# Patient Record
Sex: Male | Born: 1935 | Race: Black or African American | Hispanic: No | Smoking: Never smoker
Health system: Southern US, Community
[De-identification: ages and names within clinical notes are randomized; demographics above are authoritative.]

## PROBLEM LIST (undated history)

## (undated) DIAGNOSIS — E785 Hyperlipidemia, unspecified: Secondary | ICD-10-CM

## (undated) DIAGNOSIS — K219 Gastro-esophageal reflux disease without esophagitis: Secondary | ICD-10-CM

## (undated) DIAGNOSIS — J189 Pneumonia, unspecified organism: Secondary | ICD-10-CM

## (undated) DIAGNOSIS — R972 Elevated prostate specific antigen [PSA]: Secondary | ICD-10-CM

## (undated) DIAGNOSIS — E538 Deficiency of other specified B group vitamins: Secondary | ICD-10-CM

## (undated) DIAGNOSIS — D649 Anemia, unspecified: Secondary | ICD-10-CM

## (undated) DIAGNOSIS — N189 Chronic kidney disease, unspecified: Secondary | ICD-10-CM

## (undated) DIAGNOSIS — M545 Low back pain, unspecified: Secondary | ICD-10-CM

## (undated) DIAGNOSIS — R600 Localized edema: Secondary | ICD-10-CM

## (undated) DIAGNOSIS — M1990 Unspecified osteoarthritis, unspecified site: Secondary | ICD-10-CM

## (undated) DIAGNOSIS — E1039 Type 1 diabetes mellitus with other diabetic ophthalmic complication: Secondary | ICD-10-CM

## (undated) DIAGNOSIS — N400 Enlarged prostate without lower urinary tract symptoms: Secondary | ICD-10-CM

## (undated) DIAGNOSIS — N390 Urinary tract infection, site not specified: Secondary | ICD-10-CM

## (undated) DIAGNOSIS — K769 Liver disease, unspecified: Secondary | ICD-10-CM

## (undated) DIAGNOSIS — K3184 Gastroparesis: Secondary | ICD-10-CM

## (undated) DIAGNOSIS — I10 Essential (primary) hypertension: Secondary | ICD-10-CM

## (undated) DIAGNOSIS — I639 Cerebral infarction, unspecified: Secondary | ICD-10-CM

## (undated) DIAGNOSIS — G9341 Metabolic encephalopathy: Secondary | ICD-10-CM

## (undated) DIAGNOSIS — R627 Adult failure to thrive: Secondary | ICD-10-CM

## (undated) DIAGNOSIS — F329 Major depressive disorder, single episode, unspecified: Secondary | ICD-10-CM

## (undated) DIAGNOSIS — F419 Anxiety disorder, unspecified: Secondary | ICD-10-CM

## (undated) DIAGNOSIS — R4182 Altered mental status, unspecified: Secondary | ICD-10-CM

## (undated) DIAGNOSIS — E871 Hypo-osmolality and hyponatremia: Secondary | ICD-10-CM

## (undated) DIAGNOSIS — I2510 Atherosclerotic heart disease of native coronary artery without angina pectoris: Secondary | ICD-10-CM

## (undated) HISTORY — DX: Gastro-esophageal reflux disease without esophagitis: K21.9

## (undated) HISTORY — DX: Low back pain, unspecified: M54.50

## (undated) HISTORY — DX: Anemia, unspecified: D64.9

## (undated) HISTORY — DX: Elevated prostate specific antigen (PSA): R97.20

## (undated) HISTORY — DX: Gastroparesis: K31.84

## (undated) HISTORY — DX: Low back pain: M54.5

## (undated) HISTORY — DX: Deficiency of other specified B group vitamins: E53.8

## (undated) HISTORY — DX: Essential (primary) hypertension: I10

## (undated) HISTORY — DX: Unspecified osteoarthritis, unspecified site: M19.90

## (undated) HISTORY — DX: Type 1 diabetes mellitus with other diabetic ophthalmic complication: E10.39

## (undated) HISTORY — DX: Liver disease, unspecified: K76.9

## (undated) HISTORY — DX: Benign prostatic hyperplasia without lower urinary tract symptoms: N40.0

## (undated) HISTORY — DX: Major depressive disorder, single episode, unspecified: F32.9

## (undated) HISTORY — DX: Cerebral infarction, unspecified: I63.9

## (undated) HISTORY — PX: TRANSURETHRAL RESECTION OF PROSTATE: SHX73

---

## 1999-05-18 ENCOUNTER — Encounter: Payer: Self-pay | Admitting: Urology

## 1999-05-20 ENCOUNTER — Observation Stay (HOSPITAL_COMMUNITY): Admission: RE | Admit: 1999-05-20 | Discharge: 1999-05-21 | Payer: Self-pay | Admitting: Urology

## 1999-05-20 ENCOUNTER — Encounter (INDEPENDENT_AMBULATORY_CARE_PROVIDER_SITE_OTHER): Payer: Self-pay | Admitting: Specialist

## 1999-05-29 ENCOUNTER — Emergency Department (HOSPITAL_COMMUNITY): Admission: EM | Admit: 1999-05-29 | Discharge: 1999-05-29 | Payer: Self-pay | Admitting: Emergency Medicine

## 2000-10-26 ENCOUNTER — Encounter: Admission: RE | Admit: 2000-10-26 | Discharge: 2001-01-24 | Payer: Self-pay | Admitting: Endocrinology

## 2001-04-20 ENCOUNTER — Encounter: Payer: Self-pay | Admitting: Endocrinology

## 2001-04-20 ENCOUNTER — Ambulatory Visit (HOSPITAL_COMMUNITY): Admission: RE | Admit: 2001-04-20 | Discharge: 2001-04-20 | Payer: Self-pay | Admitting: Endocrinology

## 2002-11-21 ENCOUNTER — Ambulatory Visit (HOSPITAL_COMMUNITY): Admission: RE | Admit: 2002-11-21 | Discharge: 2002-11-21 | Payer: Self-pay | Admitting: Endocrinology

## 2002-11-21 ENCOUNTER — Encounter: Payer: Self-pay | Admitting: Endocrinology

## 2004-04-03 ENCOUNTER — Ambulatory Visit: Payer: Self-pay | Admitting: Internal Medicine

## 2004-08-07 ENCOUNTER — Ambulatory Visit: Payer: Self-pay | Admitting: Endocrinology

## 2004-08-14 ENCOUNTER — Ambulatory Visit: Payer: Self-pay | Admitting: Endocrinology

## 2004-09-21 ENCOUNTER — Ambulatory Visit: Payer: Self-pay | Admitting: Endocrinology

## 2004-10-14 ENCOUNTER — Ambulatory Visit: Payer: Self-pay | Admitting: Endocrinology

## 2004-10-23 ENCOUNTER — Ambulatory Visit: Payer: Self-pay | Admitting: Endocrinology

## 2004-12-22 ENCOUNTER — Ambulatory Visit: Payer: Self-pay | Admitting: Endocrinology

## 2005-03-20 ENCOUNTER — Ambulatory Visit: Payer: Self-pay | Admitting: Internal Medicine

## 2005-06-10 ENCOUNTER — Ambulatory Visit: Payer: Self-pay | Admitting: Internal Medicine

## 2005-07-02 ENCOUNTER — Encounter: Payer: Self-pay | Admitting: Internal Medicine

## 2005-07-02 ENCOUNTER — Ambulatory Visit: Payer: Self-pay

## 2005-10-08 ENCOUNTER — Ambulatory Visit: Payer: Self-pay | Admitting: Endocrinology

## 2005-10-13 ENCOUNTER — Ambulatory Visit: Payer: Self-pay | Admitting: Endocrinology

## 2005-11-22 ENCOUNTER — Ambulatory Visit: Payer: Self-pay | Admitting: Internal Medicine

## 2005-12-23 ENCOUNTER — Ambulatory Visit: Payer: Self-pay | Admitting: Internal Medicine

## 2006-01-07 ENCOUNTER — Ambulatory Visit: Payer: Self-pay | Admitting: Endocrinology

## 2006-01-12 ENCOUNTER — Ambulatory Visit: Payer: Self-pay | Admitting: Endocrinology

## 2006-02-09 ENCOUNTER — Ambulatory Visit: Payer: Self-pay | Admitting: Endocrinology

## 2006-06-13 ENCOUNTER — Ambulatory Visit: Payer: Self-pay | Admitting: Endocrinology

## 2006-06-13 LAB — CONVERTED CEMR LAB
Hgb A1c MFr Bld: 7.4 % — ABNORMAL HIGH (ref 4.6–6.0)
Uric Acid, Serum: 5.8 mg/dL (ref 2.4–7.0)

## 2006-10-04 ENCOUNTER — Ambulatory Visit: Payer: Self-pay | Admitting: Endocrinology

## 2006-10-28 ENCOUNTER — Ambulatory Visit: Payer: Self-pay | Admitting: Endocrinology

## 2006-10-28 LAB — CONVERTED CEMR LAB
ALT: 37 units/L (ref 0–53)
Bilirubin Urine: NEGATIVE
Bilirubin, Direct: 0.2 mg/dL (ref 0.0–0.3)
Calcium: 9.1 mg/dL (ref 8.4–10.5)
Creatinine,U: 124.4 mg/dL
Eosinophils Absolute: 0.1 10*3/uL (ref 0.0–0.6)
Eosinophils Relative: 2 % (ref 0.0–5.0)
GFR calc Af Amer: 58 mL/min
GFR calc non Af Amer: 48 mL/min
Glucose, Bld: 174 mg/dL — ABNORMAL HIGH (ref 70–99)
HDL: 31.9 mg/dL — ABNORMAL LOW (ref >39.0)
Hemoglobin: 12.5 g/dL — ABNORMAL LOW (ref 13.0–17.0)
Hgb A1c MFr Bld: 9.5 % — ABNORMAL HIGH (ref 4.6–6.0)
Lymphocytes Relative: 40.7 % (ref 12.0–46.0)
MCV: 94.9 fL (ref 78.0–100.0)
Microalb Creat Ratio: 4.8 mg/g (ref 0.0–30.0)
Microalb, Ur: 0.6 mg/dL (ref 0.0–1.9)
Monocytes Absolute: 0.1 10*3/uL — ABNORMAL LOW (ref 0.2–0.7)
Neutro Abs: 2.3 10*3/uL (ref 1.4–7.7)
Platelets: 169 10*3/uL (ref 150–400)
Potassium: 3.9 meq/L (ref 3.5–5.1)
Sodium: 138 meq/L (ref 135–145)
Total Protein: 7 g/dL (ref 6.0–8.3)
Triglycerides: 386 mg/dL (ref 0–149)
Urine Glucose: NEGATIVE mg/dL
WBC: 4.3 10*3/uL — ABNORMAL LOW (ref 4.5–10.5)

## 2006-11-03 ENCOUNTER — Ambulatory Visit: Payer: Self-pay | Admitting: Endocrinology

## 2006-11-03 LAB — CONVERTED CEMR LAB
Eosinophils Absolute: 0.1 10*3/uL (ref 0.0–0.6)
Hemoglobin: 10.8 g/dL — ABNORMAL LOW (ref 13.0–17.0)
Iron: 62 ug/dL (ref 42–165)
Lymphocytes Relative: 42.3 % (ref 12.0–46.0)
MCHC: 35 g/dL (ref 30.0–36.0)
MCV: 95.7 fL (ref 78.0–100.0)
Monocytes Absolute: 0.1 10*3/uL — ABNORMAL LOW (ref 0.2–0.7)
Monocytes Relative: 2.6 % — ABNORMAL LOW (ref 3.0–11.0)
Neutro Abs: 2.1 10*3/uL (ref 1.4–7.7)
Platelets: 143 10*3/uL — ABNORMAL LOW (ref 150–400)
Transferrin: 168.6 mg/dL — ABNORMAL LOW (ref >=212.0)

## 2006-12-12 ENCOUNTER — Ambulatory Visit: Payer: Self-pay | Admitting: Endocrinology

## 2007-01-31 ENCOUNTER — Ambulatory Visit: Payer: Self-pay | Admitting: Endocrinology

## 2007-02-17 ENCOUNTER — Encounter: Payer: Self-pay | Admitting: Endocrinology

## 2007-02-17 DIAGNOSIS — D649 Anemia, unspecified: Secondary | ICD-10-CM

## 2007-02-17 DIAGNOSIS — J309 Allergic rhinitis, unspecified: Secondary | ICD-10-CM | POA: Insufficient documentation

## 2007-02-17 DIAGNOSIS — K219 Gastro-esophageal reflux disease without esophagitis: Secondary | ICD-10-CM

## 2007-02-17 DIAGNOSIS — K3184 Gastroparesis: Secondary | ICD-10-CM | POA: Insufficient documentation

## 2007-02-17 DIAGNOSIS — I10 Essential (primary) hypertension: Secondary | ICD-10-CM

## 2007-02-17 DIAGNOSIS — R7989 Other specified abnormal findings of blood chemistry: Secondary | ICD-10-CM | POA: Insufficient documentation

## 2007-02-17 DIAGNOSIS — N400 Enlarged prostate without lower urinary tract symptoms: Secondary | ICD-10-CM | POA: Insufficient documentation

## 2007-02-17 DIAGNOSIS — E785 Hyperlipidemia, unspecified: Secondary | ICD-10-CM | POA: Insufficient documentation

## 2007-02-17 HISTORY — DX: Essential (primary) hypertension: I10

## 2007-02-17 HISTORY — DX: Anemia, unspecified: D64.9

## 2007-02-17 HISTORY — DX: Gastro-esophageal reflux disease without esophagitis: K21.9

## 2007-03-20 ENCOUNTER — Ambulatory Visit: Payer: Self-pay | Admitting: Endocrinology

## 2007-04-18 ENCOUNTER — Ambulatory Visit: Payer: Self-pay | Admitting: Endocrinology

## 2007-04-18 DIAGNOSIS — R05 Cough: Secondary | ICD-10-CM

## 2007-04-27 ENCOUNTER — Ambulatory Visit: Payer: Self-pay | Admitting: Endocrinology

## 2007-06-28 ENCOUNTER — Ambulatory Visit: Payer: Self-pay | Admitting: Endocrinology

## 2007-07-31 ENCOUNTER — Emergency Department (HOSPITAL_COMMUNITY): Admission: EM | Admit: 2007-07-31 | Discharge: 2007-07-31 | Payer: Self-pay | Admitting: Emergency Medicine

## 2007-08-11 ENCOUNTER — Ambulatory Visit: Payer: Self-pay | Admitting: Endocrinology

## 2007-08-18 ENCOUNTER — Ambulatory Visit: Payer: Self-pay | Admitting: Endocrinology

## 2007-08-18 DIAGNOSIS — M25569 Pain in unspecified knee: Secondary | ICD-10-CM

## 2007-09-26 ENCOUNTER — Ambulatory Visit: Payer: Self-pay | Admitting: Endocrinology

## 2007-10-26 ENCOUNTER — Ambulatory Visit: Payer: Self-pay | Admitting: Endocrinology

## 2007-11-22 ENCOUNTER — Encounter: Payer: Self-pay | Admitting: Endocrinology

## 2007-12-26 ENCOUNTER — Ambulatory Visit: Payer: Self-pay | Admitting: Endocrinology

## 2007-12-26 DIAGNOSIS — M79609 Pain in unspecified limb: Secondary | ICD-10-CM | POA: Insufficient documentation

## 2007-12-28 ENCOUNTER — Encounter (INDEPENDENT_AMBULATORY_CARE_PROVIDER_SITE_OTHER): Payer: Self-pay | Admitting: *Deleted

## 2007-12-28 ENCOUNTER — Telehealth: Payer: Self-pay | Admitting: Endocrinology

## 2008-01-03 ENCOUNTER — Encounter: Payer: Self-pay | Admitting: Endocrinology

## 2008-01-03 ENCOUNTER — Ambulatory Visit: Payer: Self-pay

## 2008-02-26 ENCOUNTER — Ambulatory Visit: Payer: Self-pay | Admitting: Endocrinology

## 2008-04-04 ENCOUNTER — Ambulatory Visit: Payer: Self-pay | Admitting: Endocrinology

## 2008-04-29 ENCOUNTER — Telehealth (INDEPENDENT_AMBULATORY_CARE_PROVIDER_SITE_OTHER): Payer: Self-pay | Admitting: *Deleted

## 2008-06-06 ENCOUNTER — Telehealth (INDEPENDENT_AMBULATORY_CARE_PROVIDER_SITE_OTHER): Payer: Self-pay | Admitting: *Deleted

## 2008-06-26 ENCOUNTER — Encounter: Payer: Self-pay | Admitting: Endocrinology

## 2008-07-08 ENCOUNTER — Ambulatory Visit: Payer: Self-pay | Admitting: Endocrinology

## 2008-08-01 ENCOUNTER — Encounter: Payer: Self-pay | Admitting: Endocrinology

## 2008-08-02 ENCOUNTER — Telehealth: Payer: Self-pay | Admitting: Endocrinology

## 2008-08-07 ENCOUNTER — Ambulatory Visit: Payer: Self-pay | Admitting: Endocrinology

## 2008-09-02 ENCOUNTER — Encounter: Payer: Self-pay | Admitting: Endocrinology

## 2008-10-03 ENCOUNTER — Ambulatory Visit: Payer: Self-pay | Admitting: Internal Medicine

## 2008-10-03 DIAGNOSIS — R300 Dysuria: Secondary | ICD-10-CM

## 2008-10-03 DIAGNOSIS — B3749 Other urogenital candidiasis: Secondary | ICD-10-CM

## 2008-10-03 LAB — CONVERTED CEMR LAB
Albumin: 3.6 g/dL (ref 3.5–5.2)
BUN: 14 mg/dL (ref 6–23)
Basophils Absolute: 0 10*3/uL (ref 0.0–0.1)
CO2: 25 meq/L (ref 19–32)
Calcium: 9 mg/dL (ref 8.4–10.5)
Eosinophils Absolute: 0.1 10*3/uL (ref 0.0–0.7)
Glucose, Bld: 179 mg/dL — ABNORMAL HIGH (ref 70–99)
Glucose, Urine, Semiquant: >=1000
HCT: 37.5 % — ABNORMAL LOW (ref 39.0–52.0)
Hemoglobin: 13 g/dL (ref 13.0–17.0)
Hgb A1c MFr Bld: 10.1 % — ABNORMAL HIGH (ref 4.6–6.5)
Iron: 65 ug/dL (ref 42–165)
Lymphs Abs: 1.5 10*3/uL (ref 0.7–4.0)
MCHC: 34.6 g/dL (ref 30.0–36.0)
Neutro Abs: 2.5 10*3/uL (ref 1.4–7.7)
Nitrite: NEGATIVE
PSA: 4.01 ng/mL — ABNORMAL HIGH (ref 0.10–4.00)
Potassium: 3.7 meq/L (ref 3.5–5.1)
RDW: 12.8 % (ref 11.5–14.6)
Sodium: 138 meq/L (ref 135–145)
TSH: 2.13 microintl units/mL (ref 0.35–5.50)
Urine Glucose: >=1000 mg/dL
Urobilinogen, UA: 0.2
Urobilinogen, UA: 0.2 (ref 0.0–1.0)
WBC Urine, dipstick: NEGATIVE

## 2008-10-04 ENCOUNTER — Encounter: Payer: Self-pay | Admitting: Internal Medicine

## 2008-10-08 ENCOUNTER — Ambulatory Visit: Payer: Self-pay | Admitting: Endocrinology

## 2008-10-08 DIAGNOSIS — D61818 Other pancytopenia: Secondary | ICD-10-CM | POA: Insufficient documentation

## 2008-10-08 DIAGNOSIS — E1065 Type 1 diabetes mellitus with hyperglycemia: Secondary | ICD-10-CM

## 2008-10-08 DIAGNOSIS — R972 Elevated prostate specific antigen [PSA]: Secondary | ICD-10-CM | POA: Insufficient documentation

## 2008-10-08 DIAGNOSIS — E1039 Type 1 diabetes mellitus with other diabetic ophthalmic complication: Secondary | ICD-10-CM

## 2008-10-18 ENCOUNTER — Telehealth: Payer: Self-pay | Admitting: Endocrinology

## 2008-10-24 ENCOUNTER — Ambulatory Visit: Payer: Self-pay | Admitting: Endocrinology

## 2008-10-24 DIAGNOSIS — E538 Deficiency of other specified B group vitamins: Secondary | ICD-10-CM | POA: Insufficient documentation

## 2008-11-01 ENCOUNTER — Encounter: Payer: Self-pay | Admitting: Endocrinology

## 2008-11-11 ENCOUNTER — Encounter: Payer: Self-pay | Admitting: Endocrinology

## 2008-11-17 ENCOUNTER — Encounter: Payer: Self-pay | Admitting: Endocrinology

## 2008-11-20 ENCOUNTER — Encounter: Payer: Self-pay | Admitting: Endocrinology

## 2008-11-21 ENCOUNTER — Encounter: Payer: Self-pay | Admitting: Endocrinology

## 2008-12-24 ENCOUNTER — Emergency Department (HOSPITAL_COMMUNITY): Admission: EM | Admit: 2008-12-24 | Discharge: 2008-12-24 | Payer: Self-pay | Admitting: Emergency Medicine

## 2008-12-25 ENCOUNTER — Ambulatory Visit: Payer: Self-pay | Admitting: Internal Medicine

## 2008-12-25 DIAGNOSIS — F3289 Other specified depressive episodes: Secondary | ICD-10-CM

## 2008-12-25 DIAGNOSIS — F329 Major depressive disorder, single episode, unspecified: Secondary | ICD-10-CM | POA: Insufficient documentation

## 2008-12-25 DIAGNOSIS — R040 Epistaxis: Secondary | ICD-10-CM | POA: Insufficient documentation

## 2008-12-25 DIAGNOSIS — R634 Abnormal weight loss: Secondary | ICD-10-CM | POA: Insufficient documentation

## 2008-12-25 HISTORY — DX: Other specified depressive episodes: F32.89

## 2008-12-25 HISTORY — DX: Major depressive disorder, single episode, unspecified: F32.9

## 2008-12-26 LAB — CONVERTED CEMR LAB
ALT: 27 units/L (ref 0–53)
AST: 32 units/L (ref 0–37)
Albumin: 3.6 g/dL (ref 3.5–5.2)
Alkaline Phosphatase: 85 units/L (ref 39–117)
Basophils Relative: 0.8 % (ref 0.0–3.0)
CO2: 27 meq/L (ref 19–32)
Calcium: 9 mg/dL (ref 8.4–10.5)
Eosinophils Absolute: 0 10*3/uL (ref 0.0–0.7)
Eosinophils Relative: 1.2 % (ref 0.0–5.0)
Hemoglobin: 12.4 g/dL — ABNORMAL LOW (ref 13.0–17.0)
Ketones, ur: NEGATIVE mg/dL
Lymphocytes Relative: 41.3 % (ref 12.0–46.0)
MCHC: 33.9 g/dL (ref 30.0–36.0)
Monocytes Relative: 2.1 % — ABNORMAL LOW (ref 3.0–12.0)
Neutro Abs: 2.1 10*3/uL (ref 1.4–7.7)
RBC: 3.72 M/uL — ABNORMAL LOW (ref 4.22–5.81)
Sodium: 135 meq/L (ref 135–145)
Specific Gravity, Urine: 1.015 (ref 1.000–1.030)
Total Protein, Urine: NEGATIVE mg/dL
Total Protein: 7.3 g/dL (ref 6.0–8.3)
Urine Glucose: >=1000 mg/dL
pH: 5.5 (ref 5.0–8.0)

## 2008-12-30 ENCOUNTER — Ambulatory Visit: Payer: Self-pay | Admitting: Endocrinology

## 2008-12-30 DIAGNOSIS — R9431 Abnormal electrocardiogram [ECG] [EKG]: Secondary | ICD-10-CM

## 2008-12-30 DIAGNOSIS — M25529 Pain in unspecified elbow: Secondary | ICD-10-CM

## 2009-01-08 ENCOUNTER — Encounter: Payer: Self-pay | Admitting: Endocrinology

## 2009-01-08 ENCOUNTER — Ambulatory Visit: Payer: Self-pay | Admitting: Endocrinology

## 2009-01-08 ENCOUNTER — Ambulatory Visit: Payer: Self-pay

## 2009-01-08 DIAGNOSIS — R55 Syncope and collapse: Secondary | ICD-10-CM | POA: Insufficient documentation

## 2009-01-13 ENCOUNTER — Emergency Department (HOSPITAL_COMMUNITY): Admission: EM | Admit: 2009-01-13 | Discharge: 2009-01-14 | Payer: Self-pay | Admitting: Emergency Medicine

## 2009-01-31 ENCOUNTER — Ambulatory Visit: Payer: Self-pay | Admitting: Endocrinology

## 2009-01-31 DIAGNOSIS — R109 Unspecified abdominal pain: Secondary | ICD-10-CM | POA: Insufficient documentation

## 2009-01-31 LAB — CONVERTED CEMR LAB: Rapid Strep: NEGATIVE

## 2009-02-03 LAB — CONVERTED CEMR LAB
Bilirubin Urine: NEGATIVE
Eosinophils Relative: 1.7 % (ref 0.0–5.0)
HCT: 40.6 % (ref 39.0–52.0)
Hemoglobin: 13.8 g/dL (ref 13.0–17.0)
Leukocytes, UA: NEGATIVE
Lymphocytes Relative: 38 % (ref 12.0–46.0)
Lymphs Abs: 1.5 10*3/uL (ref 0.7–4.0)
Monocytes Relative: 1.9 % — ABNORMAL LOW (ref 3.0–12.0)
Nitrite: NEGATIVE
Platelets: 196 10*3/uL (ref 150.0–400.0)
Specific Gravity, Urine: 1.015 (ref 1.000–1.030)
Total Protein, Urine: NEGATIVE mg/dL
WBC: 4 10*3/uL — ABNORMAL LOW (ref 4.5–10.5)
pH: 5.5 (ref 5.0–8.0)

## 2009-02-08 ENCOUNTER — Ambulatory Visit: Payer: Self-pay | Admitting: Internal Medicine

## 2009-02-08 DIAGNOSIS — J029 Acute pharyngitis, unspecified: Secondary | ICD-10-CM

## 2009-02-09 ENCOUNTER — Emergency Department (HOSPITAL_COMMUNITY): Admission: EM | Admit: 2009-02-09 | Discharge: 2009-02-09 | Payer: Self-pay | Admitting: Emergency Medicine

## 2009-02-28 ENCOUNTER — Ambulatory Visit: Payer: Self-pay | Admitting: Endocrinology

## 2009-03-17 ENCOUNTER — Ambulatory Visit: Payer: Self-pay | Admitting: Endocrinology

## 2009-03-17 LAB — CONVERTED CEMR LAB: Hgb A1c MFr Bld: 9.6 % — ABNORMAL HIGH (ref 4.6–6.5)

## 2009-04-04 ENCOUNTER — Telehealth: Payer: Self-pay | Admitting: Internal Medicine

## 2009-04-11 ENCOUNTER — Telehealth: Payer: Self-pay | Admitting: Family Medicine

## 2009-04-14 ENCOUNTER — Telehealth: Payer: Self-pay | Admitting: Endocrinology

## 2009-05-27 ENCOUNTER — Telehealth (INDEPENDENT_AMBULATORY_CARE_PROVIDER_SITE_OTHER): Payer: Self-pay | Admitting: *Deleted

## 2009-05-28 ENCOUNTER — Encounter: Payer: Self-pay | Admitting: Endocrinology

## 2009-06-16 ENCOUNTER — Ambulatory Visit: Payer: Self-pay | Admitting: Endocrinology

## 2009-06-25 ENCOUNTER — Telehealth: Payer: Self-pay | Admitting: Endocrinology

## 2009-09-09 ENCOUNTER — Ambulatory Visit: Payer: Self-pay | Admitting: Endocrinology

## 2009-09-09 DIAGNOSIS — M545 Low back pain: Secondary | ICD-10-CM

## 2009-09-09 LAB — CONVERTED CEMR LAB: Hgb A1c MFr Bld: 11.6 % — ABNORMAL HIGH

## 2009-11-20 ENCOUNTER — Encounter: Payer: Self-pay | Admitting: Endocrinology

## 2009-11-20 LAB — HM DIABETES EYE EXAM

## 2009-12-09 ENCOUNTER — Ambulatory Visit: Payer: Self-pay | Admitting: Endocrinology

## 2009-12-09 DIAGNOSIS — K769 Liver disease, unspecified: Secondary | ICD-10-CM | POA: Insufficient documentation

## 2009-12-09 LAB — CONVERTED CEMR LAB
ALT: 61 units/L — ABNORMAL HIGH (ref 0–53)
AST: 90 units/L — ABNORMAL HIGH (ref 0–37)
Basophils Relative: 0.5 % (ref 0.0–3.0)
Chloride: 104 meq/L (ref 96–112)
Cholesterol: 101 mg/dL (ref 0–200)
Eosinophils Relative: 2.1 % (ref 0.0–5.0)
GFR calc non Af Amer: 85.62 mL/min (ref >60)
HCT: 41.7 % (ref 39.0–52.0)
Hemoglobin: 14.4 g/dL (ref 13.0–17.0)
Hgb A1c MFr Bld: 11.2 % — ABNORMAL HIGH (ref 4.6–6.5)
LDL Cholesterol: 57 mg/dL (ref 0–99)
Leukocytes, UA: NEGATIVE
Lymphs Abs: 1.6 10*3/uL (ref 0.7–4.0)
MCV: 97.1 fL (ref 78.0–100.0)
Microalb Creat Ratio: 12 mg/g (ref 0.0–30.0)
Monocytes Absolute: 0.1 10*3/uL (ref 0.1–1.0)
Monocytes Relative: 2.1 % — ABNORMAL LOW (ref 3.0–12.0)
PSA: 2.94 ng/mL (ref 0.10–4.00)
Platelets: 153 10*3/uL (ref 150.0–400.0)
Potassium: 4 meq/L (ref 3.5–5.1)
RBC: 4.29 M/uL (ref 4.22–5.81)
Sodium: 138 meq/L (ref 135–145)
Specific Gravity, Urine: 1.02 (ref 1.000–1.030)
TSH: 1.8 microintl units/mL (ref 0.35–5.50)
Total Bilirubin: 0.9 mg/dL (ref 0.3–1.2)
Total CHOL/HDL Ratio: 3
Total Protein: 6.9 g/dL (ref 6.0–8.3)
Uric Acid, Serum: 4.6 mg/dL (ref 4.0–7.8)
Urobilinogen, UA: 0.2 (ref 0.0–1.0)
VLDL: 8 mg/dL (ref 0.0–40.0)
WBC: 3.9 10*3/uL — ABNORMAL LOW (ref 4.5–10.5)
pH: 5.5 (ref 5.0–8.0)

## 2010-01-02 ENCOUNTER — Ambulatory Visit: Payer: Self-pay | Admitting: Endocrinology

## 2010-01-08 ENCOUNTER — Encounter: Payer: Self-pay | Admitting: Endocrinology

## 2010-01-08 ENCOUNTER — Ambulatory Visit: Payer: Self-pay | Admitting: Endocrinology

## 2010-01-08 DIAGNOSIS — R319 Hematuria, unspecified: Secondary | ICD-10-CM

## 2010-01-08 LAB — CONVERTED CEMR LAB: HCV Ab: NEGATIVE

## 2010-01-18 ENCOUNTER — Emergency Department (HOSPITAL_COMMUNITY): Admission: EM | Admit: 2010-01-18 | Discharge: 2010-01-18 | Payer: Self-pay | Admitting: Emergency Medicine

## 2010-02-05 ENCOUNTER — Encounter: Payer: Self-pay | Admitting: Endocrinology

## 2010-02-16 ENCOUNTER — Ambulatory Visit: Payer: Self-pay | Admitting: Endocrinology

## 2010-03-04 ENCOUNTER — Ambulatory Visit: Payer: Self-pay | Admitting: Endocrinology

## 2010-03-04 DIAGNOSIS — M1990 Unspecified osteoarthritis, unspecified site: Secondary | ICD-10-CM | POA: Insufficient documentation

## 2010-03-04 HISTORY — DX: Unspecified osteoarthritis, unspecified site: M19.90

## 2010-03-04 LAB — CONVERTED CEMR LAB: Hgb A1c MFr Bld: 10.5 % — ABNORMAL HIGH (ref 4.6–6.5)

## 2010-03-09 ENCOUNTER — Encounter: Payer: Self-pay | Admitting: Endocrinology

## 2010-03-26 ENCOUNTER — Encounter: Payer: Self-pay | Admitting: Endocrinology

## 2010-03-30 ENCOUNTER — Telehealth: Payer: Self-pay | Admitting: Endocrinology

## 2010-04-22 ENCOUNTER — Ambulatory Visit
Admission: RE | Admit: 2010-04-22 | Discharge: 2010-04-22 | Payer: Self-pay | Source: Home / Self Care | Attending: Endocrinology | Admitting: Endocrinology

## 2010-04-30 ENCOUNTER — Encounter: Payer: Self-pay | Admitting: Endocrinology

## 2010-05-01 ENCOUNTER — Ambulatory Visit: Admission: RE | Admit: 2010-05-01 | Discharge: 2010-05-01 | Payer: Self-pay | Source: Home / Self Care

## 2010-05-01 ENCOUNTER — Encounter: Payer: Self-pay | Admitting: Endocrinology

## 2010-05-07 ENCOUNTER — Encounter: Payer: Self-pay | Admitting: Endocrinology

## 2010-05-08 ENCOUNTER — Ambulatory Visit: Admit: 2010-05-08 | Payer: Self-pay | Admitting: Endocrinology

## 2010-05-15 ENCOUNTER — Other Ambulatory Visit: Payer: Self-pay | Admitting: Endocrinology

## 2010-05-15 ENCOUNTER — Ambulatory Visit
Admission: RE | Admit: 2010-05-15 | Discharge: 2010-05-15 | Payer: Self-pay | Source: Home / Self Care | Attending: Endocrinology | Admitting: Endocrinology

## 2010-05-15 LAB — HEMOGLOBIN A1C: Hgb A1c MFr Bld: 11 % — ABNORMAL HIGH (ref 4.6–6.5)

## 2010-05-17 LAB — CONVERTED CEMR LAB
ALT: 23 units/L (ref 0–53)
ALT: 24 units/L (ref 0–53)
AST: 22 units/L (ref 0–37)
Albumin: 3.5 g/dL (ref 3.5–5.2)
Alkaline Phosphatase: 89 units/L (ref 39–117)
Basophils Absolute: 0 10*3/uL (ref 0.0–0.1)
Bilirubin Urine: NEGATIVE
Bilirubin Urine: NEGATIVE
Bilirubin, Direct: 0.2 mg/dL (ref 0.0–0.3)
CO2: 27 meq/L (ref 19–32)
Calcium: 8.9 mg/dL (ref 8.4–10.5)
Cholesterol: 163 mg/dL (ref 0–200)
Creatinine, Ser: 1.7 mg/dL — ABNORMAL HIGH (ref 0.4–1.5)
Creatinine,U: 118.1 mg/dL
Crystals: NEGATIVE
GFR calc Af Amer: 50 mL/min
GFR calc non Af Amer: 41 mL/min
HCT: 36.9 % — ABNORMAL LOW (ref 39.0–52.0)
HDL: 33.9 mg/dL — ABNORMAL LOW (ref >39.00)
HDL: 43.2 mg/dL (ref >39.0)
Hemoglobin: 12.6 g/dL — ABNORMAL LOW (ref 13.0–17.0)
Ketones, ur: NEGATIVE mg/dL
LDL Cholesterol: 75 mg/dL (ref 0–99)
LDL Cholesterol: 99 mg/dL (ref 0–99)
Leukocytes, UA: NEGATIVE
Leukocytes, UA: NEGATIVE
Lymphocytes Relative: 48.5 % — ABNORMAL HIGH (ref 12.0–46.0)
MCHC: 34.2 g/dL (ref 30.0–36.0)
Microalb Creat Ratio: 21.6 mg/g (ref 0.0–30.0)
Microalb Creat Ratio: 5.1 mg/g (ref 0.0–30.0)
Microalb, Ur: 0.6 mg/dL (ref 0.0–1.9)
Monocytes Absolute: 0.1 10*3/uL (ref 0.1–1.0)
Monocytes Relative: 2.6 % — ABNORMAL LOW (ref 3.0–12.0)
Neutro Abs: 1.9 10*3/uL (ref 1.4–7.7)
Nitrite: NEGATIVE
Platelets: 172 10*3/uL (ref 150–400)
Prolactin: 9.6 ng/mL
RDW: 12.8 % (ref 11.5–14.6)
Sodium: 142 meq/L (ref 135–145)
Total Bilirubin: 1.4 mg/dL — ABNORMAL HIGH (ref 0.3–1.2)
Total CHOL/HDL Ratio: 3.8
Total CHOL/HDL Ratio: 4
Total Protein, Urine: NEGATIVE mg/dL
Total Protein: 7 g/dL (ref 6.0–8.3)
Triglycerides: 106 mg/dL (ref 0–149)
Triglycerides: 73 mg/dL (ref 0.0–149.0)
Uric Acid, Serum: 6.5 mg/dL (ref 4.0–7.8)
Urobilinogen, UA: 0.2 (ref 0.0–1.0)
Urobilinogen, UA: 0.2 (ref 0.0–1.0)
WBC, UA: NONE SEEN cells/hpf

## 2010-05-19 NOTE — Medication Information (Signed)
Summary: Diabetic supplies/[facility]  Diabetic supplies/[facility]   Imported By: Bubba Hales 05/29/2009 07:40:29  _____________________________________________________________________  External Attachment:    Type:   Image     Comment:   External Document

## 2010-05-19 NOTE — Letter (Signed)
Summary: [facility]   Imported By: Phillis Knack 02/12/2010 08:11:13  _____________________________________________________________________  External Attachment:    Type:   Image     Comment:   External Document

## 2010-05-19 NOTE — Assessment & Plan Note (Signed)
Summary: 3 MONTH FU-LB   Vital Signs:  Patient profile:   77 year old male Height:      71 inches (180.34 cm) Weight:      162.25 pounds (73.75 kg) O2 Sat:      98 % on Room air Temp:     96.4 degrees F (35.78 degrees C) oral Pulse rate:   59 / minute BP sitting:   138 / 88  (left arm) Cuff size:   regular  Vitals Entered By: Gardenia Phlegm RMA (June 16, 2009 8:35 AM)  O2 Flow:  Room air CC: 3 month follow up/ CF Is Patient Diabetic? Yes   Primary Provider:  Donavan Foil MD  CC:  3 month follow up/ CF.  History of Present Illness: pt states 1-2 weeks of rash on the foreskin of the penis.  no associated itching. no cbg record, but states cbg's are well-controlled.  he says his cbg is highest at hs and am.  Current Medications (verified): 1)  Aciphex 20 Mg Tbec (Rabeprazole Sodium) .... Take 1 Tablet By Mouth Once A Day 2)  Furosemide 80 Mg Tabs (Furosemide) .... Take 1 By Mouth Qd 3)  Lactulose 10 Gm/38ml Soln (Lactulose) .... Take 15 Ml By Mouth Twice A Day Prn 4)  Lisinopril-Hydrochlorothiazide 20-12.5 Mg Tabs (Lisinopril-Hydrochlorothiazide) .... Take 2 By Mouth Qd 5)  Norvasc 10 Mg  Tabs (Amlodipine Besylate) .... Take 1 By Mouth Qd 6)  Bd Insulin Syringe Ultrafine 31g X 5/16" 0.3 Ml  Misc (Insulin Syringe-Needle U-100) .... Use As Directed 7)  Aspirin Adult Low Strength 81 Mg Tbec (Aspirin) .... Take 1 By Mouth Qd 8)  Cozaar 100 Mg Tabs (Losartan Potassium) .... Qd 9)  Simvastatin 80 Mg Tabs (Simvastatin) .... Qhs 10)  Novolin 70/30 70-30 % Susp (Insulin Isophane & Regular) .... 22 Units Qam and 4 Units Qpm 11)  Lotrimin Af 1 % Crea (Clotrimazole) .... Clean Under Foreskin and Apply To Eip of Penis Two Times A Day For 10 Days 12)  Carvedilol 3.125 Mg Tabs (Carvedilol) .Marland Kitchen.. 1 Bid 13)  Wellbutrin Xl 150 Mg Xr24h-Tab (Bupropion Hcl) .Marland Kitchen.. 1 By Mouth Once Daily (Generic) 14)  Tramadol Hcl 50 Mg Tabs (Tramadol Hcl) .Marland Kitchen.. 1 Q4h As Needed Pain 15)  Bayer Contour Test   Strp (Glucose Blood) .Marland Kitchen.. 1 Two Times A Day, and Lancets 250.01  Allergies (verified): No Known Drug Allergies  Past History:  Past Medical History: Last updated: 02/17/2007 Allergic rhinitis Anemia-NOS Diabetes mellitus, type II GERD Hyperlipidemia Hypertension Benign prostatic hypertrophy  Review of Systems  The patient denies hypoglycemia and fever.    Physical Exam  General:  normal appearance.   Genitalia:  uncircumcised.  there is an eczematous rash at the foreskin Additional Exam:   Hemoglobin A1C       [H]  10.9 %    Impression & Recommendations:  Problem # 1:  DIABETES MELLITUS, TYPE I, CONTROLLED, WITH RETINOPATHY (ICD-250.51) needs increased rx  Medications Added to Medication List This Visit: 1)  Novolin 70/30 70-30 % Susp (Insulin isophane & regular) .... 22 units qam and 8 units qpm 2)  Fluconazole 100 Mg Tabs (Fluconazole) .Marland Kitchen.. 1 once daily, x 2 days  Other Orders: TLB-A1C / Hgb A1C (Glycohemoglobin) (83036-A1C) Est. Patient Level III [id number])  Patient Instructions: 1)  fluconazole 100 mg once daily, x 2 days. 2)  tests are being ordered for you today.  a few days after the test(s), please call [phone] to hear your test  results. 3)  pending the test results, please continue the same medications (except the fluconazole), for now. 4)  while on the fluconazole, do not take the simvastatin, due to a drug interaction. 5)  Please schedule a follow-up appointment in 3 months. 6)  (update: i left message on phone-tree:  increase insulin to 22 units am and 8 units pm.  return 2 weeks with cbg record) Prescriptions: LACTULOSE 10 GM/15ML SOLN (LACTULOSE) Take 15 ml by mouth twice a day prn  #900 Millilit x 11   Entered and Authorized by:   Donavan Foil MD   Signed by:   Donavan Foil MD on 06/16/2009   Method used:   Electronically to        [facility] [phone]* (retail)       [place], [place]   [id number]       Ph: [phone] or [phone]       Fax: [phone]   RxID:   [id number] FLUCONAZOLE 100 MG TABS (FLUCONAZOLE) 1 once daily, x 2 days  #2 x 4   Entered and Authorized by:   Donavan Foil MD   Signed by:   Donavan Foil MD on 06/16/2009   Method used:   Electronically to        [facility] [phone]* (retail)       [place], [place]  [id number]       Ph: [phone] or [phone]       Fax: [phone]   RxID:   [id number]   Appended Document: Orders Update     Clinical Lists Changes  Orders: Added new Service order of Est. Patient Level IV [id number]) - Signed

## 2010-05-19 NOTE — Assessment & Plan Note (Signed)
Summary: GOUT? COMING 9-22 FOR YEARLY FU/NWS   Vital Signs:  Patient profile:   77 year old male Height:      71 inches (180.34 cm) Weight:      157 pounds (71.36 kg) BMI:     21.98 O2 Sat:      99 % on Room air Temp:     97.4 degrees F (36.33 degrees C) oral Pulse rate:   53 / minute BP sitting:   116 / 72  (left arm) Cuff size:   regular  Vitals Entered By: Rebeca Alert MA (January 02, 2010 10:47 AM)  O2 Flow:  Room air CC: gout sx/pain in toes, swollen feet/aj Is Patient Diabetic? Yes   Primary Provider:  Donavan Foil MD  CC:  gout sx/pain in toes and swollen feet/aj.  History of Present Illness: pt states few days of mod pain at both feet, and assoc swelling.  it is worst at both great toe mtp areas.    Current Medications (verified): 1)  Aciphex 20 Mg Tbec (Rabeprazole Sodium) .... Take 1 Tablet By Mouth Once A Day 2)  Furosemide 80 Mg Tabs (Furosemide) .... Take 1 By Mouth Qd 3)  Lactulose 10 Gm/92ml Soln (Lactulose) .... Take 15 Ml By Mouth Twice A Day Prn 4)  Lisinopril-Hydrochlorothiazide 20-12.5 Mg Tabs (Lisinopril-Hydrochlorothiazide) .... Take 2 By Mouth Qd 5)  Norvasc 10 Mg  Tabs (Amlodipine Besylate) .... Take 1 By Mouth Qd 6)  Bd Insulin Syringe Ultrafine 31g X 5/16" 0.3 Ml  Misc (Insulin Syringe-Needle U-100) .... Use As Directed 7)  Aspirin Adult Low Strength 81 Mg Tbec (Aspirin) .... Take 1 By Mouth Qd 8)  Cozaar 100 Mg Tabs (Losartan Potassium) .... Qd 9)  Novolin 70/30 70-30 % Susp (Insulin Isophane & Regular) .... 25 Units Qam and 10 Units Qpm 10)  Lotrimin Af 1 % Crea (Clotrimazole) .... Clean Under Foreskin and Apply To Eip of Penis Two Times A Day For 10 Days 11)  Carvedilol 3.125 Mg Tabs (Carvedilol) .Marland Kitchen.. 1 Bid 12)  Bayer Contour Test  Strp (Glucose Blood) .Marland Kitchen.. 1 Two Times A Day, and Lancets 250.01  Allergies (verified): No Known Drug Allergies  Past History:  Past Medical History: Last updated: 02/17/2007 Allergic  rhinitis Anemia-NOS Diabetes mellitus, type II GERD Hyperlipidemia Hypertension Benign prostatic hypertrophy  Review of Systems  The patient denies fever.         denies rash  Physical Exam  General:  normal appearance.   Pulses:  dorsalis pedis intact bilat.   Extremities:  no deformity.  no ulcer on the feet.  feet are of normal color and temp.  trace bilat pedal edema, but no erythema/warmth/tenderness.  no calf swelling or tenderness.  mycotic toenails.   Neurologic:  sensation is intact to touch on the feet    Impression & Recommendations:  Problem # 1:  foot pain uncertain etiology  Problem # 2:  HYPERURICEMIA (ICD-790.6) ? related to #1  Problem # 3:  HYPERTENSION (ICD-401.9) well-controlled it appears he can handle a med reduction.  Medications Added to Medication List This Visit: 1)  Lisinopril-hydrochlorothiazide 20-12.5 Mg Tabs (Lisinopril-hydrochlorothiazide) .... Take 1 by mouth once daily 2)  Tramadol Hcl 50 Mg Tabs (Tramadol hcl) .Marland Kitchen.. 1 tab every 4 hrs as needed for pain  Other Orders: Est. Patient Level IV [id number])  Patient Instructions: 1)  reduce lisinopril/hctz to 1 tab once daily. 2)  try tramadol 1 every 4 hrs as needed for pain. 3)  come  back next week for your physical, as scheduled.   Prescriptions: TRAMADOL HCL 50 MG TABS (TRAMADOL HCL) 1 tab every 4 hrs as needed for pain  #50 x 0   Entered and Authorized by:   Donavan Foil MD   Signed by:   Donavan Foil MD on 01/02/2010   Method used:   Electronically to        [facility] [phone]* (retail)       [place], [place]  [id number]       Ph: [phone] or [phone]       Fax: [phone]   RxID:   [id number]

## 2010-05-19 NOTE — Progress Notes (Signed)
Summary: [facility]  Phone Note Outgoing Call   Summary of Call: Received paperwork from [facility] for diabetic testing supplies. Paperwork put on MD's desk. Initial call taken by: Gardenia Phlegm CMA,  May 27, 2009 2:18 PM     Appended Document: [facility] completed paperwork and sent a copy to be scanned.

## 2010-05-19 NOTE — Letter (Signed)
Summary: [facility]   Imported By: Rise Patience 03/13/2010 10:51:19  _____________________________________________________________________  External Attachment:    Type:   Image     Comment:   External Document

## 2010-05-19 NOTE — Assessment & Plan Note (Signed)
Summary: 1 MTH YEARLY---STC   Vital Signs:  Patient profile:   77 year old male Height:      71 inches (180.34 cm) Weight:      155.50 pounds (70.68 kg) BMI:     21.77 O2 Sat:      98 % on Room air Temp:     98.4 degrees F (36.89 degrees C) oral Pulse rate:   67 / minute BP sitting:   122 / 80  (left arm) Cuff size:   regular  Vitals Entered By: Rebeca Alert MA (January 08, 2010 8:37 AM)  O2 Flow:  Room air CC: Yearly Medicare wellness exam/aj Is Patient Diabetic? Yes   Primary Provider:  Donavan Foil MD  CC:  Yearly Medicare wellness exam/aj.  History of Present Illness: here for regular wellness examination.  He's feeling pretty well in general, and does not drink or smoke.   Current Medications (verified): 1)  Aciphex 20 Mg Tbec (Rabeprazole Sodium) .... Take 1 Tablet By Mouth Once A Day 2)  Furosemide 80 Mg Tabs (Furosemide) .... Take 1 By Mouth Qd 3)  Lactulose 10 Gm/56ml Soln (Lactulose) .... Take 15 Ml By Mouth Twice A Day Prn 4)  Lisinopril-Hydrochlorothiazide 20-12.5 Mg Tabs (Lisinopril-Hydrochlorothiazide) .... Take 1 By Mouth Once Daily 5)  Norvasc 10 Mg  Tabs (Amlodipine Besylate) .... Take 1 By Mouth Qd 6)  Bd Insulin Syringe Ultrafine 31g X 5/16" 0.3 Ml  Misc (Insulin Syringe-Needle U-100) .... Use As Directed 7)  Aspirin Adult Low Strength 81 Mg Tbec (Aspirin) .... Take 1 By Mouth Qd 8)  Cozaar 100 Mg Tabs (Losartan Potassium) .... Qd 9)  Novolin 70/30 70-30 % Susp (Insulin Isophane & Regular) .... 25 Units Qam and 10 Units Qpm 10)  Lotrimin Af 1 % Crea (Clotrimazole) .... Clean Under Foreskin and Apply To Eip of Penis Two Times A Day For 10 Days 11)  Carvedilol 3.125 Mg Tabs (Carvedilol) .Marland Kitchen.. 1 Bid 12)  Bayer Contour Test  Strp (Glucose Blood) .Marland Kitchen.. 1 Two Times A Day, and Lancets 250.01 13)  Tramadol Hcl 50 Mg Tabs (Tramadol Hcl) .Marland Kitchen.. 1 Tab Every 4 Hrs As Needed For Pain  Allergies (verified): No Known Drug Allergies  Family History: Reviewed  history from 12/26/2007 and no changes required. father had uncertain type of cancer 2 brothers had prostate cancer  Social History: Reviewed history from 03/17/2009 and no changes required. works Education administrator business widowed 2010  Never Smoked Alcohol use-no Drug use-no Regular exercise-no  Review of Systems  The patient denies fever, weight gain, vision loss, decreased hearing, chest pain, dyspnea on exertion, prolonged cough, headaches, abdominal pain, melena, hematochezia, severe indigestion/heartburn, hematuria, and suspicious skin lesions.    Physical Exam  General:  normal appearance.   Head:  head: no deformity eyes: no periorbital swelling, no proptosis external nose and ears are normal mouth: no lesion seen Neck:  Supple without thyroid enlargement or tenderness.  Lungs:  Clear to auscultation bilaterally. Normal respiratory effort.  Heart:  Regular rate and rhythm without murmurs or gallops noted. Normal S1,S2.   Rectal:  normal external and internal exam.  heme neg  Prostate:  Normal size prostate without masses or tenderness.  Msk:  muscle bulk and strength are grossly normal.  no obvious joint swelling.  gait is normal and steady  Extremities:  no deformity.  no ulcer on the feet.  feet are of normal color and temp, but legs have multiple healed ulcers (hyper- and hypopigmented areas).  trace bilat pedal edema, but no erythema/warmth/tenderness.  no calf swelling or tenderness.  mycotic toenails.   Neurologic:  cn 2-12 grossly intact.   readily moves all 4's.   sensation is intact to touch on the feet  Skin:  normal texture and temp.  no rash.  not diaphoretic  Cervical Nodes:  No significant adenopathy.  Additional Exam:  SEPARATE EVALUATION FOLLOWS--EACH PROBLEM HERE IS NEW, NOT RESPONDING TO TREATMENT, OR POSES SIGNIFICANT RISK TO THE PATIENT'S HEALTH: HISTORY OF THE PRESENT ILLNESS: no cbg record, but states meals and cbg's are extremely irregular pt states  slight depression x 1 year, in the context of being widowed.  he has assoc insomnia PAST MEDICAL HISTORY reviewed and up to date today REVIEW OF SYSTEMS: he has lost 6 lbs since last year.  no syncope PHYSICAL EXAMINATION: abdomen is soft, nontender.  no hepatosplenomegaly.   not distended.  no hernia psych:  does not appear anxious nor depressed dorsalis pedis intact bilat.  no carotid bruit LAB/XRAY RESULTS: transaminases are elevated a1c is elevated IMPRESSION: dm.  he needs a simpler schedule depression, situational. elev hepatic transaminases PLAN: see instruction sheet   Impression & Recommendations:  Problem # 1:  ROUTINE GENERAL MEDICAL EXAM@HEALTH  CARE FACL (ICD-V70.0)  Medications Added to Medication List This Visit: 1)  Humulin N 100 Unit/ml Susp (Insulin isophane human) .... 25 units am and 10 units pm 2)  Citalopram Hydrobromide 20 Mg Tabs (Citalopram hydrobromide) .Marland Kitchen.. 1 tab once daily  Other Orders: T-Hepatitis B Surface Antigen [id number]) T-Hepatitis C Antibody [id number]) EKG w/ Interpretation (93000) Flu Vaccine 71yrs + MEDICARE PATIENTS [id number]) Administration Flu vaccine - MCR (G0008) TLB-Hepatic/Liver Function Pnl (80076-HEPATIC) TLB-Udip w/ Micro (81001-URINE) Est. Patient Level IV [id number]) Est. Patient 65& > [id number])  Patient Instructions: 1)  blood tests are being ordered for you today.  please call [phone] to hear your test results. 2)  change current insulin to nph, 25 units am and 10 units in the evening. 3)  check your blood sugar 2 times a day.  vary the time of day when you check, between before the 3 meals, and at bedtime.  also check if you have symptoms of your blood sugar being too high or too low.  please keep a record of the readings and bring it to your next appointment here.  please call us sooner if you are having low blood sugar episodes. 4)  Please schedule a follow-up appointment in 1 month. 5)  add citalopram 20 mg once  daily. 6)  please consider these measures for your health:  minimize alcohol.  do not use tobacco products.  have a colonoscopy at least every 10 years from age 50.  keep firearms safely stored.  always use seat belts.  have working smoke alarms in your home.  see an eye doctor and dentist regularly.  never drive under the influence of alcohol or drugs (including prescription drugs).   7)  please let me know what your wishes would be, if artificial life support measures should become necessary.  it is critically important to prevent falling down (keep floor areas well-lit, dry, and free of loose objects) 8)  (update:  we discussed code status.  pt requests full code, but would not want to be started or maintained on artificial life-support measures if there was not a reasonable chance of recovery) Prescriptions: CITALOPRAM HYDROBROMIDE 20 MG TABS (CITALOPRAM HYDROBROMIDE) 1 tab once daily  #30 x 11   Entered and Authorized by:  Donavan Foil MD   Signed by:   Donavan Foil MD on 01/08/2010   Method used:   Electronically to        [facility] [phone]* (retail)       [place], [place]  [id number]       Ph: [phone] or [phone]       Fax: [phone]   RxID:   [id number] CITALOPRAM HYDROBROMIDE 20 MG TABS (CITALOPRAM HYDROBROMIDE) 1 tab once daily  #30 x 11   Entered and Authorized by:   Donavan Foil MD   Signed by:   Donavan Foil MD on 01/08/2010   Method used:   Print then Give to Patient   RxID:   [id number] HUMULIN N 100 UNIT/ML SUSP (INSULIN ISOPHANE HUMAN) 25 units am and 10 units pm  #2 vials x 11   Entered and Authorized by:   Donavan Foil MD   Signed by:   Donavan Foil MD on 01/08/2010   Method used:   Print then Give to Patient   RxID:   [id number] HUMULIN N 100 UNIT/ML SUSP (INSULIN ISOPHANE HUMAN) 25 units am and 10 units pm  #2 vials x 11   Entered and Authorized by:   Donavan Foil MD    Signed by:   Donavan Foil MD on 01/08/2010   Method used:   Electronically to        [facility] [phone]* (retail)       [place], [place]  [id number]       Ph: [phone] or [phone]       Fax: [phone]   RxID:   [id number]        Flu Vaccine Consent Questions     Do you have a history of severe allergic reactions to this vaccine? no    Any prior history of allergic reactions to egg and/or gelatin? no    Do you have a sensitivity to the preservative Thimersol? no    Do you have a past history of Guillan-Barre Syndrome? no    Do you currently have an acute febrile illness? no    Have you ever had a severe reaction to latex? no    Vaccine information given and explained to patient? yes    Are you currently pregnant? no    Lot Number:[id number]   Exp Date:10/17/2010   Site Given Right Deltoid IMlu
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
## 2010-05-19 NOTE — Progress Notes (Signed)
Summary: Call Report  Phone Note Other Incoming   Caller: [facility] Call Report Summary of Call: [facility] Triage Call Report Triage Record Num: [id number] Operator: Finis Bud Patient Name: Eric [name] Call Date & Time: 06/24/2009 8:45:23PM Patient Phone: [phone] PCP: Renato Shin Patient Gender: Male PCP Fax : [phone] Patient DOB: Sep 20, 1923 [facility] Name: [facility] Reason for Call: Pt called to report that he is constipated. Last BM 03/06. Home care advice given for constipation. No emergent s/s. Protocol(s) Used: Constipation Recommended Outcome per Protocol: Provide Home/Self Care Reason for Outcome: Increased hardness of stool or difficulty in moving bowels Care Advice:  ~ Call provider if symptoms do not improve or symptoms worsen after trying home care measures. Constipation Care Measures: - Drink 8 to 10 glasses of liquid per day, more if breastfeeding. - Drink warm water or coffee early in the morning. - Gradually increase dietary fiber (fresh fruits/vegetables, whole grain bread and cereals). - As tolerated, walk 30 minutes at a steady pace daily. - Consider OTC stool softeners (Colace) per label, pharmacist or provider recommendations. Stool softners are not habit forming as some stimulant laxatives may become. - Consider OTC bulk forming laxatives (such as Metamucil, FiberCon, Citrucel, etc.); follow package directions. - Avoid routine use of strong laxatives/enemas/suppositories unless ordered by provider. - Do not delay having bowel movement when having urge. - Keep a routine; attempt bowel movement within half hour after a meal or after some exercise.  ~  ~ Decrease intake of binding foods such as cheese, white rice, meats, and bananas. Natural laxatives include figs, prunes, dates, raisins and black licorice. A large serving of these fruits or a glass of prune juice with breakfast each morning for several days may help.  ~ Speak with  provider during regular office hours to review medication(s). Many medications (iron supplements, antidepressants, diuretics, antacids containing calcium) can contribu Initial call taken by: Crissie Sickles, [place],  June 25, 2009 8:51 AM

## 2010-05-19 NOTE — Letter (Signed)
Summary: [facility]   Imported By: Rise Patience 11/28/2009 14:11:06  _____________________________________________________________________  External Attachment:    Type:   Image     Comment:   External Document

## 2010-05-19 NOTE — Letter (Signed)
Summary: [facility]  [facility]   Imported By: Phillis Knack 12/02/2009 10:21:32  _____________________________________________________________________  External Attachment:    Type:   Image     Comment:   External Document

## 2010-05-19 NOTE — Assessment & Plan Note (Signed)
Summary: 3-4 MTH FU  STC   Vital Signs:  Patient profile:   77 year old male Height:      71 inches (180.34 cm) Weight:      154.13 pounds (70.06 kg) BMI:     21.57 O2 Sat:      97 % on Room air Temp:     96.8 degrees F (36 degrees C) oral Pulse rate:   56 / minute BP sitting:   138 / 80  (left arm) Cuff size:   regular  Vitals Entered By: Rebeca Alert MA (December 09, 2009 8:18 AM)  O2 Flow:  Room air CC: 3-4 month F/U/aj Is Patient Diabetic? Yes   Primary Provider:  Donavan Foil MD  CC:  3-4 month F/U/aj.  History of Present Illness: he brings a record of his cbg's which i have reviewed today.  it varies from 93-300, with no trend throughout the day.   pt states few years of moderate pain at the lower back, and assoc pain at the hips. pt says bp is 123XX123 systolic at home.  Current Medications (verified): 1)  Aciphex 20 Mg Tbec (Rabeprazole Sodium) .... Take 1 Tablet By Mouth Once A Day 2)  Furosemide 80 Mg Tabs (Furosemide) .... Take 1 By Mouth Qd 3)  Lactulose 10 Gm/38ml Soln (Lactulose) .... Take 15 Ml By Mouth Twice A Day Prn 4)  Lisinopril-Hydrochlorothiazide 20-12.5 Mg Tabs (Lisinopril-Hydrochlorothiazide) .... Take 2 By Mouth Qd 5)  Norvasc 10 Mg  Tabs (Amlodipine Besylate) .... Take 1 By Mouth Qd 6)  Bd Insulin Syringe Ultrafine 31g X 5/16" 0.3 Ml  Misc (Insulin Syringe-Needle U-100) .... Use As Directed 7)  Aspirin Adult Low Strength 81 Mg Tbec (Aspirin) .... Take 1 By Mouth Qd 8)  Cozaar 100 Mg Tabs (Losartan Potassium) .... Qd 9)  Simvastatin 80 Mg Tabs (Simvastatin) .... Qhs 10)  Novolin 70/30 70-30 % Susp (Insulin Isophane & Regular) .... 22 Units Qam and 8 Units Qpm 11)  Lotrimin Af 1 % Crea (Clotrimazole) .... Clean Under Foreskin and Apply To Eip of Penis Two Times A Day For 10 Days 12)  Carvedilol 3.125 Mg Tabs (Carvedilol) .Marland Kitchen.. 1 Bid 13)  Bayer Contour Test  Strp (Glucose Blood) .Marland Kitchen.. 1 Two Times A Day, and Lancets 250.01  Allergies (verified): No  Known Drug Allergies  Past History:  Past Medical History: Last updated: 02/17/2007 Allergic rhinitis Anemia-NOS Diabetes mellitus, type II GERD Hyperlipidemia Hypertension Benign prostatic hypertrophy  Review of Systems  The patient denies hypoglycemia.         denies numbness  Physical Exam  General:  normal appearance.   Msk:  spine is nontender Additional Exam:  AST                  [H]  90 U/L                      0-37 ALT                  [H]  61 U/L                      0-53 Hemoglobin A1C       [H]  11.2 %    Impression & Recommendations:  Problem # 1:  DIABETES MELLITUS, TYPE I, CONTROLLED, WITH RETINOPATHY (ICD-250.51) needs increased rx  Problem # 2:  HYPERTENSION (ICD-401.9) with situational component  Problem # 3:  BACK PAIN,  LUMBAR (Q000111Q) uncertain etiology  Problem # 4:  LIVER DISORDER (ICD-573.9) prob nash  Medications Added to Medication List This Visit: 1)  Novolin 70/30 70-30 % Susp (Insulin isophane & regular) .... 25 units qam and 10 units qpm  Other Orders: Admin of Therapeutic Inj  intramuscular or subcutaneous [id number]) Vit B12 1000 mcg [id number]) Orthopedic Surgeon Referral (Ortho Surgeon) TLB-Lipid Panel (80061-LIPID) TLB-BMP (Basic Metabolic Panel-BMET) (99991111) TLB-CBC Platelet - w/Differential (85025-CBCD) TLB-Hepatic/Liver Function Pnl (80076-HEPATIC) TLB-TSH (Thyroid Stimulating Hormone) (84443-TSH) TLB-A1C / Hgb A1C (Glycohemoglobin) (83036-A1C) TLB-Microalbumin/Creat Ratio, Urine (82043-MALB) TLB-PSA (Prostate Specific Antigen) (84153-PSA) TLB-Udip w/ Micro (81001-URINE) TLB-Uric Acid, Blood (84550-URIC) TLB-IBC Pnl (Iron/FE;Transferrin) (83550-IBC) Est. Patient Level IV [id number])  Patient Instructions: 1)  blood tests and x rays are being ordered for you today.  a few days after the test(s), please call [phone] to hear your test results. 2)  Please schedule a physical appointment in 1 month. 3)  pending the test  results, please continue the same medications for now 4)  check your blood sugar 2 times a day.  vary the time of day when you check, between before the 3 meals, and at bedtime.  also check if you have symptoms of your blood sugar being too high or too low.  please keep a record of the readings and bring it to your next appointment here.  please call us sooner if you are having low blood sugar episodes. 5)  refer orthopedics.  you will be called with a day and time for an appointment 6)  (update: i left message on phone-tree:  although it is very unlikely that the zocor is causing your elev lft, you should stop it for now.  increase insulin to 25 units am and 10 units pm).   Medication Administration  Injection # 1:    Medication: Vit B12 1000 mcg    Diagnosis: VITAMIN B12 DEFICIENCY (ICD-266.2)    Route: IM    Site: L deltoid    Exp Date: 07/2011    Lot #: [id number]    Mfr: [place]    Patient tolerated injection without complications    Given by: Rebeca Alert MA (December 09, 2009 8:47 AM)  Orders Added: 1)  Admin of Therapeutic Inj  intramuscular or subcutaneous [96372] 2)  Vit B12 1000 mcg [J3420] 3)  Orthopedic Surgeon Referral [Ortho Surgeon] 4)  TLB-Lipid Panel [80061-LIPID] 5)  TLB-BMP (Basic Metabolic Panel-BMET) 123456 6)  TLB-CBC Platelet - w/Differential [85025-CBCD] 7)  TLB-Hepatic/Liver Function Pnl [80076-HEPATIC] 8)  TLB-TSH (Thyroid Stimulating Hormone) [84443-TSH] 9)  TLB-A1C / Hgb A1C (Glycohemoglobin) [83036-A1C] 10)  TLB-Microalbumin/Creat Ratio, Urine [82043-MALB] 11)  TLB-PSA (Prostate Specific Antigen) [84153-PSA] 12)  TLB-Udip w/ Micro [81001-URINE] 13)  TLB-Uric Acid, Blood [84550-URIC] 14)  TLB-IBC Pnl (Iron/FE;Transferrin) [83550-IBC] 15)  Est. Patient Level IV [id number]

## 2010-05-19 NOTE — Assessment & Plan Note (Signed)
Summary: sore throat/aches   Vital Signs:  Patient profile:   77 year old male Height:      71 inches (180.34 cm) Weight:      154.38 pounds (70.17 kg) BMI:     21.61 O2 Sat:      99 % on Room air Temp:     98.1 degrees F (36.72 degrees C) oral Pulse rate:   74 / minute Pulse rhythm:   regular BP sitting:   132 / 72  (left arm) Cuff size:   regular  Vitals Entered By: Rebeca Alert CMA Deborra Medina) (March 04, 2010 8:02 AM)  O2 Flow:  Room air CC: Sore throat/body aches/aj Is Patient Diabetic? Yes   Primary Provider:  Donavan Foil MD  CC:  Sore throat/body aches/aj.  History of Present Illness: pt states few days of slight congestion in the sinuses, and assoc sore throat.   he has few weeks of generalized arthralgias.  no help with tramadol.   no cbg record, but states cbg's are improved.  it has gone low, occasionally, in the afternoon.  it is highest at other times of day.    Current Medications (verified): 1)  Aciphex 20 Mg Tbec (Rabeprazole Sodium) .... Take 1 Tablet By Mouth Once A Day 2)  Furosemide 80 Mg Tabs (Furosemide) .... Take 1 By Mouth Once Daily 3)  Lactulose 10 Gm/51ml Soln (Lactulose) .... Take 15 Ml By Mouth Twice A Day Prn 4)  Lisinopril-Hydrochlorothiazide 20-12.5 Mg Tabs (Lisinopril-Hydrochlorothiazide) .... Take 1 By Mouth Once Daily 5)  Norvasc 10 Mg  Tabs (Amlodipine Besylate) .... Take 1 By Mouth Once Daily 6)  Bd Insulin Syringe Ultrafine 31g X 5/16" 0.3 Ml  Misc (Insulin Syringe-Needle U-100) .... Use As Directed 7)  Aspirin Adult Low Strength 81 Mg Tbec (Aspirin) .... Take 1 By Mouth Once Daily 8)  Cozaar 100 Mg Tabs (Losartan Potassium) .... Once Daily 9)  Lotrimin Af 1 % Crea (Clotrimazole) .... Clean Under Foreskin and Apply To Eip of Penis Two Times A Day For 10 Days 10)  Carvedilol 3.125 Mg Tabs (Carvedilol) .Marland Kitchen.. 1 Two Times A Day 11)  Bayer Contour Test  Strp (Glucose Blood) .Marland Kitchen.. 1 Two Times A Day, and Lancets 250.01 12)  Tramadol Hcl 50 Mg  Tabs (Tramadol Hcl) .Marland Kitchen.. 1 Tab Every 4 Hrs As Needed For Pain 13)  Humulin N 100 Unit/ml Susp (Insulin Isophane Human) .... 20 Units Am and 15 Units Pm 14)  Citalopram Hydrobromide 20 Mg Tabs (Citalopram Hydrobromide) .Marland Kitchen.. 1 Tab Once Daily 15)  Azithromycin 500 Mg Tabs (Azithromycin) .Marland Kitchen.. 1 Tab Once Daily  Allergies (verified): No Known Drug Allergies  Past History:  Past Medical History: Last updated: 02/17/2007 Allergic rhinitis Anemia-NOS Diabetes mellitus, type II GERD Hyperlipidemia Hypertension Benign prostatic hypertrophy  Review of Systems  The patient denies fever.         he has a slight bilat earache.  Physical Exam  General:  normal appearance.   Head:  head: no deformity eyes: no periorbital swelling, no proptosis external nose and ears are normal mouth: no lesion seen Lungs:  Clear to auscultation bilaterally. Normal respiratory effort.  Msk:  shoulders:  able to abduct bilaterally, but painful.  no joint tenderness or swelling Additional Exam:  Hemoglobin A1C       [H]  10.5 %      Impression & Recommendations:  Problem # 1:  DIABETES MELLITUS, TYPE I, CONTROLLED, WITH RETINOPATHY (ICD-250.51) he needs some adjustment in his  therapy  Problem # 2:  uri new  Problem # 3:  OSTEOARTHRITIS (ICD-715.90) pain needs increased rx  Medications Added to Medication List This Visit: 1)  Humulin N 100 Unit/ml Susp (Insulin isophane human) .Marland Kitchen.. 18 units two times a day 2)  Doxycycline Hyclate 100 Mg Caps (Doxycycline hyclate) .Marland Kitchen.. 1 tab two times a day 3)  Hydrocodone-acetaminophen 10-325 Mg Tabs (Hydrocodone-acetaminophen) .Marland Kitchen.. 1 every 4 hrs as needed for pain  Other Orders: TLB-A1C / Hgb A1C (Glycohemoglobin) (83036-A1C) Est. Patient Level IV [id number])  Patient Instructions: 1)  change tramadol to vicodin 1 every 4 hrs as needed for pain. 2)  doxycycline 100 mg two times a day 3)  blood tests are being ordered for you today.  please call [phone] to hear  your test results. 4)  Please schedule a follow-up appointment in 3 months. 5)  (update: i left message on phone-tree:  change insulin to 18 units two times a day) Prescriptions: HYDROCODONE-ACETAMINOPHEN 10-325 MG TABS (HYDROCODONE-ACETAMINOPHEN) 1 every 4 hrs as needed for pain  #50 x 1   Entered and Authorized by:   Donavan Foil MD   Signed by:   Donavan Foil MD on 03/04/2010   Method used:   Print then Give to Patient   [id number] DOXYCYCLINE HYCLATE 100 MG CAPS (DOXYCYCLINE HYCLATE) 1 tab two times a day  #14 x 0   Entered and Authorized by:   Donavan Foil MD   Signed by:   Donavan Foil MD on 03/04/2010   Method used:   Electronically to        [facility] [phone]* (retail)       [place], [place]  [id number]       Ph: [phone] or [phone]       Fax: [phone]   RxID:   [id number]    Orders Added: 1)  TLB-A1C / Hgb A1C (Glycohemoglobin) [83036-A1C] 2)  Est. Patient Level IV [id number]

## 2010-05-19 NOTE — Assessment & Plan Note (Signed)
Summary: sore throat,legs, and shoulder-lb   Vital Signs:  Patient profile:   77 year old male Height:      71 inches (180.34 cm) Weight:      159 pounds (72.27 kg) BMI:     22.26 O2 Sat:      98 % on Room air Temp:     98.0 degrees F (36.67 degrees C) oral Pulse rate:   71 / minute Pulse rhythm:   132regular BP sitting:   132 / 78  (left arm) Cuff size:   regular  Vitals Entered By: Rebeca Alert CMA Deborra Medina) (February 16, 2010 8:29 AM)  O2 Flow:  Room air CC: Sore Throat x 2 weeks/aj Is Patient Diabetic? Yes   Primary Provider:  Donavan Foil MD  CC:  Sore Throat x 2 weeks/aj.  History of Present Illness: 4 days of moderate pain at the throat, and associated myalgias.   no cbg record, but states cbg's are "better."  he has had several episodes of mild hypoglycemia, when he misses a meal.   he also has itching of the right eac.  Current Medications (verified): 1)  Aciphex 20 Mg Tbec (Rabeprazole Sodium) .... Take 1 Tablet By Mouth Once A Day 2)  Furosemide 80 Mg Tabs (Furosemide) .... Take 1 By Mouth Qd 3)  Lactulose 10 Gm/72ml Soln (Lactulose) .... Take 15 Ml By Mouth Twice A Day Prn 4)  Lisinopril-Hydrochlorothiazide 20-12.5 Mg Tabs (Lisinopril-Hydrochlorothiazide) .... Take 1 By Mouth Once Daily 5)  Norvasc 10 Mg  Tabs (Amlodipine Besylate) .... Take 1 By Mouth Qd 6)  Bd Insulin Syringe Ultrafine 31g X 5/16" 0.3 Ml  Misc (Insulin Syringe-Needle U-100) .... Use As Directed 7)  Aspirin Adult Low Strength 81 Mg Tbec (Aspirin) .... Take 1 By Mouth Qd 8)  Cozaar 100 Mg Tabs (Losartan Potassium) .... Qd 9)  Lotrimin Af 1 % Crea (Clotrimazole) .... Clean Under Foreskin and Apply To Eip of Penis Two Times A Day For 10 Days 10)  Carvedilol 3.125 Mg Tabs (Carvedilol) .Marland Kitchen.. 1 Bid 11)  Bayer Contour Test  Strp (Glucose Blood) .Marland Kitchen.. 1 Two Times A Day, and Lancets 250.01 12)  Tramadol Hcl 50 Mg Tabs (Tramadol Hcl) .Marland Kitchen.. 1 Tab Every 4 Hrs As Needed For Pain 13)  Humulin N 100 Unit/ml  Susp (Insulin Isophane Human) .... 25 Units Am and 10 Units Pm 14)  Citalopram Hydrobromide 20 Mg Tabs (Citalopram Hydrobromide) .Marland Kitchen.. 1 Tab Once Daily  Allergies (verified): No Known Drug Allergies  Past History:  Past Medical History: Last updated: 02/17/2007 Allergic rhinitis Anemia-NOS Diabetes mellitus, type II GERD Hyperlipidemia Hypertension Benign prostatic hypertrophy  Review of Systems  The patient denies fever and syncope.    Physical Exam  General:  normal appearance.   Head:  head: no deformity eyes: no periorbital swelling, no proptosis external nose and ears are normal mouth: no lesion seen Ears:  TM's intact and clear with normal canals with grossly normal hearing.   Neck:  Supple without thyroid enlargement or tenderness.  Lungs:  Clear to auscultation bilaterally. Normal respiratory effort.    Impression & Recommendations:  Problem # 1:  DIABETES MELLITUS, TYPE I, CONTROLLED, WITH RETINOPATHY (ICD-250.51)  she needs some adjustment in her therapy  Orders: New Patient Level IV [id number])  Problem # 2:  uri  Problem # 3:  pruritis of eac  Medications Added to Medication List This Visit: 1)  Furosemide 80 Mg Tabs (Furosemide) .... Take 1 by mouth once daily 2)  Norvasc 10 Mg Tabs (Amlodipine besylate) .... Take 1 by mouth once daily 3)  Aspirin Adult Low Strength 81 Mg Tbec (Aspirin) .... Take 1 by mouth once daily 4)  Cozaar 100 Mg Tabs (Losartan potassium) .... Once daily 5)  Carvedilol 3.125 Mg Tabs (Carvedilol) .Marland Kitchen.. 1 two times a day 6)  Humulin N 100 Unit/ml Susp (Insulin isophane human) .... 20 units am and 15 units pm 7)  Azithromycin 500 Mg Tabs (Azithromycin) .Marland Kitchen.. 1 tab once daily  Patient Instructions: 1)  change nph insulin to 20 units am and 15 units in the evening. 2)  check your blood sugar 2 times a day.  vary the time of day when you check, between before the 3 meals, and at bedtime.  also check if you have symptoms of your blood  sugar being too high or too low.  please keep a record of the readings and bring it to your next appointment here.  please call us sooner if you are having low blood sugar episodes. 3)  Please schedule a follow-up appointment in 1 month. 4)  for itching of the ears, apply non-prescription hydrocortisone cream into the ear three times a day as needed. 5)  azithromycin 500 mg once daily. Prescriptions: AZITHROMYCIN 500 MG TABS (AZITHROMYCIN) 1 tab once daily  #6 x 0   Entered and Authorized by:   Donavan Foil MD   Signed by:   Donavan Foil MD on 02/16/2010   Method used:   Electronically to        [facility] [phone]* (retail)       [place], [place]  [id number]       Ph: [phone] or [phone]       Fax: [phone]   RxID:   [id number]    Orders Added: 1)  New Patient Level IV [id number]

## 2010-05-21 NOTE — Progress Notes (Signed)
Summary: Call Report  Phone Note Other Incoming   Caller: [facility] Summary of Call: [facility] Triage Call Report Triage Record Num: [id number] Operator: Benita Stabile Patient Name: Eric [name] Call Date & Time: 03/29/2010 4:34:54PM Patient Phone: [phone] PCP: Renato Shin Patient Gender: Male PCP Fax : [phone] Patient DOB: 11-29-1923 [facility] Name[facility] Reason for Call: [name] calling about blood sugar. Recent shoulder injury cortisone injection 03/26/10. Blood sugar got to 421 at 1530. 365 at 1630. Pt feeling well 03/29/10 no symptoms. Humalin N 18units taken at 1230 with lunch. Humalog 75/25,Novalin 70/30 available at home no sliding scale for pt. Per Dr Deborra Medina if asymptomatic watch,Check sugars> 400 and symptomatic ED, call MD in am. Mr Delon aware and will follow plan. Care advice given. Protocol(s) Used: Diabetes: Control Problems Recommended Outcome per Protocol: Call Lanah Steines Immediately Reason for Outcome: Blood sugar 250 mg/dl or higher even after taking extra insulin Care Advice:  ~ IMMEDIATE ACTION Check for Ketones Now: - If blood sugar more than 250 mg/dl check for ketones before calling Vincenza Dail. - Tell Rykin Route results or take log of recent results to Glorie Dowlen visit.  ~  ~ Call EMS 911 if any loss of consciousness, difficult to awaken, slow to respond, or new onset of confused thinking. Hyperglycemia: - Drink extra water to help prevent hyperglycemia and dehydration. - Do not exercise when blood sugar is high or urine ketones are present.  ~ 12/ Initial call taken by: Crissie Sickles, [place],  March 30, 2010 8:47 AM  Follow-up for Phone Call        please call patient: increase nph insulin to 24 units two times a day, until cbg returns to 100's.  then return to previous dosage.  call 2 days if cbg stays over 200. Follow-up by: Donavan Foil MD,  March 30, 2010 10:18 AM  Additional Follow-up for Phone Call Additional follow up  Details #1::        Per pt CBGs this morning was 144, he wants to know is he should continue with normal dosage and increase if CBG become elevated again? Please advise? Additional Follow-up by: Crissie Sickles, CMA,  March 30, 2010 10:39 AM    Additional Follow-up for Phone Call Additional follow up Details #2::    yes, please return to the dosage as of last ov Follow-up by: Donavan Foil MD,  March 30, 2010 12:37 PM  Additional Follow-up for Phone Call Additional follow up Details #3:: Details for Additional Follow-up Action Taken: Pt advised Additional Follow-up by: Crissie Sickles, CMA,  March 30, 2010 3:00 PM

## 2010-05-21 NOTE — Medication Information (Signed)
Summary: [facility] Interaction[facility]  [facility]   Imported By: Phillis Knack 05/14/2010 09:40:21  _____________________________________________________________________  External Attachment:    Type:   Image     Comment:   External Document

## 2010-05-21 NOTE — Assessment & Plan Note (Signed)
Summary: LEG PAIN/ BS IS ELEV /NWS   Vital Signs:  Patient profile:   77 year old male Height:      71 inches (180.34 cm) Weight:      154 pounds (70 kg) BMI:     21.56 O2 Sat:      97 % on Room air Temp:     99.3 degrees F (37.39 degrees C) oral Pulse rate:   91 / minute Pulse rhythm:   regular BP sitting:   158 / 96  (left arm) Cuff size:   regular  Vitals Entered By: Rebeca Alert CMA Deborra Medina) (April 22, 2010 1:47 PM)  O2 Flow:  Room air CC: Elevated BP, bilateral swelling and pain in both legs, shoulder pain/aj Is Patient Diabetic? Yes   Primary Provider:  Donavan Foil MD  CC:  Elevated BP, bilateral swelling and pain in both legs, and shoulder pain/aj.  History of Present Illness: pt states few mos of moderate pain at both legs, and assoc swelling.  it is sometimes related to exertion.   bilat shoulder pain is only slightly improved since steroid injections. no cbg record, but states cbg's are sometimes in the 70's in the afternoon.    Current Medications (verified): 1)  Aciphex 20 Mg Tbec (Rabeprazole Sodium) .... Take 1 Tablet By Mouth Once A Day 2)  Furosemide 80 Mg Tabs (Furosemide) .... Take 1 By Mouth Once Daily 3)  Lactulose 10 Gm/51ml Soln (Lactulose) .... Take 15 Ml By Mouth Twice A Day Prn 4)  Lisinopril-Hydrochlorothiazide 20-12.5 Mg Tabs (Lisinopril-Hydrochlorothiazide) .... Take 1 By Mouth Once Daily 5)  Norvasc 10 Mg  Tabs (Amlodipine Besylate) .... Take 1 By Mouth Once Daily 6)  Bd Insulin Syringe Ultrafine 31g X 5/16" 0.3 Ml  Misc (Insulin Syringe-Needle U-100) .... Use As Directed 7)  Aspirin Adult Low Strength 81 Mg Tbec (Aspirin) .... Take 1 By Mouth Once Daily 8)  Cozaar 100 Mg Tabs (Losartan Potassium) .... Once Daily 9)  Lotrimin Af 1 % Crea (Clotrimazole) .... Clean Under Foreskin and Apply To Eip of Penis Two Times A Day For 10 Days 10)  Carvedilol 3.125 Mg Tabs (Carvedilol) .Marland Kitchen.. 1 Two Times A Day 11)  Bayer Contour Test  Strp (Glucose Blood)  .Marland Kitchen.. 1 Two Times A Day, and Lancets 250.01 12)  Humulin N 100 Unit/ml Susp (Insulin Isophane Human) .Marland Kitchen.. 18 Units Two Times A Day 13)  Citalopram Hydrobromide 20 Mg Tabs (Citalopram Hydrobromide) .Marland Kitchen.. 1 Tab Once Daily 14)  Doxycycline Hyclate 100 Mg Caps (Doxycycline Hyclate) .Marland Kitchen.. 1 Tab Two Times A Day 15)  Hydrocodone-Acetaminophen 10-325 Mg Tabs (Hydrocodone-Acetaminophen) .Marland Kitchen.. 1 Every 4 Hrs As Needed For Pain  Allergies (verified): No Known Drug Allergies  Past History:  Past Medical History: Last updated: 02/17/2007 Allergic rhinitis Anemia-NOS Diabetes mellitus, type II GERD Hyperlipidemia Hypertension Benign prostatic hypertrophy  Review of Systems       he has little if any numbness of the legs.    Physical Exam  Pulses:  dorsalis pedis intact bilat.   Extremities:  no deformity.  no ulcer on the feet.  feet are of normal color, but legs have multiple healed ulcers (hyper- and hypopigmented areas).  the feet are slightly cool.   there is 1+ bilat ankle edema, but no erythema/warmth/tenderness.  no calf swelling or tenderness.  mycotic toenails.   Neurologic:  sensation is intact to touch on the feet.    Impression & Recommendations:  Problem # 1:  LEG PAIN,  BILATERAL (ICD-729.5) Assessment Unchanged recurrent.  uncertain etiology  Problem # 2:  DIABETES MELLITUS, TYPE I, CONTROLLED, WITH RETINOPATHY (ICD-250.51) apparently overcontrolled.  Problem # 3:  shoulder pain slightly improved  Medications Added to Medication List This Visit: 1)  Humulin N 100 Unit/ml Susp (Insulin isophane human) .Marland Kitchen.. 16 units two times a day  Other Orders: [facility] Clinic (Vascular) Est. Patient Level IV [id number]  Patient Instructions: 1)  Please schedule a follow-up appointment in 2 months. 2)  reduce insulin to 16 units two times a day. 3)  check circulation test of the legs.  you will be called with a day and time for an appointment.   Orders Added: 1)  Vascular Clinic  [Vascular] 2)  Est. Patient Level IV [id number]

## 2010-05-21 NOTE — Miscellaneous (Signed)
Summary: Orders Update  Clinical Lists Changes  Orders: Added new Test order of Arterial Duplex Lower Extremity (Arterial Duplex Low) - Signed 

## 2010-05-21 NOTE — Letter (Signed)
Summary: [place]   Imported By: Phillis Knack 04/01/2010 10:47:36  _____________________________________________________________________  External Attachment:    Type:   Image     Comment:   External Document

## 2010-05-22 ENCOUNTER — Encounter: Payer: Self-pay | Admitting: Endocrinology

## 2010-05-27 NOTE — Assessment & Plan Note (Signed)
Summary: sweeling in legs-lb   Vital Signs:  Patient profile:   77 year old male Height:      71 inches (180.34 cm) Weight:      153 pounds (69.55 kg) BMI:     21.42 O2 Sat:      98 % on Room air Temp:     98.3 degrees F (36.83 degrees C) oral Pulse rate:   73 / minute Pulse rhythm:   regular BP sitting:   122 / 86  (left arm) Cuff size:   regular  Vitals Entered By: Rebeca Alert CMA Deborra Medina) (May 15, 2010 8:15 AM)  O2 Flow:  Room air CC: Bilateral swelling in both legs/aj Is Patient Diabetic? Yes   Primary Provider:  Donavan Foil MD  CC:  Bilateral swelling in both legs/aj.  History of Present Illness: the status of at least 3 ongoing medical problems is addressed today: edema: pt says it persists. htn: he takes bp meds as rx'ed.  no sob dm: no cbg record, but states cbg's are well-controlled.  he has mild hypoglycemic sxs in the afternoon.  Current Medications (verified): 1)  Aciphex 20 Mg Tbec (Rabeprazole Sodium) .... Take 1 Tablet By Mouth Once A Day 2)  Furosemide 80 Mg Tabs (Furosemide) .... Take 1 By Mouth Once Daily 3)  Lactulose 10 Gm/9ml Soln (Lactulose) .... Take 15 Ml By Mouth Twice A Day Prn 4)  Lisinopril-Hydrochlorothiazide 20-12.5 Mg Tabs (Lisinopril-Hydrochlorothiazide) .... Take 1 By Mouth Once Daily 5)  Norvasc 10 Mg  Tabs (Amlodipine Besylate) .... Take 1 By Mouth Once Daily 6)  Bd Insulin Syringe Ultrafine 31g X 5/16" 0.3 Ml  Misc (Insulin Syringe-Needle U-100) .... Use As Directed 7)  Aspirin Adult Low Strength 81 Mg Tbec (Aspirin) .... Take 1 By Mouth Once Daily 8)  Cozaar 100 Mg Tabs (Losartan Potassium) .... Once Daily 9)  Lotrimin Af 1 % Crea (Clotrimazole) .... Clean Under Foreskin and Apply To Eip of Penis Two Times A Day For 10 Days 10)  Carvedilol 3.125 Mg Tabs (Carvedilol) .Marland Kitchen.. 1 Two Times A Day 11)  Bayer Contour Test  Strp (Glucose Blood) .Marland Kitchen.. 1 Two Times A Day, and Lancets 250.01 12)  Humulin N 100 Unit/ml Susp (Insulin Isophane  Human) .Marland Kitchen.. 16 Units Two Times A Day 13)  Citalopram Hydrobromide 20 Mg Tabs (Citalopram Hydrobromide) .Marland Kitchen.. 1 Tab Once Daily 14)  Doxycycline Hyclate 100 Mg Caps (Doxycycline Hyclate) .Marland Kitchen.. 1 Tab Two Times A Day 15)  Hydrocodone-Acetaminophen 10-325 Mg Tabs (Hydrocodone-Acetaminophen) .Marland Kitchen.. 1 Every 4 Hrs As Needed For Pain  Allergies (verified): No Known Drug Allergies  Past History:  Past Medical History: Last updated: 02/17/2007 Allergic rhinitis Anemia-NOS Diabetes mellitus, type II GERD Hyperlipidemia Hypertension Benign prostatic hypertrophy  Review of Systems  The patient denies weight loss and weight gain.    Physical Exam  General:  normal appearance.   Extremities:  2+ right pedal edema and trace left pedal edema.     Impression & Recommendations:  Problem # 1:  DIABETES MELLITUS, TYPE I, CONTROLLED, WITH RETINOPATHY (ICD-250.51) poor control  Problem # 2:  edema prob due to norvasc  Problem # 3:  HYPERTENSION (ICD-401.9) well-controlled he can tolerate a decrease of his norvasc  Medications Added to Medication List This Visit: 1)  Amlodipine Besylate 5 Mg Tabs (Amlodipine besylate) .Marland Kitchen.. 1 tab once daily  Other Orders: TLB-A1C / Hgb A1C (Glycohemoglobin) (83036-A1C) Est. Patient Level IV [id number])  Patient Instructions: 1)  we will need to take  this complex situation in stages 2)  blood tests are being ordered for you today.  please call [phone] to hear your test results. 3)  pending the test results, please reduce amlodipine to 5 mg once daily. 4)  Please schedule a follow-up appointment in 1 month. 5)  (update: i left message on phone-tree: i need more cbg info in order to safely adjust insulin) Prescriptions: AMLODIPINE BESYLATE 5 MG TABS (AMLODIPINE BESYLATE) 1 tab once daily  #30 x 11   Entered and Authorized by:   Donavan Foil MD   Signed by:   Donavan Foil MD on 05/15/2010   Method used:   Electronically to        [facility]  [phone]* (retail)       [place], [place]  [id number]       Ph: [phone] or [phone]       Fax: [phone]   RxID:   [id number]    Orders Added: 1)  TLB-A1C / Hgb A1C (Glycohemoglobin) [83036-A1C] 2)  Est. Patient Level IV [id number]

## 2010-06-10 NOTE — Letter (Signed)
Summary: [facility]  [facility]   Imported By: Bubba Hales 06/01/2010 10:16:50  _____________________________________________________________________  External Attachment:    Type:   Image     Comment:   External Document

## 2010-06-16 ENCOUNTER — Ambulatory Visit (INDEPENDENT_AMBULATORY_CARE_PROVIDER_SITE_OTHER): Payer: Medicare Other | Admitting: Endocrinology

## 2010-06-16 ENCOUNTER — Encounter: Payer: Self-pay | Admitting: Endocrinology

## 2010-06-16 DIAGNOSIS — E1039 Type 1 diabetes mellitus with other diabetic ophthalmic complication: Secondary | ICD-10-CM

## 2010-06-25 ENCOUNTER — Ambulatory Visit: Payer: Self-pay | Admitting: Endocrinology

## 2010-06-25 NOTE — Assessment & Plan Note (Signed)
Summary: 1 MTH FU  STC   Vital Signs:  Patient profile:   77 year old male Height:      70 inches Weight:      155 pounds BMI:     22.32 O2 Sat:      99 % on Room air Temp:     98.3 degrees F oral Pulse rate:   64 / minute BP sitting:   142 / 84  (left arm) Cuff size:   regular  Vitals Entered By: Crissie Sickles, CMA (June 16, 2010 8:00 AM)  O2 Flow:  Room air CC: 1 mth DM follow up/DBD   Primary Provider:  Donavan Foil MD  CC:  1 mth DM follow up/DBD.  History of Present Illness: the status of at least 3 ongoing medical problems is addressed today: edema:  pt says this is unchanged with the reduction of norvasc. htn:  he takes meds as rx'ed.  no sob dm:  no cbg record, but states cbg's vary from 80 (afternoon, after he missed lunch), to 500 (not sure when).  no hypoglycemic sxs.    Allergies: No Known Drug Allergies  Past History:  Past Medical History: Last updated: 02/17/2007 Allergic rhinitis Anemia-NOS Diabetes mellitus, type II GERD Hyperlipidemia Hypertension Benign prostatic hypertrophy  Review of Systems  The patient denies syncope.         pt says he has lost a few lbs.    Physical Exam  General:  normal appearance.   Extremities:  2+ right pedal edema and 1+ left pedal edema.     Impression & Recommendations:  Problem # 1:  edema unchanged  Problem # 2:  DIABETES MELLITUS, TYPE I, CONTROLLED, WITH RETINOPATHY (ICD-250.51) therapy limited by pt's request for least expensive meds, and by noncompliance with cbg's.    Problem # 3:  HYPERTENSION (ICD-401.9) needs increased rx  Medications Added to Medication List This Visit: 1)  Lisinopril-hydrochlorothiazide 20-12.5 Mg Tabs (Lisinopril-hydrochlorothiazide) .... Take 2 tabs by mouth once daily  Other Orders: Est. Patient Level IV [id number])  Patient Instructions: 1)  Please schedule a follow-up appointment in 2 weeks.   2)  stop amlodipine. 3)  increase lisinopril/hctz to  2 tabs once daily. 4)  on this type of insulin schedule, it is unsafe to miss or delay meals. 5)  check your blood sugar 2 times a day.  vary the time of day when you check, between before the 3 meals, and at bedtime.  also check if you have symptoms of your blood sugar being too high or too low.  please keep a record of the readings and bring it to your next appointment here.  please call us sooner if you are having low blood sugar episodes. Prescriptions: LISINOPRIL-HYDROCHLOROTHIAZIDE 20-12.5 MG TABS (LISINOPRIL-HYDROCHLOROTHIAZIDE) take 2 tabs by mouth once daily  #60 x 11   Entered and Authorized by:   Donavan Foil MD   Signed by:   Donavan Foil MD on 06/16/2010   Method used:   Electronically to        [facility] [phone]* (retail)       [place], [place]  [id number]       Ph: [phone] or [phone]       Fax: [phone]   RxID:   [id number]    Orders Added: 1)  Est. Patient Level IV [id number]

## 2010-06-30 ENCOUNTER — Encounter: Payer: Self-pay | Admitting: Endocrinology

## 2010-06-30 ENCOUNTER — Ambulatory Visit (INDEPENDENT_AMBULATORY_CARE_PROVIDER_SITE_OTHER): Payer: Medicare Other | Admitting: Endocrinology

## 2010-06-30 DIAGNOSIS — E1039 Type 1 diabetes mellitus with other diabetic ophthalmic complication: Secondary | ICD-10-CM

## 2010-07-07 NOTE — Assessment & Plan Note (Signed)
Summary: 2 WK FU  STC   Vital Signs:  Patient profile:   77 year old male Height:      70 inches (177.80 cm) Weight:      150.25 pounds (68.30 kg) BMI:     21.64 O2 Sat:      99 % on Room air Temp:     98.0 degrees F (36.67 degrees C) oral Pulse rate:   66 / minute Pulse rhythm:   regular BP sitting:   132 / 80  (left arm) Cuff size:   regular  Vitals Entered By: Rebeca Alert CMA Deborra Medina) (June 30, 2010 8:32 AM)  O2 Flow:  Room air CC: 2 week F/U/aj Is Patient Diabetic? Yes   Primary Provider:  Donavan Foil MD  CC:  2 week F/U/aj.  History of Present Illness: the status of at least 3 ongoing medical problems is addressed today: edema:  is much better htn:   he takes zestoretic as rx'ed, and is off norvasc.  no sob. dm:  he brings a record of his cbg's which i have reviewed today.  it varies from 150-300, with no trend throughout the day.  no hypoglycemia sxs.  Current Medications (verified): 1)  Aciphex 20 Mg Tbec (Rabeprazole Sodium) .... Take 1 Tablet By Mouth Once A Day 2)  Furosemide 80 Mg Tabs (Furosemide) .... Take 1 By Mouth Once Daily 3)  Lactulose 10 Gm/69ml Soln (Lactulose) .... Take 15 Ml By Mouth Twice A Day Prn 4)  Lisinopril-Hydrochlorothiazide 20-12.5 Mg Tabs (Lisinopril-Hydrochlorothiazide) .... Take 2 Tabs By Mouth Once Daily 5)  Bd Insulin Syringe Ultrafine 31g X 5/16" 0.3 Ml  Misc (Insulin Syringe-Needle U-100) .... Use As Directed 6)  Aspirin Adult Low Strength 81 Mg Tbec (Aspirin) .... Take 1 By Mouth Once Daily 7)  Cozaar 100 Mg Tabs (Losartan Potassium) .... Once Daily 8)  Lotrimin Af 1 % Crea (Clotrimazole) .... Clean Under Foreskin and Apply To Eip of Penis Two Times A Day For 10 Days 9)  Carvedilol 3.125 Mg Tabs (Carvedilol) .Marland Kitchen.. 1 Two Times A Day 10)  Bayer Contour Test  Strp (Glucose Blood) .Marland Kitchen.. 1 Two Times A Day, and Lancets 250.01 11)  Humulin N 100 Unit/ml Susp (Insulin Isophane Human) .Marland Kitchen.. 16 Units Two Times A Day 12)  Citalopram  Hydrobromide 20 Mg Tabs (Citalopram Hydrobromide) .Marland Kitchen.. 1 Tab Once Daily 13)  Doxycycline Hyclate 100 Mg Caps (Doxycycline Hyclate) .Marland Kitchen.. 1 Tab Two Times A Day 14)  Hydrocodone-Acetaminophen 10-325 Mg Tabs (Hydrocodone-Acetaminophen) .Marland Kitchen.. 1 Every 4 Hrs As Needed For Pain  Allergies (verified): No Known Drug Allergies  Past History:  Past Medical History: Last updated: 02/17/2007 Allergic rhinitis Anemia-NOS Diabetes mellitus, type II GERD Hyperlipidemia Hypertension Benign prostatic hypertrophy  Review of Systems  The patient denies weight loss and weight gain.    Physical Exam  General:  normal appearance.   Extremities:  trace right pedal edema and trace left pedal edema.     Impression & Recommendations:  Problem # 1:  edema improved  Problem # 2:  HYPERTENSION (ICD-401.9) well-controlled  Problem # 3:  DIABETES MELLITUS, TYPE I, CONTROLLED, WITH RETINOPATHY (ICD-250.51) needs increased rx  Medications Added to Medication List This Visit: 1)  Humulin N 100 Unit/ml Susp (Insulin isophane human) .Marland KitchenMarland KitchenMarland Kitchen 17 units two times a day  Other Orders: Est. Patient Level IV [id number])  Patient Instructions: 1)  Please schedule a follow-up appointment in 3 weeks.   2)  on this type of insulin schedule, it  is unsafe to miss or delay meals. 3)  check your blood sugar 2 times a day.  vary the time of day when you check, between before the 3 meals, and at bedtime.  also check if you have symptoms of your blood sugar being too high or too low.  please keep a record of the readings and bring it to your next appointment here.  please call us sooner if you are having low blood sugar episodes.   4)  increase insulin to 17 units two times a day.   Orders Added: 1)  Est. Patient Level IV [id number]

## 2010-07-21 ENCOUNTER — Ambulatory Visit (INDEPENDENT_AMBULATORY_CARE_PROVIDER_SITE_OTHER): Payer: Medicare Other | Admitting: Endocrinology

## 2010-07-21 ENCOUNTER — Encounter: Payer: Self-pay | Admitting: Endocrinology

## 2010-07-21 DIAGNOSIS — E1039 Type 1 diabetes mellitus with other diabetic ophthalmic complication: Secondary | ICD-10-CM

## 2010-07-21 DIAGNOSIS — L918 Other hypertrophic disorders of the skin: Secondary | ICD-10-CM

## 2010-07-21 DIAGNOSIS — L909 Atrophic disorder of skin, unspecified: Secondary | ICD-10-CM

## 2010-07-21 NOTE — Progress Notes (Signed)
  Subjective:    Patient ID: Eric [name], male    DOB: 1924/02/26, 77 y.o.   MRN: [id number]  HPI Since last ov, he has had only 1 episode of hypoglycemia, and this was mild.  It was before lunch.  no cbg record, but states cbg's are still often over 200. Pt states few weeks of slight pain at the left foot, but no assoc numbness.   Review of Systems denies loc.  Edema is improved.    Objective:   Physical Exam GENERAL: no distress. Pulses: dorsalis pedis intact bilat.   Feet: no deformity.  no ulcer on the feet.  feet are of normal color and temp.  There is 1+ bilat leg edema, and bilat onychomycosis. Neuro: sensation is intact to touch on the feet.   Procedure: 1 skin tag is removed from the right upper back.  Local anesthesia is 1% xylocaine, with epinephrine.  The tag is removed with a cauterizer.  No bleeding or other complications.         Assessment & Plan:  Dm, needs increased rx Foot pain, uncertain etiology.  New problem.

## 2010-07-21 NOTE — Patient Instructions (Addendum)
Increase insulin to 18 units 2x a day. Please make a follow-up appointment in 3 months. Please continue follow-up with your podiatrist.  Wide shoes may help your symptoms.

## 2010-07-23 LAB — BASIC METABOLIC PANEL
BUN: 31 mg/dL — ABNORMAL HIGH (ref 6–23)
CO2: 23 mEq/L (ref 19–32)
Chloride: 98 mEq/L (ref 96–112)
Creatinine, Ser: 1.3 mg/dL (ref 0.4–1.5)
GFR calc Af Amer: >60 mL/min (ref >60)
Glucose, Bld: 349 mg/dL — ABNORMAL HIGH (ref 70–99)

## 2010-07-24 LAB — GLUCOSE, CAPILLARY: Glucose-Capillary: 224 mg/dL — ABNORMAL HIGH (ref 70–99)

## 2010-09-04 NOTE — Assessment & Plan Note (Signed)
Eric [name]                           GASTROENTEROLOGY OFFICE NOTE   Eric [name], Eric [name] Eric [name]                       MRN:          [id number]  DATE:11/22/2005                            DOB:          July 09, 1923    GI CONSULTATION:  Eric [name] is a very nice 77 year old gentleman whom we  saw in the past for dyspepsia.  He had an upper endoscopy in March of 2004  which showed a hiatal hernia with some retained food.  He is here today  because of colorectal screening.  I have done a flexible sigmoidoscopy on  him in 1987.  It was normal  He has a positive family history of cancer.  Initially, he thought his father had a colon cancer, but he found out it was  a prostate cancer.  He denies rectal bleeding.  His bowel habits are  somewhat constipating, having bowel movements every 3-4 days without any  bright red blood or abdominal pain.  His weight has been stable.   MEDICATIONS:  1.  Aspirin 81 mg daily.  2.  AcipHex 20 mg p.o. daily.  3.  Toprol-XL 25 mg daily.  4.  Lasix 80 mg p.o. daily.  5.  Procardia XL 30 mg daily.  6.  Insulin.  7.  Lisinopril.  8.  Benicar.  9.  Vytorin.   PHYSICAL EXAMINATION:  VITAL SIGNS:  Blood pressure 112/70, pulse 68.  Weight 168 pounds.  GENERAL:  He was alert and oriented, in no distress.  _________.  NECK:  Supple.  No adenopathy.  LUNGS:  Clear to auscultation.  HEART:  Normal S1 and S2.  Admits to frequent irregular beats.  ABDOMEN:  Soft.  There are normoactive bowel sounds.  Increased tympany in  the upper abdomen.  No mass, no tenderness.  RECTAL EXAM:  Some irregular.  Firm prostate.  Stool was heme negative.  EXTREMITIES:  Stasis dermatitis.  No edema.   IMPRESSION:  12.  A 77 year old African-American male due for colonoscopy.  He has mild      constipation, but Hemoccult negative stool.  He had a previous normal      flexible sigmoidoscopy 20 years ago.  Labs revealed hemoglobin of 14.3,      hematocrit  44.6.  2.  Diabetes mellitus, insulin dependent.  3.  Hypertension.  4.  Dyspepsia, currently controlled on PPIs.   PLAN:  Colonoscopy is being scheduled.  I have discussed with the patient  the prep, conscious sedation, as well as the procedure itself.  He has gone  ahead and scheduled it.                                   Lowella Bandy. Olevia Perches, MD   DMB/MedQ  DD:  11/22/2005  DT:  11/22/2005  Job #:  [id number]   cc:   Hilliard Clark A. [name], MD

## 2010-09-11 ENCOUNTER — Ambulatory Visit: Payer: Medicare Other | Admitting: Physical Therapy

## 2010-09-24 ENCOUNTER — Ambulatory Visit: Payer: Medicare Other | Attending: Neurology | Admitting: Physical Therapy

## 2010-09-24 DIAGNOSIS — IMO0001 Reserved for inherently not codable concepts without codable children: Secondary | ICD-10-CM | POA: Insufficient documentation

## 2010-09-24 DIAGNOSIS — M6281 Muscle weakness (generalized): Secondary | ICD-10-CM | POA: Insufficient documentation

## 2010-09-24 DIAGNOSIS — R293 Abnormal posture: Secondary | ICD-10-CM | POA: Insufficient documentation

## 2010-09-24 DIAGNOSIS — M2550 Pain in unspecified joint: Secondary | ICD-10-CM | POA: Insufficient documentation

## 2010-09-24 DIAGNOSIS — M2560 Stiffness of unspecified joint, not elsewhere classified: Secondary | ICD-10-CM | POA: Insufficient documentation

## 2010-09-28 ENCOUNTER — Encounter: Payer: Medicare Other | Admitting: Physical Therapy

## 2010-10-05 ENCOUNTER — Encounter: Payer: Medicare Other | Admitting: Physical Therapy

## 2010-10-06 ENCOUNTER — Ambulatory Visit: Payer: Medicare Other | Admitting: Physical Therapy

## 2010-10-08 ENCOUNTER — Ambulatory Visit: Payer: Medicare Other | Admitting: Physical Therapy

## 2010-10-19 ENCOUNTER — Ambulatory Visit (INDEPENDENT_AMBULATORY_CARE_PROVIDER_SITE_OTHER): Payer: Medicare Other | Admitting: Endocrinology

## 2010-10-19 ENCOUNTER — Encounter: Payer: Self-pay | Admitting: Endocrinology

## 2010-10-19 ENCOUNTER — Other Ambulatory Visit (INDEPENDENT_AMBULATORY_CARE_PROVIDER_SITE_OTHER): Payer: Medicare Other

## 2010-10-19 VITALS — BP 110/82 | HR 63 | Temp 98.2°F | Ht 70.0 in | Wt 153.4 lb

## 2010-10-19 DIAGNOSIS — E1039 Type 1 diabetes mellitus with other diabetic ophthalmic complication: Secondary | ICD-10-CM

## 2010-10-19 NOTE — Progress Notes (Signed)
Subjective:    Patient ID: Eric [name], male    DOB: 04-18-1924, 77 y.o.   MRN: [id number]  HPI Pt states 1 month of intermittent moderate pain at both hands, and assoc numbness. no cbg record, but states cbg's are "220's."  On further questioning, he says "it was 95 yesterday." Past Medical History  Diagnosis Date  . DIABETES MELLITUS, TYPE I, CONTROLLED, WITH RETINOPATHY 10/08/2008  . VITAMIN B12 DEFICIENCY 10/24/2008  . ANEMIA-NOS 02/17/2007  . DEPRESSION 12/25/2008  . HYPERTENSION 02/17/2007  . GERD 02/17/2007  . LIVER DISORDER 12/09/2009  . Gastroparesis 02/17/2007  . OSTEOARTHRITIS 03/04/2010  . BACK PAIN, LUMBAR 09/09/2009  . PSA, INCREASED 10/08/2008  . BENIGN PROSTATIC HYPERTROPHY 02/17/2007    Past Surgical History  Procedure Date  . Transurethral resection of prostate     History   Social History  . Marital Status: Widowed    Spouse Name: N/A    Number of Children: N/A  . Years of Education: N/A   Occupational History  . Not on file.   Social History Main Topics  . Smoking status: Never Smoker   . Smokeless tobacco: Not on file  . Alcohol Use: No  . Drug Use: No  . Sexually Active:    Other Topics Concern  . 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 aspirin 81 MG tablet Take 81 mg by mouth daily.        . carvedilol (COREG) 3.125 MG tablet Take 3.125 mg by mouth 2 (two) times daily with a meal.        . citalopram (CELEXA) 20 MG tablet Take 20 mg by mouth daily.        . clotrimazole (LOTRIMIN) 1 % cream Apply topically 2 (two) times daily.        . furosemide (LASIX) 80 MG tablet Take 80 mg by mouth daily.        Marland Kitchen glucose blood (BAYER CONTOUR TEST) test strip Two times a day dx 250.01       . HYDROcodone-acetaminophen (NORCO) 10-325 MG per tablet Take 1 tablet by mouth every 4 (four) hours as needed.  For pain       . insulin NPH (HUMULIN N) 100 UNIT/ML injection Inject 18 Units into the skin 2 (two) times daily before a meal.       . Insulin Syringe-Needle U-100 31G X 5/16" 0.3 ML MISC Use as directed       . lactulose (CHRONULAC) 10 GM/15ML solution Take 15 ml by mouth twice a day as needed       . lisinopril-hydrochlorothiazide (PRINZIDE,ZESTORETIC) 20-12.5 MG per tablet Take 2 tablets by mouth daily.        Marland Kitchen losartan (COZAAR) 100 MG tablet Take 100 mg by mouth daily.        . RABEprazole (ACIPHEX) 20 MG tablet Take 20 mg by mouth daily.          No Known Allergies  Family History  Problem Relation Age of Onset  . Cancer Father     had uncertain type of cancer  . Cancer Brother     Prostate Cancer  . Cancer Brother     Prostate Cancer    BP 110/82  Pulse 63  Temp(Src) 98.2 F (36.8 C) (Oral)  Ht 5\' 10"  (1.778 m)  Wt 153 lb 6.4  oz (69.582 kg)  BMI 22.01 kg/m2  SpO2 97%  Review of Systems No change in chronic pain and numbness of the feet.  Denies loc    Objective:   Physical Exam Neuro: sensation is intact to touch on the hands, but decreased from normal. Strength is slightly decreased on the hands (limited by pain).  Hands are otherwise normal to my exam.    Assessment & Plan:  Dm, therapy limited by noncompliance with cbg's.  i'll do the best i can. Hand sxs, prob due to cts or dm neuropathy.

## 2010-10-19 NOTE — Patient Instructions (Addendum)
Call if you want to do a test of the muscles and nerve endings of the hands and arms. blood tests are being ordered for you today.  please call [phone] to hear your test results.  You will be prompted to enter the 9-digit "MRN" number that appears at the top left of this page, followed by #.  Then you will hear the message. pending the test results, please continue insulin at 18 units 2x a day. Please make a regular physical appointment in 3 months.

## 2010-10-26 ENCOUNTER — Ambulatory Visit: Payer: Medicare Other | Attending: Neurology | Admitting: Physical Therapy

## 2010-10-26 DIAGNOSIS — IMO0001 Reserved for inherently not codable concepts without codable children: Secondary | ICD-10-CM | POA: Insufficient documentation

## 2010-10-26 DIAGNOSIS — M2550 Pain in unspecified joint: Secondary | ICD-10-CM | POA: Insufficient documentation

## 2010-10-26 DIAGNOSIS — R293 Abnormal posture: Secondary | ICD-10-CM | POA: Insufficient documentation

## 2010-10-26 DIAGNOSIS — M6281 Muscle weakness (generalized): Secondary | ICD-10-CM | POA: Insufficient documentation

## 2010-10-26 DIAGNOSIS — M2560 Stiffness of unspecified joint, not elsewhere classified: Secondary | ICD-10-CM | POA: Insufficient documentation

## 2010-10-28 ENCOUNTER — Ambulatory Visit: Payer: Medicare Other | Admitting: Rehabilitative and Restorative Service Providers"

## 2010-11-02 ENCOUNTER — Ambulatory Visit: Payer: Medicare Other | Admitting: Physical Therapy

## 2010-11-04 ENCOUNTER — Ambulatory Visit: Payer: Medicare Other | Admitting: Physical Therapy

## 2010-11-09 ENCOUNTER — Ambulatory Visit: Payer: Medicare Other | Admitting: Physical Therapy

## 2010-11-11 ENCOUNTER — Ambulatory Visit: Payer: Medicare Other | Admitting: Physical Therapy

## 2010-11-16 ENCOUNTER — Ambulatory Visit: Payer: Medicare Other | Admitting: Physical Therapy

## 2010-11-18 ENCOUNTER — Ambulatory Visit: Payer: Medicare Other | Attending: Neurology | Admitting: Physical Therapy

## 2010-11-18 DIAGNOSIS — IMO0001 Reserved for inherently not codable concepts without codable children: Secondary | ICD-10-CM | POA: Insufficient documentation

## 2010-11-18 DIAGNOSIS — M6281 Muscle weakness (generalized): Secondary | ICD-10-CM | POA: Insufficient documentation

## 2010-11-18 DIAGNOSIS — M2560 Stiffness of unspecified joint, not elsewhere classified: Secondary | ICD-10-CM | POA: Insufficient documentation

## 2010-11-18 DIAGNOSIS — M2550 Pain in unspecified joint: Secondary | ICD-10-CM | POA: Insufficient documentation

## 2010-11-18 DIAGNOSIS — R293 Abnormal posture: Secondary | ICD-10-CM | POA: Insufficient documentation

## 2010-11-21 ENCOUNTER — Ambulatory Visit (INDEPENDENT_AMBULATORY_CARE_PROVIDER_SITE_OTHER): Payer: Medicare Other | Admitting: Family Medicine

## 2010-11-21 ENCOUNTER — Encounter: Payer: Self-pay | Admitting: Family Medicine

## 2010-11-21 DIAGNOSIS — J029 Acute pharyngitis, unspecified: Secondary | ICD-10-CM

## 2010-11-21 MED ORDER — NYSTATIN 100000 UNIT/ML MT SUSP: 500000.0000 [IU] | Freq: Four times a day (QID) | OROMUCOSAL | Status: DC

## 2010-11-21 MED ORDER — RABEPRAZOLE SODIUM 20 MG PO TBEC: 20.0000 mg | DELAYED_RELEASE_TABLET | Freq: Every day | ORAL | Status: DC

## 2010-11-21 NOTE — Patient Instructions (Signed)
Refilled aciphex.  Could be reflux causing hoarseness and sore throat. Also trial of nystatin swish and swallow. If not better with these measures, please follow up with Dr. [name]. For hands, continue therapy and follow up with Dr. [name] and Ronny Flurry.

## 2010-11-21 NOTE — Progress Notes (Signed)
  Subjective:    Patient ID: Eric [name], male    DOB: 12-09-1923, 77 y.o.   MRN: [id number]  HPI CC: ST  77yo T1DM presents with several month history of ST.  States going on for months now.  Has told PCP about this.  States making voice husky.  Not too sore this morning.  No fevers/chills, cough, sneezing.  + RN, nasal congestion as well.  No sick contacts at home.  No smokers at home.  No h/o COPD.  No h/o smoking.  Hand pain - hands feel numb on ends, longstanding.  Getting PT/OT at [address] for this and shoulder.  Has seen Dr. Ronny Flurry neurologist.  Lab Results  Component Value Date   HGBA1C 10.6* 10/19/2010   out of aciphex, requests refill.  Takes for GERD, ran out 1-2 mo ago.  To see PCP in October.  Review of Systems Per HPI    Objective:   Physical Exam  Constitutional: He appears well-developed and well-nourished. No distress.  HENT:  Head: Normocephalic and atraumatic.  Right Ear: Hearing, tympanic membrane, external ear and ear canal normal.  Left Ear: Hearing, tympanic membrane, external ear and ear canal normal.  Nose: Nose normal. No mucosal edema or rhinorrhea. Right sinus exhibits no maxillary sinus tenderness and no frontal sinus tenderness. Left sinus exhibits no maxillary sinus tenderness and no frontal sinus tenderness.  Mouth/Throat: Uvula is midline and oropharynx is clear and moist. No oropharyngeal exudate, posterior oropharyngeal edema, posterior oropharyngeal erythema or tonsillar abscesses.       Slight white film on tongue R side, none in pharynx  Eyes: Conjunctivae and EOM are normal. Pupils are equal, round, and reactive to light. No scleral icterus.  Neck: Normal range of motion. Neck supple. No thyromegaly present.  Cardiovascular: Normal rate, regular rhythm, normal heart sounds and intact distal pulses.   No murmur heard. Pulmonary/Chest: Effort normal and breath sounds normal. No respiratory distress. He has no wheezes. He has no rales.    Lymphadenopathy:    He has no cervical adenopathy.  Skin: Skin is warm and dry. No rash noted.          Assessment & Plan:

## 2010-11-21 NOTE — Assessment & Plan Note (Signed)
Longstanding. Minimal white film on tongue, treat with nystatin swish and swallow.  Last A1c out of control, increased risk of fungal infx. Could be GERD as out of PPI for 1-2 months, correlates with when ST started now.  Refilled, advised to start again. If not better, advised f/u with PCP on this.  No smoking hx.  For hands - advised f/u with neuro and PCP.

## 2010-11-23 ENCOUNTER — Encounter: Payer: Medicare Other | Admitting: Physical Therapy

## 2010-11-25 ENCOUNTER — Ambulatory Visit: Payer: Medicare Other | Admitting: Physical Therapy

## 2010-11-27 ENCOUNTER — Other Ambulatory Visit: Payer: Self-pay | Admitting: *Deleted

## 2010-11-27 MED ORDER — "INSULIN SYRINGE-NEEDLE U-100 31G X 5/16"" 0.3 ML MISC": Status: DC

## 2010-11-27 MED ORDER — HYDROCODONE-ACETAMINOPHEN 10-325 MG PO TABS: 1.0000 | ORAL_TABLET | ORAL | Status: DC | PRN

## 2010-11-27 NOTE — Telephone Encounter (Signed)
Rx faxed to [facility].  

## 2010-11-27 NOTE — Telephone Encounter (Signed)
Rc'd fax from [facility] for refill of Hydrocodone-APAP-last written 03/04/2010#50 with 1 refill-please advise  Last OV-10/19/2010

## 2010-12-02 ENCOUNTER — Encounter: Payer: Self-pay | Admitting: Internal Medicine

## 2010-12-02 ENCOUNTER — Ambulatory Visit (INDEPENDENT_AMBULATORY_CARE_PROVIDER_SITE_OTHER): Payer: Medicare Other | Admitting: Internal Medicine

## 2010-12-02 VITALS — BP 166/78 | HR 54 | Temp 97.5°F | Ht 70.0 in | Wt 147.8 lb

## 2010-12-02 DIAGNOSIS — E1039 Type 1 diabetes mellitus with other diabetic ophthalmic complication: Secondary | ICD-10-CM

## 2010-12-02 DIAGNOSIS — I10 Essential (primary) hypertension: Secondary | ICD-10-CM

## 2010-12-02 DIAGNOSIS — R197 Diarrhea, unspecified: Secondary | ICD-10-CM

## 2010-12-02 DIAGNOSIS — R110 Nausea: Secondary | ICD-10-CM

## 2010-12-02 MED ORDER — METRONIDAZOLE 500 MG PO TABS: 500.0000 mg | ORAL_TABLET | Freq: Three times a day (TID) | ORAL | Status: AC

## 2010-12-02 MED ORDER — LOPERAMIDE HCL 2 MG PO TABS: 2.0000 mg | ORAL_TABLET | Freq: Four times a day (QID) | ORAL | Status: AC | PRN

## 2010-12-02 MED ORDER — PROMETHAZINE HCL 12.5 MG PO TABS: 12.5000 mg | ORAL_TABLET | Freq: Four times a day (QID) | ORAL | Status: AC | PRN

## 2010-12-02 NOTE — Assessment & Plan Note (Signed)
BP Readings from Last 3 Encounters:  12/02/10 166/78  11/21/10 124/78  10/19/10 110/82   Increased today but will not change meds in setting of acute GI illnes

## 2010-12-02 NOTE — Assessment & Plan Note (Signed)
Reports no change in home cbgs with acute GI illness Lab Results  Component Value Date   HGBA1C 10.6* 10/19/2010   The current medical regimen is effective;  continue present plan and medications.

## 2010-12-02 NOTE — Progress Notes (Signed)
  Subjective:    Patient ID: Eric [name], male    DOB: 1923-12-26, 77 y.o.   MRN: [id number]  HPI  complains of diarrhea Onset 48h ago Yesterday 7-8 liquid BM, no melena or BRBPR No abd cramping or fever but chilled and sore throat; +sick contacts over weekend + nausea but no vomiting and good appetite ?improved this AM but not improved with OTC meds  Past Medical History  Diagnosis Date  . ANEMIA-NOS 02/17/2007  . DEPRESSION 12/25/2008  . HYPERTENSION 02/17/2007  . GERD 02/17/2007  . OSTEOARTHRITIS 03/04/2010  . DIABETES MELLITUS, TYPE I, CONTROLLED, WITH RETINOPATHY   . Gastroparesis   . BENIGN PROSTATIC HYPERTROPHY   . VITAMIN B12 DEFICIENCY   . BACK PAIN, LUMBAR   . LIVER DISORDER   . PSA, INCREASED      Review of Systems  Respiratory: Negative for cough and shortness of breath.   Cardiovascular: Negative for chest pain.  Genitourinary: Negative for hematuria.       Objective:   Physical Exam  BP 166/78  Pulse 54  Temp(Src) 97.5 F (36.4 C) (Oral)  Ht 5\' 10"  (1.778 m)  Wt 147 lb 12.8 oz (67.042 kg)  BMI 21.21 kg/m2  SpO2 95%  Constitutional:  oriented to person, place, and time. appears well-developed and well-nourished. No distress.  Neck: Normal range of motion. Neck supple. No JVD present. No thyromegaly present.  Cardiovascular: Normal rate, regular rhythm and normal heart sounds.  No murmur heard. Pulmonary/Chest: Effort normal and breath sounds normal. No respiratory distress. no wheezes.  Abdominal: Soft. Bowel sounds are normal. Patient exhibits no distension. There is no tenderness. No mass Skin: Skin is warm and dry.  No erythema or ulceration.  Psychiatric: he has a normal mood and affect. behavior is normal. Judgment and thought content normal.   Lab Results  Component Value Date   WBC 3.9* 12/09/2009   HGB 14.4 12/09/2009   HCT 41.7 12/09/2009   PLT 153.0 12/09/2009   CHOL 101 12/09/2009   TRIG 40.0 12/09/2009   HDL 36.00* 12/09/2009   LDLDIRECT 40.5 10/28/2006   ALT 23 01/08/2010   AST 22 01/08/2010   NA 138 12/09/2009   K 4.0 12/09/2009   CL 104 12/09/2009   CREATININE 1.1 12/09/2009   BUN 15 12/09/2009   CO2 27 12/09/2009   TSH 1.80 12/09/2009   PSA 2.94 12/09/2009   HGBA1C 10.6* 10/19/2010   MICROALBUR 6.1* 12/09/2009      Assessment & Plan:  Diarrhea x 48h, nausea without vomiting - no red flags in hx or exam - no recent antibiotics - tx with 7d empiric flagyl and rec OTC immodium; prn promethazine, BRAT diet - call if symptoms unimproved in next 1-2 weeks, sooner if worse - pt understands and agrees

## 2010-12-02 NOTE — Patient Instructions (Addendum)
It was good to see you today. Use Flagyl antibiotics 3x/day x 7 days, promethazine as needed for nausea and imodium pills as needed for loose stool - Your prescription(s) have been submitted to your pharmacy. Please take as directed and contact our office if you believe you are having problem(s) with the medication(s). If unimproved symptoms in next 1-2 weeks, call for reevaluation and labwork, call sooner if worse

## 2010-12-03 ENCOUNTER — Ambulatory Visit: Payer: Medicare Other | Admitting: Physical Therapy

## 2010-12-21 ENCOUNTER — Other Ambulatory Visit: Payer: Self-pay | Admitting: Endocrinology

## 2010-12-30 ENCOUNTER — Encounter: Payer: Self-pay | Admitting: Endocrinology

## 2010-12-30 ENCOUNTER — Ambulatory Visit (INDEPENDENT_AMBULATORY_CARE_PROVIDER_SITE_OTHER): Payer: Medicare Other | Admitting: Endocrinology

## 2010-12-30 ENCOUNTER — Telehealth: Payer: Self-pay | Admitting: Endocrinology

## 2010-12-30 ENCOUNTER — Other Ambulatory Visit (INDEPENDENT_AMBULATORY_CARE_PROVIDER_SITE_OTHER): Payer: Medicare Other

## 2010-12-30 VITALS — BP 146/72 | HR 59 | Temp 97.3°F | Ht 69.0 in | Wt 149.6 lb

## 2010-12-30 DIAGNOSIS — E1039 Type 1 diabetes mellitus with other diabetic ophthalmic complication: Secondary | ICD-10-CM

## 2010-12-30 LAB — HEMOGLOBIN A1C: Hgb A1c MFr Bld: 9.7 % — ABNORMAL HIGH (ref 4.6–6.5)

## 2010-12-30 MED ORDER — GLUCOSE BLOOD VI STRP: 1.0000 | ORAL_STRIP | Freq: Two times a day (BID) | Status: DC

## 2010-12-30 NOTE — Progress Notes (Signed)
Subjective:    Patient ID: Eric [name], male    DOB: June 12, 1923, 77 y.o.   MRN: [id number]  HPI Pt states 1 year of moderate pain at the hands, and assoc numbness.   He has seen drs yan (who ref to pt) and norris (who injected both shoulders).   no cbg record, but states cbg's are sometimes low in am. Past Medical History  Diagnosis Date  . ANEMIA-NOS 02/17/2007  . DEPRESSION 12/25/2008  . HYPERTENSION 02/17/2007  . GERD 02/17/2007  . OSTEOARTHRITIS 03/04/2010  . DIABETES MELLITUS, TYPE I, CONTROLLED, WITH RETINOPATHY   . Gastroparesis   . BENIGN PROSTATIC HYPERTROPHY   . VITAMIN B12 DEFICIENCY   . BACK PAIN, LUMBAR   . LIVER DISORDER   . PSA, INCREASED     Past Surgical History  Procedure Date  . Transurethral resection of prostate     History   Social History  . Marital Status: Widowed    Spouse Name: N/A    Number of Children: N/A  . Years of Education: N/A   Occupational History  . Not on file.   Social History Main Topics  . Smoking status: Never Smoker   . Smokeless tobacco: Not on file  . Alcohol Use: No  . Drug Use: No  . Sexually Active:    Other Topics Concern  . 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 aspirin 81 MG tablet Take 81 mg by mouth daily.        . carvedilol (COREG) 3.125 MG tablet Take 3.125 mg by mouth 2 (two) times daily with a meal.        . citalopram (CELEXA) 20 MG tablet Take 20 mg by mouth daily.        . clotrimazole (LOTRIMIN) 1 % cream Apply topically 2 (two) times daily.        . furosemide (LASIX) 80 MG tablet Take 80 mg by mouth daily.        . insulin NPH (HUMULIN N) 100 UNIT/ML injection Inject 18 Units into the skin 2 (two) times daily before a meal.       . Insulin Syringe-Needle U-100 31G X 5/16" 0.3 ML MISC Use as directed twice a day  60 each  5  . lactulose  (CHRONULAC) 10 GM/15ML solution Take 15 ml by mouth twice a day as needed       . lisinopril-hydrochlorothiazide (PRINZIDE,ZESTORETIC) 20-12.5 MG per tablet Take 2 tablets by mouth daily.        Marland Kitchen losartan (COZAAR) 100 MG tablet Take 100 mg by mouth daily.        . RABEprazole (ACIPHEX) 20 MG tablet Take 1 tablet (20 mg total) by mouth daily.  30 tablet  0    No Known Allergies  Family History  Problem Relation Age of Onset  . Cancer Father     had uncertain type of cancer  . Cancer Brother     Prostate Cancer  . Cancer Brother     Prostate Cancer   BP 146/72  Pulse 59  Temp(Src) 97.3 F (36.3 C) (Oral)  Ht 5\' 9"  (1.753 m)  Wt 149 lb 9.6 oz (67.858 kg)  BMI 22.09 kg/m2  SpO2 97%  Review of Systems He also has pain at the entire left lat thigh and left leg, and numbness there.  Objective:   Physical Exam VITAL SIGNS:  See vs page GENERAL: no distress Shoulders: full rom, but rom is painful. Hands: normal Pulses: radials are intact bilat.   Neuro: sensation is intact to touch on the hands and feet Gait: normal  Lab Results  Component Value Date   HGBA1C 9.7* 12/30/2010      Assessment & Plan:  Arthralgias, due to oa. Poss radicular sxs at the left lower extremity. prob bilat cts. Htn, prob exac by pain Dm.  Last a1c could have been affected by steroid injection.

## 2010-12-30 NOTE — Patient Instructions (Addendum)
blood tests are being ordered for you today.  please call [phone] to hear your test results.  You will be prompted to enter the 9-digit "MRN" number that appears at the top left of this page, followed by #.  Then you will hear the message. pending the test results, please change insulin to 18 units 2x a day (with first and ;last meals of the day). Please make a regular physical appointment in 3 months. Here is a new meter.  i have sent a prescription to your pharmacy, for strips. Take the pain medication as needed. (update: i left message on phone-tree:  rx as we discussed)

## 2010-12-30 NOTE — Telephone Encounter (Signed)
please call patient: Sugar is high.  Please increase insulin to 22 units with breakfast, and 16 with evening meal

## 2010-12-31 NOTE — Telephone Encounter (Signed)
Pt informed of MD's advisement. 

## 2010-12-31 NOTE — Telephone Encounter (Signed)
Left message for pt to callback office.  

## 2011-01-01 ENCOUNTER — Other Ambulatory Visit: Payer: Self-pay | Admitting: *Deleted

## 2011-01-01 MED ORDER — OXYCODONE-ACETAMINOPHEN 10-325 MG PO TABS: 1.0000 | ORAL_TABLET | ORAL | Status: AC | PRN

## 2011-01-01 NOTE — Telephone Encounter (Signed)
Pt states he was in office this week previously for hand pain and that his hand is now swollen. Pt is requesting a medication for pain/swelling because current medication is not helping-please advise

## 2011-01-01 NOTE — Telephone Encounter (Signed)
i printed 

## 2011-01-01 NOTE — Telephone Encounter (Signed)
Pt informed of new rx for pain, rx placed upfront in cabinet ready for pickup.

## 2011-01-03 ENCOUNTER — Inpatient Hospital Stay (INDEPENDENT_AMBULATORY_CARE_PROVIDER_SITE_OTHER)
Admission: RE | Admit: 2011-01-03 | Discharge: 2011-01-03 | Disposition: A | Discharge status: Home / Self Care | Payer: Medicare Other | Source: Ambulatory Visit | Attending: Family Medicine | Admitting: Family Medicine

## 2011-01-03 DIAGNOSIS — R609 Edema, unspecified: Secondary | ICD-10-CM

## 2011-01-03 DIAGNOSIS — I10 Essential (primary) hypertension: Secondary | ICD-10-CM

## 2011-01-10 ENCOUNTER — Emergency Department (HOSPITAL_COMMUNITY)
Admission: EM | Admit: 2011-01-10 | Discharge: 2011-01-11 | Disposition: A | Discharge status: Home / Self Care | Payer: Medicare Other | Attending: Emergency Medicine | Admitting: Emergency Medicine

## 2011-01-10 DIAGNOSIS — E119 Type 2 diabetes mellitus without complications: Secondary | ICD-10-CM | POA: Insufficient documentation

## 2011-01-10 DIAGNOSIS — R51 Headache: Secondary | ICD-10-CM | POA: Insufficient documentation

## 2011-01-10 DIAGNOSIS — I2510 Atherosclerotic heart disease of native coronary artery without angina pectoris: Secondary | ICD-10-CM | POA: Insufficient documentation

## 2011-01-10 DIAGNOSIS — K219 Gastro-esophageal reflux disease without esophagitis: Secondary | ICD-10-CM | POA: Insufficient documentation

## 2011-01-10 DIAGNOSIS — I10 Essential (primary) hypertension: Secondary | ICD-10-CM | POA: Insufficient documentation

## 2011-01-10 DIAGNOSIS — Z79899 Other long term (current) drug therapy: Secondary | ICD-10-CM | POA: Insufficient documentation

## 2011-01-10 LAB — CBC
HCT: 34.8 % — ABNORMAL LOW (ref 39.0–52.0)
Hemoglobin: 12.1 g/dL — ABNORMAL LOW (ref 13.0–17.0)
MCV: 92.1 fL (ref 78.0–100.0)
RDW: 12.5 % (ref 11.5–15.5)
WBC: 5.4 10*3/uL (ref 4.0–10.5)

## 2011-01-10 LAB — DIFFERENTIAL
Basophils Absolute: 0 10*3/uL (ref 0.0–0.1)
Eosinophils Relative: 1 % (ref 0–5)
Lymphocytes Relative: 34 % (ref 12–46)
Lymphs Abs: 1.8 10*3/uL (ref 0.7–4.0)
Neutro Abs: 3.4 10*3/uL (ref 1.7–7.7)

## 2011-01-11 LAB — BASIC METABOLIC PANEL
BUN: 16 mg/dL (ref 6–23)
CO2: 27 mEq/L (ref 19–32)
Chloride: 100 mEq/L (ref 96–112)
Creatinine, Ser: 1.18 mg/dL (ref 0.50–1.35)
Glucose, Bld: 213 mg/dL — ABNORMAL HIGH (ref 70–99)
Potassium: 3.8 mEq/L (ref 3.5–5.1)

## 2011-01-18 ENCOUNTER — Encounter: Payer: Medicare Other | Admitting: Endocrinology

## 2011-02-07 ENCOUNTER — Other Ambulatory Visit: Payer: Self-pay | Admitting: Endocrinology

## 2011-03-04 ENCOUNTER — Telehealth: Payer: Self-pay

## 2011-03-04 NOTE — Telephone Encounter (Signed)
[facility] Triage Call Report Triage Record Num: [id number] Operator: Vaughan Sine Patient Name: Archie Balboa Call Date & Time: 03/03/2011 9:35:07PM Patient Phone: [phone] PCP: Renato Shin Patient Gender: Male PCP Fax : [phone] Patient DOB: 08-Jun-1923 Practice Name: [facility] Reason for Call: Addendum to previous call. FSBS = 314 at 2136. Consulted w/ Dr Larose Kells, d/t BS over 300 after scheduled Humulin 16 units, advised Pt take additional Humulin N 5 units and f/u w/ Office on 03-04-11. Pt verbalized understanding. Protocol(s) Used: Diabetes: Control Problems Recommended Outcome per Protocol: See Provider within 4 hours Reason for Outcome: New or increasing symptoms or glucose out of control as defined by provider or action plan AND taking medications/following therapy as prescribed Care Advice: ~ 03/03/2011 9:43:26PM Page 1 of 1 [email or web address]

## 2011-03-04 NOTE — Telephone Encounter (Signed)
[facility] Triage Call Report Triage Record Num: [id number] Operator: Vaughan Sine Patient Name: Eric [name] Call Date & Time: 03/03/2011 8:24:26PM Patient Phone: [phone] PCP: Renato Shin Patient Gender: Male PCP Fax : [phone] Patient DOB: [birth year] Practice Name[facility] Reason for Call: Caller: Eric [name]/Patient; PCP: Renato Shin; CB#: [phone]; Call Reason: Did Not Take Insulin Today; Now BG Is 456; Sx Onset: 03/03/2011; Afebrile; Sx Notes: Pt calling about having FSBS = 456 at 1800 before dinner. Pt has not eaten. Advised Pt to reck FSBS = 417 at 2027. Pt has not taken nightly Insulin. Afebrile. Advised Pt to take scheduled nightly Humulin N 16 units. F/U call scheduled for 1 hr to reck FSBS. Pt alert and oriented. Protocol(s) Used: Diabetes: Control Problems Recommended Outcome per Protocol: See Provider within 4 hours Reason for Outcome: New or increasing symptoms OR glucose not within provider defined guidelines AND not taking medications/following treatment plan Care Advice: ~ 03/03/2011 8:34:34PM Page 1 of 1 [email or web address]

## 2011-03-23 ENCOUNTER — Ambulatory Visit: Payer: Medicare Other | Admitting: Endocrinology

## 2011-03-30 ENCOUNTER — Encounter: Payer: Medicare Other | Admitting: Endocrinology

## 2011-03-30 ENCOUNTER — Other Ambulatory Visit (INDEPENDENT_AMBULATORY_CARE_PROVIDER_SITE_OTHER): Payer: Medicare Other

## 2011-03-30 ENCOUNTER — Encounter: Payer: Self-pay | Admitting: Endocrinology

## 2011-03-30 ENCOUNTER — Ambulatory Visit (INDEPENDENT_AMBULATORY_CARE_PROVIDER_SITE_OTHER): Payer: Medicare Other | Admitting: Endocrinology

## 2011-03-30 DIAGNOSIS — K769 Liver disease, unspecified: Secondary | ICD-10-CM

## 2011-03-30 DIAGNOSIS — R7989 Other specified abnormal findings of blood chemistry: Secondary | ICD-10-CM

## 2011-03-30 DIAGNOSIS — I10 Essential (primary) hypertension: Secondary | ICD-10-CM

## 2011-03-30 DIAGNOSIS — N400 Enlarged prostate without lower urinary tract symptoms: Secondary | ICD-10-CM

## 2011-03-30 DIAGNOSIS — E538 Deficiency of other specified B group vitamins: Secondary | ICD-10-CM

## 2011-03-30 DIAGNOSIS — E1039 Type 1 diabetes mellitus with other diabetic ophthalmic complication: Secondary | ICD-10-CM

## 2011-03-30 DIAGNOSIS — R972 Elevated prostate specific antigen [PSA]: Secondary | ICD-10-CM

## 2011-03-30 DIAGNOSIS — D649 Anemia, unspecified: Secondary | ICD-10-CM

## 2011-03-30 DIAGNOSIS — R609 Edema, unspecified: Secondary | ICD-10-CM

## 2011-03-30 DIAGNOSIS — E785 Hyperlipidemia, unspecified: Secondary | ICD-10-CM

## 2011-03-30 DIAGNOSIS — Z79899 Other long term (current) drug therapy: Secondary | ICD-10-CM

## 2011-03-30 LAB — LIPID PANEL
Cholesterol: 173 mg/dL (ref 0–200)
LDL Cholesterol: 122 mg/dL — ABNORMAL HIGH (ref 0–99)
Triglycerides: 43 mg/dL (ref 0.0–149.0)

## 2011-03-30 LAB — CBC WITH DIFFERENTIAL/PLATELET
Basophils Relative: 0.3 % (ref 0.0–3.0)
Eosinophils Absolute: 0.1 10*3/uL (ref 0.0–0.7)
Hemoglobin: 13 g/dL (ref 13.0–17.0)
MCHC: 34.2 g/dL (ref 30.0–36.0)
MCV: 97.8 fl (ref 78.0–100.0)
Monocytes Absolute: 0.1 10*3/uL (ref 0.1–1.0)
Neutro Abs: 2.5 10*3/uL (ref 1.4–7.7)
RBC: 3.89 Mil/uL — ABNORMAL LOW (ref 4.22–5.81)

## 2011-03-30 LAB — MICROALBUMIN / CREATININE URINE RATIO
Creatinine,U: 72.7 mg/dL
Microalb Creat Ratio: 23.1 mg/g (ref 0.0–30.0)
Microalb, Ur: 16.8 mg/dL — ABNORMAL HIGH (ref 0.0–1.9)

## 2011-03-30 LAB — BASIC METABOLIC PANEL
BUN: 20 mg/dL (ref 6–23)
CO2: 26 mEq/L (ref 19–32)
Calcium: 9.1 mg/dL (ref 8.4–10.5)
Creatinine, Ser: 1.4 mg/dL (ref 0.4–1.5)
GFR: 62.43 mL/min (ref >60.00)
Glucose, Bld: 226 mg/dL — ABNORMAL HIGH (ref 70–99)

## 2011-03-30 LAB — URINALYSIS, ROUTINE W REFLEX MICROSCOPIC
Ketones, ur: NEGATIVE
Specific Gravity, Urine: 1.02 (ref 1.000–1.030)
Urine Glucose: 500
Urobilinogen, UA: 0.2 (ref 0.0–1.0)

## 2011-03-30 LAB — HEPATIC FUNCTION PANEL
ALT: 27 U/L (ref 0–53)
AST: 29 U/L (ref 0–37)
Albumin: 3.3 g/dL — ABNORMAL LOW (ref 3.5–5.2)
Alkaline Phosphatase: 85 U/L (ref 39–117)
Total Protein: 6.5 g/dL (ref 6.0–8.3)

## 2011-03-30 LAB — VITAMIN B12: Vitamin B-12: 235 pg/mL (ref 211–911)

## 2011-03-30 MED ORDER — ATORVASTATIN CALCIUM 20 MG PO TABS: 20.0000 mg | ORAL_TABLET | Freq: Every day | ORAL | Status: DC

## 2011-03-30 NOTE — Progress Notes (Signed)
Subjective:    Patient ID: Eric [name], male    DOB: 05-Jan-1924, 77 y.o.   MRN: [id number]  HPI Pt states few mos of slight swelling of the feet, and assoc pain. Pt returns for insulin-requiring DM (1997).  no cbg record, but states cbg's are "up and down."  He says he had 1 episode of hypoglycemia (56).  He says this was most likely before the evening meal, but he is not sure.  He says the cost of a medication is the primary consideration.   Past Medical History  Diagnosis Date  . ANEMIA-NOS 02/17/2007  . DEPRESSION 12/25/2008  . HYPERTENSION 02/17/2007  . GERD 02/17/2007  . OSTEOARTHRITIS 03/04/2010  . DIABETES MELLITUS, TYPE I, CONTROLLED, WITH RETINOPATHY   . Gastroparesis   . BENIGN PROSTATIC HYPERTROPHY   . VITAMIN B12 DEFICIENCY   . BACK PAIN, LUMBAR   . LIVER DISORDER   . PSA, INCREASED     Past Surgical History  Procedure Date  . Transurethral resection of prostate     History   Social History  . Marital Status: Widowed    Spouse Name: N/A    Number of Children: N/A  . Years of Education: N/A   Occupational History  . Not on file.   Social History Main Topics  . Smoking status: Never Smoker   . Smokeless tobacco: Not on file  . Alcohol Use: No  . Drug Use: No  . Sexually Active:    Other Topics Concern  . 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 aspirin 81 MG tablet Take 81 mg by mouth daily.        . carvedilol (COREG) 3.125 MG tablet Take 3.125 mg by mouth 2 (two) times daily with a meal.        . citalopram (CELEXA) 20 MG tablet Take 20 mg by mouth daily.        . clotrimazole (LOTRIMIN) 1 % cream Apply topically 2 (two) times daily.        . furosemide (LASIX) 80 MG tablet Take 80 mg by mouth daily.        . insulin NPH (HUMULIN N) 100 UNIT/ML injection Inject 18 Units into the skin 2 (two)  times daily before a meal.       . Insulin Syringe-Needle U-100 31G X 5/16" 0.3 ML MISC Use as directed twice a day  60 each  5  . lactulose (CHRONULAC) 10 GM/15ML solution Take 15 ml by mouth twice a day as needed       . lisinopril-hydrochlorothiazide (PRINZIDE,ZESTORETIC) 20-12.5 MG per tablet Take 2 tablets by mouth daily.        Marland Kitchen losartan (COZAAR) 100 MG tablet Take 100 mg by mouth daily.        . RABEprazole (ACIPHEX) 20 MG tablet Take 1 tablet (20 mg total) by mouth daily.  30 tablet  0    No Known Allergies  Family History  Problem Relation Age of Onset  . Cancer Father     had uncertain type of cancer  . Cancer Brother     Prostate Cancer  . Cancer Brother     Prostate Cancer    BP 126/88  Pulse 60  Temp(Src) 97.7 F (36.5 C) (Oral)  Ht 5\' 11"  (1.803 m)  Wt 154 lb 6.4 oz (70.035 kg)  BMI 21.53 kg/m2  SpO2 99%  Review of Systems Denies sob and loc.    Objective:   Physical Exam VITAL SIGNS:  See vs page. GENERAL: no distress. Pulses: dorsalis pedis intact bilat.   Feet: no deformity.  no ulcer on the feet.  feet are of normal color and temp.  1+ bilat leg edema.  There is bilateral onychomycosis. Neuro: sensation is intact to touch on the feet.    Lab Results  Component Value Date   WBC 4.2* 03/30/2011   HGB 13.0 03/30/2011   HCT 38.0* 03/30/2011   PLT 160.0 03/30/2011   GLUCOSE 226* 03/30/2011   CHOL 173 03/30/2011   TRIG 43.0 03/30/2011   HDL 42.70 03/30/2011   LDLDIRECT 40.5 10/28/2006   LDLCALC 122* 03/30/2011   ALT 27 03/30/2011   AST 29 03/30/2011   NA 134* 03/30/2011   K 3.7 03/30/2011   CL 101 03/30/2011   CREATININE 1.4 03/30/2011   BUN 20 03/30/2011   CO2 26 03/30/2011   TSH 2.39 03/30/2011   PSA 3.32 03/30/2011   HGBA1C 10.1* 03/30/2011   MICROALBUR 16.8* 03/30/2011  BNP=normal    Assessment & Plan:  DM.  therapy limited by noncompliance with cbg recording, and by pt's request for least expensive meds Edema,  localized Dyslipidemia, needs increased rx.

## 2011-03-30 NOTE — Patient Instructions (Addendum)
blood tests are being ordered for you today.  please call [phone] to hear your test results.  You will be prompted to enter the 9-digit "MRN" number that appears at the top left of this page, followed by #.  Then you will hear the message. pending the test results, please change insulin to 18 units 2x a day (with first and last meals of the day). Please make a regular physical appointment in 1 month.  check your blood sugar 2 times a day.  vary the time of day when you check, between before the 3 meals, and at bedtime.  also check if you have symptoms of your blood sugar being too high or too low.  please keep a record of the readings and bring it to your next appointment here.  please call us sooner if your blood sugar goes below 70, or if it stays over 200.  It is very important to your health that you bring the record of your blood sugar back to your next appointment. (update: i left message on phone-tree:  You should change to a better insulin, even if it is more expensive.  Add lipitor 20 mg daily).

## 2011-03-31 ENCOUNTER — Other Ambulatory Visit: Payer: Self-pay | Admitting: Endocrinology

## 2011-04-07 ENCOUNTER — Other Ambulatory Visit: Payer: Self-pay | Admitting: Endocrinology

## 2011-04-16 ENCOUNTER — Encounter: Payer: Self-pay | Admitting: Internal Medicine

## 2011-04-16 ENCOUNTER — Ambulatory Visit (INDEPENDENT_AMBULATORY_CARE_PROVIDER_SITE_OTHER): Payer: Medicare Other | Admitting: Internal Medicine

## 2011-04-16 VITALS — BP 170/80 | HR 76 | Temp 99.0°F | Resp 16 | Wt 155.0 lb

## 2011-04-16 DIAGNOSIS — J209 Acute bronchitis, unspecified: Secondary | ICD-10-CM

## 2011-04-16 MED ORDER — PROMETHAZINE-CODEINE 6.25-10 MG/5ML PO SYRP: 5.0000 mL | ORAL_SOLUTION | ORAL | Status: DC | PRN

## 2011-04-16 MED ORDER — AZITHROMYCIN 250 MG PO TABS: ORAL_TABLET | ORAL | Status: DC

## 2011-04-16 NOTE — Assessment & Plan Note (Signed)
12/12 Zpac Prom-cod syr

## 2011-04-16 NOTE — Progress Notes (Signed)
  Subjective:    Patient ID: Eric [name], male    DOB: Oct 10, 1923, 77 y.o.   MRN: [id number]  HPI  HPI  C/o URI sx's x   4 days. C/o ST, cough, weakness. Not better with OTC medicines. Actually, the patient is getting worse. The patient did not sleep last night due to cough. - yellow d/c  Review of Systems  Constitutional: Positive for fever 100, chills and fatigue.  HENT: Positive for congestion, rhinorrhea, sneezing and postnasal drip.   Eyes: Positive for photophobia and pain. Negative for discharge and visual disturbance.  Respiratory: Positive for cough   Gastrointestinal: Negative for vomiting, abdominal pain, diarrhea and abdominal distention.  Genitourinary: Negative for dysuria and difficulty urinating.  Skin: Negative for rash.  Neurological: Positive for  weakness - mild      Review of Systems     Objective:   Physical Exam  Constitutional: He is oriented to person, place, and time. He appears well-developed.  HENT:  Mouth/Throat: Oropharynx is clear and moist.  Eyes: Conjunctivae are normal. Pupils are equal, round, and reactive to light.  Neck: Normal range of motion. No JVD present. No thyromegaly present.  Cardiovascular: Normal rate, regular rhythm, normal heart sounds and intact distal pulses.  Exam reveals no gallop and no friction rub.   No murmur heard. Pulmonary/Chest: Effort normal and breath sounds normal. No respiratory distress. He has no wheezes. He has no rales. He exhibits no tenderness.  Abdominal: Soft. Bowel sounds are normal. He exhibits no distension and no mass. There is no tenderness. There is no rebound and no guarding.  Musculoskeletal: Normal range of motion. He exhibits no edema and no tenderness.  Lymphadenopathy:    He has no cervical adenopathy.  Neurological: He is alert and oriented to person, place, and time. He has normal reflexes. No cranial nerve deficit. He exhibits normal muscle tone. Coordination normal.  Skin: Skin is warm  and dry. No rash noted.  Psychiatric: He has a normal mood and affect. His behavior is normal. Judgment and thought content normal.          Assessment & Plan:

## 2011-04-16 NOTE — Patient Instructions (Signed)
Use over-the-counter  "cold" medicines  such as"Afrin" nasal spray for nasal congestion as directed instead. Use" Delsym" or" Robitussin" cough syrup varietis for cough.  You can use plain "Tylenol" or "Advil' for fever, chills and achyness.   

## 2011-04-19 ENCOUNTER — Emergency Department (HOSPITAL_COMMUNITY): Payer: Medicare Other

## 2011-04-19 ENCOUNTER — Encounter (HOSPITAL_COMMUNITY): Payer: Self-pay

## 2011-04-19 ENCOUNTER — Other Ambulatory Visit: Payer: Self-pay

## 2011-04-19 ENCOUNTER — Inpatient Hospital Stay (HOSPITAL_COMMUNITY)
Admission: EM | Admit: 2011-04-19 | Discharge: 2011-04-24 | DRG: 280 | Disposition: A | Discharge status: Home / Self Care | Payer: Medicare Other | Attending: Internal Medicine | Admitting: Internal Medicine

## 2011-04-19 DIAGNOSIS — J189 Pneumonia, unspecified organism: Secondary | ICD-10-CM | POA: Diagnosis present

## 2011-04-19 DIAGNOSIS — IMO0002 Reserved for concepts with insufficient information to code with codable children: Secondary | ICD-10-CM | POA: Diagnosis present

## 2011-04-19 DIAGNOSIS — Z794 Long term (current) use of insulin: Secondary | ICD-10-CM

## 2011-04-19 DIAGNOSIS — F329 Major depressive disorder, single episode, unspecified: Secondary | ICD-10-CM | POA: Diagnosis present

## 2011-04-19 DIAGNOSIS — I129 Hypertensive chronic kidney disease with stage 1 through stage 4 chronic kidney disease, or unspecified chronic kidney disease: Secondary | ICD-10-CM | POA: Diagnosis present

## 2011-04-19 DIAGNOSIS — E785 Hyperlipidemia, unspecified: Secondary | ICD-10-CM | POA: Diagnosis present

## 2011-04-19 DIAGNOSIS — F3289 Other specified depressive episodes: Secondary | ICD-10-CM | POA: Diagnosis present

## 2011-04-19 DIAGNOSIS — D649 Anemia, unspecified: Secondary | ICD-10-CM | POA: Diagnosis not present

## 2011-04-19 DIAGNOSIS — K219 Gastro-esophageal reflux disease without esophagitis: Secondary | ICD-10-CM | POA: Diagnosis present

## 2011-04-19 DIAGNOSIS — E871 Hypo-osmolality and hyponatremia: Secondary | ICD-10-CM

## 2011-04-19 DIAGNOSIS — E876 Hypokalemia: Secondary | ICD-10-CM | POA: Diagnosis not present

## 2011-04-19 DIAGNOSIS — N179 Acute kidney failure, unspecified: Secondary | ICD-10-CM | POA: Diagnosis present

## 2011-04-19 DIAGNOSIS — I2510 Atherosclerotic heart disease of native coronary artery without angina pectoris: Secondary | ICD-10-CM | POA: Diagnosis present

## 2011-04-19 DIAGNOSIS — R5381 Other malaise: Secondary | ICD-10-CM | POA: Diagnosis present

## 2011-04-19 DIAGNOSIS — E11319 Type 2 diabetes mellitus with unspecified diabetic retinopathy without macular edema: Secondary | ICD-10-CM | POA: Diagnosis present

## 2011-04-19 DIAGNOSIS — H539 Unspecified visual disturbance: Secondary | ICD-10-CM | POA: Diagnosis present

## 2011-04-19 DIAGNOSIS — I214 Non-ST elevation (NSTEMI) myocardial infarction: Principal | ICD-10-CM | POA: Diagnosis present

## 2011-04-19 DIAGNOSIS — E1039 Type 1 diabetes mellitus with other diabetic ophthalmic complication: Secondary | ICD-10-CM

## 2011-04-19 DIAGNOSIS — N400 Enlarged prostate without lower urinary tract symptoms: Secondary | ICD-10-CM | POA: Diagnosis present

## 2011-04-19 DIAGNOSIS — M1990 Unspecified osteoarthritis, unspecified site: Secondary | ICD-10-CM | POA: Diagnosis present

## 2011-04-19 DIAGNOSIS — Z7982 Long term (current) use of aspirin: Secondary | ICD-10-CM

## 2011-04-19 DIAGNOSIS — R531 Weakness: Secondary | ICD-10-CM

## 2011-04-19 DIAGNOSIS — R739 Hyperglycemia, unspecified: Secondary | ICD-10-CM

## 2011-04-19 DIAGNOSIS — N183 Chronic kidney disease, stage 3 unspecified: Secondary | ICD-10-CM | POA: Diagnosis present

## 2011-04-19 DIAGNOSIS — R799 Abnormal finding of blood chemistry, unspecified: Secondary | ICD-10-CM | POA: Diagnosis present

## 2011-04-19 DIAGNOSIS — I10 Essential (primary) hypertension: Secondary | ICD-10-CM | POA: Diagnosis present

## 2011-04-19 LAB — CARDIAC PANEL(CRET KIN+CKTOT+MB+TROPI)
Relative Index: 2.1 (ref 0.0–2.5)
Total CK: 649 U/L — ABNORMAL HIGH (ref 7–232)
Troponin I: 2.91 ng/mL (ref <0.30)
Troponin I: 3.27 ng/mL (ref <0.30)

## 2011-04-19 LAB — DIFFERENTIAL
Basophils Absolute: 0 10*3/uL (ref 0.0–0.1)
Basophils Relative: 0 % (ref 0–1)
Eosinophils Absolute: 0 10*3/uL (ref 0.0–0.7)
Monocytes Relative: 3 % (ref 3–12)
Neutrophils Relative %: 86 % — ABNORMAL HIGH (ref 43–77)

## 2011-04-19 LAB — BASIC METABOLIC PANEL
BUN: 21 mg/dL (ref 6–23)
Creatinine, Ser: 1.48 mg/dL — ABNORMAL HIGH (ref 0.50–1.35)
GFR calc Af Amer: 48 mL/min — ABNORMAL LOW (ref >90)
GFR calc non Af Amer: 41 mL/min — ABNORMAL LOW (ref >90)
Potassium: 3.7 mEq/L (ref 3.5–5.1)

## 2011-04-19 LAB — CBC
Hemoglobin: 12.3 g/dL — ABNORMAL LOW (ref 13.0–17.0)
MCH: 31.9 pg (ref 26.0–34.0)
MCHC: 35.4 g/dL (ref 30.0–36.0)
Platelets: 141 10*3/uL — ABNORMAL LOW (ref 150–400)
RBC: 3.41 MIL/uL — ABNORMAL LOW (ref 4.22–5.81)
RDW: 12.3 % (ref 11.5–15.5)
RDW: 12.3 % (ref 11.5–15.5)
WBC: 3.6 10*3/uL — ABNORMAL LOW (ref 4.0–10.5)

## 2011-04-19 LAB — URINALYSIS, ROUTINE W REFLEX MICROSCOPIC
Bilirubin Urine: NEGATIVE
Nitrite: NEGATIVE
Specific Gravity, Urine: 1.022 (ref 1.005–1.030)
Urobilinogen, UA: 0.2 mg/dL (ref 0.0–1.0)

## 2011-04-19 LAB — GLUCOSE, CAPILLARY
Glucose-Capillary: 343 mg/dL — ABNORMAL HIGH (ref 70–99)
Glucose-Capillary: 272 mg/dL — ABNORMAL HIGH (ref 70–99)
Glucose-Capillary: 177 mg/dL — ABNORMAL HIGH (ref 70–99)

## 2011-04-19 LAB — CREATININE, SERUM
GFR calc Af Amer: 55 mL/min — ABNORMAL LOW (ref >90)
GFR calc non Af Amer: 47 mL/min — ABNORMAL LOW (ref >90)

## 2011-04-19 LAB — TROPONIN I: Troponin I: 0.75 ng/mL (ref <0.30)

## 2011-04-19 LAB — PROTIME-INR: Prothrombin Time: 13.6 seconds (ref 11.6–15.2)

## 2011-04-19 LAB — URINE MICROSCOPIC-ADD ON

## 2011-04-19 LAB — TSH: TSH: 1.514 u[IU]/mL (ref 0.350–4.500)

## 2011-04-19 MED ORDER — ASPIRIN 81 MG PO CHEW: 81.0000 mg | CHEWABLE_TABLET | Freq: Every day | ORAL | Status: DC

## 2011-04-19 MED ORDER — CITALOPRAM HYDROBROMIDE 20 MG PO TABS
20.0000 mg | ORAL_TABLET | Freq: Every day | ORAL | Status: DC
  Administered 2011-04-19 – 2011-04-24 (×5): 20 mg via ORAL
  Filled 2011-04-19 (×7): qty 1

## 2011-04-19 MED ORDER — SODIUM CHLORIDE 0.9 % IJ SOLN
3.0000 mL | INTRAMUSCULAR | Status: DC | PRN
  Administered 2011-04-23: 10 mL via INTRAVENOUS

## 2011-04-19 MED ORDER — NITROGLYCERIN 0.4 MG SL SUBL: 0.4000 mg | SUBLINGUAL_TABLET | SUBLINGUAL | Status: DC | PRN

## 2011-04-19 MED ORDER — SODIUM CHLORIDE 0.9 % IV SOLN: 250.0000 mL | INTRAVENOUS | Status: DC | PRN

## 2011-04-19 MED ORDER — IPRATROPIUM BROMIDE 0.02 % IN SOLN: 0.5000 mg | Freq: Four times a day (QID) | RESPIRATORY_TRACT | Status: DC | PRN

## 2011-04-19 MED ORDER — PANTOPRAZOLE SODIUM 40 MG PO TBEC
40.0000 mg | DELAYED_RELEASE_TABLET | Freq: Every day | ORAL | Status: DC
  Administered 2011-04-19 – 2011-04-24 (×5): 40 mg via ORAL
  Filled 2011-04-19 (×7): qty 1

## 2011-04-19 MED ORDER — MOXIFLOXACIN HCL IN NACL 400 MG/250ML IV SOLN
400.0000 mg | Freq: Once | INTRAVENOUS | Status: AC
  Administered 2011-04-19: 400 mg via INTRAVENOUS
  Filled 2011-04-19: qty 250

## 2011-04-19 MED ORDER — INSULIN NPH (HUMAN) (ISOPHANE) 100 UNIT/ML ~~LOC~~ SUSP
18.0000 [IU] | Freq: Two times a day (BID) | SUBCUTANEOUS | Status: DC
  Administered 2011-04-20 (×2): 18 [IU] via SUBCUTANEOUS
  Filled 2011-04-19: qty 10

## 2011-04-19 MED ORDER — ACETAMINOPHEN 325 MG PO TABS: 650.0000 mg | ORAL_TABLET | ORAL | Status: DC | PRN

## 2011-04-19 MED ORDER — SODIUM CHLORIDE 0.9 % IV SOLN
INTRAVENOUS | Status: DC
  Administered 2011-04-19 (×4): via INTRAVENOUS

## 2011-04-19 MED ORDER — LACTULOSE 10 GM/15ML PO SOLN
10.0000 g | Freq: Two times a day (BID) | ORAL | Status: DC | PRN
  Administered 2011-04-21: 10 g via ORAL
  Filled 2011-04-19 (×2): qty 15

## 2011-04-19 MED ORDER — ASPIRIN 81 MG PO CHEW
324.0000 mg | CHEWABLE_TABLET | Freq: Once | ORAL | Status: AC
  Administered 2011-04-19: 324 mg via ORAL
  Filled 2011-04-19: qty 4

## 2011-04-19 MED ORDER — FUROSEMIDE 80 MG PO TABS
80.0000 mg | ORAL_TABLET | Freq: Every day | ORAL | Status: DC
  Filled 2011-04-19 (×2): qty 1

## 2011-04-19 MED ORDER — LEVOFLOXACIN IN D5W 750 MG/150ML IV SOLN: 750.0000 mg | INTRAVENOUS | Status: DC

## 2011-04-19 MED ORDER — CLOTRIMAZOLE 1 % EX CREA
1.0000 "application " | TOPICAL_CREAM | Freq: Two times a day (BID) | CUTANEOUS | Status: DC
  Administered 2011-04-19 – 2011-04-24 (×9): 1 via TOPICAL
  Filled 2011-04-19 (×3): qty 15

## 2011-04-19 MED ORDER — ONDANSETRON HCL 4 MG/2ML IJ SOLN: 4.0000 mg | Freq: Four times a day (QID) | INTRAMUSCULAR | Status: DC | PRN

## 2011-04-19 MED ORDER — INFLUENZA VIRUS VACC SPLIT PF IM SUSP
0.5000 mL | INTRAMUSCULAR | Status: AC
  Administered 2011-04-20: 0.5 mL via INTRAMUSCULAR
  Filled 2011-04-19: qty 0.5

## 2011-04-19 MED ORDER — PNEUMOCOCCAL VAC POLYVALENT 25 MCG/0.5ML IJ INJ
0.5000 mL | INJECTION | INTRAMUSCULAR | Status: AC
  Administered 2011-04-20: 0.5 mL via INTRAMUSCULAR
  Filled 2011-04-19: qty 0.5

## 2011-04-19 MED ORDER — ALBUTEROL SULFATE (5 MG/ML) 0.5% IN NEBU: 2.5000 mg | INHALATION_SOLUTION | Freq: Four times a day (QID) | RESPIRATORY_TRACT | Status: DC | PRN

## 2011-04-19 MED ORDER — LEVOFLOXACIN IN D5W 750 MG/150ML IV SOLN
750.0000 mg | INTRAVENOUS | Status: DC
  Administered 2011-04-21 – 2011-04-23 (×2): 750 mg via INTRAVENOUS
  Filled 2011-04-19 (×2): qty 150

## 2011-04-19 MED ORDER — ASPIRIN EC 81 MG PO TBEC
81.0000 mg | DELAYED_RELEASE_TABLET | Freq: Every day | ORAL | Status: DC
  Administered 2011-04-19 – 2011-04-24 (×5): 81 mg via ORAL
  Filled 2011-04-19 (×7): qty 1

## 2011-04-19 MED ORDER — LOSARTAN POTASSIUM 50 MG PO TABS
100.0000 mg | ORAL_TABLET | Freq: Every day | ORAL | Status: DC
  Administered 2011-04-19 – 2011-04-20 (×2): 100 mg via ORAL
  Filled 2011-04-19 (×3): qty 2

## 2011-04-19 MED ORDER — ENOXAPARIN SODIUM 40 MG/0.4ML ~~LOC~~ SOLN: 40.0000 mg | SUBCUTANEOUS | Status: DC

## 2011-04-19 MED ORDER — CARVEDILOL 3.125 MG PO TABS
3.1250 mg | ORAL_TABLET | Freq: Two times a day (BID) | ORAL | Status: DC
  Administered 2011-04-19 – 2011-04-20 (×2): 3.125 mg via ORAL
  Filled 2011-04-19 (×3): qty 1

## 2011-04-19 MED ORDER — SODIUM CHLORIDE 0.9 % IJ SOLN
3.0000 mL | Freq: Two times a day (BID) | INTRAMUSCULAR | Status: DC
  Administered 2011-04-19 – 2011-04-24 (×2): 3 mL via INTRAVENOUS

## 2011-04-19 MED ORDER — SODIUM CHLORIDE 0.9 % IV BOLUS (SEPSIS)
500.0000 mL | Freq: Once | INTRAVENOUS | Status: AC
  Administered 2011-04-19: 500 mL via INTRAVENOUS

## 2011-04-19 MED ORDER — INSULIN ASPART 100 UNIT/ML ~~LOC~~ SOLN
0.0000 [IU] | Freq: Every day | SUBCUTANEOUS | Status: DC
  Administered 2011-04-19: 3 [IU] via SUBCUTANEOUS
  Administered 2011-04-23: 2 [IU] via SUBCUTANEOUS
  Filled 2011-04-19: qty 3

## 2011-04-19 MED ORDER — METOPROLOL TARTRATE 25 MG PO TABS
25.0000 mg | ORAL_TABLET | Freq: Two times a day (BID) | ORAL | Status: DC
  Filled 2011-04-19: qty 1

## 2011-04-19 MED ORDER — SODIUM CHLORIDE 0.9 % IV SOLN
INTRAVENOUS | Status: DC
  Administered 2011-04-19: 13:00:00 via INTRAVENOUS

## 2011-04-19 MED ORDER — ROSUVASTATIN CALCIUM 20 MG PO TABS
20.0000 mg | ORAL_TABLET | Freq: Every day | ORAL | Status: DC
  Administered 2011-04-19 – 2011-04-22 (×4): 20 mg via ORAL
  Filled 2011-04-19 (×7): qty 1

## 2011-04-19 MED ORDER — INSULIN ASPART 100 UNIT/ML ~~LOC~~ SOLN
0.0000 [IU] | Freq: Three times a day (TID) | SUBCUTANEOUS | Status: DC
  Administered 2011-04-19: 5 [IU] via SUBCUTANEOUS
  Administered 2011-04-20: 2 [IU] via SUBCUTANEOUS
  Administered 2011-04-20: 3 [IU] via SUBCUTANEOUS
  Administered 2011-04-20: 2 [IU] via SUBCUTANEOUS
  Administered 2011-04-21: 1 [IU] via SUBCUTANEOUS
  Administered 2011-04-22 – 2011-04-24 (×3): 2 [IU] via SUBCUTANEOUS
  Administered 2011-04-24: 3 [IU] via SUBCUTANEOUS
  Filled 2011-04-19: qty 1

## 2011-04-19 MED ORDER — HEPARIN BOLUS VIA INFUSION
4000.0000 [IU] | Freq: Once | INTRAVENOUS | Status: AC
  Administered 2011-04-19: 4000 [IU] via INTRAVENOUS
  Filled 2011-04-19: qty 4000

## 2011-04-19 MED ORDER — SIMVASTATIN 20 MG PO TABS: 20.0000 mg | ORAL_TABLET | Freq: Every day | ORAL | Status: DC

## 2011-04-19 MED ORDER — HEPARIN SOD (PORCINE) IN D5W 100 UNIT/ML IV SOLN
12.0000 [IU]/kg/h | INTRAVENOUS | Status: DC
  Administered 2011-04-19: 12 [IU]/kg/h via INTRAVENOUS
  Filled 2011-04-19: qty 250

## 2011-04-19 MED ORDER — LEVOFLOXACIN IN D5W 500 MG/100ML IV SOLN: 500.0000 mg | INTRAVENOUS | Status: DC

## 2011-04-19 NOTE — Progress Notes (Signed)
Antibiotic Renal Adjustment:  Levoquin adjusted for renal function.  Changed to 750 mg IV Q48h for CrCl between 20-49  Kindle Strohmeier, Gaye Alken PharmD 12:21 PM 04/19/2011

## 2011-04-19 NOTE — ED Notes (Signed)
CBG 343 

## 2011-04-19 NOTE — Consult Note (Signed)
Cardiology Consult Note   Patient ID: Eric [name] MRN: [id number], DOB/AGE: [birth year]   Admit date: 04/19/2011 Date of Consult: 04/19/2011  Primary Physician: Renato Shin, MD, MD Primary Cardiologist: New  Pt. Profile: Eric [name] is a 77 yo male without a previous comprehensive cardiac work-up (normal ECHO 09/10 revealed EF 55-60%, no WMA, no LVH) and PMHx significant for type 2 DM (uncontrolled due to noncompliance), HTN and HL who was transported to [facility] ED today with weakness, malaise and chills.   Reason for consult: NSTEMI  Problem List: Past Medical History  Diagnosis Date  . ANEMIA-NOS 02/17/2007  . DEPRESSION 12/25/2008  . HYPERTENSION 02/17/2007  . GERD 02/17/2007  . OSTEOARTHRITIS 03/04/2010  . DIABETES MELLITUS, TYPE I, CONTROLLED, WITH RETINOPATHY   . Gastroparesis   . BENIGN PROSTATIC HYPERTROPHY   . VITAMIN B12 DEFICIENCY   . BACK PAIN, LUMBAR   . LIVER DISORDER   . PSA, INCREASED     Past Surgical History  Procedure Date  . Transurethral resection of prostate      Allergies: No Known Allergies  HPI:   Pt reports a 6 day history of increased weakness, malaise, nasal congestion and productive cough with yellow sputum.  He was recently seen by PCP on 12/28 c/o worsening cough, malaise, weakness, fatigue.  Diagnosed with acute bronchitis and started on Zithromax. This morning he awoke, and while driving began experiencing chills, lightheadedness and vision changes. He pulled over and proceeded to call EMS. He was subsequently transported to [facility] ED. He endorses associated palpitations. He reports chronic dependent edema. He denies chest pain, SOB, DOE, orthopnea, PND and wheezing. He states an episode similar to this has occurred in the past when his BGs were elevated.   Upon arrival in the ED, he was found to be mildly hyperglycemia in the 300s with elevated Cr. POC TnI elevated at 0.75, CE pos x 1 with increased TnI to 2.91, CK-MB 14.4. EKG with possible ST  depressions, other NSR with nonspecific changes. CXR revealed patchy airspace opacities in the RLL consistent with pneumonia. He was started on  Levaquin, nebs, insulin, full dose ASA, home BB. He currently endorses malaise and productive cough, but is otherwise asymptomatic.    Inpatient Medications:     . sodium chloride   Intravenous STAT  . aspirin  324 mg Oral Once  . insulin aspart  0-5 Units Subcutaneous QHS  . insulin aspart  0-9 Units Subcutaneous TID WC  . insulin NPH  18 Units Subcutaneous BID AC  . levofloxacin (LEVAQUIN) IV  750 mg Intravenous Q48H  . moxifloxacin  400 mg Intravenous Once  . sodium chloride  500 mL Intravenous Once  . DISCONTD: levofloxacin (LEVAQUIN) IV  500 mg Intravenous Q24H  . DISCONTD: levofloxacin (LEVAQUIN) IV  750 mg Intravenous Q48H    (Not in a hospital admission)  Family History  Problem Relation Age of Onset  . Cancer Father     had uncertain type of cancer  . Cancer Brother     Prostate Cancer  . Cancer Brother     Prostate Cancer     History   Social History  . Marital Status: Widowed    Spouse Name: N/A    Number of Children: N/A  . Years of Education: N/A   Occupational History  . Not on file.   Social History Main Topics  . Smoking status: Never Smoker   . Smokeless tobacco: Not on file  . Alcohol Use: No  .  Drug Use: No  . Sexually Active:    Other Topics Concern  . Not on file   Social History Narrative   Works Armed forces operational officer.Widowed 2010.Regular exercise-no     Review of Systems: General: positive for chills, negative fever, night sweats or weight changes.  Cardiovascular: positive for palpitations, lightheadedness, negative for chest pain, dyspnea on exertion, edema, orthopnea, paroxysmal nocturnal dyspnea or shortness of breath Dermatological: negative for rash Respiratory: positive for productive cough, negative for wheezing Urologic: negative for hematuria Abdominal: positive for nausea, diarrhea x  1 day, negative for vomiting, bright red blood per rectum, melena, or hematemesis Neurologic: negative for visual changes, syncope, or dizziness All other systems reviewed and are otherwise negative except as noted above.  Physical Exam: Blood pressure 161/80, pulse 89, temperature 99.8 F (37.7 C), temperature source Oral, resp. rate 15, height 5\' 11"  (1.803 m), weight 70.308 kg (155 lb), SpO2 94.00%.  General: Elderly, NAD Head: Normocephalic, atraumatic, sclera non-icteric, no xanthomas Neck: Negative for carotid bruits. JVD not elevated. Supple.  Lungs: Right basilar rales, otherwise no wheezing or rhonchi. Breathing is unlabored. Heart: RRR with S1 S2. No murmurs, rubs, or gallops appreciated. Abdomen: Soft, non-tender, non-distended with normoactive bowel sounds. No hepatomegaly. No rebound/guarding. No obvious abdominal masses. Msk:  Strength and tone appears normal for age. Extremities: 1+ pitting edema to above ankle; no clubbing or cyanosis.  Distal pedal pulses are 2+ and equal bilaterally. Neuro: Alert and oriented X 3. Moves all extremities spontaneously. Psych:  Responds to questions appropriately with a normal affect.  Labs: Recent Labs  Basename 04/19/11 1215 04/19/11 0829   WBC 3.6* 4.9   HGB 10.8* 12.3*   HCT 30.5* 34.7*   MCV 89.4 89.9   PLT 141* 166   No results found for this basename: VITAMINB12,FOLATE,FERRITIN,TIBC,IRON,RETICCTPCT in the last 72 hours No results found for this basename: DDIMER:2 in the last 72 hours  Lab 04/19/11 1215 04/19/11 0829  NA -- 128*  K -- 3.7  CL -- 94*  CO2 -- 23  BUN -- 21  CREATININE 1.32 1.48*  CALCIUM -- 8.6  PROT -- --  BILITOT -- --  ALKPHOS -- --  ALT -- --  AST -- --  AMYLASE -- --  LIPASE -- --  GLUCOSE -- 311*   No results found for this basename: HGBA1C in the last 72 hours Recent Labs  Basename 04/19/11 1215 04/19/11 0829   CKTOTAL 649* --   CKMB 14.4* --   CKMBINDEX -- --   TROPONINI 2.91* 0.75*    No components found with this basename: POCBNP No results found for this basename: CHOL,HDL,LDLCALC,TRIG,CHOLHDL,LDLDIRECT in the last 72 hours No results found for this basename: TSH,T4TOTAL,FREET3,T3FREE,THYROIDAB in the last 72 hours  Radiology/Studies: Dg Chest 2 View  04/19/2011  *RADIOLOGY REPORT*  Clinical Data: Generalized weakness.  Cough.  Shortness of breath. Chest congestion.  CHEST - 2 VIEW 04/19/2011:  Comparison: Two-view chest x-ray 12/25/2008, 06/28/2007, 04/18/2007, 10/04/2006 [place].  Findings: Patchy airspace opacities in the right lower lobe, new since the prior examinations.  Lungs remain clear otherwise. Cardiac silhouette upper normal in size for the AP technique, unchanged.  Thoracic aorta atherosclerotic, unchanged.  Hilar and mediastinal contours otherwise.  No pleural effusions. Degenerative changes involving the thoracic  IMPRESSION: Right lower lobe pneumonia.  Original Report Authenticated By: Deniece Portela, M.D.   Ct Head Wo Contrast  04/19/2011  *RADIOLOGY REPORT*  Clinical Data: Weakness.  Upper respiratory tract infection.  CT HEAD  WITHOUT CONTRAST  Technique:  Contiguous axial images were obtained from the base of the skull through the vertex without contrast.  Comparison: None.  Findings: The brain shows generalized atrophy.  There is mild chronic small vessel disease affecting the hemispheric deep white matter.  No sign of acute infarction, mass lesion, hemorrhage, hydrocephalus or extra-axial collection.  No skull fracture.  There is minimal mucosal inflammation of the paranasal sinuses.  IMPRESSION: No acute or reversible findings.  Age related atrophy and chronic small vessel disease.  Original Report Authenticated By: Jules Schick, M.D.    EKG: NSR, 194 bpm, nonspecific changes, ? ST depression V5, V6  ASSESSMENT AND PLAN:   1. NSTEMI- pt's symptom's are atypical. Palpitations, lightheadedness and weakness may be pt's anginal  equivalent. He is also a long-standing diabetic and could very well present atypically. In addition to significant cardiac risk factors, he has had minimal cardiac work-up in the past. POC TnI and CE elevated x 1 today. EKG unremarkable.   - Will admit to telemetry  - Will cycle CEs, serial EKGs  - Hep gtt  - Continue daily ASA, BB, statin  - Nitro PRN  - Will order echocardiogram  - Will schedule pt for cath tomorrow  2. RLL Pneumonia- pt with productive cough, chills; rales on exam, evidenced by CXR in the ED  - Will continue Levaquin  - Nebs PRN  - Daily CBCs  3. Chronic kidney disease, stage III- seemingly at baseline from prior labs, likely sequela of longstanding type 2 DM  - Will hydrate and monitor   4. Type 2 DM- poorly controlled, pt admittedly has not been taking prescribed amount of insulin at home, found to be elevated in 300s in ED today   - SSI per protocol  5. HTN- borderline high in the ED  - Continue BB  Signed, R. Valeria Batman, PA-C 04/19/2011, 2:01 PM  As above; agents seen and examined. 77 year old male with no prior cardiac history for evaluation of non-ST elevation myocardial infarction. Patient has had a recent URI. He complains of congestion and productive cough. He has been treated with antibiotics. While driving today he developed sudden onset of generalized weakness and mild dizziness. He denies dyspnea or chest pain. No syncope. His cardiac markers have been found to be elevated and cardiology is asked to evaluate. Physical exam significant for chest with mild diffuse rhonchi, cardiovascular exam with regular rate and rhythm and S4 and extremities with no edema. Electrocardiogram shows sinus rhythm at a rate of 94 and nonspecific ST changes. Troponin I is 2.91. CK total 649 with MB of 14.4. BUN and creatinine 21 and 1.48 respectively. Chest x-ray shows right lower lobe pneumonia. Would advise continue therapy for pneumonia. Check echocardiogram for LV  function. Treat with aspirin, heparin, beta blocker and statin. Patient is is very functional. Will proceed with cardiac catheterization later this week for pneumonia improves. The risks and benefits were discussed and the patient agrees to proceed. Follow renal function prior to and following procedure closely. Eric [name]

## 2011-04-19 NOTE — ED Notes (Signed)
Notified dr.wentz of troponin level 0.75

## 2011-04-19 NOTE — ED Notes (Signed)
CBG - 141 ° °

## 2011-04-19 NOTE — ED Notes (Signed)
Pt brought by ems with c/o weakness and shaky. Pt was leaving work and driving when he suddenly started feeling weak and shaky.

## 2011-04-19 NOTE — ED Notes (Signed)
[id number] Expected date:04/19/11<BR> Expected time: 6:47 AM<BR> Means of arrival:Ambulance<BR> Comments:<BR>

## 2011-04-19 NOTE — Progress Notes (Signed)
ANTICOAGULATION CONSULT NOTE - Initial Consult  Pharmacy Consult for Heparin Indication: NSTEMI  No Known Allergies  Patient Measurements: Height: 5\' 11"  (180.3 cm) Weight: 155 lb (70.308 kg) IBW/kg (Calculated) : 75.3   Vital Signs: Temp: 99.8 F (37.7 C) (12/31 1131) Temp src: Oral (12/31 1131) BP: 147/79 mmHg (12/31 1446) Pulse Rate: 73  (12/31 1447)  Labs:  [facility] 04/19/11 1215 04/19/11 0829  HGB 10.8* 12.3*  HCT 30.5* 34.7*  PLT 141* 166  APTT -- --  LABPROT -- 13.6  INR -- 1.02  HEPARINUNFRC -- --  CREATININE 1.32 1.48*  CKTOTAL 649* --  CKMB 14.4* --  TROPONINI 2.91* 0.75*   Estimated Creatinine Clearance: 40.7 ml/min (by C-G formula based on Cr of 1.32).  Medical History: Past Medical History  Diagnosis Date  . ANEMIA-NOS 02/17/2007  . DEPRESSION 12/25/2008  . HYPERTENSION 02/17/2007  . GERD 02/17/2007  . OSTEOARTHRITIS 03/04/2010  . DIABETES MELLITUS, TYPE I, CONTROLLED, WITH RETINOPATHY   . Gastroparesis   . BENIGN PROSTATIC HYPERTROPHY   . VITAMIN B12 DEFICIENCY   . BACK PAIN, LUMBAR   . LIVER DISORDER   . PSA, INCREASED     Medications:  Scheduled:    . sodium chloride   Intravenous STAT  . aspirin  324 mg Oral Once  . insulin aspart  0-5 Units Subcutaneous QHS  . insulin aspart  0-9 Units Subcutaneous TID WC  . insulin NPH  18 Units Subcutaneous BID AC  . levofloxacin (LEVAQUIN) IV  750 mg Intravenous Q48H  . metoprolol tartrate  25 mg Oral BID  . moxifloxacin  400 mg Intravenous Once  . sodium chloride  500 mL Intravenous Once  . DISCONTD: levofloxacin (LEVAQUIN) IV  500 mg Intravenous Q24H  . DISCONTD: levofloxacin (LEVAQUIN) IV  750 mg Intravenous Q48H   Infusions:    . sodium chloride 125 mL/hr at 04/19/11 1303    Assessment: 77 yo M with NSTEMI Baseline INR normal  Goal of Therapy:  Heparin level 0.3-0.7 units/ml   Plan:  Baseline APTT Heparin 4000 unit bolus and 850 units/hr infusion Heparin level 8 hours after  start Follow daily heparin level and CBC  Gretta Arab PharmD  Pager [phone] 04/19/2011 3:18 PM

## 2011-04-19 NOTE — H&P (Addendum)
History and Physical  Eric [name] [id number] DOB: Jan 21, 1924 DOA: 04/19/2011  Referring physician: PCP: Eric Shin, MD, MD   Chief Complaint: Generalized weakness  HPI:  77 year old man brought to the emergency department by EMS with complaint of weakness and shakiness. Emergency department documentation reviewed but is incomplete currently. Patient had complained of dizziness, weakness and blurred vision. This occurred while he was driving from work. Per emergency room documentation: Examination was unremarkable. Impression was pneumonia, hyperglycemia, elevated creatinine and low sodium. He was referred for admission.  Troponin was noted to be elevated at 0.75. There is no CK-MB for comparison purposes. CT of the head was negative for acute process. Chest x-ray showed right lower lobe pneumonia.  Seen by Dr. Alain Marion December 28 for upper respiratory tract symptoms. At that time complained of sore throat, cough and generalized weakness. He was diagnosed with acute bronchitis and placed on Zithromax.  Review of Systems:  Positive for subjective fever, chills, sore throat, blurred vision (resolved), myalgias, mild shortness of breath  Negative for chest pain, rash, chest pain, bleeding, dysuria, abdominal pain, nausea, vomiting. Denies swallowing problems.  Past Medical History  Diagnosis Date  . ANEMIA-NOS 02/17/2007  . DEPRESSION 12/25/2008  . HYPERTENSION 02/17/2007  . GERD 02/17/2007  . OSTEOARTHRITIS 03/04/2010  . DIABETES MELLITUS, TYPE I, CONTROLLED, WITH RETINOPATHY   . Gastroparesis   . BENIGN PROSTATIC HYPERTROPHY   . VITAMIN B12 DEFICIENCY   . BACK PAIN, LUMBAR   . LIVER DISORDER   . PSA, INCREASED    Past Surgical History  Procedure Date  . Transurethral resection of prostate    Social History:  reports that he has never smoked. He does not have any smokeless tobacco history on file. He reports that he does not drink alcohol or use illicit drugs.  No Known  Allergies  Family History  Problem Relation Age of Onset  . Cancer Father     had uncertain type of cancer  . Cancer Brother     Prostate Cancer  . Cancer Brother     Prostate Cancer    Prior to Admission medications   Medication Sig Start Date End Date Taking? Authorizing Provider  acetaminophen (TYLENOL) 500 MG tablet Take 1,000 mg by mouth every 6 (six) hours as needed. For pain.    Yes Historical Provider, MD  aspirin EC 81 MG tablet Take 81 mg by mouth daily.     Yes Historical Provider, MD  atorvastatin (LIPITOR) 20 MG tablet Take 20 mg by mouth daily.     Yes Eric Shin, MD  azithromycin (ZITHROMAX) 250 MG tablet Take 250 mg by mouth daily. He is taking for 5 days. He started on 04/17/11 and has not completed.    Yes Eric Kehr, MD  carvedilol (COREG) 3.125 MG tablet Take 3.125 mg by mouth 2 (two) times daily with a meal.     Yes Historical Provider, MD  citalopram (CELEXA) 20 MG tablet Take 20 mg by mouth daily.     Yes Historical Provider, MD  clotrimazole (LOTRIMIN) 1 % cream Apply 1 application topically 2 (two) times daily. For irritation.   Yes Historical Provider, MD  furosemide (LASIX) 80 MG tablet Take 80 mg by mouth daily.     Yes Historical Provider, MD  insulin NPH (HUMULIN N) 100 UNIT/ML injection Inject 18 Units into the skin 2 (two) times daily before a meal. 04/07/11  Yes Eric Shin, MD  lactulose (CHRONULAC) 10 GM/15ML solution Take 15 ml by mouth twice  a day as needed    Yes Historical Provider, MD  lisinopril-hydrochlorothiazide (PRINZIDE,ZESTORETIC) 20-12.5 MG per tablet Take 2 tablets by mouth daily.    Yes Historical Provider, MD  losartan (COZAAR) 100 MG tablet Take 100 mg by mouth daily.     Yes Historical Provider, MD  promethazine-codeine (PHENERGAN WITH CODEINE) 6.25-10 MG/5ML syrup Take 5 mLs by mouth every 4 (four) hours as needed. For cough.    Yes Eric Kehr, MD  RABEprazole (ACIPHEX) 20 MG tablet Take 20 mg by mouth daily.     Yes [name], MD  aspirin 81 MG tablet Take 81 mg by mouth daily.      Historical Provider, MD  BAYER CONTOUR TEST test strip USE TWICE DAILY 03/31/11   Eric Shin, MD  Insulin Syringe-Needle U-100 31G X 5/16" 0.3 ML MISC Use as directed twice a day 11/27/10   Eric Shin, MD   Physical Exam:  Filed Vitals:   04/19/11 0717 04/19/11 0814 04/19/11 0815 04/19/11 0818  BP: 146/86 158/76 153/78 134/68  Pulse: 94 92  96  Temp: 99.3 F (37.4 C)     TempSrc: Oral     Resp: 14 13 12 22   Height: 5\' 11"  (1.803 m)     Weight: 70.308 kg (155 lb)     SpO2: 92% 94% 94% 95%     General:  Appears calm and comfortable appearing lying in a stretcher in the emergency department. Appears younger than stated age.  Eyes: Pupils equal, round and reactive to light. Normal lids, irises.  ENT: Grossly normal hearing. Lips and tongue appear unremarkable. Dentition in fair repair.  Neck: No lymphadenopathy or masses. No thyromegaly.  Cardiovascular: Regular rate and rhythm. No murmur, rub or gallop. No lower extremity edema.  Respiratory: Clear to auscultation bilaterally with the exception of the posterior right base which has rhonchi. No frank wheezes or rales. Normal respiratory effort.  Abdomen: Soft, nontender, nondistended.  Skin: Grossly unremarkable.  Musculoskeletal: Grossly normal tone upper and lower extremities.  Psychiatric: Grossly normal mood and affect.  Neurologic: Grossly normal cranial nerves.  Labs on Admission:  Basic Metabolic Panel:  Lab [id number]  NA 128*  K 3.7  CL 94*  CO2 23  GLUCOSE 311*  BUN 21  CREATININE 1.48*  CALCIUM 8.6  MG --  PHOS --   CBC:  Lab 04/19/11 0829  WBC 4.9  NEUTROABS 4.2  HGB 12.3*  HCT 34.7*  MCV 89.9  PLT 166   Cardiac Enzymes:  Lab 04/19/11 0829  CKTOTAL --  CKMB --  CKMBINDEX --  TROPONINI 0.75*   CBG:  Lab 04/19/11 0737  GLUCAP 343*     Radiological Exams on Admission: Dg Chest 2 View  04/19/2011   *RADIOLOGY REPORT*  Clinical Data: Generalized weakness.  Cough.  Shortness of breath. Chest congestion.  CHEST - 2 VIEW 04/19/2011:  Comparison: Two-view chest x-ray 12/25/2008, 06/28/2007, 04/18/2007, 10/04/2006 [place].  Findings: Patchy airspace opacities in the right lower lobe, new since the prior examinations.  Lungs remain clear otherwise. Cardiac silhouette upper normal in size for the AP technique, unchanged.  Thoracic aorta atherosclerotic, unchanged.  Hilar and mediastinal contours otherwise.  No pleural effusions. Degenerative changes involving the thoracic  IMPRESSION: Right lower lobe pneumonia.  Original Report Authenticated By: Deniece Portela, M.D.   Ct Head Wo Contrast  04/19/2011  *RADIOLOGY REPORT*  Clinical Data: Weakness.  Upper respiratory tract infection.  CT HEAD WITHOUT CONTRAST  Technique:  Contiguous  axial images were obtained from the base of the skull through the vertex without contrast.  Comparison: None.  Findings: The brain shows generalized atrophy.  There is mild chronic small vessel disease affecting the hemispheric deep white matter.  No sign of acute infarction, mass lesion, hemorrhage, hydrocephalus or extra-axial collection.  No skull fracture.  There is minimal mucosal inflammation of the paranasal sinuses.  IMPRESSION: No acute or reversible findings.  Age related atrophy and chronic small vessel disease.  Original Report Authenticated By: Jules Schick, M.D.    EKG: Independently reviewed. Normal sinus rhythm. No acute changes.  Assessment/Plan 1. Right lower lobe pneumonia: Continue empiric antibiotic therapy. Supplemental oxygen. Nebulizer treatments as needed. 2. Positive troponin: Significance unclear. EKG is reassuring. Patient has no symptoms to suggest acute coronary syndrome. Clinical suspicion is low. Continue serial cardiac enzymes. 3. Hyponatremia: Corrected sodium is 131. Possibly secondary to hydrochlorothiazide. Recheck in the  morning. Hold hydrochlorothiazide. 4. Visual disturbance: Symptomatically resolved. May be secondary to hyperglycemia. Followup eye exam as an outpatient. 5. Diabetes mellitus type 2: Uncontrolled by hemoglobin A1c 10.1 03/30/2011. Compliance has been an issue as an outpatient. Continue NPH. 6. Chronic kidney disease stage III: Appears to be stable.  Code Status: Full code.  Disposition Plan: Home when improved.  Addendum: Patient did rule in for a non-ST elevation MI. Cardiology is consulted. Patient was started on heparin infusion as well as beta blocker therapy. He is currently received aspirin.  Murray Hodgkins, MD  [facility] Pager [phone] 04/19/2011, 10:26 AM

## 2011-04-19 NOTE — ED Notes (Signed)
Paged md in ref order clarification for insulin, awaiting return call.

## 2011-04-19 NOTE — ED Provider Notes (Signed)
History     CSN: [id number]  Arrival date & time 04/19/11  0714   First MD Initiated Contact with Patient 04/19/11 0740      Chief Complaint  Patient presents with  . Weakness    (Consider location/radiation/quality/duration/timing/severity/associated sxs/prior treatment) Patient is a 77 y.o. male presenting with weakness. The history is provided by the patient.  Weakness The primary symptoms include dizziness. Primary symptoms do not include loss of consciousness. Primary symptoms comment: Blurred vision The symptoms began 1 to 2 hours ago. The symptoms are improving. The neurological symptoms are diffuse. Context: No known provocation.  Dizziness also occurs with weakness.  Additional symptoms include weakness.   The ptatient went to a job this morning, ate there, finished the job then drove to another job, but began to feel weak. He felt like the vision was blurred to the point he could not see the lines on the road. He is able to pull his vehicle off the side of the road to summon help. He presents per EMS.  Past Medical History  Diagnosis Date  . ANEMIA-NOS 02/17/2007  . DEPRESSION 12/25/2008  . HYPERTENSION 02/17/2007  . GERD 02/17/2007  . OSTEOARTHRITIS 03/04/2010  . DIABETES MELLITUS, TYPE I, CONTROLLED, WITH RETINOPATHY   . Gastroparesis   . BENIGN PROSTATIC HYPERTROPHY   . VITAMIN B12 DEFICIENCY   . BACK PAIN, LUMBAR   . LIVER DISORDER   . PSA, INCREASED     Past Surgical History  Procedure Date  . Transurethral resection of prostate     Family History  Problem Relation Age of Onset  . Cancer Father     had uncertain type of cancer  . Cancer Brother     Prostate Cancer  . Cancer Brother     Prostate Cancer    History  Substance Use Topics  . Smoking status: Never Smoker   . Smokeless tobacco: Not on file  . Alcohol Use: No      Review of Systems  HENT:       HEENT transient nosebleed 3 days ago. No persistent, sinus symptoms.   Cardiovascular:  Negative for chest pain, palpitations and leg swelling.  Neurological: Positive for dizziness and weakness. Negative for loss of consciousness.  All other systems reviewed and are negative.    Allergies  Review of patient's allergies indicates no known allergies.  Home Medications   No current outpatient prescriptions on file.  BP 113/59  Pulse 70  Temp(Src) 98.1 F (36.7 C) (Oral)  Resp 18  Ht 5\' 10"  (1.778 m)  Wt 155 lb 10.3 oz (70.6 kg)  BMI 22.33 kg/m2  SpO2 98%  Physical Exam  Nursing note and vitals reviewed. Constitutional: He is oriented to person, place, and time. He appears well-developed and well-nourished.  HENT:  Head: Normocephalic and atraumatic.  Right Ear: External ear normal.  Left Ear: External ear normal.       Right upper lip with small hemorrhagic blister, no associated swelling.  Eyes: Conjunctivae and EOM are normal. Pupils are equal, round, and reactive to light.  Neck: Normal range of motion and phonation normal. Neck supple.  Cardiovascular: Normal rate, regular rhythm, normal heart sounds and intact distal pulses.   Pulmonary/Chest: Effort normal and breath sounds normal. He exhibits no bony tenderness.  Abdominal: Soft. Normal appearance. There is no tenderness.  Musculoskeletal: Normal range of motion.  Neurological: He is alert and oriented to person, place, and time. He has normal strength. No cranial nerve deficit or  sensory deficit. He exhibits normal muscle tone. Coordination normal.  Skin: Skin is warm, dry and intact.  Psychiatric: He has a normal mood and affect. His behavior is normal. Judgment and thought content normal.    ED Course  Procedures (including critical care time)  Date: 04/19/2011  Rate: 94  Rhythm: normal sinus rhythm  QRS Axis: normal  Intervals: normal  ST/T Wave abnormalities: normal  Conduction Disutrbances:none  Narrative Interpretation: New right atrial enlargement noted  Old EKG Reviewed: changes  noted  Labs Reviewed  GLUCOSE, CAPILLARY - Abnormal; Notable for the following:    Glucose-Capillary 343 (*)    All other components within normal limits  CBC - Abnormal; Notable for the following:    RBC 3.86 (*)    Hemoglobin 12.3 (*)    HCT 34.7 (*)    All other components within normal limits  DIFFERENTIAL - Abnormal; Notable for the following:    Neutrophils Relative 86 (*)    Lymphocytes Relative 11 (*)    Lymphs Abs 0.6 (*)    All other components within normal limits  BASIC METABOLIC PANEL - Abnormal; Notable for the following:    Sodium 128 (*)    Chloride 94 (*)    Glucose, Bld 311 (*)    Creatinine, Ser 1.48 (*)    GFR calc non Af Amer 41 (*)    GFR calc Af Amer 48 (*)    All other components within normal limits  URINALYSIS, ROUTINE W REFLEX MICROSCOPIC - Abnormal; Notable for the following:    Glucose, UA >1000 (*)    Hgb urine dipstick MODERATE (*)    Ketones, ur TRACE (*)    Protein, ur 100 (*)    All other components within normal limits  TROPONIN I - Abnormal; Notable for the following:    Troponin I 0.75 (*)    All other components within normal limits  URINE MICROSCOPIC-ADD ON - Abnormal; Notable for the following:    Casts HYALINE CASTS (*) GRANULAR CAST   All other components within normal limits  CARDIAC PANEL(CRET KIN+CKTOT+MB+TROPI) - Abnormal; Notable for the following:    Total CK 649 (*)    CK, MB 14.4 (*)    Troponin I 2.91 (*)    All other components within normal limits  CARDIAC PANEL(CRET KIN+CKTOT+MB+TROPI) - Abnormal; Notable for the following:    Total CK 773 (*)    CK, MB 16.3 (*) CRITICAL VALUE NOTED.  VALUE IS CONSISTENT WITH PREVIOUSLY REPORTED AND CALLED VALUE.   Troponin I 3.27 (*)    All other components within normal limits  CARDIAC PANEL(CRET KIN+CKTOT+MB+TROPI) - Abnormal; Notable for the following:    Total CK 622 (*)    CK, MB 9.3 (*) CRITICAL VALUE NOTED.  VALUE IS CONSISTENT WITH PREVIOUSLY REPORTED AND CALLED VALUE.    Troponin I 1.87 (*)    All other components within normal limits  CBC - Abnormal; Notable for the following:    WBC 3.6 (*)    RBC 3.41 (*)    Hemoglobin 10.8 (*)    HCT 30.5 (*)    Platelets 141 (*)    All other components within normal limits  CREATININE, SERUM - Abnormal; Notable for the following:    GFR calc non Af Amer 47 (*)    GFR calc Af Amer 55 (*)    All other components within normal limits  GLUCOSE, CAPILLARY - Abnormal; Notable for the following:    Glucose-Capillary 272 (*)  All other components within normal limits  HEPARIN LEVEL (UNFRACTIONATED) - Abnormal; Notable for the following:    Heparin Unfractionated 0.25 (*)    All other components within normal limits  CBC - Abnormal; Notable for the following:    RBC 3.53 (*)    Hemoglobin 11.6 (*)    HCT 31.8 (*)    MCHC 36.5 (*)    All other components within normal limits  GLUCOSE, CAPILLARY - Abnormal; Notable for the following:    Glucose-Capillary 141 (*)    All other components within normal limits  BASIC METABOLIC PANEL - Abnormal; Notable for the following:    Sodium 130 (*)    Potassium 3.1 (*)    Glucose, Bld 234 (*)    Creatinine, Ser 1.36 (*)    Calcium 8.2 (*)    GFR calc non Af Amer 46 (*)    GFR calc Af Amer 53 (*)    All other components within normal limits  DIFFERENTIAL - Abnormal; Notable for the following:    Monocytes Relative 1 (*)    All other components within normal limits  GLUCOSE, CAPILLARY - Abnormal; Notable for the following:    Glucose-Capillary 177 (*)    All other components within normal limits  LIPID PANEL - Abnormal; Notable for the following:    HDL 38 (*)    All other components within normal limits  HEPARIN LEVEL (UNFRACTIONATED) - Abnormal; Notable for the following:    Heparin Unfractionated 0.24 (*)    All other components within normal limits  GLUCOSE, CAPILLARY - Abnormal; Notable for the following:    Glucose-Capillary 256 (*)    All other components within  normal limits  GLUCOSE, CAPILLARY - Abnormal; Notable for the following:    Glucose-Capillary 195 (*)    All other components within normal limits  GLUCOSE, CAPILLARY - Abnormal; Notable for the following:    Glucose-Capillary 215 (*)    All other components within normal limits  GLUCOSE, CAPILLARY - Abnormal; Notable for the following:    Glucose-Capillary 151 (*)    All other components within normal limits  URINE CULTURE  PROTIME-INR  TSH  APTT  POCT CBG MONITORING  CULTURE, SPUTUM-ASSESSMENT  GRAM STAIN  HEPARIN LEVEL (UNFRACTIONATED)  CBC  HEPARIN LEVEL (UNFRACTIONATED)  BASIC METABOLIC PANEL  MAGNESIUM   Dg Chest 2 View  04/19/2011  *RADIOLOGY REPORT*  Clinical Data: Generalized weakness.  Cough.  Shortness of breath. Chest congestion.  CHEST - 2 VIEW 04/19/2011:  Comparison: Two-view chest x-ray 12/25/2008, 06/28/2007, 04/18/2007, 10/04/2006 [place].  Findings: Patchy airspace opacities in the right lower lobe, new since the prior examinations.  Lungs remain clear otherwise. Cardiac silhouette upper normal in size for the AP technique, unchanged.  Thoracic aorta atherosclerotic, unchanged.  Hilar and mediastinal contours otherwise.  No pleural effusions. Degenerative changes involving the thoracic  IMPRESSION: Right lower lobe pneumonia.  Original Report Authenticated By: Deniece Portela, M.D.   Ct Head Wo Contrast  04/19/2011  *RADIOLOGY REPORT*  Clinical Data: Weakness.  Upper respiratory tract infection.  CT HEAD WITHOUT CONTRAST  Technique:  Contiguous axial images were obtained from the base of the skull through the vertex without contrast.  Comparison: None.  Findings: The brain shows generalized atrophy.  There is mild chronic small vessel disease affecting the hemispheric deep white matter.  No sign of acute infarction, mass lesion, hemorrhage, hydrocephalus or extra-axial collection.  No skull fracture.  There is minimal mucosal inflammation of the  paranasal  sinuses.  IMPRESSION: No acute or reversible findings.  Age related atrophy and chronic small vessel disease.  Original Report Authenticated By: Jules Schick, M.D.   Emergency  department treatment: IV fluids, bolus and drip. Avelox, IV for community-acquired pneumonia. On going vital signs, stable in emergency department.  10:26-troponin returned slightly elevated, a nonspecific finding in the absence of chest pain. EKG did not look acute. Patient was given a dose of aspirin.  Patient is alert, comfortable, repeat vital signs are stable.   1. Community acquired pneumonia   2. Hyperglycemia   3. Hyponatremia   4. Weakness   5. NSTEMI (non-ST elevated myocardial infarction)       MDM  Elderly patient with pneumonia and hyperglycemia. He is metabolically unstable with low sodium and elevated creatinine. He'll need to be admitted for stabilization. I doubt ACS.         Richarda Blade, MD 04/20/11 2000

## 2011-04-19 NOTE — ED Notes (Signed)
CBG 272. 

## 2011-04-20 ENCOUNTER — Other Ambulatory Visit: Payer: Self-pay

## 2011-04-20 LAB — CBC
HCT: 31.8 % — ABNORMAL LOW (ref 39.0–52.0)
MCH: 32.9 pg (ref 26.0–34.0)
MCHC: 36.5 g/dL — ABNORMAL HIGH (ref 30.0–36.0)
MCV: 90.1 fL (ref 78.0–100.0)
Platelets: 156 10*3/uL (ref 150–400)
RDW: 12.4 % (ref 11.5–15.5)

## 2011-04-20 LAB — CARDIAC PANEL(CRET KIN+CKTOT+MB+TROPI)
CK, MB: 9.3 ng/mL (ref 0.3–4.0)
Relative Index: 1.5 (ref 0.0–2.5)
Troponin I: 1.87 ng/mL (ref <0.30)

## 2011-04-20 LAB — DIFFERENTIAL
Basophils Absolute: 0 10*3/uL (ref 0.0–0.1)
Basophils Relative: 0 % (ref 0–1)
Lymphocytes Relative: 23 % (ref 12–46)
Monocytes Absolute: 0.1 10*3/uL (ref 0.1–1.0)
Neutro Abs: 4.1 10*3/uL (ref 1.7–7.7)
Neutrophils Relative %: 76 % (ref 43–77)

## 2011-04-20 LAB — GLUCOSE, CAPILLARY
Glucose-Capillary: 195 mg/dL — ABNORMAL HIGH (ref 70–99)
Glucose-Capillary: 215 mg/dL — ABNORMAL HIGH (ref 70–99)
Glucose-Capillary: 151 mg/dL — ABNORMAL HIGH (ref 70–99)

## 2011-04-20 LAB — URINE CULTURE

## 2011-04-20 LAB — HEPARIN LEVEL (UNFRACTIONATED): Heparin Unfractionated: 0.41 IU/mL (ref 0.30–0.70)

## 2011-04-20 LAB — LIPID PANEL
Cholesterol: 108 mg/dL (ref 0–200)
HDL: 38 mg/dL — ABNORMAL LOW (ref >39)
LDL Cholesterol: 61 mg/dL (ref 0–99)
Triglycerides: 43 mg/dL (ref <150)

## 2011-04-20 LAB — BASIC METABOLIC PANEL
Calcium: 8.2 mg/dL — ABNORMAL LOW (ref 8.4–10.5)
GFR calc Af Amer: 53 mL/min — ABNORMAL LOW (ref >90)
GFR calc non Af Amer: 46 mL/min — ABNORMAL LOW (ref >90)
Glucose, Bld: 234 mg/dL — ABNORMAL HIGH (ref 70–99)
Potassium: 3.1 mEq/L — ABNORMAL LOW (ref 3.5–5.1)
Sodium: 130 mEq/L — ABNORMAL LOW (ref 135–145)

## 2011-04-20 MED ORDER — HEPARIN SOD (PORCINE) IN D5W 100 UNIT/ML IV SOLN
1150.0000 [IU]/h | INTRAVENOUS | Status: DC
  Administered 2011-04-20: 1150 [IU]/h via INTRAVENOUS
  Filled 2011-04-20 (×3): qty 250

## 2011-04-20 MED ORDER — INSULIN NPH (HUMAN) (ISOPHANE) 100 UNIT/ML ~~LOC~~ SUSP
25.0000 [IU] | Freq: Two times a day (BID) | SUBCUTANEOUS | Status: DC
  Administered 2011-04-21 – 2011-04-24 (×5): 25 [IU] via SUBCUTANEOUS

## 2011-04-20 MED ORDER — CARVEDILOL 12.5 MG PO TABS
12.5000 mg | ORAL_TABLET | Freq: Two times a day (BID) | ORAL | Status: DC
  Administered 2011-04-20 – 2011-04-24 (×8): 12.5 mg via ORAL
  Filled 2011-04-20 (×12): qty 1

## 2011-04-20 MED ORDER — POTASSIUM CHLORIDE CRYS ER 20 MEQ PO TBCR
40.0000 meq | EXTENDED_RELEASE_TABLET | Freq: Three times a day (TID) | ORAL | Status: AC
  Administered 2011-04-20 – 2011-04-21 (×3): 40 meq via ORAL
  Filled 2011-04-20 (×4): qty 2

## 2011-04-20 MED ORDER — AMLODIPINE BESYLATE 5 MG PO TABS
5.0000 mg | ORAL_TABLET | Freq: Every day | ORAL | Status: DC
  Administered 2011-04-20 – 2011-04-23 (×4): 5 mg via ORAL
  Filled 2011-04-20 (×5): qty 1

## 2011-04-20 NOTE — Progress Notes (Signed)
TELEMETRY: Reviewed telemetry pt in NSR with PACs: Filed Vitals:   04/19/11 1629 04/19/11 1700 04/19/11 2143 04/20/11 0528  BP: 163/76 181/84 166/78 162/79  Pulse: 77 88 80 71  Temp:  100.5 F (38.1 C) 100 F (37.8 C) 98.4 F (36.9 C)  TempSrc:  Oral Oral Oral  Resp:  20 18 20   Height:  5\' 10"  (1.778 m)    Weight:  70.6 kg (155 lb 10.3 oz)    SpO2: 90% 94% 96% 94%    Intake/Output Summary (Last 24 hours) at 04/20/11 0936 Last data filed at 04/20/11 0500  Gross per 24 hour  Intake    720 ml  Output   1150 ml  Net   -430 ml    SUBJECTIVE Patient denies any chest pain or SOB. Positive cough. Still weak.  LABS: Basic Metabolic Panel:  Basename 04/20/11 0440 04/19/11 1215 04/19/11 0829  NA 130* -- 128*  K 3.1* -- 3.7  CL 99 -- 94*  CO2 22 -- 23  GLUCOSE 234* -- 311*  BUN 22 -- 21  CREATININE 1.36* 1.32 --  CALCIUM 8.2* -- 8.6  MG -- -- --  PHOS -- -- --   Liver Function Tests: No results found for this basename: AST:2,ALT:2,ALKPHOS:2,BILITOT:2,PROT:2,ALBUMIN:2 in the last 72 hours No results found for this basename: LIPASE:2,AMYLASE:2 in the last 72 hours CBC:  Basename 04/20/11 0440 04/19/11 1215 04/19/11 0829  WBC 5.4 3.6* --  NEUTROABS 4.1 -- 4.2  HGB 11.6* 10.8* --  HCT 31.8* 30.5* --  MCV 90.1 89.4 --  PLT 156 141* --   Cardiac Enzymes:  Basename 04/20/11 0025 04/19/11 1530 04/19/11 1215  CKTOTAL 622* 773* 649*  CKMB 9.3* 16.3* 14.4*  CKMBINDEX -- -- --  TROPONINI 1.87* 3.27* 2.91*   BNP: No components found with this basename: POCBNP:3 D-Dimer: No results found for this basename: DDIMER:2 in the last 72 hours Hemoglobin A1C: No results found for this basename: HGBA1C in the last 72 hours Fasting Lipid Panel:  Basename 04/20/11 0440  CHOL 108  HDL 38*  LDLCALC 61  TRIG 43  CHOLHDL 2.8  LDLDIRECT --   Thyroid Function Tests:  Basename 04/19/11 1215  TSH 1.514  T4TOTAL --  T3FREE --  THYROIDAB --   Anemia Panel: No results  found for this basename: VITAMINB12,FOLATE,FERRITIN,TIBC,IRON,RETICCTPCT in the last 72 hours  Radiology/Studies:  Dg Chest 2 View  04/19/2011  *RADIOLOGY REPORT*  Clinical Data: Generalized weakness.  Cough.  Shortness of breath. Chest congestion.  CHEST - 2 VIEW 04/19/2011:  Comparison: Two-view chest x-ray 12/25/2008, 06/28/2007, 04/18/2007, 10/04/2006 [place] [facility].  Findings: Patchy airspace opacities in the right lower lobe, new since the prior examinations.  Lungs remain clear otherwise. Cardiac silhouette upper normal in size for the AP technique, unchanged.  Thoracic aorta atherosclerotic, unchanged.  Hilar and mediastinal contours otherwise.  No pleural effusions. Degenerative changes involving the thoracic  IMPRESSION: Right lower lobe pneumonia.  Original Report Authenticated By: Deniece Portela, M.D.   Ct Head Wo Contrast  04/19/2011  *RADIOLOGY REPORT*  Clinical Data: Weakness.  Upper respiratory tract infection.  CT HEAD WITHOUT CONTRAST  Technique:  Contiguous axial images were obtained from the base of the skull through the vertex without contrast.  Comparison: None.  Findings: The brain shows generalized atrophy.  There is mild chronic small vessel disease affecting the hemispheric deep white matter.  No sign of acute infarction, mass lesion, hemorrhage, hydrocephalus or extra-axial collection.  No skull fracture.  There is minimal mucosal inflammation of the paranasal sinuses.  IMPRESSION: No acute or reversible findings.  Age related atrophy and chronic small vessel disease.  Original Report Authenticated By: Jules Schick, M.D.   ECG shows NSR without acute ST/T changes.  PHYSICAL EXAM General: Well developed, well nourished, in no acute distress. Head: Normocephalic, atraumatic, sclera non-icteric, no xanthomas, nares are without discharge. Neck: Negative for carotid bruits. JVD not elevated. Lungs: Bilateral coarse rhonchi R>L with right basilar rales. Breathing is  unlabored. Heart: RRR S1 S2 without murmurs, rubs, or gallops.  Abdomen: Soft, non-tender, non-distended with normoactive bowel sounds. No hepatomegaly. No rebound/guarding. No obvious abdominal masses. Msk:  Strength and tone appears normal for age. Extremities: No clubbing, cyanosis or edema.  Distal pedal pulses are 2+ and equal bilaterally. Neuro: Alert and oriented X 3. Moves all extremities spontaneously. Psych:  Responds to questions appropriately with a normal affect.  ASSESSMENT AND PLAN:  1. NSTEMI in setting of acute CAP. Ecg without acute change. Cardiac enzymes have peaked. Multiple risk factors including DM, HTN. Lipid status unknown. Will continue IV heparin. Increase beta blocker given HTN. ASA and statin Rx. Will check Echo. Anticipate Cardiac cath once PNA improved. Tomorrow probably too early. Will aim for Thursday.  2. CAP persistent low grade temp.  3. DM type 2  4. HTN poorly controlled. Not a good candidate for ARB/ ACEi acutely due to CKD. If remains elevated on Beta blocker would add amlodipine.  Principal Problem:  *NSTEMI (non-ST elevated myocardial infarction) Active Problems:  DIABETES MELLITUS, TYPE I, CONTROLLED, WITH RETINOPATHY  HYPERLIPIDEMIA  HYPERTENSION  GERD  CKD (chronic kidney disease) stage 3, GFR 30-59 ml/min    Signed, Marli Diego Martinique MD, [facility] 04/20/11 @ 9:45 am

## 2011-04-20 NOTE — Progress Notes (Signed)
PROGRESS NOTE  Eric [name] [id number] DOB: 1923-05-09 DOA: 04/19/2011 PCP: Renato Shin, MD, MD  Brief narrative: 78 year old man presented with generalized weakness. Was found to have pneumonia. Would then for non-ST elevation MI.  Past medical history: Diabetes  Consultants:  [place] [facility]ology  Procedures:  Left heart catheterization planned  Antibiotics:  December 31: Levaquin  Interim History: Interval documentation reviewed. Left heart catheterization was planned. Subjective: Feels fine, no chest pain.  Objective: Filed Vitals:   04/19/11 1700 04/19/11 2143 04/20/11 0528 04/20/11 1300  BP: 181/84 166/78 162/79 113/59  Pulse: 88 80 71 70  Temp: 100.5 F (38.1 C) 100 F (37.8 C) 98.4 F (36.9 C) 98.1 F (36.7 C)  TempSrc: Oral Oral Oral Oral  Resp: 20 18 20 18   Height: 5\' 10"  (1.778 m)     Weight: 70.6 kg (155 lb 10.3 oz)     SpO2: 94% 96% 94% 98%    Intake/Output Summary (Last 24 hours) at 04/20/11 1658 Last data filed at 04/20/11 1100  Gross per 24 hour  Intake   1200 ml  Output   1425 ml  Net   -225 ml    Exam:  General: Appears calm and comfortable. Cardiovascular: Regular rate and rhythm. No murmur, rub or gallop. No lower extremity edema. Respiratory: Clear to auscultation bilaterally. No wheezes, rales or rhonchi. Normal respiratory effort.  Data Reviewed: Basic Metabolic Panel:  Lab [id number] 0440 04/19/11 1215 04/19/11 0829  NA 130* -- 128*  K 3.1* -- 3.7  CL 99 -- 94*  CO2 22 -- 23  GLUCOSE 234* -- 311*  BUN 22 -- 21  CREATININE 1.36* 1.32 1.48*  CALCIUM 8.2* -- 8.6  MG -- -- --  PHOS -- -- --   CBC:  Lab 04/20/11 0440 04/19/11 1215 04/19/11 0829  WBC 5.4 3.6* 4.9  NEUTROABS 4.1 -- 4.2  HGB 11.6* 10.8* 12.3*  HCT 31.8* 30.5* 34.7*  MCV 90.1 89.4 89.9  PLT 156 141* 166   Cardiac Enzymes:  Lab 04/20/11 0025 04/19/11 1530 04/19/11 1215 04/19/11 0829  CKTOTAL 622* 773* 649* --  CKMB 9.3* 16.3* 14.4* --  CKMBINDEX  -- -- -- --  TROPONINI 1.87* 3.27* 2.91* 0.75*   CBG:  Lab 04/20/11 0750 04/19/11 2220 04/19/11 1825 04/19/11 1725 04/19/11 1216  GLUCAP 195* 256* 177* 141* 272*    Recent Results (from the past 240 hour(s))  URINE CULTURE     Status: Normal   Collection Time   04/19/11  8:42 AM      Component Value Range Status Comment   Specimen Description URINE, RANDOM   Final    Special Requests NONE   Final    Setup Time [id number]   Final    Colony Count NO GROWTH   Final    Culture NO GROWTH   Final    Report Status 04/20/2011 FINAL   Final      Studies: Dg Chest 2 View  04/19/2011  *RADIOLOGY REPORT*  Clinical Data: Generalized weakness.  Cough.  Shortness of breath. Chest congestion.  CHEST - 2 VIEW 04/19/2011:  Comparison: Two-view chest x-ray 12/25/2008, 06/28/2007, 04/18/2007, 10/04/2006 [place].  Findings: Patchy airspace opacities in the right lower lobe, new since the prior examinations.  Lungs remain clear otherwise. Cardiac silhouette upper normal in size for the AP technique, unchanged.  Thoracic aorta atherosclerotic, unchanged.  Hilar and mediastinal contours otherwise.  No pleural effusions. Degenerative changes involving the thoracic  IMPRESSION: Right lower lobe pneumonia.  Original Report Authenticated By: Deniece Portela, M.D.   Ct Head Wo Contrast  04/19/2011  *RADIOLOGY REPORT*  Clinical Data: Weakness.  Upper respiratory tract infection.  CT HEAD WITHOUT CONTRAST  Technique:  Contiguous axial images were obtained from the base of the skull through the vertex without contrast.  Comparison: None.  Findings: The brain shows generalized atrophy.  There is mild chronic small vessel disease affecting the hemispheric deep white matter.  No sign of acute infarction, mass lesion, hemorrhage, hydrocephalus or extra-axial collection.  No skull fracture.  There is minimal mucosal inflammation of the paranasal sinuses.  IMPRESSION: No acute or reversible findings.  Age  related atrophy and chronic small vessel disease.  Original Report Authenticated By: Jules Schick, M.D.    Scheduled Meds:   . amLODipine  5 mg Oral Daily  . aspirin EC  81 mg Oral Daily  . carvedilol  12.5 mg Oral BID WC  . citalopram  20 mg Oral Daily  . clotrimazole  1 application Topical BID  . influenza  inactive virus vaccine  0.5 mL Intramuscular Tomorrow-1000  . insulin aspart  0-5 Units Subcutaneous QHS  . insulin aspart  0-9 Units Subcutaneous TID WC  . insulin NPH  18 Units Subcutaneous BID AC  . levofloxacin (LEVAQUIN) IV  750 mg Intravenous Q48H  . losartan  100 mg Oral Daily  . pantoprazole  40 mg Oral Q1200  . pneumococcal 23 valent vaccine  0.5 mL Intramuscular Tomorrow-1000  . potassium chloride  40 mEq Oral TID  . rosuvastatin  20 mg Oral q1800  . sodium chloride  3 mL Intravenous Q12H  . DISCONTD: sodium chloride   Intravenous STAT  . DISCONTD: aspirin  81 mg Oral Daily  . DISCONTD: carvedilol  3.125 mg Oral BID WC  . DISCONTD: enoxaparin  40 mg Subcutaneous Q24H  . DISCONTD: furosemide  80 mg Oral Daily  . DISCONTD: metoprolol tartrate  25 mg Oral BID  . DISCONTD: simvastatin  20 mg Oral Daily   Continuous Infusions:   . heparin 1,150 Units/hr (04/20/11 1439)  . DISCONTD: sodium chloride 125 mL/hr at 04/19/11 1303  . DISCONTD: heparin 12 Units/kg/hr (04/19/11 1628)     Assessment/Plan: 1. Right lower lobe pneumonia: Community-acquired. Continue Levaquin. 2. Non-ST elevation MI: Continue beta blocker, statin, aspirin, aspirin. Further evaluation per cardiology. 3. Hyponatremia: Probably secondary to hydrochlorothiazide which has been discontinued. 4. Diabetes mellitus type 2: Uncontrolled by hemoglobin A1c. Increase long-acting insulin. 5. Chronic kidney disease stage III: Appears stable.  Code Status: Full code  Disposition Plan: Home when improved.   Murray Hodgkins, MD  [facility] Pager [phone] 04/20/2011, 4:58 PM    LOS:  1 day

## 2011-04-20 NOTE — Progress Notes (Signed)
ANTICOAGULATION CONSULT NOTE - Follow Up Consult  Pharmacy Consult for Heparin Indication: NSTEMI  No Known Allergies  Patient Measurements: Height: 5\' 10"  (177.8 cm) Weight: 155 lb 10.3 oz (70.6 kg) IBW/kg (Calculated) : 73    Vital Signs: Temp: 98.4 F (36.9 C) (01/01 0528) Temp src: Oral (01/01 0528) BP: 162/79 mmHg (01/01 0528) Pulse Rate: 71  (01/01 0528)  Labs:  Basename 04/20/11 1020 04/20/11 0440 04/20/11 0025 04/19/11 1600 04/19/11 1530 04/19/11 1215 04/19/11 0829  HGB -- 11.6* -- -- -- 10.8* --  HCT -- 31.8* -- -- -- 30.5* 34.7*  PLT -- 156 -- -- -- 141* 166  APTT -- -- -- 29 -- -- --  LABPROT -- -- -- -- -- -- 13.6  INR -- -- -- -- -- -- 1.02  HEPARINUNFRC 0.24* -- 0.25* -- -- -- --  CREATININE -- 1.36* -- -- -- 1.32 1.48*  CKTOTAL -- -- 622* -- 773* 649* --  CKMB -- -- 9.3* -- 16.3* 14.4* --  TROPONINI -- -- 1.87* -- 3.27* 2.91* --   Estimated Creatinine Clearance: 39.7 ml/min (by C-G formula based on Cr of 1.36).   Medications:  Scheduled:    . amLODipine  5 mg Oral Daily  . aspirin EC  81 mg Oral Daily  . carvedilol  12.5 mg Oral BID WC  . citalopram  20 mg Oral Daily  . clotrimazole  1 application Topical BID  . heparin  4,000 Units Intravenous Once  . influenza  inactive virus vaccine  0.5 mL Intramuscular Tomorrow-1000  . insulin aspart  0-5 Units Subcutaneous QHS  . insulin aspart  0-9 Units Subcutaneous TID WC  . insulin NPH  18 Units Subcutaneous BID AC  . levofloxacin (LEVAQUIN) IV  750 mg Intravenous Q48H  . losartan  100 mg Oral Daily  . pantoprazole  40 mg Oral Q1200  . pneumococcal 23 valent vaccine  0.5 mL Intramuscular Tomorrow-1000  . potassium chloride  40 mEq Oral TID  . rosuvastatin  20 mg Oral q1800  . sodium chloride  3 mL Intravenous Q12H  . DISCONTD: sodium chloride   Intravenous STAT  . DISCONTD: aspirin  81 mg Oral Daily  . DISCONTD: carvedilol  3.125 mg Oral BID WC  . DISCONTD: enoxaparin  40 mg Subcutaneous Q24H  .  DISCONTD: furosemide  80 mg Oral Daily  . DISCONTD: levofloxacin (LEVAQUIN) IV  750 mg Intravenous Q48H  . DISCONTD: metoprolol tartrate  25 mg Oral BID  . DISCONTD: simvastatin  20 mg Oral Daily   Infusions:    . heparin    . DISCONTD: sodium chloride 125 mL/hr at 04/19/11 1303  . DISCONTD: heparin 12 Units/kg/hr (04/19/11 1628)    Assessment: 78 yo male on IV heparin for NSTEMI.  Heparin level subtherapeutic (0.24) on 950 units/hr  Goal of Therapy:  Heparin level 0.3-0.7 units/ml   Plan:   Increase heparin 1150 units/hr  Check heparin level in 8hrs  Emiliano Dyer 04/20/2011,12:14 PM Pager: [phone]

## 2011-04-21 LAB — MAGNESIUM: Magnesium: 1.8 mg/dL (ref 1.5–2.5)

## 2011-04-21 LAB — GLUCOSE, CAPILLARY
Glucose-Capillary: 109 mg/dL — ABNORMAL HIGH (ref 70–99)
Glucose-Capillary: 132 mg/dL — ABNORMAL HIGH (ref 70–99)

## 2011-04-21 LAB — BASIC METABOLIC PANEL
CO2: 22 mEq/L (ref 19–32)
Calcium: 7.9 mg/dL — ABNORMAL LOW (ref 8.4–10.5)
Chloride: 102 mEq/L (ref 96–112)
Creatinine, Ser: 1.45 mg/dL — ABNORMAL HIGH (ref 0.50–1.35)
GFR calc Af Amer: 49 mL/min — ABNORMAL LOW (ref >90)
Sodium: 130 mEq/L — ABNORMAL LOW (ref 135–145)

## 2011-04-21 LAB — CBC
Hemoglobin: 10.3 g/dL — ABNORMAL LOW (ref 13.0–17.0)
MCH: 32 pg (ref 26.0–34.0)
Platelets: 148 10*3/uL — ABNORMAL LOW (ref 150–400)
RBC: 3.22 MIL/uL — ABNORMAL LOW (ref 4.22–5.81)

## 2011-04-21 LAB — IRON AND TIBC
Iron: 35 ug/dL — ABNORMAL LOW (ref 42–135)
Saturation Ratios: 25 % (ref 20–55)
TIBC: 139 ug/dL — ABNORMAL LOW (ref 215–435)
UIBC: 104 ug/dL — ABNORMAL LOW (ref 125–400)

## 2011-04-21 LAB — HEPARIN LEVEL (UNFRACTIONATED)
Heparin Unfractionated: 1.14 IU/mL — ABNORMAL HIGH (ref 0.30–0.70)
Heparin Unfractionated: 0.8 IU/mL — ABNORMAL HIGH (ref 0.30–0.70)

## 2011-04-21 LAB — FERRITIN: Ferritin: 622 ng/mL — ABNORMAL HIGH (ref 22–322)

## 2011-04-21 MED ORDER — HEPARIN SOD (PORCINE) IN D5W 100 UNIT/ML IV SOLN
700.0000 [IU]/h | INTRAVENOUS | Status: DC
  Administered 2011-04-21: 700 [IU]/h via INTRAVENOUS
  Filled 2011-04-21 (×2): qty 250

## 2011-04-21 NOTE — Progress Notes (Signed)
TELEMETRY: Reviewed telemetry pt in sinus brady rate 56: Filed Vitals:   04/20/11 0528 04/20/11 1300 04/20/11 2108 04/21/11 0525  BP: 162/79 113/59 105/63 109/62  Pulse: 71 70 67 57  Temp: 98.4 F (36.9 C) 98.1 F (36.7 C) 98.4 F (36.9 C) 98.5 F (36.9 C)  TempSrc: Oral Oral Oral Oral  Resp: 20 18 18 18   Height:      Weight:      SpO2: 94% 98% 98% 99%    Intake/Output Summary (Last 24 hours) at 04/21/11 0840 Last data filed at 04/21/11 0319  Gross per 24 hour  Intake   1200 ml  Output    800 ml  Net    400 ml    SUBJECTIVE Patient denies any chest pain or SOB. Positive cough. Still weak. No BM since admit.  LABS: Basic Metabolic Panel:  Basename 04/21/11 0500 04/20/11 0440  NA 130* 130*  K 3.9 3.1*  CL 102 99  CO2 22 22  GLUCOSE 85 234*  BUN 28* 22  CREATININE 1.45* 1.36*  CALCIUM 7.9* 8.2*  MG 1.8 --  PHOS -- --   Liver Function Tests: No results found for this basename: AST:2,ALT:2,ALKPHOS:2,BILITOT:2,PROT:2,ALBUMIN:2 in the last 72 hours No results found for this basename: LIPASE:2,AMYLASE:2 in the last 72 hours CBC:  Basename 04/21/11 0500 04/20/11 0440 04/19/11 0829  WBC 5.1 5.4 --  NEUTROABS -- 4.1 4.2  HGB 10.3* 11.6* --  HCT 29.0* 31.8* --  MCV 90.1 90.1 --  PLT 148* 156 --   Cardiac Enzymes:  Basename 04/20/11 0025 04/19/11 1530 04/19/11 1215  CKTOTAL 622* 773* 649*  CKMB 9.3* 16.3* 14.4*  CKMBINDEX -- -- --  TROPONINI 1.87* 3.27* 2.91*   BNP: No components found with this basename: POCBNP:3 D-Dimer: No results found for this basename: DDIMER:2 in the last 72 hours Hemoglobin A1C: No results found for this basename: HGBA1C in the last 72 hours Fasting Lipid Panel:  Basename 04/20/11 0440  CHOL 108  HDL 38*  LDLCALC 61  TRIG 43  CHOLHDL 2.8  LDLDIRECT --   Thyroid Function Tests:  Basename 04/19/11 1215  TSH 1.514  T4TOTAL --  T3FREE --  THYROIDAB --   Anemia Panel: No results found for this basename:  VITAMINB12,FOLATE,FERRITIN,TIBC,IRON,RETICCTPCT in the last 72 hours  Radiology/Studies:  Dg Chest 2 View  04/19/2011  *RADIOLOGY REPORT*  Clinical Data: Generalized weakness.  Cough.  Shortness of breath. Chest congestion.  CHEST - 2 VIEW 04/19/2011:  Comparison: Two-view chest x-ray 12/25/2008, 06/28/2007, 04/18/2007, 10/04/2006 [place].  Findings: Patchy airspace opacities in the right lower lobe, new since the prior examinations.  Lungs remain clear otherwise. Cardiac silhouette upper normal in size for the AP technique, unchanged.  Thoracic aorta atherosclerotic, unchanged.  Hilar and mediastinal contours otherwise.  No pleural effusions. Degenerative changes involving the thoracic  IMPRESSION: Right lower lobe pneumonia.  Original Report Authenticated By: Deniece Portela, M.D.   Ct Head Wo Contrast  04/19/2011  *RADIOLOGY REPORT*  Clinical Data: Weakness.  Upper respiratory tract infection.  CT HEAD WITHOUT CONTRAST  Technique:  Contiguous axial images were obtained from the base of the skull through the vertex without contrast.  Comparison: None.  Findings: The brain shows generalized atrophy.  There is mild chronic small vessel disease affecting the hemispheric deep white matter.  No sign of acute infarction, mass lesion, hemorrhage, hydrocephalus or extra-axial collection.  No skull fracture.  There is minimal mucosal inflammation of the paranasal sinuses.  IMPRESSION:  No acute or reversible findings.  Age related atrophy and chronic small vessel disease.  Original Report Authenticated By: Jules Schick, M.D.   ECG shows NSR without acute ST/T changes.  PHYSICAL EXAM General: Well developed, well nourished, in no acute distress. Head: Normocephalic, atraumatic, sclera non-icteric, no xanthomas, nares are without discharge. Neck: Negative for carotid bruits. JVD not elevated. Lungs: Bilateral  rhonchi R>L with right basilar rales. Breathing is unlabored. Heart: RRR S1 S2  without murmurs, rubs, or gallops.  Abdomen: Soft, non-tender, non-distended with normoactive bowel sounds. No hepatomegaly. No rebound/guarding. No obvious abdominal masses. Msk:  Strength and tone appears normal for age. Extremities: No clubbing, cyanosis or edema.  Distal pedal pulses are 2+ and equal bilaterally. Neuro: Alert and oriented X 3. Moves all extremities spontaneously. Psych:  Responds to questions appropriately with a normal affect.  ASSESSMENT AND PLAN:  1. NSTEMI in setting of acute CAP. Ecg without acute change. Cardiac enzymes have peaked. Multiple risk factors including DM, HTN. Will continue IV heparin. On BB, ASA and statin Rx. Will check Echo. Anticipate Cardiac cath once PNA improved. Tomorrow probably too early. Will aim for Friday.  2. CAP now afebrile  3. DM type 2  4. HTN now well controlled.  5. Acute on chronic renal insufficiency. Creatnine up. Lasix stopped. Will stop losartan as well. Recheck in am.  6. Anemia. Hbg has dropped 2 grams since admit. Will heme check stools. Check anemia panel.  Principal Problem:  *NSTEMI (non-ST elevated myocardial infarction) Active Problems:  DIABETES MELLITUS, TYPE I, CONTROLLED, WITH RETINOPATHY  HYPERLIPIDEMIA  HYPERTENSION  GERD  CKD (chronic kidney disease) stage 3, GFR 30-59 ml/min    Signed, Michaelann Gunnoe Martinique MD, [facility] 04/21/11 @ 8:44 am

## 2011-04-21 NOTE — Progress Notes (Signed)
UR complete 

## 2011-04-21 NOTE — Progress Notes (Signed)
Echocardiogram 2D Echocardiogram has been performed.  Eric [name] 04/21/2011, 3:47 PM

## 2011-04-21 NOTE — Progress Notes (Signed)
Eric [name] is a pleasant  78 y.o. male patient admitted with sob, found to have CAP, ruled in for NSTEMI. Cardiology input appreciated.  SUBJECTIVE Feels great, no complaints.   1. Community acquired pneumonia   2. Hyperglycemia   3. Hyponatremia   4. Weakness   5. NSTEMI (non-ST elevated myocardial infarction)     Past Medical History  Diagnosis Date  . ANEMIA-NOS 02/17/2007  . DEPRESSION 12/25/2008  . HYPERTENSION 02/17/2007  . GERD 02/17/2007  . OSTEOARTHRITIS 03/04/2010  . DIABETES MELLITUS, TYPE I, CONTROLLED, WITH RETINOPATHY   . Gastroparesis   . BENIGN PROSTATIC HYPERTROPHY   . VITAMIN B12 DEFICIENCY   . BACK PAIN, LUMBAR   . LIVER DISORDER   . PSA, INCREASED    Current Facility-Administered Medications  Medication Dose Route Frequency Provider Last Rate Last Dose  . 0.9 %  sodium chloride infusion  250 mL Intravenous PRN Eric [name]      . ipratropium (ATROVENT) nebulizer solution 0.5 mg  0.5 mg Nebulization Q6H PRN Eric [name]       And  . albuterol (PROVENTIL) (5 MG/ML) 0.5% nebulizer solution 2.5 mg  2.5 mg Nebulization Q6H PRN Eric [name]      . amLODipine (NORVASC) tablet 5 mg  5 mg Oral Daily Eric Martinique, MD   5 mg at 04/21/11 1005  . aspirin EC tablet 81 mg  81 mg Oral Daily Eric [name]   81 mg at 04/21/11 1005  . carvedilol (COREG) tablet 12.5 mg  12.5 mg Oral BID WC Eric Martinique, MD   12.5 mg at 04/21/11 1725  . citalopram (CELEXA) tablet 20 mg  20 mg Oral Daily Eric [name]   20 mg at 04/21/11 1005  . clotrimazole (LOTRIMIN) 1 % cream 1 application  1 application Topical BID Eric [name]   1 application at [id number] 1009  . heparin ADULT infusion 100 units/ml (25000 units/250 ml)  700 Units/hr Intravenous Continuous Eric [name] 8.5 mL/hr at 04/21/11 1006 850 Units/hr at 04/21/11 1006  . insulin aspart (novoLOG) injection 0-5 Units  0-5 Units Subcutaneous QHS Eric [name]   3 Units at 04/19/11  2301  . insulin aspart (novoLOG) injection 0-9 Units  0-9 Units Subcutaneous TID WC Eric [name]   1 Units at 04/21/11 1724  . insulin NPH (HUMULIN N,NOVOLIN N) injection 25 Units  25 Units Subcutaneous BID AC Eric [name]   25 Units at 04/21/11 1725  . lactulose (CHRONULAC) 10 GM/15ML solution 10 g  10 g Oral BID PRN Eric [name]      . Levofloxacin (LEVAQUIN) IVPB 750 mg  750 mg Intravenous Q48H Eric [name], PHARMD   750 mg at 04/21/11 1225  . nitroGLYCERIN (NITROSTAT) SL tablet 0.4 mg  0.4 mg Sublingual Q5 min PRN Eric [name]      . pantoprazole (PROTONIX) EC tablet 40 mg  40 mg Oral Q1200 Eric [name]   40 mg at 04/21/11 1225  . potassium chloride SA (K-DUR,KLOR-CON) CR tablet 40 mEq  40 mEq Oral TID Eric [name]   40 mEq at 04/21/11 0026  . rosuvastatin (CRESTOR) tablet 20 mg  20 mg Oral q1800 Eric [name]   20 mg at 04/21/11 1725  . sodium chloride 0.9 % injection 3 mL  3 mL Intravenous Q12H Eric [name]   3 mL at 04/19/11 2141  . sodium chloride 0.9 % injection 3 mL  3 mL Intravenous PRN Eric Quince  Marline [name]      . DISCONTD: heparin ADULT infusion 100 units/ml (25000 units/250 ml)  1,150 Units/hr Intravenous Continuous Eric [name] 11.5 mL/hr at 04/20/11 1439 1,150 Units/hr at 04/20/11 1439  . DISCONTD: losartan (COZAAR) tablet 100 mg  100 mg Oral Daily Eric [name]   100 mg at 04/20/11 1050   No Known Allergies Principal Problem:  *NSTEMI (non-ST elevated myocardial infarction) Active Problems:  DIABETES MELLITUS, TYPE I, CONTROLLED, WITH RETINOPATHY  HYPERLIPIDEMIA  HYPERTENSION  GERD  CKD (chronic kidney disease) stage 3, GFR 30-59 ml/min   Vital signs in last 24 hours: Temp:  [97.4 F (36.3 C)-98.5 F (36.9 C)] 97.4 F (36.3 C) (01/02 1300) Pulse Rate:  [57-67] 63  (01/02 1300) Resp:  [18] 18  (01/02 1300) BP: (105-126)/(62-73) 126/73 mmHg (01/02 1300) SpO2:  [97 %-99 %] 97 % (01/02  1300) Weight change:  Last BM Date: 04/18/11  Intake/Output from previous day: 01/01 0701 - 01/02 0700 In: 1200 [P.O.:1200] Out: 800 [Urine:800] Intake/Output this shift: Total I/O In: 728 [P.O.:540; I.V.:188] Out: 1175 [Urine:1175]  Lab Results:  Basename 04/21/11 0500 04/20/11 0440  WBC 5.1 5.4  HGB 10.3* 11.6*  HCT 29.0* 31.8*  PLT 148* 156   BMET  Basename 04/21/11 0500 04/20/11 0440  NA 130* 130*  K 3.9 3.1*  CL 102 99  CO2 22 22  GLUCOSE 85 234*  BUN 28* 22  CREATININE 1.45* 1.36*  CALCIUM 7.9* 8.2*    Studies/Results: No results found.  Medications: I have reviewed the patient's current medications.   Physical exam GENERAL- alert HEAD- normal atraumatic, no neck masses, normal thyroid, no jvd RESPIRATORY- appears well, vitals normal, no respiratory distress, acyanotic, normal RR, ear and throat exam is normal, neck free of mass or lymphadenopathy, chest clear, no wheezing, crepitations, rhonchi, normal symmetric air entry CVS- regular rate and rhythm, S1, S2 normal, no murmur, click, rub or gallop ABDOMEN- abdomen is soft without significant tenderness, masses, organomegaly or guarding NEURO- Grossly normal EXTREMITIES- extremities normal, atraumatic, no cyanosis or edema  Plan   *NSTEMI (non-ST elevated myocardial infarction)- stable. Defer management to cardiology. Eventually cardiac cath.  *CAP- improving, afebrile. Continue abx to complete at least 7 days.  *chronic medical conditions as below stable.  DIABETES MELLITUS, TYPE I, CONTROLLED, WITH RETINOPATHY  HYPERLIPIDEMIA  HYPERTENSION  GERD  CKD (chronic kidney disease) stage 3, GFR 30-59 ml/min    Eric [name] 04/21/2011 6:02 PM Pager: [phone].

## 2011-04-21 NOTE — Progress Notes (Signed)
ANTICOAGULATION CONSULT NOTE - Follow Up Consult  Pharmacy Consult for Heparin Indication: chest pain/ACS  No Known Allergies  Patient Measurements: Height: 5\' 10"  (177.8 cm) Weight: 155 lb 10.3 oz (70.6 kg) IBW/kg (Calculated) : 73   Vital Signs: Temp: 97.4 F (36.3 C) (01/02 1300) Temp src: Oral (01/02 1300) BP: 126/73 mmHg (01/02 1300) Pulse Rate: 63  (01/02 1300)  Labs:  Basename 04/21/11 1420 04/21/11 0500 04/20/11 2214 04/20/11 0440 04/20/11 0025 04/19/11 1600 04/19/11 1530 04/19/11 1215 04/19/11 0829  HGB -- 10.3* -- 11.6* -- -- -- -- --  HCT -- 29.0* -- 31.8* -- -- -- 30.5* --  PLT -- 148* -- 156 -- -- -- 141* --  APTT -- -- -- -- -- 29 -- -- --  LABPROT -- -- -- -- -- -- -- -- 13.6  INR -- -- -- -- -- -- -- -- 1.02  HEPARINUNFRC 0.80* 1.14* 0.41 -- -- -- -- -- --  CREATININE -- 1.45* -- 1.36* -- -- -- 1.32 --  CKTOTAL -- -- -- -- 622* -- 773* 649* --  CKMB -- -- -- -- 9.3* -- 16.3* 14.4* --  TROPONINI -- -- -- -- 1.87* -- 3.27* 2.91* --   Estimated Creatinine Clearance: 37.2 ml/min (by C-G formula based on Cr of 1.45).   Medications:  Scheduled:     . amLODipine  5 mg Oral Daily  . aspirin EC  81 mg Oral Daily  . carvedilol  12.5 mg Oral BID WC  . citalopram  20 mg Oral Daily  . clotrimazole  1 application Topical BID  . insulin aspart  0-5 Units Subcutaneous QHS  . insulin aspart  0-9 Units Subcutaneous TID WC  . insulin NPH  25 Units Subcutaneous BID AC  . levofloxacin (LEVAQUIN) IV  750 mg Intravenous Q48H  . pantoprazole  40 mg Oral Q1200  . potassium chloride  40 mEq Oral TID  . rosuvastatin  20 mg Oral q1800  . sodium chloride  3 mL Intravenous Q12H  . DISCONTD: insulin NPH  18 Units Subcutaneous BID AC  . DISCONTD: losartan  100 mg Oral Daily   Infusions:  Heparin 850 units/hr  Assessment: Heparin level remains slightly supratherapeutic 0.8 but decreasing after holding for 1 hour and decreasing rate to 850 units/hr.   Goal of Therapy:    Heparin level 0.3-0.7 units/ml   Plan:  1) Decrease rate further to 700 units/hr 2) Recheck level in 6hrs   Peggyann Juba R 04/21/2011,3:29 PM Pager: [phone]

## 2011-04-21 NOTE — Progress Notes (Signed)
ANTICOAGULATION CONSULT NOTE - Follow Up Consult  Pharmacy Consult for Heparin Indication: chest pain/ACS  No Known Allergies  Patient Measurements: Height: 5\' 10"  (177.8 cm) Weight: 155 lb 10.3 oz (70.6 kg) IBW/kg (Calculated) : 73   Vital Signs: Temp: 98.5 F (36.9 C) (01/02 0525) Temp src: Oral (01/02 0525) BP: 109/62 mmHg (01/02 0525) Pulse Rate: 57  (01/02 0525)  Labs:  Basename 04/21/11 0500 04/20/11 2214 04/20/11 1020 04/20/11 0440 04/20/11 0025 04/19/11 1600 04/19/11 1530 04/19/11 1215 04/19/11 0829  HGB 10.3* -- -- 11.6* -- -- -- -- --  HCT 29.0* -- -- 31.8* -- -- -- 30.5* --  PLT 148* -- -- 156 -- -- -- 141* --  APTT -- -- -- -- -- 29 -- -- --  LABPROT -- -- -- -- -- -- -- -- 13.6  INR -- -- -- -- -- -- -- -- 1.02  HEPARINUNFRC 1.14* 0.41 0.24* -- -- -- -- -- --  CREATININE 1.45* -- -- 1.36* -- -- -- 1.32 --  CKTOTAL -- -- -- -- 622* -- 773* 649* --  CKMB -- -- -- -- 9.3* -- 16.3* 14.4* --  TROPONINI -- -- -- -- 1.87* -- 3.27* 2.91* --   Estimated Creatinine Clearance: 37.2 ml/min (by C-G formula based on Cr of 1.45).   Medications:  Scheduled:    . amLODipine  5 mg Oral Daily  . aspirin EC  81 mg Oral Daily  . carvedilol  12.5 mg Oral BID WC  . citalopram  20 mg Oral Daily  . clotrimazole  1 application Topical BID  . influenza  inactive virus vaccine  0.5 mL Intramuscular Tomorrow-1000  . insulin aspart  0-5 Units Subcutaneous QHS  . insulin aspart  0-9 Units Subcutaneous TID WC  . insulin NPH  25 Units Subcutaneous BID AC  . levofloxacin (LEVAQUIN) IV  750 mg Intravenous Q48H  . losartan  100 mg Oral Daily  . pantoprazole  40 mg Oral Q1200  . pneumococcal 23 valent vaccine  0.5 mL Intramuscular Tomorrow-1000  . potassium chloride  40 mEq Oral TID  . rosuvastatin  20 mg Oral q1800  . sodium chloride  3 mL Intravenous Q12H  . DISCONTD: carvedilol  3.125 mg Oral BID WC  . DISCONTD: furosemide  80 mg Oral Daily  . DISCONTD: insulin NPH  18 Units  Subcutaneous BID AC   Infusions:    . DISCONTD: heparin 1,150 Units/hr (04/20/11 1439)   PRN: sodium chloride, albuterol, ipratropium, lactulose, nitroGLYCERIN, sodium chloride  Assessment: Heparin level supratherapeutic at 1.14 this am. Spoke with incoming RN who reported no problems and confirmed that heparin continues to run at 1150 units/hr (11.5 ml/hr). Will hold heparin drip for 1 hour, then resume at lower rate.  Goal of Therapy:  Heparin level 0.3-0.7 units/ml   Plan:  1) Hold heparin starting now x1 hour. 2) At 8:30am, restart heparin at 850 units/hr (8.59ml/hr) 3) Heparin level in 6 hours (14:30)  Samul Dada 04/21/2011,7:19 AM

## 2011-04-21 NOTE — Progress Notes (Signed)
ANTICOAGULATION CONSULT NOTE - Follow Up Consult  Pharmacy Consult for Heparin Indication: chest pain/ACS  No Known Allergies  Patient Measurements: Height: 5\' 10"  (177.8 cm) Weight: 155 lb 10.3 oz (70.6 kg) IBW/kg (Calculated) : 73  Adjusted Body Weight:   Vital Signs: Temp: 98.4 F (36.9 C) (01/01 2108) Temp src: Oral (01/01 2108) BP: 105/63 mmHg (01/01 2108) Pulse Rate: 67  (01/01 2108)  Labs:  Basename 04/20/11 2214 04/20/11 1020 04/20/11 0440 04/20/11 0025 04/19/11 1600 04/19/11 1530 04/19/11 1215 04/19/11 0829  HGB -- -- 11.6* -- -- -- 10.8* --  HCT -- -- 31.8* -- -- -- 30.5* 34.7*  PLT -- -- 156 -- -- -- 141* 166  APTT -- -- -- -- 29 -- -- --  LABPROT -- -- -- -- -- -- -- 13.6  INR -- -- -- -- -- -- -- 1.02  HEPARINUNFRC 0.41 0.24* -- 0.25* -- -- -- --  CREATININE -- -- 1.36* -- -- -- 1.32 1.48*  CKTOTAL -- -- -- 622* -- 773* 649* --  CKMB -- -- -- 9.3* -- 16.3* 14.4* --  TROPONINI -- -- -- 1.87* -- 3.27* 2.91* --   Estimated Creatinine Clearance: 39.7 ml/min (by C-G formula based on Cr of 1.36).   Medications:  Infusions:    . heparin 1,150 Units/hr (04/20/11 1439)    Assessment: Patient with heparin level at goal and no issues/bleeding noted by RN  Goal of Therapy:  Heparin level 0.3-0.7 units/ml   Plan:  Continue with current rate, f/u with am labs  Tyler Deis, Shea Stakes Crowford 04/21/2011,3:56 AM

## 2011-04-22 DIAGNOSIS — I214 Non-ST elevation (NSTEMI) myocardial infarction: Secondary | ICD-10-CM

## 2011-04-22 LAB — HEPARIN LEVEL (UNFRACTIONATED): Heparin Unfractionated: 0.42 IU/mL (ref 0.30–0.70)

## 2011-04-22 LAB — CBC
HCT: 31.4 % — ABNORMAL LOW (ref 39.0–52.0)
Hemoglobin: 11 g/dL — ABNORMAL LOW (ref 13.0–17.0)
MCH: 31.8 pg (ref 26.0–34.0)
RBC: 3.46 MIL/uL — ABNORMAL LOW (ref 4.22–5.81)

## 2011-04-22 LAB — COMPREHENSIVE METABOLIC PANEL
ALT: 29 U/L (ref 0–53)
AST: 40 U/L — ABNORMAL HIGH (ref 0–37)
CO2: 19 mEq/L (ref 19–32)
Chloride: 103 mEq/L (ref 96–112)
Creatinine, Ser: 1.31 mg/dL (ref 0.50–1.35)
GFR calc non Af Amer: 48 mL/min — ABNORMAL LOW (ref >90)
Total Bilirubin: 0.3 mg/dL (ref 0.3–1.2)

## 2011-04-22 LAB — GLUCOSE, CAPILLARY
Glucose-Capillary: 170 mg/dL — ABNORMAL HIGH (ref 70–99)
Glucose-Capillary: 174 mg/dL — ABNORMAL HIGH (ref 70–99)
Glucose-Capillary: 160 mg/dL — ABNORMAL HIGH (ref 70–99)

## 2011-04-22 MED ORDER — SODIUM CHLORIDE 0.9 % IV SOLN: 250.0000 mL | INTRAVENOUS | Status: DC | PRN

## 2011-04-22 MED ORDER — GLUCOSE 40 % PO GEL
ORAL | Status: AC
  Administered 2011-04-22: 37.5 g via ORAL
  Filled 2011-04-22: qty 1

## 2011-04-22 MED ORDER — GLUCOSE 40 % PO GEL
1.0000 | ORAL | Status: DC | PRN
  Administered 2011-04-22: 37.5 g via ORAL

## 2011-04-22 MED ORDER — ASPIRIN 81 MG PO CHEW
324.0000 mg | CHEWABLE_TABLET | ORAL | Status: AC
  Administered 2011-04-23: 324 mg via ORAL
  Filled 2011-04-22: qty 4

## 2011-04-22 MED ORDER — DIAZEPAM 5 MG PO TABS
5.0000 mg | ORAL_TABLET | ORAL | Status: AC
  Administered 2011-04-23: 5 mg via ORAL
  Filled 2011-04-22: qty 1

## 2011-04-22 MED ORDER — SODIUM CHLORIDE 0.9 % IV SOLN
1.0000 mL/kg/h | INTRAVENOUS | Status: DC
  Administered 2011-04-22 – 2011-04-23 (×2): 1 mL/kg/h via INTRAVENOUS

## 2011-04-22 MED ORDER — HEPARIN SOD (PORCINE) IN D5W 100 UNIT/ML IV SOLN
800.0000 [IU]/h | INTRAVENOUS | Status: DC
  Administered 2011-04-23: 800 [IU]/h via INTRAVENOUS
  Filled 2011-04-22 (×5): qty 250

## 2011-04-22 MED ORDER — SODIUM CHLORIDE 0.9 % IJ SOLN: 3.0000 mL | INTRAMUSCULAR | Status: DC | PRN

## 2011-04-22 MED ORDER — SODIUM CHLORIDE 0.9 % IJ SOLN
3.0000 mL | Freq: Two times a day (BID) | INTRAMUSCULAR | Status: DC
  Administered 2011-04-22: 3 mL via INTRAVENOUS

## 2011-04-22 NOTE — Progress Notes (Signed)
Cardiology appreciated. Patient rather subdued today. Feels ok, denies chest pain.  O/E BP 126/73 HR 56-73. R-18 O2sat 97%RA.  HEENT-no jvd RS-lungs clear CVS-S1S2.RRR. No murmurs. Abdomen- benign CNS- grossly intact Extremities - no pedal edema.  Labs Bun/cr-26/1.31 wbc 4.7 hb 11. 2decho results noted.  Plan  *NSTEMI (non-ST elevated myocardial infarction)- stable. For cardiac cath in am.  *CAP- improving, afebrile. Continue abx to complete at least 7 days.  *chronic medical conditions as below stable.  DIABETES MELLITUS, TYPE I, CONTROLLED, WITH RETINOPATHY  HYPERLIPIDEMIA  HYPERTENSION  GERD  CKD (chronic kidney disease) stage 3, GFR 30-59 ml/min- agree with rehydration.

## 2011-04-22 NOTE — Progress Notes (Signed)
CBG: 62 at 0200  Treatment: 1 tube instant glucose and 15 GM carbohydrate snack  Symptoms: Shaky  Follow-up CBG: Time:0230 CBG Result:104  Possible Reasons for Event: Unknown  Comments/MD notified: Pt's bedtime CBG was 81. No hs insulin given and patient at 100% of his bedtime snack (peanut butter crackers, fruit cup, diet ginger ale). Pt called out around 0200 asking for his blood sugar to be taken.    Pheobe Sandiford, Julieta Gutting RN

## 2011-04-22 NOTE — Progress Notes (Signed)
ANTICOAGULATION CONSULT NOTE - Follow Up Consult  Pharmacy Consult for Heparin Indication: chest pain/ACS  No Known Allergies  Patient Measurements: Height: 5\' 10"  (177.8 cm) Weight: 155 lb 10.3 oz (70.6 kg) IBW/kg (Calculated) : 73    Vital Signs: Temp: 97.5 F (36.4 C) (01/02 2113) Temp src: Oral (01/02 2113) BP: 138/71 mmHg (01/02 2113) Pulse Rate: 56  (01/02 2113)  Labs:  Basename 04/21/11 2330 04/21/11 1420 04/21/11 0500 04/20/11 0440 04/20/11 0025 04/19/11 1600 04/19/11 1530 04/19/11 1215 04/19/11 0829  HGB -- -- 10.3* 11.6* -- -- -- -- --  HCT -- -- 29.0* 31.8* -- -- -- 30.5* --  PLT -- -- 148* 156 -- -- -- 141* --  APTT -- -- -- -- -- 29 -- -- --  LABPROT -- -- -- -- -- -- -- -- 13.6  INR -- -- -- -- -- -- -- -- 1.02  HEPARINUNFRC 0.28* 0.80* 1.14* -- -- -- -- -- --  CREATININE -- -- 1.45* 1.36* -- -- -- 1.32 --  CKTOTAL -- -- -- -- 622* -- 773* 649* --  CKMB -- -- -- -- 9.3* -- 16.3* 14.4* --  TROPONINI -- -- -- -- 1.87* -- 3.27* 2.91* --   Estimated Creatinine Clearance: 37.2 ml/min (by C-G formula based on Cr of 1.45).   Medications:  Scheduled:    . amLODipine  5 mg Oral Daily  . aspirin EC  81 mg Oral Daily  . carvedilol  12.5 mg Oral BID WC  . citalopram  20 mg Oral Daily  . clotrimazole  1 application Topical BID  . insulin aspart  0-5 Units Subcutaneous QHS  . insulin aspart  0-9 Units Subcutaneous TID WC  . insulin NPH  25 Units Subcutaneous BID AC  . levofloxacin (LEVAQUIN) IV  750 mg Intravenous Q48H  . pantoprazole  40 mg Oral Q1200  . rosuvastatin  20 mg Oral q1800  . sodium chloride  3 mL Intravenous Q12H  . DISCONTD: losartan  100 mg Oral Daily   Infusions:    . heparin 700 Units/hr (04/21/11 2046)  . DISCONTD: heparin 1,150 Units/hr (04/20/11 1439)    Assessment: HL low after decreasing rate to 700 units/hr. No bleeding/IV interuptions per RN. Goal of Therapy:  Heparin level 0.3-0.7 units/ml   Plan:  Increase Heparin to 800  units/hr. Recheck level @ 0900 today.  Lawana Pai R 04/22/2011,12:50 AM

## 2011-04-22 NOTE — Progress Notes (Signed)
TELEMETRY: Reviewed telemetry pt in sinus brady rate 56: Filed Vitals:   04/20/11 2108 04/21/11 0525 04/21/11 1300 04/21/11 2113  BP: 105/63 109/62 126/73 138/71  Pulse: 67 57 63 56  Temp: 98.4 F (36.9 C) 98.5 F (36.9 C) 97.4 F (36.3 C) 97.5 F (36.4 C)  TempSrc: Oral Oral Oral Oral  Resp: 18 18 18 18   Height:      Weight:      SpO2: 98% 99% 97% 97%    Intake/Output Summary (Last 24 hours) at 04/22/11 0812 Last data filed at 04/22/11 0200  Gross per 24 hour  Intake 1321.15 ml  Output   1625 ml  Net -303.85 ml    SUBJECTIVE Patient denies any chest pain or SOB. Complains of a sore throat.  LABS: Basic Metabolic Panel:  Basename 04/22/11 0417 04/21/11 0500  NA 133* 130*  K 3.9 3.9  CL 103 102  CO2 19 22  GLUCOSE 99 85  BUN 26* 28*  CREATININE 1.31 1.45*  CALCIUM 8.2* 7.9*  MG -- 1.8  PHOS -- --   Liver Function Tests:  [facility] 04/22/11 0417  AST 40*  ALT 29  ALKPHOS 80  BILITOT 0.3  PROT 6.1  ALBUMIN 2.2*   No results found for this basename: LIPASE:2,AMYLASE:2 in the last 72 hours CBC:  Basename 04/22/11 0417 04/21/11 0500 04/20/11 0440 04/19/11 0829  WBC 4.7 5.1 -- --  NEUTROABS -- -- 4.1 4.2  HGB 11.0* 10.3* -- --  HCT 31.4* 29.0* -- --  MCV 90.8 90.1 -- --  PLT 179 148* -- --   Cardiac Enzymes:  Basename 04/20/11 0025 04/19/11 1530 04/19/11 1215  CKTOTAL 622* 773* 649*  CKMB 9.3* 16.3* 14.4*  CKMBINDEX -- -- --  TROPONINI 1.87* 3.27* 2.91*   BNP: No components found with this basename: POCBNP:3 D-Dimer: No results found for this basename: DDIMER:2 in the last 72 hours Hemoglobin A1C: No results found for this basename: HGBA1C in the last 72 hours Fasting Lipid Panel:  Basename 04/20/11 0440  CHOL 108  HDL 38*  LDLCALC 61  TRIG 43  CHOLHDL 2.8  LDLDIRECT --   Thyroid Function Tests:  Basename 04/19/11 1215  TSH 1.514  T4TOTAL --  T3FREE --  THYROIDAB --   Anemia Panel:  Basename 04/21/11 0500  VITAMINB12 709    FOLATE >20.0  FERRITIN 622*  TIBC 139*  IRON 35*  RETICCTPCT 0.5    Radiology/Studies:  Dg Chest 2 View  04/19/2011  *RADIOLOGY REPORT*  Clinical Data: Generalized weakness.  Cough.  Shortness of breath. Chest congestion.  CHEST - 2 VIEW 04/19/2011:  Comparison: Two-view chest x-ray 12/25/2008, 06/28/2007, 04/18/2007, 10/04/2006 [place].  Findings: Patchy airspace opacities in the right lower lobe, new since the prior examinations.  Lungs remain clear otherwise. Cardiac silhouette upper normal in size for the AP technique, unchanged.  Thoracic aorta atherosclerotic, unchanged.  Hilar and mediastinal contours otherwise.  No pleural effusions. Degenerative changes involving the thoracic  IMPRESSION: Right lower lobe pneumonia.  Original Report Authenticated By: Deniece Portela, M.D.   Ct Head Wo Contrast  04/19/2011  *RADIOLOGY REPORT*  Clinical Data: Weakness.  Upper respiratory tract infection.  CT HEAD WITHOUT CONTRAST  Technique:  Contiguous axial images were obtained from the base of the skull through the vertex without contrast.  Comparison: None.  Findings: The brain shows generalized atrophy.  There is mild chronic small vessel disease affecting the hemispheric deep white matter.  No sign of acute  infarction, mass lesion, hemorrhage, hydrocephalus or extra-axial collection.  No skull fracture.  There is minimal mucosal inflammation of the paranasal sinuses.  IMPRESSION: No acute or reversible findings.  Age related atrophy and chronic small vessel disease.  Original Report Authenticated By: Jules Schick, M.D.   ECG shows NSR without acute ST/T changes.  PHYSICAL EXAM General: Well developed, well nourished, in no acute distress. Head: Normocephalic, atraumatic, sclera non-icteric, no xanthomas, nares are without discharge. Neck: Negative for carotid bruits. JVD not elevated. Lungs: Bilateral  rhonchi R>L. Breathing is unlabored. Heart: RRR S1 S2 with grade 2/6 apical  murmur Abdomen: Soft, non-tender, non-distended with normoactive bowel sounds. No hepatomegaly. No rebound/guarding. No obvious abdominal masses. Msk:  Strength and tone appears normal for age. Extremities: No clubbing, cyanosis or edema.  Distal pedal pulses are 2+ and equal bilaterally. Neuro: Alert and oriented X 3. Moves all extremities spontaneously. Psych:  Responds to questions appropriately with a normal affect.  ASSESSMENT AND PLAN:  1. NSTEMI in setting of acute CAP. Ecg without acute change. Cardiac enzymes have peaked. Multiple risk factors including DM, HTN. Will continue IV heparin. On BB, ASA and statin Rx. Echo shows normal systolic function. Anticipate Cardiac cath planned for tomorrow. Will hydrate tonight.  2. CAP now afebrile, improving  3. DM type 2  4. HTN now well controlled.  5. Acute on chronic renal insufficiency. Creatnine improved off lasix and ARB.  6. Anemia. Hbg up to 11 today. Iron and Tibc low. Folate and B12 normal.  Principal Problem:  *NSTEMI (non-ST elevated myocardial infarction) Active Problems:  DIABETES MELLITUS, TYPE I, CONTROLLED, WITH RETINOPATHY  HYPERLIPIDEMIA  HYPERTENSION  GERD  CKD (chronic kidney disease) stage 3, GFR 30-59 ml/min    Signed, Peter Martinique MD, [facility] 04/22/11 @ 8:15 am

## 2011-04-22 NOTE — Progress Notes (Signed)
ANTICOAGULATION CONSULT NOTE - Follow Up Consult  Pharmacy Consult for IV Heparin Indication: NSTEMI  No Known Allergies  Patient Measurements: Height: 5\' 10"  (177.8 cm) Weight: 155 lb 10.3 oz (70.6 kg) IBW/kg (Calculated) : 73   Vital Signs: Temp: 98.2 F (36.8 C) (01/03 1437) Temp src: Oral (01/03 1437) BP: 118/59 mmHg (01/03 1437) Pulse Rate: 79  (01/03 1437)  Labs:  Basename 04/22/11 1830 04/22/11 0939 04/22/11 0417 04/21/11 2330 04/21/11 0500 04/20/11 0440 04/20/11 0025  HGB -- -- 11.0* -- 10.3* -- --  HCT -- -- 31.4* -- 29.0* 31.8* --  PLT -- -- 179 -- 148* 156 --  APTT -- -- -- -- -- -- --  LABPROT -- -- -- -- -- -- --  INR -- -- -- -- -- -- --  HEPARINUNFRC 0.42 0.68 -- 0.28* -- -- --  CREATININE -- -- 1.31 -- 1.45* 1.36* --  CKTOTAL -- -- -- -- -- -- 622*  CKMB -- -- -- -- -- -- 9.3*  TROPONINI -- -- -- -- -- -- 1.87*   Estimated Creatinine Clearance: 41.2 ml/min (by C-G formula based on Cr of 1.31).   Medications:  Scheduled:     . amLODipine  5 mg Oral Daily  . aspirin  324 mg Oral Pre-Cath  . aspirin EC  81 mg Oral Daily  . carvedilol  12.5 mg Oral BID WC  . citalopram  20 mg Oral Daily  . clotrimazole  1 application Topical BID  . diazepam  5 mg Oral On Call  . insulin aspart  0-5 Units Subcutaneous QHS  . insulin aspart  0-9 Units Subcutaneous TID WC  . insulin NPH  25 Units Subcutaneous BID AC  . levofloxacin (LEVAQUIN) IV  750 mg Intravenous Q48H  . pantoprazole  40 mg Oral Q1200  . rosuvastatin  20 mg Oral q1800  . sodium chloride  3 mL Intravenous Q12H  . sodium chloride  3 mL Intravenous Q12H     Assessment:  [name] on IV heparin for NTEMI   Plan for cath tomorrow   Heparin level now therapeutic x 2  No bleeding events reported in chart notes  Goal of Therapy:  Heparin level 0.3-0.7 units/ml   Plan:  1) Continue heparin at 800 units/hr (8 ml/hr) 2) Recheck heparin level in am 3) F/U anticoagulation plans after cath  tomorrow  Emiliano Dyer 04/22/2011,8:26 PM Pager: [phone]

## 2011-04-22 NOTE — Progress Notes (Signed)
ANTICOAGULATION CONSULT NOTE - Follow Up Consult  Pharmacy Consult for IV Heparin Indication: NSTEMI  No Known Allergies  Patient Measurements: Height: 5\' 10"  (177.8 cm) Weight: 155 lb 10.3 oz (70.6 kg) IBW/kg (Calculated) : 73   Vital Signs:    Labs:  Basename 04/22/11 0939 04/22/11 0417 04/21/11 2330 04/21/11 1420 04/21/11 0500 04/20/11 0440 04/20/11 0025 04/19/11 1600 04/19/11 1530 04/19/11 1215  HGB -- 11.0* -- -- 10.3* -- -- -- -- --  HCT -- 31.4* -- -- 29.0* 31.8* -- -- -- --  PLT -- 179 -- -- 148* 156 -- -- -- --  APTT -- -- -- -- -- -- -- 29 -- --  LABPROT -- -- -- -- -- -- -- -- -- --  INR -- -- -- -- -- -- -- -- -- --  HEPARINUNFRC 0.68 -- 0.28* 0.80* -- -- -- -- -- --  CREATININE -- 1.31 -- -- 1.45* 1.36* -- -- -- --  CKTOTAL -- -- -- -- -- -- 622* -- 773* 649*  CKMB -- -- -- -- -- -- 9.3* -- 16.3* 14.4*  TROPONINI -- -- -- -- -- -- 1.87* -- 3.27* 2.91*   Estimated Creatinine Clearance: 41.2 ml/min (by C-G formula based on Cr of 1.31).   Medications:  Scheduled:    . amLODipine  5 mg Oral Daily  . aspirin EC  81 mg Oral Daily  . carvedilol  12.5 mg Oral BID WC  . citalopram  20 mg Oral Daily  . clotrimazole  1 application Topical BID  . insulin aspart  0-5 Units Subcutaneous QHS  . insulin aspart  0-9 Units Subcutaneous TID WC  . insulin NPH  25 Units Subcutaneous BID AC  . levofloxacin (LEVAQUIN) IV  750 mg Intravenous Q48H  . pantoprazole  40 mg Oral Q1200  . rosuvastatin  20 mg Oral q1800  . sodium chloride  3 mL Intravenous Q12H   Infusions:    . heparin 800 Units/hr (04/22/11 0100)  . DISCONTD: heparin 700 Units/hr (04/21/11 2046)   PRN: sodium chloride, albuterol, dextrose, ipratropium, lactulose, nitroGLYCERIN, sodium chloride  Assessment: [name] on IV heparin for NTEMI. Plan for cath tomorrow noted in cardiologist's note noted. Most recent heparin level therapeutic. Will repeat heparin level in 8 hours to confirm therapeutic dose. No  bleeding events reported in chart notes.  Goal of Therapy:  Heparin level 0.3-0.7 units/ml   Plan:  1) Continue heparin at 800 units/hr (8 ml/hr) 2) Recheck heparin level at 6pm tonight.  Samul Dada 04/22/2011,10:56 AM

## 2011-04-23 ENCOUNTER — Encounter (HOSPITAL_COMMUNITY)
Admission: EM | Disposition: A | Discharge status: Home / Self Care | Payer: Self-pay | Source: Home / Self Care | Attending: Internal Medicine

## 2011-04-23 DIAGNOSIS — I2510 Atherosclerotic heart disease of native coronary artery without angina pectoris: Secondary | ICD-10-CM

## 2011-04-23 HISTORY — PX: LEFT HEART CATHETERIZATION WITH CORONARY ANGIOGRAM: SHX5451

## 2011-04-23 LAB — BASIC METABOLIC PANEL
BUN: 20 mg/dL (ref 6–23)
Chloride: 104 mEq/L (ref 96–112)
Creatinine, Ser: 1.36 mg/dL — ABNORMAL HIGH (ref 0.50–1.35)
GFR calc Af Amer: 53 mL/min — ABNORMAL LOW (ref >90)
GFR calc non Af Amer: 46 mL/min — ABNORMAL LOW (ref >90)
Potassium: 3.3 mEq/L — ABNORMAL LOW (ref 3.5–5.1)

## 2011-04-23 LAB — GLUCOSE, CAPILLARY
Glucose-Capillary: 94 mg/dL (ref 70–99)
Glucose-Capillary: 62 mg/dL — ABNORMAL LOW (ref 70–99)
Glucose-Capillary: 73 mg/dL (ref 70–99)
Glucose-Capillary: 75 mg/dL (ref 70–99)
Glucose-Capillary: 244 mg/dL — ABNORMAL HIGH (ref 70–99)

## 2011-04-23 LAB — CBC
Platelets: 208 10*3/uL (ref 150–400)
RBC: 3.28 MIL/uL — ABNORMAL LOW (ref 4.22–5.81)
RDW: 12.6 % (ref 11.5–15.5)
WBC: 5 10*3/uL (ref 4.0–10.5)

## 2011-04-23 LAB — HEPARIN LEVEL (UNFRACTIONATED): Heparin Unfractionated: 0.33 IU/mL (ref 0.30–0.70)

## 2011-04-23 SURGERY — LEFT HEART CATHETERIZATION WITH CORONARY ANGIOGRAM
Anesthesia: LOCAL

## 2011-04-23 MED ORDER — DEXTROSE 50 % IV SOLN: 25.0000 mL | Freq: Once | INTRAVENOUS | Status: DC

## 2011-04-23 MED ORDER — LIDOCAINE HCL (PF) 1 % IJ SOLN
INTRAMUSCULAR | Status: AC
  Filled 2011-04-23: qty 30

## 2011-04-23 MED ORDER — SODIUM CHLORIDE 0.9 % IV SOLN: 1.0000 mL/kg/h | INTRAVENOUS | Status: AC

## 2011-04-23 MED ORDER — DEXTROSE 50 % IV SOLN
INTRAVENOUS | Status: AC
  Administered 2011-04-23: 25 mL
  Filled 2011-04-23: qty 50

## 2011-04-23 MED ORDER — VERAPAMIL HCL 2.5 MG/ML IV SOLN
INTRAVENOUS | Status: AC
  Filled 2011-04-23: qty 2

## 2011-04-23 MED ORDER — MIDAZOLAM HCL 2 MG/2ML IJ SOLN
INTRAMUSCULAR | Status: AC
  Filled 2011-04-23: qty 2

## 2011-04-23 MED ORDER — HEPARIN (PORCINE) IN NACL 2-0.9 UNIT/ML-% IJ SOLN
INTRAMUSCULAR | Status: AC
  Filled 2011-04-23: qty 2000

## 2011-04-23 MED ORDER — POTASSIUM CHLORIDE CRYS ER 20 MEQ PO TBCR
20.0000 meq | EXTENDED_RELEASE_TABLET | Freq: Once | ORAL | Status: AC
  Administered 2011-04-23: 20 meq via ORAL
  Filled 2011-04-23: qty 1

## 2011-04-23 MED ORDER — HEPARIN SODIUM (PORCINE) 1000 UNIT/ML IJ SOLN
INTRAMUSCULAR | Status: AC
  Filled 2011-04-23: qty 1

## 2011-04-23 MED ORDER — FENTANYL CITRATE 0.05 MG/ML IJ SOLN
INTRAMUSCULAR | Status: AC
  Filled 2011-04-23: qty 2

## 2011-04-23 NOTE — H&P (View-Only) (Signed)
TELEMETRY: Reviewed telemetry pt in sinus brady rate 56: Filed Vitals:   04/21/11 2113 04/22/11 1437 04/22/11 2200 04/23/11 0500  BP: 138/71 118/59 132/78 192/84  Pulse: 56 79 62 71  Temp: 97.5 F (36.4 C) 98.2 F (36.8 C) 97.8 F (36.6 C) 97.3 F (36.3 C)  TempSrc: Oral Oral    Resp: 18 18 14 16   Height:      Weight:    70.943 kg (156 lb 6.4 oz)  SpO2: 97% 98% 96% 96%    Intake/Output Summary (Last 24 hours) at 04/23/11 0721 Last data filed at 04/23/11 0600  Gross per 24 hour  Intake 1926.71 ml  Output    585 ml  Net 1341.71 ml    SUBJECTIVE Patient denies any chest pain or SOB. Woke up in a sweat today.  LABS: Basic Metabolic Panel:  Basename 04/23/11 0440 04/22/11 0417 04/21/11 0500  NA 132* 133* --  K 3.3* 3.9 --  CL 104 103 --  CO2 21 19 --  GLUCOSE 52* 99 --  BUN 20 26* --  CREATININE 1.36* 1.31 --  CALCIUM 8.2* 8.2* --  MG -- -- 1.8  PHOS -- -- --   Liver Function Tests:  Basename 04/22/11 0417  AST 40*  ALT 29  ALKPHOS 80  BILITOT 0.3  PROT 6.1  ALBUMIN 2.2*   No results found for this basename: LIPASE:2,AMYLASE:2 in the last 72 hours CBC:  Basename 04/23/11 0440 04/22/11 0417  WBC 5.0 4.7  NEUTROABS -- --  HGB 10.5* 11.0*  HCT 29.5* 31.4*  MCV 89.9 90.8  PLT 208 179   Cardiac Enzymes: No results found for this basename: CKTOTAL:3,CKMB:3,CKMBINDEX:3,TROPONINI:3 in the last 72 hours BNP: No components found with this basename: POCBNP:3 D-Dimer: No results found for this basename: DDIMER:2 in the last 72 hours Hemoglobin A1C: No results found for this basename: HGBA1C in the last 72 hours Fasting Lipid Panel: No results found for this basename: CHOL,HDL,LDLCALC,TRIG,CHOLHDL,LDLDIRECT in the last 72 hours Thyroid Function Tests: No results found for this basename: TSH,T4TOTAL,FREET3,T3FREE,THYROIDAB in the last 72 hours Anemia Panel:  Basename 04/21/11 0500  VITAMINB12 709  FOLATE >20.0  FERRITIN 622*  TIBC 139*  IRON 35*    RETICCTPCT 0.5    Radiology/Studies:  Dg Chest 2 View  04/19/2011  *RADIOLOGY REPORT*  Clinical Data: Generalized weakness.  Cough.  Shortness of breath. Chest congestion.  CHEST - 2 VIEW 04/19/2011:  Comparison: Two-view chest x-ray 12/25/2008, 06/28/2007, 04/18/2007, 10/04/2006 [place].  Findings: Patchy airspace opacities in the right lower lobe, new since the prior examinations.  Lungs remain clear otherwise. Cardiac silhouette upper normal in size for the AP technique, unchanged.  Thoracic aorta atherosclerotic, unchanged.  Hilar and mediastinal contours otherwise.  No pleural effusions. Degenerative changes involving the thoracic  IMPRESSION: Right lower lobe pneumonia.  Original Report Authenticated By: Deniece Portela, M.D.   Ct Head Wo Contrast  04/19/2011  *RADIOLOGY REPORT*  Clinical Data: Weakness.  Upper respiratory tract infection.  CT HEAD WITHOUT CONTRAST  Technique:  Contiguous axial images were obtained from the base of the skull through the vertex without contrast.  Comparison: None.  Findings: The brain shows generalized atrophy.  There is mild chronic small vessel disease affecting the hemispheric deep white matter.  No sign of acute infarction, mass lesion, hemorrhage, hydrocephalus or extra-axial collection.  No skull fracture.  There is minimal mucosal inflammation of the paranasal sinuses.  IMPRESSION: No acute or reversible findings.  Age related atrophy  and chronic small vessel disease.  Original Report Authenticated By: Jules Schick, M.D.   ECG shows NSR without acute ST/T changes.  PHYSICAL EXAM General: Well developed, well nourished, in no acute distress. Head: Normocephalic, atraumatic, sclera non-icteric, no xanthomas, nares are without discharge. Neck: Negative for carotid bruits. JVD not elevated. Lungs: Bilateral  rhonchi R>L. Breathing is unlabored. Heart: RRR S1 S2 with grade 2/6 apical murmur Abdomen: Soft, non-tender, non-distended with  normoactive bowel sounds. No hepatomegaly. No rebound/guarding. No obvious abdominal masses. Msk:  Strength and tone appears normal for age. Extremities: No clubbing, cyanosis or edema.  Distal pedal pulses are 2+ and equal bilaterally. Neuro: Alert and oriented X 3. Moves all extremities spontaneously. Psych:  Responds to questions appropriately with a normal affect.  ASSESSMENT AND PLAN:  1. NSTEMI in setting of acute CAP. Ecg without acute change. Cardiac enzymes have peaked. Multiple risk factors including DM, HTN. Will continue IV heparin. On BB, ASA and statin Rx. Echo shows normal systolic function. Plan cardiac cath today.   2. CAP now afebrile, improving  3. DM type 2  4. HTN now well controlled.  5. Acute on chronic renal insufficiency. Creatnine stable. Patient hydrated overnight.  6. Anemia.   7. Hypokalemia will replete.  Principal Problem:  *NSTEMI (non-ST elevated myocardial infarction) Active Problems:  DIABETES MELLITUS, TYPE I, CONTROLLED, WITH RETINOPATHY  HYPERLIPIDEMIA  HYPERTENSION  GERD  CKD (chronic kidney disease) stage 3, GFR 30-59 ml/min    Signed, Shanicka Oldenkamp Martinique MD, [facility] 04/411 @ 7:23 am

## 2011-04-23 NOTE — Progress Notes (Signed)
Saw Mr Sturch on his way to [facility] for cath. He denies any complaints.    1. Community acquired pneumonia   2. Hyperglycemia   3. Hyponatremia   4. Weakness   5. NSTEMI (non-ST elevated myocardial infarction)     Past Medical History  Diagnosis Date  . ANEMIA-NOS 02/17/2007  . DEPRESSION 12/25/2008  . HYPERTENSION 02/17/2007  . GERD 02/17/2007  . OSTEOARTHRITIS 03/04/2010  . DIABETES MELLITUS, TYPE I, CONTROLLED, WITH RETINOPATHY   . Gastroparesis   . BENIGN PROSTATIC HYPERTROPHY   . VITAMIN B12 DEFICIENCY   . BACK PAIN, LUMBAR   . LIVER DISORDER   . PSA, INCREASED    Current Facility-Administered Medications  Medication Dose Route Frequency Provider Last Rate Last Dose  . 0.9 %  sodium chloride infusion  250 mL Intravenous PRN Mayer Masker      . 0.9 %  sodium chloride infusion  250 mL Intravenous PRN Peter Martinique, MD      . 0.9 %  sodium chloride infusion  1 mL/kg/hr Intravenous Continuous Peter Martinique, MD 70.6 mL/hr at 04/23/11 1438 1 mL/kg/hr at 04/23/11 1438  . 0.9 %  sodium chloride infusion  1 mL/kg/hr Intravenous Continuous Peter Martinique, MD 70.9 mL/hr at 04/23/11 1800 1 mL/kg/hr at 04/23/11 1800  . ipratropium (ATROVENT) nebulizer solution 0.5 mg  0.5 mg Nebulization Q6H PRN Mayer Masker       And  . albuterol (PROVENTIL) (5 MG/ML) 0.5% nebulizer solution 2.5 mg  2.5 mg Nebulization Q6H PRN Mayer Masker      . amLODipine (NORVASC) tablet 5 mg  5 mg Oral Daily Peter Martinique, MD   5 mg at 04/23/11 1016  . aspirin chewable tablet 324 mg  324 mg Oral Pre-Cath Peter Martinique, MD   324 mg at 04/23/11 0646  . aspirin EC tablet 81 mg  81 mg Oral Daily Mayer Masker   81 mg at 04/22/11 1055  . carvedilol (COREG) tablet 12.5 mg  12.5 mg Oral BID WC Peter Martinique, MD   12.5 mg at 04/23/11 0744  . citalopram (CELEXA) tablet 20 mg  20 mg Oral Daily Mayer Masker   20 mg at 04/22/11 1055  . clotrimazole (LOTRIMIN) 1 % cream 1 application  1  application Topical BID Mayer Masker   1 application at [id number]  . dextrose (GLUTOSE) 40 % oral gel 37.5 g  1 Tube Oral PRN Connelly [place]   37.5 g at 04/22/11 0204  . dextrose 50 % solution 25 mL  25 mL Intravenous Once Banessa Mao      . dextrose 50 % solution 25 mL  25 mL Intravenous Once Earon Rivest      . dextrose 50 % solution        25 mL at 04/23/11 0740  . dextrose 50 % solution        25 mL at 04/23/11 1235  . diazepam (VALIUM) tablet 5 mg  5 mg Oral On Call Peter Martinique, MD   5 mg at 04/23/11 1625  . fentaNYL (SUBLIMAZE) 0.05 MG/ML injection           . heparin 1000 UNIT/ML injection           . heparin 2-0.9 UNIT/ML-% infusion           . heparin ADULT infusion 100 units/ml (25000 units/250 ml)  800 Units/hr Intravenous Continuous Dorrene German, PHARMD   8 mL/hr at 04/23/11 1438  . insulin  aspart (novoLOG) injection 0-5 Units  0-5 Units Subcutaneous QHS Mayer Masker   3 Units at 04/19/11 2301  . insulin aspart (novoLOG) injection 0-9 Units  0-9 Units Subcutaneous TID WC Mayer Masker   2 Units at 04/22/11 1844  . insulin NPH (HUMULIN N,NOVOLIN N) injection 25 Units  25 Units Subcutaneous BID AC Mayer Masker   25 Units at 04/22/11 1845  . lactulose (CHRONULAC) 10 GM/15ML solution 10 g  10 g Oral BID PRN Mayer Masker   10 g at 04/21/11 2259  . Levofloxacin (LEVAQUIN) IVPB 750 mg  750 mg Intravenous Q48H Gaye Alken [place], PHARMD   750 mg at 04/23/11 1220  . lidocaine (XYLOCAINE) 1 % injection           . midazolam (VERSED) 2 MG/2ML injection           . nitroGLYCERIN (NITROSTAT) SL tablet 0.4 mg  0.4 mg Sublingual Q5 min PRN Danella Sensing      . pantoprazole (PROTONIX) EC tablet 40 mg  40 mg Oral Q1200 Mayer Masker   40 mg at 04/22/11 1245  . potassium chloride SA (K-DUR,KLOR-CON) CR tablet 20 mEq  20 mEq Oral Once Peter Martinique, MD   20 mEq at 04/23/11 1012  . rosuvastatin (CRESTOR) tablet 20 mg  20 mg Oral q1800 Roger  Arguello   20 mg at 04/22/11 1847  . sodium chloride 0.9 % injection 3 mL  3 mL Intravenous Q12H Mayer Masker   3 mL at 04/19/11 2141  . sodium chloride 0.9 % injection 3 mL  3 mL Intravenous PRN Payton Spark Goodrich   10 mL at 04/23/11 1020  . sodium chloride 0.9 % injection 3 mL  3 mL Intravenous Q12H Peter Martinique, MD   3 mL at 04/22/11 2350  . sodium chloride 0.9 % injection 3 mL  3 mL Intravenous PRN Peter Martinique, MD      . verapamil (ISOPTIN) 2.5 MG/ML injection            No Known Allergies Principal Problem:  *NSTEMI (non-ST elevated myocardial infarction) Active Problems:  DIABETES MELLITUS, TYPE I, CONTROLLED, WITH RETINOPATHY  HYPERLIPIDEMIA  HYPERTENSION  GERD  CKD (chronic kidney disease) stage 3, GFR 30-59 ml/min   Vital signs in last 24 hours: Temp:  [97.3 F (36.3 C)-97.8 F (36.6 C)] 97.3 F (36.3 C) (01/04 1437) Pulse Rate:  [61-71] 65  (01/04 1701) Resp:  [14-16] 16  (01/04 1437) BP: (132-192)/(57-84) 135/57 mmHg (01/04 1437) SpO2:  [96 %-97 %] 97 % (01/04 1437) Weight:  [70.943 kg (156 lb 6.4 oz)] 156 lb 6.4 oz (70.943 kg) (01/04 0500) Weight change:  Last BM Date: 04/22/11  Intake/Output from previous day: 01/03 0701 - 01/04 0700 In: 1926.7 [P.O.:840; I.V.:1086.7] Out: 585 [Urine:585] Intake/Output this shift:    Lab Results:  Basename 04/23/11 0440 04/22/11 0417  WBC 5.0 4.7  HGB 10.5* 11.0*  HCT 29.5* 31.4*  PLT 208 179   BMET  Basename 04/23/11 0440 04/22/11 0417  NA 132* 133*  K 3.3* 3.9  CL 104 103  CO2 21 19  GLUCOSE 52* 99  BUN 20 26*  CREATININE 1.36* 1.31  CALCIUM 8.2* 8.2*    Studies/Results: No results found.  Medications: I have reviewed the patient's current medications.   Physical exam GENERAL- alert HEAD- normal atraumatic, no neck masses, normal thyroid, no jvd RESPIRATORY- appears well, vitals normal, no respiratory distress, acyanotic, normal RR, ear and throat exam  is normal, neck free of mass or  lymphadenopathy, chest clear, no wheezing, crepitations, rhonchi, normal symmetric air entry CVS- regular rate and rhythm, S1, S2 normal, no murmur, click, rub or gallop ABDOMEN- abdomen is soft without significant tenderness, masses, organomegaly or guarding NEURO- Grossly normal EXTREMITIES- extremities normal, atraumatic, no cyanosis or edema  Plan  *NSTEMI (non-ST elevated myocardial infarction)- stable. cardiac cath shows nonobstructive coronary disease. Cardiology appreciated. Risk factor modification. *CAP- improving, afebrile. Continue abx to complete at least 7 days.  *chronic medical conditions as below stable.  DIABETES MELLITUS, TYPE I, CONTROLLED, WITH RETINOPATHY  HYPERLIPIDEMIA  HYPERTENSION  GERD  CKD (chronic kidney disease) stage 3, GFR 30-59 ml/min- agree with rehydration.          Saphyre Cillo 04/23/2011 7:26 PM Pager: [phone].

## 2011-04-23 NOTE — Progress Notes (Signed)
CBG: 62  Treatment: D50 IV 25 mL  Symptoms: None  Follow-up CBG: Time : 1305 CBG Result: 116  Possible Reasons for Event: Other: NPO for procedure  Comments/MD notified:    [name]

## 2011-04-23 NOTE — Interval H&P Note (Signed)
History and Physical Interval Note:  04/23/2011 5:06 PM  Eric [name]  has presented today for surgery, with the diagnosis of chest pain  The various methods of treatment have been discussed with the patient and family. After consideration of risks, benefits and other options for treatment, the patient has consented to  Procedure(s): [place] as a surgical intervention .  The patients' history has been reviewed, patient examined, no change in status, stable for surgery.  I have reviewed the patients' chart and labs.  Questions were answered to the patient's satisfaction.     Collier Salina [facility]

## 2011-04-23 NOTE — Progress Notes (Signed)
Inpatient Diabetes Program Recommendations  AACE/ADA: New Consensus Statement on Inpatient Glycemic Control (2009)  Target Ranges:  Prepandial:   less than 140 mg/dL      Peak postprandial:   less than 180 mg/dL (1-2 hours)      Critically ill patients:  140 - 180 mg/dL   Reason for Visit: Hypoglycemia  Inpatient Diabetes Program Recommendations Insulin - Basal: Decrease NPH to 18 units bid

## 2011-04-23 NOTE — Progress Notes (Signed)
TELEMETRY: Reviewed telemetry pt in sinus brady rate 56: Filed Vitals:   04/21/11 2113 04/22/11 1437 04/22/11 2200 04/23/11 0500  BP: 138/71 118/59 132/78 192/84  Pulse: 56 79 62 71  Temp: 97.5 F (36.4 C) 98.2 F (36.8 C) 97.8 F (36.6 C) 97.3 F (36.3 C)  TempSrc: Oral Oral    Resp: 18 18 14 16   Height:      Weight:    70.943 kg (156 lb 6.4 oz)  SpO2: 97% 98% 96% 96%    Intake/Output Summary (Last 24 hours) at 04/23/11 0721 Last data filed at 04/23/11 0600  Gross per 24 hour  Intake 1926.71 ml  Output    585 ml  Net 1341.71 ml    SUBJECTIVE Patient denies any chest pain or SOB. Woke up in a sweat today.  LABS: Basic Metabolic Panel:  Basename 04/23/11 0440 04/22/11 0417 04/21/11 0500  NA 132* 133* --  K 3.3* 3.9 --  CL 104 103 --  CO2 21 19 --  GLUCOSE 52* 99 --  BUN 20 26* --  CREATININE 1.36* 1.31 --  CALCIUM 8.2* 8.2* --  MG -- -- 1.8  PHOS -- -- --   Liver Function Tests:  Basename 04/22/11 0417  AST 40*  ALT 29  ALKPHOS 80  BILITOT 0.3  PROT 6.1  ALBUMIN 2.2*   No results found for this basename: LIPASE:2,AMYLASE:2 in the last 72 hours CBC:  Basename 04/23/11 0440 04/22/11 0417  WBC 5.0 4.7  NEUTROABS -- --  HGB 10.5* 11.0*  HCT 29.5* 31.4*  MCV 89.9 90.8  PLT 208 179   Cardiac Enzymes: No results found for this basename: CKTOTAL:3,CKMB:3,CKMBINDEX:3,TROPONINI:3 in the last 72 hours BNP: No components found with this basename: POCBNP:3 D-Dimer: No results found for this basename: DDIMER:2 in the last 72 hours Hemoglobin A1C: No results found for this basename: HGBA1C in the last 72 hours Fasting Lipid Panel: No results found for this basename: CHOL,HDL,LDLCALC,TRIG,CHOLHDL,LDLDIRECT in the last 72 hours Thyroid Function Tests: No results found for this basename: TSH,T4TOTAL,FREET3,T3FREE,THYROIDAB in the last 72 hours Anemia Panel:  Basename 04/21/11 0500  VITAMINB12 709  FOLATE >20.0  FERRITIN 622*  TIBC 139*  IRON 35*    RETICCTPCT 0.5    Radiology/Studies:  Dg Chest 2 View  04/19/2011  *RADIOLOGY REPORT*  Clinical Data: Generalized weakness.  Cough.  Shortness of breath. Chest congestion.  CHEST - 2 VIEW 04/19/2011:  Comparison: Two-view chest x-ray 12/25/2008, 06/28/2007, 04/18/2007, 10/04/2006 [place].  Findings: Patchy airspace opacities in the right lower lobe, new since the prior examinations.  Lungs remain clear otherwise. Cardiac silhouette upper normal in size for the AP technique, unchanged.  Thoracic aorta atherosclerotic, unchanged.  Hilar and mediastinal contours otherwise.  No pleural effusions. Degenerative changes involving the thoracic  IMPRESSION: Right lower lobe pneumonia.  Original Report Authenticated By: Deniece Portela, M.D.   Ct Head Wo Contrast  04/19/2011  *RADIOLOGY REPORT*  Clinical Data: Weakness.  Upper respiratory tract infection.  CT HEAD WITHOUT CONTRAST  Technique:  Contiguous axial images were obtained from the base of the skull through the vertex without contrast.  Comparison: None.  Findings: The brain shows generalized atrophy.  There is mild chronic small vessel disease affecting the hemispheric deep white matter.  No sign of acute infarction, mass lesion, hemorrhage, hydrocephalus or extra-axial collection.  No skull fracture.  There is minimal mucosal inflammation of the paranasal sinuses.  IMPRESSION: No acute or reversible findings.  Age related atrophy  and chronic small vessel disease.  Original Report Authenticated By: Jules Schick, M.D.   ECG shows NSR without acute ST/T changes.  PHYSICAL EXAM General: Well developed, well nourished, in no acute distress. Head: Normocephalic, atraumatic, sclera non-icteric, no xanthomas, nares are without discharge. Neck: Negative for carotid bruits. JVD not elevated. Lungs: Bilateral  rhonchi R>L. Breathing is unlabored. Heart: RRR S1 S2 with grade 2/6 apical murmur Abdomen: Soft, non-tender, non-distended with  normoactive bowel sounds. No hepatomegaly. No rebound/guarding. No obvious abdominal masses. Msk:  Strength and tone appears normal for age. Extremities: No clubbing, cyanosis or edema.  Distal pedal pulses are 2+ and equal bilaterally. Neuro: Alert and oriented X 3. Moves all extremities spontaneously. Psych:  Responds to questions appropriately with a normal affect.  ASSESSMENT AND PLAN:  1. NSTEMI in setting of acute CAP. Ecg without acute change. Cardiac enzymes have peaked. Multiple risk factors including DM, HTN. Will continue IV heparin. On BB, ASA and statin Rx. Echo shows normal systolic function. Plan cardiac cath today.   2. CAP now afebrile, improving  3. DM type 2  4. HTN now well controlled.  5. Acute on chronic renal insufficiency. Creatnine stable. Patient hydrated overnight.  6. Anemia.   7. Hypokalemia will replete.  Principal Problem:  *NSTEMI (non-ST elevated myocardial infarction) Active Problems:  DIABETES MELLITUS, TYPE I, CONTROLLED, WITH RETINOPATHY  HYPERLIPIDEMIA  HYPERTENSION  GERD  CKD (chronic kidney disease) stage 3, GFR 30-59 ml/min    Signed, Nikeya Maxim Martinique MD, [facility] 04/411 @ 7:23 am

## 2011-04-23 NOTE — Progress Notes (Signed)
Recheck CBG value: 94. [name]

## 2011-04-23 NOTE — Progress Notes (Signed)
CRITICAL VALUE ALERT  Critical value received: CBG  Date of notification:  t  Time of notification:  0734  Critical value read back:no  Nurse who received alert:  D.Kathyann Spaugh,RN  MD notified (1st page):    Time of first page:    D notified (2nd page):  Time of second page:  Responding MD:    Time MD responded:    Given 70ml of D50 IV.  Will recheck CBG.  [name]

## 2011-04-23 NOTE — Op Note (Signed)
Cardiac Catheterization Procedure Note  Name: Eric [name] MRN: [id number] DOB: 13-Mar-1924  Procedure: Left Heart Cath, Selective Coronary Angiography, LV angiography  Indication: 78 year old African American male admitted with an acute community-acquired pneumonia. Cardiac enzymes were consistent with a non-ST elevation myocardial infarction.   Procedural Details: The right wrist was prepped, draped, and anesthetized with 1% lidocaine. Using the modified Seldinger technique, a 5 French sheath was introduced into the right radial artery. 3 mg of verapamil was administered through the sheath, weight-based unfractionated heparin was administered intravenously. Standard Judkins catheters were used for selective coronary angiography and left ventriculography. Catheter exchanges were performed over an exchange length guidewire. There were no immediate procedural complications. A TR band was used for radial hemostasis at the completion of the procedure.  The patient was transferred to the post catheterization recovery area for further monitoring.  Procedural Findings: Hemodynamics: AO 152/65 with a mean of 100 mm mercury LV 152/22 mmHg  Coronary angiography: Coronary dominance: right  Left mainstem: Short and normal.  Left anterior descending (LAD): 30-40% disease in the proximal vessel. No other significant disease noted.  Left circumflex (LCx): Less than 10% irregularities.  Right coronary artery (RCA): 10-20% plaque noted in the proximal and mid vessel.  Left ventriculography: Left ventricular systolic function is normal, LVEF is estimated at 55-65%, there is no significant mitral regurgitation   Final Conclusions:   1. Nonobstructive coronary disease. 2. Normal left ventricular function.  Recommendations: Based on these findings it appears that his cardiac enzyme elevation was related to the stress of his acute pneumonia. Would continue risk factor modification.  Collier Salina  [facility] 04/23/2011, 5:42 PM

## 2011-04-23 NOTE — Progress Notes (Signed)
ANTICOAGULATION CONSULT NOTE - Follow Up Consult  Pharmacy Consult for IV Heparin Indication: NSTEMI  No Known Allergies  Patient Measurements: Height: 5\' 10"  (177.8 cm) Weight: 156 lb 6.4 oz (70.943 kg) IBW/kg (Calculated) : 73   Vital Signs: Temp: 97.3 F (36.3 C) (01/04 0500) BP: 192/84 mmHg (01/04 0500) Pulse Rate: 71  (01/04 0500)  Labs:  Basename 04/23/11 0440 04/22/11 1830 04/22/11 0939 04/22/11 0417 04/21/11 0500  HGB 10.5* -- -- 11.0* --  HCT 29.5* -- -- 31.4* 29.0*  PLT 208 -- -- 179 148*  APTT -- -- -- -- --  LABPROT -- -- -- -- --  INR -- -- -- -- --  HEPARINUNFRC 0.33 0.42 0.68 -- --  CREATININE 1.36* -- -- 1.31 1.45*  CKTOTAL -- -- -- -- --  CKMB -- -- -- -- --  TROPONINI -- -- -- -- --   Estimated Creatinine Clearance: 39.8 ml/min (by C-G formula based on Cr of 1.36).   Medications:  Scheduled:     . amLODipine  5 mg Oral Daily  . aspirin  324 mg Oral Pre-Cath  . aspirin EC  81 mg Oral Daily  . carvedilol  12.5 mg Oral BID WC  . citalopram  20 mg Oral Daily  . clotrimazole  1 application Topical BID  . diazepam  5 mg Oral On Call  . insulin aspart  0-5 Units Subcutaneous QHS  . insulin aspart  0-9 Units Subcutaneous TID WC  . insulin NPH  25 Units Subcutaneous BID AC  . levofloxacin (LEVAQUIN) IV  750 mg Intravenous Q48H  . pantoprazole  40 mg Oral Q1200  . rosuvastatin  20 mg Oral q1800  . sodium chloride  3 mL Intravenous Q12H  . sodium chloride  3 mL Intravenous Q12H   Infusions:     . sodium chloride 1 mL/kg/hr (04/22/11 1956)  . heparin 800 Units/hr (04/22/11 0100)   PRN: sodium chloride, sodium chloride, albuterol, dextrose, ipratropium, lactulose, nitroGLYCERIN, sodium chloride, sodium chloride  Assessment: [name] on IV heparin for NTEMI. Plan for cath this morning. Most recent heparin level therapeutic. No bleeding events reported in chart notes.  Goal of Therapy:  Heparin level 0.3-0.7 units/ml   Plan:  1) Continue  heparin at 800 units/hr (8 ml/hr) until d/c'd by MD for cath. 2) Will await for "resume heparin per pharmacy" orders post-cath (if clinically indicated)  Theda Sers, Lorie Phenix 04/23/2011,7:06 AM

## 2011-04-24 DIAGNOSIS — I214 Non-ST elevation (NSTEMI) myocardial infarction: Secondary | ICD-10-CM

## 2011-04-24 DIAGNOSIS — J189 Pneumonia, unspecified organism: Secondary | ICD-10-CM | POA: Diagnosis present

## 2011-04-24 LAB — GLUCOSE, CAPILLARY
Glucose-Capillary: 234 mg/dL — ABNORMAL HIGH (ref 70–99)
Glucose-Capillary: 57 mg/dL — ABNORMAL LOW (ref 70–99)
Glucose-Capillary: 111 mg/dL — ABNORMAL HIGH (ref 70–99)

## 2011-04-24 MED ORDER — NITROGLYCERIN 0.4 MG SL SUBL: 0.4000 mg | SUBLINGUAL_TABLET | SUBLINGUAL | Status: DC | PRN

## 2011-04-24 MED ORDER — FUROSEMIDE 80 MG PO TABS: 40.0000 mg | ORAL_TABLET | Freq: Every day | ORAL | Status: DC

## 2011-04-24 MED ORDER — AMLODIPINE BESYLATE 10 MG PO TABS
10.0000 mg | ORAL_TABLET | Freq: Every day | ORAL | Status: DC
  Administered 2011-04-24: 10 mg via ORAL
  Filled 2011-04-24 (×2): qty 1

## 2011-04-24 MED ORDER — AMLODIPINE BESYLATE 10 MG PO TABS: 10.0000 mg | ORAL_TABLET | Freq: Every day | ORAL | Status: DC

## 2011-04-24 MED ORDER — MOXIFLOXACIN HCL 400 MG PO TABS: 400.0000 mg | ORAL_TABLET | Freq: Every day | ORAL | Status: DC

## 2011-04-24 MED ORDER — CARVEDILOL 12.5 MG PO TABS: 12.5000 mg | ORAL_TABLET | Freq: Two times a day (BID) | ORAL | Status: DC

## 2011-04-24 NOTE — Progress Notes (Signed)
Salli Real, MD, [facility] ABIM Board Certified in Adult Cardiovascular Medicine,Internal Medicine and [place]  Patient did well post catheterization. He reports no recurrent chest pain. The patient a cardiac catheterization of nonobstructive coronary disease. His primary diagnosis community-acquired pneumonia. He's feeling better. He has no cough orthopnea or PND.  Principal Problem:  *NSTEMI (non-ST elevated myocardial infarction) Active Problems:  DIABETES MELLITUS, TYPE I, CONTROLLED, WITH RETINOPATHY  HYPERLIPIDEMIA  HYPERTENSION  GERD  CKD (chronic kidney disease) stage 3, GFR 30-59 ml/min   Past Medical History  Diagnosis Date  . ANEMIA-NOS 02/17/2007  . DEPRESSION 12/25/2008  . HYPERTENSION 02/17/2007  . GERD 02/17/2007  . OSTEOARTHRITIS 03/04/2010  . DIABETES MELLITUS, TYPE I, CONTROLLED, WITH RETINOPATHY   . Gastroparesis   . BENIGN PROSTATIC HYPERTROPHY   . VITAMIN B12 DEFICIENCY   . BACK PAIN, LUMBAR   . LIVER DISORDER   . PSA, INCREASED     Current Facility-Administered Medications  Medication Dose Route Frequency Provider Last Rate Last Dose  . 0.9 %  sodium chloride infusion  1 mL/kg/hr Intravenous Continuous Peter Martinique, MD 70.9 mL/hr at 04/23/11 1800 1 mL/kg/hr at 04/23/11 1800  . ipratropium (ATROVENT) nebulizer solution 0.5 mg  0.5 mg Nebulization Q6H PRN Mayer Masker       And  . albuterol (PROVENTIL) (5 MG/ML) 0.5% nebulizer solution 2.5 mg  2.5 mg Nebulization Q6H PRN Mayer Masker      . amLODipine (NORVASC) tablet 5 mg  5 mg Oral Daily Peter Martinique, MD   5 mg at 04/23/11 1016  . aspirin EC tablet 81 mg  81 mg Oral Daily Mayer Masker   81 mg at 04/22/11 1055  . carvedilol (COREG) tablet 12.5 mg  12.5 mg Oral BID WC Peter Martinique, MD   12.5 mg at 04/24/11 0815  . citalopram (CELEXA) tablet 20 mg  20 mg Oral Daily Mayer Masker   20 mg at 04/22/11 1055  . clotrimazole (LOTRIMIN) 1 % cream 1  application  1 application Topical BID Mayer Masker   1 application at [id number]  . dextrose (GLUTOSE) 40 % oral gel 37.5 g  1 Tube Oral PRN Simbiso Ranga   37.5 g at 04/22/11 0204  . dextrose 50 % solution        25 mL at 04/23/11 1235  . diazepam (VALIUM) tablet 5 mg  5 mg Oral On Call Peter Martinique, MD   5 mg at 04/23/11 1625  . fentaNYL (SUBLIMAZE) 0.05 MG/ML injection           . heparin 1000 UNIT/ML injection           . heparin 2-0.9 UNIT/ML-% infusion           . insulin aspart (novoLOG) injection 0-5 Units  0-5 Units Subcutaneous QHS Mayer Masker   2 Units at 04/23/11 2120  . insulin aspart (novoLOG) injection 0-9 Units  0-9 Units Subcutaneous TID WC Mayer Masker   2 Units at 04/24/11 0815  . insulin NPH (HUMULIN N,NOVOLIN N) injection 25 Units  25 Units Subcutaneous BID AC Mayer Masker   25 Units at 04/24/11 [id number]  . lactulose (CHRONULAC) 10 GM/15ML solution 10 g  10 g Oral BID PRN Mayer Masker   10 g at 04/21/11 2259  . Levofloxacin (LEVAQUIN) IVPB 750 mg  750 mg Intravenous Q48H Gaye Alken [place], PHARMD   750 mg at 04/23/11 1220  .  lidocaine (XYLOCAINE) 1 % injection           . midazolam (VERSED) 2 MG/2ML injection           . nitroGLYCERIN (NITROSTAT) SL tablet 0.4 mg  0.4 mg Sublingual Q5 min PRN Danella Sensing      . pantoprazole (PROTONIX) EC tablet 40 mg  40 mg Oral Q1200 Mayer Masker   40 mg at 04/22/11 1245  . potassium chloride SA (K-DUR,KLOR-CON) CR tablet 20 mEq  20 mEq Oral Once Peter Martinique, MD   20 mEq at 04/23/11 1012  . rosuvastatin (CRESTOR) tablet 20 mg  20 mg Oral q1800 Roger Arguello   20 mg at 04/22/11 1847  . sodium chloride 0.9 % injection 3 mL  3 mL Intravenous Q12H Mayer Masker   3 mL at 04/19/11 2141  . sodium chloride 0.9 % injection 3 mL  3 mL Intravenous PRN Payton Spark Goodrich   10 mL at 04/23/11 1020  . verapamil (ISOPTIN) 2.5 MG/ML injection           . DISCONTD: 0.9 %  sodium chloride  infusion  250 mL Intravenous PRN Mayer Masker      . DISCONTD: 0.9 %  sodium chloride infusion  250 mL Intravenous PRN Peter Martinique, MD      . DISCONTD: 0.9 %  sodium chloride infusion  1 mL/kg/hr Intravenous Continuous Peter Martinique, MD 70.6 mL/hr at 04/23/11 1438 1 mL/kg/hr at 04/23/11 1438  . DISCONTD: dextrose 50 % solution 25 mL  25 mL Intravenous Once Simbiso Ranga      . DISCONTD: dextrose 50 % solution 25 mL  25 mL Intravenous Once Simbiso Ranga      . DISCONTD: heparin ADULT infusion 100 units/ml (25000 units/250 ml)  800 Units/hr Intravenous Continuous Dorrene German, PHARMD   8 mL/hr at 04/23/11 1438  . DISCONTD: sodium chloride 0.9 % injection 3 mL  3 mL Intravenous Q12H Peter Martinique, MD   3 mL at 04/22/11 2350  . DISCONTD: sodium chloride 0.9 % injection 3 mL  3 mL Intravenous PRN Peter Martinique, MD        LABS: Basic Metabolic Panel:  Basename 04/23/11 0440 04/22/11 0417  NA 132* 133*  K 3.3* 3.9  CL 104 103  CO2 21 19  GLUCOSE 52* 99  BUN 20 26*  CREATININE 1.36* 1.31  CALCIUM 8.2* 8.2*  MG -- --  PHOS -- --   Liver Function Tests:  [facility] 04/22/11 0417  AST 40*  ALT 29  ALKPHOS 80  BILITOT 0.3  PROT 6.1  ALBUMIN 2.2*   No results found for this basename: LIPASE:2,AMYLASE:2 in the last 72 hours CBC:  Basename 04/23/11 0440 04/22/11 0417  WBC 5.0 4.7  NEUTROABS -- --  HGB 10.5* 11.0*  HCT 29.5* 31.4*  MCV 89.9 90.8  PLT 208 179    RADIOLOGY: No results found.  PHYSICAL EXAM  Filed Vitals:   04/23/11 1701 04/23/11 2024 04/23/11 2244 04/24/11 0550  BP:  168/78 149/69 157/73  Pulse: 65 60 64 63  Temp:  97.5 F (36.4 C) 97.2 F (36.2 C) 98.8 F (37.1 C)  TempSrc:  Oral Oral   Resp:  16 18 19   Height:      Weight:    155 lb 5.6 oz (70.465 kg)  SpO2:  99% 96% 96%   BP 157/73  Pulse 63  Temp(Src) 98.8 F (37.1 C) (Oral)  Resp 19  Ht 5\' 10"  (1.778  m)  Wt 155 lb 5.6 oz (70.465 kg)  BMI 22.29 kg/m2  SpO2 96%  Intake/Output  Summary (Last 24 hours) at 04/24/11 0841 Last data filed at 04/24/11 0800  Gross per 24 hour  Intake 965.51 ml  Output   1575 ml  Net -609.49 ml   I/O last 3 completed shifts: In: 2292.2 [P.O.:240; I.V.:1752.2; IV Piggyback:300] Out: [id number] [Urine:1485] General: well- nourished. No distress HEENT: normal carotid upstroke. Normal JVP. No thyromegaly Cardiac: RRR, NL S1/S2. No pathologic murmurs Lungs: clear BS bilaterally, no wheezing. Abdomen, soft, non- tender Extremities. No edema. Normal distal pulses. No complications at right radial site from cardiac catheterization. Normal pulses. Skin: warm and dry Psychologic: normal affect.   TELEMETRY: Reviewed telemetry pt in normal sinus rhythm by handheld single-lead telemetry  ASSESSMENT AND PLAN:  1. Community acquired pneumonia   2. Hyperglycemia   3. Hyponatremia   4. Weakness   5. NSTEMI (non-ST elevated myocardial infarction)     Patient Active Problem List  Diagnoses   Community-acquired pneumonia    NSTEMI (non-ST elevated myocardial infarction) status post catheterization of nonobstructive coronary artery disease with normal LV function    CKD (chronic kidney disease) stage 3, GFR 30-59 ml/min  Status post community-acquired pneumonia   Hypertension      PLAN Hypokalemia: I do not see a BMET today. We will order this. Patient may need potassium replacement. Continue risk factor modification Patient could benefit from a statin given the fact that is a diabetic and has nonobstructive coronary artery disease. Would recommend improved control of blood pressure. Increase amlodipine to 10 mg by mouth daily. At this point we have no further recommendations. We will sign off please call back if any further questions  Salli Real 04/24/2011, 8:41 AM

## 2011-04-24 NOTE — Plan of Care (Signed)
Problem: Phase I Progression Outcomes Goal: Vascular site scale level 0 - I Vascular Site Scale Level 0: No bruising/bleeding/hematoma Level I (Mild): Bruising/Ecchymosis, minimal bleeding/ooozing, palpable hematoma < 3 cm Level II (Moderate): Bleeding not affecting hemodynamic parameters, pseudoaneurysm, palpable hematoma > 3 cm  Outcome: Completed/Met Date Met:  04/24/11 Level-o: No bruising/bleeding/hematoma

## 2011-04-24 NOTE — Discharge Summary (Signed)
DISCHARGE SUMMARY  Eric [name]  MR#: [id number]  DOB:01/07/1924  Date of Admission: 04/19/2011 Date of Discharge: 04/24/2011  Attending Physician:Danni Leabo  Patient's [id number], Eric Clark, MD, MD  Consults:Treatment Team:  Rounding [facility]  Discharge Diagnoses: Present on Admission:  .DIABETES MELLITUS, TYPE I, CONTROLLED, WITH RETINOPATHY .HYPERLIPIDEMIA .HYPERTENSION .GERD .NSTEMI (non-ST elevated myocardial infarction) .CKD (chronic kidney disease) stage 3, GFR 30-59 ml/min .Pneumonia Nonobstructive coronary artery disease.    Current Discharge Medication List    START taking these medications   Details  amLODipine (NORVASC) 10 MG tablet Take 1 tablet (10 mg total) by mouth daily. Qty: 30 tablet, Refills: 0    moxifloxacin (AVELOX) 400 MG tablet Take 1 tablet (400 mg total) by mouth daily. Qty: 5 tablet, Refills: 0    nitroGLYCERIN (NITROSTAT) 0.4 MG SL tablet Place 1 tablet (0.4 mg total) under the tongue every 5 (five) minutes as needed for chest pain. Qty: 10 tablet, Refills: 0      CONTINUE these medications which have CHANGED   Details  carvedilol (COREG) 12.5 MG tablet Take 1 tablet (12.5 mg total) by mouth 2 (two) times daily with a meal. Qty: 60 tablet, Refills: 0    furosemide (LASIX) 80 MG tablet Take 0.5 tablets (40 mg total) by mouth daily. Qty: 30 tablet, Refills: 0      CONTINUE these medications which have NOT CHANGED   Details  acetaminophen (TYLENOL) 500 MG tablet Take 1,000 mg by mouth every 6 (six) hours as needed. For pain.     atorvastatin (LIPITOR) 20 MG tablet Take 20 mg by mouth daily.      citalopram (CELEXA) 20 MG tablet Take 20 mg by mouth daily.      clotrimazole (LOTRIMIN) 1 % cream Apply 1 application topically 2 (two) times daily. For irritation.    insulin NPH (HUMULIN N) 100 UNIT/ML injection Inject 18 Units into the skin 2 (two) times daily before a meal. Qty: 20 mL, Refills: 11    lactulose (CHRONULAC) 10  GM/15ML solution Take 15 ml by mouth twice a day as needed     losartan (COZAAR) 100 MG tablet Take 100 mg by mouth daily.      promethazine-codeine (PHENERGAN WITH CODEINE) 6.25-10 MG/5ML syrup Take 5 mLs by mouth every 4 (four) hours as needed. For cough.     RABEprazole (ACIPHEX) 20 MG tablet Take 20 mg by mouth daily.      aspirin 81 MG tablet Take 81 mg by mouth daily.      BAYER CONTOUR TEST test strip USE TWICE DAILY Qty: 100 strip, Refills: 4    Insulin Syringe-Needle U-100 31G X 5/16" 0.3 ML MISC Use as directed twice a day Qty: 60 each, Refills: 5      STOP taking these medications     aspirin EC 81 MG tablet      azithromycin (ZITHROMAX) 250 MG tablet      lisinopril-hydrochlorothiazide (PRINZIDE,ZESTORETIC) 20-12.5 MG per tablet           Hospital Course: Mr Eric [name] is a pleasant gentleman admitted on 04/19/11 with generalised weakness, found to have CAP/acute on chronic ckd/nstemi with cath showing nonobstructive cad(LAD 30-40%, RAC 10-20%,Left circumflex 10%), EF 55%. Cardiology recommended risk factor modification. Patient needs a few more days of antibiotic(levaquin inhouse, discharged on avelox-concerned about renal function).  His coreg was increased and he was taken off lisinopril/hctz ijn view of renal function. He will need fine tuning of his BP meds mindful of renal function. Will defer  this to Dr Eric [name].   Day of Discharge BP 132/68  Pulse 59  Temp(Src) 97.7 F (36.5 C) (Oral)  Resp 19  Ht 5\' 10"  (1.778 m)  Wt 70.465 kg (155 lb 5.6 oz)  BMI 22.29 kg/m2  SpO2 98%  Physical Exam: At baseline.  Results for orders placed during the hospital encounter of 04/19/11 (from the past 24 hour(s))  GLUCOSE, CAPILLARY     Status: Normal   Collection Time   04/23/11  4:32 PM      Component Value Range   Glucose-Capillary 73  70 - 99 (mg/dL)  GLUCOSE, CAPILLARY     Status: Normal   Collection Time   04/23/11  6:03 PM      Component Value Range    Glucose-Capillary 75  70 - 99 (mg/dL)  GLUCOSE, CAPILLARY     Status: Abnormal   Collection Time   04/23/11  8:38 PM      Component Value Range   Glucose-Capillary 244 (*) 70 - 99 (mg/dL)   Comment 1 Documented in Chart     Comment 2 Notify RN    GLUCOSE, CAPILLARY     Status: Abnormal   Collection Time   04/24/11  7:56 AM      Component Value Range   Glucose-Capillary 152 (*) 70 - 99 (mg/dL)  GLUCOSE, CAPILLARY     Status: Abnormal   Collection Time   04/24/11 11:45 AM      Component Value Range   Glucose-Capillary 234 (*) 70 - 99 (mg/dL)    Disposition: home today.   Follow-up Appts: Discharge Orders    Future Appointments: Provider: Department: Dept Phone: Center:   04/29/2011 9:00 AM Renato Shin, MD [facility] [phone] [facility]     Future Orders Please Complete By Expires   Diet - low sodium heart healthy      Diet Carb Modified      Increase activity slowly         Follow-up with Dr. Loanne [name] as scheduled.   Tests Needing Follow-up: Bmp.  Time spent in discharge (includes decision making & examination of pt): 35 minutes.  Signed: Tauriel [name] 04/24/2011, 3:36 PM

## 2011-04-29 ENCOUNTER — Other Ambulatory Visit: Payer: Medicare Other

## 2011-04-29 ENCOUNTER — Ambulatory Visit (INDEPENDENT_AMBULATORY_CARE_PROVIDER_SITE_OTHER): Payer: Medicare Other | Admitting: Endocrinology

## 2011-04-29 ENCOUNTER — Encounter: Payer: Self-pay | Admitting: Endocrinology

## 2011-04-29 ENCOUNTER — Ambulatory Visit (INDEPENDENT_AMBULATORY_CARE_PROVIDER_SITE_OTHER)
Admission: RE | Admit: 2011-04-29 | Discharge: 2011-04-29 | Disposition: A | Discharge status: Home / Self Care | Payer: Medicare Other | Source: Ambulatory Visit | Attending: Endocrinology | Admitting: Endocrinology

## 2011-04-29 VITALS — BP 132/82 | HR 62 | Temp 96.4°F | Ht 70.0 in | Wt 153.8 lb

## 2011-04-29 DIAGNOSIS — J189 Pneumonia, unspecified organism: Secondary | ICD-10-CM

## 2011-04-29 DIAGNOSIS — D61818 Other pancytopenia: Secondary | ICD-10-CM

## 2011-04-29 MED ORDER — LOSARTAN POTASSIUM 100 MG PO TABS: 100.0000 mg | ORAL_TABLET | Freq: Every day | ORAL | Status: DC

## 2011-04-29 MED ORDER — OMEPRAZOLE 40 MG PO CPDR: 40.0000 mg | DELAYED_RELEASE_CAPSULE | Freq: Every day | ORAL | Status: DC

## 2011-04-29 NOTE — Progress Notes (Signed)
Subjective:    Patient ID: Eric [name], male    DOB: October 24, 1923, 78 y.o.   MRN: [id number]  HPI here for regular wellness examination.  He's feeling pretty well in general, and says chronic med probs are stable, except as noted below. Past Medical History  Diagnosis Date  . ANEMIA-NOS 02/17/2007  . DEPRESSION 12/25/2008  . HYPERTENSION 02/17/2007  . GERD 02/17/2007  . OSTEOARTHRITIS 03/04/2010  . DIABETES MELLITUS, TYPE I, CONTROLLED, WITH RETINOPATHY   . Gastroparesis   . BENIGN PROSTATIC HYPERTROPHY   . VITAMIN B12 DEFICIENCY   . BACK PAIN, LUMBAR   . LIVER DISORDER   . PSA, INCREASED     Past Surgical History  Procedure Date  . Transurethral resection of prostate     History   Social History  . Marital Status: Widowed    Spouse Name: N/A    Number of Children: N/A  . Years of Education: N/A   Occupational History  . Not on file.   Social History Main Topics  . Smoking status: Never Smoker   . Smokeless tobacco: Not on file  . Alcohol Use: No  . Drug Use: No  . Sexually Active: No   Other Topics Concern  . 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 acetaminophen (TYLENOL) 500 MG tablet Take 1,000 mg by mouth every 6 (six) hours as needed. For pain.       Marland Kitchen amLODipine (NORVASC) 10 MG tablet Take 1 tablet (10 mg total) by mouth daily.  30 tablet  0  . aspirin 81 MG tablet Take 81 mg by mouth daily.        Marland Kitchen atorvastatin (LIPITOR) 20 MG tablet Take 20 mg by mouth daily.        Marland Kitchen BAYER CONTOUR TEST test strip USE TWICE DAILY  100 strip  4  . carvedilol (COREG) 12.5 MG tablet Take 1 tablet (12.5 mg total) by mouth 2 (two) times daily with a meal.  60 tablet  0  . citalopram (CELEXA) 20 MG tablet Take 20 mg by mouth daily.        . clotrimazole (LOTRIMIN) 1 % cream Apply 1 application topically 2 (two) times  daily. For irritation.      . furosemide (LASIX) 80 MG tablet Take 0.5 tablets (40 mg total) by mouth daily.  30 tablet  0  . insulin NPH (HUMULIN N) 100 UNIT/ML injection Inject 18 Units into the skin 2 (two) times daily before a meal.  20 mL  11  . Insulin Syringe-Needle U-100 31G X 5/16" 0.3 ML MISC Use as directed twice a day  60 each  5  . lactulose (CHRONULAC) 10 GM/15ML solution Take 15 ml by mouth twice a day as needed       . losartan (COZAAR) 100 MG tablet Take 100 mg by mouth daily.        . nitroGLYCERIN (NITROSTAT) 0.4 MG SL tablet Place 1 tablet (0.4 mg total) under the tongue every 5 (five) minutes as needed for chest pain.  10 tablet  0  . RABEprazole (ACIPHEX) 20 MG tablet Take 20 mg by mouth daily.        Marland Kitchen moxifloxacin (AVELOX) 400 MG tablet Take 1 tablet (400 mg total) by mouth daily.  5 tablet  0  . promethazine-codeine (PHENERGAN WITH CODEINE) 6.25-10 MG/5ML  syrup Take 5 mLs by mouth every 4 (four) hours as needed. For cough.         No Known Allergies  Family History  Problem Relation Age of Onset  . Cancer Father     had uncertain type of cancer  . Cancer Brother     Prostate Cancer  . Cancer Brother     Prostate Cancer    BP 132/82  Pulse 62  Temp(Src) 96.4 F (35.8 C) (Oral)  Ht 5\' 10"  (1.778 m)  Wt 153 lb 12.8 oz (69.763 kg)  BMI 22.07 kg/m2  SpO2 96%     Review of Systems  Constitutional: Negative for unexpected weight change.  HENT: Negative for ear discharge.   Eyes: Negative for visual disturbance.  Respiratory: Negative for shortness of breath.   Cardiovascular: Negative for chest pain.  Gastrointestinal: Negative for anal bleeding.  Genitourinary: Negative for hematuria.  Musculoskeletal: Negative for gait problem.  Skin: Negative for rash.  Neurological: Negative for syncope.  Hematological: Bruises/bleeds easily.  Psychiatric/Behavioral: Negative for sleep disturbance.       Objective:   Physical Exam VS: see vs page GEN: no  distress HEAD: head: no deformity eyes: no periorbital swelling, no proptosis external nose and ears are normal mouth: no lesion seen NECK: supple, thyroid is not enlarged CHEST WALL: no deformity BREASTS:  No gynecomastia CV: reg rate and rhythm, no murmur ABD: abdomen is soft, nontender.  no hepatosplenomegaly.  not distended.  no hernia GENITALIA/RECTAL/PROSTATE:  sees urology MUSCULOSKELETAL: muscle bulk and strength are grossly normal.  no obvious joint swelling.  gait is normal and steady EXTEMITIES: no deformity.  no ulcer on the feet.  feet are of normal color and temp.  1+ bilat leg edema.  There is bilateral onychomycosis. PULSES: dorsalis pedis intact bilat.  no carotid bruit NEURO:  cn 2-12 grossly intact.   readily moves all 4's.  sensation is intact to touch on the feet SKIN:  Normal texture and temperature.  No rash or suspicious lesion is visible.   NODES:  None palpable at the neck PSYCH: alert, oriented x3.  Does not appear anxious nor depressed.       Assessment & Plan:  Wellness visit today, with problems stable, except as noted.    SEPARATE EVALUATION FOLLOWS--EACH PROBLEM HERE IS NEW, NOT RESPONDING TO TREATMENT, OR POSES SIGNIFICANT RISK TO THE PATIENT'S HEALTH: HISTORY OF THE PRESENT ILLNESS: Pt was recently rx'ed with avelox.  She says cough is improved but not resolved. He has few years of slight leg cramps, but no assoc numbness Pt says aciphex controls gerd well, but it is expensive PAST MEDICAL HISTORY reviewed and up to date today REVIEW OF SYSTEMS: Denies fever and denies hypoglycemia (other than in the hospital).   PHYSICAL EXAMINATION: VITAL SIGNS:  See vs page GENERAL: no distress LUNGS:  Clear to auscultation LABS/CXR Cxr: improved Lab Results  Component Value Date   WBC 5.0 04/23/2011   HGB 10.5* 04/23/2011   HCT 29.5* 04/23/2011   PLT 208 04/23/2011   GLUCOSE 52* 04/23/2011   CHOL 108 04/20/2011   TRIG 43 04/20/2011   HDL 38* 04/20/2011    LDLDIRECT 40.5 10/28/2006   LDLCALC 61 04/20/2011   ALT 29 04/22/2011   AST 40* 04/22/2011   NA 132* 04/23/2011   K 3.3* 04/23/2011   CL 104 04/23/2011   CREATININE 1.36* 04/23/2011   BUN 20 04/23/2011   CO2 21 04/23/2011   TSH 1.514 04/19/2011   PSA  3.32 03/30/2011   INR 1.02 04/19/2011   HGBA1C 10.1* 03/30/2011   MICROALBUR 16.8* 03/30/2011   IMPRESSION: Pneumonia, improved.  However, avelox interacts with celexa Jerrye Bushy, well-controlled, but aciphex is expensive Cramps, uncertain etiology PLAN: See instruction page

## 2011-04-29 NOTE — Patient Instructions (Addendum)
blood tests and a chest-x-ray are being requested for you today.  please call [phone] to hear your test results.  You will be prompted to enter the 9-digit "MRN" number that appears at the top left of this page, followed by #.  Then you will hear the message. You should start getting vitamin b-12 injections, once a month. please consider these measures for your health:  minimize alcohol.  do not use tobacco products.  have a colonoscopy at least every 10 years from age 27.  keep firearms safely stored.  always use seat belts.  have working smoke alarms in your home.  see an eye doctor and dentist regularly.  never drive under the influence of alcohol or drugs (including prescription drugs). please let me know what your wishes would be, if artificial life support measures should become necessary.  it is critically important to prevent falling down (keep floor areas well-lit, dry, and free of loose objects.  If you have a cane, walker, or wheelchair, you should use it, even for short trips around the house.  Also, try not to rush). Please come back for a follow-up appointment in 3 months.  i have sent a prescription to your pharmacy, to change the aciphex to omeprazole (cheaper).   Because the avelox interacts with citalopram, please hold-off for now.   (update: i left message on phone-tree:  rx as we discussed).

## 2011-04-30 LAB — PTH, INTACT AND CALCIUM: Calcium, Total (PTH): 8.8 mg/dL (ref 8.4–10.5)

## 2011-05-24 ENCOUNTER — Other Ambulatory Visit (HOSPITAL_COMMUNITY): Payer: Self-pay | Admitting: Internal Medicine

## 2011-05-30 ENCOUNTER — Emergency Department (INDEPENDENT_AMBULATORY_CARE_PROVIDER_SITE_OTHER)
Admission: EM | Admit: 2011-05-30 | Discharge: 2011-05-30 | Disposition: A | Discharge status: Home / Self Care | Payer: Medicare Other | Source: Home / Self Care | Attending: Family Medicine | Admitting: Family Medicine

## 2011-05-30 ENCOUNTER — Encounter (HOSPITAL_COMMUNITY): Payer: Self-pay

## 2011-05-30 DIAGNOSIS — I10 Essential (primary) hypertension: Secondary | ICD-10-CM

## 2011-05-30 DIAGNOSIS — E119 Type 2 diabetes mellitus without complications: Secondary | ICD-10-CM

## 2011-05-30 LAB — POCT I-STAT, CHEM 8
BUN: 22 mg/dL (ref 6–23)
Calcium, Ion: 1.2 mmol/L (ref 1.12–1.32)
Creatinine, Ser: 1.2 mg/dL (ref 0.50–1.35)
Hemoglobin: 12.9 g/dL — ABNORMAL LOW (ref 13.0–17.0)
Sodium: 134 mEq/L — ABNORMAL LOW (ref 135–145)
TCO2: 26 mmol/L (ref 0–100)

## 2011-05-30 NOTE — ED Provider Notes (Signed)
History     CSN: [id number]  Arrival date & time 05/30/11  1655   First MD Initiated Contact with Patient 05/30/11 1708      Chief Complaint  Patient presents with  . Hyperglycemia    high blood sugar and high blood pressure    (Consider location/radiation/quality/duration/timing/severity/associated sxs/prior treatment) Patient is a 78 y.o. male presenting with hypertension. The history is provided by the patient.  Hypertension This is a chronic problem. The current episode started 6 to 12 hours ago (found bs and bp to be elevated today.). The problem has not changed since onset.Pertinent negatives include no chest pain, no abdominal pain, no headaches and no shortness of breath.    Past Medical History  Diagnosis Date  . ANEMIA-NOS 02/17/2007  . DEPRESSION 12/25/2008  . HYPERTENSION 02/17/2007  . GERD 02/17/2007  . OSTEOARTHRITIS 03/04/2010  . DIABETES MELLITUS, TYPE I, CONTROLLED, WITH RETINOPATHY   . Gastroparesis   . BENIGN PROSTATIC HYPERTROPHY   . VITAMIN B12 DEFICIENCY   . BACK PAIN, LUMBAR   . LIVER DISORDER   . PSA, INCREASED     Past Surgical History  Procedure Date  . Transurethral resection of prostate     Family History  Problem Relation Age of Onset  . Cancer Father     had uncertain type of cancer  . Cancer Brother     Prostate Cancer  . Cancer Brother     Prostate Cancer    History  Substance Use Topics  . Smoking status: Never Smoker   . Smokeless tobacco: Not on file  . Alcohol Use: No      Review of Systems  Constitutional: Negative.   HENT: Negative.   Respiratory: Negative for cough and shortness of breath.   Cardiovascular: Positive for leg swelling. Negative for chest pain.  Gastrointestinal: Negative.  Negative for abdominal pain.  Neurological: Negative for dizziness and headaches.    Allergies  Review of patient's allergies indicates no known allergies.  Home Medications   Current Outpatient Rx  Name Route Sig  Dispense Refill  . ACETAMINOPHEN 500 MG PO TABS Oral Take 1,000 mg by mouth every 6 (six) hours as needed. For pain.     Marland Kitchen AMLODIPINE BESYLATE 10 MG PO TABS Oral Take 1 tablet (10 mg total) by mouth daily. 30 tablet 0  . ASPIRIN 81 MG PO TABS Oral Take 81 mg by mouth daily.      . ATORVASTATIN CALCIUM 20 MG PO TABS Oral Take 20 mg by mouth daily.      Marland Kitchen BAYER CONTOUR TEST VI STRP  USE TWICE DAILY 100 strip 4  . CARVEDILOL 12.5 MG PO TABS Oral Take 1 tablet (12.5 mg total) by mouth 2 (two) times daily with a meal. 60 tablet 0  . CITALOPRAM HYDROBROMIDE 20 MG PO TABS Oral Take 20 mg by mouth daily.      Marland Kitchen CLOTRIMAZOLE 1 % EX CREA Topical Apply 1 application topically 2 (two) times daily. For irritation.    . CYANOCOBALAMIN 1000 MCG/ML IJ SOLN Intramuscular Inject 1,000 mcg into the muscle every 30 (thirty) days.    . FUROSEMIDE 80 MG PO TABS Oral Take 0.5 tablets (40 mg total) by mouth daily. 30 tablet 0  . INSULIN ISOPHANE HUMAN 100 UNIT/ML [place] SUSP Subcutaneous Inject 18 Units into the skin 2 (two) times daily before a meal. 20 mL 11  . INSULIN SYRINGE-NEEDLE U-100 31G X 5/16" 0.3 ML MISC  Use as directed twice a day  60 each 5  . LACTULOSE 10 GM/15ML PO SOLN  Take 15 ml by mouth twice a day as needed     . LOSARTAN POTASSIUM 100 MG PO TABS Oral Take 1 tablet (100 mg total) by mouth daily. 30 tablet 11  . NITROGLYCERIN 0.4 MG SL SUBL Sublingual Place 1 tablet (0.4 mg total) under the tongue every 5 (five) minutes as needed for chest pain. 10 tablet 0  . OMEPRAZOLE 40 MG PO CPDR Oral Take 1 capsule (40 mg total) by mouth daily. 30 capsule 11  . PROMETHAZINE-CODEINE 6.25-10 MG/5ML PO SYRP Oral Take 5 mLs by mouth every 4 (four) hours as needed. For cough.       BP 199/96  Pulse 81  Temp(Src) 97.9 F (36.6 C) (Oral)  Resp 17  SpO2 96%  Physical Exam  Nursing note and vitals reviewed. Constitutional: He is oriented to person, place, and time. He appears well-developed and well-nourished.    HENT:  Head: Normocephalic.  Cardiovascular: Normal rate and regular rhythm.   Pulmonary/Chest: Effort normal and breath sounds normal.  Neurological: He is alert and oriented to person, place, and time.  Skin: Skin is warm and dry.    ED Course  Procedures (including critical care time)  Labs Reviewed  POCT I-STAT, CHEM 8 - Abnormal; Notable for the following:    Sodium 134 (*)    Glucose, Bld 408 (*)    Hemoglobin 12.9 (*)    HCT 38.0 (*)    All other components within normal limits  LAB REPORT - SCANNED   No results found.   1. Hypertension   2. Diabetes mellitus       MDM  i-stat reviewed        Pauline Good, MD 05/31/11 [phone]

## 2011-05-30 NOTE — ED Notes (Signed)
Pt states he checked his sugar at home today and his sugar was 495 and his blood pressure was 200/100. States he took his insulin as ordered and bp meds.

## 2011-06-01 ENCOUNTER — Ambulatory Visit (INDEPENDENT_AMBULATORY_CARE_PROVIDER_SITE_OTHER): Payer: Medicare Other | Admitting: Endocrinology

## 2011-06-01 ENCOUNTER — Encounter: Payer: Self-pay | Admitting: Endocrinology

## 2011-06-01 ENCOUNTER — Ambulatory Visit (INDEPENDENT_AMBULATORY_CARE_PROVIDER_SITE_OTHER)
Admission: RE | Admit: 2011-06-01 | Discharge: 2011-06-01 | Disposition: A | Discharge status: Home / Self Care | Payer: Medicare Other | Source: Ambulatory Visit | Attending: Endocrinology | Admitting: Endocrinology

## 2011-06-01 VITALS — BP 128/82 | HR 60 | Temp 96.9°F | Ht 70.0 in | Wt 155.0 lb

## 2011-06-01 DIAGNOSIS — J189 Pneumonia, unspecified organism: Secondary | ICD-10-CM

## 2011-06-01 MED ORDER — ISOSORBIDE MONONITRATE 10 MG PO TABS: 10.0000 mg | ORAL_TABLET | Freq: Two times a day (BID) | ORAL | Status: DC

## 2011-06-01 NOTE — Patient Instructions (Addendum)
please increase insulin to 22 units 2x a day (with first and last meals of the day). Please make a regular physical appointment in 1 month.  check your blood sugar 2 times a day.  vary the time of day when you check, between before the 3 meals, and at bedtime.  also check if you have symptoms of your blood sugar being too high or too low.  please keep a record of the readings and bring it to your next appointment here.  please call us sooner if your blood sugar goes below 70, or if it stays over 200.  It is very important to your health that you bring the record of your blood sugar back to your next appointment. A chest-x-ray is requested for you today.  please call [phone] to hear your test results.  You will be prompted to enter the 9-digit "MRN" number that appears at the top left of this page, followed by #.  Then you will hear the message. We should reduce the amlodipine to help the swelling of the legs.  However, before that, we need to improve your blood pressure.  i have sent a prescription to your pharmacy, for an additional blood pressure medication.   (update: i left message on phone-tree:  rx as we discussed)

## 2011-06-01 NOTE — Progress Notes (Signed)
Subjective:    Patient ID: Eric [name], male    DOB: 07-26-1923, 78 y.o.   MRN: [id number]  HPI Pt was seen in urgent care a few days ago for exac of htn.  He was also noted to have severe hyperglycemia.  He thinks it was very high due to overeating at a church event the day before.  no cbg record, but states cbg's are "high in general." Pt states 1 year of moderate swelling of the legs, but no assoc sob. Past Medical History  Diagnosis Date  . ANEMIA-NOS 02/17/2007  . DEPRESSION 12/25/2008  . HYPERTENSION 02/17/2007  . GERD 02/17/2007  . OSTEOARTHRITIS 03/04/2010  . DIABETES MELLITUS, TYPE I, CONTROLLED, WITH RETINOPATHY   . Gastroparesis   . BENIGN PROSTATIC HYPERTROPHY   . VITAMIN B12 DEFICIENCY   . BACK PAIN, LUMBAR   . LIVER DISORDER   . PSA, INCREASED     Past Surgical History  Procedure Date  . Transurethral resection of prostate     History   Social History  . Marital Status: Widowed    Spouse Name: N/A    Number of Children: N/A  . Years of Education: N/A   Occupational History  . Not on file.   Social History Main Topics  . Smoking status: Never Smoker   . Smokeless tobacco: Not on file  . Alcohol Use: No  . Drug Use: No  . Sexually Active: No   Other Topics Concern  . 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 acetaminophen (TYLENOL) 500 MG tablet Take 1,000 mg by mouth every 6 (six) hours as needed. For pain.       Marland Kitchen amLODipine (NORVASC) 10 MG tablet Take 1 tablet (10 mg total) by mouth daily.  30 tablet  0  . aspirin 81 MG tablet Take 81 mg by mouth daily.        Marland Kitchen atorvastatin (LIPITOR) 20 MG tablet Take 20 mg by mouth daily.        Marland Kitchen BAYER CONTOUR TEST test strip USE TWICE DAILY  100 strip  4  . carvedilol (COREG) 12.5 MG tablet Take 1 tablet (12.5 mg total) by mouth 2 (two) times daily with a  meal.  60 tablet  0  . citalopram (CELEXA) 20 MG tablet Take 20 mg by mouth daily.        . clotrimazole (LOTRIMIN) 1 % cream Apply 1 application topically 2 (two) times daily. For irritation.      . furosemide (LASIX) 80 MG tablet Take 0.5 tablets (40 mg total) by mouth daily.  30 tablet  0  . insulin NPH (HUMULIN N,NOVOLIN N) 100 UNIT/ML injection Inject 22 Units into the skin 2 (two) times daily before a meal.      . Insulin Syringe-Needle U-100 31G X 5/16" 0.3 ML MISC Use as directed twice a day  60 each  5  . lactulose (CHRONULAC) 10 GM/15ML solution Take 15 ml by mouth twice a day as needed       . losartan (COZAAR) 100 MG tablet Take 1 tablet (100 mg total) by mouth daily.  30 tablet  11  . nitroGLYCERIN (NITROSTAT) 0.4 MG SL tablet Place 1 tablet (0.4 mg total) under the tongue every 5 (five) minutes as needed for chest pain.  10 tablet  0  . omeprazole (PRILOSEC)  40 MG capsule Take 1 capsule (40 mg total) by mouth daily.  30 capsule  11  . cyanocobalamin (,VITAMIN B-12,) 1000 MCG/ML injection Inject 1,000 mcg into the muscle every 30 (thirty) days.        No Known Allergies  Family History  Problem Relation Age of Onset  . Cancer Father     had uncertain type of cancer  . Cancer Brother     Prostate Cancer  . Cancer Brother     Prostate Cancer    BP 128/82  Pulse 60  Temp(Src) 96.9 F (36.1 C) (Oral)  Ht 5\' 10"  (1.778 m)  Wt 155 lb (70.308 kg)  BMI 22.24 kg/m2  SpO2 98%    Review of Systems denies hypoglycemia and chest pain    Objective:   Physical Exam VITAL SIGNS:  See vs page GENERAL: no distress LUNGS:  Clear to auscultation Ext: 1+ bilat leg edema   Lab Results  Component Value Date   WBC 5.0 04/23/2011   HGB 12.9* 05/30/2011   HCT 38.0* 05/30/2011   PLT 208 04/23/2011   GLUCOSE 408* 05/30/2011   CHOL 108 04/20/2011   TRIG 43 04/20/2011   HDL 38* 04/20/2011   LDLDIRECT 40.5 10/28/2006   LDLCALC 61 04/20/2011   ALT 29 04/22/2011   AST 40* 04/22/2011   NA 134*  05/30/2011   K 3.7 05/30/2011   CL 98 05/30/2011   CREATININE 1.20 05/30/2011   BUN 22 05/30/2011   CO2 21 04/23/2011   TSH 1.514 04/19/2011   PSA 3.32 03/30/2011   INR 1.02 04/19/2011   HGBA1C 10.1* 03/30/2011   MICROALBUR 16.8* 03/30/2011      Assessment & Plan:  TN, needs increased rx Edema, due to norvasc.  We need to improve bp prior to reducing this DM, needs increased rx

## 2011-06-07 ENCOUNTER — Telehealth: Payer: Self-pay

## 2011-06-07 NOTE — Telephone Encounter (Signed)
Ok to cont isosorbide and amlodipne for now, f/u feb 18 as planned

## 2011-06-07 NOTE — Telephone Encounter (Signed)
[facility] Triage Call Report Triage Record Num: [id number] Operator: Benita Stabile Patient Name: Eric [name] Call Date & Time: 06/05/2011 4:39:43PM Patient Phone: [phone] PCP: Renato Shin Patient Gender: Male PCP Fax : [phone] Patient DOB: 01/15/1922 Practice Name[facility] Reason for Call: Caller: Anna/Sibling; PCP: Renato Shin; CB#: [phone] Vicente Males sibling calling about OV 06/01/11 given Isosorbide script, not sure if needs to continue Amlodipine. Pt doing ok 06/05/11. No complaints or problems. Per Epic record, adding Isosorbide to med list. Cont Amlodipine. Pt low on Amlodipine family to f/u 06/07/11 for refills for proper dose. Protocol(s) Used: Office Note Recommended Outcome per Protocol: Information Noted and Sent to Office Reason for Outcome: Caller information to office Care Advice: ~ 06/05/2011 4:52:01PM Page 1 of 1 [email or web address]

## 2011-06-08 ENCOUNTER — Other Ambulatory Visit: Payer: Self-pay

## 2011-06-08 MED ORDER — AMLODIPINE BESYLATE 10 MG PO TABS: 10.0000 mg | ORAL_TABLET | Freq: Every day | ORAL | Status: DC

## 2011-06-11 ENCOUNTER — Ambulatory Visit (INDEPENDENT_AMBULATORY_CARE_PROVIDER_SITE_OTHER): Payer: Medicare Other | Admitting: Endocrinology

## 2011-06-11 ENCOUNTER — Encounter: Payer: Self-pay | Admitting: Endocrinology

## 2011-06-11 VITALS — BP 142/72 | HR 70 | Temp 97.9°F | Ht 70.0 in | Wt 157.2 lb

## 2011-06-11 DIAGNOSIS — E538 Deficiency of other specified B group vitamins: Secondary | ICD-10-CM

## 2011-06-11 MED ORDER — AMLODIPINE BESYLATE 5 MG PO TABS: 5.0000 mg | ORAL_TABLET | Freq: Every day | ORAL | Status: DC

## 2011-06-11 MED ORDER — CYANOCOBALAMIN 1000 MCG/ML IJ SOLN
1000.0000 ug | Freq: Once | INTRAMUSCULAR | Status: AC
  Administered 2011-06-11: 1000 ug via INTRAMUSCULAR

## 2011-06-11 MED ORDER — INSULIN NPH ISOPHANE & REGULAR (70-30) 100 UNIT/ML ~~LOC~~ SUSP: SUBCUTANEOUS | Status: DC

## 2011-06-11 MED ORDER — HYDRALAZINE HCL 10 MG PO TABS: 10.0000 mg | ORAL_TABLET | Freq: Three times a day (TID) | ORAL | Status: DC

## 2011-06-11 NOTE — Progress Notes (Signed)
Subjective:    Patient ID: Eric [name], male    DOB: 1923/08/14, 78 y.o.   MRN: [id number]  HPI Pt returns for f/u of insulin-requiring DM (1995).  he brings a record of his cbg's which i have reviewed today.  It varies from 100-400.  It is highest at hs, and at lunch.   Pt stares few mos of slight swelling of the legs, but no assoc sob. Past Medical History  Diagnosis Date  . ANEMIA-NOS 02/17/2007  . DEPRESSION 12/25/2008  . HYPERTENSION 02/17/2007  . GERD 02/17/2007  . OSTEOARTHRITIS 03/04/2010  . DIABETES MELLITUS, TYPE I, CONTROLLED, WITH RETINOPATHY   . Gastroparesis   . BENIGN PROSTATIC HYPERTROPHY   . VITAMIN B12 DEFICIENCY   . BACK PAIN, LUMBAR   . LIVER DISORDER   . PSA, INCREASED     Past Surgical History  Procedure Date  . Transurethral resection of prostate     History   Social History  . Marital Status: Widowed    Spouse Name: N/A    Number of Children: N/A  . Years of Education: N/A   Occupational History  . Not on file.   Social History Main Topics  . Smoking status: Never Smoker   . Smokeless tobacco: Not on file  . Alcohol Use: No  . Drug Use: No  . Sexually Active: No   Other Topics Concern  . 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 acetaminophen (TYLENOL) 500 MG tablet Take 1,000 mg by mouth every 6 (six) hours as needed. For pain.       Marland Kitchen aspirin 81 MG tablet Take 81 mg by mouth daily.        Marland Kitchen atorvastatin (LIPITOR) 20 MG tablet Take 20 mg by mouth daily.        Marland Kitchen BAYER CONTOUR TEST test strip USE TWICE DAILY  100 strip  4  . carvedilol (COREG) 12.5 MG tablet Take 1 tablet (12.5 mg total) by mouth 2 (two) times daily with a meal.  60 tablet  0  . citalopram (CELEXA) 20 MG tablet Take 20 mg by mouth daily.        . clotrimazole (LOTRIMIN) 1 % cream Apply 1 application topically 2 (two)  times daily. For irritation.      . furosemide (LASIX) 80 MG tablet Take 0.5 tablets (40 mg total) by mouth daily.  30 tablet  0  . Insulin Syringe-Needle U-100 31G X 5/16" 0.3 ML MISC Use as directed twice a day  60 each  5  . lactulose (CHRONULAC) 10 GM/15ML solution Take 15 ml by mouth twice a day as needed       . losartan (COZAAR) 100 MG tablet Take 1 tablet (100 mg total) by mouth daily.  30 tablet  11  . nitroGLYCERIN (NITROSTAT) 0.4 MG SL tablet Place 1 tablet (0.4 mg total) under the tongue every 5 (five) minutes as needed for chest pain.  10 tablet  0  . omeprazole (PRILOSEC) 40 MG capsule Take 1 capsule (40 mg total) by mouth daily.  30 capsule  11  . isosorbide mononitrate (ISMO,MONOKET) 10 MG tablet Take 1 tablet (10 mg total) by mouth 2 (two) times daily.  60 tablet  11    No Known Allergies  Family History  Problem Relation Age of Onset  . Cancer Father  had uncertain type of cancer  . Cancer Brother     Prostate Cancer  . Cancer Brother     Prostate Cancer    BP 142/72  Pulse 70  Temp(Src) 97.9 F (36.6 C) (Oral)  Ht 5\' 10"  (1.778 m)  Wt 157 lb 3.2 oz (71.305 kg)  BMI 22.56 kg/m2  SpO2 98%   Review of Systems Denies cough and sob    Objective:   Physical Exam VITAL SIGNS:  See vs page GENERAL: no distress LUNGS:  Clear to auscultation Ext: 1+ bilat leg edema      Assessment & Plan:  Edema, prob due to norvasc HTN, needs increased rx DM.  The pattern of his cbg's indicates he needs some adjustment in his therapy.  therapy limited by pt's need for a simple, inexpensive regimen

## 2011-06-11 NOTE — Patient Instructions (Addendum)
Add hydralazine 10 mg 3x a day. Reduce amlodipine to 5 mg daily. his surgical risk is low and outweighed by the potential benefit of the surgery.  he is therefore medically cleared. Please come back for a follow-up appointment for 1 month.   Change nph insulin to 70/30 insulin, 22 units 2x a day (with first and last meals of the day).  The night before surgery, take only 15 units.  After your surgery, the day of surgery, take only 15 units with your first and last meals of the day.

## 2011-06-25 ENCOUNTER — Other Ambulatory Visit (HOSPITAL_COMMUNITY): Payer: Self-pay | Admitting: Internal Medicine

## 2011-06-25 ENCOUNTER — Other Ambulatory Visit: Payer: Self-pay | Admitting: Endocrinology

## 2011-07-05 ENCOUNTER — Other Ambulatory Visit: Payer: Self-pay | Admitting: Endocrinology

## 2011-07-29 ENCOUNTER — Encounter: Payer: Self-pay | Admitting: Endocrinology

## 2011-07-29 ENCOUNTER — Ambulatory Visit (INDEPENDENT_AMBULATORY_CARE_PROVIDER_SITE_OTHER): Payer: Medicare Other | Admitting: Endocrinology

## 2011-07-29 VITALS — BP 140/84 | HR 66 | Temp 97.8°F | Ht 70.0 in | Wt 158.1 lb

## 2011-07-29 DIAGNOSIS — E538 Deficiency of other specified B group vitamins: Secondary | ICD-10-CM

## 2011-07-29 DIAGNOSIS — E1039 Type 1 diabetes mellitus with other diabetic ophthalmic complication: Secondary | ICD-10-CM

## 2011-07-29 DIAGNOSIS — H579 Unspecified disorder of eye and adnexa: Secondary | ICD-10-CM

## 2011-07-29 MED ORDER — HYDRALAZINE HCL 25 MG PO TABS: 25.0000 mg | ORAL_TABLET | Freq: Three times a day (TID) | ORAL | Status: DC

## 2011-07-29 MED ORDER — CYANOCOBALAMIN 1000 MCG/ML IJ SOLN
1000.0000 ug | Freq: Once | INTRAMUSCULAR | Status: AC
  Administered 2011-07-29: 1000 ug via INTRAMUSCULAR

## 2011-07-29 NOTE — Patient Instructions (Addendum)
increase hydralazine 25 mg 3x a day. Stop amlodipine. Please come back for a follow-up appointment in 1 month.   Take 70/30 insulin, 2x a day (18 units with breakfast, and 26 units with the evening meal).    Here are some samples of "nasonex."  Take 1 spray each side, daily.  This may take 5-7 days to work.Call if this helps, so i can prescribe you a similar generic. Loratadine (non-prescription) will help your runny nose.

## 2011-07-29 NOTE — Progress Notes (Signed)
Subjective:    Patient ID: Eric [name], male    DOB: 1924-01-29, 78 y.o.   MRN: [id number]  HPI Pt states few mos of moderate swelling at the legs, and assoc numbness of the hands.   Pt returns for f/u of insulin-requiring DM (1995).  he brings a record of his cbg's which i have reviewed today.  It varies from 70-400.  It is highest at in am.  Past Medical History  Diagnosis Date  . ANEMIA-NOS 02/17/2007  . DEPRESSION 12/25/2008  . HYPERTENSION 02/17/2007  . GERD 02/17/2007  . OSTEOARTHRITIS 03/04/2010  . DIABETES MELLITUS, TYPE I, CONTROLLED, WITH RETINOPATHY   . Gastroparesis   . BENIGN PROSTATIC HYPERTROPHY   . VITAMIN B12 DEFICIENCY   . BACK PAIN, LUMBAR   . LIVER DISORDER   . PSA, INCREASED     Past Surgical History  Procedure Date  . Transurethral resection of prostate     History   Social History  . Marital Status: Widowed    Spouse Name: N/A    Number of Children: N/A  . Years of Education: N/A   Occupational History  . Not on file.   Social History Main Topics  . Smoking status: Never Smoker   . Smokeless tobacco: Not on file  . Alcohol Use: No  . Drug Use: No  . Sexually Active: No   Other Topics Concern  . 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 acetaminophen (TYLENOL) 500 MG tablet Take 1,000 mg by mouth every 6 (six) hours as needed. For pain.       Marland Kitchen aspirin 81 MG tablet Take 81 mg by mouth daily.        Marland Kitchen atorvastatin (LIPITOR) 20 MG tablet Take 20 mg by mouth daily.        Marland Kitchen BAYER CONTOUR TEST test strip USE TWICE DAILY  100 strip  4  . BAYER CONTOUR TEST test strip USE TO TEST TWICE DAILY  100 strip  2  . carvedilol (COREG) 12.5 MG tablet Take 1 tablet (12.5 mg total) by mouth 2 (two) times daily with a meal.  60 tablet  0  . citalopram (CELEXA) 20 MG tablet Take 20 mg by mouth daily.         . clotrimazole (LOTRIMIN) 1 % cream Apply 1 application topically 2 (two) times daily. For irritation.      . cyanocobalamin (,VITAMIN B-12,) 1000 MCG/ML injection Inject 1,000 mcg into the muscle every 30 (thirty) days.      . furosemide (LASIX) 80 MG tablet Take 0.5 tablets (40 mg total) by mouth daily.  30 tablet  0  . insulin NPH-insulin regular (NOVOLIN 70/30) (70-30) 100 UNIT/ML injection 18 units with breakfast, and 26 units with the evening meal      . Insulin Syringe-Needle U-100 31G X 5/16" 0.3 ML MISC Use as directed twice a day  60 each  5  . isosorbide mononitrate (ISMO,MONOKET) 10 MG tablet Take 1 tablet (10 mg total) by mouth 2 (two) times daily.  60 tablet  11  . lactulose (CHRONULAC) 10 GM/15ML solution Take 15 ml by mouth twice a day as needed       . losartan (COZAAR) 100 MG tablet Take 1 tablet (100 mg total) by mouth daily.  30 tablet  11  . nitroGLYCERIN (NITROSTAT) 0.4 MG  SL tablet Place 1 tablet (0.4 mg total) under the tongue every 5 (five) minutes as needed for chest pain.  10 tablet  0  . omeprazole (PRILOSEC) 40 MG capsule Take 1 capsule (40 mg total) by mouth daily.  30 capsule  11    No Known Allergies  Family History  Problem Relation Age of Onset  . Cancer Father     had uncertain type of cancer  . Cancer Brother     Prostate Cancer  . Cancer Brother     Prostate Cancer    BP 140/84  Pulse 66  Temp(Src) 97.8 F (36.6 C) (Oral)  Ht 5\' 10"  (1.778 m)  Wt 158 lb 1.9 oz (71.723 kg)  BMI 22.69 kg/m2  SpO2 97%   Review of Systems Denies sob, but he has rhinorrhea    Objective:   Physical Exam VITAL SIGNS:  See vs page GENERAL: no distress Ext: 2+ edema of the legs.  Hands are normal Neuro: sensation is intact to touch on the hands.     Assessment & Plan:  Dm, The pattern of his cbg's indicates he needs some adjustment in his therapy Edema, persistent HTN, needs increased rx

## 2011-08-02 ENCOUNTER — Other Ambulatory Visit: Payer: Self-pay | Admitting: Endocrinology

## 2011-08-09 ENCOUNTER — Telehealth: Payer: Self-pay | Admitting: *Deleted

## 2011-08-09 NOTE — Telephone Encounter (Signed)
Caller requesting call back from nurse; did not give reason concerning patient/SLS

## 2011-08-10 NOTE — Telephone Encounter (Signed)
Left message for pt to callback office.  

## 2011-08-11 NOTE — Telephone Encounter (Signed)
Pt called about medications that he needs refills on. Pt will callback with names of medications he needs refilled.

## 2011-08-12 NOTE — Telephone Encounter (Signed)
Left message for pt to callback with names of medications he needs refilled or to contact pharmacy for refills.

## 2011-09-02 ENCOUNTER — Ambulatory Visit (INDEPENDENT_AMBULATORY_CARE_PROVIDER_SITE_OTHER): Payer: Medicare Other | Admitting: Endocrinology

## 2011-09-02 DIAGNOSIS — E1065 Type 1 diabetes mellitus with hyperglycemia: Secondary | ICD-10-CM

## 2011-09-02 DIAGNOSIS — H579 Unspecified disorder of eye and adnexa: Secondary | ICD-10-CM

## 2011-09-02 DIAGNOSIS — Z0289 Encounter for other administrative examinations: Secondary | ICD-10-CM

## 2011-09-03 ENCOUNTER — Other Ambulatory Visit: Payer: Self-pay | Admitting: Endocrinology

## 2011-09-03 MED ORDER — CLOTRIMAZOLE 1 % EX CREA: 1.0000 "application " | TOPICAL_CREAM | Freq: Two times a day (BID) | CUTANEOUS | Status: DC

## 2011-09-03 NOTE — Telephone Encounter (Signed)
Pt requesting creme med--pt don't know med name/or what it is use for

## 2011-09-03 NOTE — Telephone Encounter (Signed)
Rx sent to pharmacy, called to inform pt, no answer/unable to leave message.

## 2011-09-03 NOTE — Telephone Encounter (Signed)
Please refill clotrimazole prn

## 2011-09-05 NOTE — Progress Notes (Signed)
  Subjective:    Patient ID: Eric [name], male    DOB: [birth year], 78 y.o.   MRN: [id number]  HPI    Review of Systems     Objective:   Physical Exam        Assessment & Plan:

## 2011-09-06 NOTE — Telephone Encounter (Signed)
Left message informing pt rx was sent to pharmacy and to callback office with any questions/concerns.

## 2011-09-28 ENCOUNTER — Other Ambulatory Visit: Payer: Self-pay | Admitting: *Deleted

## 2011-09-28 MED ORDER — LOSARTAN POTASSIUM 100 MG PO TABS: 100.0000 mg | ORAL_TABLET | Freq: Every day | ORAL | Status: DC

## 2011-09-28 MED ORDER — ATORVASTATIN CALCIUM 20 MG PO TABS: 20.0000 mg | ORAL_TABLET | Freq: Every day | ORAL | Status: DC

## 2011-09-28 MED ORDER — ISOSORBIDE MONONITRATE 10 MG PO TABS: 10.0000 mg | ORAL_TABLET | Freq: Two times a day (BID) | ORAL | Status: DC

## 2011-09-28 MED ORDER — OMEPRAZOLE 40 MG PO CPDR: 40.0000 mg | DELAYED_RELEASE_CAPSULE | Freq: Every day | ORAL | Status: DC

## 2011-09-28 MED ORDER — CITALOPRAM HYDROBROMIDE 20 MG PO TABS: ORAL_TABLET | ORAL | Status: DC

## 2011-09-28 NOTE — Telephone Encounter (Signed)
R'cd fax from [place] for refill of Lipitor, Omeprazole, and Losartan for 90 day supply refills.

## 2011-09-28 NOTE — Telephone Encounter (Signed)
R'cd fax from [place] for refills of Citalopram and Imdur for 90 day supply.

## 2011-09-30 ENCOUNTER — Telehealth: Payer: Self-pay | Admitting: Endocrinology

## 2011-09-30 NOTE — Telephone Encounter (Signed)
Pt informed of MD's advisement-transferred to scheduler to set up appointment for tomorrow morning.

## 2011-09-30 NOTE — Telephone Encounter (Signed)
[facility] Ov with me tomorrow am (not pm please) er

## 2011-09-30 NOTE — Telephone Encounter (Signed)
Caller: [facility]/Patient; PCP: Renato Shin; CB#: [phone];  Call regarding Patients Feet Are So Swollen He Cant Walk and Needs To Be Seen; Reports edema bilateral feet to knees. Unable to put in shoes.   Onset 09/28/11. Fell twice on 09/28/11 while working d/t knee gave out. Unable to wear pressure stockings d/t too tight and too hard to remove.  Advised to see MD now for new onset or worsening shortness of breath or difficulty breathing per Atraumatic Edema Guideline.  No appts remain for 09/30/11; message sent to [facility] for call back to pt.

## 2011-10-01 ENCOUNTER — Encounter: Payer: Self-pay | Admitting: Endocrinology

## 2011-10-01 ENCOUNTER — Ambulatory Visit (INDEPENDENT_AMBULATORY_CARE_PROVIDER_SITE_OTHER): Payer: Medicare Other | Admitting: Endocrinology

## 2011-10-01 ENCOUNTER — Other Ambulatory Visit (INDEPENDENT_AMBULATORY_CARE_PROVIDER_SITE_OTHER): Payer: Medicare Other

## 2011-10-01 VITALS — BP 140/82 | HR 90 | Temp 98.6°F | Ht 70.0 in | Wt 158.0 lb

## 2011-10-01 DIAGNOSIS — R609 Edema, unspecified: Secondary | ICD-10-CM

## 2011-10-01 DIAGNOSIS — E1039 Type 1 diabetes mellitus with other diabetic ophthalmic complication: Secondary | ICD-10-CM

## 2011-10-01 DIAGNOSIS — Z794 Long term (current) use of insulin: Secondary | ICD-10-CM

## 2011-10-01 DIAGNOSIS — H579 Unspecified disorder of eye and adnexa: Secondary | ICD-10-CM

## 2011-10-01 LAB — BRAIN NATRIURETIC PEPTIDE: Pro B Natriuretic peptide (BNP): 1962 pg/mL — ABNORMAL HIGH (ref 0.0–100.0)

## 2011-10-01 LAB — HEMOGLOBIN A1C: Hgb A1c MFr Bld: 12 % — ABNORMAL HIGH (ref 4.6–6.5)

## 2011-10-01 LAB — BASIC METABOLIC PANEL
BUN: 24 mg/dL — ABNORMAL HIGH (ref 6–23)
Calcium: 7.9 mg/dL — ABNORMAL LOW (ref 8.4–10.5)
Creatinine, Ser: 1.6 mg/dL — ABNORMAL HIGH (ref 0.4–1.5)
GFR: 54.99 mL/min — ABNORMAL LOW (ref >60.00)

## 2011-10-01 NOTE — Progress Notes (Signed)
Subjective:    Patient ID: Eric [name], male    DOB: 02/27/1924, 78 y.o.   MRN: [id number]  HPI Pt states approx 1 year of moderate swelling at the ankles, and slight assoc pain. Past Medical History  Diagnosis Date  . ANEMIA-NOS 02/17/2007  . DEPRESSION 12/25/2008  . HYPERTENSION 02/17/2007  . GERD 02/17/2007  . OSTEOARTHRITIS 03/04/2010  . DIABETES MELLITUS, TYPE I, CONTROLLED, WITH RETINOPATHY   . Gastroparesis   . BENIGN PROSTATIC HYPERTROPHY   . VITAMIN B12 DEFICIENCY   . BACK PAIN, LUMBAR   . LIVER DISORDER   . PSA, INCREASED   . Hyperlipidemia   . Diabetes mellitus   . Pneumonia   . Anxiety     Past Surgical History  Procedure Date  . Transurethral resection of prostate     History   Social History  . Marital Status: Widowed    Spouse Name: N/A    Number of Children: N/A  . Years of Education: N/A   Occupational History  . Not on file.   Social History Main Topics  . Smoking status: Never Smoker   . Smokeless tobacco: Never Used  . Alcohol Use: No  . Drug Use: No  . Sexually Active: No   Other Topics Concern  . 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   Current Outpatient Prescriptions on File Prior to Visit  Medication Sig Dispense Refill  . acetaminophen (TYLENOL) 500 MG tablet Take 1,000 mg by mouth every 6 (six) hours as needed. For pain.       Marland Kitchen aspirin 81 MG tablet Take 81 mg by mouth daily.        Marland Kitchen atorvastatin (LIPITOR) 20 MG tablet Take 1 tablet (20 mg total) by mouth daily.  90 tablet  1  . BAYER CONTOUR TEST test strip USE TWICE DAILY  100 strip  4  . BAYER CONTOUR TEST test strip USE TO TEST TWICE DAILY  100 strip  2  . clotrimazole (LOTRIMIN) 1 % cream Apply 1 application topically 2 (two) times daily. For irritation.  30 g  3  . cyanocobalamin (,VITAMIN B-12,) 1000 MCG/ML injection Inject 1,000 mcg  into the muscle every 30 (thirty) days.      . furosemide (LASIX) 80 MG tablet Take 80 mg by mouth daily.      . hydrALAZINE (APRESOLINE) 25 MG tablet Take 1 tablet (25 mg total) by mouth 3 (three) times daily.  90 tablet  3  . insulin NPH-insulin regular (NOVOLIN 70/30) (70-30) 100 UNIT/ML injection Inject 18-25 Units into the skin 2 (two) times daily with a meal. 18 units with breakfast, and 25 units with the evening meal      . Insulin Syringe-Needle U-100 31G X 5/16" 0.3 ML MISC Use as directed twice a day  60 each  5  . isosorbide mononitrate (ISMO,MONOKET) 10 MG tablet Take 1 tablet (10 mg total) by mouth 2 (two) times daily.  180 tablet  1  . lactulose (CHRONULAC) 10 GM/15ML solution Take 15 ml by mouth twice a day as needed       . losartan (COZAAR) 100 MG tablet Take 1 tablet (100 mg total) by mouth daily.  90 tablet  1  . nitroGLYCERIN (NITROSTAT) 0.4 MG SL tablet Place 1 tablet (0.4 mg total) under the tongue every 5 (five) minutes as needed for chest pain.  10 tablet  0  . omeprazole (PRILOSEC) 40 MG capsule Take 1 capsule (40 mg total) by mouth daily.  90 capsule  1    No Known Allergies  Family History  Problem Relation Age of Onset  . Cancer Father     had uncertain type of cancer  . Cancer Brother     Prostate Cancer  . Cancer Brother     Prostate Cancer    BP 140/82  Pulse 90  Temp 98.6 F (37 C) (Oral)  Ht 5\' 10"  (1.778 m)  Wt 158 lb (71.668 kg)  BMI 22.67 kg/m2  SpO2 95%    Review of Systems Denies sob.  He seldom has hypoglycemia.      Objective:   Physical Exam VITAL SIGNS:  See vs page GENERAL: no distress Pulses: dorsalis pedis intact bilat, but decreased (perhaps due to edema) Feet: no deformity.  no ulcer on the feet.  feet are of normal color and temp.  2+ edema of the legs and ankles.  There is spotty hyperpigmentation of the legs. Neuro: sensation is intact to touch on the feet, but decreased from normal    Lab Results  Component Value Date    WBC 9.5 10/03/2011   HGB 12.0* 10/03/2011   HCT 33.1* 10/03/2011   PLT 239 10/03/2011   GLUCOSE 231* 10/03/2011   CHOL 108 04/20/2011   TRIG 43 04/20/2011   HDL 38* 04/20/2011   LDLDIRECT 40.5 10/28/2006   LDLCALC 61 04/20/2011   ALT 30 10/02/2011   AST 34 10/02/2011   NA 120* 10/03/2011   K 3.3* 10/03/2011   CL 87* 10/03/2011   CREATININE 1.26 10/03/2011   BUN 28* 10/03/2011   CO2 20 10/03/2011   TSH 0.892 10/03/2011   PSA 3.32 03/30/2011   INR 1.02 04/19/2011   HGBA1C 11.9* 10/02/2011   MICROALBUR 16.8* 03/30/2011  BNP is elevated    Assessment & Plan:  DM.  Very poor control, which is usually due to noncompliance Edema, ? Etiology Hypocalcemia, worse.  ? etiol

## 2011-10-01 NOTE — Patient Instructions (Addendum)
blood tests are being requested for you today.  You will receive a letter with results. Also, let's check a 24-hr urine test.  Please come back for a follow-up appointment in 3 months. It is good to wear your compression stockings.

## 2011-10-02 ENCOUNTER — Encounter (HOSPITAL_COMMUNITY): Payer: Self-pay | Admitting: *Deleted

## 2011-10-02 ENCOUNTER — Encounter (HOSPITAL_COMMUNITY): Payer: Self-pay | Admitting: Emergency Medicine

## 2011-10-02 ENCOUNTER — Inpatient Hospital Stay (HOSPITAL_COMMUNITY)
Admission: EM | Admit: 2011-10-02 | Discharge: 2011-10-28 | DRG: 291 | Disposition: A | Discharge status: Skilled Nursing Facility | Payer: Medicare Other | Attending: Internal Medicine | Admitting: Internal Medicine

## 2011-10-02 ENCOUNTER — Emergency Department (HOSPITAL_COMMUNITY)
Admission: EM | Admit: 2011-10-02 | Discharge: 2011-10-02 | Disposition: A | Discharge status: Nursing Home (Includes ICF) | Payer: Medicare Other | Source: Home / Self Care | Attending: Emergency Medicine | Admitting: Emergency Medicine

## 2011-10-02 DIAGNOSIS — R609 Edema, unspecified: Secondary | ICD-10-CM

## 2011-10-02 DIAGNOSIS — R300 Dysuria: Secondary | ICD-10-CM

## 2011-10-02 DIAGNOSIS — K3184 Gastroparesis: Secondary | ICD-10-CM

## 2011-10-02 DIAGNOSIS — R600 Localized edema: Secondary | ICD-10-CM

## 2011-10-02 DIAGNOSIS — N183 Chronic kidney disease, stage 3 unspecified: Secondary | ICD-10-CM

## 2011-10-02 DIAGNOSIS — D472 Monoclonal gammopathy: Secondary | ICD-10-CM | POA: Diagnosis not present

## 2011-10-02 DIAGNOSIS — E1165 Type 2 diabetes mellitus with hyperglycemia: Secondary | ICD-10-CM | POA: Diagnosis present

## 2011-10-02 DIAGNOSIS — E872 Acidosis: Secondary | ICD-10-CM

## 2011-10-02 DIAGNOSIS — N400 Enlarged prostate without lower urinary tract symptoms: Secondary | ICD-10-CM

## 2011-10-02 DIAGNOSIS — E1039 Type 1 diabetes mellitus with other diabetic ophthalmic complication: Secondary | ICD-10-CM

## 2011-10-02 DIAGNOSIS — M545 Low back pain, unspecified: Secondary | ICD-10-CM

## 2011-10-02 DIAGNOSIS — E860 Dehydration: Secondary | ICD-10-CM

## 2011-10-02 DIAGNOSIS — I5033 Acute on chronic diastolic (congestive) heart failure: Principal | ICD-10-CM | POA: Diagnosis present

## 2011-10-02 DIAGNOSIS — D539 Nutritional anemia, unspecified: Secondary | ICD-10-CM | POA: Diagnosis present

## 2011-10-02 DIAGNOSIS — E871 Hypo-osmolality and hyponatremia: Secondary | ICD-10-CM

## 2011-10-02 DIAGNOSIS — R109 Unspecified abdominal pain: Secondary | ICD-10-CM

## 2011-10-02 DIAGNOSIS — E538 Deficiency of other specified B group vitamins: Secondary | ICD-10-CM

## 2011-10-02 DIAGNOSIS — R05 Cough: Secondary | ICD-10-CM

## 2011-10-02 DIAGNOSIS — R972 Elevated prostate specific antigen [PSA]: Secondary | ICD-10-CM | POA: Diagnosis present

## 2011-10-02 DIAGNOSIS — I872 Venous insufficiency (chronic) (peripheral): Secondary | ICD-10-CM

## 2011-10-02 DIAGNOSIS — W19XXXA Unspecified fall, initial encounter: Secondary | ICD-10-CM

## 2011-10-02 DIAGNOSIS — F3289 Other specified depressive episodes: Secondary | ICD-10-CM

## 2011-10-02 DIAGNOSIS — I10 Essential (primary) hypertension: Secondary | ICD-10-CM

## 2011-10-02 DIAGNOSIS — G9340 Encephalopathy, unspecified: Secondary | ICD-10-CM

## 2011-10-02 DIAGNOSIS — E11319 Type 2 diabetes mellitus with unspecified diabetic retinopathy without macular edema: Secondary | ICD-10-CM | POA: Diagnosis present

## 2011-10-02 DIAGNOSIS — G9341 Metabolic encephalopathy: Secondary | ICD-10-CM

## 2011-10-02 DIAGNOSIS — R040 Epistaxis: Secondary | ICD-10-CM

## 2011-10-02 DIAGNOSIS — Z7982 Long term (current) use of aspirin: Secondary | ICD-10-CM

## 2011-10-02 DIAGNOSIS — Z66 Do not resuscitate: Secondary | ICD-10-CM | POA: Diagnosis present

## 2011-10-02 DIAGNOSIS — R1310 Dysphagia, unspecified: Secondary | ICD-10-CM

## 2011-10-02 DIAGNOSIS — D649 Anemia, unspecified: Secondary | ICD-10-CM

## 2011-10-02 DIAGNOSIS — E11649 Type 2 diabetes mellitus with hypoglycemia without coma: Secondary | ICD-10-CM

## 2011-10-02 DIAGNOSIS — J189 Pneumonia, unspecified organism: Secondary | ICD-10-CM

## 2011-10-02 DIAGNOSIS — M25529 Pain in unspecified elbow: Secondary | ICD-10-CM

## 2011-10-02 DIAGNOSIS — N179 Acute kidney failure, unspecified: Secondary | ICD-10-CM | POA: Diagnosis present

## 2011-10-02 DIAGNOSIS — R55 Syncope and collapse: Secondary | ICD-10-CM

## 2011-10-02 DIAGNOSIS — IMO0002 Reserved for concepts with insufficient information to code with codable children: Secondary | ICD-10-CM

## 2011-10-02 DIAGNOSIS — M25569 Pain in unspecified knee: Secondary | ICD-10-CM

## 2011-10-02 DIAGNOSIS — M79609 Pain in unspecified limb: Secondary | ICD-10-CM

## 2011-10-02 DIAGNOSIS — R634 Abnormal weight loss: Secondary | ICD-10-CM

## 2011-10-02 DIAGNOSIS — Z794 Long term (current) use of insulin: Secondary | ICD-10-CM

## 2011-10-02 DIAGNOSIS — R531 Weakness: Secondary | ICD-10-CM

## 2011-10-02 DIAGNOSIS — E870 Hyperosmolality and hypernatremia: Secondary | ICD-10-CM

## 2011-10-02 DIAGNOSIS — E869 Volume depletion, unspecified: Secondary | ICD-10-CM

## 2011-10-02 DIAGNOSIS — R4182 Altered mental status, unspecified: Secondary | ICD-10-CM

## 2011-10-02 DIAGNOSIS — K769 Liver disease, unspecified: Secondary | ICD-10-CM

## 2011-10-02 DIAGNOSIS — R739 Hyperglycemia, unspecified: Secondary | ICD-10-CM

## 2011-10-02 DIAGNOSIS — E8729 Other acidosis: Secondary | ICD-10-CM

## 2011-10-02 DIAGNOSIS — R9431 Abnormal electrocardiogram [ECG] [EKG]: Secondary | ICD-10-CM

## 2011-10-02 DIAGNOSIS — R627 Adult failure to thrive: Secondary | ICD-10-CM | POA: Diagnosis present

## 2011-10-02 DIAGNOSIS — M1990 Unspecified osteoarthritis, unspecified site: Secondary | ICD-10-CM

## 2011-10-02 DIAGNOSIS — I878 Other specified disorders of veins: Secondary | ICD-10-CM

## 2011-10-02 DIAGNOSIS — R7989 Other specified abnormal findings of blood chemistry: Secondary | ICD-10-CM

## 2011-10-02 DIAGNOSIS — F329 Major depressive disorder, single episode, unspecified: Secondary | ICD-10-CM

## 2011-10-02 DIAGNOSIS — J209 Acute bronchitis, unspecified: Secondary | ICD-10-CM

## 2011-10-02 DIAGNOSIS — K219 Gastro-esophageal reflux disease without esophagitis: Secondary | ICD-10-CM

## 2011-10-02 DIAGNOSIS — I509 Heart failure, unspecified: Secondary | ICD-10-CM | POA: Diagnosis present

## 2011-10-02 DIAGNOSIS — R059 Cough, unspecified: Secondary | ICD-10-CM

## 2011-10-02 DIAGNOSIS — R630 Anorexia: Secondary | ICD-10-CM

## 2011-10-02 DIAGNOSIS — J309 Allergic rhinitis, unspecified: Secondary | ICD-10-CM

## 2011-10-02 DIAGNOSIS — E1139 Type 2 diabetes mellitus with other diabetic ophthalmic complication: Secondary | ICD-10-CM | POA: Diagnosis present

## 2011-10-02 DIAGNOSIS — Z7401 Bed confinement status: Secondary | ICD-10-CM

## 2011-10-02 DIAGNOSIS — E785 Hyperlipidemia, unspecified: Secondary | ICD-10-CM

## 2011-10-02 DIAGNOSIS — I129 Hypertensive chronic kidney disease with stage 1 through stage 4 chronic kidney disease, or unspecified chronic kidney disease: Secondary | ICD-10-CM | POA: Diagnosis present

## 2011-10-02 DIAGNOSIS — G40909 Epilepsy, unspecified, not intractable, without status epilepticus: Secondary | ICD-10-CM | POA: Diagnosis present

## 2011-10-02 HISTORY — DX: Pneumonia, unspecified organism: J18.9

## 2011-10-02 HISTORY — DX: Anxiety disorder, unspecified: F41.9

## 2011-10-02 HISTORY — DX: Metabolic encephalopathy: G93.41

## 2011-10-02 HISTORY — DX: Hyperlipidemia, unspecified: E78.5

## 2011-10-02 LAB — URINALYSIS, ROUTINE W REFLEX MICROSCOPIC
Glucose, UA: >1000 mg/dL — AB
Specific Gravity, Urine: 1.022 (ref 1.005–1.030)
Urobilinogen, UA: 1 mg/dL (ref 0.0–1.0)

## 2011-10-02 LAB — POCT I-STAT TROPONIN I

## 2011-10-02 LAB — URINE MICROSCOPIC-ADD ON

## 2011-10-02 LAB — PRO B NATRIURETIC PEPTIDE: Pro B Natriuretic peptide (BNP): 396.1 pg/mL (ref 0–450)

## 2011-10-02 LAB — COMPREHENSIVE METABOLIC PANEL
ALT: 30 U/L (ref 0–53)
AST: 34 U/L (ref 0–37)
Alkaline Phosphatase: 110 U/L (ref 39–117)
CO2: 19 mEq/L (ref 19–32)
Chloride: 85 mEq/L — ABNORMAL LOW (ref 96–112)
Creatinine, Ser: 1.35 mg/dL (ref 0.50–1.35)
GFR calc non Af Amer: 46 mL/min — ABNORMAL LOW (ref >90)
Potassium: 3.7 mEq/L (ref 3.5–5.1)
Total Bilirubin: 1.2 mg/dL (ref 0.3–1.2)

## 2011-10-02 LAB — CBC
HCT: 31.6 % — ABNORMAL LOW (ref 39.0–52.0)
MCV: 89.8 fL (ref 78.0–100.0)
Platelets: 206 10*3/uL (ref 150–400)
RBC: 3.52 MIL/uL — ABNORMAL LOW (ref 4.22–5.81)
WBC: 9.2 10*3/uL (ref 4.0–10.5)

## 2011-10-02 LAB — DIFFERENTIAL
Eosinophils Relative: 0 % (ref 0–5)
Lymphocytes Relative: 5 % — ABNORMAL LOW (ref 12–46)
Lymphs Abs: 0.5 10*3/uL — ABNORMAL LOW (ref 0.7–4.0)
Neutro Abs: 8.7 10*3/uL — ABNORMAL HIGH (ref 1.7–7.7)

## 2011-10-02 LAB — GLUCOSE, CAPILLARY: Glucose-Capillary: 325 mg/dL — ABNORMAL HIGH (ref 70–99)

## 2011-10-02 LAB — LACTIC ACID, PLASMA: Lactic Acid, Venous: 2.7 mmol/L — ABNORMAL HIGH (ref 0.5–2.2)

## 2011-10-02 MED ORDER — INSULIN ASPART 100 UNIT/ML ~~LOC~~ SOLN
0.0000 [IU] | Freq: Three times a day (TID) | SUBCUTANEOUS | Status: DC
  Administered 2011-10-03: 3 [IU] via SUBCUTANEOUS
  Administered 2011-10-03: 7 [IU] via SUBCUTANEOUS
  Administered 2011-10-03: 3 [IU] via SUBCUTANEOUS
  Administered 2011-10-04 – 2011-10-05 (×3): 2 [IU] via SUBCUTANEOUS
  Administered 2011-10-05 – 2011-10-06 (×3): 1 [IU] via SUBCUTANEOUS
  Administered 2011-10-07: 5 [IU] via SUBCUTANEOUS
  Administered 2011-10-07 – 2011-10-08 (×3): 2 [IU] via SUBCUTANEOUS
  Administered 2011-10-08: 3 [IU] via SUBCUTANEOUS
  Administered 2011-10-08: 1 [IU] via SUBCUTANEOUS
  Administered 2011-10-09 – 2011-10-11 (×5): 2 [IU] via SUBCUTANEOUS
  Administered 2011-10-11 – 2011-10-12 (×2): 1 [IU] via SUBCUTANEOUS
  Administered 2011-10-12 – 2011-10-13 (×3): 2 [IU] via SUBCUTANEOUS
  Administered 2011-10-14: 3 [IU] via SUBCUTANEOUS
  Administered 2011-10-14: 1 [IU] via SUBCUTANEOUS
  Administered 2011-10-14: 7 [IU] via SUBCUTANEOUS
  Administered 2011-10-15: 1 [IU] via SUBCUTANEOUS
  Administered 2011-10-15: 5 [IU] via SUBCUTANEOUS
  Administered 2011-10-15: 2 [IU] via SUBCUTANEOUS
  Administered 2011-10-16: 3 [IU] via SUBCUTANEOUS
  Administered 2011-10-16: 2 [IU] via SUBCUTANEOUS
  Administered 2011-10-17 (×3): 3 [IU] via SUBCUTANEOUS
  Administered 2011-10-18: 5 [IU] via SUBCUTANEOUS
  Administered 2011-10-18 (×2): 3 [IU] via SUBCUTANEOUS
  Administered 2011-10-19: 5 [IU] via SUBCUTANEOUS
  Administered 2011-10-19 – 2011-10-20 (×3): 3 [IU] via SUBCUTANEOUS

## 2011-10-02 MED ORDER — CALCITRIOL 0.25 MCG PO CAPS: 0.2500 ug | ORAL_CAPSULE | Freq: Every day | ORAL | Status: DC

## 2011-10-02 NOTE — ED Notes (Addendum)
Pt with c/o swelling bilateral legs seen by dr [name] yesterday ordered 24 urine test to start tomorrow morning - per niece pt fell or had syncopal episode on Tuesday - labs completed by dr [name] - increased swelling of ankles since appt yesterday and pt not taking his medications - denies sob - lungs diminished - c/o dizziness uses cane at home - pt in wheelchair - per niece pt not eating or drinking -

## 2011-10-02 NOTE — Discharge Instructions (Signed)
We are having you go to the ER. Further testing and treatment will be dependent entirely on the ER physician who evaluates you.

## 2011-10-02 NOTE — ED Provider Notes (Signed)
History     CSN: [id number]  Arrival date & time 10/02/11  1734   First MD Initiated Contact with Patient 10/02/11 1848      Chief Complaint  Patient presents with  . Leg Swelling  . Dizziness  . Fall  . Urinary Incontinence   Mr Eric [name] has been brought in by his daughter. He was evaluated at Dr Cordelia Pen office yesterday and had blood work done. They are not aware of the results. Daughters main concerns are - edema of the feet which are now painful - refusal to elevate his feet despite multiple reminders from family - severe weakness and recent falls- - inability to take medications appropriately - loss of appetite recently.  - living alone and continuing to work He was ordered TED hose but struggled to take them off the other day for about 2 hrs and fell. No one else was present- pt cannot recall if he was unconscious  HPI  Past Medical History  Diagnosis Date  . ANEMIA-NOS 02/17/2007  . DEPRESSION 12/25/2008  . HYPERTENSION 02/17/2007  . GERD 02/17/2007  . OSTEOARTHRITIS 03/04/2010  . DIABETES MELLITUS, TYPE I, CONTROLLED, WITH RETINOPATHY   . Gastroparesis   . BENIGN PROSTATIC HYPERTROPHY   . VITAMIN B12 DEFICIENCY   . BACK PAIN, LUMBAR   . LIVER DISORDER   . PSA, INCREASED     Past Surgical History  Procedure Date  . Transurethral resection of prostate     Family History  Problem Relation Age of Onset  . Cancer Father     had uncertain type of cancer  . Cancer Brother     Prostate Cancer  . Cancer Brother     Prostate Cancer    History  Substance Use Topics  . Smoking status: Never Smoker   . Smokeless tobacco: Not on file  . Alcohol Use: No      Review of Systems  Constitutional: Positive for activity change, appetite change and fatigue. Negative for chills and diaphoresis.  Respiratory: Positive for cough. Negative for chest tightness, shortness of breath and wheezing.   Cardiovascular: Positive for leg swelling. Negative for chest pain and  palpitations.  Gastrointestinal: Negative.   Genitourinary: Positive for frequency.  Neurological: Positive for dizziness, weakness, light-headedness and numbness. Negative for seizures and speech difficulty.  Psychiatric/Behavioral: Positive for confusion and decreased concentration.    Allergies  Review of patient's allergies indicates no known allergies.  Home Medications   Current Outpatient Rx  Name Route Sig Dispense Refill  . ATORVASTATIN CALCIUM 20 MG PO TABS Oral Take 1 tablet (20 mg total) by mouth daily. 90 tablet 1  . CYANOCOBALAMIN 1000 MCG/ML IJ SOLN Intramuscular Inject 1,000 mcg into the muscle every 30 (thirty) days.    . FUROSEMIDE 80 MG PO TABS Oral Take 80 mg by mouth daily.    Marland Kitchen HYDRALAZINE HCL 25 MG PO TABS Oral Take 1 tablet (25 mg total) by mouth 3 (three) times daily. 90 tablet 3  . INSULIN ISOPHANE & REGULAR (70-30) 100 UNIT/ML [place] SUSP  26 units with breakfast, and 26 units with the evening meal    . ISOSORBIDE MONONITRATE 10 MG PO TABS Oral Take 1 tablet (10 mg total) by mouth 2 (two) times daily. 180 tablet 1  . OMEPRAZOLE 40 MG PO CPDR Oral Take 1 capsule (40 mg total) by mouth daily. 90 capsule 1  . ACETAMINOPHEN 500 MG PO TABS Oral Take 1,000 mg by mouth every 6 (six) hours as needed. For  pain.     Marland Kitchen ASPIRIN 81 MG PO TABS Oral Take 81 mg by mouth daily.      Marland Kitchen BAYER CONTOUR TEST VI STRP  USE TWICE DAILY 100 strip 4  . BAYER CONTOUR TEST VI STRP  USE TO TEST TWICE DAILY 100 strip 2  . CALCITRIOL 0.25 MCG PO CAPS Oral Take 1 capsule (0.25 mcg total) by mouth daily. 30 capsule 11  . CARVEDILOL 12.5 MG PO TABS Oral Take 1 tablet (12.5 mg total) by mouth 2 (two) times daily with a meal. 60 tablet 0  . CITALOPRAM HYDROBROMIDE 20 MG PO TABS  TAKE 1 TABLET EVERY DAY 90 tablet 1  . CLOTRIMAZOLE 1 % EX CREA Topical Apply 1 application topically 2 (two) times daily. For irritation. 30 g 3  . INSULIN SYRINGE-NEEDLE U-100 31G X 5/16" 0.3 ML MISC  Use as directed twice a  day 60 each 5  . LACTULOSE 10 GM/15ML PO SOLN  Take 15 ml by mouth twice a day as needed     . LOSARTAN POTASSIUM 100 MG PO TABS Oral Take 1 tablet (100 mg total) by mouth daily. 90 tablet 1  . NITROGLYCERIN 0.4 MG SL SUBL Sublingual Place 1 tablet (0.4 mg total) under the tongue every 5 (five) minutes as needed for chest pain. 10 tablet 0    BP 155/64  Pulse 83  Temp 98.9 F (37.2 C) (Oral)  Resp 19  SpO2 97%  Physical Exam  Constitutional: He is oriented to person, place, and time. He appears well-developed and well-nourished. No distress.  HENT:  Head: Normocephalic.  Mouth/Throat: Oropharynx is clear and moist.  Eyes: EOM are normal. Pupils are equal, round, and reactive to light. Right eye exhibits discharge. Left eye exhibits discharge. No scleral icterus.       Mild yellow discharge from both eyes  Neck: Normal range of motion. No JVD present. No tracheal deviation present.  Cardiovascular: Normal rate, regular rhythm and normal heart sounds.   No murmur heard.      Severe pitting edema up to knees  Pulmonary/Chest: Effort normal and breath sounds normal. No stridor. No respiratory distress. He has no wheezes. He has no rales.  Abdominal: Soft. Bowel sounds are normal. He exhibits no distension. There is no tenderness.  Neurological: He is alert and oriented to person, place, and time.  Skin: Skin is warm and dry. He is not diaphoretic.    ED Course  Procedures (including critical care time)  Labs Reviewed - No data to display Results for orders placed in visit on [id number]  BASIC METABOLIC PANEL      Component Value Range   Sodium 126 (*) 135 - 145 mEq/L   Potassium 4.1  3.5 - 5.1 mEq/L   Chloride 95 (*) 96 - 112 mEq/L   CO2 18 (*) 19 - 32 mEq/L   Glucose, Bld 313 (*) 70 - 99 mg/dL   BUN 24 (*) 6 - 23 mg/dL   Creatinine, Ser 1.6 (*) 0.4 - 1.5 mg/dL   Calcium 7.9 (*) 8.4 - 10.5 mg/dL   GFR 54.99 (*) >60.00 mL/min  BRAIN NATRIURETIC PEPTIDE      Component Value  Range   Pro B Natriuretic peptide (BNP) 1962.0 (*) 0.0 - 100.0 pg/mL  HEMOGLOBIN A1C      Component Value Range   Hemoglobin A1C 12.0 (*) 4.6 - 6.5 %         MDM   1. ARF  2. Hyponatremia  3. Venous stasis of lower extremity   4. Mild acidosis  5. Anorexia   6. Uncontrolled DM   I feel he is unsafe at home. He either needs someone with him 24 hrs or needs to be in a facility.  Will be sending him to ER for further evaluation of above mentioned medical issues.         Debbe Odea, MD 10/02/11 [phone]

## 2011-10-02 NOTE — ED Notes (Signed)
To ed via shuttle in wheelchair with niece

## 2011-10-02 NOTE — ED Notes (Addendum)
Patient states that he started to have bilateral feet swelling on Tuesday after wearing compression stockings.  Patient complaining of pain from knees down to toes.  Denies chest pain.

## 2011-10-02 NOTE — H&P (Signed)
PCP:   Renato Shin, MD   Chief Complaint: Bilateral pedal edema   HPI: Eric [name] is an 78 y.o. male with history of hypertension, diabetes, chronic renal insufficiency, hypertension, elevated PSA, family history of prostate cancers in both brothers, lives alone and is still actively working, presents to the urgent care clinic complaining of bilateral pedal edema along with been feeling weak. It was felt that he was unable to take care of himself adequately. He does admit to having been noncompliant with leg elevation. Evaluation showed hyponatremia with serum sodium of 122, creatinine of 1.5, bicarbonate of 19 and chloride of 89. His UA showed granular cast but no white cells, his hemoglobin is 11.4. Hospitalist was asked to admit the patient for further evaluation of his bilateral pedal edema, and for the fact that he is currently not safe to be discharged home.  Rewiew of Systems:  The patient denies, vision loss, decreased hearing, hoarseness, chest pain, syncope, dyspnea on exertion, balance deficits, hemoptysis, abdominal pain, melena, hematochezia, severe indigestion/heartburn, hematuria, incontinence, genital sores, muscle weakness, suspicious skin lesions, transient blindness, difficulty walking, depression, unusual weight change, abnormal bleeding, enlarged lymph nodes, angioedema, and breast masses.    Past Medical History  Diagnosis Date  . ANEMIA-NOS 02/17/2007  . DEPRESSION 12/25/2008  . HYPERTENSION 02/17/2007  . GERD 02/17/2007  . OSTEOARTHRITIS 03/04/2010  . DIABETES MELLITUS, TYPE I, CONTROLLED, WITH RETINOPATHY   . Gastroparesis   . BENIGN PROSTATIC HYPERTROPHY   . VITAMIN B12 DEFICIENCY   . BACK PAIN, LUMBAR   . LIVER DISORDER   . PSA, INCREASED     Past Surgical History  Procedure Date  . Transurethral resection of prostate     Medications:  HOME MEDS: Prior to Admission medications   Medication Sig Start Date End Date Taking? Authorizing Provider    acetaminophen (TYLENOL) 500 MG tablet Take 1,000 mg by mouth every 6 (six) hours as needed. For pain.    Yes Historical Provider, MD  aspirin 81 MG tablet Take 81 mg by mouth daily.     Yes Historical Provider, MD  atorvastatin (LIPITOR) 20 MG tablet Take 1 tablet (20 mg total) by mouth daily. 09/28/11  Yes Renato Shin, MD  BAYER CONTOUR TEST test strip USE TWICE DAILY 03/31/11  Yes Renato Shin, MD  BAYER CONTOUR TEST test strip USE TO TEST TWICE DAILY 06/25/11  Yes Renato Shin, MD  calcitRIOL (ROCALTROL) 0.25 MCG capsule Take 1 capsule (0.25 mcg total) by mouth daily. 10/02/11 10/01/12 Yes Renato Shin, MD  clotrimazole (LOTRIMIN) 1 % cream Apply 1 application topically 2 (two) times daily. For irritation. 09/03/11  Yes Renato Shin, MD  cyanocobalamin (,VITAMIN B-12,) 1000 MCG/ML injection Inject 1,000 mcg into the muscle every 30 (thirty) days.   Yes Historical Provider, MD  furosemide (LASIX) 80 MG tablet Take 80 mg by mouth daily. 04/24/11  Yes Simbiso Ranga, MD  hydrALAZINE (APRESOLINE) 25 MG tablet Take 1 tablet (25 mg total) by mouth 3 (three) times daily. 07/29/11 07/28/12 Yes Renato Shin, MD  insulin NPH-insulin regular (NOVOLIN 70/30) (70-30) 100 UNIT/ML injection Inject 18-25 Units into the skin 2 (two) times daily with a meal. 18 units with breakfast, and 25 units with the evening meal 06/11/11  Yes Renato Shin, MD  Insulin Syringe-Needle U-100 31G X 5/16" 0.3 ML MISC Use as directed twice a day 11/27/10  Yes Renato Shin, MD  isosorbide mononitrate (ISMO,MONOKET) 10 MG tablet Take 1 tablet (10 mg total) by mouth 2 (two) times  daily. 09/28/11 09/27/12 Yes Renato Shin, MD  lactulose (CHRONULAC) 10 GM/15ML solution Take 15 ml by mouth twice a day as needed    Yes Historical Provider, MD  losartan (COZAAR) 100 MG tablet Take 1 tablet (100 mg total) by mouth daily. 09/28/11  Yes Renato Shin, MD  omeprazole (PRILOSEC) 40 MG capsule Take 1 capsule (40 mg total) by mouth daily. 09/28/11 09/27/12 Yes  Renato Shin, MD  prednisoLONE acetate (PRED FORTE) 1 % ophthalmic suspension Place 1 drop into both eyes daily. 08/09/11  Yes Historical Provider, MD  nitroGLYCERIN (NITROSTAT) 0.4 MG SL tablet Place 1 tablet (0.4 mg total) under the tongue every 5 (five) minutes as needed for chest pain. 04/24/11 04/23/12  Simbiso Ranga, MD  ofloxacin (OCUFLOX) 0.3 % ophthalmic solution Take 1 tablet by mouth daily. 08/09/11   Historical Provider, MD     Allergies:  No Known Allergies  Social History:   reports that he has never smoked. He does not have any smokeless tobacco history on file. He reports that he does not drink alcohol or use illicit drugs.  Family History: Family History  Problem Relation Age of Onset  . Cancer Father     had uncertain type of cancer  . Cancer Brother     Prostate Cancer  . Cancer Brother     Prostate Cancer     Physical Exam: Filed Vitals:   10/02/11 1923  BP: 173/77  Pulse: 89  Temp: 97.8 F (36.6 C)  TempSrc: Oral  Resp: 18  SpO2: 95%   Blood pressure 173/77, pulse 89, temperature 97.8 F (36.6 C), temperature source Oral, resp. rate 18, SpO2 95.00%.  GEN:  Pleasant  person lying in the stretcher in no acute distress; cooperative with exam PSYCH:  alert and oriented x4; does not appear anxious or depressed; affect is appropriate. HEENT: Mucous membranes pink and anicteric; PERRLA; EOM intact; no cervical lymphadenopathy nor thyromegaly or carotid bruit; no JVD; Breasts:: Not examined CHEST WALL: No tenderness CHEST: Normal respiration, clear to auscultation bilaterally HEART: Regular rate and rhythm; though he has no murmur, he has fixed split S2 BACK: No kyphosis or scoliosis; no CVA tenderness ABDOMEN: Obese, soft non-tender; no masses, no organomegaly, normal abdominal bowel sounds; no pannus; no intertriginous candida. Rectal Exam: Not done EXTREMITIES: No bone or joint deformity; age-appropriate arthropathy of the hands and knees; 2+ pitting edema  bilaterally  Genitalia: not examined PULSES: 2+ and symmetric SKIN: Normal hydration no rash or ulceration CNS: Cranial nerves 2-12 grossly intact no focal lateralizing neurologic deficit   Labs & Imaging Results for orders placed during the hospital encounter of 10/02/11 (from the past 48 hour(s))  CBC     Status: Abnormal   Collection Time   10/02/11  7:38 PM      Component Value Range Comment   WBC 9.2  4.0 - 10.5 K/uL    RBC 3.52 (*) 4.22 - 5.81 MIL/uL    Hemoglobin 11.4 (*) 13.0 - 17.0 g/dL    HCT 31.6 (*) 39.0 - 52.0 %    MCV 89.8  78.0 - 100.0 fL    MCH 32.4  26.0 - 34.0 pg    MCHC 36.1 (*) 30.0 - 36.0 g/dL    RDW 12.9  11.5 - 15.5 %    Platelets 206  150 - 400 K/uL   DIFFERENTIAL     Status: Abnormal   Collection Time   10/02/11  7:38 PM      Component Value Range  Comment   Neutrophils Relative 94 (*) 43 - 77 %    Neutro Abs 8.7 (*) 1.7 - 7.7 K/uL    Lymphocytes Relative 5 (*) 12 - 46 %    Lymphs Abs 0.5 (*) 0.7 - 4.0 K/uL    Monocytes Relative 0 (*) 3 - 12 %    Monocytes Absolute 0.0 (*) 0.1 - 1.0 K/uL    Eosinophils Relative 0  0 - 5 %    Eosinophils Absolute 0.0  0.0 - 0.7 K/uL    Basophils Relative 0  0 - 1 %    Basophils Absolute 0.0  0.0 - 0.1 K/uL   PRO B NATRIURETIC PEPTIDE     Status: Normal   Collection Time   10/02/11  7:38 PM      Component Value Range Comment   Pro B Natriuretic peptide (BNP) 396.1  0 - 450 pg/mL   COMPREHENSIVE METABOLIC PANEL     Status: Abnormal   Collection Time   10/02/11  7:38 PM      Component Value Range Comment   Sodium 122 (*) 135 - 145 mEq/L    Potassium 3.7  3.5 - 5.1 mEq/L    Chloride 85 (*) 96 - 112 mEq/L    CO2 19  19 - 32 mEq/L    Glucose, Bld 327 (*) 70 - 99 mg/dL    BUN 30 (*) 6 - 23 mg/dL    Creatinine, Ser 1.35  0.50 - 1.35 mg/dL    Calcium 8.9  8.4 - 10.5 mg/dL    Total Protein 7.7  6.0 - 8.3 g/dL    Albumin 2.3 (*) 3.5 - 5.2 g/dL    AST 34  0 - 37 U/L    ALT 30  0 - 53 U/L    Alkaline Phosphatase 110  39 -  117 U/L    Total Bilirubin 1.2  0.3 - 1.2 mg/dL    GFR calc non Af Amer 46 (*) >90 mL/min    GFR calc Af Amer 54 (*) >90 mL/min   POCT I-STAT TROPONIN I     Status: Normal   Collection Time   10/02/11  8:02 PM      Component Value Range Comment   Troponin i, poc 0.00  0.00 - 0.08 ng/mL    Comment 3            URINALYSIS, ROUTINE W REFLEX MICROSCOPIC     Status: Abnormal   Collection Time   10/02/11  8:10 PM      Component Value Range Comment   Color, Urine YELLOW  YELLOW    APPearance CLOUDY (*) CLEAR    Specific Gravity, Urine 1.022  1.005 - 1.030    pH 5.5  5.0 - 8.0    Glucose, UA >1000 (*) NEGATIVE mg/dL    Hgb urine dipstick MODERATE (*) NEGATIVE    Bilirubin Urine NEGATIVE  NEGATIVE    Ketones, ur 15 (*) NEGATIVE mg/dL    Protein, ur 100 (*) NEGATIVE mg/dL    Urobilinogen, UA 1.0  0.0 - 1.0 mg/dL    Nitrite NEGATIVE  NEGATIVE    Leukocytes, UA NEGATIVE  NEGATIVE   URINE MICROSCOPIC-ADD ON     Status: Abnormal   Collection Time   10/02/11  8:10 PM      Component Value Range Comment   Squamous Epithelial / LPF FEW (*) RARE    WBC, UA 0-2  <3 WBC/hpf    RBC / HPF 3-6  <  3 RBC/hpf    Bacteria, UA FEW (*) RARE    Casts GRANULAR CAST (*) NEGATIVE HYALINE CASTS  LACTIC ACID, PLASMA     Status: Abnormal   Collection Time   10/02/11  9:24 PM      Component Value Range Comment   Lactic Acid, Venous 2.7 (*) 0.5 - 2.2 mmol/L   GLUCOSE, CAPILLARY     Status: Abnormal   Collection Time   10/02/11  9:57 PM      Component Value Range Comment   Glucose-Capillary 325 (*) 70 - 99 mg/dL    No results found.    Assessment Present on Admission:  .Dependent edema .CKD (chronic kidney disease) stage 3, GFR 30-59 ml/min .Hyponatremia .ANEMIA-NOS .PSA, INCREASED .HYPERTENSION .DIABETES MELLITUS, TYPE I, CONTROLLED, WITH RETINOPATHY   PLAN: Bilateral pedal edema in a patient with chronic kidney disease, family history of prostate cancer, and elevated PSA, along with electrolyte  abnormalities including hyponatremia. Although he is edematous, he is actually intra-volume depleted. Will give gentle normal saline, fluid restrict to 1200 cc daily free water, and use IV Lasix 40 mg IV Q. 12 to get red of free water as well. The workup will include bilateral lower extremity Dopplers, echo for heart, abdominal pelvic CT without contrast (I like to exclude prostate cancer with metastases, though he had history of TURP) for his diabetes will continue his medications, use insulin sliding scale. I approach him about social service consult for short-term rehabilitation, but he refused this of her offer. He is stable, full code, and will be admitted to [facility] hospitalist service. Let's get a chest x-ray just to be complete.   Other plans as per orders.    Vora Clover 10/02/2011, 11:04 PM

## 2011-10-02 NOTE — ED Provider Notes (Signed)
History     CSN: [id number]  Arrival date & time 10/02/11  1918   First MD Initiated Contact with Patient 10/02/11 2055      Chief Complaint  Patient presents with  . Foot Swelling     HPI Eric [name] has been brought in by his daughter. He was evaluated at Dr Cordelia Pen office yesterday and had blood work done. They are not aware of the results. Daughters main concerns are - edema of the feet which are now painful - refusal to elevate his feet despite multiple reminders from family - severe weakness and recent falls- - inability to take medications appropriately - loss of appetite recently.  - living alone and continuing to work He was ordered TED hose but struggled to take them off the other day for about 2 hrs and fell. No one else was present- pt cannot recall if he was unconscious Past Medical History  Diagnosis Date  . ANEMIA-NOS 02/17/2007  . DEPRESSION 12/25/2008  . HYPERTENSION 02/17/2007  . GERD 02/17/2007  . OSTEOARTHRITIS 03/04/2010  . DIABETES MELLITUS, TYPE I, CONTROLLED, WITH RETINOPATHY   . Gastroparesis   . BENIGN PROSTATIC HYPERTROPHY   . VITAMIN B12 DEFICIENCY   . BACK PAIN, LUMBAR   . LIVER DISORDER   . PSA, INCREASED     Past Surgical History  Procedure Date  . Transurethral resection of prostate     Family History  Problem Relation Age of Onset  . Cancer Father     had uncertain type of cancer  . Cancer Brother     Prostate Cancer  . Cancer Brother     Prostate Cancer    History  Substance Use Topics  . Smoking status: Never Smoker   . Smokeless tobacco: Not on file  . Alcohol Use: No      Review of Systems  All other systems reviewed and are negative.    Allergies  Review of patient's allergies indicates no known allergies.  Home Medications   Current Outpatient Rx  Name Route Sig Dispense Refill  . ACETAMINOPHEN 500 MG PO TABS Oral Take 1,000 mg by mouth every 6 (six) hours as needed. For pain.     . ASPIRIN 81 MG PO TABS  Oral Take 81 mg by mouth daily.      . ATORVASTATIN CALCIUM 20 MG PO TABS Oral Take 1 tablet (20 mg total) by mouth daily. 90 tablet 1  . BAYER CONTOUR TEST VI STRP  USE TWICE DAILY 100 strip 4  . BAYER CONTOUR TEST VI STRP  USE TO TEST TWICE DAILY 100 strip 2  . CALCITRIOL 0.25 MCG PO CAPS Oral Take 1 capsule (0.25 mcg total) by mouth daily. 30 capsule 11  . CLOTRIMAZOLE 1 % EX CREA Topical Apply 1 application topically 2 (two) times daily. For irritation. 30 g 3  . CYANOCOBALAMIN 1000 MCG/ML IJ SOLN Intramuscular Inject 1,000 mcg into the muscle every 30 (thirty) days.    . FUROSEMIDE 80 MG PO TABS Oral Take 80 mg by mouth daily.    Marland Kitchen HYDRALAZINE HCL 25 MG PO TABS Oral Take 1 tablet (25 mg total) by mouth 3 (three) times daily. 90 tablet 3  . INSULIN ISOPHANE & REGULAR (70-30) 100 UNIT/ML [place] SUSP Subcutaneous Inject 18-25 Units into the skin 2 (two) times daily with a meal. 18 units with breakfast, and 25 units with the evening meal    . INSULIN SYRINGE-NEEDLE U-100 31G X 5/16" 0.3 ML MISC  Use as  directed twice a day 60 each 5  . ISOSORBIDE MONONITRATE 10 MG PO TABS Oral Take 1 tablet (10 mg total) by mouth 2 (two) times daily. 180 tablet 1  . LACTULOSE 10 GM/15ML PO SOLN  Take 15 ml by mouth twice a day as needed     . LOSARTAN POTASSIUM 100 MG PO TABS Oral Take 1 tablet (100 mg total) by mouth daily. 90 tablet 1  . OMEPRAZOLE 40 MG PO CPDR Oral Take 1 capsule (40 mg total) by mouth daily. 90 capsule 1  . PREDNISOLONE ACETATE 1 % OP SUSP Both Eyes Place 1 drop into both eyes daily.    Marland Kitchen NITROGLYCERIN 0.4 MG SL SUBL Sublingual Place 1 tablet (0.4 mg total) under the tongue every 5 (five) minutes as needed for chest pain. 10 tablet 0  . OFLOXACIN 0.3 % OP SOLN Oral Take 1 tablet by mouth daily.      BP 176/75  Pulse 88  Temp 98.6 F (37 C) (Oral)  Resp 16  SpO2 94%  Physical Exam Constitutional: He is oriented to person, place, and time. He appears well-developed and well-nourished. No  distress.  HENT:  Head: Normocephalic.  Mouth/Throat: Oropharynx is clear and moist.  Eyes: EOM are normal. Pupils are equal, round, and reactive to light. Right eye exhibits discharge. Left eye exhibits discharge. No scleral icterus.       Mild yellow discharge from both eyes  Neck: Normal range of motion. No JVD present. No tracheal deviation present.  Cardiovascular: Normal rate, regular rhythm and normal heart sounds.   No murmur heard.      Severe pitting edema up to knees  Pulmonary/Chest: Effort normal and breath sounds normal. No stridor. No respiratory distress. He has no wheezes. He has no rales.  Abdominal: Soft. Bowel sounds are normal. He exhibits no distension. There is no tenderness.  Neurological: He is alert and oriented to person, place, and time.  Skin: Skin is warm and dry. He is not diaphoretic.  ED Course  Procedures (including critical care time) Anion gap = 18 this is an increase from 13 yesterday.  Plan: Will consult internal medicine for possible admission   Labs Reviewed  URINALYSIS, ROUTINE W REFLEX MICROSCOPIC - Abnormal; Notable for the following:    APPearance CLOUDY (*)     Glucose, UA >1000 (*)     Hgb urine dipstick MODERATE (*)     Ketones, ur 15 (*)     Protein, ur 100 (*)     All other components within normal limits  CBC - Abnormal; Notable for the following:    RBC 3.52 (*)     Hemoglobin 11.4 (*)     HCT 31.6 (*)     MCHC 36.1 (*)     All other components within normal limits  DIFFERENTIAL - Abnormal; Notable for the following:    Neutrophils Relative 94 (*)     Neutro Abs 8.7 (*)     Lymphocytes Relative 5 (*)     Lymphs Abs 0.5 (*)     Monocytes Relative 0 (*)     Monocytes Absolute 0.0 (*)     All other components within normal limits  COMPREHENSIVE METABOLIC PANEL - Abnormal; Notable for the following:    Sodium 122 (*)     Chloride 85 (*)     Glucose, Bld 327 (*)     BUN 30 (*)     Albumin 2.3 (*)     GFR calc non  Af Amer  46 (*)     GFR calc Af Amer 54 (*)     All other components within normal limits  URINE MICROSCOPIC-ADD ON - Abnormal; Notable for the following:    Squamous Epithelial / LPF FEW (*)     Bacteria, UA FEW (*)     Casts GRANULAR CAST (*)  HYALINE CASTS   All other components within normal limits  LACTIC ACID, PLASMA - Abnormal; Notable for the following:    Lactic Acid, Venous 2.7 (*)     All other components within normal limits  GLUCOSE, CAPILLARY - Abnormal; Notable for the following:    Glucose-Capillary 325 (*)     All other components within normal limits  PRO B NATRIURETIC PEPTIDE  POCT I-STAT TROPONIN I  HEMOGLOBIN A1C   No results found.   1. High anion gap metabolic acidosis   2. Hyperglycemia   3. Pedal edema       MDM          Dot Lanes, MD 10/02/11 [phone]

## 2011-10-03 ENCOUNTER — Inpatient Hospital Stay (HOSPITAL_COMMUNITY): Payer: Medicare Other

## 2011-10-03 ENCOUNTER — Encounter (HOSPITAL_COMMUNITY): Payer: Self-pay | Admitting: Nurse Practitioner

## 2011-10-03 DIAGNOSIS — E871 Hypo-osmolality and hyponatremia: Secondary | ICD-10-CM

## 2011-10-03 DIAGNOSIS — M7989 Other specified soft tissue disorders: Secondary | ICD-10-CM

## 2011-10-03 DIAGNOSIS — R609 Edema, unspecified: Secondary | ICD-10-CM

## 2011-10-03 DIAGNOSIS — E1065 Type 1 diabetes mellitus with hyperglycemia: Secondary | ICD-10-CM

## 2011-10-03 DIAGNOSIS — N183 Chronic kidney disease, stage 3 (moderate): Secondary | ICD-10-CM

## 2011-10-03 LAB — BASIC METABOLIC PANEL
BUN: 28 mg/dL — ABNORMAL HIGH (ref 6–23)
CO2: 18 mEq/L — ABNORMAL LOW (ref 19–32)
Calcium: 8.2 mg/dL — ABNORMAL LOW (ref 8.4–10.5)
Chloride: 89 mEq/L — ABNORMAL LOW (ref 96–112)
Chloride: 87 mEq/L — ABNORMAL LOW (ref 96–112)
Creatinine, Ser: 1.23 mg/dL (ref 0.50–1.35)
Creatinine, Ser: 1.26 mg/dL (ref 0.50–1.35)
GFR calc Af Amer: 58 mL/min — ABNORMAL LOW (ref >90)
GFR calc non Af Amer: 50 mL/min — ABNORMAL LOW (ref >90)
Potassium: 3.6 mEq/L (ref 3.5–5.1)
Sodium: 124 mEq/L — ABNORMAL LOW (ref 135–145)

## 2011-10-03 LAB — CBC
HCT: 33.1 % — ABNORMAL LOW (ref 39.0–52.0)
Hemoglobin: 12 g/dL — ABNORMAL LOW (ref 13.0–17.0)
MCV: 88 fL (ref 78.0–100.0)
RBC: 3.76 MIL/uL — ABNORMAL LOW (ref 4.22–5.81)
WBC: 9.5 10*3/uL (ref 4.0–10.5)

## 2011-10-03 LAB — OSMOLALITY: Osmolality: 271 mOsm/kg — ABNORMAL LOW (ref 275–300)

## 2011-10-03 LAB — GLUCOSE, CAPILLARY
Glucose-Capillary: 335 mg/dL — ABNORMAL HIGH (ref 70–99)
Glucose-Capillary: 206 mg/dL — ABNORMAL HIGH (ref 70–99)
Glucose-Capillary: 221 mg/dL — ABNORMAL HIGH (ref 70–99)

## 2011-10-03 LAB — HEMOGLOBIN A1C: Mean Plasma Glucose: 295 mg/dL — ABNORMAL HIGH (ref <117)

## 2011-10-03 LAB — CREATININE, URINE, RANDOM: Creatinine, Urine: 62.45 mg/dL

## 2011-10-03 MED ORDER — IOHEXOL 300 MG/ML  SOLN
20.0000 mL | INTRAMUSCULAR | Status: AC
  Administered 2011-10-03 (×2): 20 mL via ORAL

## 2011-10-03 MED ORDER — ISOSORBIDE MONONITRATE 10 MG PO TABS
10.0000 mg | ORAL_TABLET | Freq: Two times a day (BID) | ORAL | Status: DC
  Administered 2011-10-03 – 2011-10-07 (×9): 10 mg via ORAL
  Administered 2011-10-07: 18:00:00 via ORAL
  Administered 2011-10-08 – 2011-10-09 (×3): 10 mg via ORAL
  Administered 2011-10-09: 15:00:00 via ORAL
  Administered 2011-10-10 – 2011-10-16 (×13): 10 mg via ORAL
  Administered 2011-10-16: 18:00:00 via ORAL
  Administered 2011-10-17: 10 mg via ORAL
  Filled 2011-10-03 (×32): qty 1

## 2011-10-03 MED ORDER — INSULIN ASPART PROT & ASPART (70-30 MIX) 100 UNIT/ML ~~LOC~~ SUSP
5.0000 [IU] | Freq: Once | SUBCUTANEOUS | Status: AC
  Administered 2011-10-03: 5 [IU] via SUBCUTANEOUS

## 2011-10-03 MED ORDER — INSULIN ASPART PROT & ASPART (70-30 MIX) 100 UNIT/ML ~~LOC~~ SUSP
25.0000 [IU] | Freq: Two times a day (BID) | SUBCUTANEOUS | Status: DC
  Administered 2011-10-03 – 2011-10-10 (×14): 25 [IU] via SUBCUTANEOUS
  Filled 2011-10-03: qty 3

## 2011-10-03 MED ORDER — INSULIN ASPART PROT & ASPART (70-30 MIX) 100 UNIT/ML ~~LOC~~ SUSP
20.0000 [IU] | Freq: Two times a day (BID) | SUBCUTANEOUS | Status: DC
  Administered 2011-10-03: 20 [IU] via SUBCUTANEOUS
  Filled 2011-10-03: qty 3

## 2011-10-03 MED ORDER — PANTOPRAZOLE SODIUM 40 MG PO TBEC
40.0000 mg | DELAYED_RELEASE_TABLET | Freq: Every day | ORAL | Status: DC
  Administered 2011-10-03 – 2011-10-17 (×15): 40 mg via ORAL
  Filled 2011-10-03 (×11): qty 1

## 2011-10-03 MED ORDER — OFLOXACIN 0.3 % OP SOLN
1.0000 [drp] | OPHTHALMIC | Status: AC
  Administered 2011-10-03 – 2011-10-05 (×14): 1 [drp] via OPHTHALMIC
  Filled 2011-10-03: qty 5

## 2011-10-03 MED ORDER — INSULIN ASPART PROT & ASPART (70-30 MIX) 100 UNIT/ML ~~LOC~~ SUSP: 8.0000 [IU] | Freq: Once | SUBCUTANEOUS | Status: DC

## 2011-10-03 MED ORDER — ASPIRIN EC 81 MG PO TBEC
81.0000 mg | DELAYED_RELEASE_TABLET | Freq: Every day | ORAL | Status: DC
  Administered 2011-10-03 – 2011-10-17 (×15): 81 mg via ORAL
  Filled 2011-10-03 (×15): qty 1

## 2011-10-03 MED ORDER — ATORVASTATIN CALCIUM 20 MG PO TABS
20.0000 mg | ORAL_TABLET | Freq: Every day | ORAL | Status: DC
  Administered 2011-10-03 – 2011-10-17 (×15): 20 mg via ORAL
  Filled 2011-10-03 (×15): qty 1

## 2011-10-03 MED ORDER — PREDNISOLONE ACETATE 1 % OP SUSP
1.0000 [drp] | Freq: Every day | OPHTHALMIC | Status: DC
  Administered 2011-10-03 – 2011-10-28 (×26): 1 [drp] via OPHTHALMIC
  Filled 2011-10-03 (×2): qty 1

## 2011-10-03 MED ORDER — FUROSEMIDE 10 MG/ML IJ SOLN
40.0000 mg | Freq: Two times a day (BID) | INTRAMUSCULAR | Status: DC
  Administered 2011-10-03 – 2011-10-05 (×5): 40 mg via INTRAVENOUS
  Filled 2011-10-03 (×9): qty 4

## 2011-10-03 MED ORDER — CALCITRIOL 0.25 MCG PO CAPS
0.2500 ug | ORAL_CAPSULE | Freq: Every day | ORAL | Status: DC
  Administered 2011-10-03 – 2011-10-17 (×15): 0.25 ug via ORAL
  Filled 2011-10-03 (×15): qty 1

## 2011-10-03 MED ORDER — POTASSIUM CHLORIDE CRYS ER 20 MEQ PO TBCR
40.0000 meq | EXTENDED_RELEASE_TABLET | Freq: Once | ORAL | Status: AC
  Administered 2011-10-03: 21:00:00 via ORAL
  Filled 2011-10-03: qty 2

## 2011-10-03 MED ORDER — TRAMADOL HCL 50 MG PO TABS
50.0000 mg | ORAL_TABLET | Freq: Four times a day (QID) | ORAL | Status: DC | PRN
  Administered 2011-10-03 – 2011-10-14 (×4): 50 mg via ORAL
  Filled 2011-10-03 (×4): qty 1

## 2011-10-03 MED ORDER — INSULIN ASPART PROT & ASPART (70-30 MIX) 100 UNIT/ML ~~LOC~~ SUSP: 28.0000 [IU] | Freq: Two times a day (BID) | SUBCUTANEOUS | Status: DC

## 2011-10-03 MED ORDER — LACTULOSE 10 GM/15ML PO SOLN
10.0000 g | Freq: Every day | ORAL | Status: DC | PRN
  Administered 2011-10-14: 10 g via ORAL
  Filled 2011-10-03 (×7): qty 15

## 2011-10-03 MED ORDER — HEPARIN SODIUM (PORCINE) 5000 UNIT/ML IJ SOLN
5000.0000 [IU] | Freq: Three times a day (TID) | INTRAMUSCULAR | Status: DC
  Administered 2011-10-03 – 2011-10-28 (×76): 5000 [IU] via SUBCUTANEOUS
  Filled 2011-10-03 (×79): qty 1

## 2011-10-03 MED ORDER — SODIUM CHLORIDE 0.9 % IV SOLN: INTRAVENOUS | Status: DC

## 2011-10-03 MED ORDER — HYDRALAZINE HCL 25 MG PO TABS
25.0000 mg | ORAL_TABLET | Freq: Three times a day (TID) | ORAL | Status: DC
  Administered 2011-10-03 – 2011-10-08 (×16): 25 mg via ORAL
  Filled 2011-10-03 (×19): qty 1

## 2011-10-03 MED ORDER — CYANOCOBALAMIN 1000 MCG/ML IJ SOLN: 1000.0000 ug | INTRAMUSCULAR | Status: DC

## 2011-10-03 MED ORDER — SODIUM CHLORIDE 0.9 % IJ SOLN
3.0000 mL | Freq: Two times a day (BID) | INTRAMUSCULAR | Status: DC
  Administered 2011-10-03 – 2011-10-28 (×48): 3 mL via INTRAVENOUS

## 2011-10-03 MED ORDER — KCL IN DEXTROSE-NACL 20-5-0.9 MEQ/L-%-% IV SOLN
INTRAVENOUS | Status: DC
  Administered 2011-10-03: 02:00:00 via INTRAVENOUS
  Filled 2011-10-03 (×2): qty 1000

## 2011-10-03 NOTE — Progress Notes (Signed)
Subjective: Reports feeling weak this morning.  Still has swelling in his lower extremities.  No other specific complaints.  Objective: Vital signs in last 24 hours: Filed Vitals:   10/02/11 2318 10/02/11 2330 10/03/11 0116 10/03/11 0516  BP: 176/75 174/72 155/75 177/62  Pulse: 88 85 99 91  Temp: 98.6 F (37 C)  98.2 F (36.8 C) 98.8 F (37.1 C)  TempSrc: Oral  Oral Oral  Resp: 16 17 18 18   Height:   5\' 10"  (1.778 m)   Weight:   71.2 kg (156 lb 15.5 oz) 71.2 kg (156 lb 15.5 oz)  SpO2: 94% 98% 94% 95%   Weight change:   Intake/Output Summary (Last 24 hours) at 10/03/11 0839 Last data filed at 10/03/11 0653  Gross per 24 hour  Intake 383.83 ml  Output    450 ml  Net -66.17 ml    Physical Exam: General: Awake, Oriented, No acute distress. HEENT: EOMI. Neck: Supple CV: S1 and S2 Lungs: Clear to ascultation bilaterally Abdomen: Soft, Nontender, Nondistended, +bowel sounds. Ext: Good pulses. 2-3+ LE edema.  Lab Results: Basic Metabolic Panel:  Lab [id number] 10/02/11 1938 10/01/11 1036  NA 124* 122* 126*  K 3.6 3.7 4.1  CL 89* 85* 95*  CO2 18* 19 18*  GLUCOSE 328* 327* 313*  BUN 29* 30* 24*  CREATININE 1.23 1.35 1.6*  CALCIUM 8.5 8.9 7.9*  MG -- -- --  PHOS -- -- --   Liver Function Tests:  Lab 10/02/11 1938  AST 34  ALT 30  ALKPHOS 110  BILITOT 1.2  PROT 7.7  ALBUMIN 2.3*   No results found for this basename: LIPASE:5,AMYLASE:5 in the last 168 hours No results found for this basename: AMMONIA:5 in the last 168 hours CBC:  Lab 10/03/11 0710 10/02/11 1938  WBC 9.5 9.2  NEUTROABS -- 8.7*  HGB 12.0* 11.4*  HCT 33.1* 31.6*  MCV 88.0 89.8  PLT 239 206   Cardiac Enzymes: No results found for this basename: CKTOTAL:5,CKMB:5,CKMBINDEX:5,TROPONINI:5 in the last 168 hours BNP (last 3 results)  Basename 10/02/11 1938 10/01/11 1036 03/30/11 1055  PROBNP 396.1 1962.0* 27.0   CBG:  Lab 10/03/11 0630 10/03/11 0127 10/02/11 2157  GLUCAP 335* 307* 325*     Basename 10/01/11 1036  HGBA1C 12.0*   Other Labs: No components found with this basename: POCBNP:3 No results found for this basename: DDIMER:2 in the last 168 hours No results found for this basename: CHOL:2,HDL:2,LDLCALC:2,TRIG:2,CHOLHDL:2,LDLDIRECT:2 in the last 168 hours No results found for this basename: TSH,T4TOTAL,FREET3,T3FREE,FREET4,THYROIDAB in the last 168 hours No results found for this basename: VITAMINB12:2,FOLATE:2,FERRITIN:2,TIBC:2,IRON:2,RETICCTPCT:2 in the last 168 hours  Micro Results: No results found for this or any previous visit (from the past 240 hour(s)).  Studies/Results: Dg Chest 2 View  10/03/2011  *RADIOLOGY REPORT*  Clinical Data: Bilateral leg swelling and pain for one week.  CHEST - 2 VIEW  Comparison: Chest radiograph performed 06/01/2011  Findings: The lungs are well-aerated.  Left lower lobe airspace opacification raises concern for pneumonia.  However, this does not appear to match the patient's clinical history; suggest clinical correlation and further evaluation as deemed appropriate.  There is no evidence of pleural effusion or pneumothorax.  The heart is normal in size; the mediastinal contour is within normal limits.  No acute osseous abnormalities are seen.  IMPRESSION: Left lower lobe airspace opacity raises concern for pneumonia. However, this does not appear to match the patient's clinical history; suggest clinical correlation and further evaluation as deemed appropriate.  Original Report Authenticated By: Santa Lighter, M.D.    Medications: I have reviewed the patient's current medications. Scheduled Meds:   . aspirin EC  81 mg Oral Daily  . atorvastatin  20 mg Oral Daily  . calcitRIOL  0.25 mcg Oral Daily  . cyanocobalamin  1,000 mcg Intramuscular Q30 days  . furosemide  40 mg Intravenous BID  . heparin  5,000 Units Subcutaneous Q8H  . hydrALAZINE  25 mg Oral Q8H  . insulin aspart  0-9 Units Subcutaneous TID WC  . insulin aspart  protamine-insulin aspart  25 Units Subcutaneous BID WC  . insulin aspart protamine-insulin aspart  5 Units Subcutaneous Once  . iohexol  20 mL Oral Q1 Hr x 2  . isosorbide mononitrate  10 mg Oral BID WC  . ofloxacin  1 drop Both Eyes Q4H while awake  . pantoprazole  40 mg Oral Q1200  . prednisoLONE acetate  1 drop Both Eyes Daily  . sodium chloride  3 mL Intravenous Q12H  . DISCONTD: insulin aspart protamine-insulin aspart  20 Units Subcutaneous BID WC  . DISCONTD: insulin aspart protamine-insulin aspart  28 Units Subcutaneous BID WC  . DISCONTD: insulin aspart protamine-insulin aspart  8 Units Subcutaneous Once   Continuous Infusions:   . DISCONTD: sodium chloride    . DISCONTD: dextrose 5 % and 0.9 % NaCl with KCl 20 mEq/L 50 mL/hr at 10/03/11 0140   PRN Meds:.lactulose  Assessment/Plan: Bilateral lower extremity edema likely due to decompensated acute on chronic diastolic heart failure Patient had 2-D echocardiogram on 04/21/2011 which showed systolic function was normal, ejection fraction 55-60%, wall motion was normal, there were normal no wall motion abnormalities. Given recent ECHO do not see the need for another ECHO at this time. Continue diuresis with lasix.  DM (diabetes mellitus), type 1, uncontrolled w/ophthalmic complication Increase NovoLog 70/30.  Continue SSI.  Generalized weakness Will request PT/OT.  Hyponatremia Likely due to acute on chronic diastolic heart failure. Order urine sodium, creatinine, osmolarity. Send serum osmolarity and BMET.  Anemia Hemoglobin stable.  Maybe due to B12 deficiency continue B12 injections.  Chronic kidney disease stage III Creatinine at baseline.  Hyperlipidemia Continue statin.  Prophylaxis Subcutaneous heparin.  Code status Full code  Disposition Pending improvement in edema, sodium, and PT evaluation.   LOS: 1 day  Bronsyn Shappell A, MD 10/03/2011, 8:39 AM

## 2011-10-03 NOTE — Progress Notes (Addendum)
*  PRELIMINARY RESULTS* Vascular Ultrasound Lower extremity venous duplex has been completed.  Preliminary findings: Bilaterally no obvious evidence of DVT. Right peroneal veins are non-compressible, therefore DVT cannot be ruled out in this area.  Landry Mellow, RDMS 10/03/2011, 10:32 AM

## 2011-10-04 DIAGNOSIS — R609 Edema, unspecified: Secondary | ICD-10-CM

## 2011-10-04 DIAGNOSIS — N183 Chronic kidney disease, stage 3 unspecified: Secondary | ICD-10-CM

## 2011-10-04 DIAGNOSIS — E871 Hypo-osmolality and hyponatremia: Secondary | ICD-10-CM

## 2011-10-04 DIAGNOSIS — E1065 Type 1 diabetes mellitus with hyperglycemia: Secondary | ICD-10-CM

## 2011-10-04 DIAGNOSIS — IMO0002 Reserved for concepts with insufficient information to code with codable children: Secondary | ICD-10-CM

## 2011-10-04 LAB — GLUCOSE, CAPILLARY
Glucose-Capillary: 151 mg/dL — ABNORMAL HIGH (ref 70–99)
Glucose-Capillary: 163 mg/dL — ABNORMAL HIGH (ref 70–99)
Glucose-Capillary: 198 mg/dL — ABNORMAL HIGH (ref 70–99)

## 2011-10-04 LAB — CBC
HCT: 30.1 % — ABNORMAL LOW (ref 39.0–52.0)
Hemoglobin: 10.8 g/dL — ABNORMAL LOW (ref 13.0–17.0)
MCH: 31.3 pg (ref 26.0–34.0)
MCV: 87.2 fL (ref 78.0–100.0)
Platelets: 242 10*3/uL (ref 150–400)
RBC: 3.45 MIL/uL — ABNORMAL LOW (ref 4.22–5.81)
WBC: 8 10*3/uL (ref 4.0–10.5)

## 2011-10-04 LAB — BASIC METABOLIC PANEL
CO2: 22 mEq/L (ref 19–32)
Calcium: 8.5 mg/dL (ref 8.4–10.5)
Chloride: 89 mEq/L — ABNORMAL LOW (ref 96–112)
Creatinine, Ser: 1.36 mg/dL — ABNORMAL HIGH (ref 0.50–1.35)
Glucose, Bld: 147 mg/dL — ABNORMAL HIGH (ref 70–99)

## 2011-10-04 LAB — MAGNESIUM: Magnesium: 1.9 mg/dL (ref 1.5–2.5)

## 2011-10-04 LAB — OSMOLALITY, URINE: Osmolality, Ur: 335 mOsm/kg — ABNORMAL LOW (ref 390–1090)

## 2011-10-04 MED ORDER — SENNOSIDES-DOCUSATE SODIUM 8.6-50 MG PO TABS
1.0000 | ORAL_TABLET | Freq: Two times a day (BID) | ORAL | Status: DC
  Administered 2011-10-04 – 2011-10-18 (×26): 1 via ORAL
  Filled 2011-10-04 (×29): qty 1

## 2011-10-04 MED ORDER — POLYETHYLENE GLYCOL 3350 17 G PO PACK
17.0000 g | PACK | Freq: Every day | ORAL | Status: DC | PRN
  Administered 2011-10-04 – 2011-10-13 (×3): 17 g via ORAL
  Filled 2011-10-04 (×3): qty 1

## 2011-10-04 NOTE — Evaluation (Signed)
Occupational Therapy Evaluation Patient Details Name: Eric [name] MRN: [id number] DOB: 06/02/1923 Today's Date: 10/04/2011 Time: [id number] OT Time Calculation (min): 23 min  OT Assessment / Plan / Recommendation Clinical Impression  Pt. presents with LE edema and complication from hyponatremia. Pt. with below problem list and will benefit from skilled OT to increase functional independence with ADLs to supervision level at D/C to next venue of care.    OT Assessment  Patient needs continued OT Services    Follow Up Recommendations  Skilled nursing facility    Barriers to Discharge Decreased caregiver support    Equipment Recommendations  Defer to next venue       Frequency  Min 2X/week    Precautions / Restrictions Precautions Precautions: Fall Restrictions Weight Bearing Restrictions: No   Pertinent Vitals/Pain Noted pt. At 88% on RA; RN notified and 2LO2 [place] applied to increase SpO2.    ADL  Eating/Feeding: Simulated;Set up Where Assessed - Eating/Feeding: Chair Grooming: Performed;Wash/dry face;Wash/dry hands;Teeth care;Set up;Min guard Where Assessed - Grooming: Unsupported standing Upper Body Bathing: Simulated;Set up Where Assessed - Upper Body Bathing: Unsupported sitting Lower Body Bathing: Simulated;Moderate assistance Where Assessed - Lower Body Bathing: Unsupported sit to stand Upper Body Dressing: Performed;Minimal assistance (don gown) Where Assessed - Upper Body Dressing: Unsupported sitting Lower Body Dressing: Simulated;Moderate assistance Where Assessed - Lower Body Dressing: Unsupported sit to stand Toilet Transfer: Simulated;Minimal assistance Toilet Transfer Method: Other (comment);Sit to stand Building services engineer) Science writer: Other (comment) Building services engineer) Toileting - Clothing Manipulation and Hygiene: Simulated;Minimal assistance Where Assessed - Best boy and Hygiene: Sit to stand from 3-in-1 or toilet Tub/Shower  Transfer Method: Not assessed Equipment Used: Rolling walker Transfers/Ambulation Related to ADLs: Min assist and min verbal cues for safety with RW in room around bed ADL Comments: Pt. with decreased activity tolerance with ADL tasks and provided with seated rest break during functional mobility in room for ~9mins.    OT Diagnosis: Generalized weakness  OT Problem List: Decreased strength;Decreased activity tolerance;Impaired balance (sitting and/or standing);Decreased safety awareness;Decreased knowledge of use of DME or AE;Cardiopulmonary status limiting activity OT Treatment Interventions: Self-care/ADL training;DME and/or AE instruction;Therapeutic activities;Patient/family education;Balance training   OT Goals Acute Rehab OT Goals OT Goal Formulation: With patient Time For Goal Achievement: 10/11/11 Potential to Achieve Goals: Good ADL Goals Pt Will Perform Grooming: with set-up;with supervision;Standing at sink ADL Goal: Grooming - Progress: Goal set today Pt Will Perform Upper Body Dressing: with set-up;with supervision;Standing ADL Goal: Upper Body Dressing - Progress: Goal set today Pt Will Perform Lower Body Dressing: with set-up;with supervision;Sit to stand from chair;with adaptive equipment ADL Goal: Lower Body Dressing - Progress: Goal set today Pt Will Transfer to Toilet: with supervision;with DME;3-in-1;Ambulation ADL Goal: Toilet Transfer - Progress: Goal set today Additional ADL Goal #1: Pt. will recall 3 energy conservation techniques with ADLs ADL Goal: Additional Goal #1 - Progress: Goal set today  Visit Information  Last OT Received On: 10/04/11 Assistance Needed: +1    Subjective Data  Subjective: "I had fallen recently" Patient Stated Goal: "Go home"   Prior Functioning  Home Living Lives With: Alone Available Help at Discharge: Personal care attendant Type of Home: House Home Access: Stairs to enter [employer] of Steps: 8 Entrance  Stairs-Rails: Right;Left;Can reach both Home Layout: One level Bathroom Shower/Tub: Product/process development scientist: Standard Bathroom Accessibility: Yes How Accessible: Accessible via walker Home Adaptive Equipment: Walker - rolling;Straight cane Prior Function Level of Independence: Independent with assistive device(s) Able to Take  Stairs?: Yes Driving: Yes Vocation: Retired Corporate investment banker: No difficulties Dominant Hand: Right    Cognition  Overall Cognitive Status: Appears within functional limits for tasks assessed/performed Arousal/Alertness: Lethargic Orientation Level: Oriented X4 / Intact Behavior During Session: Flat affect    Extremity/Trunk Assessment Right Upper Extremity Assessment RUE ROM/Strength/Tone: WFL for tasks assessed RUE Sensation: WFL - Light Touch RUE Coordination: WFL - gross/fine motor Left Upper Extremity Assessment LUE ROM/Strength/Tone: WFL for tasks assessed LUE Sensation: WFL - Light Touch LUE Coordination: WFL - gross/fine motor Right Lower Extremity Assessment RLE ROM/Strength/Tone: Deficits RLE ROM/Strength/Tone Deficits: Strength approximately 3/5 grossly.   Left Lower Extremity Assessment LLE ROM/Strength/Tone: Deficits LLE ROM/Strength/Tone Deficits: Strength approximately 3/5 grossly.  Trunk Assessment Trunk Assessment: Kyphotic   Mobility Bed Mobility Bed Mobility: Not assessed (Pt. in chair) Rolling Left: 5: Supervision Left Sidelying to Sit: 4: Min assist Sitting - Scoot to Edge of Bed: 4: Min assist Details for Bed Mobility Assistance: Assist to transition trunk from side to sitting with pt using bedrail secondary to weakness.  Transfers Transfers: Sit to Stand;Stand to Sit Sit to Stand: 4: Min assist;With upper extremity assist;From chair/3-in-1 Stand to Sit: 4: Min assist;To chair/3-in-1;With armrests;With upper extremity assist Details for Transfer Assistance: Mod verbal cues for hand placement and  technique to increase safety         End of Session OT - End of Session Equipment Utilized During Treatment: Gait belt Activity Tolerance: Patient tolerated treatment well Patient left: in chair;with call bell/phone within reach;with nursing in room Nurse Communication: Mobility status   Lavona Mound, OTR/L Pager [phone] 10/04/2011, 12:59 PM

## 2011-10-04 NOTE — Progress Notes (Signed)
Pt's niece in given the lists of nursing facility  To choose from

## 2011-10-04 NOTE — Progress Notes (Signed)
UR Completed. Llana Aliment, RN, Nurse Case Manager [phone]

## 2011-10-04 NOTE — Progress Notes (Addendum)
Clinical Social Work Department BRIEF PSYCHOSOCIAL ASSESSMENT 10/05/2011  Patient:  DAYLIN, VOGLER     Account Number:  [id number]     Admit date:  10/02/2011  Clinical Social Worker:  Lillette Boxer  Date/Time:  10/04/2011 02:00 PM  Referred by:  Physician  Date Referred:  10/04/2011 Referred for  SNF Placement   Other Referral:   Interview type:  Patient Other interview type:   CSW spoke with neice Abby over the phone.    PSYCHOSOCIAL DATA Living Status:  WIFE Admitted from facility:   Level of care:   Primary support name:  [name] Primary support relationship to patient:  FAMILY Degree of support available:   Good    CURRENT CONCERNS Current Concerns  Post-Acute Placement   Other Concerns:    SOCIAL WORK ASSESSMENT / PLAN CSW met with patient at bedside. CSW received a referral that patient would likely need SNF placement. CSW introduced self,  explained role, and discussed SNF placement. Patient was agreeable to placement but requested that the CSW speak with his Niece. CSW contacted patient's niece and she was agreeable to the disposition. Patient's niece also reported that patient's son and wife were out of town but would be up to see him soon. CSW will speak wiith them when they are available. CSW will start SNF placement search for the patient.   Assessment/plan status:  Psychosocial Support/Ongoing Assessment of Needs Other assessment/ plan:   Information/referral to community resources:   CSW left a facilities choice list with the paitent.    PATIENT'S/FAMILY'S RESPONSE TO PLAN OF CARE: Patient and patient's niece were very receptive to information and support provided by CSW. CSW will initiate SNF placement and continue to follow    Rhea Pink, MSW, [place]

## 2011-10-04 NOTE — Progress Notes (Signed)
Subjective: Still feeling weak. Reports swelling in lower extremities better. No other specific complaints.  Objective: Vital signs in last 24 hours: Filed Vitals:   10/03/11 1400 10/03/11 2100 10/04/11 0129 10/04/11 0515  BP: 124/67 168/80 157/63 150/68  Pulse: 100 106 95 91  Temp: 97.8 F (36.6 C) 98 F (36.7 C) 99.2 F (37.3 C) 98.7 F (37.1 C)  TempSrc: Oral Oral Oral Oral  Resp: 20 16 18 18   Height:      Weight:    158 lb 15.2 oz (72.1 kg)  SpO2: 92% 91% 96% 96%   Weight change: 1 lb 15.8 oz (0.9 kg)  Intake/Output Summary (Last 24 hours) at 10/04/11 0849 Last data filed at 10/04/11 0134  Gross per 24 hour  Intake    938 ml  Output   1725 ml  Net   -787 ml    Physical Exam: General: Awake, Oriented, No acute distress. HEENT: EOMI. Neck: Supple CV: S1 and S2 Lungs: Clear to ascultation bilaterally Abdomen: Soft, Nontender, Nondistended, +bowel sounds. Ext: Good pulses. 2+ LE edema at ankles, trace edema about mid shin.  Lab Results: Basic Metabolic Panel:  Lab [id number] 10/03/11 1420 10/03/11 0710 10/02/11 1938 10/01/11 1036  NA 124* 120* 124* 122* 126*  K 3.6 3.3* 3.6 3.7 4.1  CL 89* 87* 89* 85* 95*  CO2 22 20 18* 19 18*  GLUCOSE 147* 231* 328* 327* 313*  BUN 27* 28* 29* 30* 24*  CREATININE 1.36* 1.26 1.23 1.35 1.6*  CALCIUM 8.5 8.2* 8.5 8.9 7.9*  MG 1.9 -- -- -- --  PHOS -- -- -- -- --   Liver Function Tests:  Lab 10/02/11 1938  AST 34  ALT 30  ALKPHOS 110  BILITOT 1.2  PROT 7.7  ALBUMIN 2.3*   No results found for this basename: LIPASE:5,AMYLASE:5 in the last 168 hours No results found for this basename: AMMONIA:5 in the last 168 hours CBC:  Lab 10/04/11 0635 10/03/11 0710 10/02/11 1938  WBC 8.0 9.5 9.2  NEUTROABS -- -- 8.7*  HGB 10.8* 12.0* 11.4*  HCT 30.1* 33.1* 31.6*  MCV 87.2 88.0 89.8  PLT 242 239 206   Cardiac Enzymes: No results found for this basename: CKTOTAL:5,CKMB:5,CKMBINDEX:5,TROPONINI:5 in the last 168 hours BNP  (last 3 results)  Basename 10/02/11 1938 10/01/11 1036 03/30/11 1055  PROBNP 396.1 1962.0* 27.0   CBG:  Lab 10/03/11 2112 10/03/11 1609 10/03/11 1132 10/03/11 0630 10/03/11 0127  GLUCAP 172* 221* 206* 335* 307*    Basename 10/02/11 2309 10/01/11 1036  HGBA1C 11.9* 12.0*   Other Labs: No components found with this basename: POCBNP:3 No results found for this basename: DDIMER:2 in the last 168 hours No results found for this basename: CHOL:2,HDL:2,LDLCALC:2,TRIG:2,CHOLHDL:2,LDLDIRECT:2 in the last 168 hours  Lab 10/03/11 0710  TSH 0.892  T4TOTAL --  T3FREE --  FREET4 --  THYROIDAB --   No results found for this basename: VITAMINB12:2,FOLATE:2,FERRITIN:2,TIBC:2,IRON:2,RETICCTPCT:2 in the last 168 hours  Micro Results: No results found for this or any previous visit (from the past 240 hour(s)).  Studies/Results: Ct Abdomen Pelvis Wo Contrast  10/03/2011  *RADIOLOGY REPORT*  Clinical Data: Bilateral pedal edema.  History of chronic renal insufficiency, hypertension, diabetes and elevated PSA level.  CT ABDOMEN AND PELVIS WITHOUT CONTRAST  Technique:  Multidetector CT imaging of the abdomen and pelvis was performed following the standard protocol without intravenous contrast.  Comparison: No priors.  Findings:  Lung Bases: Extensive air space consolidation throughout the left lower lobe with  air bronchograms, concerning for pneumonia. Subsegmental atelectasis in the right lower lobe. There is a small hiatal hernia. Heart size is borderline enlarged.  Abdomen/Pelvis:  There are no abnormal calcifications within the collecting system of either kidney, along the course of either ureter, or within the lumen of the urinary bladder.  No hydroureteronephrosis to suggest urinary tract obstruction at this time.  The unenhanced appearance of the liver, pancreas, spleen and bilateral adrenal glands is unremarkable.  There is high attenuation material layering dependently within the lumen of the  gallbladder, likely representing biliary sludge.  However, the gallbladder does not appear distended, nor is there pericholecystic fluid or stranding at this time to suggest an acute cholecystitis.  There is a small amount of mesenteric edema and a small volume of ascites.  Atherosclerotic calcifications are noted throughout the abdominal and pelvic vasculature, without definite aneurysm. Postoperative changes of TURP are noted in the prostate gland. Urinary bladder is unremarkable in appearance.  No pneumoperitoneum.  No pathologic distension of bowel.  Normal appendix.  No definite pathologic lymphadenopathy within the abdomen or pelvis. Just beneath the right inguinal canal there is a 2.9 x 2.4 cm soft tissue attenuation lesion that is suspicious for an incompletely descended testicle.  Musculoskeletal: There are no aggressive appearing lytic or blastic lesions noted in the visualized portions of the skeleton.  IMPRESSION: 1.  Small amount of mesenteric edema and small volume of ascites. 2.  Biliary sludge layering dependently within the lumen of the gallbladder.  No signs of acute cholecystitis at this time. 3.  Normal appendix. 4.  Extensive left lower lobe airspace consolidation most compatible with pneumonia. 5.  Atherosclerosis.  6.  Small hiatal hernia. 7.  Soft tissue lesion just beneath the right inguinal canal suspicious for an incompletely distended testicle, as above.  Original Report Authenticated By: Etheleen Mayhew, M.D.   Dg Chest 2 View  10/03/2011  *RADIOLOGY REPORT*  Clinical Data: Bilateral leg swelling and pain for one week.  CHEST - 2 VIEW  Comparison: Chest radiograph performed 06/01/2011  Findings: The lungs are well-aerated.  Left lower lobe airspace opacification raises concern for pneumonia.  However, this does not appear to match the patient's clinical history; suggest clinical correlation and further evaluation as deemed appropriate.  There is no evidence of pleural effusion or  pneumothorax.  The heart is normal in size; the mediastinal contour is within normal limits.  No acute osseous abnormalities are seen.  IMPRESSION: Left lower lobe airspace opacity raises concern for pneumonia. However, this does not appear to match the patient's clinical history; suggest clinical correlation and further evaluation as deemed appropriate.  Original Report Authenticated By: Santa Lighter, M.D.    Medications: I have reviewed the patient's current medications. Scheduled Meds:    . aspirin EC  81 mg Oral Daily  . atorvastatin  20 mg Oral Daily  . calcitRIOL  0.25 mcg Oral Daily  . cyanocobalamin  1,000 mcg Intramuscular Q30 days  . furosemide  40 mg Intravenous BID  . heparin  5,000 Units Subcutaneous Q8H  . hydrALAZINE  25 mg Oral Q8H  . insulin aspart  0-9 Units Subcutaneous TID WC  . insulin aspart protamine-insulin aspart  25 Units Subcutaneous BID WC  . iohexol  20 mL Oral Q1 Hr x 2  . isosorbide mononitrate  10 mg Oral BID WC  . ofloxacin  1 drop Both Eyes Q4H while awake  . pantoprazole  40 mg Oral Q1200  . potassium chloride  40 mEq Oral Once  . prednisoLONE acetate  1 drop Both Eyes Daily  . sodium chloride  3 mL Intravenous Q12H   Continuous Infusions:  PRN Meds:.lactulose, traMADol  Assessment/Plan: Bilateral lower extremity edema likely due to decompensated acute on chronic diastolic heart failure 2-D echocardiogram on 04/21/2011 which showed systolic function was normal, ejection fraction 55-60%, wall motion was normal, there were normal no wall motion abnormalities. Continue diuresis with lasix, negative 850 cc so far.  DM (diabetes mellitus), type 1, uncontrolled w/ophthalmic complication NovoLog 0000000 25 units subcutaneous twice daily.  Continue SSI.  Generalized weakness Continue PT/OT.  Hyponatremia Likely due to acute on chronic diastolic heart failure.  Urine and plasma osmolality low.  Anemia Hemoglobin stable.  Maybe due to B12 deficiency  continue B12 injections.  Chronic kidney disease stage III Creatinine at baseline.  Hyperlipidemia Continue statin.  Prophylaxis Subcutaneous heparin.  Code status Full code  Disposition Pending improvement in edema, sodium, and PT evaluation.   LOS: 2 days  Dailin Sosnowski A, MD 10/04/2011, 8:49 AM

## 2011-10-04 NOTE — Evaluation (Signed)
Physical Therapy Evaluation Patient Details Name: Eric [name] MRN: [id number] DOB: 1923/05/10 Today's Date: 10/04/2011 Time: [id number] PT Time Calculation (min): 30 min  PT Assessment / Plan / Recommendation Clinical Impression  Pt is an 78 y/o male who lives alone admitted with LE edema and complication from hyponatremia.  Pt demonstrates need for assistance with mobility and presents as a significant falls risk. Acute PT to follow pt.  PT agrees that pt unable to safely return to living alone at this time and would recommend short term rehab prior to return to home.  SNF most appropriate setting for rehab.      PT Assessment  Patient needs continued PT services    Follow Up Recommendations  Skilled nursing facility;Supervision/Assistance - 24 hour    Barriers to Discharge Decreased caregiver support Pt lives alone with intemittent assist from aid (per RN)    lEquipment Recommendations  Defer to next venue    Recommendations for Other Services     Frequency Min 3X/week    Precautions / Restrictions Precautions Precautions: Fall Restrictions Weight Bearing Restrictions: No   Pertinent Vitals/Pain Pt c/o pain in bilateral feet (burning and aching)  9/10.  RN notified.  Pt reports history of intermittent pain in feet.      Mobility  Bed Mobility Bed Mobility: Rolling Left;Left Sidelying to Sit;Sitting - Scoot to Edge of Bed Rolling Left: 5: Supervision Left Sidelying to Sit: 4: Min assist Sitting - Scoot to Edge of Bed: 4: Min assist Details for Bed Mobility Assistance: Assist to transition trunk from side to sitting with pt using bedrail secondary to weakness.  Transfers Transfers: Sit to Stand;Stand to Sit Sit to Stand: 4: Min assist;From bed;With upper extremity assist Stand to Sit: 4: Min assist;To chair/3-in-1;With armrests;With upper extremity assist Details for Transfer Assistance: Assist to steady pt to stand secondary to LE weakness, Cues for hand placement and  safe technique for both standing and sitting.   Ambulation/Gait Ambulation/Gait Assistance: 4: Min guard Ambulation Distance (Feet): 10 Feet Assistive device: Rolling walker Ambulation/Gait Assistance Details: Pt's gait is very unsteady.  Pt taking small steps and unable to achieve full knee extension bilateraly throughout gait cycle.  Gait Pattern: Step-to pattern;Decreased stride length;Decreased step length - left;Decreased stance time - right;Right flexed knee in stance;Left flexed knee in stance;Shuffle Gait velocity: Appears Slow but did not measure.  Stairs: No    Exercises Total Joint Exercises Ankle Circles/Pumps: 10 reps;Both;Seated Quad Sets: Both;5 reps;Seated   PT Diagnosis: Difficulty walking;Abnormality of gait;Generalized weakness  PT Problem List: Decreased strength;Decreased activity tolerance;Decreased balance;Decreased mobility;Decreased knowledge of use of DME;Decreased safety awareness;Cardiopulmonary status limiting activity;Pain PT Treatment Interventions: DME instruction;Gait training;Functional mobility training;Stair training;Therapeutic activities;Therapeutic exercise;Neuromuscular re-education;Patient/family education   PT Goals Acute Rehab PT Goals PT Goal Formulation: With patient Time For Goal Achievement: 10/17/11 Potential to Achieve Goals: Good Pt will go Supine/Side to Sit: with modified independence PT Goal: Supine/Side to Sit - Progress: Goal set today Pt will Sit at Edge of Bed: Independently;6-10 min;with no upper extremity support PT Goal: Sit at Edge Of Bed - Progress: Goal set today Pt will go Sit to Supine/Side: Independently;with HOB 0 degrees PT Goal: Sit to Supine/Side - Progress: Goal set today Pt will go Sit to Stand: Independently;with upper extremity assist PT Goal: Sit to Stand - Progress: Goal set today Pt will go Stand to Sit: Independently PT Goal: Stand to Sit - Progress: Goal set today Pt will Transfer Bed to Chair/Chair to  Bed: Independently PT  Transfer Goal: Bed to Chair/Chair to Bed - Progress: Goal set today Pt will Ambulate: >150 feet;with modified independence;with cane PT Goal: Ambulate - Progress: Goal set today Pt will Go Up / Down Stairs: 6-9 stairs;with modified independence;with rail(s);with cane PT Goal: Up/Down Stairs - Progress: Goal set today  Visit Information  Last PT Received On: 10/04/11 Assistance Needed: +1    Subjective Data  Subjective: agree to PT eval.    Prior Functioning  Home Living Lives With: Alone Available Help at Discharge: Personal care attendant Type of Home: House Home Access: Stairs to enter Technical brewer of Steps: 8 Entrance Stairs-Rails: Right;Left;Can reach both Home Layout: One level Bathroom Shower/Tub: Product/process development scientist: Standard Bathroom Accessibility: Yes How Accessible: Accessible via walker Home Adaptive Equipment: Walker - rolling;Straight cane Prior Function Level of Independence: Independent with assistive device(s) Able to Take Stairs?: Yes Driving: Yes Vocation: Retired Corporate investment banker: No difficulties Dominant Hand: Right    Cognition  Overall Cognitive Status: Appears within functional limits for tasks assessed/performed Arousal/Alertness: Lethargic Orientation Level: Oriented X4 / Intact Behavior During Session: [facility] for tasks performed    Extremity/Trunk Assessment Right Upper Extremity Assessment RUE ROM/Strength/Tone: [facility] for tasks assessed Left Upper Extremity Assessment LUE ROM/Strength/Tone: WFL for tasks assessed Right Lower Extremity Assessment RLE ROM/Strength/Tone: Deficits RLE ROM/Strength/Tone Deficits: Strength approximately 3/5 grossly.   Left Lower Extremity Assessment LLE ROM/Strength/Tone: Deficits LLE ROM/Strength/Tone Deficits: Strength approximately 3/5 grossly.  Trunk Assessment Trunk Assessment: Kyphotic   Balance Balance Balance Assessed: Yes Static Sitting  Balance Static Sitting - Balance Support: Bilateral upper extremity supported Static Sitting - Level of Assistance: 4: Min assist Static Sitting - Comment/# of Minutes: Pt sat on EOB for several minutes with min assist to recover balance secondary to pt leaning to his left.  2 LOB episodes requiring assistance.   End of Session PT - End of Session Equipment Utilized During Treatment: Gait belt Activity Tolerance: Patient limited by fatigue Patient left: in chair;with call bell/phone within reach Nurse Communication: Mobility status   Eric [name] 10/04/2011, 11:50 AM Eric Salina L. Eric [name] DPT [phone]

## 2011-10-05 DIAGNOSIS — E1065 Type 1 diabetes mellitus with hyperglycemia: Secondary | ICD-10-CM

## 2011-10-05 DIAGNOSIS — N183 Chronic kidney disease, stage 3 (moderate): Secondary | ICD-10-CM

## 2011-10-05 DIAGNOSIS — E871 Hypo-osmolality and hyponatremia: Secondary | ICD-10-CM

## 2011-10-05 DIAGNOSIS — R609 Edema, unspecified: Secondary | ICD-10-CM

## 2011-10-05 LAB — BASIC METABOLIC PANEL
BUN: 26 mg/dL — ABNORMAL HIGH (ref 6–23)
Chloride: 90 mEq/L — ABNORMAL LOW (ref 96–112)
GFR calc Af Amer: 53 mL/min — ABNORMAL LOW (ref >90)
GFR calc non Af Amer: 45 mL/min — ABNORMAL LOW (ref >90)
Potassium: 3 mEq/L — ABNORMAL LOW (ref 3.5–5.1)
Sodium: 126 mEq/L — ABNORMAL LOW (ref 135–145)

## 2011-10-05 LAB — CBC
HCT: 28.6 % — ABNORMAL LOW (ref 39.0–52.0)
MCHC: 36.4 g/dL — ABNORMAL HIGH (ref 30.0–36.0)
RDW: 12.8 % (ref 11.5–15.5)
WBC: 7.7 10*3/uL (ref 4.0–10.5)

## 2011-10-05 LAB — GLUCOSE, CAPILLARY: Glucose-Capillary: 160 mg/dL — ABNORMAL HIGH (ref 70–99)

## 2011-10-05 MED ORDER — FUROSEMIDE 10 MG/ML IJ SOLN
80.0000 mg | Freq: Two times a day (BID) | INTRAMUSCULAR | Status: DC
  Administered 2011-10-05 – 2011-10-06 (×2): 80 mg via INTRAVENOUS
  Filled 2011-10-05 (×4): qty 8

## 2011-10-05 MED ORDER — POTASSIUM CHLORIDE CRYS ER 20 MEQ PO TBCR
40.0000 meq | EXTENDED_RELEASE_TABLET | Freq: Every day | ORAL | Status: DC
  Administered 2011-10-06 – 2011-10-07 (×2): 40 meq via ORAL
  Filled 2011-10-05: qty 2
  Filled 2011-10-05: qty 1
  Filled 2011-10-05 (×2): qty 2

## 2011-10-05 MED ORDER — POTASSIUM CHLORIDE CRYS ER 20 MEQ PO TBCR
60.0000 meq | EXTENDED_RELEASE_TABLET | ORAL | Status: AC
  Administered 2011-10-05: 60 meq via ORAL
  Filled 2011-10-05: qty 3

## 2011-10-05 NOTE — Plan of Care (Signed)
Problem: Phase I Progression Outcomes Goal: EF % per last Echo/documented,Core Reminder form on chart Outcome: Completed/Met Date Met:  10/05/11 EF 55-60% per ECHO performed on 04/21/11.

## 2011-10-05 NOTE — Progress Notes (Signed)
Page Md  On K level 3.0. MD ordered PO K

## 2011-10-05 NOTE — Progress Notes (Signed)
Subjective: Feeling better today. No specific concerns.  Objective: Vital signs in last 24 hours: Filed Vitals:   10/04/11 1421 10/04/11 2110 10/05/11 0622 10/05/11 0635  BP: 143/78 175/81 158/75   Pulse: 104 102 104   Temp: 99.3 F (37.4 C) 98.6 F (37 C) 99.8 F (37.7 C)   TempSrc: Oral Oral Oral   Resp: 16 16 18    Height:      Weight:   70.625 kg (155 lb 11.2 oz)   SpO2: 96% 95% 89% 90%   Weight change: -1.475 kg (-3 lb 4 oz)  Intake/Output Summary (Last 24 hours) at 10/05/11 0906 Last data filed at 10/05/11 0629  Gross per 24 hour  Intake    807 ml  Output   1400 ml  Net   -593 ml    Physical Exam: General: Awake, Oriented, No acute distress. HEENT: EOMI. Neck: Supple CV: S1 and S2 Lungs: Clear to ascultation bilaterally Abdomen: Soft, Nontender, Nondistended, +bowel sounds. Ext: Good pulses. 2+ LE edema at ankles, trace edema at mid shin.  Lab Results: Basic Metabolic Panel:  Lab [id number] 10/04/11 0635 10/03/11 1420 10/03/11 0710 10/02/11 1938  NA 126* 124* 120* 124* 122*  K 3.0* 3.6 3.3* 3.6 3.7  CL 90* 89* 87* 89* 85*  CO2 23 22 20  18* 19  GLUCOSE 186* 147* 231* 328* 327*  BUN 26* 27* 28* 29* 30*  CREATININE 1.37* 1.36* 1.26 1.23 1.35  CALCIUM 8.3* 8.5 8.2* 8.5 8.9  MG -- 1.9 -- -- --  PHOS -- -- -- -- --   Liver Function Tests:  Lab 10/02/11 1938  AST 34  ALT 30  ALKPHOS 110  BILITOT 1.2  PROT 7.7  ALBUMIN 2.3*   No results found for this basename: LIPASE:5,AMYLASE:5 in the last 168 hours No results found for this basename: AMMONIA:5 in the last 168 hours CBC:  Lab 10/05/11 0638 10/04/11 0635 10/03/11 0710 10/02/11 1938  WBC 7.7 8.0 9.5 9.2  NEUTROABS -- -- -- 8.7*  HGB 10.4* 10.8* 12.0* 11.4*  HCT 28.6* 30.1* 33.1* 31.6*  MCV 86.7 87.2 88.0 89.8  PLT 264 242 239 206   Cardiac Enzymes: No results found for this basename: CKTOTAL:5,CKMB:5,CKMBINDEX:5,TROPONINI:5 in the last 168 hours BNP (last 3 results)  Basename 10/02/11  1938 10/01/11 1036 03/30/11 1055  PROBNP 396.1 1962.0* 27.0   CBG:  Lab 10/05/11 0617 10/04/11 2103 10/04/11 1608 10/04/11 1146 10/04/11 0608  GLUCAP 160* 198* 163* 139* 151*    Basename 10/02/11 2309  HGBA1C 11.9*   Other Labs: No components found with this basename: POCBNP:3 No results found for this basename: DDIMER:2 in the last 168 hours No results found for this basename: CHOL:2,HDL:2,LDLCALC:2,TRIG:2,CHOLHDL:2,LDLDIRECT:2 in the last 168 hours  Lab 10/03/11 0710  TSH 0.892  T4TOTAL --  T3FREE --  FREET4 --  THYROIDAB --   No results found for this basename: VITAMINB12:2,FOLATE:2,FERRITIN:2,TIBC:2,IRON:2,RETICCTPCT:2 in the last 168 hours  Micro Results: No results found for this or any previous visit (from the past 240 hour(s)).  Studies/Results: Ct Abdomen Pelvis Wo Contrast  10/03/2011  *RADIOLOGY REPORT*  Clinical Data: Bilateral pedal edema.  History of chronic renal insufficiency, hypertension, diabetes and elevated PSA level.  CT ABDOMEN AND PELVIS WITHOUT CONTRAST  Technique:  Multidetector CT imaging of the abdomen and pelvis was performed following the standard protocol without intravenous contrast.  Comparison: No priors.  Findings:  Lung Bases: Extensive air space consolidation throughout the left lower lobe with air bronchograms, concerning for pneumonia.  Subsegmental atelectasis in the right lower lobe. There is a small hiatal hernia. Heart size is borderline enlarged.  Abdomen/Pelvis:  There are no abnormal calcifications within the collecting system of either kidney, along the course of either ureter, or within the lumen of the urinary bladder.  No hydroureteronephrosis to suggest urinary tract obstruction at this time.  The unenhanced appearance of the liver, pancreas, spleen and bilateral adrenal glands is unremarkable.  There is high attenuation material layering dependently within the lumen of the gallbladder, likely representing biliary sludge.  However, the  gallbladder does not appear distended, nor is there pericholecystic fluid or stranding at this time to suggest an acute cholecystitis.  There is a small amount of mesenteric edema and a small volume of ascites.  Atherosclerotic calcifications are noted throughout the abdominal and pelvic vasculature, without definite aneurysm. Postoperative changes of TURP are noted in the prostate gland. Urinary bladder is unremarkable in appearance.  No pneumoperitoneum.  No pathologic distension of bowel.  Normal appendix.  No definite pathologic lymphadenopathy within the abdomen or pelvis. Just beneath the right inguinal canal there is a 2.9 x 2.4 cm soft tissue attenuation lesion that is suspicious for an incompletely descended testicle.  Musculoskeletal: There are no aggressive appearing lytic or blastic lesions noted in the visualized portions of the skeleton.  IMPRESSION: 1.  Small amount of mesenteric edema and small volume of ascites. 2.  Biliary sludge layering dependently within the lumen of the gallbladder.  No signs of acute cholecystitis at this time. 3.  Normal appendix. 4.  Extensive left lower lobe airspace consolidation most compatible with pneumonia. 5.  Atherosclerosis.  6.  Small hiatal hernia. 7.  Soft tissue lesion just beneath the right inguinal canal suspicious for an incompletely distended testicle, as above.  Original Report Authenticated By: Etheleen Mayhew, M.D.    Medications: I have reviewed the patient's current medications. Scheduled Meds:    . aspirin EC  81 mg Oral Daily  . atorvastatin  20 mg Oral Daily  . calcitRIOL  0.25 mcg Oral Daily  . cyanocobalamin  1,000 mcg Intramuscular Q30 days  . furosemide  40 mg Intravenous BID  . heparin  5,000 Units Subcutaneous Q8H  . hydrALAZINE  25 mg Oral Q8H  . insulin aspart  0-9 Units Subcutaneous TID WC  . insulin aspart protamine-insulin aspart  25 Units Subcutaneous BID WC  . isosorbide mononitrate  10 mg Oral BID WC  . ofloxacin  1  drop Both Eyes Q4H while awake  . pantoprazole  40 mg Oral Q1200  . potassium chloride  60 mEq Oral Q4H  . prednisoLONE acetate  1 drop Both Eyes Daily  . senna-docusate  1 tablet Oral BID  . sodium chloride  3 mL Intravenous Q12H   Continuous Infusions:  PRN Meds:.lactulose, polyethylene glycol, traMADol  Assessment/Plan: Bilateral lower extremity edema likely due to decompensated acute on chronic diastolic heart failure 2-D echocardiogram on 04/21/2011 which showed systolic function was normal, ejection fraction 55-60%, wall motion was normal, there were normal no wall motion abnormalities. Continue diuresis with lasix, -1.5 L so far.  DM (diabetes mellitus), type 1, uncontrolled w/ophthalmic complication NovoLog 0000000 25 units subcutaneous twice daily.  Continue SSI. Stable.  Generalized weakness Continue PT/OT recommending SNF.  Hyponatremia Slightly improved, likely due to acute on chronic diastolic heart failure.  Urine and plasma osmolality low. Continue diuresis with lasix. Renal consultation appreciated.   Anemia Hemoglobin stable.  Maybe due to B12 deficiency continue B12 injections.  Chronic kidney disease stage III Creatinine at baseline.  Hyperlipidemia Continue statin.  Prophylaxis Subcutaneous heparin.  Code status Full code  Disposition Pending SNF placement, and improvement in edema and sodium.   LOS: 3 days  Chantrice Hagg A, MD 10/05/2011, 9:06 AM

## 2011-10-05 NOTE — Progress Notes (Signed)
Physical Therapy Treatment Patient Details Name: Eric [name] MRN: [id number] DOB: 11-Jan-1924 Today's Date: 10/05/2011 Time: [id number] PT Time Calculation (min): 28 min  PT Assessment / Plan / Recommendation Comments on Treatment Session  Pt being assessed by 2 RNs on arrival, stating pt was more lethargic and they had called MD to report. MD felt this was due to hyponatremia. Despite low K+ (pt receiving Lasix), decided to attempt PT to arouse pt. Noted prior to initiating activity, pt SaO2 89-90% on 2L O2. RN made aware and reported this has been pt's baseline today. Performed seated exercises and noted SaO2 decr to 87% even with attempts at pursed lip breathing (pt with difficulty with technique).  Incr O2 to 3L with SaO2 93-94% at rest and again decr to 86% with activity (standing with RW). Further activity deferred, RN made aware and discussed that I noted pt had not used O2 the previous 2 days with SaO2 95%.    Follow Up Recommendations  Skilled nursing facility;Supervision/Assistance - 24 hour    Barriers to Discharge        Equipment Recommendations  Defer to next venue    Recommendations for Other Services    Frequency Min 3X/week   Plan Discharge plan remains appropriate;Frequency remains appropriate    Precautions / Restrictions Precautions Precautions: Fall   Pertinent Vitals/Pain SaO2 89-90% at rest on 2L; with activity, further decr to 87% on 2L and required 45 seconds to return to 90%; attempted incr O2 to 3L with SaO2 at rest 93-94%, however decr to 86% with standing. Discussed with RN and returned O2 to 2L at rest with SaO2 93%.    Mobility  Bed Mobility Bed Mobility: Not assessed Transfers Transfers: Sit to Stand;Stand to Sit Sit to Stand: 4: Min assist;With upper extremity assist;With armrests;From chair/3-in-1 Stand to Sit: 4: Min assist;With upper extremity assist;With armrests;To chair/3-in-1 Details for Transfer Assistance: Cues for sequencing (scoot  forward, feet underneath) and physical assist due to LE weakness Ambulation/Gait Ambulation/Gait Assistance: Not tested (comment) (O2 sats decr to 86% with standing even with O2 incr to 3L)    Exercises General Exercises - Lower Extremity Long Arc Quad: AROM;Both;10 reps;Seated Hip Flexion/Marching: AROM;Both;10 reps;Seated Toe Raises: AROM;Both;10 reps;Seated Heel Raises: AROM;Both;10 reps;Seated   PT Diagnosis:    PT Problem List:   PT Treatment Interventions:     PT Goals Acute Rehab PT Goals PT Goal: Sit to Stand - Progress: Progressing toward goal PT Goal: Stand to Sit - Progress: Progressing toward goal PT Goal: Ambulate - Progress: Not progressing  Visit Information  Last PT Received On: 10/05/11 Assistance Needed: +1    Subjective Data  Subjective: Denies use of home O2 Patient Stated Goal: Agrees to incr his strength   Cognition  Overall Cognitive Status: Impaired Area of Impairment: Attention;Memory Arousal/Alertness: Lethargic Orientation Level: Disoriented to;Place;Time;Situation Behavior During Session: Lethargic Current Attention Level: Sustained Attention - Other Comments: up to 20 seconds and then begins to fall asleep (even when on phone with his niece)    Programme researcher, broadcasting/film/video Balance Assessed: Yes Static Standing Balance Static Standing - Balance Support: Bilateral upper extremity supported Static Standing - Level of Assistance: 4: Min assist  End of Session PT - End of Session Equipment Utilized During Treatment: Gait belt;Oxygen Activity Tolerance: Treatment limited secondary to medical complications (Comment) (decr SaO2 despite pursed lip breathing and increasing O2 ) Patient left: in chair;with call bell/phone within reach Nurse Communication: Mobility status;Other (comment) (SaO2 levels in comparison to previous dates)  Stevee Valenta 10/05/2011, 2:40 PM Pager [phone]

## 2011-10-05 NOTE — Progress Notes (Signed)
Pt has a temp of 99.5, BP-135/60, HR-109. Pt is confused and very sleepy today. Dr Reece Levy notified, no new orders given. Will continue to monitor.

## 2011-10-05 NOTE — Progress Notes (Addendum)
Clinical Social Work Department CLINICAL SOCIAL WORK PLACEMENT NOTE 10/05/2011  Patient:  NORMAL, GRESS  Account Number:  [id number] Admit date:  10/02/2011  Clinical Social Worker:  Sahara Fujimoto Katherine Roan  Date/time:  10/04/2011 02:00 PM  Clinical Social Work is seeking post-discharge placement for this patient at the following level of care:   [place]   (*CSW will update this form in Epic as items are completed)   10/04/2011  Patient/family provided with [facility] Department of Clinical Social Work's list of facilities offering this level of care within the geographic area requested by the patient (or if unable, by the patient's family).  10/04/2011  Patient/family informed of their freedom to choose among providers that offer the needed level of care, that participate in Medicare, Medicaid or managed care program needed by the patient, have an available bed and are willing to accept the patient.  10/04/2011  Patient/family informed of [facility]' ownership interest in [facility], as well as of the fact that they are under no obligation to receive care at this facility.  PASARR submitted to EDS on 10/05/2011 PASARR number received from EDS on 10/05/2011  FL2 transmitted to all facilities in geographic area requested by pt/family on  10/04/2011 FL2 transmitted to all facilities within larger geographic area on   Patient informed that his/her managed care company has contracts with or will negotiate with  certain facilities, including the following:     Patient/family informed of bed offers received:  10/06/11 Patient chooses bed at [facility] Physician recommends and patient chooses bed at  [facility]  Patient to be transferred to  on  10/28/11 Patient to be transferred to facility by [facility]  The following physician request were entered in Epic:   Additional Comments:  Rhea Pink, MSW, [place]

## 2011-10-05 NOTE — Consult Note (Signed)
Reason for Consult: Hyponatremia Referring Physician: Dr. Oretha [name] Meyn is an 78 y.o. male with past medical history significant for hypertension, diabetes mellitus, elevated PSA ,  diastolic dysfunction as well as chronic kidney disease stage III. He was in his usual state of health until he developed lower extremity edema and feeling weak, he says for the last several months. He presented to the urgent care clinic with these complaints. Evaluation found him to have [name] of 122 and that was the reason for admission. It seems that patient has hyperkalemia hyponatremia. His urine osmolality is slightly inappropriately elevated in the 300s. So far today he's been treated with progress in my and sodium has improved from 120-126.   It also seems in the last 6 months that he's had chronic hyponatremia with sodiums at best in the low 130s. It is unclear but he may have been on Celexa which is now been discontinued.  TSH is within normal limits and serum cortisol is pending. Urine sodium was 20. History taking is slightly difficult as patient seems [name] little confused.   Trend in Creatinine: Creatinine, Ser  Date/Time Value Range Status  10/05/2011  6:38 AM 1.37* 0.50 - 1.35 mg/dL Final  10/04/2011  6:35 AM 1.36* 0.50 - 1.35 mg/dL Final  10/03/2011  2:20 PM 1.26  0.50 - 1.35 mg/dL Final  10/03/2011  7:10 AM 1.23  0.50 - 1.35 mg/dL Final  10/02/2011  7:38 PM 1.35  0.50 - 1.35 mg/dL Final  10/01/2011 10:36 AM 1.6* 0.4 - 1.5 mg/dL Final  05/30/2011  5:32 PM 1.20  0.50 - 1.35 mg/dL Final  04/23/2011  4:40 AM 1.36* 0.50 - 1.35 mg/dL Final  04/22/2011  4:17 AM 1.31  0.50 - 1.35 mg/dL Final  04/21/2011  5:00 AM 1.45* 0.50 - 1.35 mg/dL Final  04/20/2011  4:40 AM 1.36* 0.50 - 1.35 mg/dL Final  04/19/2011 12:15 PM 1.32  0.50 - 1.35 mg/dL Final  04/19/2011  8:29 AM 1.48* 0.50 - 1.35 mg/dL Final  03/30/2011 10:55 AM 1.4  0.4 - 1.5 mg/dL Final  01/10/2011 11:42 PM 1.18  0.50 - 1.35 mg/dL Final  12/09/2009  8:35 AM 1.1   0.4-1.5 mg/dL Final  02/09/2009  1:46 AM 1.30  0.4 - 1.5 mg/dL Final  12/25/2008  4:17 PM 1.4  0.4-1.5 mg/dL Final  10/03/2008  2:08 PM 1.3  0.4-1.5 mg/dL Final  12/26/2007 12:00 AM 1.7* 0.4-1.5 mg/dL Final  10/28/2006 10:12 AM 1.5  0.4-1.5 mg/dL Final   Sodium  Date/Time Value Range Status  10/05/2011  6:38 AM 126* 135 - 145 mEq/L Final  10/04/2011  6:35 AM 124* 135 - 145 mEq/L Final  10/03/2011  2:20 PM 120* 135 - 145 mEq/L Final  10/03/2011  7:10 AM 124* 135 - 145 mEq/L Final  10/02/2011  7:38 PM 122* 135 - 145 mEq/L Final  10/01/2011 10:36 AM 126* 135 - 145 mEq/L Final  05/30/2011  5:32 PM 134* 135 - 145 mEq/L Final  04/23/2011  4:40 AM 132* 135 - 145 mEq/L Final  04/22/2011  4:17 AM 133* 135 - 145 mEq/L Final  04/21/2011  5:00 AM 130* 135 - 145 mEq/L Final  04/20/2011  4:40 AM 130* 135 - 145 mEq/L Final  04/19/2011  8:29 AM 128* 135 - 145 mEq/L Final  03/30/2011 10:55 AM 134* 135 - 145 mEq/L Final  01/10/2011 11:42 PM 136  135 - 145 mEq/L Final  12/09/2009  8:35 AM 138  135-145 meq/L Final  02/09/2009  1:46 AM 130* 135 - 145 mEq/L Final  12/25/2008  4:17 PM 135  135-145 meq/L Final  10/03/2008  2:08 PM 138  135-145 meq/L Final  12/26/2007 12:00 AM 142  135-145 meq/L Final  10/28/2006 10:12 AM 138  135-145 meq/L Final   PMH:   Past Medical History  Diagnosis Date  . ANEMIA-NOS 02/17/2007  . DEPRESSION 12/25/2008  . HYPERTENSION 02/17/2007  . GERD 02/17/2007  . OSTEOARTHRITIS 03/04/2010  . DIABETES MELLITUS, TYPE I, CONTROLLED, WITH RETINOPATHY   . Gastroparesis   . BENIGN PROSTATIC HYPERTROPHY   . VITAMIN B12 DEFICIENCY   . BACK PAIN, LUMBAR   . LIVER DISORDER   . PSA, INCREASED   . Hyperlipidemia   . Diabetes mellitus   . Pneumonia   . Anxiety     PSH:   Past Surgical History  Procedure Date  . Transurethral resection of prostate     Allergies: No Known Allergies  Medications:   Prior to Admission medications   Medication Sig Start Date End Date Taking? Authorizing Provider    acetaminophen (TYLENOL) 500 MG tablet Take 1,000 mg by mouth every 6 (six) hours as needed. For pain.    Yes Historical Provider, MD  aspirin 81 MG tablet Take 81 mg by mouth daily.     Yes Historical Provider, MD  atorvastatin (LIPITOR) 20 MG tablet Take 1 tablet (20 mg total) by mouth daily. 09/28/11  Yes Renato Shin, MD  BAYER CONTOUR TEST test strip USE TWICE DAILY 03/31/11  Yes Renato Shin, MD  BAYER CONTOUR TEST test strip USE TO TEST TWICE DAILY 06/25/11  Yes Renato Shin, MD  calcitRIOL (ROCALTROL) 0.25 MCG capsule Take 1 capsule (0.25 mcg total) by mouth daily. 10/02/11 10/01/12 Yes Renato Shin, MD  clotrimazole (LOTRIMIN) 1 % cream Apply 1 application topically 2 (two) times daily. For irritation. 09/03/11  Yes Renato Shin, MD  cyanocobalamin (,VITAMIN B-12,) 1000 MCG/ML injection Inject 1,000 mcg into the muscle every 30 (thirty) days.   Yes Historical Provider, MD  furosemide (LASIX) 80 MG tablet Take 80 mg by mouth daily. 04/24/11  Yes Simbiso Ranga, MD  hydrALAZINE (APRESOLINE) 25 MG tablet Take 1 tablet (25 mg total) by mouth 3 (three) times daily. 07/29/11 07/28/12 Yes Renato Shin, MD  insulin NPH-insulin regular (NOVOLIN 70/30) (70-30) 100 UNIT/ML injection Inject 18-25 Units into the skin 2 (two) times daily with [name] meal. 18 units with breakfast, and 25 units with the evening meal 06/11/11  Yes Renato Shin, MD  Insulin Syringe-Needle U-100 31G X 5/16" 0.3 ML MISC Use as directed twice [name] day 11/27/10  Yes Renato Shin, MD  isosorbide mononitrate (ISMO,MONOKET) 10 MG tablet Take 1 tablet (10 mg total) by mouth 2 (two) times daily. 09/28/11 09/27/12 Yes Renato Shin, MD  lactulose (CHRONULAC) 10 GM/15ML solution Take 15 ml by mouth twice [name] day as needed    Yes Historical Provider, MD  losartan (COZAAR) 100 MG tablet Take 1 tablet (100 mg total) by mouth daily. 09/28/11  Yes Renato Shin, MD  omeprazole (PRILOSEC) 40 MG capsule Take 1 capsule (40 mg total) by mouth daily. 09/28/11 09/27/12 Yes  Renato Shin, MD  prednisoLONE acetate (PRED FORTE) 1 % ophthalmic suspension Place 1 drop into both eyes daily. 08/09/11  Yes Historical Provider, MD  nitroGLYCERIN (NITROSTAT) 0.4 MG SL tablet Place 1 tablet (0.4 mg total) under the tongue every 5 (five) minutes as needed for chest pain. 04/24/11 04/23/12  Simbiso Ranga, MD  ofloxacin (OCUFLOX) 0.3 %  ophthalmic solution Take 1 tablet by mouth daily. 08/09/11   Historical Provider, MD    Discontinued Meds:   Medications Discontinued During This Encounter  Medication Reason  . carvedilol (COREG) 12.5 MG tablet Discontinued by provider  . citalopram (CELEXA) 20 MG tablet Discontinued by provider  . dextrose 5 % and 0.9 % NaCl with KCl 20 mEq/L infusion   . insulin aspart protamine-insulin aspart (NOVOLOG 70/30) injection 20 Units   . 0.9 %  sodium chloride infusion   . insulin aspart protamine-insulin aspart (NOVOLOG 70/30) injection 8 Units   . insulin aspart protamine-insulin aspart (NOVOLOG 70/30) injection 28 Units     Social History:  reports that he has never smoked. He has never used smokeless tobacco. He reports that he does not drink alcohol or use illicit drugs.  Family History:   Family History  Problem Relation Age of Onset  . Cancer Father     had uncertain type of cancer  . Cancer Brother     Prostate Cancer  . Cancer Brother     Prostate Cancer    Review of systems: Patient had lower extremity edema for the last 2-3 months. He has had in the past as well but does have periods of time when he does not have it. He denies any orthopnea, PND, or dyspnea on exertion. He denies chest discomfort. He denies fevers, chills, night sweats. He denies cough or hemoptysis. He states that his appetite has not been great but he denies any nausea, vomiting. He denies any hematuria, dysuria or excessive foaminess to the urine. The remainder of her review systems is negative.   Blood pressure 159/67, pulse 90, temperature 99.8 F (37.7 C),  temperature source Oral, resp. rate 18, height 5\' 10"  (1.778 m), weight 70.625 kg (155 lb 11.2 oz), SpO2 91.00%. In general: [name] thin black male who is alert and eating lunch at this time. He is somewhat slow to respond but was able to answer my orientation questions correctly. HEENT: Pupils are equal round reactive to light symmetrical motions are intact, mucous members are moist. Neck: There is no jugular venous distention, carotid bruits or lymphadenopathy. Lungs: Decreased breath sounds at bilateral bases. Abdomen: Soft, nontender, nondistended. There is no pedal splenomegaly and no abnormal bruits are appreciated. Extremities: There is 1-2+ pitting edema just from the mid shin down. Neuro: Patient has some psychomotor retardation but is able to answer my orientation questions. He has some difficulty initiating his pills and his drink. In  Labs: Basic Metabolic Panel:  Lab [id number] 10/04/11 0635 10/03/11 1420 10/03/11 0710 10/02/11 1938 10/01/11 1036  NA 126* 124* 120* 124* 122* 126*  K 3.0* 3.6 3.3* 3.6 3.7 4.1  CL 90* 89* 87* 89* 85* 95*  CO2 23 22 20  18* 19 18*  GLUCOSE 186* 147* 231* 328* 327* 313*  BUN 26* 27* 28* 29* 30* 24*  CREATININE 1.37* 1.36* 1.26 1.23 1.35 1.6*  ALBUMIN -- -- -- -- 2.3* --  CALCIUM 8.3* 8.5 8.2* 8.5 8.9 7.9*  PHOS -- -- -- -- -- --   Liver Function Tests:  Lab 10/02/11 1938  AST 34  ALT 30  ALKPHOS 110  BILITOT 1.2  PROT 7.7  ALBUMIN 2.3*   No results found for this basename: LIPASE:3,AMYLASE:3 in the last 168 hours No results found for this basename: AMMONIA:3 in the last 168 hours CBC:  Lab 10/05/11 0638 10/04/11 0635 10/03/11 0710 10/02/11 1938  WBC 7.7 8.0 9.5 9.2  NEUTROABS -- -- --  8.7*  HGB 10.4* 10.8* 12.0* 11.4*  HCT 28.6* 30.1* 33.1* 31.6*  MCV 86.7 87.2 88.0 89.8  PLT 264 242 239 206      Iron Studies: No results found for this basename: IRON:30,TIBC:30,TRANSFERRIN:30,FERRITIN:30 in the last 168 hours  TSH is within normal  limits, urine sodium was 20, urine osmolality is 335 which is [name] little bit inappropriately increased  Xrays/Other Studies: No results found.   Assessment/Plan: 78 year old black male with [name] subacute history of hyponatremia. Right at this time appears to have hypervolemic hyponatremia but also may have some features of SIADH.    #1 Hyponatremia: As above, probably has an element of hypervolemic hyponatremia which would be appropriately treated with Lasix. Given his elevated urinary osmolality he also probably has features of SIADH. Celexa can cause SIADH so I agree with the discontinuation of this medication. Serum cortisol is pending. I also agree with 1500 cc fluid restriction.  My only change  today is to increase his Lasix slightly. Other than that not sure anything needs to be done. His baseline sodium lately at best is been in the low 130s so I would not expect for his sodium to completely normalize. If we fail to see any improvement in the next couple of days we could add demeclocycline.   #2 Hypokalemia: I agree with repletion and he will likely need more as I'm increasing his Lasix as well. #3 Chronic kidney disease: Could given [name] propensity towards volume overload. He will likely need chronic Lasix therapy. #4 Anemia: Likely secondary to hospitalization/frequent blood draws/chronic kidney disease. No intervention is needed at this time.  Thank for this consultation we will continue to follow with you  Eric [name] 10/05/2011, 12:09 PM

## 2011-10-06 ENCOUNTER — Inpatient Hospital Stay (HOSPITAL_COMMUNITY): Payer: Medicare Other

## 2011-10-06 DIAGNOSIS — D696 Thrombocytopenia, unspecified: Secondary | ICD-10-CM

## 2011-10-06 DIAGNOSIS — E1065 Type 1 diabetes mellitus with hyperglycemia: Secondary | ICD-10-CM

## 2011-10-06 DIAGNOSIS — R609 Edema, unspecified: Secondary | ICD-10-CM

## 2011-10-06 DIAGNOSIS — E871 Hypo-osmolality and hyponatremia: Secondary | ICD-10-CM

## 2011-10-06 LAB — BLOOD GAS, ARTERIAL
Acid-Base Excess: 1.8 mmol/L (ref 0.0–2.0)
Drawn by: 32470
O2 Content: 2 L/min
pCO2 arterial: 29.1 mmHg — ABNORMAL LOW (ref 35.0–45.0)

## 2011-10-06 LAB — BASIC METABOLIC PANEL
BUN: 27 mg/dL — ABNORMAL HIGH (ref 6–23)
CO2: 23 mEq/L (ref 19–32)
Calcium: 8.2 mg/dL — ABNORMAL LOW (ref 8.4–10.5)
Chloride: 88 mEq/L — ABNORMAL LOW (ref 96–112)
Creatinine, Ser: 1.47 mg/dL — ABNORMAL HIGH (ref 0.50–1.35)
Glucose, Bld: 142 mg/dL — ABNORMAL HIGH (ref 70–99)

## 2011-10-06 LAB — CORTISOL-AM, BLOOD: Cortisol - AM: 41.6 ug/dL — ABNORMAL HIGH (ref 4.3–22.4)

## 2011-10-06 LAB — GLUCOSE, CAPILLARY
Glucose-Capillary: 94 mg/dL (ref 70–99)
Glucose-Capillary: 139 mg/dL — ABNORMAL HIGH (ref 70–99)
Glucose-Capillary: 162 mg/dL — ABNORMAL HIGH (ref 70–99)

## 2011-10-06 LAB — CBC
HCT: 25.7 % — ABNORMAL LOW (ref 39.0–52.0)
MCH: 31.6 pg (ref 26.0–34.0)
MCHC: 36.2 g/dL — ABNORMAL HIGH (ref 30.0–36.0)
MCV: 87.4 fL (ref 78.0–100.0)
Platelets: 280 10*3/uL (ref 150–400)
RDW: 13.2 % (ref 11.5–15.5)
WBC: 6.3 10*3/uL (ref 4.0–10.5)

## 2011-10-06 LAB — AMMONIA: Ammonia: 22 umol/L (ref 11–60)

## 2011-10-06 MED ORDER — LEVOFLOXACIN IN D5W 500 MG/100ML IV SOLN: 500.0000 mg | INTRAVENOUS | Status: DC

## 2011-10-06 MED ORDER — ACETAMINOPHEN 325 MG PO TABS
650.0000 mg | ORAL_TABLET | ORAL | Status: DC | PRN
  Administered 2011-10-06 – 2011-10-13 (×3): 650 mg via ORAL
  Filled 2011-10-06 (×3): qty 2

## 2011-10-06 MED ORDER — FUROSEMIDE 10 MG/ML IJ SOLN
80.0000 mg | Freq: Four times a day (QID) | INTRAMUSCULAR | Status: DC
  Administered 2011-10-06 – 2011-10-08 (×8): 80 mg via INTRAVENOUS
  Filled 2011-10-06 (×11): qty 8

## 2011-10-06 MED ORDER — LEVOFLOXACIN IN D5W 250 MG/50ML IV SOLN
250.0000 mg | INTRAVENOUS | Status: DC
  Administered 2011-10-06 – 2011-10-07 (×2): 250 mg via INTRAVENOUS
  Filled 2011-10-06 (×3): qty 50

## 2011-10-06 MED ORDER — POTASSIUM CHLORIDE CRYS ER 20 MEQ PO TBCR
40.0000 meq | EXTENDED_RELEASE_TABLET | Freq: Once | ORAL | Status: AC
  Administered 2011-10-06: 40 meq via ORAL

## 2011-10-06 NOTE — Progress Notes (Addendum)
87Page MD on sustained HR 120-130s  VS at 1435 bp 158/72 O2 87%. Move to 4L still at  87% RR 26. MD requested EKG. Page MD result.  Pt states they feel fine.

## 2011-10-06 NOTE — Progress Notes (Signed)
Subjective:  Still somewhat mentally slow.  No complaints Objective Vital signs in last 24 hours: Filed Vitals:   10/05/11 2107 10/06/11 0426 10/06/11 0655 10/06/11 0946  BP: 180/73 171/77 104/64 128/66  Pulse: 119 117 106 94  Temp: 99.1 F (37.3 C) 100.3 F (37.9 C)    TempSrc: Oral Oral    Resp: 18 16    Height:      Weight:  69.8 kg (153 lb 14.1 oz)    SpO2: 92% 91%     Weight change: -0.825 kg (-1 lb 13.1 oz)  Intake/Output Summary (Last 24 hours) at 10/06/11 1034 Last data filed at 10/06/11 0900  Gross per 24 hour  Intake    723 ml  Output   1050 ml  Net   -327 ml   Labs: Basic Metabolic Panel:  Lab 0000000 0620 10/05/11 0638 10/04/11 0635  NA 125* 126* 124*  K 3.2* 3.0* 3.6  CL 88* 90* 89*  CO2 23 23 22   GLUCOSE 142* 186* 147*  BUN 27* 26* 27*  CREATININE 1.47* 1.37* 1.36*  CALCIUM 8.2* 8.3* 8.5  ALB -- -- --  PHOS -- -- --   Liver Function Tests:  Lab 10/02/11 1938  AST 34  ALT 30  ALKPHOS 110  BILITOT 1.2  PROT 7.7  ALBUMIN 2.3*   No results found for this basename: LIPASE:3,AMYLASE:3 in the last 168 hours  Lab 10/06/11 0927  AMMONIA 22   CBC:  Lab 10/06/11 0620 10/05/11 0638 10/04/11 0635 10/03/11 0710 10/02/11 1938  WBC 6.3 7.7 8.0 -- --  NEUTROABS -- -- -- -- 8.7*  HGB 9.3* 10.4* 10.8* -- --  HCT 25.7* 28.6* 30.1* -- --  MCV 87.4 86.7 87.2 88.0 89.8  PLT 280 264 242 -- --   Cardiac Enzymes: No results found for this basename: CKTOTAL:5,CKMB:5,CKMBINDEX:5,TROPONINI:5 in the last 168 hours CBG:  Lab 10/06/11 0610 10/05/11 2111 10/05/11 1557 10/05/11 1129 10/05/11 0617  GLUCAP 147* 143* 143* 82 160*    Iron Studies: No results found for this basename: IRON,TIBC,TRANSFERRIN,FERRITIN in the last 72 hours Studies/Results: No results found. Medications: Infusions:    Scheduled Medications:    . aspirin EC  81 mg Oral Daily  . atorvastatin  20 mg Oral Daily  . calcitRIOL  0.25 mcg Oral Daily  . cyanocobalamin  1,000 mcg  Intramuscular Q30 days  . furosemide  80 mg Intravenous BID  . heparin  5,000 Units Subcutaneous Q8H  . hydrALAZINE  25 mg Oral Q8H  . insulin aspart  0-9 Units Subcutaneous TID WC  . insulin aspart protamine-insulin aspart  25 Units Subcutaneous BID WC  . isosorbide mononitrate  10 mg Oral BID WC  . levofloxacin (LEVAQUIN) IV  250 mg Intravenous Q24H  . ofloxacin  1 drop Both Eyes Q4H while awake  . pantoprazole  40 mg Oral Q1200  . potassium chloride  40 mEq Oral Daily  . prednisoLONE acetate  1 drop Both Eyes Daily  . senna-docusate  1 tablet Oral BID  . sodium chloride  3 mL Intravenous Q12H  . DISCONTD: furosemide  40 mg Intravenous BID  . DISCONTD: levofloxacin (LEVAQUIN) IV  500 mg Intravenous Q24H    have reviewed scheduled and prn medications.  Physical Exam: General: alert, but slow to answer questions Heart: RRR Lungs: mostly clear Abdomen: soft,, non tender Extremities: still with pitting edema   I Assessment/ Plan: Pt is a 78 y.o. yo male who was admitted on 10/02/2011 with edema and  hyponatremia  Assessment/Plan: #1 Hyponatremia:  Stable. has an element of hypervolemic hyponatremia which is being appropriately treated with Lasix. Given his elevated urinary osmolality he also has features of SIADH. Celexa can cause SIADH so I agree with the discontinuation of this medication. Serum cortisol is WNL. I also agree with 1500 cc fluid restriction. My only change today is to increase his Lasix again slightly given continued edema. Other than that not sure anything needs to be done. His baseline sodium lately at best is been in the low 130s so I would not expect for his sodium to completely normalize. If we fail to see any improvement in the next couple of days we could add demeclocycline.  #2 Hypokalemia: I agree with repletion and he will likely need more as I'm increasing his Lasix as well.  #3 Chronic kidney disease: Is at baseline. Could give a propensity towards volume  overload. He will likely need chronic Lasix therapy.  #4 Anemia: Likely secondary to hospitalization/frequent blood draws/chronic kidney disease. No intervention is needed at this time.  #5- decreased MS- not sure if only due to hyponatremia or other etiology.  Family member at bedside today and says baseline MS is better.  I agree with pursuit of other etiologies per primary team.     Jadian Karman A   10/06/2011,10:34 AM  LOS: 4 days

## 2011-10-06 NOTE — Progress Notes (Signed)
PT Cancellation Note  Treatment cancelled today due to medical issues with patient which prohibited therapy. low K+, tachycardia, abnormal EKG, O2=mid 80s on 4L.   Eric [name] 10/06/2011, 4:03 PM Mali Eppard L. Daxten Kovalenko DPT [phone]

## 2011-10-06 NOTE — Progress Notes (Signed)
OT Cancellation Note  Treatment cancelled today due to patient receiving procedure or test.  Khalid Lacko A OTR/L [id number] 10/06/2011, 3:41 PM

## 2011-10-06 NOTE — Progress Notes (Signed)
CSW provide patient's son with choice list of facilities. He is going to discuss the patient's options with the family. CSW also informed patient and patient's son of the $50.00 daily co-pay. CSW will continue to follow and asssist with all d.c needs.  Rhea Pink, MSW, [place]

## 2011-10-06 NOTE — Progress Notes (Addendum)
Subjective: Feels sleepy/tired  Objective: Vital signs in last 24 hours: Filed Vitals:   10/05/11 1423 10/05/11 2107 10/06/11 0426 10/06/11 0655  BP: 137/57 180/73 171/77 104/64  Pulse: 106 119 117 106  Temp: 99.5 F (37.5 C) 99.1 F (37.3 C) 100.3 F (37.9 C)   TempSrc: Oral Oral Oral   Resp: 18 18 16    Height:      Weight:   69.8 kg (153 lb 14.1 oz)   SpO2: 94% 92% 91%    Weight change: -0.825 kg (-1 lb 13.1 oz)  Intake/Output Summary (Last 24 hours) at 10/06/11 0832 Last data filed at 10/06/11 0230  Gross per 24 hour  Intake    843 ml  Output   1050 ml  Net   -207 ml    Physical Exam: General:drowsy,  arousible, Oriented, No acute distress. HEENT: EOMI. Neck: Supple CV: S1 and S2 Lungs: decreased breath sounds at bases Abdomen: Soft, Nontender, Nondistended, +bowel sounds. Ext: Good pulses. 2+ LE edema at ankles, trace edema at mid shin.  Lab Results: Basic Metabolic Panel:  Lab 0000000 0620 10/05/11 0638 10/04/11 0635 10/03/11 1420 10/03/11 0710  NA 125* 126* 124* 120* 124*  K 3.2* 3.0* 3.6 3.3* 3.6  CL 88* 90* 89* 87* 89*  CO2 23 23 22 20  18*  GLUCOSE 142* 186* 147* 231* 328*  BUN 27* 26* 27* 28* 29*  CREATININE 1.47* 1.37* 1.36* 1.26 1.23  CALCIUM 8.2* 8.3* 8.5 8.2* 8.5  MG 1.7 -- 1.9 -- --  PHOS -- -- -- -- --   Liver Function Tests:  Lab 10/02/11 1938  AST 34  ALT 30  ALKPHOS 110  BILITOT 1.2  PROT 7.7  ALBUMIN 2.3*   No results found for this basename: LIPASE:5,AMYLASE:5 in the last 168 hours No results found for this basename: AMMONIA:5 in the last 168 hours CBC:  Lab 10/06/11 0620 10/05/11 0638 10/04/11 0635 10/03/11 0710 10/02/11 1938  WBC 6.3 7.7 8.0 9.5 9.2  NEUTROABS -- -- -- -- 8.7*  HGB 9.3* 10.4* 10.8* 12.0* 11.4*  HCT 25.7* 28.6* 30.1* 33.1* 31.6*  MCV 87.4 86.7 87.2 88.0 89.8  PLT 280 264 242 239 206   Cardiac Enzymes: No results found for this basename: CKTOTAL:5,CKMB:5,CKMBINDEX:5,TROPONINI:5 in the last 168 hours BNP  (last 3 results)  Basename 10/02/11 1938 10/01/11 1036 03/30/11 1055  PROBNP 396.1 1962.0* 27.0   CBG:  Lab 10/06/11 0610 10/05/11 2111 10/05/11 1557 10/05/11 1129 10/05/11 0617  GLUCAP 147* 143* 143* 82 160*   No results found for this basename: HGBA1C:5 in the last 72 hours Other Labs: No components found with this basename: POCBNP:3 No results found for this basename: DDIMER:2 in the last 168 hours No results found for this basename: CHOL:2,HDL:2,LDLCALC:2,TRIG:2,CHOLHDL:2,LDLDIRECT:2 in the last 168 hours  Lab 10/03/11 0710  TSH 0.892  T4TOTAL --  T3FREE --  FREET4 --  THYROIDAB --   No results found for this basename: VITAMINB12:2,FOLATE:2,FERRITIN:2,TIBC:2,IRON:2,RETICCTPCT:2 in the last 168 hours  Micro Results: No results found for this or any previous visit (from the past 240 hour(s)).  Studies/Results: No results found.  Medications: I have reviewed the patient's current medications. Scheduled Meds:    . aspirin EC  81 mg Oral Daily  . atorvastatin  20 mg Oral Daily  . calcitRIOL  0.25 mcg Oral Daily  . cyanocobalamin  1,000 mcg Intramuscular Q30 days  . furosemide  80 mg Intravenous BID  . heparin  5,000 Units Subcutaneous Q8H  . hydrALAZINE  25 mg Oral Q8H  . insulin aspart  0-9 Units Subcutaneous TID WC  . insulin aspart protamine-insulin aspart  25 Units Subcutaneous BID WC  . isosorbide mononitrate  10 mg Oral BID WC  . ofloxacin  1 drop Both Eyes Q4H while awake  . pantoprazole  40 mg Oral Q1200  . potassium chloride  40 mEq Oral Daily  . potassium chloride  60 mEq Oral Q4H  . prednisoLONE acetate  1 drop Both Eyes Daily  . senna-docusate  1 tablet Oral BID  . sodium chloride  3 mL Intravenous Q12H  . DISCONTD: furosemide  40 mg Intravenous BID   Continuous Infusions:  PRN Meds:.acetaminophen, lactulose, polyethylene glycol, traMADol  Assessment/Plan: Bilateral lower extremity edema likely due to decompensated acute on chronic diastolic heart  failure and CKD 2-D echocardiogram on 04/21/2011 which showed systolic function was normal, ejection fraction 55-60%, wall motion was normal, there were normal no wall motion abnormalities. Continue diuresis with lasix, -1.7 L so far.  DM (diabetes mellitus), type 1, uncontrolled w/ophthalmic complication NovoLog 0000000 25 units subcutaneous twice daily.  Continue SSI. Stable.  Metabolic encephalopathy: could be secondary to Hyponatremia, also check ammonia level, ABG to r/o Co2 narcosis  Pneumonia/ Left lower lobe consolidation on CXR and CT abd pelvis: poor historian, will start IV levaquin today  Hyponatremia Multifactorial, hypervolemia, SIADH, meds, appreciate renal input, continue diuretics, TSH ok, random cortisol ok/high  Anemia Hemoglobin stable.  Maybe due to CKD, B12 deficiency continue B12 injections.  Chronic kidney disease stage III Creatinine at baseline.  Generalized weakness Continue PT/OT recommending SNF.  Hyperlipidemia Continue statin.  Prophylaxis Subcutaneous heparin.  Code status Full code  Disposition Pending SNF placement, and improvement in mentation/sodium.  Called and discussed with Niece: [name]    LOS: 4 days  [name], MD 10/06/2011, 8:32 AM Pager: [phone]

## 2011-10-07 DIAGNOSIS — E871 Hypo-osmolality and hyponatremia: Secondary | ICD-10-CM

## 2011-10-07 DIAGNOSIS — E1065 Type 1 diabetes mellitus with hyperglycemia: Secondary | ICD-10-CM

## 2011-10-07 DIAGNOSIS — R609 Edema, unspecified: Secondary | ICD-10-CM

## 2011-10-07 DIAGNOSIS — D696 Thrombocytopenia, unspecified: Secondary | ICD-10-CM

## 2011-10-07 LAB — GLUCOSE, CAPILLARY: Glucose-Capillary: 179 mg/dL — ABNORMAL HIGH (ref 70–99)

## 2011-10-07 LAB — BASIC METABOLIC PANEL
BUN: 32 mg/dL — ABNORMAL HIGH (ref 6–23)
Chloride: 87 mEq/L — ABNORMAL LOW (ref 96–112)
GFR calc Af Amer: 42 mL/min — ABNORMAL LOW (ref >90)
GFR calc non Af Amer: 36 mL/min — ABNORMAL LOW (ref >90)
Potassium: 3 mEq/L — ABNORMAL LOW (ref 3.5–5.1)

## 2011-10-07 LAB — CBC
HCT: 26.1 % — ABNORMAL LOW (ref 39.0–52.0)
MCHC: 36.4 g/dL — ABNORMAL HIGH (ref 30.0–36.0)
Platelets: 269 10*3/uL (ref 150–400)
RDW: 13.1 % (ref 11.5–15.5)
WBC: 10.6 10*3/uL — ABNORMAL HIGH (ref 4.0–10.5)

## 2011-10-07 MED ORDER — DEMECLOCYCLINE HCL 150 MG PO TABS
150.0000 mg | ORAL_TABLET | Freq: Two times a day (BID) | ORAL | Status: DC
  Administered 2011-10-07 – 2011-10-18 (×22): 150 mg via ORAL
  Filled 2011-10-07 (×25): qty 1

## 2011-10-07 MED ORDER — METOLAZONE 2.5 MG PO TABS
2.5000 mg | ORAL_TABLET | Freq: Two times a day (BID) | ORAL | Status: DC
  Administered 2011-10-07 – 2011-10-08 (×3): 2.5 mg via ORAL
  Filled 2011-10-07 (×4): qty 1

## 2011-10-07 MED ORDER — LEVALBUTEROL HCL 0.63 MG/3ML IN NEBU
0.6300 mg | INHALATION_SOLUTION | RESPIRATORY_TRACT | Status: DC | PRN
  Filled 2011-10-07: qty 3

## 2011-10-07 MED ORDER — LEVALBUTEROL HCL 0.63 MG/3ML IN NEBU
0.6300 mg | INHALATION_SOLUTION | Freq: Four times a day (QID) | RESPIRATORY_TRACT | Status: DC
  Administered 2011-10-07 (×2): 0.63 mg via RESPIRATORY_TRACT
  Filled 2011-10-07 (×5): qty 3

## 2011-10-07 MED ORDER — POTASSIUM CHLORIDE CRYS ER 20 MEQ PO TBCR
40.0000 meq | EXTENDED_RELEASE_TABLET | Freq: Two times a day (BID) | ORAL | Status: DC
  Administered 2011-10-07 – 2011-10-09 (×5): 40 meq via ORAL
  Filled 2011-10-07 (×6): qty 2
  Filled 2011-10-07: qty 3
  Filled 2011-10-07: qty 2

## 2011-10-07 NOTE — Progress Notes (Signed)
Occupational Therapy Treatment Patient Details Name: Eric [name] MRN: [id number] DOB: 1924/01/03 Today's Date: 10/07/2011 Time: [id number] OT Time Calculation (min): 13 min  OT Assessment / Plan / Recommendation Comments on Treatment Session Pt. with decreased activity tolerance and SOB with ADL tasks EOB and with transfer to chair.     Follow Up Recommendations  Skilled nursing facility       Equipment Recommendations  Defer to next venue       Frequency Min 2X/week   Plan Discharge plan remains appropriate    Precautions / Restrictions Precautions Precautions: Fall Restrictions Weight Bearing Restrictions: No       ADL  Upper Body Dressing: Performed;Minimal assistance (don gown) Where Assessed - Upper Body Dressing: Unsupported sitting Toilet Transfer: Simulated;Minimal assistance Toilet Transfer Method: Stand pivot Toilet Transfer Equipment: Other (comment) (recliner) Transfers/Ambulation Related to ADLs: Min assist and min verbal cues for safety with RW in room around bed ADL Comments: Pt. with decreased activity tolerance with ADL tasks and provided with seated rest break during functional mobility in room for ~53mins.      OT Goals Acute Rehab OT Goals OT Goal Formulation: With patient ADL Goals Pt Will Perform Upper Body Dressing: with set-up;with supervision;Standing ADL Goal: Upper Body Dressing - Progress: Progressing toward goals Pt Will Transfer to Toilet: with supervision;with DME;3-in-1;Ambulation ADL Goal: Toilet Transfer - Progress: Progressing toward goals Additional ADL Goal #1: Pt. will recall 3 energy conservation techniques with ADLs ADL Goal: Additional Goal #1 - Progress: Progressing toward goals  Visit Information  Last OT Received On: 10/07/11 Assistance Needed: +1          Cognition  Overall Cognitive Status: Impaired Area of Impairment: Attention Arousal/Alertness: Lethargic Current Attention Level: Sustained Attention - Other  Comments: Needs occasional cues for redirection to task secondary to quickly falls asleep    Mobility Bed Mobility Bed Mobility: Supine to Sit Rolling Left: 5: Supervision Transfers Sit to Stand: 4: Min guard;With armrests;From chair/3-in-1 Stand to Sit: 4: Min guard;With upper extremity assist;To bed Details for Transfer Assistance: Min verbal cues for hand placement and technique         End of Session OT - End of Session Equipment Utilized During Treatment: Gait belt Activity Tolerance: Patient tolerated treatment well Patient left: in chair;with call bell/phone within reach;with nursing in room Nurse Communication: Mobility status   Geraldine Sandberg, OTR/L Pager [phone] 10/07/2011, 1:29 PM

## 2011-10-07 NOTE — Progress Notes (Signed)
Subjective:  Still somewhat mentally slow, just not perking up.  Sodium is worse, now diagnosed with PNA as well.  Kidney function is seemingly worsening as well.  Objective Vital signs in last 24 hours: Filed Vitals:   10/06/11 2138 10/07/11 0315 10/07/11 0518 10/07/11 0940  BP: 165/83 179/79 172/76   Pulse: 128 119 117   Temp: 100.9 F (38.3 C) 99.8 F (37.7 C)    TempSrc: Oral Oral    Resp: 22 24 18    Height:      Weight:   70.1 kg (154 lb 8.7 oz)   SpO2: 91% 94% 95% 92%   Weight change: 0.3 kg (10.6 oz)  Intake/Output Summary (Last 24 hours) at 10/07/11 1151 Last data filed at 10/07/11 0900  Gross per 24 hour  Intake    976 ml  Output   2250 ml  Net  -1274 ml   Labs: Basic Metabolic Panel:  Lab [id number] 0600 10/06/11 0620 10/05/11 0638  NA 124* 125* 126*  K 3.0* 3.2* 3.0*  CL 87* 88* 90*  CO2 24 23 23   GLUCOSE 165* 142* 186*  BUN 32* 27* 26*  CREATININE 1.66* 1.47* 1.37*  CALCIUM 8.3* 8.2* 8.3*  ALB -- -- --  PHOS -- -- --   Liver Function Tests:  Lab 10/02/11 1938  AST 34  ALT 30  ALKPHOS 110  BILITOT 1.2  PROT 7.7  ALBUMIN 2.3*   No results found for this basename: LIPASE:3,AMYLASE:3 in the last 168 hours  Lab 10/06/11 0927  AMMONIA 22   CBC:  Lab 10/07/11 0600 10/06/11 0620 10/05/11 0638 10/04/11 0635 10/03/11 0710 10/02/11 1938  WBC 10.6* 6.3 7.7 -- -- --  NEUTROABS -- -- -- -- -- 8.7*  HGB 9.5* 9.3* 10.4* -- -- --  HCT 26.1* 25.7* 28.6* -- -- --  MCV 85.6 87.4 86.7 87.2 88.0 --  PLT 269 280 264 -- -- --   Cardiac Enzymes: No results found for this basename: CKTOTAL:5,CKMB:5,CKMBINDEX:5,TROPONINI:5 in the last 168 hours CBG:  Lab 10/07/11 1119 10/07/11 0720 10/06/11 2135 10/06/11 1639 10/06/11 1126  GLUCAP 179* 164* 162* 139* 94    Iron Studies: No results found for this basename: IRON,TIBC,TRANSFERRIN,FERRITIN in the last 72 hours Studies/Results: Dg Chest Port 1 View  10/06/2011  *RADIOLOGY REPORT*  Clinical Data: Fever, shortness  of breath and cough.  PORTABLE CHEST - 1 VIEW  Comparison: Plain films of the chest 06/01/2011 and 10/03/2011.  Findings: Left effusion and airspace disease and markedly worsened. There is new mild appearing right basilar airspace disease.  Heart size is upper normal.  IMPRESSION: Worsened left effusion and airspace disease compatible with progressive pneumonia.  Mild right basilar opacity is also now identified.  Original Report Authenticated By: Arvid Right. Luther Parody, M.D.   Medications: Infusions:    Scheduled Medications:    . aspirin EC  81 mg Oral Daily  . atorvastatin  20 mg Oral Daily  . calcitRIOL  0.25 mcg Oral Daily  . cyanocobalamin  1,000 mcg Intramuscular Q30 days  . furosemide  80 mg Intravenous Q6H  . heparin  5,000 Units Subcutaneous Q8H  . hydrALAZINE  25 mg Oral Q8H  . insulin aspart  0-9 Units Subcutaneous TID WC  . insulin aspart protamine-insulin aspart  25 Units Subcutaneous BID WC  . isosorbide mononitrate  10 mg Oral BID WC  . levalbuterol  0.63 mg Nebulization Q6H  . levofloxacin (LEVAQUIN) IV  250 mg Intravenous Q24H  . pantoprazole  40 mg  Oral Q1200  . potassium chloride  40 mEq Oral Daily  . potassium chloride  40 mEq Oral Once  . prednisoLONE acetate  1 drop Both Eyes Daily  . senna-docusate  1 tablet Oral BID  . sodium chloride  3 mL Intravenous Q12H    have reviewed scheduled and prn medications.  Physical Exam: General: alert, but slow to answer questions Heart: RRR Lungs: poor effort, hypoxic Abdomen: soft,, non tender Extremities: still with pitting edema   I Assessment/ Plan: Pt is a 78 y.o. yo male who was admitted on 10/02/2011 with edema and hyponatremia  Assessment/Plan: #1 Hyponatremia:  Seemingly worsening.  has an element of hypervolemic hyponatremia which is being appropriately treated with Lasix, given edema and hypoxia will add zaroxolyn. Given his elevated urinary osmolality he also has features of SIADH. He is on 1500 cc fluid  restriction, will add demeclocycline as well for continued and worsening sodium.  His baseline sodium lately at best is been in the low 130s so I would not expect for his sodium to completely normalize.  #2 Hypokalemia: increase repletion to 80 q day #3 Chronic kidney disease: Also seemingly worsening vs just unmasking of CKD with diuresis  #4 Anemia: Likely secondary to hospitalization/frequent blood draws/chronic kidney disease. No intervention is needed at this time.  #5- decreased MS- not sure if only due to hyponatremia or other etiology/PNA. I am concerned about his failure to thrive with appropriate therapies.  Consider discussion of goals of care.   #6. Has bed at [facility].  Not stable for SNF     Lavoy Bernards A   10/07/2011,11:51 AM  LOS: 5 days

## 2011-10-07 NOTE — Progress Notes (Signed)
Physical Therapy Treatment Patient Details Name: Eric [name] MRN: [id number] DOB: [birth year] Today's Date: 10/07/2011 Time: [id number] PT Time Calculation (min): 24 min  PT Assessment / Plan / Recommendation Comments on Treatment Session  Patient with improved functional activity tolerance today, but remains limited by his respiratory status.    Follow Up Recommendations  Skilled nursing facility    Barriers to Discharge  None      Equipment Recommendations  Defer to next venue    Recommendations for Other Services  None  Frequency Min 3X/week   Plan Discharge plan remains appropriate;Frequency remains appropriate    Precautions / Restrictions Precautions Precautions: Fall   Pertinent Vitals/Pain See below. No pain    Mobility  Transfers Sit to Stand: 4: Min guard;With armrests;From chair/3-in-1 Stand to Sit: 4: Min guard;With upper extremity assist;To bed Details for Transfer Assistance: Verbal cues for correct hand placement to and from surface. Ambulation/Gait Ambulation/Gait Assistance: 4: Min guard Ambulation Distance (Feet): 5 Feet Assistive device: Rolling walker Ambulation/Gait Assistance Details: SpO2 with transfers decreased to 88 briefly and increase back to 92% with short seated rest break. Limited by drop in SpO2. Verbal cues for posture and increased stride length.  Gait Pattern: Step-to pattern;Shuffle    Exercises General Exercises - Lower Extremity Long Arc Quad: AROM;Both;10 reps Hip ABduction/ADduction: AROM;Both;10 reps (with hip adduction squeezes) Hip Flexion/Marching: AROM;Both;10 reps;Seated Toe Raises: AROM;Both;Seated;10 reps Heel Raises: AROM;Both;Seated;10 reps    PT Goals Acute Rehab PT Goals PT Goal: Sit to Stand - Progress: Progressing toward goal PT Goal: Stand to Sit - Progress: Progressing toward goal PT Transfer Goal: Bed to Chair/Chair to Bed - Progress: Progressing toward goal PT Goal: Ambulate - Progress: Progressing toward  goal  Visit Information  Last PT Received On: 10/07/11 Assistance Needed: +1    Subjective Data  Subjective: Patient reports remains tired. Patient Stated Goal: Increase his strength.    Cognition  Overall Cognitive Status: Impaired Area of Impairment: Attention Arousal/Alertness: Lethargic Current Attention Level: Sustained Attention - Other Comments: Needs occasional cues for redirection to task secondary to quickly falls asleep    Balance   No evidence of imbalance  End of Session PT - End of Session Equipment Utilized During Treatment: Gait belt;Oxygen Activity Tolerance: Treatment limited secondary to medical complications (Comment) (limited functional activity tolerance due to O2 requirements) Patient left: in bed;with call bell/phone within reach;with nursing in room (nurse tech present to bath patient.) Nurse Communication: Mobility status    Jake Bathe, PT  Pager [phone]  10/07/2011, 10:30 AM

## 2011-10-07 NOTE — Progress Notes (Signed)
Subjective: More alert today, was hypoxic last pm and febrile yesterday  Objective: Vital signs in last 24 hours: Filed Vitals:   10/06/11 1827 10/06/11 2138 10/07/11 0315 10/07/11 0518  BP: 160/73 165/83 179/79 172/76  Pulse: 131 128 119 117  Temp:  100.9 F (38.3 C) 99.8 F (37.7 C)   TempSrc:  Oral Oral   Resp: 24 22 24 18   Height:      Weight:    70.1 kg (154 lb 8.7 oz)  SpO2: 89% 91% 94% 95%   Weight change: 0.3 kg (10.6 oz)  Intake/Output Summary (Last 24 hours) at 10/07/11 0829 Last data filed at 10/07/11 0518  Gross per 24 hour  Intake    436 ml  Output   1900 ml  Net  -1464 ml    Physical Exam: General: alert, awake, oriented to place/person, No acute distress. HEENT: EOMI. Neck: Supple CV: S1 and S2 Lungs: decreased breath sounds at bases, poor air movement bilaterally Abdomen: Soft, Nontender, Nondistended, +bowel sounds. Ext: Good pulses. 2+ LE edema at ankles, trace edema at mid shin.  Lab Results: Basic Metabolic Panel:  Lab [id number] 0600 10/06/11 0620 10/05/11 0638 10/04/11 0635 10/03/11 1420  NA 124* 125* 126* 124* 120*  K 3.0* 3.2* 3.0* 3.6 3.3*  CL 87* 88* 90* 89* 87*  CO2 24 23 23 22 20   GLUCOSE 165* 142* 186* 147* 231*  BUN 32* 27* 26* 27* 28*  CREATININE 1.66* 1.47* 1.37* 1.36* 1.26  CALCIUM 8.3* 8.2* 8.3* 8.5 8.2*  MG -- 1.7 -- 1.9 --  PHOS -- -- -- -- --   Liver Function Tests:  Lab 10/02/11 1938  AST 34  ALT 30  ALKPHOS 110  BILITOT 1.2  PROT 7.7  ALBUMIN 2.3*   No results found for this basename: LIPASE:5,AMYLASE:5 in the last 168 hours  Lab 10/06/11 0927  AMMONIA 22   CBC:  Lab 10/07/11 0600 10/06/11 0620 10/05/11 0638 10/04/11 0635 10/03/11 0710 10/02/11 1938  WBC 10.6* 6.3 7.7 8.0 9.5 --  NEUTROABS -- -- -- -- -- 8.7*  HGB 9.5* 9.3* 10.4* 10.8* 12.0* --  HCT 26.1* 25.7* 28.6* 30.1* 33.1* --  MCV 85.6 87.4 86.7 87.2 88.0 --  PLT 269 280 264 242 239 --   Cardiac Enzymes: No results found for this basename:  CKTOTAL:5,CKMB:5,CKMBINDEX:5,TROPONINI:5 in the last 168 hours BNP (last 3 results)  Basename 10/02/11 1938 10/01/11 1036 03/30/11 1055  PROBNP 396.1 1962.0* 27.0   CBG:  Lab 10/06/11 2135 10/06/11 1639 10/06/11 1126 10/06/11 0610 10/05/11 2111  GLUCAP 162* 139* 94 147* 143*   No results found for this basename: HGBA1C:5 in the last 72 hours Other Labs: No components found with this basename: POCBNP:3 No results found for this basename: DDIMER:2 in the last 168 hours No results found for this basename: CHOL:2,HDL:2,LDLCALC:2,TRIG:2,CHOLHDL:2,LDLDIRECT:2 in the last 168 hours  Lab 10/03/11 0710  TSH 0.892  T4TOTAL --  T3FREE --  FREET4 --  THYROIDAB --   No results found for this basename: VITAMINB12:2,FOLATE:2,FERRITIN:2,TIBC:2,IRON:2,RETICCTPCT:2 in the last 168 hours  Micro Results: No results found for this or any previous visit (from the past 240 hour(s)).  Studies/Results: Dg Chest Port 1 View  10/06/2011  *RADIOLOGY REPORT*  Clinical Data: Fever, shortness of breath and cough.  PORTABLE CHEST - 1 VIEW  Comparison: Plain films of the chest 06/01/2011 and 10/03/2011.  Findings: Left effusion and airspace disease and markedly worsened. There is new mild appearing right basilar airspace disease.  Heart size  is upper normal.  IMPRESSION: Worsened left effusion and airspace disease compatible with progressive pneumonia.  Mild right basilar opacity is also now identified.  Original Report Authenticated By: Arvid Right. Luther Parody, M.D.    Medications: I have reviewed the patient's current medications. Scheduled Meds:    . aspirin EC  81 mg Oral Daily  . atorvastatin  20 mg Oral Daily  . calcitRIOL  0.25 mcg Oral Daily  . cyanocobalamin  1,000 mcg Intramuscular Q30 days  . furosemide  80 mg Intravenous Q6H  . heparin  5,000 Units Subcutaneous Q8H  . hydrALAZINE  25 mg Oral Q8H  . insulin aspart  0-9 Units Subcutaneous TID WC  . insulin aspart protamine-insulin aspart  25 Units  Subcutaneous BID WC  . isosorbide mononitrate  10 mg Oral BID WC  . levofloxacin (LEVAQUIN) IV  250 mg Intravenous Q24H  . pantoprazole  40 mg Oral Q1200  . potassium chloride  40 mEq Oral Daily  . potassium chloride  40 mEq Oral Once  . prednisoLONE acetate  1 drop Both Eyes Daily  . senna-docusate  1 tablet Oral BID  . sodium chloride  3 mL Intravenous Q12H  . DISCONTD: furosemide  80 mg Intravenous BID  . DISCONTD: levofloxacin (LEVAQUIN) IV  500 mg Intravenous Q24H   Continuous Infusions:  PRN Meds:.acetaminophen, lactulose, polyethylene glycol, traMADol  Assessment/Plan: Bilateral lower extremity edema likely due to decompensated acute on chronic diastolic heart failure and CKD 2-D echocardiogram on 04/21/2011 which showed systolic function was normal, ejection fraction 55-60%, wall motion was normal, there were normal no wall motion abnormalities.  Diuresis per Renal, ? Decrease dose.  Metabolic encephalopathy: could be secondary to Pneumonia, and hyponatremia  Pneumonia/ Left lower lobe consolidation with effusion on CXR and CT abd pelvis: poor historian, I started IV levaquin yesterday, add xopenex nebs  Hyponatremia Multifactorial, hypervolemia, SIADH-pneumonia, meds, appreciate renal input, TSH ok, random cortisol ok/high  DM (diabetes mellitus), type 1, uncontrolled w/ophthalmic complication NovoLog 0000000 25 units subcutaneous twice daily.  Continue SSI. Stable.  Anemia Hemoglobin stable.  Maybe due to CKD, B12 deficiency continue B12 injections.  Chronic kidney disease stage III stable  Generalized weakness Continue PT/OT recommending SNF.  Hyperlipidemia Continue statin.  Prophylaxis Subcutaneous heparin.  Code status Full code  Disposition Pending SNF placement, and improvement in mentation/sodium.  Called and discussed with Niece: [name] yesterday    LOS: 5 days  [name], MD 10/07/2011, 8:29 AM Pager: [phone]

## 2011-10-07 NOTE — Progress Notes (Signed)
Family would like to accept the bed offer at [facility]. CSW will notify facility and assist with d/c when patient is medically stable.  Rhea Pink, MSW, [place]

## 2011-10-08 DIAGNOSIS — R609 Edema, unspecified: Secondary | ICD-10-CM

## 2011-10-08 DIAGNOSIS — D696 Thrombocytopenia, unspecified: Secondary | ICD-10-CM

## 2011-10-08 DIAGNOSIS — E871 Hypo-osmolality and hyponatremia: Secondary | ICD-10-CM

## 2011-10-08 DIAGNOSIS — E1065 Type 1 diabetes mellitus with hyperglycemia: Secondary | ICD-10-CM

## 2011-10-08 LAB — CBC
HCT: 23.9 % — ABNORMAL LOW (ref 39.0–52.0)
MCH: 31.5 pg (ref 26.0–34.0)
MCV: 85.7 fL (ref 78.0–100.0)
RBC: 2.79 MIL/uL — ABNORMAL LOW (ref 4.22–5.81)
RDW: 13.1 % (ref 11.5–15.5)
WBC: 11.3 10*3/uL — ABNORMAL HIGH (ref 4.0–10.5)

## 2011-10-08 LAB — GLUCOSE, CAPILLARY
Glucose-Capillary: 186 mg/dL — ABNORMAL HIGH (ref 70–99)
Glucose-Capillary: 132 mg/dL — ABNORMAL HIGH (ref 70–99)
Glucose-Capillary: 206 mg/dL — ABNORMAL HIGH (ref 70–99)

## 2011-10-08 LAB — BASIC METABOLIC PANEL
BUN: 41 mg/dL — ABNORMAL HIGH (ref 6–23)
CO2: 25 mEq/L (ref 19–32)
Calcium: 8.2 mg/dL — ABNORMAL LOW (ref 8.4–10.5)
Chloride: 86 mEq/L — ABNORMAL LOW (ref 96–112)
Creatinine, Ser: 1.83 mg/dL — ABNORMAL HIGH (ref 0.50–1.35)

## 2011-10-08 MED ORDER — POTASSIUM CHLORIDE CRYS ER 20 MEQ PO TBCR
60.0000 meq | EXTENDED_RELEASE_TABLET | Freq: Once | ORAL | Status: AC
  Administered 2011-10-08: 60 meq via ORAL

## 2011-10-08 MED ORDER — FUROSEMIDE 10 MG/ML IJ SOLN
80.0000 mg | Freq: Two times a day (BID) | INTRAMUSCULAR | Status: DC
  Administered 2011-10-08 – 2011-10-09 (×2): 80 mg via INTRAVENOUS
  Filled 2011-10-08 (×4): qty 8

## 2011-10-08 MED ORDER — LEVOFLOXACIN IN D5W 750 MG/150ML IV SOLN
750.0000 mg | INTRAVENOUS | Status: DC
  Administered 2011-10-08 – 2011-10-14 (×4): 750 mg via INTRAVENOUS
  Filled 2011-10-08 (×4): qty 150

## 2011-10-08 MED ORDER — LEVALBUTEROL HCL 0.63 MG/3ML IN NEBU
0.6300 mg | INHALATION_SOLUTION | Freq: Three times a day (TID) | RESPIRATORY_TRACT | Status: DC
  Administered 2011-10-08 – 2011-10-12 (×10): 0.63 mg via RESPIRATORY_TRACT
  Filled 2011-10-08 (×16): qty 3

## 2011-10-08 MED ORDER — POTASSIUM CHLORIDE 10 MEQ/100ML IV SOLN
10.0000 meq | INTRAVENOUS | Status: AC
  Administered 2011-10-08 (×4): 10 meq via INTRAVENOUS
  Filled 2011-10-08 (×4): qty 100

## 2011-10-08 MED ORDER — HYDRALAZINE HCL 50 MG PO TABS
50.0000 mg | ORAL_TABLET | Freq: Three times a day (TID) | ORAL | Status: DC
  Administered 2011-10-08 – 2011-10-12 (×12): 50 mg via ORAL
  Filled 2011-10-08 (×15): qty 1

## 2011-10-08 NOTE — Progress Notes (Signed)
Physical Therapy Treatment Patient Details Name: Eric [name] MRN: [id number] DOB: March 30, 1924 Today's Date: 10/08/2011 Time: [id number] PT Time Calculation (min): 31 min  PT Assessment / Plan / Recommendation Comments on Treatment Session  Pt mobility continues to improve.  Generalized weakness continues to hinder mobility.      Follow Up Recommendations  Skilled nursing facility    Barriers to Discharge        Equipment Recommendations  Defer to next venue    Recommendations for Other Services    Frequency Min 3X/week   Plan Discharge plan remains appropriate;Frequency remains appropriate    Precautions / Restrictions Precautions Precautions: Fall Restrictions Weight Bearing Restrictions: No   Pertinent Vitals/Pain No c/o pain.     Mobility  Bed Mobility Sit to Supine: 5: Supervision Details for Bed Mobility Assistance: Cues for sequencing  Transfers Transfers: Sit to Stand;Stand to Sit Sit to Stand: 4: Min assist;From chair/3-in-1 (3 trials.  ) Stand to Sit: 4: Min guard;With upper extremity assist;To bed Details for Transfer Assistance: Assist to steady pt and assist in lifting body weight secondary to weakness Ambulation/Gait Ambulation/Gait Assistance: 4: Min guard Ambulation Distance (Feet): 40 Feet Assistive device: Rolling walker Ambulation/Gait Assistance Details: Cues for upright postures .   Gait Pattern: Step-to pattern;Shuffle Stairs: No Wheelchair Mobility Wheelchair Mobility: No    Exercises     PT Diagnosis:    PT Problem List:   PT Treatment Interventions:     PT Goals Acute Rehab PT Goals PT Goal Formulation: With patient Time For Goal Achievement: 10/17/11 Potential to Achieve Goals: Good Pt will go Supine/Side to Sit: with modified independence PT Goal: Supine/Side to Sit - Progress: Progressing toward goal Pt will Sit at Edge of Bed: Independently;6-10 min;with no upper extremity support PT Goal: Sit at Edge Of Bed - Progress:  Progressing toward goal Pt will go Sit to Supine/Side: Independently;with HOB 0 degrees PT Goal: Sit to Supine/Side - Progress: Progressing toward goal Pt will go Sit to Stand: Independently;with upper extremity assist PT Goal: Sit to Stand - Progress: Progressing toward goal Pt will go Stand to Sit: Independently PT Goal: Stand to Sit - Progress: Progressing toward goal Pt will Transfer Bed to Chair/Chair to Bed: Independently PT Transfer Goal: Bed to Chair/Chair to Bed - Progress: Progressing toward goal Pt will Ambulate: >150 feet;with modified independence;with cane PT Goal: Ambulate - Progress: Progressing toward goal Pt will Go Up / Down Stairs: 6-9 stairs;with modified independence;with rail(s);with cane PT Goal: Up/Down Stairs - Progress: Progressing toward goal  Visit Information  Last PT Received On: 10/08/11 Assistance Needed: +1    Subjective Data      Cognition  Overall Cognitive Status: Impaired Area of Impairment: Attention Arousal/Alertness: Lethargic Orientation Level: Disoriented to;Place;Time;Situation Current Attention Level: Sustained    Balance  Balance Balance Assessed: No  End of Session PT - End of Session Equipment Utilized During Treatment: Gait belt;Oxygen Activity Tolerance: Treatment limited secondary to medical complications (Comment) Patient left: in bed;with call bell/phone within reach;with nursing in room Nurse Communication: Mobility status    Velvie Thomaston 10/08/2011, 6:06 PM Carlen Fils L. [place] DPT [phone]

## 2011-10-08 NOTE — Progress Notes (Signed)
Subjective:  Does look brighter today.  Good UOP on meds, BUN and creatinine continue to worsen unfortunately.  Sodium staying low/stable.  Objective Vital signs in last 24 hours: Filed Vitals:   10/07/11 1317 10/07/11 2053 10/07/11 2148 10/08/11 0441  BP: 147/78  176/66 172/64  Pulse: 122  127 114  Temp: 98.8 F (37.1 C)  100.5 F (38.1 C) 100 F (37.8 C)  TempSrc: Oral  Oral Oral  Resp: 24  18 20   Height:      Weight:    69.673 kg (153 lb 9.6 oz)  SpO2: 92% 91% 100% 98%   Weight change: -0.428 kg (-15.1 oz)  Intake/Output Summary (Last 24 hours) at 10/08/11 1226 Last data filed at 10/08/11 0900  Gross per 24 hour  Intake   1380 ml  Output   1925 ml  Net   -545 ml   Labs: Basic Metabolic Panel:  Lab [id number] 0525 10/07/11 0600 10/06/11 0620  NA 125* 124* 125*  K 2.3* 3.0* 3.2*  CL 86* 87* 88*  CO2 25 24 23   GLUCOSE 175* 165* 142*  BUN 41* 32* 27*  CREATININE 1.83* 1.66* 1.47*  CALCIUM 8.2* 8.3* 8.2*  ALB -- -- --  PHOS -- -- --   Liver Function Tests:  Lab 10/02/11 1938  AST 34  ALT 30  ALKPHOS 110  BILITOT 1.2  PROT 7.7  ALBUMIN 2.3*   No results found for this basename: LIPASE:3,AMYLASE:3 in the last 168 hours  Lab 10/06/11 0927  AMMONIA 22   CBC:  Lab 10/08/11 0525 10/07/11 0600 10/06/11 0620 10/05/11 0638 10/04/11 0635 10/02/11 1938  WBC 11.3* 10.6* 6.3 -- -- --  NEUTROABS -- -- -- -- -- 8.7*  HGB 8.8* 9.5* 9.3* -- -- --  HCT 23.9* 26.1* 25.7* -- -- --  MCV 85.7 85.6 87.4 86.7 87.2 --  PLT 288 269 280 -- -- --   Cardiac Enzymes: No results found for this basename: CKTOTAL:5,CKMB:5,CKMBINDEX:5,TROPONINI:5 in the last 168 hours CBG:  Lab 10/08/11 0740 10/07/11 2144 10/07/11 1617 10/07/11 1119 10/07/11 0720  GLUCAP 186* 196* 259* 179* 164*    Iron Studies: No results found for this basename: IRON,TIBC,TRANSFERRIN,FERRITIN in the last 72 hours Studies/Results: Dg Chest Port 1 View  10/06/2011  *RADIOLOGY REPORT*  Clinical Data: Fever,  shortness of breath and cough.  PORTABLE CHEST - 1 VIEW  Comparison: Plain films of the chest 06/01/2011 and 10/03/2011.  Findings: Left effusion and airspace disease and markedly worsened. There is new mild appearing right basilar airspace disease.  Heart size is upper normal.  IMPRESSION: Worsened left effusion and airspace disease compatible with progressive pneumonia.  Mild right basilar opacity is also now identified.  Original Report Authenticated By: Arvid Right. Luther Parody, M.D.   Medications: Infusions:    Scheduled Medications:    . aspirin EC  81 mg Oral Daily  . atorvastatin  20 mg Oral Daily  . calcitRIOL  0.25 mcg Oral Daily  . cyanocobalamin  1,000 mcg Intramuscular Q30 days  . demeclocycline  150 mg Oral Q12H  . furosemide  80 mg Intravenous Q6H  . heparin  5,000 Units Subcutaneous Q8H  . hydrALAZINE  25 mg Oral Q8H  . insulin aspart  0-9 Units Subcutaneous TID WC  . insulin aspart protamine-insulin aspart  25 Units Subcutaneous BID WC  . isosorbide mononitrate  10 mg Oral BID WC  . levalbuterol  0.63 mg Nebulization TID  . levofloxacin (LEVAQUIN) IV  750 mg Intravenous Q48H  .  metolazone  2.5 mg Oral BID  . pantoprazole  40 mg Oral Q1200  . potassium chloride  10 mEq Intravenous Q1 Hr x 4  . potassium chloride  40 mEq Oral BID  . potassium chloride  60 mEq Oral Once  . prednisoLONE acetate  1 drop Both Eyes Daily  . senna-docusate  1 tablet Oral BID  . sodium chloride  3 mL Intravenous Q12H  . DISCONTD: levalbuterol  0.63 mg Nebulization Q6H  . DISCONTD: levofloxacin (LEVAQUIN) IV  250 mg Intravenous Q24H    have reviewed scheduled and prn medications.  Physical Exam: General: alert, but slow to answer questions Heart: RRR Lungs: poor effort, hypoxic, but O2 req is down.  Abdomen: soft,, non tender Extremities: still with edema but seems better   I Assessment/ Plan: Pt is a 78 y.o. yo male who was admitted on 10/02/2011 with edema and hyponatremia    Assessment/Plan: #1 Hyponatremia:  Stable.  has an element of hypervolemic hyponatremia which is being appropriately treated with diuresis. Given his elevated urinary osmolality he also has features of SIADH. He is on 1500 cc fluid restriction, have added demeclocycline as well for continued and worsening sodium.  His baseline sodium lately at best is been in the low 130s so I would not expect for his sodium to completely normalize.  #2 Hypokalemia: increase repletion to 80 q day.  Agree with IV repletion today as well.  #3 Chronic kidney disease: Also seemingly worsening vs just unmasking of CKD with diuresis.  Since edema is better will back off a little on meds (d/c zarox and decrease laix).  But dont think he is too dry #4 Anemia: Likely secondary to hospitalization/frequent blood draws/chronic kidney disease. No intervention is needed at this time.  #5- decreased MS- not sure if only due to hyponatremia or other etiology/PNA. I am concerned about his failure to thrive with appropriate therapies.  Consider discussion of goals of care.   #6. Has bed at [facility].  Not stable for SNF  #7. HTN- will increase hydralazine.      Eric [name] A   10/08/2011,12:26 PM  LOS: 6 days

## 2011-10-08 NOTE — Progress Notes (Signed)
Subjective: Still a little lethargic, oxygenation improved, still coughing, ambulated a little with OT yesterday  Objective: Vital signs in last 24 hours: Filed Vitals:   10/07/11 1317 10/07/11 2053 10/07/11 2148 10/08/11 0441  BP: 147/78  176/66 172/64  Pulse: 122  127 114  Temp: 98.8 F (37.1 C)  100.5 F (38.1 C) 100 F (37.8 C)  TempSrc: Oral  Oral Oral  Resp: 24  18 20   Height:      Weight:    69.673 kg (153 lb 9.6 oz)  SpO2: 92% 91% 100% 98%   Weight change: -0.428 kg (-15.1 oz)  Intake/Output Summary (Last 24 hours) at 10/08/11 0800 Last data filed at 10/08/11 0630  Gross per 24 hour  Intake   1500 ml  Output   1600 ml  Net   -100 ml    Physical Exam: General: drowsy, easily aroused,  oriented to place/person, No acute distress. HEENT: EOMI. Neck: Supple CV: S1 and S2 Lungs: decreased breath sounds at bases, crackles L>R, poor air movement bilaterally Abdomen: Soft, Nontender, Nondistended, +bowel sounds. Ext: Good pulses. 2+ LE edema at ankles, trace edema at mid shin.  Lab Results: Basic Metabolic Panel:  Lab [id number] 0525 10/07/11 0600 10/06/11 0620 10/05/11 0638 10/04/11 0635  NA 125* 124* 125* 126* 124*  K 2.3* 3.0* 3.2* 3.0* 3.6  CL 86* 87* 88* 90* 89*  CO2 25 24 23 23 22   GLUCOSE 175* 165* 142* 186* 147*  BUN 41* 32* 27* 26* 27*  CREATININE 1.83* 1.66* 1.47* 1.37* 1.36*  CALCIUM 8.2* 8.3* 8.2* 8.3* 8.5  MG -- -- 1.7 -- 1.9  PHOS -- -- -- -- --   Liver Function Tests:  Lab 10/02/11 1938  AST 34  ALT 30  ALKPHOS 110  BILITOT 1.2  PROT 7.7  ALBUMIN 2.3*   No results found for this basename: LIPASE:5,AMYLASE:5 in the last 168 hours  Lab 10/06/11 0927  AMMONIA 22   CBC:  Lab 10/08/11 0525 10/07/11 0600 10/06/11 0620 10/05/11 0638 10/04/11 0635 10/02/11 1938  WBC 11.3* 10.6* 6.3 7.7 8.0 --  NEUTROABS -- -- -- -- -- 8.7*  HGB 8.8* 9.5* 9.3* 10.4* 10.8* --  HCT 23.9* 26.1* 25.7* 28.6* 30.1* --  MCV 85.7 85.6 87.4 86.7 87.2 --  PLT 288  269 280 264 242 --   Cardiac Enzymes: No results found for this basename: CKTOTAL:5,CKMB:5,CKMBINDEX:5,TROPONINI:5 in the last 168 hours BNP (last 3 results)  Basename 10/02/11 1938 10/01/11 1036 03/30/11 1055  PROBNP 396.1 1962.0* 27.0   CBG:  Lab 10/07/11 2144 10/07/11 1617 10/07/11 1119 10/07/11 0720 10/06/11 2135  GLUCAP 196* 259* 179* 164* 162*   No results found for this basename: HGBA1C:5 in the last 72 hours Other Labs: No components found with this basename: POCBNP:3 No results found for this basename: DDIMER:2 in the last 168 hours No results found for this basename: CHOL:2,HDL:2,LDLCALC:2,TRIG:2,CHOLHDL:2,LDLDIRECT:2 in the last 168 hours  Lab 10/03/11 0710  TSH 0.892  T4TOTAL --  T3FREE --  FREET4 --  THYROIDAB --   No results found for this basename: VITAMINB12:2,FOLATE:2,FERRITIN:2,TIBC:2,IRON:2,RETICCTPCT:2 in the last 168 hours  Micro Results: No results found for this or any previous visit (from the past 240 hour(s)).  Studies/Results: Dg Chest Port 1 View  10/06/2011  *RADIOLOGY REPORT*  Clinical Data: Fever, shortness of breath and cough.  PORTABLE CHEST - 1 VIEW  Comparison: Plain films of the chest 06/01/2011 and 10/03/2011.  Findings: Left effusion and airspace disease and markedly worsened. There  is new mild appearing right basilar airspace disease.  Heart size is upper normal.  IMPRESSION: Worsened left effusion and airspace disease compatible with progressive pneumonia.  Mild right basilar opacity is also now identified.  Original Report Authenticated By: Arvid Right. Luther Parody, M.D.    Medications: I have reviewed the patient's current medications. Scheduled Meds:    . aspirin EC  81 mg Oral Daily  . atorvastatin  20 mg Oral Daily  . calcitRIOL  0.25 mcg Oral Daily  . cyanocobalamin  1,000 mcg Intramuscular Q30 days  . demeclocycline  150 mg Oral Q12H  . furosemide  80 mg Intravenous Q6H  . heparin  5,000 Units Subcutaneous Q8H  . hydrALAZINE  25  mg Oral Q8H  . insulin aspart  0-9 Units Subcutaneous TID WC  . insulin aspart protamine-insulin aspart  25 Units Subcutaneous BID WC  . isosorbide mononitrate  10 mg Oral BID WC  . levalbuterol  0.63 mg Nebulization TID  . levofloxacin (LEVAQUIN) IV  250 mg Intravenous Q24H  . metolazone  2.5 mg Oral BID  . pantoprazole  40 mg Oral Q1200  . potassium chloride  10 mEq Intravenous Q1 Hr x 4  . potassium chloride  40 mEq Oral BID  . potassium chloride  60 mEq Oral Once  . prednisoLONE acetate  1 drop Both Eyes Daily  . senna-docusate  1 tablet Oral BID  . sodium chloride  3 mL Intravenous Q12H  . DISCONTD: levalbuterol  0.63 mg Nebulization Q6H  . DISCONTD: potassium chloride  40 mEq Oral Daily   Continuous Infusions:  PRN Meds:.acetaminophen, lactulose, levalbuterol, polyethylene glycol, traMADol  Assessment/Plan: Bilateral lower extremity edema likely due to decompensated acute on chronic diastolic heart failure and CKD 2-D echocardiogram on 04/21/2011 which showed systolic function was normal, ejection fraction 55-60%, wall motion was normal, there were normal no wall motion abnormalities.  Diuresis per Renal, ? Decrease dose.  Metabolic encephalopathy: suspect from Pneumonia, and hyponatremia  Pneumonia/ Left lower lobe consolidation with effusion on CXR and CT abd pelvis: started IV levaquin 6/19, repeat CXR with increasing L lung consolidation, continue xopenex nebs If fevers persist will broaden coverage  Check blood cultures if temp >100.4  Hyponatremia Multifactorial, hypervolemia, SIADH-pneumonia, meds, appreciate renal input, TSH ok, random cortisol ok/high  DM (diabetes mellitus), type 1, uncontrolled w/ophthalmic complication NovoLog 0000000 25 units subcutaneous twice daily.  Continue SSI. Stable.  Anemia Hemoglobin stable.  Maybe due to CKD, B12 deficiency continue B12 injections.  Chronic kidney disease stage III stable  Generalized weakness Continue PT/OT  recommending SNF.  Hyperlipidemia Continue statin.  Prophylaxis Subcutaneous heparin.  Code status Full code  Disposition Pending SNF placement, and improvement in mentation/sodium.  Called and discussed with Niece: [name] 6/19, will call her today    LOS: 6 days  [name], MD 10/08/2011, 8:00 AM Pager: [phone]

## 2011-10-09 DIAGNOSIS — R609 Edema, unspecified: Secondary | ICD-10-CM

## 2011-10-09 DIAGNOSIS — E1065 Type 1 diabetes mellitus with hyperglycemia: Secondary | ICD-10-CM

## 2011-10-09 DIAGNOSIS — E871 Hypo-osmolality and hyponatremia: Secondary | ICD-10-CM

## 2011-10-09 DIAGNOSIS — D696 Thrombocytopenia, unspecified: Secondary | ICD-10-CM

## 2011-10-09 LAB — BASIC METABOLIC PANEL
Calcium: 8.2 mg/dL — ABNORMAL LOW (ref 8.4–10.5)
Creatinine, Ser: 1.99 mg/dL — ABNORMAL HIGH (ref 0.50–1.35)
GFR calc Af Amer: 34 mL/min — ABNORMAL LOW (ref >90)
GFR calc non Af Amer: 29 mL/min — ABNORMAL LOW (ref >90)

## 2011-10-09 LAB — CBC
MCH: 30.7 pg (ref 26.0–34.0)
MCHC: 36.5 g/dL — ABNORMAL HIGH (ref 30.0–36.0)
MCV: 84.1 fL (ref 78.0–100.0)
Platelets: 266 10*3/uL (ref 150–400)
RDW: 13.3 % (ref 11.5–15.5)

## 2011-10-09 LAB — GLUCOSE, CAPILLARY: Glucose-Capillary: 137 mg/dL — ABNORMAL HIGH (ref 70–99)

## 2011-10-09 MED ORDER — FUROSEMIDE 40 MG PO TABS
40.0000 mg | ORAL_TABLET | Freq: Every day | ORAL | Status: DC
  Filled 2011-10-09: qty 1

## 2011-10-09 NOTE — Progress Notes (Signed)
Subjective: More alert today, eating breakfast, oxygenation improved  Objective: Vital signs in last 24 hours: Filed Vitals:   10/08/11 1353 10/08/11 1955 10/08/11 2100 10/09/11 0451  BP: 157/64  162/76 135/70  Pulse: 61  112 91  Temp: 99.2 F (37.3 C)  98.4 F (36.9 C) 97.5 F (36.4 C)  TempSrc: Oral  Oral Oral  Resp: 18  18 20   Height:      Weight:    70.1 kg (154 lb 8.7 oz)  SpO2: 93% 92% 100% 95%   Weight change: 0.428 kg (15.1 oz)  Intake/Output Summary (Last 24 hours) at 10/09/11 1013 Last data filed at 10/09/11 0640  Gross per 24 hour  Intake   1550 ml  Output   1750 ml  Net   -200 ml    Physical Exam: General: alert, awake, oriented x2, no acute distress. HEENT: EOMI. Neck: Supple CV: S1 and S2 Lungs: decreased breath sounds at bases,  poor air movement bilaterally Abdomen: Soft, Nontender, Nondistended, +bowel sounds. Ext: Good pulses. 1+ LE edema at ankles, trace edema at mid shin.  Lab Results: Basic Metabolic Panel:  Lab [id number] 0730 10/08/11 0525 10/07/11 0600 10/06/11 0620 10/05/11 0638 10/04/11 0635  NA 125* 125* 124* 125* 126* --  K 2.9* 2.3* 3.0* 3.2* 3.0* --  CL 87* 86* 87* 88* 90* --  CO2 24 25 24 23 23  --  GLUCOSE 148* 175* 165* 142* 186* --  BUN 55* 41* 32* 27* 26* --  CREATININE 1.99* 1.83* 1.66* 1.47* 1.37* --  CALCIUM 8.2* 8.2* 8.3* 8.2* 8.3* --  MG -- -- -- 1.7 -- 1.9  PHOS -- -- -- -- -- --   Liver Function Tests:  Lab 10/02/11 1938  AST 34  ALT 30  ALKPHOS 110  BILITOT 1.2  PROT 7.7  ALBUMIN 2.3*   No results found for this basename: LIPASE:5,AMYLASE:5 in the last 168 hours  Lab 10/06/11 0927  AMMONIA 22   CBC:  Lab 10/09/11 0730 10/08/11 0525 10/07/11 0600 10/06/11 0620 10/05/11 0638 10/02/11 1938  WBC 8.5 11.3* 10.6* 6.3 7.7 --  NEUTROABS -- -- -- -- -- 8.7*  HGB 8.1* 8.8* 9.5* 9.3* 10.4* --  HCT 22.2* 23.9* 26.1* 25.7* 28.6* --  MCV 84.1 85.7 85.6 87.4 86.7 --  PLT 266 288 269 280 264 --   Cardiac  Enzymes: No results found for this basename: CKTOTAL:5,CKMB:5,CKMBINDEX:5,TROPONINI:5 in the last 168 hours BNP (last 3 results)  Basename 10/02/11 1938 10/01/11 1036 03/30/11 1055  PROBNP 396.1 1962.0* 27.0   CBG:  Lab 10/09/11 0710 10/08/11 2212 10/08/11 1612 10/08/11 1308 10/08/11 0740  GLUCAP 155* 173* 206* 132* 186*   No results found for this basename: HGBA1C:5 in the last 72 hours Other Labs: No components found with this basename: POCBNP:3 No results found for this basename: DDIMER:2 in the last 168 hours No results found for this basename: CHOL:2,HDL:2,LDLCALC:2,TRIG:2,CHOLHDL:2,LDLDIRECT:2 in the last 168 hours  Lab 10/03/11 0710  TSH 0.892  T4TOTAL --  T3FREE --  FREET4 --  THYROIDAB --   No results found for this basename: VITAMINB12:2,FOLATE:2,FERRITIN:2,TIBC:2,IRON:2,RETICCTPCT:2 in the last 168 hours  Micro Results: No results found for this or any previous visit (from the past 240 hour(s)).  Studies/Results: No results found.  Medications: I have reviewed the patient's current medications. Scheduled Meds:    . aspirin EC  81 mg Oral Daily  . atorvastatin  20 mg Oral Daily  . calcitRIOL  0.25 mcg Oral Daily  . cyanocobalamin  1,000 mcg Intramuscular Q30 days  . demeclocycline  150 mg Oral Q12H  . furosemide  80 mg Intravenous Q12H  . heparin  5,000 Units Subcutaneous Q8H  . hydrALAZINE  50 mg Oral Q8H  . insulin aspart  0-9 Units Subcutaneous TID WC  . insulin aspart protamine-insulin aspart  25 Units Subcutaneous BID WC  . isosorbide mononitrate  10 mg Oral BID WC  . levalbuterol  0.63 mg Nebulization TID  . levofloxacin (LEVAQUIN) IV  750 mg Intravenous Q48H  . pantoprazole  40 mg Oral Q1200  . potassium chloride  10 mEq Intravenous Q1 Hr x 4  . potassium chloride  40 mEq Oral BID  . prednisoLONE acetate  1 drop Both Eyes Daily  . senna-docusate  1 tablet Oral BID  . sodium chloride  3 mL Intravenous Q12H  . DISCONTD: furosemide  80 mg  Intravenous Q6H  . DISCONTD: hydrALAZINE  25 mg Oral Q8H  . DISCONTD: metolazone  2.5 mg Oral BID   Continuous Infusions:  PRN Meds:.acetaminophen, lactulose, levalbuterol, polyethylene glycol, traMADol  Assessment/Plan: Bilateral lower extremity edema likely due to decompensated acute on chronic diastolic heart failure and CKD 2-D echocardiogram on 04/21/2011 which showed systolic function was normal, ejection fraction 55-60%, wall motion was normal, there were normal no wall motion abnormalities.  Diuresis per Renal, ? Decrease dose.  Metabolic encephalopathy: suspect from Pneumonia, and hyponatremia, finally starting to improve  Pneumonia/ Left lower lobe consolidation with effusion on CXR and CT abd pelvis: started IV levaquin 6/19, repeat CXR with increasing L lung consolidation, continue xopenex nebs Clinically improving, afebrile now, WBC normalized, oxygen requirements improved as well  Hyponatremia Multifactorial, hypervolemia, SIADH-pneumonia, meds, appreciate renal input, TSH ok, random cortisol ok/high  DM (diabetes mellitus), type 1, uncontrolled w/ophthalmic complication NovoLog 0000000 25 units subcutaneous twice daily.  Continue SSI. Stable.  Anemia Hemoglobin stable.  Maybe due to CKD, acute illness, blood draws, B12 deficiency continue B12 injections.  Chronic kidney disease stage III stable  Generalized weakness Continue PT/OT recommending SNF.  Hyperlipidemia Continue statin.  Prophylaxis Subcutaneous heparin.  Code status Full code  Disposition Pending SNF placement,  Called and discussed with Niece: [name] 6/19, will call her today    LOS: 7 days  [name], MD 10/09/2011, 10:13 AM Pager: [phone]

## 2011-10-09 NOTE — Progress Notes (Signed)
Subjective:  Feeding himself breakfast, no complaints. I/O were 2130 in/2425 out yesterday. Objective Vital signs in last 24 hours: Filed Vitals:   10/08/11 1353 10/08/11 1955 10/08/11 2100 10/09/11 0451  BP: 157/64  162/76 135/70  Pulse: 61  112 91  Temp: 99.2 F (37.3 C)  98.4 F (36.9 C) 97.5 F (36.4 C)  TempSrc: Oral  Oral Oral  Resp: 18  18 20   Height:      Weight:    70.1 kg (154 lb 8.7 oz)  SpO2: 93% 92% 100% 95%   Weight change: 0.428 kg (15.1 oz)  Intake/Output Summary (Last 24 hours) at 10/09/11 0946 Last data filed at 10/09/11 0640  Gross per 24 hour  Intake   1550 ml  Output   1750 ml  Net   -200 ml   Labs: Basic Metabolic Panel:  Lab [id number] 0730 10/08/11 0525 10/07/11 0600  NA 125* 125* 124*  K 2.9* 2.3* 3.0*  CL 87* 86* 87*  CO2 24 25 24   GLUCOSE 148* 175* 165*  BUN 55* 41* 32*  CREATININE 1.99* 1.83* 1.66*  CALCIUM 8.2* 8.2* 8.3*  ALB -- -- --  PHOS -- -- --   Liver Function Tests:  Lab 10/02/11 1938  AST 34  ALT 30  ALKPHOS 110  BILITOT 1.2  PROT 7.7  ALBUMIN 2.3*   No results found for this basename: LIPASE:3,AMYLASE:3 in the last 168 hours  Lab 10/06/11 0927  AMMONIA 22   CBC:  Lab 10/09/11 0730 10/08/11 0525 10/07/11 0600 10/06/11 0620 10/05/11 0638 10/02/11 1938  WBC 8.5 11.3* 10.6* -- -- --  NEUTROABS -- -- -- -- -- 8.7*  HGB 8.1* 8.8* 9.5* -- -- --  HCT 22.2* 23.9* 26.1* -- -- --  MCV 84.1 85.7 85.6 87.4 86.7 --  PLT 266 288 269 -- -- --   Cardiac Enzymes: No results found for this basename: CKTOTAL:5,CKMB:5,CKMBINDEX:5,TROPONINI:5 in the last 168 hours CBG:  Lab 10/09/11 0710 10/08/11 2212 10/08/11 1612 10/08/11 1308 10/08/11 0740  GLUCAP 155* 173* 206* 132* 186*    Iron Studies: No results found for this basename: IRON,TIBC,TRANSFERRIN,FERRITIN in the last 72 hours Studies/Results: No results found. Medications: Infusions:    Scheduled Medications:    . aspirin EC  81 mg Oral Daily  . atorvastatin  20 mg  Oral Daily  . calcitRIOL  0.25 mcg Oral Daily  . cyanocobalamin  1,000 mcg Intramuscular Q30 days  . demeclocycline  150 mg Oral Q12H  . furosemide  80 mg Intravenous Q12H  . heparin  5,000 Units Subcutaneous Q8H  . hydrALAZINE  50 mg Oral Q8H  . insulin aspart  0-9 Units Subcutaneous TID WC  . insulin aspart protamine-insulin aspart  25 Units Subcutaneous BID WC  . isosorbide mononitrate  10 mg Oral BID WC  . levalbuterol  0.63 mg Nebulization TID  . levofloxacin (LEVAQUIN) IV  750 mg Intravenous Q48H  . pantoprazole  40 mg Oral Q1200  . potassium chloride  10 mEq Intravenous Q1 Hr x 4  . potassium chloride  40 mEq Oral BID  . prednisoLONE acetate  1 drop Both Eyes Daily  . senna-docusate  1 tablet Oral BID  . sodium chloride  3 mL Intravenous Q12H  . DISCONTD: furosemide  80 mg Intravenous Q6H  . DISCONTD: hydrALAZINE  25 mg Oral Q8H  . DISCONTD: metolazone  2.5 mg Oral BID    have reviewed scheduled and prn medications.  Physical Exam: General: alert, but slow to  answer questions, no JVD Heart: RRR Lungs: poor effort, hypoxic, but O2 req is down. Occ upper airway sounds, no rales.  Abdomen: soft,, non tender Extremities: still with edema but seems better   I Assessment/ Plan: Pt is a 78 y.o. yo male who was admitted on 10/02/2011 with edema and hyponatremia  Assessment/Plan: #1 Hyponatremia:  Stable.  has an element of hypervolemic hyponatremia which is being appropriately treated with diuresis. Given his elevated urinary osmolality he also has features of SIADH. He is on 1500 cc fluid restriction, have added demeclocycline as well for continued and worsening sodium.  His baseline sodium lately at best is been in the low 130s so I would not expect for his sodium to completely normalize.  #2 Hypokalemia: increase repletion to 80 q day.  Agree with IV repletion today as well.  #3 Chronic kidney disease: Also seemingly worsening vs just unmasking of CKD with diuresis.  Continue  diuretics, prob change to po soon.  #4 Anemia: Likely secondary to hospitalization/frequent blood draws/chronic kidney disease. No intervention is needed at this time.  #5- decreased MS- not sure if only due to hyponatremia or other etiology/PNA. I am concerned about his failure to thrive with appropriate therapies.  Consider discussion of goals of care.   #6. Has bed at [facility].  Not stable for SNF  #7. HTN- will increase hydralazine.      [place] D   10/09/2011,9:46 AM  LOS: 7 days

## 2011-10-10 ENCOUNTER — Inpatient Hospital Stay (HOSPITAL_COMMUNITY): Payer: Medicare Other

## 2011-10-10 DIAGNOSIS — R609 Edema, unspecified: Secondary | ICD-10-CM

## 2011-10-10 DIAGNOSIS — D696 Thrombocytopenia, unspecified: Secondary | ICD-10-CM

## 2011-10-10 DIAGNOSIS — E1065 Type 1 diabetes mellitus with hyperglycemia: Secondary | ICD-10-CM

## 2011-10-10 DIAGNOSIS — E871 Hypo-osmolality and hyponatremia: Secondary | ICD-10-CM

## 2011-10-10 LAB — CBC
HCT: 24.8 % — ABNORMAL LOW (ref 39.0–52.0)
Hemoglobin: 9.1 g/dL — ABNORMAL LOW (ref 13.0–17.0)
MCH: 31.4 pg (ref 26.0–34.0)
MCHC: 36.7 g/dL — ABNORMAL HIGH (ref 30.0–36.0)
RDW: 13.6 % (ref 11.5–15.5)

## 2011-10-10 LAB — BASIC METABOLIC PANEL
BUN: 70 mg/dL — ABNORMAL HIGH (ref 6–23)
Calcium: 8.6 mg/dL (ref 8.4–10.5)
GFR calc Af Amer: 28 mL/min — ABNORMAL LOW (ref >90)
GFR calc non Af Amer: 25 mL/min — ABNORMAL LOW (ref >90)
Glucose, Bld: 110 mg/dL — ABNORMAL HIGH (ref 70–99)

## 2011-10-10 LAB — GLUCOSE, CAPILLARY
Glucose-Capillary: 165 mg/dL — ABNORMAL HIGH (ref 70–99)
Glucose-Capillary: 67 mg/dL — ABNORMAL LOW (ref 70–99)
Glucose-Capillary: 75 mg/dL (ref 70–99)

## 2011-10-10 MED ORDER — POTASSIUM CHLORIDE CRYS ER 20 MEQ PO TBCR
40.0000 meq | EXTENDED_RELEASE_TABLET | Freq: Every day | ORAL | Status: DC
  Administered 2011-10-10 – 2011-10-18 (×9): 40 meq via ORAL
  Filled 2011-10-10 (×2): qty 2
  Filled 2011-10-10: qty 1
  Filled 2011-10-10 (×4): qty 2
  Filled 2011-10-10: qty 1
  Filled 2011-10-10 (×2): qty 2

## 2011-10-10 MED ORDER — FUROSEMIDE 20 MG PO TABS
20.0000 mg | ORAL_TABLET | Freq: Every day | ORAL | Status: DC
  Filled 2011-10-10: qty 1

## 2011-10-10 MED ORDER — FUROSEMIDE 40 MG PO TABS
40.0000 mg | ORAL_TABLET | Freq: Every day | ORAL | Status: DC
  Administered 2011-10-10 – 2011-10-11 (×2): 40 mg via ORAL
  Filled 2011-10-10 (×2): qty 1

## 2011-10-10 NOTE — Progress Notes (Signed)
Subjective: A little lethargic, breathing better  Objective: Vital signs in last 24 hours: Filed Vitals:   10/09/11 2042 10/09/11 2124 10/09/11 2204 10/10/11 0623  BP:  131/69 149/63 146/69  Pulse:  103 102 94  Temp:   98.2 F (36.8 C) 98.5 F (36.9 C)  TempSrc:   Oral Oral  Resp:  18 20 20   Height:      Weight:    70.4 kg (155 lb 3.3 oz)  SpO2: 95%  96% 98%   Weight change: 0.3 kg (10.6 oz)  Intake/Output Summary (Last 24 hours) at 10/10/11 0939 Last data filed at 10/10/11 [id number]  Gross per 24 hour  Intake    483 ml  Output    700 ml  Net   -217 ml    Physical Exam: General: lethargic, easily aroused, oriented x2, no acute distress. HEENT: EOMI. Neck: Supple CV: S1 and S2 Lungs: crackles in L LL, decreased breath sounds at bases,  Abdomen: Soft, Nontender, Nondistended, +bowel sounds. Ext: Good pulses. 1+ LE edema at ankles, trace edema at mid shin.  Lab Results: Basic Metabolic Panel:  Lab [id number] 0615 10/09/11 0730 10/08/11 0525 10/07/11 0600 10/06/11 0620 10/04/11 0635  NA 126* 125* 125* 124* 125* --  K 3.5 2.9* 2.3* 3.0* 3.2* --  CL 88* 87* 86* 87* 88* --  CO2 23 24 25 24 23  --  GLUCOSE 110* 148* 175* 165* 142* --  BUN 70* 55* 41* 32* 27* --  CREATININE 2.28* 1.99* 1.83* 1.66* 1.47* --  CALCIUM 8.6 8.2* 8.2* 8.3* 8.2* --  MG -- -- -- -- 1.7 1.9  PHOS -- -- -- -- -- --   Liver Function Tests: No results found for this basename: AST:5,ALT:5,ALKPHOS:5,BILITOT:5,PROT:5,ALBUMIN:5 in the last 168 hours No results found for this basename: LIPASE:5,AMYLASE:5 in the last 168 hours  Lab 10/06/11 0927  AMMONIA 22   CBC:  Lab 10/10/11 0615 10/09/11 0730 10/08/11 0525 10/07/11 0600 10/06/11 0620  WBC 9.2 8.5 11.3* 10.6* 6.3  NEUTROABS -- -- -- -- --  HGB 9.1* 8.1* 8.8* 9.5* 9.3*  HCT 24.8* 22.2* 23.9* 26.1* 25.7*  MCV 85.5 84.1 85.7 85.6 87.4  PLT 245 266 288 269 280   Cardiac Enzymes: No results found for this basename:  CKTOTAL:5,CKMB:5,CKMBINDEX:5,TROPONINI:5 in the last 168 hours BNP (last 3 results)  Basename 10/02/11 1938 10/01/11 1036 03/30/11 1055  PROBNP 396.1 1962.0* 27.0   CBG:  Lab 10/09/11 2208 10/09/11 1642 10/09/11 1119 10/09/11 0710 10/08/11 2212  GLUCAP 115* 88 137* 155* 173*   No results found for this basename: HGBA1C:5 in the last 72 hours Other Labs: No components found with this basename: POCBNP:3 No results found for this basename: DDIMER:2 in the last 168 hours No results found for this basename: CHOL:2,HDL:2,LDLCALC:2,TRIG:2,CHOLHDL:2,LDLDIRECT:2 in the last 168 hours No results found for this basename: TSH,T4TOTAL,FREET3,T3FREE,FREET4,THYROIDAB in the last 168 hours No results found for this basename: VITAMINB12:2,FOLATE:2,FERRITIN:2,TIBC:2,IRON:2,RETICCTPCT:2 in the last 168 hours  Micro Results: No results found for this or any previous visit (from the past 240 hour(s)).  Studies/Results: Dg Chest 2 View  10/10/2011  *RADIOLOGY REPORT*  Clinical Data: Follow-up pneumonia.  Cough and congestion.  CHEST - 2 VIEW  Comparison: 10/06/2011  Findings: The cardiomediastinal silhouette is stable with upper limits normal heart size. Airspace disease/consolidation within the left lower lobe is again noted with probable left pleural effusion. Right basilar airspace disease/atelectasis is identified. There is no evidence of pneumothorax. No other changes identified.  IMPRESSION: Relatively unchanged chest radiograph  with continued left lower lobe airspace disease/consolidation and probable effusion.  Stable right basilar atelectasis/airspace disease.  Original Report Authenticated By: Lura Em, M.D.    Medications: I have reviewed the patient's current medications. Scheduled Meds:    . aspirin EC  81 mg Oral Daily  . atorvastatin  20 mg Oral Daily  . calcitRIOL  0.25 mcg Oral Daily  . cyanocobalamin  1,000 mcg Intramuscular Q30 days  . demeclocycline  150 mg Oral Q12H  .  furosemide  40 mg Oral Daily  . heparin  5,000 Units Subcutaneous Q8H  . hydrALAZINE  50 mg Oral Q8H  . insulin aspart  0-9 Units Subcutaneous TID WC  . insulin aspart protamine-insulin aspart  25 Units Subcutaneous BID WC  . isosorbide mononitrate  10 mg Oral BID WC  . levalbuterol  0.63 mg Nebulization TID  . levofloxacin (LEVAQUIN) IV  750 mg Intravenous Q48H  . pantoprazole  40 mg Oral Q1200  . potassium chloride  40 mEq Oral BID  . prednisoLONE acetate  1 drop Both Eyes Daily  . senna-docusate  1 tablet Oral BID  . sodium chloride  3 mL Intravenous Q12H  . DISCONTD: furosemide  80 mg Intravenous Q12H   Continuous Infusions:  PRN Meds:.acetaminophen, lactulose, levalbuterol, polyethylene glycol, traMADol  Assessment/Plan: Bilateral lower extremity edema likely due to decompensated acute on chronic diastolic heart failure and CKD 2-D echocardiogram on 04/21/2011 which showed systolic function was normal, ejection fraction 55-60%, wall motion was normal, there were normal no wall motion abnormalities.  Now on lasix 40mg  po Qday  Metabolic encephalopathy: suspect from Pneumonia, and hyponatremia, finally starting to improve  Pneumonia/ Left lower lobe consolidation with effusion on CXR and CT abd pelvis: started IV levaquin 6/19, repeat CXR with increasing L lung consolidation, continue xopenex nebs Clinically improving, afebrile now, WBC normalized, oxygen requirements improved as well  Hyponatremia Multifactorial, hypervolemia, SIADH-pneumonia, meds, appreciate renal input, TSH ok, random cortisol ok/high  ARF on CKD2: Secondary to over diuresis /ARB, stop ARB, continue to monitor, diuretics per renal  DM (diabetes mellitus), type 1, uncontrolled w/ophthalmic complication NovoLog 0000000 25 units subcutaneous twice daily.  Continue SSI. Stable.  Anemia Hemoglobin stable.  Maybe due to CKD, acute illness, blood draws, B12 deficiency continue B12 injections.  Chronic kidney  disease stage III stable  Generalized weakness Continue PT/OT recommending SNF.  Hyperlipidemia Continue statin.  Prophylaxis Subcutaneous heparin.  Code status Full code  Disposition Pending SNF placement,  Called and discussed with Niece: [name] 6/19 ,6/22    LOS: 8 days  [name], MD 10/10/2011, 9:39 AM Pager: [phone]

## 2011-10-10 NOTE — Progress Notes (Signed)
Patient ID: JAMAIR NOFTZ, male   DOB: [birth year], 78 y.o.   MRN: [id number]   [facility] Progress Note    Subjective:   Reports to be feeling weak and denies SOB   Objective:   BP 146/69  Pulse 94  Temp 98.5 F (36.9 C) (Oral)  Resp 20  Ht 5\' 10"  (1.778 m)  Wt 70.4 kg (155 lb 3.3 oz)  BMI 22.27 kg/m2  SpO2 98%  Intake/Output Summary (Last 24 hours) at 10/10/11 0940 Last data filed at 10/10/11 [id number]  Gross per 24 hour  Intake    483 ml  Output    700 ml  Net   -217 ml   Weight change: 0.3 kg (10.6 oz)  Physical Exam: [id number] resting in bed- slow but responds appropriately to questions [id number] RRR, normal S1 and S2  Resp:Poor inspiratory effort- no obvious rales/rhonchi [id number], flat, NT, Bs normal [id number] LE edema  Imaging: Dg Chest 2 View  10/10/2011  *RADIOLOGY REPORT*  Clinical Data: Follow-up pneumonia.  Cough and congestion.  CHEST - 2 VIEW  Comparison: 10/06/2011  Findings: The cardiomediastinal silhouette is stable with upper limits normal heart size. Airspace disease/consolidation within the left lower lobe is again noted with probable left pleural effusion. Right basilar airspace disease/atelectasis is identified. There is no evidence of pneumothorax. No other changes identified.  IMPRESSION: Relatively unchanged chest radiograph with continued left lower lobe airspace disease/consolidation and probable effusion.  Stable right basilar atelectasis/airspace disease.  Original Report Authenticated By: Lura Em, M.D.    Labs: BMET  Lab 10/10/11 0615 10/09/11 0730 10/08/11 0525 10/07/11 0600 10/06/11 0620 10/05/11 0638 10/04/11 0635  NA 126* 125* 125* 124* 125* 126* 124*  K 3.5 2.9* 2.3* 3.0* 3.2* 3.0* 3.6  CL 88* 87* 86* 87* 88* 90* 89*  CO2 23 24 25 24 23 23 22   GLUCOSE 110* 148* 175* 165* 142* 186* 147*  BUN 70* 55* 41* 32* 27* 26* 27*  CREATININE 2.28* 1.99* 1.83* 1.66* 1.47* 1.37* 1.36*  ALB -- -- -- -- -- -- --    CALCIUM 8.6 8.2* 8.2* 8.3* 8.2* 8.3* 8.5  PHOS -- -- -- -- -- -- --   CBC  Lab 10/10/11 0615 10/09/11 0730 10/08/11 0525 10/07/11 0600  WBC 9.2 8.5 11.3* 10.6*  NEUTROABS -- -- -- --  HGB 9.1* 8.1* 8.8* 9.5*  HCT 24.8* 22.2* 23.9* 26.1*  MCV 85.5 84.1 85.7 85.6  PLT 245 266 288 269    Medications:      . aspirin EC  81 mg Oral Daily  . atorvastatin  20 mg Oral Daily  . calcitRIOL  0.25 mcg Oral Daily  . cyanocobalamin  1,000 mcg Intramuscular Q30 days  . demeclocycline  150 mg Oral Q12H  . furosemide  20 mg Oral Daily  . heparin  5,000 Units Subcutaneous Q8H  . hydrALAZINE  50 mg Oral Q8H  . insulin aspart  0-9 Units Subcutaneous TID WC  . insulin aspart protamine-insulin aspart  25 Units Subcutaneous BID WC  . isosorbide mononitrate  10 mg Oral BID WC  . levalbuterol  0.63 mg Nebulization TID  . levofloxacin (LEVAQUIN) IV  750 mg Intravenous Q48H  . pantoprazole  40 mg Oral Q1200  . potassium chloride  40 mEq Oral Daily  . prednisoLONE acetate  1 drop Both Eyes Daily  . senna-docusate  1 tablet Oral BID  . sodium chloride  3 mL Intravenous Q12H  . DISCONTD: furosemide  80 mg  Intravenous Q12H  . DISCONTD: furosemide  40 mg Oral Daily  . DISCONTD: potassium chloride  40 mEq Oral BID     Assessment/ Plan:   #1 Hyponatremia: With a elements of hypervolemic hyponatremia being treated with diuresis, inappropriately elevated ADH (High Uosm) and malnutrition with likely reset osmostat.  He is on 1500 cc fluid restriction and demeclocycline as well for continued sodium management. His baseline sodium lately at best has been in the low 130s so would expect for him to level out there.  #2 Hypokalemia: decrease repletion to 40 q day- .  #3 Chronic kidney disease: Also seemingly worsening vs just unmasking of CKD with diuresis. Continue current diuretics, off ARB.  #4 Anemia: Likely secondary to hospitalization/frequent blood draws/chronic kidney disease. No intervention is needed  at this time.  #5- Altered MS- not sure if only due to hyponatremia or acute changes due to PNA. Consider discussion of goals of care with what appears to be FTT.  #6. Disposition: continue to await medical stability prior to DC to SNF #7. HTN- monitor on hydralazine.    Elmarie Shiley, MD 10/10/2011, 9:40 AM

## 2011-10-10 NOTE — Progress Notes (Signed)
CBG: 67  Treatment: 15 GM carbohydrate snack  Symptoms: None  Follow-up CBG: Time:2311 CBG Result:75  Possible Reasons for Event: Inadequate meal intake  Comments/MD notified:    Britt Bolognese

## 2011-10-11 ENCOUNTER — Inpatient Hospital Stay (HOSPITAL_COMMUNITY): Payer: Medicare Other

## 2011-10-11 DIAGNOSIS — D696 Thrombocytopenia, unspecified: Secondary | ICD-10-CM

## 2011-10-11 DIAGNOSIS — E871 Hypo-osmolality and hyponatremia: Secondary | ICD-10-CM

## 2011-10-11 DIAGNOSIS — E1065 Type 1 diabetes mellitus with hyperglycemia: Secondary | ICD-10-CM

## 2011-10-11 DIAGNOSIS — R609 Edema, unspecified: Secondary | ICD-10-CM

## 2011-10-11 LAB — GLUCOSE, CAPILLARY
Glucose-Capillary: 138 mg/dL — ABNORMAL HIGH (ref 70–99)
Glucose-Capillary: 158 mg/dL — ABNORMAL HIGH (ref 70–99)

## 2011-10-11 LAB — CBC
HCT: 22.7 % — ABNORMAL LOW (ref 39.0–52.0)
MCH: 31.1 pg (ref 26.0–34.0)
MCHC: 37 g/dL — ABNORMAL HIGH (ref 30.0–36.0)
MCV: 84.1 fL (ref 78.0–100.0)
RDW: 13.3 % (ref 11.5–15.5)
WBC: 9 10*3/uL (ref 4.0–10.5)

## 2011-10-11 LAB — BASIC METABOLIC PANEL
CO2: 22 mEq/L (ref 19–32)
Chloride: 86 mEq/L — ABNORMAL LOW (ref 96–112)
Glucose, Bld: 116 mg/dL — ABNORMAL HIGH (ref 70–99)
Potassium: 4.2 mEq/L (ref 3.5–5.1)
Sodium: 122 mEq/L — ABNORMAL LOW (ref 135–145)

## 2011-10-11 MED ORDER — INSULIN ASPART PROT & ASPART (70-30 MIX) 100 UNIT/ML ~~LOC~~ SUSP
20.0000 [IU] | Freq: Two times a day (BID) | SUBCUTANEOUS | Status: DC
  Administered 2011-10-11 – 2011-10-17 (×11): 20 [IU] via SUBCUTANEOUS
  Filled 2011-10-11 (×2): qty 3

## 2011-10-11 MED ORDER — DARBEPOETIN ALFA-POLYSORBATE 100 MCG/0.5ML IJ SOLN
100.0000 ug | INTRAMUSCULAR | Status: DC
  Administered 2011-10-11 – 2011-10-25 (×3): 100 ug via SUBCUTANEOUS
  Filled 2011-10-11 (×3): qty 0.5

## 2011-10-11 MED ORDER — FUROSEMIDE 80 MG PO TABS
80.0000 mg | ORAL_TABLET | Freq: Two times a day (BID) | ORAL | Status: DC
  Administered 2011-10-11 – 2011-10-12 (×2): 80 mg via ORAL
  Filled 2011-10-11 (×4): qty 1

## 2011-10-11 MED ORDER — INSULIN ASPART 100 UNIT/ML ~~LOC~~ SOLN
5.0000 [IU] | Freq: Once | SUBCUTANEOUS | Status: AC
  Administered 2011-10-11: 5 [IU] via SUBCUTANEOUS

## 2011-10-11 NOTE — Progress Notes (Signed)
[name]  Subjective:  Sitting in chair with legs elevated   Objective: Vital signs in last 24 hours: Blood pressure 133/67, pulse 87, temperature 97.6 F (36.4 C), temperature source Oral, resp. rate 18, height 5\' 10"  (1.778 m), weight 71.2 kg (156 lb 15.5 oz), SpO2 96.00%.    PHYSICAL EXAM General--awake, knows Dr. Renato [name] is his primary doc Chest--rhonchi Heart--no rub Abd--nontender Extr--2-3+ pretibial edema bilat.  Lab Results:   Lab 10/11/11 0635 10/10/11 0615 10/09/11 0730  NA 122* 126* 125*  K 4.2 3.5 2.9*  CL 86* 88* 87*  CO2 22 23 24   BUN 81* 70* 55*  CREATININE 2.47* 2.28* 1.99*  ALB -- -- --  GLUCOSE 116* -- --  CALCIUM 8.5 8.6 8.2*  PHOS -- -- --     Basename 10/11/11 0833 10/10/11 0615  WBC 9.0 9.2  HGB 8.4* 9.1*  HCT 22.7* 24.8*  PLT 242 245   Dr [name] reports that weight was 70 kg in Jan 2013 (weight currently 71 kg and weight was 71 kg on 16 Jun).  Cr was 1.36 in Jan 2013 and Cr has been increasing since admission.    Assessment/Plan: 1.   Hyponatremia: With a elements of hypervolemic hyponatremia being treated with diuresis, inappropriately elevated ADH (High Uosm) and malnutrition with likely reset osmostat. He is on 1500 cc fluid restriction and demeclocycline as well for continued sodium management. His baseline sodium lately at best has been in the low 130s so would expect for him to level out there. I think he's still vol overloaded with edema and vascular congestion on CXR, Will increase dose of furosemide (although it may worsen renal function).   May need echo to asses EF.  Check uric acid. 2.   Hypokalemia: decreaseKCl to 40 q day- .  3.   Chronic kidney disease: Also seemingly worsening vs just unmasking of CKD with diuresis. Increase diuretics, off ARB. WEill check renal US as well as spep and upep 4.   Anemia: Likely secondary to hospitalization/frequent blood draws/chronic kidney disease. No intervention is  needed at this time.  FeTIBC 25% sat, ferritin 622.  Will begin aranesp 5.   Altered MS- not sure if only due to hyponatremia or acute changes due to PNA. Consider discussion of goals of care with what appears to be FTT.  6.  Disposition: continue to await medical stability prior to DC to SNF  7.  HTN- monitor on hydralazine.     LOS: 9 days   [name] F 10/11/2011,2:34 PM   .labalb

## 2011-10-11 NOTE — Progress Notes (Addendum)
Subjective: Still tired appearing, denies SoB, sat up in chair yesterday  Objective: Vital signs in last 24 hours: Filed Vitals:   10/10/11 2141 10/11/11 0541 10/11/11 0738 10/11/11 1028  BP: 150/60 143/73  147/69  Pulse: 100 90  92  Temp: 99.1 F (37.3 C) 98.6 F (37 C)  97.8 F (36.6 C)  TempSrc: Oral Oral  Oral  Resp: 20 20  20   Height:      Weight:  71.2 kg (156 lb 15.5 oz)    SpO2: 96% 96% 97% 95%   Weight change: 0.8 kg (1 lb 12.2 oz)  Intake/Output Summary (Last 24 hours) at 10/11/11 1122 Last data filed at 10/11/11 1035  Gross per 24 hour  Intake   1086 ml  Output    575 ml  Net    511 ml    Physical Exam: General:a little lethargic, answers questions appropriately aroused, oriented x2, no acute distress. HEENT: EOMI. Neck: Supple CV: S1 and S2 Lungs: crackles at L base  Abdomen: Soft, Nontender, Nondistended, +bowel sounds. Ext: Good pulses. 1+ LE edema at ankles, trace edema at mid shin.  Lab Results: Basic Metabolic Panel:  Lab [id number] 0635 10/10/11 0615 10/09/11 0730 10/08/11 0525 10/07/11 0600 10/06/11 0620  NA 122* 126* 125* 125* 124* --  K 4.2 3.5 2.9* 2.3* 3.0* --  CL 86* 88* 87* 86* 87* --  CO2 22 23 24 25 24  --  GLUCOSE 116* 110* 148* 175* 165* --  BUN 81* 70* 55* 41* 32* --  CREATININE 2.47* 2.28* 1.99* 1.83* 1.66* --  CALCIUM 8.5 8.6 8.2* 8.2* 8.3* --  MG -- -- -- -- -- 1.7  PHOS -- -- -- -- -- --   Liver Function Tests: No results found for this basename: AST:5,ALT:5,ALKPHOS:5,BILITOT:5,PROT:5,ALBUMIN:5 in the last 168 hours No results found for this basename: LIPASE:5,AMYLASE:5 in the last 168 hours  Lab 10/06/11 0927  AMMONIA 22   CBC:  Lab 10/11/11 0833 10/10/11 0615 10/09/11 0730 10/08/11 0525 10/07/11 0600  WBC 9.0 9.2 8.5 11.3* 10.6*  NEUTROABS -- -- -- -- --  HGB 8.4* 9.1* 8.1* 8.8* 9.5*  HCT 22.7* 24.8* 22.2* 23.9* 26.1*  MCV 84.1 85.5 84.1 85.7 85.6  PLT 242 245 266 288 269   Cardiac Enzymes: No results found for  this basename: CKTOTAL:5,CKMB:5,CKMBINDEX:5,TROPONINI:5 in the last 168 hours BNP (last 3 results)  Basename 10/02/11 1938 10/01/11 1036 03/30/11 1055  PROBNP 396.1 1962.0* 27.0   CBG:  Lab 10/11/11 0626 10/10/11 2311 10/10/11 2245 10/10/11 1606 10/10/11 1104  GLUCAP 118* 75 67* 165* 166*   No results found for this basename: HGBA1C:5 in the last 72 hours Other Labs: No components found with this basename: POCBNP:3 No results found for this basename: DDIMER:2 in the last 168 hours No results found for this basename: CHOL:2,HDL:2,LDLCALC:2,TRIG:2,CHOLHDL:2,LDLDIRECT:2 in the last 168 hours No results found for this basename: TSH,T4TOTAL,FREET3,T3FREE,FREET4,THYROIDAB in the last 168 hours No results found for this basename: VITAMINB12:2,FOLATE:2,FERRITIN:2,TIBC:2,IRON:2,RETICCTPCT:2 in the last 168 hours  Micro Results: No results found for this or any previous visit (from the past 240 hour(s)).  Studies/Results: Dg Chest 2 View  10/10/2011  *RADIOLOGY REPORT*  Clinical Data: Follow-up pneumonia.  Cough and congestion.  CHEST - 2 VIEW  Comparison: 10/06/2011  Findings: The cardiomediastinal silhouette is stable with upper limits normal heart size. Airspace disease/consolidation within the left lower lobe is again noted with probable left pleural effusion. Right basilar airspace disease/atelectasis is identified. There is no evidence of pneumothorax. No other  changes identified.  IMPRESSION: Relatively unchanged chest radiograph with continued left lower lobe airspace disease/consolidation and probable effusion.  Stable right basilar atelectasis/airspace disease.  Original Report Authenticated By: Lura Em, M.D.    Medications: I have reviewed the patient's current medications. Scheduled Meds:    . aspirin EC  81 mg Oral Daily  . atorvastatin  20 mg Oral Daily  . calcitRIOL  0.25 mcg Oral Daily  . cyanocobalamin  1,000 mcg Intramuscular Q30 days  . demeclocycline  150 mg Oral  Q12H  . furosemide  40 mg Oral Daily  . heparin  5,000 Units Subcutaneous Q8H  . hydrALAZINE  50 mg Oral Q8H  . insulin aspart  0-9 Units Subcutaneous TID WC  . insulin aspart  5 Units Subcutaneous Once  . insulin aspart protamine-insulin aspart  25 Units Subcutaneous BID WC  . isosorbide mononitrate  10 mg Oral BID WC  . levalbuterol  0.63 mg Nebulization TID  . levofloxacin (LEVAQUIN) IV  750 mg Intravenous Q48H  . pantoprazole  40 mg Oral Q1200  . potassium chloride  40 mEq Oral Daily  . prednisoLONE acetate  1 drop Both Eyes Daily  . senna-docusate  1 tablet Oral BID  . sodium chloride  3 mL Intravenous Q12H   Continuous Infusions:  PRN Meds:.acetaminophen, lactulose, levalbuterol, polyethylene glycol, traMADol  Assessment/Plan: Bilateral lower extremity edema likely due to decompensated acute on chronic diastolic heart failure and CKD 2-D echocardiogram on 04/21/2011 which showed systolic function was normal, ejection fraction 55-60%, wall motion was normal, there were normal no wall motion abnormalities.  Now on lasix 40mg  po Qday per renal, ? Stop diuretic, I wonder if he's a little too dry intravascularly now despite peripheral edema  Metabolic encephalopathy: suspect from Pneumonia, and hyponatremia, I wonder if Uremia is contributing now, BUN up from 24 on admission to 81 now  Pneumonia/ Left lower lobe consolidation with effusion on CXR and CT abd pelvis: started IV levaquin 6/19, repeat CXR with increasing L lung consolidation, continue xopenex nebs Clinically improving, afebrile now, WBC normalized, oxygen requirements improved as well  Hyponatremia Multifactorial,  No longer volume overloaded,  ? Component of Hypovolemia now, ? Third spacing, Albumin was 2.3 on admission Await renal input  ARF on CKD2: Secondary to over diuresis /ARB, ARB, continue to monitor, stop diuretics if ok with renal  Adult failure to thrive: from 1,2,3,4,5  DM (diabetes mellitus), type  1, uncontrolled w/ophthalmic complication NovoLog 0000000, cut down to 20 units subcutaneous twice daily.  Continue SSI.   Anemia  Maybe due to CKD, acute illness, blood draws, B12 deficiency continue B12 injections.  Chronic kidney disease stage III stable  Generalized weakness Continue PT/OT recommending SNF.  Hyperlipidemia Continue statin.  Prophylaxis Subcutaneous heparin.  Code status Full code  Disposition Pending SNF placement,  Called and discussed with Niece: [name] 6/19 ,6/22    LOS: 9 days  [name], MD 10/11/2011, 11:22 AM Pager: [phone]

## 2011-10-11 NOTE — Progress Notes (Signed)
Paged MD regarding pt's am dose of Novolog 70/30 20 units. Pt had hypoglycemic episode last night with blood sugar of 67. This morning blood sugar was 118 but pt not eating breakfast. MD ordered to hold Novolog 70/30 dose this morning and she will put in an order for 5 units of Novolog insulin to be given now. Will continue to monitor pt closely.

## 2011-10-12 DIAGNOSIS — E1065 Type 1 diabetes mellitus with hyperglycemia: Secondary | ICD-10-CM

## 2011-10-12 DIAGNOSIS — R609 Edema, unspecified: Secondary | ICD-10-CM

## 2011-10-12 DIAGNOSIS — IMO0002 Reserved for concepts with insufficient information to code with codable children: Secondary | ICD-10-CM

## 2011-10-12 DIAGNOSIS — D696 Thrombocytopenia, unspecified: Secondary | ICD-10-CM

## 2011-10-12 DIAGNOSIS — E871 Hypo-osmolality and hyponatremia: Secondary | ICD-10-CM

## 2011-10-12 LAB — BASIC METABOLIC PANEL
CO2: 21 mEq/L (ref 19–32)
Calcium: 8.3 mg/dL — ABNORMAL LOW (ref 8.4–10.5)
Creatinine, Ser: 2.42 mg/dL — ABNORMAL HIGH (ref 0.50–1.35)
Glucose, Bld: 157 mg/dL — ABNORMAL HIGH (ref 70–99)

## 2011-10-12 LAB — GLUCOSE, CAPILLARY
Glucose-Capillary: 78 mg/dL (ref 70–99)
Glucose-Capillary: 149 mg/dL — ABNORMAL HIGH (ref 70–99)

## 2011-10-12 LAB — CBC
Hemoglobin: 8.8 g/dL — ABNORMAL LOW (ref 13.0–17.0)
MCH: 31.7 pg (ref 26.0–34.0)
MCHC: 36.8 g/dL — ABNORMAL HIGH (ref 30.0–36.0)
MCV: 86 fL (ref 78.0–100.0)
RBC: 2.78 MIL/uL — ABNORMAL LOW (ref 4.22–5.81)

## 2011-10-12 MED ORDER — FUROSEMIDE 10 MG/ML IJ SOLN
100.0000 mg | Freq: Four times a day (QID) | INTRAVENOUS | Status: AC
  Administered 2011-10-12 – 2011-10-14 (×9): 100 mg via INTRAVENOUS
  Filled 2011-10-12 (×10): qty 10

## 2011-10-12 MED ORDER — HYDRALAZINE HCL 50 MG PO TABS
50.0000 mg | ORAL_TABLET | Freq: Two times a day (BID) | ORAL | Status: DC
  Administered 2011-10-12 – 2011-10-17 (×10): 50 mg via ORAL
  Filled 2011-10-12 (×12): qty 1

## 2011-10-12 MED ORDER — LEVALBUTEROL HCL 0.63 MG/3ML IN NEBU
0.6300 mg | INHALATION_SOLUTION | Freq: Two times a day (BID) | RESPIRATORY_TRACT | Status: DC
  Administered 2011-10-13 – 2011-10-22 (×17): 0.63 mg via RESPIRATORY_TRACT
  Filled 2011-10-12 (×23): qty 3

## 2011-10-12 NOTE — Progress Notes (Addendum)
Brief Summary:Eric [name] is an 78 y.o. male with history of hypertension, diabetes, chronic renal insufficiency, hypertension, elevated PSA, lived alone and was actively working, presented to the urgent care clinic complaining of bilateral pedal edema along with been feeling weak.  Evaluation showed hyponatremia with serum sodium of 122, creatinine of 1.5 He was felt to have hyponatremia induced by SIADH and hypervolemia, His volume status is being managed by Renal for last 8 days, he was aggressively diuresed with large doses IV lasix for several days, then demeclocycline was added to his regimen, He was also noted to have pneumonia on LLL consolidation on CXR and CT abd, hence started with Levaquin, has improved from this standpoint. At this point he has worsening ARF, and hyponatremia    Subjective: Fells more weak today, lethargic, denies SoB, sat up in chair yesterday  Objective: Vital signs in last 24 hours: Filed Vitals:   10/11/11 2100 10/11/11 2208 10/12/11 0619 10/12/11 0813  BP:  147/69 145/64   Pulse:  94 95   Temp:  97.6 F (36.4 C) 98.4 F (36.9 C)   TempSrc:  Oral Oral   Resp:  20 20   Height:      Weight:      SpO2: 92% 95% 95% 95%   Weight change:   Intake/Output Summary (Last 24 hours) at 10/12/11 0849 Last data filed at 10/12/11 [id number]  Gross per 24 hour  Intake    841 ml  Output    875 ml  Net    -34 ml    Physical Exam: General:a little lethargic, answers questions appropriately aroused, oriented x2, no acute distress. HEENT: EOMI. Neck: Supple CV: S1 and S2 Lungs: crackles at L base  Abdomen: Soft, Nontender, Nondistended, +bowel sounds. Ext: Good pulses. 1+ LE edema at ankles, trace edema at mid shin.  Lab Results: Basic Metabolic Panel:  Lab [id number] 0605 10/11/11 [id number] 10/10/11 0615 10/09/11 0730 10/08/11 0525 10/06/11 0620  NA 119* 122* 126* 125* 125* --  K 4.0 4.2 3.5 2.9* 2.3* --  CL 83* 86* 88* 87* 86* --  CO2 21 22 23 24 25  --    GLUCOSE 157* 116* 110* 148* 175* --  BUN 86* 81* 70* 55* 41* --  CREATININE 2.42* 2.47* 2.28* 1.99* 1.83* --  CALCIUM 8.3* 8.5 8.6 8.2* 8.2* --  MG -- -- -- -- -- 1.7  PHOS -- -- -- -- -- --   Liver Function Tests: No results found for this basename: AST:5,ALT:5,ALKPHOS:5,BILITOT:5,PROT:5,ALBUMIN:5 in the last 168 hours No results found for this basename: LIPASE:5,AMYLASE:5 in the last 168 hours  Lab 10/06/11 0927  AMMONIA 22   CBC:  Lab 10/12/11 0605 10/11/11 0833 10/10/11 0615 10/09/11 0730 10/08/11 0525  WBC 8.4 9.0 9.2 8.5 11.3*  NEUTROABS -- -- -- -- --  HGB 8.8* 8.4* 9.1* 8.1* 8.8*  HCT 23.9* 22.7* 24.8* 22.2* 23.9*  MCV 86.0 84.1 85.5 84.1 85.7  PLT 256 242 245 266 288   Cardiac Enzymes: No results found for this basename: CKTOTAL:5,CKMB:5,CKMBINDEX:5,TROPONINI:5 in the last 168 hours BNP (last 3 results)  Basename 10/02/11 1938 10/01/11 1036 03/30/11 1055  PROBNP 396.1 1962.0* 27.0   CBG:  Lab 10/12/11 0623 10/11/11 2207 10/11/11 1603 10/11/11 1101 10/11/11 0626  GLUCAP 165* 89 158* 138* 118*   No results found for this basename: HGBA1C:5 in the last 72 hours Other Labs: No components found with this basename: POCBNP:3 No results found for this basename: DDIMER:2 in the last 168 hours  No results found for this basename: CHOL:2,HDL:2,LDLCALC:2,TRIG:2,CHOLHDL:2,LDLDIRECT:2 in the last 168 hours No results found for this basename: TSH,T4TOTAL,FREET3,T3FREE,FREET4,THYROIDAB in the last 168 hours No results found for this basename: VITAMINB12:2,FOLATE:2,FERRITIN:2,TIBC:2,IRON:2,RETICCTPCT:2 in the last 168 hours  Micro Results: No results found for this or any previous visit (from the past 240 hour(s)).  Studies/Results: US Renal  10/11/2011  *RADIOLOGY REPORT*  Clinical Data: Evaluate for potential hydronephrosis.  RENAL/URINARY TRACT ULTRASOUND COMPLETE  Comparison:  CT of the abdomen and pelvis 10/03/2011.  Findings:  Right Kidney:  No hydronephrosis.   Well-preserved cortex.  Normal size and parenchymal echotexture without focal abnormalities.  11.9 cm in length  Left Kidney:  No hydronephrosis.  Well-preserved cortex.  Normal size and parenchymal echotexture without focal abnormalities.  12.0 cm in length  Bladder:  Moderately distended without focal wall abnormalities.  Incidental imaging through the prostate gland demonstrates prostatomegaly (gland measures 5.7 x 3.8 x 5.3 cm).  IMPRESSION:  1.  No focal renal abnormalities.  Specifically, no evidence of hydronephrosis. 2.  Prostatomegaly.  Original Report Authenticated By: Etheleen Mayhew, M.D.    Medications: I have reviewed the patient's current medications. Scheduled Meds:    . aspirin EC  81 mg Oral Daily  . atorvastatin  20 mg Oral Daily  . calcitRIOL  0.25 mcg Oral Daily  . cyanocobalamin  1,000 mcg Intramuscular Q30 days  . darbepoetin (ARANESP) injection - NON-DIALYSIS  100 mcg Subcutaneous Q Mon-1800  . demeclocycline  150 mg Oral Q12H  . furosemide  80 mg Oral BID  . heparin  5,000 Units Subcutaneous Q8H  . hydrALAZINE  50 mg Oral Q8H  . insulin aspart  0-9 Units Subcutaneous TID WC  . insulin aspart  5 Units Subcutaneous Once  . insulin aspart protamine-insulin aspart  20 Units Subcutaneous BID WC  . isosorbide mononitrate  10 mg Oral BID WC  . levalbuterol  0.63 mg Nebulization BID  . levofloxacin (LEVAQUIN) IV  750 mg Intravenous Q48H  . pantoprazole  40 mg Oral Q1200  . potassium chloride  40 mEq Oral Daily  . prednisoLONE acetate  1 drop Both Eyes Daily  . senna-docusate  1 tablet Oral BID  . sodium chloride  3 mL Intravenous Q12H  . DISCONTD: furosemide  40 mg Oral Daily  . DISCONTD: insulin aspart protamine-insulin aspart  25 Units Subcutaneous BID WC  . DISCONTD: levalbuterol  0.63 mg Nebulization TID   Continuous Infusions:  PRN Meds:.acetaminophen, lactulose, levalbuterol, polyethylene glycol, traMADol  Assessment/Plan: Bilateral lower extremity edema  likely due to decompensated acute on chronic diastolic heart failure and CKD 2-D echo 01/13 ejection fraction 0000000, Grade 1 diastolic dysfunction, no valvular abnormalities Renal managing volume status/diuretics  Metabolic encephalopathy: suspect from Pneumonia, and hyponatremia, I wonder if Uremia is contributing now, BUN up from 24 on admission to 86 now  Pneumonia/ Left lower lobe consolidation with effusion on CXR and CT abd pelvis: started IV levaquin 6/19, repeat CXR with increasing L lung consolidation, continue xopenex nebs Clinically improving, afebrile now, WBC normalized, oxygen requirements improved as well DC Levaquin on 6/28 to complete 10d course  Hyponatremia: worsening Multifactorial, Initially volume overload, SIADH Was aggressively diuresed from admission Despite peripheral edema, suspect component of Intravascular depletion now,  ?Third spacing, Albumin was 2.3 on admission On Diuretics and Demeclocycline per Renal  ARF on CKD2: Renal USG with normal parenchyma and no Hydronephrosis, creatinine was 1.2 on admission SPEP/UPEP pending, ECHO as noted above Suspect secondary to diuresis /ARB, ARB stopped 4  days ago, continue to monitor  Adult failure to thrive: from 1,2,3,4,5  DM (diabetes mellitus), type 1, uncontrolled w/ophthalmic complication NovoLog 0000000, cut down to 20 units subcutaneous twice daily.  Continue SSI.   Anemia  Maybe due to CKD, acute illness, blood draws, B12 deficiency   Generalized weakness Continue PT/OT recommending SNF.  Hyperlipidemia Continue statin.  HTN: stable, continue Imdur, Hydralazine  Prophylaxis Subcutaneous heparin.  Code status Full code  Disposition Medically not stable for DC  Called and discussed with Niece: [name] 6/19 ,6/22, 6/24, will call her again later today   LOS: 10 days  [name], MD 10/12/2011, 8:49 AM Pager: [phone]

## 2011-10-12 NOTE — Progress Notes (Signed)
[facility]  Subjective:Awake, lying in bed, talking to niece on phone   Objective: Vital signs in last 24 hours: Blood pressure 145/64, pulse 95, temperature 98.4 F (36.9 C), temperature source Oral, resp. rate 20, height 5\' 10"  (1.778 m), weight 71.2 kg (156 lb 15.5 oz), SpO2 95.00%.  Weight unchanged from admission--71.2 kg  on 16 Jun--71.2 kg today  PHYSICAL EXAM General--awake, weak Chest--crackles in bases Heart--no rub Abd--nontender Extr--edema persists  Lab Results:   Lab 10/12/11 0605 10/11/11 0635 10/10/11 0615  NA 119* 122* 126*  K 4.0 4.2 3.5  CL 83* 86* 88*  CO2 21 22 23   BUN 86* 81* 70*  CREATININE 2.42* 2.47* 2.28*  ALB -- -- --  GLUCOSE 157* -- --  CALCIUM 8.3* 8.5 8.6  PHOS -- -- --     Basename 10/12/11 0605 10/11/11 0833  WBC 8.4 9.0  HGB 8.8* 8.4*  HCT 23.9* 22.7*  PLT 256 242      Assessment/Plan: 1. Hyponatremia:  Does not appear dry to me.  Weight unchanged from 9 days ago.  I still would continue to give furosemide (will try IV today).  Cr a little lower today 2. Hypokalemia:KCl  40 q day-K 4 today .  3. Chronic kidney disease: Also seemingly worsening vs just unmasking of CKD with diuresis. Increase diuretics, off ARB. Renal : R 11.9, L 12 cm, no hydro.  SPEP and UPEP pending 4. Anemia: Likely secondary to hospitalization/frequent blood draws/chronic kidney disease. No intervention is needed at this time. FeTIBC 25% sat, ferritin 622. Will begin aranesp.  Hgb 8.8 today 5. Altered MS- not sure if only due to hyponatremia or acute changes due to PNA. Consider discussion of goals of care with what appears to be FTT.  6. Disposition: continue to await medical stability prior to DC to SNF  7. HTN- Change  Hydralazine to BID     LOS: 10 days   Shaneque Merkle F 10/12/2011,1:41 PM   .labalb

## 2011-10-12 NOTE — Progress Notes (Signed)
Physical Therapy Treatment Patient Details Name: Eric [name] MRN: [id number] DOB: June 08, 1923 Today's Date: 10/12/2011 Time: [id number] PT Time Calculation (min): 28 min  PT Assessment / Plan / Recommendation Comments on Treatment Session  Pt again more lethargic with decr SaO2 at rest and further decr with activity. RN and NT made aware. No further activity due to decr tolerance.    Follow Up Recommendations  Skilled nursing facility    Barriers to Discharge        Equipment Recommendations  Defer to next venue    Recommendations for Other Services    Frequency Min 3X/week   Plan Discharge plan remains appropriate;Frequency remains appropriate    Precautions / Restrictions Precautions Precautions: Fall   Pertinent Vitals/Pain Pt on 3L O2 on arrival, sleeping with SaO2 90%; upon sitting SaO2 decr to 85% on 3L with slow recovery to 90%; with transfer to chair on 3L decr to 82% requiring ~8 minutes to incr to 90%. +dyspnea, RN made aware and in to assess    Mobility  Bed Mobility Bed Mobility: Supine to Sit Rolling Left: 5: Supervision;With rail Left Sidelying to Sit: 5: Supervision;With rails;HOB elevated Sitting - Scoot to Edge of Bed: 5: Supervision Details for Bed Mobility Assistance: Cues to reach across towards Lt and then pt completed with no further cues.Uses momentum to swing legs over EOB and intiate coming to sitting Transfers Transfers: Sit to Stand;Stand to Sit Sit to Stand: 4: Min guard;With upper extremity assist;From bed Stand to Sit: 4: Min assist;With upper extremity assist;With armrests;To chair/3-in-1 Details for Transfer Assistance: Cues for safe use of RW; pt required incr time/effort, but able to lift himself to his feet with minguard assist; pt fatigued as turning to sit and did not fully turn, nearly sitting on armrest, requiring assist to redirect and control descent Ambulation/Gait Ambulation/Gait Assistance: Not tested (comment) (SaO2 down to 82%  with transfer requiring ~8 min to reach 90%)    Exercises     PT Diagnosis:    PT Problem List:   PT Treatment Interventions:     PT Goals Acute Rehab PT Goals PT Goal Formulation: With patient Time For Goal Achievement: 10/17/11 Potential to Achieve Goals: Fair Pt will go Supine/Side to Sit: with modified independence PT Goal: Supine/Side to Sit - Progress: Progressing toward goal Pt will Sit at Edge of Bed: Independently;6-10 min;with no upper extremity support PT Goal: Sit at Edge Of Bed - Progress: Progressing toward goal Pt will go Sit to Stand: Independently;with upper extremity assist PT Goal: Sit to Stand - Progress: Progressing toward goal Pt will go Stand to Sit: Independently PT Goal: Stand to Sit - Progress: Progressing toward goal Pt will Transfer Bed to Chair/Chair to Bed: Independently PT Transfer Goal: Bed to Chair/Chair to Bed - Progress: Progressing toward goal  Visit Information  Last PT Received On: 10/12/11 Assistance Needed: +1    Subjective Data  Subjective: Reports he doesn't feel well...feels weak Patient Stated Goal: Increase his strength.    Cognition  Overall Cognitive Status: Impaired Area of Impairment: Other (comment);Attention (orientation) Arousal/Alertness: Lethargic Orientation Level: Disoriented to;Place;Time Behavior During Session: Lethargic Current Attention Level: Sustained Attention - Other Comments: also limited by lethargy making it hard to stay on task    Balance  Static Sitting Balance Static Sitting - Balance Support: Bilateral upper extremity supported;Feet supported Static Sitting - Level of Assistance: 5: Stand by assistance (for safety) Static Sitting - Comment/# of Minutes: 5 minutes while monitoring O2, working on pursed  lip breathing  End of Session PT - End of Session Equipment Utilized During Treatment: Gait belt;Oxygen (3L O2) Activity Tolerance: Treatment limited secondary to medical complications  (Comment) Patient left: in chair;with call bell/phone within reach Nurse Communication: Mobility status;Other (comment) (decr SaO2 with activity with prolonged recovery to 90%)    Djon Tith 10/12/2011, 2:45 PM Pager [phone]

## 2011-10-13 DIAGNOSIS — E1065 Type 1 diabetes mellitus with hyperglycemia: Secondary | ICD-10-CM

## 2011-10-13 DIAGNOSIS — E871 Hypo-osmolality and hyponatremia: Secondary | ICD-10-CM

## 2011-10-13 DIAGNOSIS — D696 Thrombocytopenia, unspecified: Secondary | ICD-10-CM

## 2011-10-13 DIAGNOSIS — R609 Edema, unspecified: Secondary | ICD-10-CM

## 2011-10-13 LAB — GLUCOSE, CAPILLARY
Glucose-Capillary: 221 mg/dL — ABNORMAL HIGH (ref 70–99)
Glucose-Capillary: 93 mg/dL (ref 70–99)
Glucose-Capillary: 163 mg/dL — ABNORMAL HIGH (ref 70–99)

## 2011-10-13 LAB — RENAL FUNCTION PANEL
CO2: 21 mEq/L (ref 19–32)
Calcium: 8.8 mg/dL (ref 8.4–10.5)
Creatinine, Ser: 2.58 mg/dL — ABNORMAL HIGH (ref 0.50–1.35)
GFR calc non Af Amer: 21 mL/min — ABNORMAL LOW (ref >90)
Phosphorus: 5.7 mg/dL — ABNORMAL HIGH (ref 2.3–4.6)

## 2011-10-13 LAB — UIFE/LIGHT CHAINS/TP QN, 24-HR UR
Albumin, U: DETECTED
Free Kappa Lt Chains,Ur: 4.36 mg/dL — ABNORMAL HIGH (ref 0.14–2.42)
Free Kappa/Lambda Ratio: 0.09 ratio — ABNORMAL LOW (ref 2.04–10.37)
Free Lambda Lt Chains,Ur: 47.2 mg/dL — ABNORMAL HIGH (ref 0.02–0.67)
Gamma Globulin, Urine: DETECTED — AB
Total Protein, Urine: 58.8 mg/dL

## 2011-10-13 MED ORDER — CAPSAICIN 0.025 % EX CREA
TOPICAL_CREAM | Freq: Two times a day (BID) | CUTANEOUS | Status: DC
  Administered 2011-10-13 – 2011-10-28 (×30): via TOPICAL
  Filled 2011-10-13: qty 56.6

## 2011-10-13 NOTE — Progress Notes (Signed)
Brief Summary:Eric [name] is an 78 y.o. male with history of hypertension, diabetes, chronic renal insufficiency, hypertension, elevated PSA, lived alone and was actively working, presented to the urgent care clinic complaining of bilateral pedal edema along with been feeling weak.  Evaluation showed hyponatremia with serum sodium of 122, creatinine of 1.5 He was felt to have hyponatremia induced by SIADH and hypervolemia, His volume status is being managed by Renal for last 8 days, he was aggressively diuresed with large doses IV lasix for several days, then demeclocycline was added to his regimen, He was also noted to have pneumonia on LLL consolidation on CXR and CT abd, hence started with Levaquin, has improved from this standpoint.  Subjective: Complaints of bilateral knee pains and generalized weakness  Objective: Vital signs in last 24 hours: Filed Vitals:   10/13/11 0926 10/13/11 1100 10/13/11 1510 10/13/11 1701  BP:  154/64 169/69 151/61  Pulse:  100 105 99  Temp:  98.3 F (36.8 C) 98.6 F (37 C)   TempSrc:  Oral Oral   Resp:  18 18 18   Height:      Weight:      SpO2: 94% 96% 93% 94%   Patient Vitals for the past 24 hrs:  BP Temp Temp src Pulse Resp SpO2 Weight  10/13/11 1701 151/61 mmHg - - 99  18  94 % -  10/13/11 1510 169/69 mmHg 98.6 F (37 C) Oral 105  18  93 % -  10/13/11 1100 154/64 mmHg 98.3 F (36.8 C) Oral 100  18  96 % -  10/13/11 0926 - - - - - 94 % -  10/13/11 0835 168/62 mmHg - - - - - -  10/13/11 0613 153/65 mmHg 98.5 F (36.9 C) Oral 102  20  92 % 71.215 kg (157 lb)  10/12/11 2126 142/58 mmHg 98 F (36.7 C) Oral 98  20  95 % -    Weight change:   Intake/Output Summary (Last 24 hours) at 10/13/11 1932 Last data filed at 10/13/11 1840  Gross per 24 hour  Intake   1563 ml  Output    950 ml  Net    613 ml    Physical Exam: General:a little lethargic, answers questions appropriately aroused, oriented x2, no acute distress. HEENT: EOMI. Neck:  Supple Heart regular rate and rhythm Lungs: crackles at L base  Abdomen: Soft, Nontender, Nondistended, +bowel sounds.   Lab Results: Basic Metabolic Panel:  Lab [id number] 0615 10/12/11 [id number] 10/11/11 0635 10/10/11 0615 10/09/11 0730  NA 121* 119* 122* 126* 125*  K 4.0 4.0 4.2 3.5 2.9*  CL 83* 83* 86* 88* 87*  CO2 21 21 22 23 24   GLUCOSE 89 157* 116* 110* 148*  BUN 93* 86* 81* 70* 55*  CREATININE 2.58* 2.42* 2.47* 2.28* 1.99*  CALCIUM 8.8 8.3* 8.5 8.6 8.2*  MG -- -- -- -- --  PHOS 5.7* -- -- -- --   Liver Function Tests:  Lab 10/13/11 0615  AST --  ALT --  ALKPHOS --  BILITOT --  PROT --  ALBUMIN 1.4*   No results found for this basename: LIPASE:5,AMYLASE:5 in the last 168 hours No results found for this basename: AMMONIA:5 in the last 168 hours CBC:  Lab 10/12/11 0605 10/11/11 0833 10/10/11 0615 10/09/11 0730 10/08/11 0525  WBC 8.4 9.0 9.2 8.5 11.3*  NEUTROABS -- -- -- -- --  HGB 8.8* 8.4* 9.1* 8.1* 8.8*  HCT 23.9* 22.7* 24.8* 22.2* 23.9*  MCV 86.0  84.1 85.5 84.1 85.7  PLT 256 242 245 266 288   Cardiac Enzymes: No results found for this basename: CKTOTAL:5,CKMB:5,CKMBINDEX:5,TROPONINI:5 in the last 168 hours BNP (last 3 results)  Basename 10/02/11 1938 10/01/11 1036 03/30/11 1055  PROBNP 396.1 1962.0* 27.0   CBG:  Lab 10/13/11 1629 10/13/11 1100 10/13/11 0624 10/12/11 2121 10/12/11 1628  GLUCAP 174* 163* 93 221* 149*   No results found for this basename: HGBA1C:5 in the last 72 hours Other Labs: No components found with this basename: POCBNP:3 No results found for this basename: DDIMER:2 in the last 168 hours No results found for this basename: CHOL:2,HDL:2,LDLCALC:2,TRIG:2,CHOLHDL:2,LDLDIRECT:2 in the last 168 hours No results found for this basename: TSH,T4TOTAL,FREET3,T3FREE,FREET4,THYROIDAB in the last 168 hours No results found for this basename: VITAMINB12:2,FOLATE:2,FERRITIN:2,TIBC:2,IRON:2,RETICCTPCT:2 in the last 168 hours  Micro Results: No  results found for this or any previous visit (from the past 240 hour(s)).  Studies/Results: No results found. CBG (last 3)   Basename 10/13/11 1629 10/13/11 1100 10/13/11 0624  GLUCAP 174* 163* 93     Medications: I have reviewed the patient's current medications. Scheduled Meds:    . aspirin EC  81 mg Oral Daily  . atorvastatin  20 mg Oral Daily  . calcitRIOL  0.25 mcg Oral Daily  . capsaicin   Topical BID  . cyanocobalamin  1,000 mcg Intramuscular Q30 days  . darbepoetin (ARANESP) injection - NON-DIALYSIS  100 mcg Subcutaneous Q Mon-1800  . demeclocycline  150 mg Oral Q12H  . furosemide  100 mg Intravenous Q6H  . heparin  5,000 Units Subcutaneous Q8H  . hydrALAZINE  50 mg Oral BID  . insulin aspart  0-9 Units Subcutaneous TID WC  . insulin aspart protamine-insulin aspart  20 Units Subcutaneous BID WC  . isosorbide mononitrate  10 mg Oral BID WC  . levalbuterol  0.63 mg Nebulization BID  . levofloxacin (LEVAQUIN) IV  750 mg Intravenous Q48H  . pantoprazole  40 mg Oral Q1200  . potassium chloride  40 mEq Oral Daily  . prednisoLONE acetate  1 drop Both Eyes Daily  . senna-docusate  1 tablet Oral BID  . sodium chloride  3 mL Intravenous Q12H   Continuous Infusions:  PRN Meds:.acetaminophen, lactulose, levalbuterol, polyethylene glycol, traMADol  Assessment/Plan: Bilateral lower extremity edema likely due to decompensated acute on chronic diastolic heart failure and CKD 2-D echo 01/13 ejection fraction 0000000, Grade 1 diastolic dysfunction, no valvular abnormalities Renal managing volume status/diuretics  Metabolic encephalopathy: suspect from Pneumonia, and hyponatremia, I wonder if Uremia is contributing now, BUN up from 24 on admission to 93 now  Pneumonia/ Left lower lobe consolidation with effusion on CXR and CT abd pelvis: started IV levaquin 6/19, repeat CXR with increasing L lung consolidation, continue xopenex nebs Clinically improving, afebrile now, WBC  normalized, oxygen requirements improved as well DC Levaquin on 6/28 to complete 10d course  Hyponatremia: worsening Multifactorial, Initially volume overload, SIADH Was aggressively diuresed from admission On Diuretics and Demeclocycline per Renal  ARF on CKD2: Renal USG with normal parenchyma and no Hydronephrosis, creatinine was 1.2 on admission SPEP/UPEP pending, ECHO as noted above Suspect secondary to diuresis /ARB, ARB stopped , continue to monitor   DM (diabetes mellitus), type 1, uncontrolled w/ophthalmic complication NovoLog 0000000, cut down to 20 units subcutaneous twice daily.  Continue SSI.   Anemia  Maybe due to CKD, acute illness, blood draws, B12 deficiency   Generalized weakness Continue PT/OT recommending SNF.  Hyperlipidemia Continue statin.  HTN: stable, continue Imdur, Hydralazine  Prophylaxis Subcutaneous heparin.  Code status Full code  Disposition Medically not stable for DC   LOS: 11 days  Kharma Sampsel, MD 10/13/2011, 7:32 PM Pager: [phone]

## 2011-10-13 NOTE — Progress Notes (Signed)
CSW is continuing to follow and will assist with all d/c needs when patient is medically stable. CSW sent updated clinicals to [facility].   Rhea Pink, MSW, [place]

## 2011-10-13 NOTE — Progress Notes (Signed)
[facility]  Subjective:  Sleepy, opens eyes to questions   Objective: Vital signs in last 24 hours: Blood pressure 154/64, pulse 100, temperature 98.3 F (36.8 C), temperature source Oral, resp. rate 18, height 5\' 10"  (1.778 m), weight 71.215 kg (157 lb), SpO2 96.00%.    PHYSICAL EXAM General--lying in bed Chest--crackles in bases Heart--no rub Abd--nontender Extr--1+ edema  I/O yesterday 835/800 Weight 70.1 kg on 20 Jun--71.2 kg today  Lab Results:   Lab 10/13/11 0615 10/12/11 0605 10/11/11 0635  NA 121* 119* 122*  K 4.0 4.0 4.2  CL 83* 83* 86*  CO2 21 21 22   BUN 93* 86* 81*  CREATININE 2.58* 2.42* 2.47*  ALB -- -- --  GLUCOSE 89 -- --  CALCIUM 8.8 8.3* 8.5  PHOS 5.7* -- --     Basename 10/12/11 0605 10/11/11 0833  WBC 8.4 9.0  HGB 8.8* 8.4*  HCT 23.9* 22.7*  PLT 256 242   Assessment/Plan: 1. Hyponatremia: Does not appear dry to me. Weight unchanged from 9 days ago. I still would continue to give furosemide (will try IV again today). Cr a little higher today  2. Hypokalemia:KCl 40 q day-K 4 today . Also on demeclocycline 3. Chronic kidney disease: Also seemingly worsening vs just unmasking of CKD with diuresis. Increase diuretics, off ARB.  Ultrasound : R 11.9, L 12 cm, no hydro. SPEP and UPEP pending  4. Anemia: Likely secondary to hospitalization/frequent blood draws/chronic kidney disease. No intervention is needed at this time. FeTIBC 25% sat, ferritin 622. Will begin aranesp. Hgb 8.8 yesterday 5. Altered MS- not sure if only due to hyponatremia or acute changes due to PNA. Consider discussion of goals of care with what appears to be FTT.  6. Disposition: continue to await medical stability prior to DC to SNF  7. HTN- Change Hydralazine to BID.  BP 154/64   LOS: 11 days   Patsy Zaragoza F 10/13/2011,12:59 PM   .labalb

## 2011-10-14 DIAGNOSIS — D696 Thrombocytopenia, unspecified: Secondary | ICD-10-CM

## 2011-10-14 DIAGNOSIS — E871 Hypo-osmolality and hyponatremia: Secondary | ICD-10-CM

## 2011-10-14 DIAGNOSIS — E1065 Type 1 diabetes mellitus with hyperglycemia: Secondary | ICD-10-CM

## 2011-10-14 DIAGNOSIS — R609 Edema, unspecified: Secondary | ICD-10-CM

## 2011-10-14 DIAGNOSIS — IMO0002 Reserved for concepts with insufficient information to code with codable children: Secondary | ICD-10-CM

## 2011-10-14 LAB — CBC
MCH: 30.6 pg (ref 26.0–34.0)
MCHC: 36.2 g/dL — ABNORMAL HIGH (ref 30.0–36.0)
Platelets: 248 10*3/uL (ref 150–400)
RDW: 13.7 % (ref 11.5–15.5)

## 2011-10-14 LAB — PROTEIN ELECTROPHORESIS, SERUM
Beta 2: 34 % — ABNORMAL HIGH (ref 3.2–6.5)
Beta Globulin: 3.5 % — ABNORMAL LOW (ref 4.7–7.2)
Gamma Globulin: 5.3 % — ABNORMAL LOW (ref 11.1–18.8)
M-Spike, %: 1.47 g/dL

## 2011-10-14 LAB — RENAL FUNCTION PANEL
Albumin: 1.4 g/dL — ABNORMAL LOW (ref 3.5–5.2)
Calcium: 8.7 mg/dL (ref 8.4–10.5)
GFR calc Af Amer: 25 mL/min — ABNORMAL LOW (ref >90)
Phosphorus: 6.3 mg/dL — ABNORMAL HIGH (ref 2.3–4.6)
Potassium: 3.3 mEq/L — ABNORMAL LOW (ref 3.5–5.1)
Sodium: 122 mEq/L — ABNORMAL LOW (ref 135–145)

## 2011-10-14 LAB — GLUCOSE, CAPILLARY
Glucose-Capillary: 69 mg/dL — ABNORMAL LOW (ref 70–99)
Glucose-Capillary: 144 mg/dL — ABNORMAL HIGH (ref 70–99)
Glucose-Capillary: 345 mg/dL — ABNORMAL HIGH (ref 70–99)
Glucose-Capillary: 329 mg/dL — ABNORMAL HIGH (ref 70–99)

## 2011-10-14 NOTE — Progress Notes (Addendum)
Occupational Therapy Treatment Patient Details Name: SWARIT FRUEH MRN: [id number] DOB: [birth year] Today's Date: 10/14/2011 Time: [id number] OT Time Calculation (min): 26 min  OT Assessment / Plan / Recommendation Comments on Treatment Session Pt. with decreased activity tolerance and SOB with ADL tasks EOB and with transfer to chair.     Follow Up Recommendations  Skilled nursing facility    Barriers to Discharge       Equipment Recommendations  Defer to next venue    Recommendations for Other Services    Frequency Min 2X/week   Plan Discharge plan remains appropriate    Precautions / Restrictions Precautions Precautions: Fall Restrictions Weight Bearing Restrictions: No   Pertinent Vitals/Pain Pt with pain in L leg today.  02 sats during treatment dropped to 88% on 3L.    ADL  Eating/Feeding: Performed;Moderate assistance Where Assessed - Eating/Feeding: Chair Toilet Transfer: Moderate assistance;Performed Toilet Transfer Method: Stand pivot Toileting - Clothing Manipulation and Hygiene: Performed;+2 Total assistance Toileting - Clothing Manipulation and Hygiene: Patient Percentage: 0% Where Assessed - Toileting Clothing Manipulation and Hygiene: Sit to stand from 3-in-1 or toilet Transfers/Ambulation Related to ADLs: MOd assist from bed to chair. Pt very lethargic. ADL Comments: Pt with decreased participation today. Pt had difficulty keeping eyes open, had bowel movement mid transfer and was very fatigued.    OT Diagnosis:    OT Problem List:   OT Treatment Interventions:     OT Goals Acute Rehab OT Goals OT Goal Formulation: With patient Time For Goal Achievement: 10/28/11 Potential to Achieve Goals: Fair ADL Goals Pt Will Perform Grooming: with set-up;with supervision;Standing at sink ADL Goal: Grooming - Progress: Goal set today Pt Will Perform Upper Body Dressing: with set-up;with supervision;Standing ADL Goal: Upper Body Dressing - Progress: Goal set  today Pt Will Perform Lower Body Dressing: with set-up;with supervision;Sit to stand from chair;with adaptive equipment ADL Goal: Lower Body Dressing - Progress: Goal set today Pt Will Transfer to Toilet: with supervision;with DME;3-in-1;Ambulation ADL Goal: Toilet Transfer - Progress: Goal set today Additional ADL Goal #1: Pt. will recall 3 energy conservation techniques with ADLs ADL Goal: Additional Goal #1 - Progress: Goal set today  Visit Information  Last OT Received On: 10/14/11 Assistance Needed: +1    Subjective Data      Prior Functioning       Cognition  Overall Cognitive Status: Impaired Area of Impairment: Attention;Memory;Awareness of errors;Awareness of deficits;Problem solving;Executive functioning Arousal/Alertness: Lethargic Orientation Level: Disoriented to;Place;Time Behavior During Session: Lethargic Current Attention Level: Focused Attention - Other Comments: also limited by lethargy making it hard to stay on task Memory: Decreased recall of precautions Memory Deficits: Pt with poor recall of home set up. Awareness of Errors: Assistance required to identify errors made;Assistance required to correct errors made Awareness of Deficits: Pt so lethergic it was difficult to fully assess awareness. Problem Solving: Poor Executive Functioning: poor Cognition - Other Comments: Pt w obvious decline from evaluation.  Pt very lethargic and confused. Pt only following one step commands.    Mobility Bed Mobility Bed Mobility: Supine to Sit Supine to Sit: 3: Mod assist;HOB flat;With rails Sitting - Scoot to Edge of Bed: 3: Mod assist Details for Bed Mobility Assistance: Cues to reach across towards Lt and then pt completed with no further cues.Uses momentum to swing legs over EOB and intiate coming to sitting Transfers Transfers: Sit to Stand;Stand to Sit Sit to Stand: 3: Mod assist;From elevated surface;From bed;With armrests;With upper extremity assist Stand to  Sit: 3:  Mod assist;With armrests;With upper extremity assist;To chair/3-in-1 Details for Transfer Assistance: Pt became fatigued when standing and then had bowel movement that he was unaware of.   Exercises    Balance Static Sitting Balance Static Sitting - Balance Support: Left upper extremity supported;Right upper extremity supported;Feet supported;Bilateral upper extremity supported Static Sitting - Level of Assistance: 4: Min assist Static Sitting - Comment/# of Minutes: 2  End of Session OT - End of Session Activity Tolerance: Patient limited by fatigue Patient left: in chair;with call bell/phone within reach;with nursing in room Nurse Communication: Mobility status  GO     [name]  [id number]  10/14/2011, 1:18 PM

## 2011-10-14 NOTE — Progress Notes (Signed)
[facility]  Subjective:  Awake, son Doren Custard at bedside.  Mr. Gamarra tells me he has nephew on dialysis in [place].  No orthopnea, but still quit weak.  He lived by himself prior to admission   Objective: Vital signs in last 24 hours: Blood pressure 165/65, pulse 99, temperature 97.9 F (36.6 C), temperature source Oral, resp. rate 18, height 5\' 10"  (1.778 m), weight 70.126 kg (154 lb 9.6 oz), SpO2 93.00%.    PHYSICAL EXAM General--awake, courteous, weak Chest--crackles L base Heart--tachy, no rub Abd--nontender Extr--3+ pretib edema  Date  Weight 20 Jun   70.1 kg 26 Jun  71.2 kg 27 Jun  70.1 kg  Lab Results:   Lab 10/14/11 0500 10/13/11 0615 10/12/11 0605  NA 122* 121* 119*  K 3.3* 4.0 4.0  CL 84* 83* 83*  CO2 23 21 21   BUN 91* 93* 86*  CREATININE 2.50* 2.58* 2.42*  ALB -- -- --  GLUCOSE 119* -- --  CALCIUM 8.7 8.8 8.3*  PHOS 6.3* 5.7* --     Basename 10/14/11 0500 10/12/11 0605  WBC 10.0 8.4  HGB 9.3* 8.8*  HCT 25.7* 23.9*  PLT 248 256   Assessment/Plan 1. Hyponatremia: Does not appear dry to me. Weight down a little with IV furosemide. I still would continue to give furosemide (will try IV again today). Cr a little lower today  2. Hypokalemia:KCl 40 q day-K 3.3 today .Increase KCl to 40 BID.   Also on demeclocycline  3. Chronic kidney disease: Also seemingly worsening vs just unmasking of CKD with diuresis. Increase diuretics, off ARB. Ultrasound : R 11.9, L 12 cm, no hydro. SPEP and UPEP pending  4. Anemia: Likely secondary to hospitalization/frequent blood draws/chronic kidney disease. No intervention is needed at this time. FeTIBC 25% sat, ferritin 622. Will begin aranesp. Hgb 9.3 yesterday  5. Altered MS- not sure if only due to hyponatremia or acute changes due to PNA.  He said he would go on dialysis if necessary.  I discussed this with him and his on.  6. Disposition: continue to await medical stability prior to DC to SNF or transfer  to rehab  7. HTN-  Hydralazine BID. BP 165/65. I'd suggest clonidine or other agent which would not cause tachycardia (carvedilol) since his pulse is 95-100    LOS: 12 days   Waldon Sheerin F 10/14/2011,12:04 PM   .labalb

## 2011-10-14 NOTE — Progress Notes (Signed)
Brief Summary:Eric [name] is an 78 y.o. male with history of hypertension, diabetes, chronic renal insufficiency, hypertension, elevated PSA, lived alone and was actively working, presented to the urgent care clinic complaining of bilateral pedal edema along with been feeling weak.  Evaluation showed hyponatremia with serum sodium of 122, creatinine of 1.5 He was felt to have hyponatremia induced by SIADH and hypervolemia, His volume status is being managed by Renal for last 8 days, he was aggressively diuresed with large doses IV lasix for several days, then demeclocycline was added to his regimen, He was also noted to have pneumonia on LLL consolidation on CXR and CT abd, hence started with Levaquin, has improved from this standpoint.  Subjective: Falls asleep while eating.   Objective: Vital signs in last 24 hours: Filed Vitals:   10/13/11 2152 10/14/11 0441 10/14/11 0557 10/14/11 1004  BP: 151/51 165/65    Pulse: 104 99    Temp: 97.5 F (36.4 C) 97.9 F (36.6 C)    TempSrc: Oral Oral    Resp: 20 18    Height:      Weight:   70.126 kg (154 lb 9.6 oz)   SpO2: 97% 95%  93%   Patient Vitals for the past 24 hrs:  BP Temp Temp src Pulse Resp SpO2 Weight  10/14/11 1004 - - - - - 93 % -  10/14/11 0557 - - - - - - 70.126 kg (154 lb 9.6 oz)  10/14/11 0441 165/65 mmHg 97.9 F (36.6 C) Oral 99  18  95 % -  10/13/11 2152 151/51 mmHg 97.5 F (36.4 C) Oral 104  20  97 % -  10/13/11 2100 - - - - - 92 % -  10/13/11 1701 151/61 mmHg - - 99  18  94 % -  10/13/11 1510 169/69 mmHg 98.6 F (37 C) Oral 105  18  93 % -    Weight change: -1.089 kg (-2 lb 6.4 oz)  Intake/Output Summary (Last 24 hours) at 10/14/11 1224 Last data filed at 10/14/11 1018  Gross per 24 hour  Intake   1555 ml  Output   3700 ml  Net  -2145 ml    Physical Exam: General:answers questions appropriately once aroused, oriented x2, no acute distress. diminished muscle mass  Heart regular rate and rhythm Lungs:  crackles at L base  Abdomen: Soft, Nontender, Nondistended, +bowel sounds.   Lab Results: Basic Metabolic Panel:  Lab [id number] 0500 10/13/11 0615 10/12/11 0605 10/11/11 0635 10/10/11 0615  NA 122* 121* 119* 122* 126*  K 3.3* 4.0 4.0 4.2 3.5  CL 84* 83* 83* 86* 88*  CO2 23 21 21 22 23   GLUCOSE 119* 89 157* 116* 110*  BUN 91* 93* 86* 81* 70*  CREATININE 2.50* 2.58* 2.42* 2.47* 2.28*  CALCIUM 8.7 8.8 8.3* 8.5 8.6  MG -- -- -- -- --  PHOS 6.3* 5.7* -- -- --   Liver Function Tests:  Lab 10/14/11 0500 10/13/11 0615  AST -- --  ALT -- --  ALKPHOS -- --  BILITOT -- --  PROT -- --  ALBUMIN 1.4* 1.4*   No results found for this basename: LIPASE:5,AMYLASE:5 in the last 168 hours No results found for this basename: AMMONIA:5 in the last 168 hours CBC:  Lab 10/14/11 0500 10/12/11 0605 10/11/11 0833 10/10/11 0615 10/09/11 0730  WBC 10.0 8.4 9.0 9.2 8.5  NEUTROABS -- -- -- -- --  HGB 9.3* 8.8* 8.4* 9.1* 8.1*  HCT 25.7* 23.9* 22.7*  24.8* 22.2*  MCV 84.5 86.0 84.1 85.5 84.1  PLT 248 256 242 245 266   Cardiac Enzymes: No results found for this basename: CKTOTAL:5,CKMB:5,CKMBINDEX:5,TROPONINI:5 in the last 168 hours BNP (last 3 results)  Basename 10/02/11 1938 10/01/11 1036 03/30/11 1055  PROBNP 396.1 1962.0* 27.0   CBG:  Lab 10/14/11 1100 10/14/11 0554 10/13/11 2222 10/13/11 2142 10/13/11 1629  GLUCAP 204* 144* 104* 69* 174*   No results found for this basename: HGBA1C:5 in the last 72 hours Other Labs: No components found with this basename: POCBNP:3 No results found for this basename: DDIMER:2 in the last 168 hours No results found for this basename: CHOL:2,HDL:2,LDLCALC:2,TRIG:2,CHOLHDL:2,LDLDIRECT:2 in the last 168 hours No results found for this basename: TSH,T4TOTAL,FREET3,T3FREE,FREET4,THYROIDAB in the last 168 hours No results found for this basename: VITAMINB12:2,FOLATE:2,FERRITIN:2,TIBC:2,IRON:2,RETICCTPCT:2 in the last 168 hours  Micro Results: No results found  for this or any previous visit (from the past 240 hour(s)).  Studies/Results: No results found. CBG (last 3)   Basename 10/14/11 1100 10/14/11 0554 10/13/11 2222  GLUCAP 204* 144* 104*     Medications: I have reviewed the patient's current medications. Scheduled Meds:    . aspirin EC  81 mg Oral Daily  . atorvastatin  20 mg Oral Daily  . calcitRIOL  0.25 mcg Oral Daily  . capsaicin   Topical BID  . cyanocobalamin  1,000 mcg Intramuscular Q30 days  . darbepoetin (ARANESP) injection - NON-DIALYSIS  100 mcg Subcutaneous Q Mon-1800  . demeclocycline  150 mg Oral Q12H  . furosemide  100 mg Intravenous Q6H  . heparin  5,000 Units Subcutaneous Q8H  . hydrALAZINE  50 mg Oral BID  . insulin aspart  0-9 Units Subcutaneous TID WC  . insulin aspart protamine-insulin aspart  20 Units Subcutaneous BID WC  . isosorbide mononitrate  10 mg Oral BID WC  . levalbuterol  0.63 mg Nebulization BID  . levofloxacin (LEVAQUIN) IV  750 mg Intravenous Q48H  . pantoprazole  40 mg Oral Q1200  . potassium chloride  40 mEq Oral Daily  . prednisoLONE acetate  1 drop Both Eyes Daily  . senna-docusate  1 tablet Oral BID  . sodium chloride  3 mL Intravenous Q12H   Continuous Infusions:  PRN Meds:.acetaminophen, lactulose, levalbuterol, polyethylene glycol, traMADol  Assessment/Plan: Bilateral lower extremity edema likely due to decompensated acute on chronic diastolic heart failure and CKD 2-D echo 01/13 ejection fraction 0000000, Grade 1 diastolic dysfunction, no valvular abnormalities Renal managing volume status/diuretics  Metabolic encephalopathy: At the beginning the patient had confusion probably from Pneumonia, and hyponatremia. Now lethargy is probably due to  Uremia   Pneumonia/ Left lower lobe consolidation with effusion on CXR and CT abd pelvis: started IV levaquin 6/19, repeat CXR with increasing L lung consolidation, continue xopenex nebs Clinically improving, afebrile now, WBC normalized,  oxygen requirements improved as well DC Levaquin on 6/28 to complete 10d course  Hyponatremia: worsening Multifactorial, Initially volume overload, SIADH Was aggressively diuresed from admission On Diuretics and Demeclocycline per Renal  ARF on CKD2: Renal USG with normal parenchyma and no Hydronephrosis, creatinine was 1.2 on admission SPEP/UPEP pending, ECHO as noted above Suspect secondary to diuresis /ARB, ARB stopped , continue to monitor   DM (diabetes mellitus), type 1, uncontrolled w/ophthalmic complication NovoLog 0000000, cut down to 20 units subcutaneous twice daily.  Continue SSI.   Anemia  Maybe due to CKD, acute illness, blood draws, B12 deficiency   Generalized weakness Continue PT/OT recommending SNF.  Hyperlipidemia Continue statin.  HTN: stable, continue Imdur, Hydralazine  Prophylaxis Subcutaneous heparin.  Code status Full code  Disposition Medically not stable for DC   LOS: 12 days  Madylin Fairbank, MD 10/14/2011, 12:24 PM Pager: [phone]

## 2011-10-15 DIAGNOSIS — E1065 Type 1 diabetes mellitus with hyperglycemia: Secondary | ICD-10-CM

## 2011-10-15 DIAGNOSIS — E871 Hypo-osmolality and hyponatremia: Secondary | ICD-10-CM

## 2011-10-15 DIAGNOSIS — R609 Edema, unspecified: Secondary | ICD-10-CM

## 2011-10-15 DIAGNOSIS — D696 Thrombocytopenia, unspecified: Secondary | ICD-10-CM

## 2011-10-15 LAB — CBC
MCH: 31.5 pg (ref 26.0–34.0)
Platelets: 217 10*3/uL (ref 150–400)
RBC: 2.67 MIL/uL — ABNORMAL LOW (ref 4.22–5.81)
RDW: 14 % (ref 11.5–15.5)
WBC: 8.7 10*3/uL (ref 4.0–10.5)

## 2011-10-15 LAB — PROTEIN ELECTROPHORESIS, SERUM
Alpha-1-Globulin: 12.2 % — ABNORMAL HIGH (ref 2.9–4.9)
Beta 2: 35.3 % — ABNORMAL HIGH (ref 3.2–6.5)
Gamma Globulin: 6 % — ABNORMAL LOW (ref 11.1–18.8)

## 2011-10-15 LAB — COMPREHENSIVE METABOLIC PANEL
ALT: 40 U/L (ref 0–53)
AST: 90 U/L — ABNORMAL HIGH (ref 0–37)
Albumin: 1.2 g/dL — ABNORMAL LOW (ref 3.5–5.2)
CO2: 26 mEq/L (ref 19–32)
Calcium: 8.8 mg/dL (ref 8.4–10.5)
Chloride: 88 mEq/L — ABNORMAL LOW (ref 96–112)
Creatinine, Ser: 2.32 mg/dL — ABNORMAL HIGH (ref 0.50–1.35)
GFR calc non Af Amer: 24 mL/min — ABNORMAL LOW (ref >90)
Sodium: 127 mEq/L — ABNORMAL LOW (ref 135–145)

## 2011-10-15 LAB — GLUCOSE, CAPILLARY
Glucose-Capillary: 159 mg/dL — ABNORMAL HIGH (ref 70–99)
Glucose-Capillary: 135 mg/dL — ABNORMAL HIGH (ref 70–99)

## 2011-10-15 MED ORDER — INSULIN ASPART 100 UNIT/ML ~~LOC~~ SOLN
10.0000 [IU] | Freq: Once | SUBCUTANEOUS | Status: AC
  Administered 2011-10-16: 10 [IU] via SUBCUTANEOUS

## 2011-10-15 NOTE — Progress Notes (Signed)
Brief Summary:Eric [name] is an 78 y.o. male with history of hypertension, diabetes, chronic renal insufficiency, hypertension, elevated PSA, lived alone and was actively working, presented to the urgent care clinic complaining of bilateral pedal edema along with been feeling weak.  Evaluation showed hyponatremia with serum sodium of 122, creatinine of 1.5 He was felt to have hyponatremia induced by SIADH and hypervolemia, His volume status is being managed by Renal for last 8 days, he was aggressively diuresed with large doses IV lasix for several days, then demeclocycline was added to his regimen, He was also noted to have pneumonia on LLL consolidation on CXR and CT abd, hence started with Levaquin, has improved from this standpoint.  Subjective: Says the knee pain is not so bad anymore  Objective: Vital signs in last 24 hours: Filed Vitals:   10/14/11 2053 10/15/11 0508 10/15/11 0758 10/15/11 1451  BP: 154/59 157/66  170/66  Pulse: 111 92  104  Temp: 98.1 F (36.7 C) 97.7 F (36.5 C)  98.4 F (36.9 C)  TempSrc: Oral Oral  Oral  Resp: 16 18  18   Height:      Weight:  68.856 kg (151 lb 12.8 oz)    SpO2: 92% 95% 94% 96%   Patient Vitals for the past 24 hrs:  BP Temp Temp src Pulse Resp SpO2 Weight  10/15/11 1451 170/66 mmHg 98.4 F (36.9 C) Oral 104  18  96 % -  10/15/11 0758 - - - - - 94 % -  10/15/11 0508 157/66 mmHg 97.7 F (36.5 C) Oral 92  18  95 % 68.856 kg (151 lb 12.8 oz)  10/14/11 2053 154/59 mmHg 98.1 F (36.7 C) Oral 111  16  92 % -    Weight change: -1.27 kg (-2 lb 12.8 oz)  Intake/Output Summary (Last 24 hours) at 10/15/11 1735 Last data filed at 10/15/11 1616  Gross per 24 hour  Intake    840 ml  Output   1850 ml  Net  -1010 ml    Physical Exam: General:answers questions appropriately once aroused, oriented x2, no acute distress. diminished muscle mass  Heart regular rate and rhythm Lungs: crackles at L base  Abdomen: Soft, Nontender, Nondistended,  +bowel sounds.   Lab Results: Basic Metabolic Panel:  Lab [id number] 0644 10/14/11 0500 10/13/11 0615 10/12/11 0605 10/11/11 0635  NA 127* 122* 121* 119* 122*  K 3.6 3.3* 4.0 4.0 4.2  CL 88* 84* 83* 83* 86*  CO2 26 23 21 21 22   GLUCOSE 152* 119* 89 157* 116*  BUN 91* 91* 93* 86* 81*  CREATININE 2.32* 2.50* 2.58* 2.42* 2.47*  CALCIUM 8.8 8.7 8.8 8.3* 8.5  MG -- -- -- -- --  PHOS 5.3* 6.3* 5.7* -- --   Liver Function Tests:  Lab 10/15/11 0644 10/14/11 0500 10/13/11 0615  AST 90* -- --  ALT 40 -- --  ALKPHOS 170* -- --  BILITOT 0.6 -- --  PROT 5.9* -- --  ALBUMIN 1.2* 1.4* 1.4*   No results found for this basename: LIPASE:5,AMYLASE:5 in the last 168 hours No results found for this basename: AMMONIA:5 in the last 168 hours CBC:  Lab 10/15/11 0644 10/14/11 0500 10/12/11 0605 10/11/11 0833 10/10/11 0615  WBC 8.7 10.0 8.4 9.0 9.2  NEUTROABS -- -- -- -- --  HGB 8.4* 9.3* 8.8* 8.4* 9.1*  HCT 22.8* 25.7* 23.9* 22.7* 24.8*  MCV 85.4 84.5 86.0 84.1 85.5  PLT 217 248 256 242 245   Cardiac  Enzymes: No results found for this basename: CKTOTAL:5,CKMB:5,CKMBINDEX:5,TROPONINI:5 in the last 168 hours BNP (last 3 results)  Basename 10/02/11 1938 10/01/11 1036 03/30/11 1055  PROBNP 396.1 1962.0* 27.0   CBG:  Lab 10/15/11 1628 10/15/11 1109 10/15/11 0548 10/14/11 2100 10/14/11 1600  GLUCAP 256* 135* 159* 329* 345*   No results found for this basename: HGBA1C:5 in the last 72 hours Other Labs: No components found with this basename: POCBNP:3 No results found for this basename: DDIMER:2 in the last 168 hours No results found for this basename: CHOL:2,HDL:2,LDLCALC:2,TRIG:2,CHOLHDL:2,LDLDIRECT:2 in the last 168 hours No results found for this basename: TSH,T4TOTAL,FREET3,T3FREE,FREET4,THYROIDAB in the last 168 hours No results found for this basename: VITAMINB12:2,FOLATE:2,FERRITIN:2,TIBC:2,IRON:2,RETICCTPCT:2 in the last 168 hours  Micro Results: No results found for this or any  previous visit (from the past 240 hour(s)).  Studies/Results: No results found. CBG (last 3)   Basename 10/15/11 1628 10/15/11 1109 10/15/11 0548  GLUCAP 256* 135* 159*     Medications: I have reviewed the patient's current medications. Scheduled Meds:    . aspirin EC  81 mg Oral Daily  . atorvastatin  20 mg Oral Daily  . calcitRIOL  0.25 mcg Oral Daily  . capsaicin   Topical BID  . cyanocobalamin  1,000 mcg Intramuscular Q30 days  . darbepoetin (ARANESP) injection - NON-DIALYSIS  100 mcg Subcutaneous Q Mon-1800  . demeclocycline  150 mg Oral Q12H  . furosemide  100 mg Intravenous Q6H  . heparin  5,000 Units Subcutaneous Q8H  . hydrALAZINE  50 mg Oral BID  . insulin aspart  0-9 Units Subcutaneous TID WC  . insulin aspart protamine-insulin aspart  20 Units Subcutaneous BID WC  . isosorbide mononitrate  10 mg Oral BID WC  . levalbuterol  0.63 mg Nebulization BID  . levofloxacin (LEVAQUIN) IV  750 mg Intravenous Q48H  . pantoprazole  40 mg Oral Q1200  . potassium chloride  40 mEq Oral Daily  . prednisoLONE acetate  1 drop Both Eyes Daily  . senna-docusate  1 tablet Oral BID  . sodium chloride  3 mL Intravenous Q12H   Continuous Infusions:  PRN Meds:.acetaminophen, lactulose, levalbuterol, polyethylene glycol, traMADol  Assessment/Plan: Bilateral lower extremity edema likely due to decompensated acute on chronic diastolic heart failure and CKD 2-D echo 01/13 ejection fraction 0000000, Grade 1 diastolic dysfunction, no valvular abnormalities Renal managing volume status/diuretics  Metabolic encephalopathy: At the beginning the patient had confusion probably from Pneumonia, and hyponatremia. Now lethargy is probably due to  Uremia   Pneumonia/ Left lower lobe consolidation with effusion on CXR and CT abd pelvis: started IV levaquin 6/19, repeat CXR with increasing L lung consolidation, continue xopenex nebs Clinically improving, afebrile now, WBC normalized, oxygen  requirements improved as well Completed a course of 10 days of antibiotic with Levaquin on 6/28   Hyponatremia:  Multifactorial, Initially volume overload, SIADH Was aggressively diuresed from admission On Diuretics and Demeclocycline per Renal  ARF on CKD2: Renal USG with normal parenchyma and no Hydronephrosis, creatinine was 1.2 on admission SPEP/UPEP pending, ECHO as noted above Suspect secondary to diuresis /ARB, ARB stopped , continue to monitor Per nephrology   DM (diabetes mellitus), type 1, uncontrolled w/ophthalmic complication NovoLog 0000000, cut down to 20 units subcutaneous twice daily.  Continue SSI.   Anemia  Maybe due to CKD, acute illness, blood draws, B12 deficiency   Generalized weakness Continue PT/OT recommending SNF.  Hyperlipidemia Continue statin.  HTN: stable, continue Imdur, Hydralazine  Prophylaxis Subcutaneous heparin.  Code status  Full code  Disposition Medically not stable for DC   LOS: 13 days  Quanta Roher, MD 10/15/2011, 5:35 PM Pager: [phone]

## 2011-10-15 NOTE — Progress Notes (Signed)
Eric [name]  Subjective:  Awake, says he feels a "little better."  Objective: Vital signs in last 24 hours: Blood pressure 157/66, pulse 92, temperature 97.7 [name] (36.5 C), temperature source Oral, resp. rate 18, height 5\' 10"  (1.778 m), weight 68.856 kg (151 lb 12.8 oz), SpO2 94.00%.    PHYSICAL EXAM General--awake, sitting up in bed Chest--crackles L base Heart--no rub Abd--nontender Extr--still with marked edema  Date   Weight  20 Jun  70.1 kg  26 Jun   71.2 kg  27 Jun  70.1 kg 28 Jun  68.9 kg  Lab Results:   Lab 10/15/11 0644 10/14/11 0500 10/13/11 0615  NA 127* 122* 121*  K 3.6 3.3* 4.0  CL 88* 84* 83*  CO2 26 23 21   BUN 91* 91* 93*  CREATININE 2.32* 2.50* 2.58*  ALB -- -- --  GLUCOSE 152* -- --  CALCIUM 8.8 8.7 8.8  PHOS 5.3* 6.3* 5.7*     Basename 10/15/11 0644 10/14/11 0500  WBC 8.7 10.0  HGB 8.4* 9.3*  HCT 22.8* 25.7*  PLT 217 248    Assessment/Plan: 1. Hyponatremia: Does not appear dry to me. Weight down a little with IV furosemide. I still would continue to give furosemide. Cr a little lower today. He may be on wrong side of Starling curve and as edema and vol overload improve, then LV function may improve. I wonder if LV function is depressed--requiring this high dose of diuretics--Rec 2D echo to assess LV function  2. Hypokalemia:KCl 40 q day-K 3.6 today .Increase KCl to 40 BID. Also on demeclocycline  3. Chronic kidney disease: Also seemingly worsening vs just unmasking of CKD with diuresis. Increase diuretics, off ARB. Ultrasound : R 11.9, L 12 cm, no hydro. SPEP and UPEP still  pending  4. Anemia: Likely secondary to hospitalization/frequent blood draws/chronic kidney disease. No intervention is needed at this time. FeTIBC 25% sat, ferritin 622. On aranesp 100/wk. Hgb 8.4 today  5. Altered MS- not sure if only due to hyponatremia or acute changes due to PNA. He said he would go on dialysis if necessary. I discussed this with him and  his son.  6. Disposition: continue to await medical stability prior to DC to SNF or transfer to rehab  7. HTN- Hydralazine BID. BP 1155/76. I'd suggest clonidine or other agent which would not cause tachycardia (carvedilol) since his pulse is 95-100     LOS: 13 days   Eric [name] 10/15/2011,11:24 AM   .labalb

## 2011-10-15 NOTE — Progress Notes (Signed)
Physical Therapy Treatment Patient Details Name: Eric [name] MRN: [id number] DOB: 1923-10-15 Today's Date: 10/15/2011 Time: [id number] PT Time Calculation (min): 19 min  PT Assessment / Plan / Recommendation Comments on Treatment Session  Pt again more lethargic with decr SaO2 at rest and further decr with activity. RN and NT made aware. No further activity due to decr tolerance.    Follow Up Recommendations  Skilled nursing facility    Barriers to Discharge        Equipment Recommendations  Defer to next venue    Recommendations for Other Services    Frequency Min 3X/week   Plan Discharge plan remains appropriate;Frequency remains appropriate    Precautions / Restrictions Precautions Precautions: Fall Restrictions Weight Bearing Restrictions: No   Pertinent Vitals/Pain No c/o pain.      Mobility  Bed Mobility Bed Mobility: Supine to Sit Supine to Sit: 3: Mod assist;HOB flat;With rails Sitting - Scoot to Edge of Bed: 3: Mod assist Details for Bed Mobility Assistance: Assist to transition trunk from side to sitting with pt using bedrail secondary to weakness Transfers Transfers: Sit to Stand;Stand to Sit;Stand Pivot Transfers Sit to Stand: 3: Mod assist;With upper extremity assist;From bed Stand to Sit: 3: Mod assist;With upper extremity assist;To chair/3-in-1;With armrests Stand Pivot Transfers: 1: +1 Total assist Details for Transfer Assistance: Continuous step by step cueing.  Pt had difficulty standing with RW. Manual facilitation to initiate pivot. Pt presents with flexed trunk posture posture which increased as pt fatigued.  Max assist to control pt's descent to chair.  Ambulation/Gait Ambulation/Gait Assistance: 1: +1 Total assist Ambulation Distance (Feet): 2 Feet Assistive device: Rolling walker Ambulation/Gait Assistance Details: Pt only able to take 2-3 small steps forward. Pt require verbal and tactile cueing to initate step bilaterally. Gait Pattern:  Step-to pattern;Decreased hip/knee flexion - right;Decreased hip/knee flexion - left;Decreased step length - right;Decreased step length - left;Trunk flexed Wheelchair Mobility Wheelchair Mobility: No    Exercises     PT Diagnosis:    PT Problem List:   PT Treatment Interventions:     PT Goals Acute Rehab PT Goals PT Goal Formulation: With patient Time For Goal Achievement: 10/17/11 Potential to Achieve Goals: Fair Pt will go Supine/Side to Sit: with modified independence PT Goal: Supine/Side to Sit - Progress: Progressing toward goal Pt will Sit at Edge of Bed: Independently;6-10 min;with no upper extremity support PT Goal: Sit at Edge Of Bed - Progress: Progressing toward goal Pt will go Sit to Supine/Side: Independently;with HOB 0 degrees Pt will go Sit to Stand: Independently;with upper extremity assist PT Goal: Sit to Stand - Progress: Not progressing Pt will go Stand to Sit: Independently PT Goal: Stand to Sit - Progress: Not progressing Pt will Transfer Bed to Chair/Chair to Bed: Independently PT Transfer Goal: Bed to Chair/Chair to Bed - Progress: Not progressing Pt will Ambulate: >150 feet;with modified independence;with cane PT Goal: Ambulate - Progress: Not progressing Pt will Go Up / Down Stairs: 6-9 stairs;with modified independence;with rail(s);with cane PT Goal: Up/Down Stairs - Progress: Discontinued (comment)  Visit Information  Last PT Received On: 10/15/11    Subjective Data  Subjective: Pt reports that he feel tired from having many visitors today.   Patient Stated Goal: Increase his strength.    Cognition  Overall Cognitive Status: Impaired Area of Impairment: Attention;Memory;Awareness of errors;Awareness of deficits;Problem solving;Executive functioning Arousal/Alertness: Lethargic Orientation Level: Disoriented to;Place;Time Behavior During Session: Lethargic Current Attention Level: Focused Attention - Other Comments: also limited by lethargy  making it hard to stay on task Memory: Decreased recall of precautions Memory Deficits: Pt with poor recall of home set up. Awareness of Errors: Assistance required to identify errors made;Assistance required to correct errors made    Balance     End of Session PT - End of Session Equipment Utilized During Treatment: Gait belt;Oxygen Activity Tolerance: Treatment limited secondary to medical complications (Comment) Patient left: in chair;with call bell/phone within reach Nurse Communication: Mobility status;Other (comment)   GP     Steele Ledonne 10/15/2011, 4:01 PM Wave Calzada L. Carolena Fairbank DPT [phone]

## 2011-10-16 DIAGNOSIS — E1065 Type 1 diabetes mellitus with hyperglycemia: Secondary | ICD-10-CM

## 2011-10-16 DIAGNOSIS — E871 Hypo-osmolality and hyponatremia: Secondary | ICD-10-CM

## 2011-10-16 DIAGNOSIS — R609 Edema, unspecified: Secondary | ICD-10-CM

## 2011-10-16 DIAGNOSIS — D696 Thrombocytopenia, unspecified: Secondary | ICD-10-CM

## 2011-10-16 LAB — COMPREHENSIVE METABOLIC PANEL
Albumin: 1.2 g/dL — ABNORMAL LOW (ref 3.5–5.2)
Alkaline Phosphatase: 195 U/L — ABNORMAL HIGH (ref 39–117)
BUN: 97 mg/dL — ABNORMAL HIGH (ref 6–23)
CO2: 28 mEq/L (ref 19–32)
Chloride: 89 mEq/L — ABNORMAL LOW (ref 96–112)
Creatinine, Ser: 2.2 mg/dL — ABNORMAL HIGH (ref 0.50–1.35)
GFR calc non Af Amer: 26 mL/min — ABNORMAL LOW (ref >90)
Glucose, Bld: 227 mg/dL — ABNORMAL HIGH (ref 70–99)
Potassium: 4.1 mEq/L (ref 3.5–5.1)
Total Bilirubin: 0.4 mg/dL (ref 0.3–1.2)

## 2011-10-16 LAB — CBC
HCT: 23.4 % — ABNORMAL LOW (ref 39.0–52.0)
Hemoglobin: 8.3 g/dL — ABNORMAL LOW (ref 13.0–17.0)
MCV: 86 fL (ref 78.0–100.0)
RBC: 2.72 MIL/uL — ABNORMAL LOW (ref 4.22–5.81)
RDW: 14.1 % (ref 11.5–15.5)
WBC: 8.8 10*3/uL (ref 4.0–10.5)

## 2011-10-16 LAB — PHOSPHORUS: Phosphorus: 4.3 mg/dL (ref 2.3–4.6)

## 2011-10-16 LAB — GLUCOSE, CAPILLARY

## 2011-10-16 MED ORDER — DEXTROSE 5 % IV SOLN
100.0000 mg | Freq: Four times a day (QID) | INTRAVENOUS | Status: DC
  Administered 2011-10-16 – 2011-10-18 (×10): 100 mg via INTRAVENOUS
  Filled 2011-10-16 (×12): qty 10

## 2011-10-16 NOTE — Progress Notes (Addendum)
[facility]  Subjective:  Sleepy, responds to voice, but not much interaction.  Did not receive any furosemide last 2 days (?)   Objective: Vital signs in last 24 hours: Blood pressure 171/70, pulse 102, temperature 98.2 F (36.8 C), temperature source Oral, resp. rate 18, height 5\' 10"  (1.778 m), weight 69.037 kg (152 lb 3.2 oz), SpO2 100.00%. PHYSICAL EXAM General--as above  Chest--crackles L base Heart--no rub Abd--nontender Extr--still with marked pretib edema     Date   Weight  20 Jun  70.1 kg  26 Jun   71.2 kg  27 Jun  70.1 kg  28 Jun  68.9 kg 29 Jun  69 kg   Lab Results:   Lab 10/16/11 0615 10/15/11 0644 10/14/11 0500  NA 128* 127* 122*  K 4.1 3.6 3.3*  CL 89* 88* 84*  CO2 28 26 23   BUN 97* 91* 91*  CREATININE 2.20* 2.32* 2.50*  ALB -- -- --  GLUCOSE 227* -- --  CALCIUM 9.5 8.8 8.7  PHOS 4.3 5.3* 6.3*     Basename 10/16/11 0615 10/15/11 0644  WBC 8.8 8.7  HGB 8.3* 8.4*  HCT 23.4* 22.8*  PLT 199 217    Assessment/Plan: 1. Hyponatremia: Does not appear dry to me. Weight down a little with IV furosemide. I still would continue to give furosemide (hadn't gotten any last 2 days). Cr a little lower today. He may be on wrong side of Starling curve and as edema and vol overload improve, then LV function may improve. I wonder if LV function is depressed--requiring this high dose of diuretics--Recommend-- 2D echo to assess LV function  2. Hypokalemia:KCl 40 q day-K 4.1 today .Increased KCl to 40 BID yesterday. Also on demeclocycline  3. Chronic kidney disease: Also seemingly worsening vs just unmasking of CKD with diuresis. Increase diuretics, off ARB. Ultrasound : R 11.9, L 12 cm, no hydro. SPEP and UPEP still pending.  On calcitriol for ? Reason (PTH 49.3 in Jan 2013).  He was on this PTA --prescribed by his primary care doc (Dr. Renato Shin)  4. Anemia: Likely secondary to hospitalization/frequent blood draws/chronic kidney disease. FeTIBC 25%  sat, ferritin 622. On aranesp 100/wk. Hgb 8. Today.  Will also check hemocult  With high BUN 5. Altered MS- not sure if only due to hyponatremia or acute changes due to PNA. He said he would go on dialysis if necessary. I discussed this with him and his son.  6. Disposition: continue to await medical stability prior to DC to SNF or transfer to rehab  7. HTN- Hydralazine BID. BP 1155/76. I'd suggest clonidine or other agent which would not cause tachycardia (carvedilol) since his pulse is 95-100    LOS: 14 days   Thurman Sarver F 10/16/2011,11:23 AM   .labalb

## 2011-10-16 NOTE — Progress Notes (Signed)
Brief Summary:Eric [name] is an 78 y.o. male with history of hypertension, diabetes, chronic renal insufficiency, hypertension, elevated PSA, lived alone and was actively working, presented to the urgent care clinic complaining of bilateral pedal edema along with been feeling weak.  Evaluation showed hyponatremia with serum sodium of 122, creatinine of 1.5 He was felt to have hyponatremia induced by SIADH and hypervolemia, His volume status is being managed by Renal for last 8 days, he was aggressively diuresed with large doses IV lasix for several days, then demeclocycline was added to his regimen, He was also noted to have pneumonia on LLL consolidation on CXR and CT abd, hence started with Levaquin, has improved from this standpoint.  Subjective: Patient without any complaints today.  Objective: Vital signs in last 24 hours: Filed Vitals:   10/15/11 1451 10/15/11 2046 10/16/11 0526 10/16/11 1410  BP: 170/66 161/73 171/70 149/62  Pulse: 104 106 102 90  Temp: 98.4 F (36.9 C) 98.9 F (37.2 C) 98.2 F (36.8 C) 98 F (36.7 C)  TempSrc: Oral Oral Oral Oral  Resp: 18 16 18 19   Height:      Weight:   69.037 kg (152 lb 3.2 oz)   SpO2: 96% 95% 100% 98%   Patient Vitals for the past 24 hrs:  BP Temp Temp src Pulse Resp SpO2 Weight  10/16/11 1410 149/62 mmHg 98 F (36.7 C) Oral 90  19  98 % -  10/16/11 0526 171/70 mmHg 98.2 F (36.8 C) Oral 102  18  100 % 69.037 kg (152 lb 3.2 oz)  10/15/11 2046 161/73 mmHg 98.9 F (37.2 C) Oral 106  16  95 % -    Weight change: 0.181 kg (6.4 oz)  Intake/Output Summary (Last 24 hours) at 10/16/11 1850 Last data filed at 10/16/11 1713  Gross per 24 hour  Intake    241 ml  Output   2050 ml  Net  -1809 ml    Physical Exam: General:answers questions appropriately once aroused, oriented x2, no acute distress. diminished muscle mass  Heart regular rate and rhythm Lungs:clear Abdomen: Soft, Nontender, Nondistended, +bowel sounds.   Lab  Results: Basic Metabolic Panel:  Lab [id number] 0615 10/15/11 0644 10/14/11 0500 10/13/11 0615 10/12/11 0605  NA 128* 127* 122* 121* 119*  K 4.1 3.6 3.3* 4.0 4.0  CL 89* 88* 84* 83* 83*  CO2 28 26 23 21 21   GLUCOSE 227* 152* 119* 89 157*  BUN 97* 91* 91* 93* 86*  CREATININE 2.20* 2.32* 2.50* 2.58* 2.42*  CALCIUM 9.5 8.8 8.7 8.8 8.3*  MG -- -- -- -- --  PHOS 4.3 5.3* 6.3* 5.7* --   Liver Function Tests:  Lab 10/16/11 0615 10/15/11 0644 10/14/11 0500 10/13/11 0615  AST 76* 90* -- --  ALT 48 40 -- --  ALKPHOS 195* 170* -- --  BILITOT 0.4 0.6 -- --  PROT 6.2 5.9* -- --  ALBUMIN 1.2* 1.2* 1.4* 1.4*   CBC:  Lab 10/16/11 0615 10/15/11 0644 10/14/11 0500 10/12/11 0605 10/11/11 0833  WBC 8.8 8.7 10.0 8.4 9.0  NEUTROABS -- -- -- -- --  HGB 8.3* 8.4* 9.3* 8.8* 8.4*  HCT 23.4* 22.8* 25.7* 23.9* 22.7*  MCV 86.0 85.4 84.5 86.0 84.1  PLT 199 217 248 256 242   Cardiac Enzymes: No results found for this basename: CKTOTAL:5,CKMB:5,CKMBINDEX:5,TROPONINI:5 in the last 168 hours BNP (last 3 results)  Basename 10/02/11 1938 10/01/11 1036 03/30/11 1055  PROBNP 396.1 1962.0* 27.0   CBG:  Lab  10/16/11 1548 10/16/11 1102 10/16/11 0625 10/16/11 0258 10/15/11 2109  GLUCAP 135* 93 216* 298* 377*   CBG (last 3)   Basename 10/16/11 1548 10/16/11 1102 10/16/11 0625  GLUCAP 135* 93 216*     Medications: I have reviewed the patient's current medications. Scheduled Meds:    . aspirin EC  81 mg Oral Daily  . atorvastatin  20 mg Oral Daily  . calcitRIOL  0.25 mcg Oral Daily  . capsaicin   Topical BID  . cyanocobalamin  1,000 mcg Intramuscular Q30 days  . darbepoetin (ARANESP) injection - NON-DIALYSIS  100 mcg Subcutaneous Q Mon-1800  . demeclocycline  150 mg Oral Q12H  . furosemide  100 mg Intravenous Q6H  . heparin  5,000 Units Subcutaneous Q8H  . hydrALAZINE  50 mg Oral BID  . insulin aspart  0-9 Units Subcutaneous TID WC  . insulin aspart  10 Units Subcutaneous Once  . insulin  aspart protamine-insulin aspart  20 Units Subcutaneous BID WC  . isosorbide mononitrate  10 mg Oral BID WC  . levalbuterol  0.63 mg Nebulization BID  . pantoprazole  40 mg Oral Q1200  . potassium chloride  40 mEq Oral Daily  . prednisoLONE acetate  1 drop Both Eyes Daily  . senna-docusate  1 tablet Oral BID  . sodium chloride  3 mL Intravenous Q12H   Continuous Infusions:  PRN Meds:.acetaminophen, lactulose, levalbuterol, polyethylene glycol, traMADol  Assessment/Plan: Bilateral lower extremity edema likely due to decompensated acute on chronic diastolic heart failure and CKD 2-D echo 01/13 ejection fraction 0000000, Grade 1 diastolic dysfunction, no valvular abnormalities Renal continues to manage volume status.   Metabolic encephalopathy: At the beginning the patient had confusion probably from Pneumonia, and hyponatremia. Now lethargy is probably due to  Uremia . Patient continues to be a little lethargic but he does awake to voice.  Pneumonia/ Left lower lobe consolidation with effusion on CXR and CT abd pelvis: started IV levaquin 6/19, repeat CXR with increasing L lung consolidation, continue xopenex nebs Clinically improving, afebrile now, WBC normalized, oxygen requirements improved as well Completed a course of 10 days of antibiotic with Levaquin on 6/28   Hyponatremia:  Multifactorial, Initially volume overload, SIADH Was aggressively diuresed from admission On Diuretics and Demeclocycline per Renal  question of depressed LV function per nephrology. 2-D echo ordered.  ARF on CKD2: Renal USG with normal parenchyma and no Hydronephrosis, creatinine was 1.2 on admission SPEP/UPEP pending, ECHO as noted above Suspect secondary to diuresis /ARB, ARB stopped , continue to monitor Per nephrology   DM (diabetes mellitus), type 1, uncontrolled w/ophthalmic complication NovoLog 0000000, cut down to 20 units subcutaneous twice daily.  Continue SSI.   Anemia  Maybe due to CKD, acute  illness, blood draws   Generalized weakness Continue PT/OT recommending SNF.  Hyperlipidemia Continue statin.  HTN: stable, continue Imdur, Hydralazine  Prophylaxis Subcutaneous heparin.  Tachycardia Seem to be improving. Will add beta blocker if heart rate remains above 100.   will defer to nephrology if  needed to be added before then  Code status Full code  Disposition Medically not stable for DC   LOS: 14 days  Willistine Ferrall, Sharlet Salina, MD 10/16/2011, 6:50 PM Pager: [phone]

## 2011-10-16 NOTE — Progress Notes (Signed)
Notified M. Lynch , NP of pt cbg [phone] without a night time coverage, new order received for 10units of novolog insulin. Pt without any s/s of acute distress.

## 2011-10-17 ENCOUNTER — Inpatient Hospital Stay (HOSPITAL_COMMUNITY): Payer: Medicare Other

## 2011-10-17 DIAGNOSIS — E871 Hypo-osmolality and hyponatremia: Secondary | ICD-10-CM

## 2011-10-17 DIAGNOSIS — IMO0002 Reserved for concepts with insufficient information to code with codable children: Secondary | ICD-10-CM

## 2011-10-17 DIAGNOSIS — D696 Thrombocytopenia, unspecified: Secondary | ICD-10-CM

## 2011-10-17 DIAGNOSIS — E1065 Type 1 diabetes mellitus with hyperglycemia: Secondary | ICD-10-CM

## 2011-10-17 DIAGNOSIS — R609 Edema, unspecified: Secondary | ICD-10-CM

## 2011-10-17 LAB — COMPREHENSIVE METABOLIC PANEL
Alkaline Phosphatase: 172 U/L — ABNORMAL HIGH (ref 39–117)
BUN: 91 mg/dL — ABNORMAL HIGH (ref 6–23)
CO2: 30 mEq/L (ref 19–32)
Calcium: 10.3 mg/dL (ref 8.4–10.5)
GFR calc Af Amer: 36 mL/min — ABNORMAL LOW (ref >90)
GFR calc non Af Amer: 31 mL/min — ABNORMAL LOW (ref >90)
Glucose, Bld: 343 mg/dL — ABNORMAL HIGH (ref 70–99)
Total Protein: 6.7 g/dL (ref 6.0–8.3)

## 2011-10-17 LAB — CBC
HCT: 27.5 % — ABNORMAL LOW (ref 39.0–52.0)
Hemoglobin: 9.5 g/dL — ABNORMAL LOW (ref 13.0–17.0)
MCH: 30.4 pg (ref 26.0–34.0)
MCHC: 34.5 g/dL (ref 30.0–36.0)
RBC: 3.13 MIL/uL — ABNORMAL LOW (ref 4.22–5.81)

## 2011-10-17 LAB — GLUCOSE, CAPILLARY
Glucose-Capillary: 345 mg/dL — ABNORMAL HIGH (ref 70–99)
Glucose-Capillary: 226 mg/dL — ABNORMAL HIGH (ref 70–99)

## 2011-10-17 LAB — URINALYSIS, ROUTINE W REFLEX MICROSCOPIC
Bilirubin Urine: NEGATIVE
Glucose, UA: NEGATIVE mg/dL
Specific Gravity, Urine: 1.01 (ref 1.005–1.030)
Urobilinogen, UA: 0.2 mg/dL (ref 0.0–1.0)

## 2011-10-17 LAB — PHOSPHORUS: Phosphorus: 5.6 mg/dL — ABNORMAL HIGH (ref 2.3–4.6)

## 2011-10-17 LAB — URINE MICROSCOPIC-ADD ON

## 2011-10-17 MED ORDER — ASPIRIN 300 MG RE SUPP
150.0000 mg | Freq: Every day | RECTAL | Status: DC
  Administered 2011-10-18: 150 mg via RECTAL
  Filled 2011-10-17 (×2): qty 1

## 2011-10-17 MED ORDER — DEXTROSE 5 % IV SOLN
1.0000 g | INTRAVENOUS | Status: DC
  Administered 2011-10-17 – 2011-10-18 (×2): 1 g via INTRAVENOUS
  Filled 2011-10-17 (×3): qty 10

## 2011-10-17 MED ORDER — PANTOPRAZOLE SODIUM 40 MG IV SOLR
40.0000 mg | Freq: Every day | INTRAVENOUS | Status: DC
  Administered 2011-10-17 – 2011-10-27 (×11): 40 mg via INTRAVENOUS
  Filled 2011-10-17 (×13): qty 40

## 2011-10-17 MED ORDER — HYDRALAZINE HCL 20 MG/ML IJ SOLN
5.0000 mg | Freq: Three times a day (TID) | INTRAMUSCULAR | Status: DC
  Administered 2011-10-17 – 2011-10-28 (×30): 5 mg via INTRAVENOUS
  Filled 2011-10-17 (×36): qty 0.25

## 2011-10-17 NOTE — Progress Notes (Signed)
[facility]  Subjective:  Awake, though weak.  Knows daughter-in-law (Dottie--wife of son Doren Custard) is in room   Objective: Vital signs in last 24 hours: Blood pressure 150/73, pulse 95, temperature 98.1 F (36.7 C), temperature source Oral, resp. rate 20, height 5\' 10"  (1.778 m), weight 66.679 kg (147 lb), SpO2 97.00%.    PHYSICAL EXAM General--as above Chest--crackles L base Heart--no rub Abd--nontender Extr--still with pitting pretibial edema  Date   Weight  20 Jun  70.1 kg  26 Jun  71.2 kg  27 Jun  70.1 kg  28 Jun  68.9 kg  29 Jun  69 kg 30 Jun  66.7 kg   Lab Results:   Lab 10/17/11 0610 10/16/11 0615 10/15/11 0644  NA 127* 128* 127*  K 4.3 4.1 3.6  CL 87* 89* 88*  CO2 30 28 26   BUN 91* 97* 91*  CREATININE 1.89* 2.20* 2.32*  ALB -- -- --  GLUCOSE 343* -- --  CALCIUM 10.3 9.5 8.8  PHOS 5.6* 4.3 5.3*     Basename 10/17/11 0610 10/16/11 0615  WBC 8.2 8.8  HGB 9.5* 8.3*  HCT 27.5* 23.4*  PLT 197 199    Assessment/Plan: 1. Hyponatremia: Does not appear dry to me. Weight down a little morewith IV furosemide. I still would continue to give furosemide. Cr a little lower today. He may be on wrong side of Starling curve and as edema and vol overload improve, then LV function may improve. I wonder if LV function is depressed--requiring this high dose of diuretics--Recommend-- 2D echo to assess LV function  2. Hypokalemia:KCl 40 q day-K 4.3 today.Increased KCl to 40 BID yesterday. Also on demeclocycline  3. Chronic kidney disease: Cr starting to decrease! Ultrasound : R 11.9, L 12 cm, no hydro. SPEP and UPEP still pending. On calcitriol for ? (PTH 49.3 in Jan 2013). He was on this PTA --prescribed by his primary care doc (Dr. Renato Shin)  4. Anemia: Likely secondary to hospitalization/frequent blood draws/chronic kidney disease. FeTIBC 25% sat, ferritin 622. On aranesp 100/wk. Hgb 8.2 today. Will also check hemocult with high BUN  5. Altered MS- not  sure if only due to hyponatremia or acute changes due to PNA. He said he would go on dialysis if necessary. I discussed this with him and his son. I hope this won't be necessary 6. Disposition: continue to await medical stability prior to DC to SNF or transfer to rehab  7. HTN- Hydralazine BID. BP 150/73 today. May get lower as diuresis continues.  On ARB PTA     LOS: 15 days   Alecia Doi F 10/17/2011,12:53 PM   .labalb

## 2011-10-17 NOTE — Progress Notes (Signed)
Brief Summary:Eric [name] is an 78 y.o. male with history of hypertension, diabetes, chronic renal insufficiency, hypertension, elevated PSA, lived alone and was actively working, presented to the urgent care clinic complaining of bilateral pedal edema along with been feeling weak.  Evaluation showed hyponatremia with serum sodium of 122, creatinine of 1.5 He was felt to have hyponatremia induced by SIADH and hypervolemia, His volume status is being managed by Renal for last 8 days, he was aggressively diuresed with large doses IV lasix for several days, then demeclocycline was added to his regimen, He was also noted to have pneumonia on LLL consolidation on CXR and CT abd, hence started with Levaquin, has improved from this standpoint.  Subjective: Patient is very sleepy today.  Family is at the bedside.  Objective: Vital signs in last 24 hours: Filed Vitals:   10/15/11 1451 10/15/11 2046 10/16/11 0526 10/16/11 1410  BP: 170/66 161/73 171/70 149/62  Pulse: 104 106 102 90  Temp: 98.4 F (36.9 C) 98.9 F (37.2 C) 98.2 F (36.8 C) 98 F (36.7 C)  TempSrc: Oral Oral Oral Oral  Resp: 18 16 18 19   Height:      Weight:   69.037 kg (152 lb 3.2 oz)   SpO2: 96% 95% 100% 98%   Patient Vitals for the past 24 hrs:  BP Temp Temp src Pulse Resp SpO2 Weight  10/16/11 1410 149/62 mmHg 98 F (36.7 C) Oral 90  19  98 % -  10/16/11 0526 171/70 mmHg 98.2 F (36.8 C) Oral 102  18  100 % 69.037 kg (152 lb 3.2 oz)  10/15/11 2046 161/73 mmHg 98.9 F (37.2 C) Oral 106  16  95 % -    Weight change: 0.181 kg (6.4 oz)  Intake/Output Summary (Last 24 hours) at 10/16/11 1850 Last data filed at 10/16/11 1713  Gross per 24 hour  Intake    241 ml  Output   2050 ml  Net  -1809 ml    Physical Exam: General:answers questions appropriately once aroused, oriented x2, no acute distress. diminished muscle mass  Heart regular rate and rhythm Lungs:clear Abdomen: Soft, Nontender, Nondistended, +bowel  sounds. Extremities: Trace edema   Lab Results: Basic Metabolic Panel:  Lab [id number] 0615 10/15/11 0644 10/14/11 0500 10/13/11 0615 10/12/11 0605  NA 128* 127* 122* 121* 119*  K 4.1 3.6 3.3* 4.0 4.0  CL 89* 88* 84* 83* 83*  CO2 28 26 23 21 21   GLUCOSE 227* 152* 119* 89 157*  BUN 97* 91* 91* 93* 86*  CREATININE 2.20* 2.32* 2.50* 2.58* 2.42*  CALCIUM 9.5 8.8 8.7 8.8 8.3*  MG -- -- -- -- --  PHOS 4.3 5.3* 6.3* 5.7* --   Liver Function Tests:  Lab 10/16/11 0615 10/15/11 0644 10/14/11 0500 10/13/11 0615  AST 76* 90* -- --  ALT 48 40 -- --  ALKPHOS 195* 170* -- --  BILITOT 0.4 0.6 -- --  PROT 6.2 5.9* -- --  ALBUMIN 1.2* 1.2* 1.4* 1.4*   CBC:  Lab 10/16/11 0615 10/15/11 0644 10/14/11 0500 10/12/11 0605 10/11/11 0833  WBC 8.8 8.7 10.0 8.4 9.0  NEUTROABS -- -- -- -- --  HGB 8.3* 8.4* 9.3* 8.8* 8.4*  HCT 23.4* 22.8* 25.7* 23.9* 22.7*  MCV 86.0 85.4 84.5 86.0 84.1  PLT 199 217 248 256 242   Cardiac Enzymes: No results found for this basename: CKTOTAL:5,CKMB:5,CKMBINDEX:5,TROPONINI:5 in the last 168 hours BNP (last 3 results)  Basename 10/02/11 1938 10/01/11 1036 03/30/11 1055  PROBNP 396.1 1962.0* 27.0   CBG:  Lab 10/16/11 1548 10/16/11 1102 10/16/11 0625 10/16/11 0258 10/15/11 2109  GLUCAP 135* 93 216* 298* 377*   CBG (last 3)   Basename 10/16/11 1548 10/16/11 1102 10/16/11 0625  GLUCAP 135* 93 216*     Medications: I have reviewed the patient's current medications. Scheduled Meds:    . aspirin EC  81 mg Oral Daily  . atorvastatin  20 mg Oral Daily  . calcitRIOL  0.25 mcg Oral Daily  . capsaicin   Topical BID  . cyanocobalamin  1,000 mcg Intramuscular Q30 days  . darbepoetin (ARANESP) injection - NON-DIALYSIS  100 mcg Subcutaneous Q Mon-1800  . demeclocycline  150 mg Oral Q12H  . furosemide  100 mg Intravenous Q6H  . heparin  5,000 Units Subcutaneous Q8H  . hydrALAZINE  50 mg Oral BID  . insulin aspart  0-9 Units Subcutaneous TID WC  . insulin aspart   10 Units Subcutaneous Once  . insulin aspart protamine-insulin aspart  20 Units Subcutaneous BID WC  . isosorbide mononitrate  10 mg Oral BID WC  . levalbuterol  0.63 mg Nebulization BID  . pantoprazole  40 mg Oral Q1200  . potassium chloride  40 mEq Oral Daily  . prednisoLONE acetate  1 drop Both Eyes Daily  . senna-docusate  1 tablet Oral BID  . sodium chloride  3 mL Intravenous Q12H   Continuous Infusions:  PRN Meds:.acetaminophen, lactulose, levalbuterol, polyethylene glycol, traMADol  Assessment/Plan: Bilateral lower extremity edema likely due to decompensated acute on chronic diastolic heart failure and CKD 2-D echo 01/13 ejection fraction 0000000, Grade 1 diastolic dysfunction, no valvular abnormalities Renal continues to manage volume status.   Metabolic encephalopathy: At the beginning the patient had confusion probably from Pneumonia, and hyponatremia. Ordered MRI. Will consult neurology in the morning.  Pneumonia/ Left lower lobe consolidation with effusion on CXR and CT abd pelvis: started IV levaquin 6/19, repeat CXR with increasing L lung consolidation, continue xopenex nebs Clinically improving, afebrile now, WBC normalized, oxygen requirements improved as well Completed a course of 10 days of antibiotic with Levaquin on 6/28   Hyponatremia:  Multifactorial, Initially volume overload, SIADH Was aggressively diuresed from admission On Diuretics and Demeclocycline per Renal  question of depressed LV function per nephrology. 2-D echo ordered.  ARF on CKD2: Renal USG with normal parenchyma and no Hydronephrosis, creatinine was 1.2 on admission SPEP/UPEP pending, ECHO as noted above Suspect secondary to diuresis /ARB, ARB stopped , continue to monitor Per nephrology   DM (diabetes mellitus), type 1, uncontrolled w/ophthalmic complication Continue SSI.   Anemia  Maybe due to CKD, acute illness, blood draws   Generalized weakness Continue PT/OT recommending  SNF.  Hyperlipidemia Continue statin.  HTN: Stable, continue Imdur, Hydralazine  Dysphagia Patient was noted to be pocketing food. Speech was consulted. Recommendation is n.p.o. Will continue n.p.o. and monitor mental status.  Prophylaxis Subcutaneous heparin.  Tachycardia Seem to be improving. Will add beta blocker if heart rate remains above 100.   will defer to nephrology if  needed to be added before then  Code status Full code  Disposition Medically not stable for DC   LOS: 14 days  Daelon Dunivan, Sharlet Salina, MD 10/16/2011, 6:50 PM Pager: [phone]

## 2011-10-17 NOTE — Plan of Care (Signed)
Problem: Phase I Progression Outcomes Goal: EF % per last Echo/documented,Core Reminder form on chart Outcome: Completed/Met Date Met:  10/17/11 04/21/2011 55-60%

## 2011-10-17 NOTE — Evaluation (Signed)
Clinical/Bedside Swallow Evaluation Patient Details  Name: Eric [name] MRN: [id number] Date of Birth: [birth year]  Today's Date: 10/17/2011 Time: 1240-1308 SLP Time Calculation (min): 28 min  Past Medical History:  Past Medical History  Diagnosis Date  . ANEMIA-NOS 02/17/2007  . DEPRESSION 12/25/2008  . HYPERTENSION 02/17/2007  . GERD 02/17/2007  . OSTEOARTHRITIS 03/04/2010  . DIABETES MELLITUS, TYPE I, CONTROLLED, WITH RETINOPATHY   . Gastroparesis   . BENIGN PROSTATIC HYPERTROPHY   . VITAMIN B12 DEFICIENCY   . BACK PAIN, LUMBAR   . LIVER DISORDER   . PSA, INCREASED   . Hyperlipidemia   . Diabetes mellitus   . Pneumonia   . Anxiety    Past Surgical History:  Past Surgical History  Procedure Date  . Transurethral resection of prostate    HPI:      Assessment / Plan / Recommendation Clinical Impression  Pt is profoundly weak, and exhibits voice quality change with thin liquids. RN reports oral holding. At this point, po intake is not recommended, as pt is so weak, and continues to exhibit deterioration of responsiveness and strength.  ST to continue to follow for improvement in status, and appropriateness to begin po intake.    Aspiration Risk  Severe    Diet Recommendation NPO   Medication Administration: Via alternative means          Frequency and Duration min 2x/week  1 week   Pertinent Vitals/Pain No pain indicated   SLP Swallow Goals Patient will consume recommended diet without observed clinical signs of aspiration with: Moderate assistance Swallow Study Goal #1 - Progress: Progressing toward goal Patient will utilize recommended strategies during swallow to increase swallowing safety with: Moderate assistance Swallow Study Goal #2 - Progress: Progressing toward goal   Swallow Study Prior Functional Status   Regular diet    General Date of Onset: 10/17/11 Type of Study: Bedside swallow evaluation Diet Prior to this Study: NPO;IV Temperature  Spikes Noted: No Respiratory Status: Supplemental O2 delivered via (comment) ([place]@1 .5L) Behavior/Cognition: Alert;Cooperative;Other (comment) (profoundly weak) Oral Cavity - Dentition: Adequate natural dentition Self-Feeding Abilities: Total assist Patient Positioning: Upright in bed Baseline Vocal Quality: Clear Volitional Cough: Strong Volitional Swallow: Unable to elicit    Oral/Motor/Sensory Function Overall Oral Motor/Sensory Function: Impaired (generalized weakness)   Ice Chips Ice chips: Not tested   Thin Liquid Thin Liquid: Impaired Presentation: Straw Oral Phase Impairments: Impaired anterior to posterior transit Oral Phase Functional Implications: Prolonged oral transit;Oral holding Pharyngeal  Phase Impairments: Suspected delayed Swallow;Wet Vocal Quality    Nectar Thick Nectar Thick Liquid: Not tested   Honey Thick Honey Thick Liquid: Not tested   Puree Puree: Within functional limits Presentation: Spoon   Solid Solid: Not tested   Eric [name] B. [place], [place], [place]   [name] 10/17/2011,1:21 PM

## 2011-10-17 NOTE — Plan of Care (Signed)
Problem: Phase III Progression Outcomes Goal: Tolerating diet Outcome: Progressing Eric [name] B. [place], [facility], [place]

## 2011-10-18 ENCOUNTER — Encounter (HOSPITAL_COMMUNITY): Payer: Self-pay | Admitting: Neurology

## 2011-10-18 DIAGNOSIS — E1065 Type 1 diabetes mellitus with hyperglycemia: Secondary | ICD-10-CM

## 2011-10-18 DIAGNOSIS — G9341 Metabolic encephalopathy: Secondary | ICD-10-CM | POA: Insufficient documentation

## 2011-10-18 DIAGNOSIS — H579 Unspecified disorder of eye and adnexa: Secondary | ICD-10-CM

## 2011-10-18 DIAGNOSIS — E871 Hypo-osmolality and hyponatremia: Secondary | ICD-10-CM

## 2011-10-18 DIAGNOSIS — R531 Weakness: Secondary | ICD-10-CM | POA: Diagnosis present

## 2011-10-18 LAB — CBC
MCV: 89.1 fL (ref 78.0–100.0)
Platelets: 213 10*3/uL (ref 150–400)
RDW: 14.5 % (ref 11.5–15.5)
WBC: 9.7 10*3/uL (ref 4.0–10.5)

## 2011-10-18 LAB — AMMONIA: Ammonia: 13 umol/L (ref 11–60)

## 2011-10-18 LAB — BASIC METABOLIC PANEL
Calcium: 10.8 mg/dL — ABNORMAL HIGH (ref 8.4–10.5)
Chloride: 93 mEq/L — ABNORMAL LOW (ref 96–112)
Creatinine, Ser: 1.99 mg/dL — ABNORMAL HIGH (ref 0.50–1.35)
GFR calc Af Amer: 34 mL/min — ABNORMAL LOW (ref >90)
GFR calc non Af Amer: 29 mL/min — ABNORMAL LOW (ref >90)

## 2011-10-18 LAB — GLUCOSE, CAPILLARY

## 2011-10-18 LAB — PARATHYROID HORMONE, INTACT (NO CA): PTH: 10 pg/mL — ABNORMAL LOW (ref 14.0–72.0)

## 2011-10-18 MED ORDER — INSULIN GLARGINE 100 UNIT/ML ~~LOC~~ SOLN
8.0000 [IU] | Freq: Every day | SUBCUTANEOUS | Status: DC
  Administered 2011-10-18 – 2011-10-19 (×2): 8 [IU] via SUBCUTANEOUS

## 2011-10-18 MED ORDER — ASPIRIN 300 MG RE SUPP
300.0000 mg | Freq: Every day | RECTAL | Status: AC
  Administered 2011-10-19 – 2011-10-23 (×5): 300 mg via RECTAL
  Filled 2011-10-18 (×5): qty 1

## 2011-10-18 MED ORDER — MORPHINE SULFATE 2 MG/ML IJ SOLN
2.0000 mg | INTRAMUSCULAR | Status: DC | PRN
  Administered 2011-10-18 – 2011-10-27 (×2): 2 mg via INTRAVENOUS
  Filled 2011-10-18 (×2): qty 1

## 2011-10-18 NOTE — Progress Notes (Signed)
  Echocardiogram 2D Echocardiogram has been performed.  JAKHI, PARA 10/18/2011, 2:15 PM

## 2011-10-18 NOTE — Plan of Care (Signed)
Problem: Consults Goal: Heart Failure Patient Education (See Patient Education module for education specifics.)  Outcome: Not Applicable Date Met:  [id number] Pt is arousable but unteachable at this time   Problem: Phase III Progression Outcomes Goal: Tolerating diet Outcome: Not Met (add Reason) Pt failed SLP eval on Sunday June 30th

## 2011-10-18 NOTE — Progress Notes (Addendum)
Brief Summary:Eric [name] is an 78 y.o. male with history of hypertension, diabetes, chronic renal insufficiency, hypertension, elevated PSA, lived alone and was actively working, presented to the urgent care clinic complaining of bilateral pedal edema along with been feeling weak.  Evaluation showed hyponatremia with serum sodium of 122, creatinine of 1.5 He was felt to have hyponatremia induced by SIADH and hypervolemia, His volume status is being managed by Renal for last 8 days, he was aggressively diuresed with large doses IV lasix for several days, then demeclocycline was added to his regimen, He was also noted to have pneumonia on LLL consolidation on CXR and CT abd, hence started with Levaquin, has improved from this standpoint.  Subjective: Patient is very sleepy today.  Patient is extremely weak. Objective: Vital signs in last 24 hours: Filed Vitals:   10/15/11 1451 10/15/11 2046 10/16/11 0526 10/16/11 1410  BP: 170/66 161/73 171/70 149/62  Pulse: 104 106 102 90  Temp: 98.4 F (36.9 C) 98.9 F (37.2 C) 98.2 F (36.8 C) 98 F (36.7 C)  TempSrc: Oral Oral Oral Oral  Resp: 18 16 18 19   Height:      Weight:   69.037 kg (152 lb 3.2 oz)   SpO2: 96% 95% 100% 98%   Patient Vitals for the past 24 hrs:  BP Temp Temp src Pulse Resp SpO2 Weight  10/16/11 1410 149/62 mmHg 98 F (36.7 C) Oral 90  19  98 % -  10/16/11 0526 171/70 mmHg 98.2 F (36.8 C) Oral 102  18  100 % 69.037 kg (152 lb 3.2 oz)  10/15/11 2046 161/73 mmHg 98.9 F (37.2 C) Oral 106  16  95 % -    Weight change: 0.181 kg (6.4 oz)  Intake/Output Summary (Last 24 hours) at 10/16/11 1850 Last data filed at 10/16/11 1713  Gross per 24 hour  Intake    241 ml  Output   2050 ml  Net  -1809 ml    Physical Exam: General:answers questions appropriately once aroused, oriented x2, no acute distress. diminished muscle mass  Heart regular rate and rhythm Lungs:clear Abdomen: Soft, Nontender, Nondistended, +bowel  sounds. Extremities: Trace edema   Lab Results: Basic Metabolic Panel:  Lab [id number] 0615 10/15/11 0644 10/14/11 0500 10/13/11 0615 10/12/11 0605  NA 128* 127* 122* 121* 119*  K 4.1 3.6 3.3* 4.0 4.0  CL 89* 88* 84* 83* 83*  CO2 28 26 23 21 21   GLUCOSE 227* 152* 119* 89 157*  BUN 97* 91* 91* 93* 86*  CREATININE 2.20* 2.32* 2.50* 2.58* 2.42*  CALCIUM 9.5 8.8 8.7 8.8 8.3*  MG -- -- -- -- --  PHOS 4.3 5.3* 6.3* 5.7* --   Liver Function Tests:  Lab 10/16/11 0615 10/15/11 0644 10/14/11 0500 10/13/11 0615  AST 76* 90* -- --  ALT 48 40 -- --  ALKPHOS 195* 170* -- --  BILITOT 0.4 0.6 -- --  PROT 6.2 5.9* -- --  ALBUMIN 1.2* 1.2* 1.4* 1.4*   CBC:  Lab 10/16/11 0615 10/15/11 0644 10/14/11 0500 10/12/11 0605 10/11/11 0833  WBC 8.8 8.7 10.0 8.4 9.0  NEUTROABS -- -- -- -- --  HGB 8.3* 8.4* 9.3* 8.8* 8.4*  HCT 23.4* 22.8* 25.7* 23.9* 22.7*  MCV 86.0 85.4 84.5 86.0 84.1  PLT 199 217 248 256 242   Cardiac Enzymes: No results found for this basename: CKTOTAL:5,CKMB:5,CKMBINDEX:5,TROPONINI:5 in the last 168 hours BNP (last 3 results)  Basename 10/02/11 1938 10/01/11 1036 03/30/11 1055  PROBNP 396.1  1962.0* 27.0   CBG:  Lab 10/16/11 1548 10/16/11 1102 10/16/11 0625 10/16/11 0258 10/15/11 2109  GLUCAP 135* 93 216* 298* 377*   CBG (last 3)   Basename 10/16/11 1548 10/16/11 1102 10/16/11 0625  GLUCAP 135* 93 216*     Medications: I have reviewed the patient's current medications. Scheduled Meds:    . aspirin EC  81 mg Oral Daily  . atorvastatin  20 mg Oral Daily  . calcitRIOL  0.25 mcg Oral Daily  . capsaicin   Topical BID  . cyanocobalamin  1,000 mcg Intramuscular Q30 days  . darbepoetin (ARANESP) injection - NON-DIALYSIS  100 mcg Subcutaneous Q Mon-1800  . demeclocycline  150 mg Oral Q12H  . furosemide  100 mg Intravenous Q6H  . heparin  5,000 Units Subcutaneous Q8H  . hydrALAZINE  50 mg Oral BID  . insulin aspart  0-9 Units Subcutaneous TID WC  . insulin aspart   10 Units Subcutaneous Once  . insulin aspart protamine-insulin aspart  20 Units Subcutaneous BID WC  . isosorbide mononitrate  10 mg Oral BID WC  . levalbuterol  0.63 mg Nebulization BID  . pantoprazole  40 mg Oral Q1200  . potassium chloride  40 mEq Oral Daily  . prednisoLONE acetate  1 drop Both Eyes Daily  . senna-docusate  1 tablet Oral BID  . sodium chloride  3 mL Intravenous Q12H   Continuous Infusions:  PRN Meds:.acetaminophen, lactulose, levalbuterol, polyethylene glycol, traMADol  Assessment/Plan: Bilateral lower extremity edema likely due to decompensated acute on chronic diastolic heart failure and CKD 2-D echo 01/13 ejection fraction 0000000, Grade 1 diastolic dysfunction, no valvular abnormalities Renal continues to manage volume status.   Metabolic encephalopathy: At the beginning the patient had confusion probably from Pneumonia, and hyponatremia. Head CT and MRI both negative for acute stroke . Consulted neurology to assist with evaluation.  Pneumonia/ Left lower lobe consolidation with effusion on CXR and CT abd pelvis: started IV levaquin 6/19, repeat CXR with increasing L lung consolidation, continue xopenex nebs Clinically improving, afebrile now, WBC normalized, oxygen requirements improved as well Completed a course of 10 days of antibiotic with Levaquin on 6/28   Hyponatremia:  Multifactorial, Initially volume overload, SIADH Was aggressively diuresed from admission On Diuretics and Demeclocycline per Renal  question of depressed LV function per nephrology. 2-D echo pending.  ARF on CKD2: Renal USG with normal parenchyma and no Hydronephrosis, creatinine was 1.2 on admission SPEP/UPEP pending, ECHO as noted above Suspect secondary to diuresis /ARB, ARB stopped , continue to monitor Per nephrology   DM (diabetes mellitus), type 1, uncontrolled w/ophthalmic complication Continue SSI.  Added Lantus  Anemia  Maybe due to CKD, acute illness, blood draws    Generalized weakness Continue PT/OT recommending SNF.  Hyperlipidemia Continue statin.  HTN: Stable, continue Imdur, Hydralazine  Dysphagia Patient was noted to be pocketing food. Speech was consulted. Recommendation is n.p.o. Will continue n.p.o. and monitor mental status.  Prophylaxis Subcutaneous heparin.  Tachycardia Seem to be improving. Will add beta blocker if heart rate remains above 100.   will defer to nephrology if  needed to be added before then  Code status Full code  Disposition Medically not stable for DC. Was not able to get in touch with family today. Left message on voicemail.   LOS: 14 days  Darden Flemister, Sharlet [place], MD 10/18/2011, 6:50 PM Pager: [phone] Discussed poor prognosis with the patient's son. Patient son decided to make patient DO NOT RESUSCITATE DO NOT INTUBATE.  Will continue to monitor.

## 2011-10-18 NOTE — Progress Notes (Signed)
Inpatient Diabetes Program Recommendations  AACE/ADA: New Consensus Statement on Inpatient Glycemic Control (2009)  Target Ranges:  Prepandial:   less than 140 mg/dL      Peak postprandial:   less than 180 mg/dL (1-2 hours)      Critically ill patients:  140 - 180 mg/dL   Reason for Visit: Results for Eric [name], Eric [name] (MRN [id number]) as of 10/18/2011 15:07  Ref. Range 10/17/2011 16:53 10/17/2011 21:38 10/18/2011 06:48 10/18/2011 11:26  Glucose-Capillary Latest Range: 70-99 mg/dL 226 (H) 245 (H) 292 (H) 216 (H)   Note patient was on 70/30 prior to admit.  Currently he is NPO.    Inpatient Diabetes Program Recommendations Insulin - Basal: Please add Lantus 14 units daily.  Note: Will follow.

## 2011-10-18 NOTE — Progress Notes (Signed)
Speech Language Pathology Dysphagia Treatment Patient Details Name: Eric [name] MRN: [id number] DOB: [birth year] Today's Date: 10/18/2011 Time: [id number] SLP Time Calculation (min): 16 min  Assessment / Plan / Recommendation Clinical Impression  Pt today with poor sustained alertness (up to 10 seconds), woke briefly and answered SLP questions, stating we were at [facility] in weak whispered voice.  Pt did follow commands and SLP did not note focal cranial nerve deficit but continues with severe weakness.  Observed pt with a single tsp of nectar thickened juice with delay in swallow, wincing with swallow (? pain) but no clinical s/s of aspiration.  Pt does acknowledges problems swallowing x3 days including coughing with intake.  Weak cough noted without po intake x1 during session.     PO again not recommended due to poor LOA and weakness.  SLP will follow up next date to reassess po readiness, hopeful for improvement of swallow with improved LOC but based on pt report suspect a degree of premorbid dysphagia present.  Rec aggressive oral care due to npo status.      Diet Recommendation  Continue with Current Diet: NPO    SLP Plan     Pertinent Vitals/Pain Afebrile, decr LOA   Swallowing Goals  SLP Swallowing Goals Patient will utilize recommended strategies during swallow to increase swallowing safety with: Moderate cueing Goal #3: Pt will maintain level of alertness for 3 minutes to accept po bolus and aid in determining readiness for po intake with min verbal cues.    General Temperature Spikes Noted: No Respiratory Status: Room air Behavior/Cognition: Cooperative;Decreased sustained attention;Lethargic Oral Cavity - Dentition: Adequate natural dentition Patient Positioning: Upright in bed  Oral Cavity - Oral Hygiene     Dysphagia Treatment Treatment focused on: Patient/family/caregiver education;Upgraded PO texture trials;Other (comment) (readiness for po  intake) Treatment Methods/Modalities: Skilled observation Patient observed directly with PO's: Yes Type of PO's observed: Nectar-thick liquids Feeding: Total assist Liquids provided via: Teaspoon Oral Phase Signs & Symptoms: Prolonged bolus formation;Prolonged oral phase Pharyngeal Phase Signs & Symptoms: Suspected delayed swallow initiation (wincing with swallow, ? pain) Type of cueing: Verbal;Tactile Amount of cueing: Total   Luanna Salk, [facility] SLP [phone]

## 2011-10-18 NOTE — Progress Notes (Signed)
Physical Therapy Treatment Patient Details Name: Eric [name] MRN: [id number] DOB: Oct 31, 1923 Today's Date: 10/18/2011 Time: [id number] PT Time Calculation (min): 30 min  PT Assessment / Plan / Recommendation Comments on Treatment Session  Pt continues to decline in functional mobility and ability to participate in therapy.  Will re-assess pt next visit to determine if continued PT is approate.     Follow Up Recommendations  Skilled nursing facility    Barriers to Discharge        Equipment Recommendations  Defer to next venue    Recommendations for Other Services    Frequency Min 3X/week   Plan Discharge plan remains appropriate;Frequency remains appropriate    Precautions / Restrictions Precautions Precautions: Fall Restrictions Weight Bearing Restrictions: No   Pertinent Vitals/Pain No c/o pain    Mobility  Bed Mobility Bed Mobility: Sitting - Scoot to Edge of Bed;Rolling Left;Left Sidelying to Sit Rolling Left: 2: Max assist Left Sidelying to Sit: 2: Max assist Supine to Sit: Not tested (comment) Sitting - Scoot to Edge of Bed: 2: Max assist Sit to Supine: Not Tested (comment) Details for Bed Mobility Assistance: Max assist for all bed mobility secondary to pt falling asleep with commands, and limited activice participation.   Transfers Transfers: Sit to Stand;Stand to [employer] Transfers Sit to Stand: 2: Max assist;With upper extremity assist;From bed Stand to Sit: 2: Max assist;To chair/3-in-1;With upper extremity assist Stand Pivot Transfers: 1: +1 Total assist Details for Transfer Assistance: Continuous step by step cueing.  Pt had difficulty standing with RW. Manual facilitation to initiate pivot. Pt presents with flexed trunk posture posture which increased as pt fatigued.  Max assist to control pt's descent to chair.  Ambulation/Gait Ambulation/Gait Assistance: 1: +2 Total assist Ambulation/Gait: Patient Percentage: 30% Assistive device: Rolling  walker Ambulation/Gait Assistance Details: Pt required total assist to initiate gait.  Pt presents with flexed trunk posture and attempting to sit throughout gait training.  Gait Pattern: Step-to pattern;Decreased step length - right;Decreased step length - left;Decreased hip/knee flexion - right;Decreased hip/knee flexion - left;Trunk flexed Stairs: No Wheelchair Mobility Wheelchair Mobility: No    Exercises     PT Diagnosis:    PT Problem List:   PT Treatment Interventions:     PT Goals Acute Rehab PT Goals PT Goal Formulation: Patient unable to participate in goal setting Time For Goal Achievement: 11/01/11 Pt will go Supine/Side to Sit: with min assist;with HOB 0 degrees PT Goal: Supine/Side to Sit - Progress: Progressing toward goal Pt will Sit at [facility] of Bed: with mod assist;3-5 min;with bilateral upper extremity support PT Goal: Sit at Edge Of Bed - Progress: Progressing toward goal Pt will go Sit to Supine/Side: with min assist;with HOB 0 degrees PT Goal: Sit to Supine/Side - Progress: Revised due to lack of progress Pt will go Sit to Stand: with min assist;with upper extremity assist PT Goal: Sit to Stand - Progress: Not progressing Pt will go Stand to Sit: with min assist;with upper extremity assist PT Goal: Stand to Sit - Progress: Revised due to lack of progress Pt will Transfer Bed to Chair/Chair to Bed: with min assist PT Transfer Goal: Bed to Chair/Chair to Bed - Progress: Revised due to lack of progress Pt will Ambulate: 1 - 15 feet;with mod assist PT Goal: Ambulate - Progress: Revised due to lack of progress Pt will Go Up / Down Stairs: 6-9 stairs;with modified independence;with rail(s);with cane PT Goal: Up/Down Stairs - Progress: Discontinued (comment)  Visit Information  Last PT Received On: 10/18/11    Subjective Data      Cognition  Overall Cognitive Status: Difficult to assess (pt lethargic not answering questions .  ) Arousal/Alertness:  Lethargic Behavior During Session: Lethargic Attention - Other Comments: also limited by lethargy making it hard to stay on task Awareness of Deficits: Pt so lethergic it was difficult to fully assess awareness. Cognition - Other Comments: Pt w obvious decline from evaluation.  Pt very lethargic and confused. Pt only following one step commands.    Balance  Balance Balance Assessed: Yes Static Sitting Balance Static Sitting - Balance Support: Bilateral upper extremity supported;Feet supported Static Sitting - Level of Assistance: 4: Min assist Static Sitting - Comment/# of Minutes: Pt sat on EOB for approximately 3 minutes with min assist to prevent anterior lean as pt was falling asleep.    End of Session PT - End of Session Equipment Utilized During Treatment: Gait belt;Oxygen Activity Tolerance: Patient limited by fatigue Patient left: in chair;with call bell/phone within reach Nurse Communication: Mobility status;Other (comment)   GP     Jazelle Achey 10/18/2011, 1:26 PM Jaice Lague L. Sangeeta Youse DPT [phone]

## 2011-10-18 NOTE — Consult Note (Signed)
[facility] CONSULT NOTE     Reason for Consult: confusion in setting of hyponatremia    HPI:    Eric [name] is an 78 y.o. male with history of hypertension, diabetes, chronic renal insufficiency. Patient lives alone and is still actively working, presents to the urgent care clinic complaining of bilateral pedal edema along with been feeling weak.  Initial evaluation in ED showed hyponatremia with serum sodium of 119, creatinine of 1.5, bicarbonate of 19 and chloride of 89. Per notes "hyponatremia induced by SIADH and hypervolemia.He was aggressively diuresed with large doses IV lasix for several days, then demeclocycline was added to his regimen".  While hospitalized he was also noted to have pneumonia on LLL and started with Levaquin. On 10/16/11 patient had a BG of 377 and he had also missed 2 days of Lasix.  Over last three days BG has been elevated 216-345, sodium is slowly rising to 127, BUN and creatinine remain elevated, phosphorus 5.6, AST and ALT 114/69 respectively, MRI showing generalized brain atrophy. Mild chronic small vessel change but no acute intracranial findings.  Neurology asked to evaluate AMS.      Past Medical History  Diagnosis Date  . ANEMIA-NOS 02/17/2007  . DEPRESSION 12/25/2008  . HYPERTENSION 02/17/2007  . GERD 02/17/2007  . OSTEOARTHRITIS 03/04/2010  . DIABETES MELLITUS, TYPE I, CONTROLLED, WITH RETINOPATHY   . Gastroparesis   . BENIGN PROSTATIC HYPERTROPHY   . VITAMIN B12 DEFICIENCY   . BACK PAIN, LUMBAR   . LIVER DISORDER   . PSA, INCREASED   . Hyperlipidemia   . Diabetes mellitus   . Pneumonia   . Anxiety     Past Surgical History  Procedure Date  . Transurethral resection of prostate     Family History  Problem Relation Age of Onset  . Cancer Father     had uncertain type of cancer  . Cancer Brother     Prostate Cancer  . Cancer Brother     Prostate Cancer    Social History:  reports that he has  never smoked. He has never used smokeless tobacco. He reports that he does not drink alcohol or use illicit drugs.  No Known Allergies  Medications:    Prior to Admission:  Prescriptions prior to admission  Medication Sig Dispense Refill  . acetaminophen (TYLENOL) 500 MG tablet Take 1,000 mg by mouth every 6 (six) hours as needed. For pain.       Marland Kitchen aspirin 81 MG tablet Take 81 mg by mouth daily.        Marland Kitchen atorvastatin (LIPITOR) 20 MG tablet Take 1 tablet (20 mg total) by mouth daily.  90 tablet  1  . BAYER CONTOUR TEST test strip USE TWICE DAILY  100 strip  4  . BAYER CONTOUR TEST test strip USE TO TEST TWICE DAILY  100 strip  2  . calcitRIOL (ROCALTROL) 0.25 MCG capsule Take 1 capsule (0.25 mcg total) by mouth daily.  30 capsule  11  . clotrimazole (LOTRIMIN) 1 % cream Apply 1 application topically 2 (two) times daily. For irritation.  30 g  3  . cyanocobalamin (,VITAMIN B-12,) 1000 MCG/ML injection Inject 1,000 mcg into the muscle every 30 (thirty) days.      . furosemide (LASIX) 80 MG tablet Take 80 mg by mouth daily.      . hydrALAZINE (APRESOLINE) 25  MG tablet Take 1 tablet (25 mg total) by mouth 3 (three) times daily.  90 tablet  3  . insulin NPH-insulin regular (NOVOLIN 70/30) (70-30) 100 UNIT/ML injection Inject 18-25 Units into the skin 2 (two) times daily with a meal. 18 units with breakfast, and 25 units with the evening meal      . Insulin Syringe-Needle U-100 31G X 5/16" 0.3 ML MISC Use as directed twice a day  60 each  5  . isosorbide mononitrate (ISMO,MONOKET) 10 MG tablet Take 1 tablet (10 mg total) by mouth 2 (two) times daily.  180 tablet  1  . lactulose (CHRONULAC) 10 GM/15ML solution Take 15 ml by mouth twice a day as needed       . losartan (COZAAR) 100 MG tablet Take 1 tablet (100 mg total) by mouth daily.  90 tablet  1  . omeprazole (PRILOSEC) 40 MG capsule Take 1 capsule (40 mg total) by mouth daily.  90 capsule  1  . prednisoLONE acetate (PRED FORTE) 1 % ophthalmic  suspension Place 1 drop into both eyes daily.      . nitroGLYCERIN (NITROSTAT) 0.4 MG SL tablet Place 1 tablet (0.4 mg total) under the tongue every 5 (five) minutes as needed for chest pain.  10 tablet  0  . ofloxacin (OCUFLOX) 0.3 % ophthalmic solution Take 1 tablet by mouth daily.       Scheduled:   . aspirin  150 mg Rectal Daily  . capsaicin   Topical BID  . cefTRIAXone (ROCEPHIN)  IV  1 g Intravenous Q24H  . cyanocobalamin  1,000 mcg Intramuscular Q30 days  . darbepoetin (ARANESP) injection - NON-DIALYSIS  100 mcg Subcutaneous Q Mon-1800  . demeclocycline  150 mg Oral Q12H  . furosemide  100 mg Intravenous Q6H  . heparin  5,000 Units Subcutaneous Q8H  . hydrALAZINE  5 mg Intravenous Q8H  . insulin aspart  0-9 Units Subcutaneous TID WC  . levalbuterol  0.63 mg Nebulization BID  . pantoprazole (PROTONIX) IV  40 mg Intravenous QHS  . potassium chloride  40 mEq Oral Daily  . prednisoLONE acetate  1 drop Both Eyes Daily  . senna-docusate  1 tablet Oral BID  . sodium chloride  3 mL Intravenous Q12H  . DISCONTD: aspirin EC  81 mg Oral Daily  . DISCONTD: atorvastatin  20 mg Oral Daily  . DISCONTD: calcitRIOL  0.25 mcg Oral Daily  . DISCONTD: hydrALAZINE  50 mg Oral BID  . DISCONTD: isosorbide mononitrate  10 mg Oral BID WC  . DISCONTD: pantoprazole  40 mg Oral Q1200    Review of Systems - General ROS: negative for - chills, fatigue, fever or hot flashes Hematological and Lymphatic ROS: negative for - bruising, fatigue, jaundice or pallor Endocrine ROS: negative for - hair pattern changes, hot flashes, mood swings or skin changes Respiratory ROS: negative for - cough, hemoptysis, orthopnea or wheezing Cardiovascular ROS: positive for - dyspnea on exertion, orthopnea,  Gastrointestinal ROS: negative for - abdominal pain, appetite loss, blood in stools, diarrhea or hematemesis Musculoskeletal ROS: negative for - joint pain, joint stiffness, joint swelling or muscle pain Neurological  ROS: positive for - confusion and weakness Dermatological ROS: negative for dry skin, pruritus and rash   Blood pressure 145/58, pulse 100, temperature 98.1 F (36.7 C), temperature source Oral, resp. rate 16, height 5\' 10"  (1.778 m), weight 61.916 kg (136 lb 8 oz), SpO2 90.00%.   Neurologic Examination:   Mental Status: Extremely drowsy, believes  he is in [place] tell me date and difficult to get any history from him as he speaks with a whisper and will not participate in conversation. What speech he does have is fluent without aphasia. He looks malnourished and very weak through out. Cranial Nerves: II-Visual fields grossly intact as he can tell me the fingers I am holding up. III/IV/VI-Extraocular movements intact.  Pupils reactive bilaterally. Ptosis not present. V/VII-Face symmetric VIII-grossly intact XII-midline tongue extension Motor: 4/5 bilaterally, able to hold arms antigravity and lift both legs but again, very weak and generally tired.  Normal tone but decreased bulk. Sensory: Pinprick and light touch intact throughout, bilaterally Deep Tendon Reflexes: 1+ and symmetric throughout.     Plantars:      Right:  mute     Left:  mute Cerebellar: Normal finger-to-nose,    Lab Results  Component Value Date/Time   CHOL 108 04/20/2011  4:40 AM    Results for orders placed during the hospital encounter of 10/02/11 (from the past 48 hour(s))  GLUCOSE, CAPILLARY     Status: Abnormal   Collection Time   10/16/11  3:48 PM      Component Value Range Comment   Glucose-Capillary 135 (*) 70 - 99 mg/dL   GLUCOSE, CAPILLARY     Status: Abnormal   Collection Time   10/16/11  8:51 PM      Component Value Range Comment   Glucose-Capillary 224 (*) 70 - 99 mg/dL    Comment 1 Notify RN     CBC     Status: Abnormal   Collection Time   10/17/11  6:10 AM      Component Value Range Comment   WBC 8.2  4.0 - 10.5 K/uL    RBC 3.13 (*) 4.22 - 5.81 MIL/uL    Hemoglobin 9.5 (*) 13.0 -  17.0 g/dL    HCT 27.5 (*) 39.0 - 52.0 %    MCV 87.9  78.0 - 100.0 fL    MCH 30.4  26.0 - 34.0 pg    MCHC 34.5  30.0 - 36.0 g/dL    RDW 14.4  11.5 - 15.5 %    Platelets 197  150 - 400 K/uL   COMPREHENSIVE METABOLIC PANEL     Status: Abnormal   Collection Time   10/17/11  6:10 AM      Component Value Range Comment   Sodium 127 (*) 135 - 145 mEq/L    Potassium 4.3  3.5 - 5.1 mEq/L    Chloride 87 (*) 96 - 112 mEq/L    CO2 30  19 - 32 mEq/L    Glucose, Bld 343 (*) 70 - 99 mg/dL    BUN 91 (*) 6 - 23 mg/dL    Creatinine, Ser 1.89 (*) 0.50 - 1.35 mg/dL    Calcium 10.3  8.4 - 10.5 mg/dL    Total Protein 6.7  6.0 - 8.3 g/dL    Albumin 1.3 (*) 3.5 - 5.2 g/dL    AST 114 (*) 0 - 37 U/L    ALT 69 (*) 0 - 53 U/L    Alkaline Phosphatase 172 (*) 39 - 117 U/L    Total Bilirubin 0.6  0.3 - 1.2 mg/dL    GFR calc non Af Amer 31 (*) >90 mL/min    GFR calc Af Amer 36 (*) >90 mL/min   PHOSPHORUS     Status: Abnormal   Collection Time   10/17/11  6:10 AM  Component Value Range Comment   Phosphorus 5.6 (*) 2.3 - 4.6 mg/dL   GLUCOSE, CAPILLARY     Status: Abnormal   Collection Time   10/17/11  6:19 AM      Component Value Range Comment   Glucose-Capillary 345 (*) 70 - 99 mg/dL   GLUCOSE, CAPILLARY     Status: Abnormal   Collection Time   10/17/11 10:59 AM      Component Value Range Comment   Glucose-Capillary 207 (*) 70 - 99 mg/dL   GLUCOSE, CAPILLARY     Status: Abnormal   Collection Time   10/17/11  4:53 PM      Component Value Range Comment   Glucose-Capillary 226 (*) 70 - 99 mg/dL   URINALYSIS, ROUTINE W REFLEX MICROSCOPIC     Status: Abnormal   Collection Time   10/17/11  5:00 PM      Component Value Range Comment   Color, Urine YELLOW  YELLOW    APPearance CLEAR  CLEAR    Specific Gravity, Urine 1.010  1.005 - 1.030    pH 7.0  5.0 - 8.0    Glucose, UA NEGATIVE  NEGATIVE mg/dL    Hgb urine dipstick TRACE (*) NEGATIVE    Bilirubin Urine NEGATIVE  NEGATIVE    Ketones, ur NEGATIVE   NEGATIVE mg/dL    Protein, ur NEGATIVE  NEGATIVE mg/dL    Urobilinogen, UA 0.2  0.0 - 1.0 mg/dL    Nitrite NEGATIVE  NEGATIVE    Leukocytes, UA NEGATIVE  NEGATIVE   URINE MICROSCOPIC-ADD ON     Status: Normal   Collection Time   10/17/11  5:00 PM      Component Value Range Comment   Squamous Epithelial / LPF RARE  RARE    RBC / HPF 0-2  <3 RBC/hpf   GLUCOSE, CAPILLARY     Status: Abnormal   Collection Time   10/17/11  9:38 PM      Component Value Range Comment   Glucose-Capillary 245 (*) 70 - 99 mg/dL    Comment 1 Notify RN      Comment 2 Documented in Chart     GLUCOSE, CAPILLARY     Status: Abnormal   Collection Time   10/18/11  6:48 AM      Component Value Range Comment   Glucose-Capillary 292 (*) 70 - 99 mg/dL    Comment 1 Notify RN      Comment 2 Documented in Chart     GLUCOSE, CAPILLARY     Status: Abnormal   Collection Time   10/18/11 11:26 AM      Component Value Range Comment   Glucose-Capillary 216 (*) 70 - 99 mg/dL    Comment 1 Notify RN       Ct Head Wo Contrast  10/17/2011  *RADIOLOGY REPORT*  Clinical Data: Altered mental status  CT HEAD WITHOUT CONTRAST  Technique:  Contiguous axial images were obtained from the base of the skull through the vertex without contrast.  Comparison: 04/19/2011  Findings: There is diffuse patchy low density throughout the subcortical and periventricular white matter consistent with chronic small vessel ischemic change.  There is prominence of the sulci and ventricles consistent with brain atrophy.  There is no evidence for acute brain infarct, hemorrhage or mass.  The paranasal sinuses and the mastoid air cells are clear.  The skull is intact.  IMPRESSION:  1.  No acute intracranial abnormalities.  Original Report Authenticated By: Angelita Ingles, M.D.  Mr Brain Wo Contrast  10/17/2011  *RADIOLOGY REPORT*  Clinical Data: Altered mental status.  Weakness.  Decreased level of consciousness.  MRI HEAD WITHOUT CONTRAST  Technique:   Multiplanar, multiecho pulse sequences of the brain and surrounding structures were obtained according to standard protocol without intravenous contrast.  Comparison: Head CT same day.  Head CT 04/19/2011  Findings: Diffusion imaging does not show any acute or subacute infarction.  The brainstem is normal.  There is cerebellar atrophy with a few old small vessel cerebellar infarctions.  The cerebral hemispheres show generalized atrophy with mild chronic small vessel change within the deep white matter.  Ventricles are prominent . No cortical or large vessel territory infarction.  No mass lesion, hemorrhage, hydrocephalus or extra-axial collection.  No pituitary mass.  No inflammatory sinus disease.  There is fluid throughout the mastoid air cells bilaterally.  This could be sterile or indicate mastoiditis.  I do not see a nasopharyngeal lesion on either side.  No skull or skull base lesion.  Though the ventriculomegaly may be on the basis of ex vacuo enlargement, in proportion to the generalized atrophy, when I compare the CT scan today and last December, the ventricles appear a few millimeters larger today.  This raises the possibility of an element of hydrocephalus.  IMPRESSION: Generalized brain atrophy.  Mild chronic small vessel change.  No acute or subacute to intracranial finding.  The ventricles are prominent, but this is in proportion to the degree of generalized atrophy. However, I think the ventricles are slightly larger on the CT scan done today when compared to last December.  This raises the possibility of hydrocephalus.  Fluid throughout both mastoid air cell regions.  These could be sterile mastoid effusions or indicate mastoiditis.  Original Report Authenticated By: Jules Schick, M.D.     Assessment/Plan:    78 YO male with significant metabolic abnormalities since admission.  Patient has LLL PNA, hyponatremia, elevated BUN and creatinine, elevated AST and ALT, glucose ranging from 219 to  most recent of 343. Likely cause of AMS is metabolic encephalopathy.   Recommend: 1) Continue to correct underlying metabolic abnormalities 2) continue ABX for LLL PNA 3) Keep all sedating medications to a minimum. 40 Routine EEG  Etta Quill PA-C [facility] [phone]  10/18/2011, 2:37 PM

## 2011-10-18 NOTE — Progress Notes (Addendum)
S: Barely speaks.  Barely opens eyes O:BP 138/65  Pulse 90  Temp 97.8 F (36.6 C) (Oral)  Resp 18  Ht 5\' 10"  (1.778 m)  Wt 61.916 kg (136 lb 8 oz)  BMI 19.59 kg/m2  SpO2 95%  Intake/Output Summary (Last 24 hours) at 10/18/11 1942 Last data filed at 10/18/11 1900  Gross per 24 hour  Intake      0 ml  Output   1375 ml  Net  -1375 ml   Weight change: -4.763 kg (-10 lb 8 oz) [id number] CVS:RRR Resp:basilar crackles Abd:+BS NTND [id number] edema NEURO:does not answer questions      . aspirin  150 mg Rectal Daily  . capsaicin   Topical BID  . cefTRIAXone (ROCEPHIN)  IV  1 g Intravenous Q24H  . cyanocobalamin  1,000 mcg Intramuscular Q30 days  . darbepoetin (ARANESP) injection - NON-DIALYSIS  100 mcg Subcutaneous Q Mon-1800  . demeclocycline  150 mg Oral Q12H  . furosemide  100 mg Intravenous Q6H  . heparin  5,000 Units Subcutaneous Q8H  . hydrALAZINE  5 mg Intravenous Q8H  . insulin aspart  0-9 Units Subcutaneous TID WC  . insulin glargine  8 Units Subcutaneous QHS  . levalbuterol  0.63 mg Nebulization BID  . pantoprazole (PROTONIX) IV  40 mg Intravenous QHS  . potassium chloride  40 mEq Oral Daily  . prednisoLONE acetate  1 drop Both Eyes Daily  . senna-docusate  1 tablet Oral BID  . sodium chloride  3 mL Intravenous Q12H   Ct Head Wo Contrast  10/17/2011  *RADIOLOGY REPORT*  Clinical Data: Altered mental status  CT HEAD WITHOUT CONTRAST  Technique:  Contiguous axial images were obtained from the base of the skull through the vertex without contrast.  Comparison: 04/19/2011  Findings: There is diffuse patchy low density throughout the subcortical and periventricular white matter consistent with chronic small vessel ischemic change.  There is prominence of the sulci and ventricles consistent with brain atrophy.  There is no evidence for acute brain infarct, hemorrhage or mass.  The paranasal sinuses and the mastoid air cells are clear.  The skull is intact.  IMPRESSION:   1.  No acute intracranial abnormalities.  Original Report Authenticated By: Angelita Ingles, M.D.   Mr Brain Wo Contrast  10/17/2011  *RADIOLOGY REPORT*  Clinical Data: Altered mental status.  Weakness.  Decreased level of consciousness.  MRI HEAD WITHOUT CONTRAST  Technique:  Multiplanar, multiecho pulse sequences of the brain and surrounding structures were obtained according to standard protocol without intravenous contrast.  Comparison: Head CT same day.  Head CT 04/19/2011  Findings: Diffusion imaging does not show any acute or subacute infarction.  The brainstem is normal.  There is cerebellar atrophy with a few old small vessel cerebellar infarctions.  The cerebral hemispheres show generalized atrophy with mild chronic small vessel change within the deep white matter.  Ventricles are prominent . No cortical or large vessel territory infarction.  No mass lesion, hemorrhage, hydrocephalus or extra-axial collection.  No pituitary mass.  No inflammatory sinus disease.  There is fluid throughout the mastoid air cells bilaterally.  This could be sterile or indicate mastoiditis.  I do not see a nasopharyngeal lesion on either side.  No skull or skull base lesion.  Though the ventriculomegaly may be on the basis of ex vacuo enlargement, in proportion to the generalized atrophy, when I compare the CT scan today and last December, the ventricles appear a few millimeters larger today.  This raises the possibility of an element of hydrocephalus.  IMPRESSION: Generalized brain atrophy.  Mild chronic small vessel change.  No acute or subacute to intracranial finding.  The ventricles are prominent, but this is in proportion to the degree of generalized atrophy. However, I think the ventricles are slightly larger on the CT scan done today when compared to last December.  This raises the possibility of hydrocephalus.  Fluid throughout both mastoid air cell regions.  These could be sterile mastoid effusions or indicate  mastoiditis.  Original Report Authenticated By: Jules Schick, M.D.   BMET    Component Value Date/Time   NA 137 10/18/2011 1446   K 4.0 10/18/2011 1446   CL 93* 10/18/2011 1446   CO2 33* 10/18/2011 1446   GLUCOSE 238* 10/18/2011 1446   BUN 98* 10/18/2011 1446   CREATININE 1.99* 10/18/2011 1446   CALCIUM 10.8* 10/18/2011 1446   CALCIUM 8.8 04/29/2011 1005   GFRNONAA 29* 10/18/2011 1446   GFRAA 34* 10/18/2011 1446   CBC    Component Value Date/Time   WBC 9.7 10/18/2011 1446   RBC 3.21* 10/18/2011 1446   HGB 9.8* 10/18/2011 1446   HCT 28.6* 10/18/2011 1446   PLT 213 10/18/2011 1446   MCV 89.1 10/18/2011 1446   MCH 30.5 10/18/2011 1446   MCHC 34.3 10/18/2011 1446   RDW 14.5 10/18/2011 1446   LYMPHSABS 0.5* 10/02/2011 1938   MONOABS 0.0* 10/02/2011 1938   EOSABS 0.0 10/02/2011 1938   BASOSABS 0.0 10/02/2011 1938     Assessment:  1. Hyponatremia, SNA now normal.  Surprising since only 1600 cc recorded for UO yest 2. CKD 3. Mild hypercalcemia prob sec to being bedridden and on Vit D which has been DC'd   Plan: 1.. He is now NPO and unable to take anything by mouth.  Nurse informs me he is now a DNR and all oral meds are to be DC'd.  Since his SNa is now normal, will hold lasix for now and can resume as needed. 2. Recheck SNa in AM   Tommaso Cavitt T

## 2011-10-19 ENCOUNTER — Inpatient Hospital Stay (HOSPITAL_COMMUNITY): Payer: Medicare Other

## 2011-10-19 DIAGNOSIS — R5381 Other malaise: Secondary | ICD-10-CM

## 2011-10-19 DIAGNOSIS — J189 Pneumonia, unspecified organism: Secondary | ICD-10-CM

## 2011-10-19 DIAGNOSIS — D649 Anemia, unspecified: Secondary | ICD-10-CM

## 2011-10-19 DIAGNOSIS — E872 Acidosis: Secondary | ICD-10-CM

## 2011-10-19 DIAGNOSIS — R569 Unspecified convulsions: Secondary | ICD-10-CM

## 2011-10-19 DIAGNOSIS — I10 Essential (primary) hypertension: Secondary | ICD-10-CM

## 2011-10-19 LAB — GLUCOSE, CAPILLARY
Glucose-Capillary: 283 mg/dL — ABNORMAL HIGH (ref 70–99)
Glucose-Capillary: 271 mg/dL — ABNORMAL HIGH (ref 70–99)
Glucose-Capillary: 214 mg/dL — ABNORMAL HIGH (ref 70–99)

## 2011-10-19 LAB — BASIC METABOLIC PANEL
BUN: 100 mg/dL — ABNORMAL HIGH (ref 6–23)
CO2: 31 mEq/L (ref 19–32)
Calcium: 10.9 mg/dL — ABNORMAL HIGH (ref 8.4–10.5)
Creatinine, Ser: 1.85 mg/dL — ABNORMAL HIGH (ref 0.50–1.35)
Glucose, Bld: 271 mg/dL — ABNORMAL HIGH (ref 70–99)

## 2011-10-19 LAB — CBC
HCT: 29.5 % — ABNORMAL LOW (ref 39.0–52.0)
Hemoglobin: 9.8 g/dL — ABNORMAL LOW (ref 13.0–17.0)
MCH: 30.2 pg (ref 26.0–34.0)
MCV: 90.8 fL (ref 78.0–100.0)
RBC: 3.25 MIL/uL — ABNORMAL LOW (ref 4.22–5.81)

## 2011-10-19 LAB — FOLATE: Folate: 10.6 ng/mL

## 2011-10-19 MED ORDER — SODIUM CHLORIDE 0.9 % IV SOLN
INTRAVENOUS | Status: DC
  Administered 2011-10-19: 75 mL/h via INTRAVENOUS

## 2011-10-19 NOTE — Progress Notes (Signed)
[facility] PROGRESS NOTE    SUBJECTIVE   Patient is very lethargic and states " I am exhausted".  He knows he is in the hospital and is able to follow simple commands.  He speaks in a whisper.   OBJECTIVE   Vital signs in last 24 hours: Temp:  [97.6 F (36.4 C)-97.8 F (36.6 C)] 97.6 F (36.4 C) (07/02 0428) Pulse Rate:  [84-98] 98  (07/02 0428) Resp:  [14-18] 14  (07/02 0428) BP: (138-152)/(65-72) 152/67 mmHg (07/02 0428) SpO2:  [90 %-100 %] 100 % (07/02 0428) Weight:  [59.6 kg (131 lb 6.3 oz)] 59.6 kg (131 lb 6.3 oz) (07/02 0428)  Intake/Output from previous day: 07/01 0701 - 07/02 0700 In: 220 [IV Piggyback:220] Out: 2100 [Urine:2100] Intake/Output this shift:   Nutritional status: NPO  Past Medical History  Diagnosis Date  . ANEMIA-NOS 02/17/2007  . DEPRESSION 12/25/2008  . HYPERTENSION 02/17/2007  . GERD 02/17/2007  . OSTEOARTHRITIS 03/04/2010  . DIABETES MELLITUS, TYPE I, CONTROLLED, WITH RETINOPATHY   . Gastroparesis   . BENIGN PROSTATIC HYPERTROPHY   . VITAMIN B12 DEFICIENCY   . BACK PAIN, LUMBAR   . LIVER DISORDER   . PSA, INCREASED   . Hyperlipidemia   . Diabetes mellitus   . Pneumonia   . Anxiety   . Encephalopathy, metabolic     Neurologic Exam:   Mental Status: Alert Very lethargic, oriented X 1.  Speech fluent with a whisper quality.  He is able to follow simple.  Cranial Nerves: II-Visual fields grossly intact. III/IV/VI-Extraocular movements intact.  Pupils reactive bilaterally. Ptosis not present. V/VII-Smile symmetric VIII-grossly intact IX/X-normal gag XI-bilateral shoulder shrug XII-midline tongue extension Motor: 4/5 bilaterally with normal tone and bulk Sensory: Pinprick and light touch intact throughout, bilaterally Deep Tendon Reflexes: 1+ and symmetric throughout.     Plantars: Right: mute Left: mute  Cerebellar: Normal finger-to-nose,    Lab Results: . Results for orders  placed during the hospital encounter of 10/02/11 (from the past 24 hour(s))  GLUCOSE, CAPILLARY     Status: Abnormal   Collection Time   10/18/11 11:26 AM      Component Value Range   Glucose-Capillary 216 (*) 70 - 99 mg/dL   Comment 1 Notify RN    CBC     Status: Abnormal   Collection Time   10/18/11  2:46 PM      Component Value Range   WBC 9.7  4.0 - 10.5 K/uL   RBC 3.21 (*) 4.22 - 5.81 MIL/uL   Hemoglobin 9.8 (*) 13.0 - 17.0 g/dL   HCT 28.6 (*) 39.0 - 52.0 %   MCV 89.1  78.0 - 100.0 fL   MCH 30.5  26.0 - 34.0 pg   MCHC 34.3  30.0 - 36.0 g/dL   RDW 14.5  11.5 - 15.5 %   Platelets 213  150 - 400 K/uL  BASIC METABOLIC PANEL     Status: Abnormal   Collection Time   10/18/11  2:46 PM      Component Value Range   Sodium 137  135 - 145 mEq/L   Potassium 4.0  3.5 - 5.1 mEq/L   Chloride 93 (*) 96 - 112 mEq/L   CO2 33 (*) 19 - 32 mEq/L   Glucose,  Bld 238 (*) 70 - 99 mg/dL   BUN 98 (*) 6 - 23 mg/dL   Creatinine, Ser 1.99 (*) 0.50 - 1.35 mg/dL   Calcium 10.8 (*) 8.4 - 10.5 mg/dL   GFR calc non Af Amer 29 (*) >90 mL/min   GFR calc Af Amer 34 (*) >90 mL/min  GLUCOSE, CAPILLARY     Status: Abnormal   Collection Time   10/18/11  4:12 PM      Component Value Range   Glucose-Capillary 207 (*) 70 - 99 mg/dL   Comment 1 Notify RN    AMMONIA     Status: Normal   Collection Time   10/18/11  4:17 PM      Component Value Range   Ammonia 13  11 - 60 umol/L  MAGNESIUM     Status: Normal   Collection Time   10/18/11  4:21 PM      Component Value Range   Magnesium 2.3  1.5 - 2.5 mg/dL  VITAMIN B12     Status: Abnormal   Collection Time   10/18/11  4:21 PM      Component Value Range   Vitamin B-12 1644 (*) 211 - 911 pg/mL  FOLATE     Status: Normal   Collection Time   10/18/11  4:21 PM      Component Value Range   Folate 10.6    GLUCOSE, CAPILLARY     Status: Abnormal   Collection Time   10/18/11  8:55 PM      Component Value Range   Glucose-Capillary 189 (*) 70 - 99 mg/dL   Comment 1 Notify RN      Comment 2 Documented in Chart    CBC     Status: Abnormal   Collection Time   10/19/11  5:34 AM      Component Value Range   WBC 8.9  4.0 - 10.5 K/uL   RBC 3.25 (*) 4.22 - 5.81 MIL/uL   Hemoglobin 9.8 (*) 13.0 - 17.0 g/dL   HCT 29.5 (*) 39.0 - 52.0 %   MCV 90.8  78.0 - 100.0 fL   MCH 30.2  26.0 - 34.0 pg   MCHC 33.2  30.0 - 36.0 g/dL   RDW 14.8  11.5 - 15.5 %   Platelets 199  150 - 400 K/uL  BASIC METABOLIC PANEL     Status: Abnormal   Collection Time   10/19/11  5:34 AM      Component Value Range   Sodium 142  135 - 145 mEq/L   Potassium 4.1  3.5 - 5.1 mEq/L   Chloride 98  96 - 112 mEq/L   CO2 31  19 - 32 mEq/L   Glucose, Bld 271 (*) 70 - 99 mg/dL   BUN 100 (*) 6 - 23 mg/dL   Creatinine, Ser 1.85 (*) 0.50 - 1.35 mg/dL   Calcium 10.9 (*) 8.4 - 10.5 mg/dL   GFR calc non Af Amer 32 (*) >90 mL/min   GFR calc Af Amer 37 (*) >90 mL/min   Lipid Panel No results found for this basename: CHOL,TRIG,HDL,CHOLHDL,VLDL,LDLCALC in the last 72 hours  Studies/Results: Ct Head Wo Contrast  10/17/2011  *RADIOLOGY REPORT*  Clinical Data: Altered mental status  CT HEAD WITHOUT CONTRAST  Technique:  Contiguous axial images were obtained from the base of the skull through the vertex without contrast.  Comparison: 04/19/2011  Findings: There is diffuse patchy low density throughout the subcortical and periventricular white matter consistent with  chronic small vessel ischemic change.  There is prominence of the sulci and ventricles consistent with brain atrophy.  There is no evidence for acute brain infarct, hemorrhage or mass.  The paranasal sinuses and the mastoid air cells are clear.  The skull is intact.  IMPRESSION:  1.  No acute intracranial abnormalities.  Original Report Authenticated By: Angelita Ingles, M.D.   Mr Brain Wo Contrast  10/17/2011  *RADIOLOGY REPORT*  Clinical Data: Altered mental status.  Weakness.  Decreased level of consciousness.  MRI HEAD WITHOUT CONTRAST  Technique:   Multiplanar, multiecho pulse sequences of the brain and surrounding structures were obtained according to standard protocol without intravenous contrast.  Comparison: Head CT same day.  Head CT 04/19/2011  Findings: Diffusion imaging does not show any acute or subacute infarction.  The brainstem is normal.  There is cerebellar atrophy with a few old small vessel cerebellar infarctions.  The cerebral hemispheres show generalized atrophy with mild chronic small vessel change within the deep white matter.  Ventricles are prominent . No cortical or large vessel territory infarction.  No mass lesion, hemorrhage, hydrocephalus or extra-axial collection.  No pituitary mass.  No inflammatory sinus disease.  There is fluid throughout the mastoid air cells bilaterally.  This could be sterile or indicate mastoiditis.  I do not see a nasopharyngeal lesion on either side.  No skull or skull base lesion.  Though the ventriculomegaly may be on the basis of ex vacuo enlargement, in proportion to the generalized atrophy, when I compare the CT scan today and last December, the ventricles appear a few millimeters larger today.  This raises the possibility of an element of hydrocephalus.  IMPRESSION: Generalized brain atrophy.  Mild chronic small vessel change.  No acute or subacute to intracranial finding.  The ventricles are prominent, but this is in proportion to the degree of generalized atrophy. However, I think the ventricles are slightly larger on the CT scan done today when compared to last December.  This raises the possibility of hydrocephalus.  Fluid throughout both mastoid air cell regions.  These could be sterile mastoid effusions or indicate mastoiditis.  Original Report Authenticated By: Jules Schick, M.D.    Medications:     Scheduled:   . aspirin  300 mg Rectal Daily  . capsaicin   Topical BID  . cefTRIAXone (ROCEPHIN)  IV  1 g Intravenous Q24H  . cyanocobalamin  1,000 mcg Intramuscular Q30 days  .  darbepoetin (ARANESP) injection - NON-DIALYSIS  100 mcg Subcutaneous Q Mon-1800  . heparin  5,000 Units Subcutaneous Q8H  . hydrALAZINE  5 mg Intravenous Q8H  . insulin aspart  0-9 Units Subcutaneous TID WC  . insulin glargine  8 Units Subcutaneous QHS  . levalbuterol  0.63 mg Nebulization BID  . pantoprazole (PROTONIX) IV  40 mg Intravenous QHS  . prednisoLONE acetate  1 drop Both Eyes Daily  . sodium chloride  3 mL Intravenous Q12H  . DISCONTD: aspirin  150 mg Rectal Daily  . DISCONTD: demeclocycline  150 mg Oral Q12H  . DISCONTD: furosemide  100 mg Intravenous Q6H  . DISCONTD: potassium chloride  40 mEq Oral Daily  . DISCONTD: senna-docusate  1 tablet Oral BID    Assessment/Plan:   78 YO male with significant metabolic abnormalities since admission. Patient has LLL PNA, hyponatremia, elevated BUN and creatinine, elevated AST and ALT, and glucose.  Recent ammonia and magnesium were normal. Likely cause of AMS is metabolic encephalopathy.   Recommend:  1) Continue to correct underlying metabolic abnormalities including glucose, BUN/creatinine. 2) continue ABX for LLL PNA  3) Keep all sedating medications to a minimum.  40 Routine EEG--To be done today    Etta Quill PA-C [facility] [phone]  10/19/2011, 8:30 AM

## 2011-10-19 NOTE — Progress Notes (Signed)
SLP Cancellation Note  Treatment cancelled today due to medical issues with patient which prohibited therapy. Patient aroused briefly with tactile stimulation but not alert enough for po trials or further assessment of swallow. Will f/u 7/3.   Gabriel Rainwater MA, CCC-SLP [phone]   Eric [name] 10/19/2011, 1:39 PM

## 2011-10-19 NOTE — Progress Notes (Signed)
Physical Therapy Treatment Patient Details Name: Eric [name] MRN: [id number] DOB: [birth year] Today's Date: 10/19/2011 Time: [id number] PT Time Calculation (min): 14 min  PT Assessment / Plan / Recommendation Comments on Treatment Session  Pt not progressing with mobility as expected. Will decrease frequency to 2x/wk and continue PT interventions.     Follow Up Recommendations  Skilled nursing facility    Barriers to Discharge        Equipment Recommendations  Defer to next venue    Recommendations for Other Services    Frequency Min 2X/week   Plan Discharge plan remains appropriate;Frequency remains appropriate    Precautions / Restrictions Precautions Precautions: Fall Restrictions Weight Bearing Restrictions: No   Pertinent Vitals/Pain No pain    Mobility  Transfers Transfers: Sit to Stand;Stand to Sit Sit to Stand: 2: Max assist;With upper extremity assist;From bed Stand to Sit: 2: Max assist;To chair/3-in-1;With upper extremity assist Details for Transfer Assistance: Continuous step by step cueing.  Pt had difficulty standing with RW. Manual facilitation to initiate pivot. Pt presents with flexed trunk posture posture which increased as pt fatigued.  Max assist to control pt's descent to chair.  Ambulation/Gait Ambulation/Gait Assistance: 1: +2 Total assist Ambulation/Gait: Patient Percentage: 30% Ambulation Distance (Feet): 8 Feet Assistive device: Rolling walker Ambulation/Gait Assistance Details: Pt required less assistance to initiate gait but continues to struggle with supporting his wt through his LE.  Knees flexed and trunk flexed.  Gait Pattern: Step-to pattern;Decreased step length - right;Decreased step length - left;Decreased hip/knee flexion - right;Decreased hip/knee flexion - left;Trunk flexed Stairs: No Wheelchair Mobility Wheelchair Mobility: No    Exercises     PT Diagnosis:    PT Problem List:   PT Treatment Interventions:     PT Goals Acute  Rehab PT Goals PT Goal Formulation: Patient unable to participate in goal setting Time For Goal Achievement: 11/01/11 Potential to Achieve Goals: Fair Pt will go Supine/Side to Sit: with min assist;with HOB 0 degrees Pt will go Sit to Stand: with min assist;with upper extremity assist PT Goal: Sit to Stand - Progress: Not progressing Pt will go Stand to Sit: with min assist;with upper extremity assist PT Goal: Stand to Sit - Progress: Not progressing Pt will Transfer Bed to Chair/Chair to Bed: with min assist PT Transfer Goal: Bed to Chair/Chair to Bed - Progress: Not progressing Pt will Ambulate: 1 - 15 feet;with mod assist PT Goal: Ambulate - Progress: Not met Pt will Go Up / Down Stairs: 6-9 stairs;with modified independence;with rail(s);with cane PT Goal: Up/Down Stairs - Progress: Discontinued (comment) (no longer appropriate. )  Visit Information  Last PT Received On: 10/19/11    Subjective Data      Cognition  Overall Cognitive Status: Difficult to assess Area of Impairment: Attention;Memory;Awareness of errors;Awareness of deficits;Problem solving;Executive functioning Difficult to assess due to: Level of arousal Arousal/Alertness: Lethargic Orientation Level: Disoriented to;Place;Time Behavior During Session: Lethargic Current Attention Level: Focused Attention - Other Comments: also limited by lethargy making it hard to stay on task Memory: Decreased recall of precautions Memory Deficits: Pt with poor recall of home set up. Awareness of Errors: Assistance required to identify errors made;Assistance required to correct errors made Awareness of Deficits: Pt so lethergic it was difficult to fully assess awareness. Problem Solving: Poor Executive Functioning: poor Cognition - Other Comments: Pt. with overall decline in cognitive status since initial evaluation    Balance  Balance Balance Assessed: No  End of Session PT - End of Session  Equipment Utilized During  Treatment: Gait belt;Oxygen Activity Tolerance: Patient limited by fatigue Patient left: in chair;with call bell/phone within reach Nurse Communication: Mobility status;Other (comment)   GP     Keilee Denman 10/19/2011, 5:41 PM Kaleel Schmieder L. Jeronica Stlouis DPT [phone]

## 2011-10-19 NOTE — Progress Notes (Signed)
Occupational Therapy Treatment Patient Details Name: Eric [name] MRN: [id number] DOB: 1923/11/30 Today's Date: 10/19/2011 Time: [id number] OT Time Calculation (min): 23 min  OT Assessment / Plan / Recommendation Comments on Treatment Session Pt. with decreased arousal and decreased activity tolerance.    Follow Up Recommendations  Skilled nursing facility       Equipment Recommendations  Defer to next venue       Frequency Min 2X/week   Plan Discharge plan remains appropriate    Precautions / Restrictions Precautions Precautions: Fall Restrictions Weight Bearing Restrictions: No       ADL  Grooming: Performed;Wash/dry face;Teeth care;Minimal assistance Where Assessed - Grooming: Unsupported sitting ADL Comments: Pt. with decreased arousal and frequently with eyes closed during session. Pt. desired to get EOB and to chair, however after sitting up pt. began to cough up small food particles and provided assist with oral care to prevent aspiration. Pt. provided with hand over hand use of sponge to complete oral care due to pt. unable to maintain hold of slim tubing with rt. hand. Pt. sat EOB with min guard ~10mins and due to fatigue demonstrated by increasingly kyphotic posture and inability to keep eyes open, pt., returned to supine position.       OT Goals Acute Rehab OT Goals OT Goal Formulation: With patient Time For Goal Achievement: 10/28/11 Potential to Achieve Goals: Fair ADL Goals Pt Will Perform Grooming: with set-up;with supervision;Standing at sink ADL Goal: Grooming - Progress: Not progressing Additional ADL Goal #1: Pt. will recall 3 energy conservation techniques with ADLs ADL Goal: Additional Goal #1 - Progress: Not progressing  Visit Information  Last OT Received On: 10/19/11 Assistance Needed: +1          Cognition  Overall Cognitive Status: Difficult to assess Area of Impairment: Attention;Memory;Awareness of errors;Awareness of deficits;Problem  solving;Executive functioning Difficult to assess due to: Level of arousal Arousal/Alertness: Lethargic Orientation Level: Disoriented to;Place;Time Behavior During Session: Lethargic Current Attention Level: Focused Memory: Decreased recall of precautions Awareness of Errors: Assistance required to identify errors made;Assistance required to correct errors made Awareness of Deficits: Pt so lethergic it was difficult to fully assess awareness. Problem Solving: Poor Executive Functioning: poor Cognition - Other Comments: Pt. with overall decline in cognitive status since initial evaluation    Mobility Bed Mobility Bed Mobility: Rolling Left;Left Sidelying to Sit;Sitting - Scoot to Edge of Bed Rolling Left: 3: Mod assist Left Sidelying to Sit: 3: Mod assist Sitting - Scoot to Edge of Bed: 2: Max assist Transfers Transfers: Not assessed         End of Session  Pt. Left in supine position with nurse tech present       Kirby Funk Pager [phone] 10/19/2011, 9:27 AM

## 2011-10-19 NOTE — Progress Notes (Signed)
INITIAL ADULT NUTRITION ASSESSMENT Date: 10/19/2011   Time: 12:38 PM Reason for Assessment: Low Braden score  INTERVENTION: 1. Recommend placing Panda tube and initiation of Jevity 1.2 at 20 ml/hr and increase by 10 ml q 8 hr to goal rate of 50 ml/hr.  2. This will provide 1440 kcal, 67 gm protein, 968 ml free water 3. Recommend 200 ml free water flush BID to meet hydration needs with this EN regimen.  4. IF EN not initiation recommend other means of hydration 5. RD will continue to follow  ASSESSMENT: Male 78 y.o.  Dx: Bilateral pedal edema  Hx:  Past Medical History  Diagnosis Date  . ANEMIA-NOS 02/17/2007  . DEPRESSION 12/25/2008  . HYPERTENSION 02/17/2007  . GERD 02/17/2007  . OSTEOARTHRITIS 03/04/2010  . DIABETES MELLITUS, TYPE I, CONTROLLED, WITH RETINOPATHY   . Gastroparesis   . BENIGN PROSTATIC HYPERTROPHY   . VITAMIN B12 DEFICIENCY   . BACK PAIN, LUMBAR   . LIVER DISORDER   . PSA, INCREASED   . Hyperlipidemia   . Diabetes mellitus   . Pneumonia   . Anxiety   . Encephalopathy, metabolic     Related Meds:     . aspirin  300 mg Rectal Daily  . capsaicin   Topical BID  . cefTRIAXone (ROCEPHIN)  IV  1 g Intravenous Q24H  . cyanocobalamin  1,000 mcg Intramuscular Q30 days  . darbepoetin (ARANESP) injection - NON-DIALYSIS  100 mcg Subcutaneous Q Mon-1800  . heparin  5,000 Units Subcutaneous Q8H  . hydrALAZINE  5 mg Intravenous Q8H  . insulin aspart  0-9 Units Subcutaneous TID WC  . insulin glargine  8 Units Subcutaneous QHS  . levalbuterol  0.63 mg Nebulization BID  . pantoprazole (PROTONIX) IV  40 mg Intravenous QHS  . prednisoLONE acetate  1 drop Both Eyes Daily  . sodium chloride  3 mL Intravenous Q12H  . DISCONTD: aspirin  150 mg Rectal Daily  . DISCONTD: demeclocycline  150 mg Oral Q12H  . DISCONTD: furosemide  100 mg Intravenous Q6H  . DISCONTD: potassium chloride  40 mEq Oral Daily  . DISCONTD: senna-docusate  1 tablet Oral BID     Ht: 5\' 10"  (177.8  cm)  Wt: 131 lb 6.3 oz (59.6 kg) (bedscale)  Ideal Wt: 75.5 kg  % Ideal Wt: 79%  Usual Wt:  Wt Readings from Last 10 Encounters:  10/19/11 131 lb 6.3 oz (59.6 kg)  10/01/11 158 lb (71.668 kg)  07/29/11 158 lb 1.9 oz (71.723 kg)  06/11/11 157 lb 3.2 oz (71.305 kg)  06/01/11 155 lb (70.308 kg)  04/29/11 153 lb 12.8 oz (69.763 kg)  04/24/11 155 lb 5.6 oz (70.465 kg)  04/24/11 155 lb 5.6 oz (70.465 kg)  04/16/11 155 lb (70.308 kg)  03/30/11 154 lb 6.4 oz (70.035 kg)    % Usual Wt: admission weight was stable with previous weights 100%  Body mass index is 18.85 kg/(m^2). pt is underweight   Food/Nutrition Related Hx: per son, pt with normal intake and stable weight PTA.   Labs: . CMP     Component Value Date/Time   NA 142 10/19/2011 0534   K 4.1 10/19/2011 0534   CL 98 10/19/2011 0534   CO2 31 10/19/2011 0534   GLUCOSE 271* 10/19/2011 0534   BUN 100* 10/19/2011 0534   CREATININE 1.85* 10/19/2011 0534   CALCIUM 10.9* 10/19/2011 0534   CALCIUM 8.8 04/29/2011 1005   PROT 6.7 10/17/2011 0610   ALBUMIN 1.3* 10/17/2011 [id number]  AST 114* 10/17/2011 0610   ALT 69* 10/17/2011 0610   ALKPHOS 172* 10/17/2011 0610   BILITOT 0.6 10/17/2011 0610   GFRNONAA 32* 10/19/2011 0534   GFRAA 37* 10/19/2011 0534     Intake/Output Summary (Last 24 hours) at 10/19/11 1242 Last data filed at 10/19/11 1220  Gross per 24 hour  Intake    220 ml  Output   1875 ml  Net  -1655 ml     Diet Order: NPO  Supplements/Tube Feeding: none  IVF:   none  Estimated Nutritional Needs:   Kcal: 1400-1600 Protein: 60-70 gm  Fluid:  1.4-1.6 L    RD was pulled to pt for a Low Braden score. RD reviewed chart. Pt with increased AMS throughout admission. It was noted pt was pocketing foods and SLP eval was ordered. 6/30 recommended NPO, reevaluated on 7/1 by SLP who again recommended NPO.  RD attempted to speak with pt, woke to name call but did not respond. Spoke with pt's son who confirmed that pt was eating normal PTA and  weight was stable.  Pt has lost weight since admission, 25 lbs down now (16% weight loss). Pt NPO now for 3 days, also with no IV fluids at this time. Per documentation pt with <50% intake for >5 days. Pt meets criteria for severe malnutrition in the context of acute illness or injury 2/2 weight loss and intake as above.  Pt at risk for refeeding given poor intake.   Recommend initiation of EN. Pt is not currently meeting nutrition or hydration needs.   RD spoke with MD who stated pt possible comfort care.   NUTRITION DIAGNOSIS: -Inadequate oral intake (NI-2.1).  Status: Ongoing  RELATED TO: inability to eat  AS EVIDENCE BY: NPO  MONITORING/EVALUATION(Goals): Goal: initiation of EN  Monitor: TF initiation, weight, labs  EDUCATION NEEDS: -No education needs identified at this time   DOCUMENTATION CODES Per approved criteria  -Severe malnutrition in the context of acute illness or injury    Orson Slick RD, LDN Pager [phone] After Hours pager [phone]

## 2011-10-19 NOTE — Progress Notes (Signed)
Assessment:  1. Hyponatremia, sNA now normal.  2. CKD with sever azotemia due to diuretic treaTMENT 3. Mild hypercalcemia prob sec to being bedridden and on Vit D which has been [facility]- IVF  Subjective: Interval History: No voiced complaints  Objective: Vital signs in last 24 hours: Temp:  [97.6 F (36.4 C)-97.8 F (36.6 C)] 97.6 F (36.4 C) (07/02 0428) Pulse Rate:  [84-98] 98  (07/02 0428) Resp:  [14-18] 14  (07/02 0428) BP: (138-152)/(65-72) 152/67 mmHg (07/02 0428) SpO2:  [95 %-100 %] 95 % (07/02 0842) Weight:  [59.6 kg (131 lb 6.3 oz)] 59.6 kg (131 lb 6.3 oz) (07/02 0428) Weight change: -2.316 kg (-5 lb 1.7 oz)  Intake/Output from previous day: 07/01 0701 - 07/02 0700 In: 220 [IV Piggyback:220] Out: 2100 [Urine:2100] Intake/Output this shift: Total I/O In: 0  Out: 750 [Urine:750]  PE Weak male HEENT sunken features Lung clear Cor RRR Abd Soft No edema  Lab Results:  Basename 10/19/11 0534 10/18/11 1446  WBC 8.9 9.7  HGB 9.8* 9.8*  HCT 29.5* 28.6*  PLT 199 213   BMET:  Basename 10/19/11 0534 10/18/11 1446  NA 142 137  K 4.1 4.0  CL 98 93*  CO2 31 33*  GLUCOSE 271* 238*  BUN 100* 98*  CREATININE 1.85* 1.99*  CALCIUM 10.9* 10.8*   No results found for this basename: PTH:2 in the last 72 hours Iron Studies: No results found for this basename: IRON,TIBC,TRANSFERRIN,FERRITIN in the last 72 hours Studies/Results: Mr Brain Wo Contrast  10/17/2011  *RADIOLOGY REPORT*  Clinical Data: Altered mental status.  Weakness.  Decreased level of consciousness.  MRI HEAD WITHOUT CONTRAST  Technique:  Multiplanar, multiecho pulse sequences of the brain and surrounding structures were obtained according to standard protocol without intravenous contrast.  Comparison: Head CT same day.  Head CT 04/19/2011  Findings: Diffusion imaging does not show any acute or subacute infarction.  The brainstem is normal.  There is cerebellar atrophy with a few old small vessel  cerebellar infarctions.  The cerebral hemispheres show generalized atrophy with mild chronic small vessel change within the deep white matter.  Ventricles are prominent . No cortical or large vessel territory infarction.  No mass lesion, hemorrhage, hydrocephalus or extra-axial collection.  No pituitary mass.  No inflammatory sinus disease.  There is fluid throughout the mastoid air cells bilaterally.  This could be sterile or indicate mastoiditis.  I do not see a nasopharyngeal lesion on either side.  No skull or skull base lesion.  Though the ventriculomegaly may be on the basis of ex vacuo enlargement, in proportion to the generalized atrophy, when I compare the CT scan today and last December, the ventricles appear a few millimeters larger today.  This raises the possibility of an element of hydrocephalus.  IMPRESSION: Generalized brain atrophy.  Mild chronic small vessel change.  No acute or subacute to intracranial finding.  The ventricles are prominent, but this is in proportion to the degree of generalized atrophy. However, I think the ventricles are slightly larger on the CT scan done today when compared to last December.  This raises the possibility of hydrocephalus.  Fluid throughout both mastoid air cell regions.  These could be sterile mastoid effusions or indicate mastoiditis.  Original Report Authenticated By: Jules Schick, M.D.    Scheduled:   . aspirin  300 mg Rectal Daily  . capsaicin   Topical BID  . cefTRIAXone (ROCEPHIN)  IV  1 g Intravenous Q24H  . cyanocobalamin  1,000 mcg Intramuscular Q30 days  . darbepoetin (ARANESP) injection - NON-DIALYSIS  100 mcg Subcutaneous Q Mon-1800  . heparin  5,000 Units Subcutaneous Q8H  . hydrALAZINE  5 mg Intravenous Q8H  . insulin aspart  0-9 Units Subcutaneous TID WC  . insulin glargine  8 Units Subcutaneous QHS  . levalbuterol  0.63 mg Nebulization BID  . pantoprazole (PROTONIX) IV  40 mg Intravenous QHS  . prednisoLONE acetate  1 drop Both  Eyes Daily  . sodium chloride  3 mL Intravenous Q12H  . DISCONTD: aspirin  150 mg Rectal Daily  . DISCONTD: demeclocycline  150 mg Oral Q12H  . DISCONTD: furosemide  100 mg Intravenous Q6H  . DISCONTD: potassium chloride  40 mEq Oral Daily  . DISCONTD: senna-docusate  1 tablet Oral BID       LOS: 17 days   Eric [name] C 10/19/2011,2:05 PM

## 2011-10-19 NOTE — Progress Notes (Signed)
Brief Summary :Eric [name] is an 78 y.o. male with history of hypertension, diabetes, chronic renal insufficiency, hypertension, elevated PSA, lived alone and was actively working, presented to the urgent care clinic complaining of bilateral pedal edema along with been feeling weak.  Evaluation showed hyponatremia with serum sodium of 122, creatinine of 1.5 He was felt to have hyponatremia induced by SIADH and hypervolemia, His volume status is being managed by Renal for last 8 days, he was aggressively diuresed with large doses IV lasix for several days, then demeclocycline was added to his regimen, He was also noted to have pneumonia on LLL consolidation on CXR and CT abd, hence treated with Levaquin. Patient over the past few days as progressively declined. All workup including CT and  head MRI has been negative. Consulted neurology. EEG ordered and is pending. Patient is now not eating and pocketing food. He is extremely weak and not able to swallow. This was discussed with son. Patient was made DO NOT RESUSCITATE. Placed a palliative care consult.   Subjective: Patient is very sleepy today.  Patient is extremely weak. Sisters at the bedside. Objective: Vital signs in last 24 hours: Filed Vitals:   10/15/11 1451 10/15/11 2046 10/16/11 0526 10/16/11 1410  BP: 170/66 161/73 171/70 149/62  Pulse: 104 106 102 90  Temp: 98.4 F (36.9 C) 98.9 F (37.2 C) 98.2 F (36.8 C) 98 F (36.7 C)  TempSrc: Oral Oral Oral Oral  Resp: 18 16 18 19   Height:      Weight:   69.037 kg (152 lb 3.2 oz)   SpO2: 96% 95% 100% 98%   Patient Vitals for the past 24 hrs:  BP Temp Temp src Pulse Resp SpO2 Weight  10/16/11 1410 149/62 mmHg 98 F (36.7 C) Oral 90  19  98 % -  10/16/11 0526 171/70 mmHg 98.2 F (36.8 C) Oral 102  18  100 % 69.037 kg (152 lb 3.2 oz)  10/15/11 2046 161/73 mmHg 98.9 F (37.2 C) Oral 106  16  95 % -    Weight change: 0.181 kg (6.4 oz)  Intake/Output Summary (Last 24 hours) at  10/16/11 1850 Last data filed at 10/16/11 1713  Gross per 24 hour  Intake    241 ml  Output   2050 ml  Net  -1809 ml    Physical Exam: General:answers questions appropriately once aroused, oriented x2, no acute distress. diminished muscle mass  Heart regular rate and rhythm Lungs:clear Abdomen: Soft, Nontender, Nondistended, +bowel sounds. Extremities: Trace edema   Lab Results: Basic Metabolic Panel:  Lab [id number] 10/15/11 0644 10/14/11 0500 10/13/11 0615 10/12/11 0605  NA 128* 127* 122* 121* 119*  K 4.1 3.6 3.3* 4.0 4.0  CL 89* 88* 84* 83* 83*  CO2 28 26 23 21 21   GLUCOSE 227* 152* 119* 89 157*  BUN 97* 91* 91* 93* 86*  CREATININE 2.20* 2.32* 2.50* 2.58* 2.42*  CALCIUM 9.5 8.8 8.7 8.8 8.3*  MG -- -- -- -- --  PHOS 4.3 5.3* 6.3* 5.7* --   Liver Function Tests:  Lab 10/16/11 0615 10/15/11 0644 10/14/11 0500 10/13/11 0615  AST 76* 90* -- --  ALT 48 40 -- --  ALKPHOS 195* 170* -- --  BILITOT 0.4 0.6 -- --  PROT 6.2 5.9* -- --  ALBUMIN 1.2* 1.2* 1.4* 1.4*   CBC:  Lab 10/16/11 0615 10/15/11 0644 10/14/11 0500 10/12/11 0605 10/11/11 0833  WBC 8.8 8.7 10.0 8.4 9.0  NEUTROABS -- -- -- -- --  HGB 8.3* 8.4* 9.3* 8.8* 8.4*  HCT 23.4* 22.8* 25.7* 23.9* 22.7*  MCV 86.0 85.4 84.5 86.0 84.1  PLT 199 217 248 256 242   Cardiac Enzymes: No results found for this basename: CKTOTAL:5,CKMB:5,CKMBINDEX:5,TROPONINI:5 in the last 168 hours BNP (last 3 results)  Basename 10/02/11 1938 10/01/11 1036 03/30/11 1055  PROBNP 396.1 1962.0* 27.0   CBG:  Lab 10/16/11 1548 10/16/11 1102 10/16/11 0625 10/16/11 0258 10/15/11 2109  GLUCAP 135* 93 216* 298* 377*   CBG (last 3)   Basename 10/16/11 1548 10/16/11 1102 10/16/11 0625  GLUCAP 135* 93 216*     Medications: I have reviewed the patient's current medications. Scheduled Meds:    . aspirin EC  81 mg Oral Daily  . atorvastatin  20 mg Oral Daily  . calcitRIOL  0.25 mcg Oral Daily  . capsaicin   Topical BID  .  cyanocobalamin  1,000 mcg Intramuscular Q30 days  . darbepoetin (ARANESP) injection - NON-DIALYSIS  100 mcg Subcutaneous Q Mon-1800  . demeclocycline  150 mg Oral Q12H  . furosemide  100 mg Intravenous Q6H  . heparin  5,000 Units Subcutaneous Q8H  . hydrALAZINE  50 mg Oral BID  . insulin aspart  0-9 Units Subcutaneous TID WC  . insulin aspart  10 Units Subcutaneous Once  . insulin aspart protamine-insulin aspart  20 Units Subcutaneous BID WC  . isosorbide mononitrate  10 mg Oral BID WC  . levalbuterol  0.63 mg Nebulization BID  . pantoprazole  40 mg Oral Q1200  . potassium chloride  40 mEq Oral Daily  . prednisoLONE acetate  1 drop Both Eyes Daily  . senna-docusate  1 tablet Oral BID  . sodium chloride  3 mL Intravenous Q12H   Continuous Infusions:    . sodium chloride 75 mL/hr (10/19/11 1433)   PRN Meds:.acetaminophen, lactulose, levalbuterol, polyethylene glycol, traMADol  Assessment/Plan: Bilateral lower extremity edema likely due to decompensated acute on chronic diastolic heart failure and CKD 2-D echo 01/13 ejection fraction 0000000, Grade 1 diastolic dysfunction, no valvular abnormalities Renal continues to manage volume status.   Metabolic encephalopathy: At the beginning the patient had confusion probably from Pneumonia, and hyponatremia. Head CT and MRI both negative for acute stroke . Consulted neurology to assist with evaluation.  Pneumonia/ Left lower lobe consolidation with effusion on CXR and CT abd pelvis: started IV levaquin 6/19, repeat CXR with increasing L lung consolidation, continue xopenex nebs Clinically improving, afebrile now, WBC normalized, oxygen requirements improved as well Completed a course of 10 days of antibiotic with Levaquin on 6/28   Hyponatremia:  Multifactorial, Initially volume overload, SIADH Was aggressively diuresed from admission On Diuretics and Demeclocycline per Renal  question of depressed LV function per nephrology. 2-D echo  pending.  ARF on CKD2: Renal USG with normal parenchyma and no Hydronephrosis, creatinine was 1.2 on admission SPEP/UPEP pending, ECHO as noted above Suspect secondary to diuresis /ARB, ARB stopped , continue to monitor Per nephrology   DM (diabetes mellitus), type 1, uncontrolled w/ophthalmic complication Continue SSI.  Added Lantus  Anemia  Maybe due to CKD, acute illness, blood draws   Generalized weakness Continue PT/OT recommending SNF.   HTN: Stable,   Dysphagia Patient was noted to be pocketing food. Speech was consulted. Recommendation is n.p.o. Will continue n.p.o. and monitor mental status.  Prophylaxis Subcutaneous heparin.  Tachycardia Seem to be improving. Will add beta blocker if heart rate remains above 100.   will defer to nephrology if  needed to be  added before then  Code status DNR  Disposition Poor prognosis   LOS: 14 days  Eric [name], Eric [place], MD 10/18/2011, 6:50 PM Pager: [phone]

## 2011-10-19 NOTE — Progress Notes (Signed)
Routine EEG completed.  

## 2011-10-20 ENCOUNTER — Inpatient Hospital Stay (HOSPITAL_COMMUNITY): Payer: Medicare Other

## 2011-10-20 DIAGNOSIS — R609 Edema, unspecified: Secondary | ICD-10-CM

## 2011-10-20 DIAGNOSIS — R4182 Altered mental status, unspecified: Secondary | ICD-10-CM

## 2011-10-20 DIAGNOSIS — G9341 Metabolic encephalopathy: Secondary | ICD-10-CM

## 2011-10-20 LAB — GLUCOSE, CAPILLARY
Glucose-Capillary: 236 mg/dL — ABNORMAL HIGH (ref 70–99)
Glucose-Capillary: 219 mg/dL — ABNORMAL HIGH (ref 70–99)

## 2011-10-20 LAB — CBC
Platelets: 190 10*3/uL (ref 150–400)
RBC: 3.5 MIL/uL — ABNORMAL LOW (ref 4.22–5.81)
RDW: 15 % (ref 11.5–15.5)
WBC: 8.8 10*3/uL (ref 4.0–10.5)

## 2011-10-20 LAB — BASIC METABOLIC PANEL
CO2: 32 mEq/L (ref 19–32)
Calcium: 10.5 mg/dL (ref 8.4–10.5)
Chloride: 103 mEq/L (ref 96–112)
GFR calc Af Amer: 45 mL/min — ABNORMAL LOW (ref >90)
Sodium: 145 mEq/L (ref 135–145)

## 2011-10-20 MED ORDER — INSULIN ASPART 100 UNIT/ML ~~LOC~~ SOLN
0.0000 [IU] | Freq: Every day | SUBCUTANEOUS | Status: DC
Start: 2011-10-20 — End: 2011-10-21

## 2011-10-20 MED ORDER — JEVITY 1.2 CAL PO LIQD
1000.0000 mL | ORAL | Status: DC
  Filled 2011-10-20: qty 1000

## 2011-10-20 MED ORDER — INSULIN ASPART 100 UNIT/ML ~~LOC~~ SOLN
0.0000 [IU] | Freq: Three times a day (TID) | SUBCUTANEOUS | Status: DC
  Administered 2011-10-20: 3 [IU] via SUBCUTANEOUS
  Administered 2011-10-21 (×2): 2 [IU] via SUBCUTANEOUS

## 2011-10-20 MED ORDER — POTASSIUM CHLORIDE IN NACL 20-0.45 MEQ/L-% IV SOLN
INTRAVENOUS | Status: DC
  Administered 2011-10-20: 13:00:00 via INTRAVENOUS
  Administered 2011-10-21: 75 mL via INTRAVENOUS
  Filled 2011-10-20 (×3): qty 1000

## 2011-10-20 MED ORDER — INSULIN GLARGINE 100 UNIT/ML ~~LOC~~ SOLN
15.0000 [IU] | Freq: Every day | SUBCUTANEOUS | Status: DC
  Administered 2011-10-20 – 2011-10-25 (×6): 15 [IU] via SUBCUTANEOUS

## 2011-10-20 NOTE — Progress Notes (Signed)
Brief consult note: Full note to follow. 78 y.o. male with history of hypertension, diabetes, chronic renal insufficiency, hypertension, elevated PSA, family history of prostate cancers in both brothers, lives alone and is still actively working, presents to the urgent care clinic complaining of bilateral pedal edema along with been feeling weak. Patient was found to be in hyponatremia, and nephrology followed the patient in the hospital. Patient was diuresed with lasix. At this time patient's metabolic profile is back to baseline but now has generalized weakness and is nonverbal,?metabolic encephalopathy.. Discussed with family in detail,  Following are recommendations: 1. Insert panda tube..trial of tube feedings for next three days If no change in mental status, family does not want long term nutrition with peg tube.

## 2011-10-20 NOTE — Progress Notes (Signed)
Assessment:  1. Hyponatremia, sNA now increasing.  2. CKD with sever azotemia due to diuretic treatment-  3. Mild hypercalcemia , resolved 4 Hypokalemia Plan- cont IVF, change to 1/2 ns, k replace   Subjective: Interval History:"OK"  Objective: Vital signs in last 24 hours: Temp:  [97.3 F (36.3 C)-98 F (36.7 C)] 97.3 F (36.3 C) (07/03 0454) Pulse Rate:  [85-92] 85  (07/03 0454) Resp:  [14-18] 14  (07/03 0454) BP: (138-173)/(73-76) 173/74 mmHg (07/03 0454) SpO2:  [92 %-99 %] 92 % (07/03 0740) Weight:  [58.6 kg (129 lb 3 oz)] 58.6 kg (129 lb 3 oz) (07/03 0454) Weight change: -1 kg (-2 lb 3.3 oz)  Intake/Output from previous day: 07/02 0701 - 07/03 0700 In: 1243.8 [I.V.:1233.8; IV Piggyback:10] Out: 1500 [Urine:1500] Intake/Output this shift:    General appearance: cachectic Thin appears dehydrated Sunken facial features Lungs poor effort Cor RRR abd soft No edema  Lab Results:  Basename 10/20/11 0540 10/19/11 0534  WBC 8.8 8.9  HGB 10.6* 9.8*  HCT 32.8* 29.5*  PLT 190 199   BMET:  Basename 10/20/11 0540 10/19/11 0534  NA 145 142  K 3.4* 4.1  CL 103 98  CO2 32 31  GLUCOSE 242* 271*  BUN 92* 100*  CREATININE 1.56* 1.85*  CALCIUM 10.5 10.9*   No results found for this basename: PTH:2 in the last 72 hours Iron Studies: No results found for this basename: IRON,TIBC,TRANSFERRIN,FERRITIN in the last 72 hours Studies/Results: No results found.  Scheduled:   . aspirin  300 mg Rectal Daily  . capsaicin   Topical BID  . cyanocobalamin  1,000 mcg Intramuscular Q30 days  . darbepoetin (ARANESP) injection - NON-DIALYSIS  100 mcg Subcutaneous Q Mon-1800  . heparin  5,000 Units Subcutaneous Q8H  . hydrALAZINE  5 mg Intravenous Q8H  . insulin aspart  0-9 Units Subcutaneous TID WC  . insulin glargine  8 Units Subcutaneous QHS  . levalbuterol  0.63 mg Nebulization BID  . pantoprazole (PROTONIX) IV  40 mg Intravenous QHS  . prednisoLONE acetate  1 drop Both  Eyes Daily  . sodium chloride  3 mL Intravenous Q12H  . DISCONTD: cefTRIAXone (ROCEPHIN)  IV  1 g Intravenous Q24H      LOS: 18 days   Jalayia Bagheri C 10/20/2011,11:02 AM

## 2011-10-20 NOTE — Progress Notes (Signed)
Patient ID: Eric [name]  male  [id number]    DOB: March 13, 1924    DOA: 10/02/2011  PCP: Renato Shin, MD  Brief history of present illness Brief Summary  Eric [name] is an 78 y.o. male with history of hypertension, diabetes, chronic renal insufficiency, hypertension, elevated PSA, lived alone and was actively working, presented to the urgent care clinic complaining of bilateral pedal edema along with been feeling weak.  Evaluation showed hyponatremia with serum sodium of 122, creatinine of 1.5  He was felt to have hyponatremia induced by SIADH and hypervolemia, His volume status is being managed by Renal for last 8 days, he was aggressively diuresed with large doses IV lasix for several days, then demeclocycline was added to his regimen,  He was also noted to have pneumonia on LLL consolidation on CXR and CT abd, hence treated with Levaquin. Patient over the past few days as progressively declined. All workup including CT and head MRI has been negative. Consulted neurology. EEG ordered and is pending. Patient is now not eating and pocketing food. He is extremely weak and not able to swallow. This was discussed with son. Patient was made DNR, palliative care consult was placed.    Subjective: weak and lethargic, unable to obtain review of system from the patient, son at the bedside.  Objective: Weight change: -1 kg (-2 lb 3.3 oz)  Intake/Output Summary (Last 24 hours) at 10/20/11 1504 Last data filed at 10/20/11 0700  Gross per 24 hour  Intake 1243.75 ml  Output    750 ml  Net 493.75 ml   Blood pressure 173/74, pulse 85, temperature 97.3 F (36.3 C), temperature source Axillary, resp. rate 14, height 5\' 10"  (1.778 m), weight 58.6 kg (129 lb 3 oz), SpO2 92.00%.  Physical Exam: General: Lethargic, not in any acute distress HEENT: anicteric sclera, pupils reactive to light and accommodation, EOMI CVS: S1-S2 clear, no murmur rubs or gallops Chest: clear to auscultation bilaterally,  no wheezing, rales or rhonchi Abdomen: soft nontender, nondistended, normal bowel sounds, no organomegaly Extremities: no cyanosis, clubbing, trace edema noted bilaterally Neuro: Does not follow commands  Lab Results: Basic Metabolic Panel:  Lab [id number] 0540 10/19/11 0534 10/18/11 1621 10/17/11 0610  NA 145 142 -- --  K 3.4* 4.1 -- --  CL 103 98 -- --  CO2 32 31 -- --  GLUCOSE 242* 271* -- --  BUN 92* 100* -- --  CREATININE 1.56* 1.85* -- --  CALCIUM 10.5 10.9* -- --  MG -- -- 2.3 --  PHOS -- -- -- 5.6*   Liver Function Tests:  Lab 10/17/11 0610 10/16/11 0615  AST 114* 76*  ALT 69* 48  ALKPHOS 172* 195*  BILITOT 0.6 0.4  PROT 6.7 6.2  ALBUMIN 1.3* 1.2*   No results found for this basename: LIPASE:2,AMYLASE:2 in the last 168 hours  Lab 10/18/11 1617  AMMONIA 13   CBC:  Lab 10/20/11 0540 10/19/11 0534  WBC 8.8 8.9  NEUTROABS -- --  HGB 10.6* 9.8*  HCT 32.8* 29.5*  MCV 93.7 90.8  PLT 190 199   Cardiac Enzymes: No results found for this basename: CKTOTAL:3,CKMB:3,CKMBINDEX:3,TROPONINI:3 in the last 168 hours BNP: No components found with this basename: POCBNP:2 CBG:  Lab 10/20/11 0630 10/19/11 2206 10/19/11 1710 10/19/11 1136 10/19/11 0812  GLUCAP 236* 206* 214* 271* 283*     Micro Results: No results found for this or any previous visit (from the past 240 hour(s)).  Studies/Results: Ct Abdomen Pelvis Wo Contrast  10/03/2011  *RADIOLOGY REPORT*  Clinical Data: Bilateral pedal edema.  History of chronic renal insufficiency, hypertension, diabetes and elevated PSA level.  CT ABDOMEN AND PELVIS WITHOUT CONTRAST  Technique:  Multidetector CT imaging of the abdomen and pelvis was performed following the standard protocol without intravenous contrast.  Comparison: No priors.  Findings:  Lung Bases: Extensive air space consolidation throughout the left lower lobe with air bronchograms, concerning for pneumonia. Subsegmental atelectasis in the right lower lobe. There  is a small hiatal hernia. Heart size is borderline enlarged.  Abdomen/Pelvis:  There are no abnormal calcifications within the collecting system of either kidney, along the course of either ureter, or within the lumen of the urinary bladder.  No hydroureteronephrosis to suggest urinary tract obstruction at this time.  The unenhanced appearance of the liver, pancreas, spleen and bilateral adrenal glands is unremarkable.  There is high attenuation material layering dependently within the lumen of the gallbladder, likely representing biliary sludge.  However, the gallbladder does not appear distended, nor is there pericholecystic fluid or stranding at this time to suggest an acute cholecystitis.  There is a small amount of mesenteric edema and a small volume of ascites.  Atherosclerotic calcifications are noted throughout the abdominal and pelvic vasculature, without definite aneurysm. Postoperative changes of TURP are noted in the prostate gland. Urinary bladder is unremarkable in appearance.  No pneumoperitoneum.  No pathologic distension of bowel.  Normal appendix.  No definite pathologic lymphadenopathy within the abdomen or pelvis. Just beneath the right inguinal canal there is a 2.9 x 2.4 cm soft tissue attenuation lesion that is suspicious for an incompletely descended testicle.  Musculoskeletal: There are no aggressive appearing lytic or blastic lesions noted in the visualized portions of the skeleton.  IMPRESSION: 1.  Small amount of mesenteric edema and small volume of ascites. 2.  Biliary sludge layering dependently within the lumen of the gallbladder.  No signs of acute cholecystitis at this time. 3.  Normal appendix. 4.  Extensive left lower lobe airspace consolidation most compatible with pneumonia. 5.  Atherosclerosis.  6.  Small hiatal hernia. 7.  Soft tissue lesion just beneath the right inguinal canal suspicious for an incompletely distended testicle, as above.  Original Report Authenticated By:  Etheleen Mayhew, M.D.   Dg Chest 2 View  10/10/2011  *RADIOLOGY REPORT*  Clinical Data: Follow-up pneumonia.  Cough and congestion.  CHEST - 2 VIEW  Comparison: 10/06/2011  Findings: The cardiomediastinal silhouette is stable with upper limits normal heart size. Airspace disease/consolidation within the left lower lobe is again noted with probable left pleural effusion. Right basilar airspace disease/atelectasis is identified. There is no evidence of pneumothorax. No other changes identified.  IMPRESSION: Relatively unchanged chest radiograph with continued left lower lobe airspace disease/consolidation and probable effusion.  Stable right basilar atelectasis/airspace disease.  Original Report Authenticated By: Lura Em, M.D.   Dg Chest 2 View  10/03/2011  *RADIOLOGY REPORT*  Clinical Data: Bilateral leg swelling and pain for one week.  CHEST - 2 VIEW  Comparison: Chest radiograph performed 06/01/2011  Findings: The lungs are well-aerated.  Left lower lobe airspace opacification raises concern for pneumonia.  However, this does not appear to match the patient's clinical history; suggest clinical correlation and further evaluation as deemed appropriate.  There is no evidence of pleural effusion or pneumothorax.  The heart is normal in size; the mediastinal contour is within normal limits.  No acute osseous abnormalities are seen.  IMPRESSION: Left lower lobe airspace opacity raises  concern for pneumonia. However, this does not appear to match the patient's clinical history; suggest clinical correlation and further evaluation as deemed appropriate.  Original Report Authenticated By: Santa Lighter, M.D.   Ct Head Wo Contrast  10/17/2011  *RADIOLOGY REPORT*  Clinical Data: Altered mental status  CT HEAD WITHOUT CONTRAST  Technique:  Contiguous axial images were obtained from the base of the skull through the vertex without contrast.  Comparison: 04/19/2011  Findings: There is diffuse patchy low density  throughout the subcortical and periventricular white matter consistent with chronic small vessel ischemic change.  There is prominence of the sulci and ventricles consistent with brain atrophy.  There is no evidence for acute brain infarct, hemorrhage or mass.  The paranasal sinuses and the mastoid air cells are clear.  The skull is intact.  IMPRESSION:  1.  No acute intracranial abnormalities.  Original Report Authenticated By: Angelita Ingles, M.D.   Mr Brain Wo Contrast  10/17/2011  *RADIOLOGY REPORT*  Clinical Data: Altered mental status.  Weakness.  Decreased level of consciousness.  MRI HEAD WITHOUT CONTRAST  Technique:  Multiplanar, multiecho pulse sequences of the brain and surrounding structures were obtained according to standard protocol without intravenous contrast.  Comparison: Head CT same day.  Head CT 04/19/2011  Findings: Diffusion imaging does not show any acute or subacute infarction.  The brainstem is normal.  There is cerebellar atrophy with a few old small vessel cerebellar infarctions.  The cerebral hemispheres show generalized atrophy with mild chronic small vessel change within the deep white matter.  Ventricles are prominent . No cortical or large vessel territory infarction.  No mass lesion, hemorrhage, hydrocephalus or extra-axial collection.  No pituitary mass.  No inflammatory sinus disease.  There is fluid throughout the mastoid air cells bilaterally.  This could be sterile or indicate mastoiditis.  I do not see a nasopharyngeal lesion on either side.  No skull or skull base lesion.  Though the ventriculomegaly may be on the basis of ex vacuo enlargement, in proportion to the generalized atrophy, when I compare the CT scan today and last December, the ventricles appear a few millimeters larger today.  This raises the possibility of an element of hydrocephalus.  IMPRESSION: Generalized brain atrophy.  Mild chronic small vessel change.  No acute or subacute to intracranial finding.   The ventricles are prominent, but this is in proportion to the degree of generalized atrophy. However, I think the ventricles are slightly larger on the CT scan done today when compared to last December.  This raises the possibility of hydrocephalus.  Fluid throughout both mastoid air cell regions.  These could be sterile mastoid effusions or indicate mastoiditis.  Original Report Authenticated By: Jules Schick, M.D.   US Renal  10/11/2011  *RADIOLOGY REPORT*  Clinical Data: Evaluate for potential hydronephrosis.  RENAL/URINARY TRACT ULTRASOUND COMPLETE  Comparison:  CT of the abdomen and pelvis 10/03/2011.  Findings:  Right Kidney:  No hydronephrosis.  Well-preserved cortex.  Normal size and parenchymal echotexture without focal abnormalities.  11.9 cm in length  Left Kidney:  No hydronephrosis.  Well-preserved cortex.  Normal size and parenchymal echotexture without focal abnormalities.  12.0 cm in length  Bladder:  Moderately distended without focal wall abnormalities.  Incidental imaging through the prostate gland demonstrates prostatomegaly (gland measures 5.7 x 3.8 x 5.3 cm).  IMPRESSION:  1.  No focal renal abnormalities.  Specifically, no evidence of hydronephrosis. 2.  Prostatomegaly.  Original Report Authenticated By: Etheleen Mayhew, M.D.  Dg Chest Port 1 View  10/06/2011  *RADIOLOGY REPORT*  Clinical Data: Fever, shortness of breath and cough.  PORTABLE CHEST - 1 VIEW  Comparison: Plain films of the chest 06/01/2011 and 10/03/2011.  Findings: Left effusion and airspace disease and markedly worsened. There is new mild appearing right basilar airspace disease.  Heart size is upper normal.  IMPRESSION: Worsened left effusion and airspace disease compatible with progressive pneumonia.  Mild right basilar opacity is also now identified.  Original Report Authenticated By: Arvid Right. Luther Parody, M.D.    Medications: Scheduled Meds:   . aspirin  300 mg Rectal Daily  . capsaicin   Topical BID  .  cyanocobalamin  1,000 mcg Intramuscular Q30 days  . darbepoetin (ARANESP) injection - NON-DIALYSIS  100 mcg Subcutaneous Q Mon-1800  . heparin  5,000 Units Subcutaneous Q8H  . hydrALAZINE  5 mg Intravenous Q8H  . insulin aspart  0-15 Units Subcutaneous TID WC  . insulin aspart  0-5 Units Subcutaneous QHS  . insulin glargine  15 Units Subcutaneous QHS  . levalbuterol  0.63 mg Nebulization BID  . pantoprazole (PROTONIX) IV  40 mg Intravenous QHS  . prednisoLONE acetate  1 drop Both Eyes Daily  . sodium chloride  3 mL Intravenous Q12H  . DISCONTD: cefTRIAXone (ROCEPHIN)  IV  1 g Intravenous Q24H  . DISCONTD: insulin aspart  0-9 Units Subcutaneous TID WC  . DISCONTD: insulin glargine  8 Units Subcutaneous QHS   Continuous Infusions:   . 0.45 % NaCl with KCl 20 mEq / L 75 mL/hr at 10/20/11 1247  . DISCONTD: sodium chloride 75 mL/hr (10/19/11 1433)     Assessment/Plan:  Bilateral LE edema likely due to decompensated acute on chronic diastolic CHF and CKD  -2-D echo 01/13 ejection fraction 0000000, Grade 1 diastolic dysfunction, no valvular abnormalities  - Renal service following    Metabolic encephalopathy: probably worsened from Pneumonia, and hyponatremia. Head CT and MRI both negative for acute stroke . - Neurology following, continue correcting underlying metabolic abnormalities, routine EKG to be done today, minimum sedating meds   Pneumonia/ Left lower lobe consolidation with effusion on CXR and CT abd pelvis: started IV levaquin 6/19, repeat CXR with increasing L lung consolidation, continue xopenex nebs  - Clinically improving, afebrile now, WBC normalized, oxygen requirements improved as well, completed 10 days of Levaquin on 6/28   Hyponatremia: Multifactorial, Initially volume overload, SIADH, resolved  - Was aggressively diuresed from admission   ARF on CKD2: Renal USG with normal parenchyma and no Hydronephrosis, creatinine was 1.2 on admission  - SPEP/UPEP pending,  suspect worsened secondary to diuresis /ARB, ARB stopped , continue to monitor  - Renal service following  DM (diabetes mellitus), type 1, uncontrolled w/ophthalmic complication  Increased Lantus to 15 units, placed on moderate sliding scale  Anemia: H&H stable, continue aranesp  Generalized weakness  Continue PT/OT recommending SNF.   HTN: Stable   Dysphagia: patient was noted to be pocketing food. Speech evaluation was done and recommended continuing n.p.o. further close to be addressed by palliative meeting today regarding feeding.  DVT Prophylaxis: Subcutaneous heparin  Code Status: DNR  Disposition: Awaiting palliative goals of care meeting today   LOS: 18 days   Felina Tello M.D. [facility] 10/20/2011, 3:04 PM Pager: [phone]  If 7PM-7AM, please contact night-coverage [email or web address] Password [email or web address]

## 2011-10-20 NOTE — Progress Notes (Signed)
[facility] PROGRESS NOTE    SUBJECTIVE   Patient remains extremely lethargic and weak. Speech is whisper in quality but able to follow commands.   OBJECTIVE   Vital signs in last 24 hours: Temp:  [97.3 F (36.3 C)-98 F (36.7 C)] 97.3 F (36.3 C) (07/03 0454) Pulse Rate:  [85-92] 85  (07/03 0454) Resp:  [14-18] 14  (07/03 0454) BP: (138-173)/(73-76) 173/74 mmHg (07/03 0454) SpO2:  [92 %-99 %] 92 % (07/03 0740) Weight:  [58.6 kg (129 lb 3 oz)] 58.6 kg (129 lb 3 oz) (07/03 0454)  Intake/Output from previous day: 07/02 0701 - 07/03 0700 In: 1243.8 [I.V.:1233.8; IV Piggyback:10] Out: 1500 [Urine:1500] Intake/Output this shift:   Nutritional status: NPO  Past Medical History  Diagnosis Date  . ANEMIA-NOS 02/17/2007  . DEPRESSION 12/25/2008  . HYPERTENSION 02/17/2007  . GERD 02/17/2007  . OSTEOARTHRITIS 03/04/2010  . DIABETES MELLITUS, TYPE I, CONTROLLED, WITH RETINOPATHY   . Gastroparesis   . BENIGN PROSTATIC HYPERTROPHY   . VITAMIN B12 DEFICIENCY   . BACK PAIN, LUMBAR   . LIVER DISORDER   . PSA, INCREASED   . Hyperlipidemia   . Diabetes mellitus   . Pneumonia   . Anxiety   . Encephalopathy, metabolic     Neurologic Exam:  Mental Status:  Alert Very lethargic, oriented X 1. Speech fluent remains whisper quality. He is able to follow simple commands. Overall looks malnourished.  Cranial Nerves:  II-Visual fields grossly intact.  III/IV/VI-Extraocular movements intact. Pupils reactive bilaterally. Ptosis not present.  V/VII-Smile symmetric  VIII-grossly intact  IX/X-normal gag  XI-bilateral shoulder shrug  XII-midline tongue extension  Motor: 3/5 bilaterally with increased tone and decreased bulk throughout.   Sensory: Pinprick and light touch intact throughout, bilaterally  Deep Tendon Reflexes: 1+ and symmetric throughout.  Plantars: Right: mute Left: mute  Cerebellar: Normal finger-to-nose,    Lab  Results: Lab Results  Component Value Date/Time   CHOL 108 04/20/2011  4:40 AM   Lipid Panel No results found for this basename: CHOL,TRIG,HDL,CHOLHDL,VLDL,LDLCALC in the last 72 hours  Studies/Results: No results found.  Medications:     Scheduled:   . aspirin  300 mg Rectal Daily  . capsaicin   Topical BID  . cyanocobalamin  1,000 mcg Intramuscular Q30 days  . darbepoetin (ARANESP) injection - NON-DIALYSIS  100 mcg Subcutaneous Q Mon-1800  . heparin  5,000 Units Subcutaneous Q8H  . hydrALAZINE  5 mg Intravenous Q8H  . insulin aspart  0-9 Units Subcutaneous TID WC  . insulin glargine  8 Units Subcutaneous QHS  . levalbuterol  0.63 mg Nebulization BID  . pantoprazole (PROTONIX) IV  40 mg Intravenous QHS  . prednisoLONE acetate  1 drop Both Eyes Daily  . sodium chloride  3 mL Intravenous Q12H  . DISCONTD: cefTRIAXone (ROCEPHIN)  IV  1 g Intravenous Q24H    Assessment/Plan:  78 YO male with significant metabolic abnormalities since admission. Patient has LLL PNA, hyponatremia, elevated BUN and creatinine, elevated AST and ALT, and glucose. Recent ammonia and magnesium were normal. Likely cause of AMS is metabolic encephalopathy.   Recommendations continue to be:  1) Continue to correct underlying metabolic abnormalities including glucose, BUN/creatinine.  2) continue ABX for LLL PNA  3) Keep all sedating medications to  a minimum.  40 Routine EEG--To be done today  Patient was made DO NOT RESUSCITATE. Placed a palliative care consult was placed by     Etta Quill PA-C [facility] [phone]  10/20/2011, 8:51 AM

## 2011-10-20 NOTE — Progress Notes (Signed)
Inpatient Diabetes Program Recommendations  AACE/ADA: New Consensus Statement on Inpatient Glycemic Control (2009)  Target Ranges:  Prepandial:   less than 140 mg/dL      Peak postprandial:   less than 180 mg/dL (1-2 hours)      Critically ill patients:  140 - 180 mg/dL   Reason for Visit: CBG's continue greater than 200 mg/dL.  Results for Eric [name], Eric [name] (MRN [id number]) as of 10/20/2011 11:59  Ref. Range 10/19/2011 11:36 10/19/2011 17:10 10/19/2011 22:06 10/20/2011 05:40 10/20/2011 06:30  Glucose-Capillary Latest Range: 70-99 mg/dL 271 (H) 214 (H) 206 (H)  236 (H)   Please increase Lantus to 15 units daily.  Also consider increasing correction to moderate tid wc and HS.   Note: Will follow.

## 2011-10-20 NOTE — Progress Notes (Signed)
Palliative Medicine Team received consult for goals of care requested by Dr Rosana Hoes.  Chart reviewed, pt admitted 6/15 with BLE edema and weakness, hyopnatermia, r/t SIADH and hypervolemia and LLL consolidation. Patient progressively declining; not eating pocketing food, failed swallow eval; PMH: hypertension, diabetes, chronic renal insufficiency, hypertension, elevated PSA. Spoke with patient's son Awanda Mink, at bedside, here from out of town, and contacted son Doren Custard (h: [phone]); meeting scheduled for today, Wednesday when family can be available -Doren Custard will contact patient's sisters to inform of meeting time today, Wednesday, 10/20/11 @ 5-5:30 pm Contacts: son Doren Custard [id number]; h:[phone]; son Awanda Mink [phone]   Danton Sewer, [place]  10/07/2011, 5:28 PM  Palliative Medicine Team RN Liaison  [phone]

## 2011-10-20 NOTE — Progress Notes (Signed)
Palliative Medicine Team SW Referred for family support. Reviewed case with CSW Rhea Pink for background on psychosocial/family dynamics. No family present at time of visit, RN reports they do not plan to return until this evening's GOC. Left my contact info in room. Pt resting comfortably, did not arouse to verbal stimulus. Will follow up Monday if pt still here.   Jim Like, [place] Pager [phone]

## 2011-10-20 NOTE — Progress Notes (Signed)
CSW is continuing to follow and will assist with all d/c needs when patient is medically stable. CSW sent updated clinicals to [facility].  Rhea Pink, MSW, [place]

## 2011-10-20 NOTE — Progress Notes (Signed)
Speech Language Pathology Dysphagia Treatment Patient Details Name: Eric [name] MRN: [id number] DOB: 11-02-1923 Today's Date: 10/20/2011 Time: [id number] SLP Time Calculation (min): 18 min  Assessment / Plan / Recommendation Clinical Impression  Treatment focused on continued f/u for po readiness. Patient remains lethargic however was able to focus attention with clinician provided auditory cueing via opening eyes and verbalizing "yeah". Patient however unable to maintain appropriate level of alertness for po trials. Oral cavity cleaned by SLP with noted moderate degree of brown, dried secretions coating tongue, teeth, and hard palate indicative of poor secretion management. 1-2 spontaneous swallows elicited during oral care which were noted to be sluggish in hyolaryngeal elevation. Wet, along with very dysphonic, vocal quality noted with limited verbalizations. Patient continues to be unsafe for pos. SLP will continue to f/u.     Diet Recommendation  Continue with Current Diet: NPO    SLP Plan Continue with current plan of care   Pertinent Vitals/Pain n/a   Swallowing Goals  SLP Swallowing Goals Goal #3: Pt will maintain level of alertness for 3 minutes to accept po bolus and aid in determining readiness for po intake with min verbal cues.   Swallow Study Goal #3 - Progress: Progressing toward goal  General Temperature Spikes Noted: No Respiratory Status: Supplemental O2 delivered via (comment) (2.5 L nasal cannula) Behavior/Cognition: Lethargic;Requires cueing Oral Cavity - Dentition: Adequate natural dentition Patient Positioning: Upright in bed  Oral Cavity - Oral Hygiene   Oral care complete by SLP  Dysphagia Treatment Treatment focused on: Upgraded PO texture trials;Patient/family/caregiver education Family/Caregiver Educated: son Treatment Methods/Modalities: Skilled observation;Differential diagnosis Patient observed directly with PO's: No Reason PO's not observed:  [place], [place] [phone]   Barry Culverhouse Meryl 10/20/2011, 10:02 AM

## 2011-10-20 NOTE — Clinical Documentation Improvement (Signed)
MALNUTRITION DOCUMENTATION CLARIFICATION  THIS DOCUMENT IS NOT A PERMANENT PART OF THE MEDICAL RECORD  TO RESPOND TO THE THIS QUERY, FOLLOW THE INSTRUCTIONS BELOW:  1. If needed, update documentation for the patient's encounter via the notes activity.  2. Access this query again and click edit on the [employer].  3. After updating, or not, click F2 to complete all highlighted (required) fields concerning your review. Select "additional documentation in the medical record" OR "no additional documentation provided".  4. Click Sign note button.  5. The deficiency will fall out of your In Basket *Please let us know if you are not able to complete this workflow by phone or e-mail (listed below).  Please update your documentation within the medical record to reflect your response to this query.                                                                                        10/20/11   Dear [facility],  In a better effort to capture your patient's severity of illness, reflect appropriate length of stay and utilization of resources, a review of the patient medical record has revealed the following indicators.    Based on your clinical judgment, please clarify and document in a progress note and/or discharge summary the clinical condition associated with the following supporting information:  In responding to this query please exercise your independent judgment.  The fact that a query is asked, does not imply that any particular answer is desired or expected.  Possible Clinical Conditions?  __x_____Severe Malnutrition   _______Severe Protein Calorie Malnutrition   _______Other Condition _______Cannot clinically determine    Risk Factors: Per 7/02 progress notes: patient with a progressive decline, not eating, pocketing food, not able to swallow, extremely weak.   Signs & Symptoms: Ht: 5'10"     Wt: 59.6kg  BMI:  18.85  Weight  Loss: 16% weight loss since  admission to hospital.  Diagnostics:   Treatment  Enteral Feeding: RD recommends panda placement and starting Jevity 1.2 @20ml /hr and increasing by 65ml/hr up to goal of 65ml/hr.  Nutrition Consult: 10/19/11   You may use possible, probable, or suspect with inpatient documentation. possible, probable, suspected diagnoses MUST be documented at the time of discharge  Reviewed:  no additional documentation provided Please send to present physician for documentation  Thank You,  Theron Arista,  Clinical Documentation Specialist:  Pager: [phone]

## 2011-10-21 ENCOUNTER — Inpatient Hospital Stay (HOSPITAL_COMMUNITY): Payer: Medicare Other

## 2011-10-21 DIAGNOSIS — R4182 Altered mental status, unspecified: Secondary | ICD-10-CM

## 2011-10-21 LAB — GLUCOSE, CAPILLARY
Glucose-Capillary: 125 mg/dL — ABNORMAL HIGH (ref 70–99)
Glucose-Capillary: 135 mg/dL — ABNORMAL HIGH (ref 70–99)
Glucose-Capillary: 145 mg/dL — ABNORMAL HIGH (ref 70–99)

## 2011-10-21 LAB — HEMOGLOBIN A1C
Hgb A1c MFr Bld: 10.6 % — ABNORMAL HIGH (ref <5.7)
Mean Plasma Glucose: 258 mg/dL — ABNORMAL HIGH (ref <117)

## 2011-10-21 LAB — CBC
HCT: 30.1 % — ABNORMAL LOW (ref 39.0–52.0)
Hemoglobin: 9.6 g/dL — ABNORMAL LOW (ref 13.0–17.0)
MCH: 30.3 pg (ref 26.0–34.0)
RBC: 3.17 MIL/uL — ABNORMAL LOW (ref 4.22–5.81)

## 2011-10-21 LAB — BASIC METABOLIC PANEL
BUN: 69 mg/dL — ABNORMAL HIGH (ref 6–23)
CO2: 30 mEq/L (ref 19–32)
Calcium: 10.7 mg/dL — ABNORMAL HIGH (ref 8.4–10.5)
Glucose, Bld: 137 mg/dL — ABNORMAL HIGH (ref 70–99)
Sodium: 152 mEq/L — ABNORMAL HIGH (ref 135–145)

## 2011-10-21 MED ORDER — DEXTROSE 5 % IV SOLN
INTRAVENOUS | Status: DC
  Administered 2011-10-21: 75 mL via INTRAVENOUS

## 2011-10-21 MED ORDER — JEVITY 1.2 CAL PO LIQD
1000.0000 mL | ORAL | Status: DC
  Administered 2011-10-21: 21:00:00
  Administered 2011-10-21 – 2011-10-25 (×5): 1000 mL
  Filled 2011-10-21 (×10): qty 1000

## 2011-10-21 MED ORDER — FREE WATER
200.0000 mL | Freq: Three times a day (TID) | Status: DC
  Administered 2011-10-21 – 2011-10-25 (×9): 200 mL

## 2011-10-21 MED ORDER — INSULIN ASPART 100 UNIT/ML ~~LOC~~ SOLN
0.0000 [IU] | SUBCUTANEOUS | Status: DC
  Administered 2011-10-21: 2 [IU] via SUBCUTANEOUS
  Administered 2011-10-22: 5 [IU] via SUBCUTANEOUS
  Administered 2011-10-22: 3 [IU] via SUBCUTANEOUS
  Administered 2011-10-22: 2 [IU] via SUBCUTANEOUS
  Administered 2011-10-22: 5 [IU] via SUBCUTANEOUS
  Administered 2011-10-23 (×2): 2 [IU] via SUBCUTANEOUS
  Administered 2011-10-23: 5 [IU] via SUBCUTANEOUS
  Administered 2011-10-23: 2 [IU] via SUBCUTANEOUS
  Administered 2011-10-24 (×2): 3 [IU] via SUBCUTANEOUS
  Administered 2011-10-24 (×2): 2 [IU] via SUBCUTANEOUS
  Administered 2011-10-24: 5 [IU] via SUBCUTANEOUS
  Administered 2011-10-25: 2 [IU] via SUBCUTANEOUS
  Administered 2011-10-25: 5 [IU] via SUBCUTANEOUS
  Administered 2011-10-25 (×2): 3 [IU] via SUBCUTANEOUS
  Administered 2011-10-25 – 2011-10-26 (×4): 2 [IU] via SUBCUTANEOUS
  Administered 2011-10-26 (×2): 3 [IU] via SUBCUTANEOUS

## 2011-10-21 MED ORDER — JEVITY 1.2 CAL PO LIQD
1000.0000 mL | ORAL | Status: DC
  Filled 2011-10-21 (×3): qty 1000

## 2011-10-21 NOTE — Progress Notes (Signed)
10/21/11 @ 2115  NG tube feeding started at 20cc/hr  As ngt placement confirmed by radiology and order from triad Tylene Fantasia NP. Pt tolerating feeding well. HOB elevated at 30 degrees.

## 2011-10-21 NOTE — Progress Notes (Signed)
D/C feeding tube unable to pass anything through tube. Meeting resistance -tried turning pt side to side. Inserted small bore # 12 NGT per Rt nares. Ascultated air bolus in stomach area. Ordered ABD xray portable. As ordered passed onto Night RN if placement is good to restart tube feedings as ordered. Sign placed at [facility] to keep pt's HOB up 30 degrees while feedings are infusing.  Delray Alt RN

## 2011-10-21 NOTE — Progress Notes (Signed)
ABD xray done to confirm placement of Panda tube Call to DR Rai orders to pull back tube 15 to 20 cm and readvance repeat ABD xray to recheck placement

## 2011-10-21 NOTE — Progress Notes (Signed)
Pt returned from [place]. Cleared to feed using NG Kangaroo feeding tube. Called Pharmacy for Jevity 1.2 ordered to start @ 50 cc/hr. T Costella Schwarz RN

## 2011-10-21 NOTE — Progress Notes (Signed)
Unable to remove guide wire from Panda tube by 3 nurses. Entire Panda tube removed and guide wire still could not be removed. MD on call Baltazar Najjar) notified and new order to have pt NPO tonight and have new panda tube placed tomorrow. Family at bedside during the entire process and main concern was to have pt receive food. Explained to family MD decision and family understood and stated as long as someone would work on it for tomorrow, he was fine. Updated pt when MD orders were in place. Will continue to monitor.

## 2011-10-21 NOTE — Progress Notes (Addendum)
Call to DR Rai - [place] tube has not flushed well since pt had it put in. Very sluggish. Ordesr received to pull tube place small bore NGT and start feedings as ordered after repeat KUB to confirm proper placement . Pt turned side to side to see if different positioning would  help tube. HOB remains up at 30 degrees. Delray Alt RN

## 2011-10-21 NOTE — Progress Notes (Signed)
Assessment:  1. Hypernatremic, sNA now increasing.  2. CKD with severe azotemia improved with fluids 3. Mild hypercalcemia , recurrent 4 Hypokalemia, resolved  Plan--- Will stop 1/2NS and H2O ordered.    Subjective: Interval History: Feels better  Objective: Vital signs in last 24 hours: Temp:  [97.3 F (36.3 C)-97.6 F (36.4 C)] 97.6 F (36.4 C) (07/04 0509) Pulse Rate:  [83-99] 99  (07/04 0509) Resp:  [18] 18  (07/04 0509) BP: (157-169)/(73-82) 160/82 mmHg (07/04 0509) SpO2:  [98 %-100 %] 100 % (07/04 0509) Weight:  [58.8 kg (129 lb 10.1 oz)] 58.8 kg (129 lb 10.1 oz) (07/04 0509) Weight change: 0.2 kg (7.1 oz)  Intake/Output from previous day: 07/03 0701 - 07/04 0700 In: 0  Out: 750 [Urine:750] Intake/Output this shift: Total I/O In: -  Out: 300 [Urine:300]  Weak General appearance: cooperative Head: Normocephalic, without obvious abnormality, atraumatic Resp: rhonchi bibasilar Chest wall: no tenderness GI: soft, non-tender; bowel sounds normal; no masses,  no organomegaly Extremities: extremities normal, atraumatic, no cyanosis or edema  Lab Results:  Basename 10/21/11 0825 10/20/11 0540  WBC 8.7 8.8  HGB 9.6* 10.6*  HCT 30.1* 32.8*  PLT 211 190   BMET:  Basename 10/21/11 0825 10/20/11 0540  NA 152* 145  K 3.7 3.4*  CL 114* 103  CO2 30 32  GLUCOSE 137* 242*  BUN 69* 92*  CREATININE 1.32 1.56*  CALCIUM 10.7* 10.5   No results found for this basename: PTH:2 in the last 72 hours Iron Studies: No results found for this basename: IRON,TIBC,TRANSFERRIN,FERRITIN in the last 72 hours Studies/Results: Dg Abd Portable 1v  10/21/2011  *RADIOLOGY REPORT*  Clinical Data: Evaluate feeding tube placement  PORTABLE ABDOMEN - 1 VIEW  Comparison: Portable abdomen of 10/20/2011  Findings: The feeding tube coils in the antrum of the stomach with the tip reentering the fundus of the stomach.  The bowel gas pattern is nonspecific.  Opacity is noted at both lung bases most  consistent with pneumonia.  IMPRESSION:   Feeding tube coils in the distal antrum of the stomach, with the tip within the fundus of the stomach.  Original Report Authenticated By: Joretta Bachelor, M.D.   Dg Abd Portable 1v  10/20/2011  *RADIOLOGY REPORT*  Clinical Data: Panda tube placement  PORTABLE ABDOMEN - 1 VIEW  Comparison: 09/09/2009  Findings: There is a NG feeding tube coiled within stomach with tip in proximal stomach.  IMPRESSION: NG feeding tube coiled within stomach with tip in proximal stomach.  Original Report Authenticated By: Lahoma Crocker, M.D.    Scheduled:   . aspirin  300 mg Rectal Daily  . capsaicin   Topical BID  . cyanocobalamin  1,000 mcg Intramuscular Q30 days  . darbepoetin (ARANESP) injection - NON-DIALYSIS  100 mcg Subcutaneous Q Mon-1800  . free water  200 mL Per Tube Q8H  . heparin  5,000 Units Subcutaneous Q8H  . hydrALAZINE  5 mg Intravenous Q8H  . insulin aspart  0-15 Units Subcutaneous TID WC  . insulin aspart  0-5 Units Subcutaneous QHS  . insulin glargine  15 Units Subcutaneous QHS  . levalbuterol  0.63 mg Nebulization BID  . pantoprazole (PROTONIX) IV  40 mg Intravenous QHS  . prednisoLONE acetate  1 drop Both Eyes Daily  . sodium chloride  3 mL Intravenous Q12H  . DISCONTD: insulin aspart  0-9 Units Subcutaneous TID WC  . DISCONTD: insulin glargine  8 Units Subcutaneous QHS      LOS: 19 days  Eric [name] C 10/21/2011,12:42 PM

## 2011-10-21 NOTE — Progress Notes (Signed)
Patient ID: Eric [name]  male  [id number]    DOB: [birth year]    DOA: 10/02/2011  PCP: Renato Shin, MD  Brief history of present illness Brief Summary  Eric [name] is an 78 y.o. male with history of hypertension, diabetes, chronic renal insufficiency, hypertension, elevated PSA, lived alone and was actively working, presented to the urgent care clinic complaining of bilateral pedal edema along with been feeling weak.  Evaluation showed hyponatremia with serum sodium of 122, creatinine of 1.5  He was felt to have hyponatremia induced by SIADH and hypervolemia, His volume status is being managed by Renal for last 8 days, he was aggressively diuresed with large doses IV lasix for several days, then demeclocycline was added to his regimen,  He was also noted to have pneumonia on LLL consolidation on CXR and CT abd, hence treated with Levaquin. Patient over the past few days as progressively declined. All workup including CT and head MRI has been negative. Consulted neurology. EEG ordered and is pending. Patient is now not eating and pocketing food. He is extremely weak and not able to swallow. This was discussed with son. Patient was made DNR, palliative care consult was placed, patient's family requested a trial of panda tube feedings.    Subjective: Patient appears to be more lethargic today, unable to obtain review of system from the patient  Objective: Weight change: 0.2 kg (7.1 oz)  Intake/Output Summary (Last 24 hours) at 10/21/11 1317 Last data filed at 10/21/11 1034  Gross per 24 hour  Intake      0 ml  Output   1050 ml  Net  -1050 ml   Blood pressure 160/82, pulse 99, temperature 97.6 F (36.4 C), temperature source Axillary, resp. rate 18, height 5\' 10"  (1.778 m), weight 58.8 kg (129 lb 10.1 oz), SpO2 100.00%.  Physical Exam: General: Lethargic, not in any acute distress HEENT: anicteric sclera, pupils reactive to light and accommodation, EOMI CVS: S1-S2 clear, no murmur  rubs or gallops Chest: clear to auscultation bilaterally, no wheezing, rales or rhonchi Abdomen: soft nontender, nondistended, normal bowel sounds, no organomegaly Extremities: no cyanosis, clubbing, trace edema noted bilaterally Neuro: Does not follow commands  Lab Results: Basic Metabolic Panel:  Lab [id number] 0825 10/20/11 0540 10/18/11 1621 10/17/11 0610  NA 152* 145 -- --  K 3.7 3.4* -- --  CL 114* 103 -- --  CO2 30 32 -- --  GLUCOSE 137* 242* -- --  BUN 69* 92* -- --  CREATININE 1.32 1.56* -- --  CALCIUM 10.7* 10.5 -- --  MG -- -- 2.3 --  PHOS -- -- -- 5.6*   Liver Function Tests:  Lab 10/17/11 0610 10/16/11 0615  AST 114* 76*  ALT 69* 48  ALKPHOS 172* 195*  BILITOT 0.6 0.4  PROT 6.7 6.2  ALBUMIN 1.3* 1.2*   No results found for this basename: LIPASE:2,AMYLASE:2 in the last 168 hours  Lab 10/18/11 1617  AMMONIA 13   CBC:  Lab 10/21/11 0825 10/20/11 0540  WBC 8.7 8.8  NEUTROABS -- --  HGB 9.6* 10.6*  HCT 30.1* 32.8*  MCV 95.0 93.7  PLT 211 190   Cardiac Enzymes: No results found for this basename: CKTOTAL:3,CKMB:3,CKMBINDEX:3,TROPONINI:3 in the last 168 hours BNP: No components found with this basename: POCBNP:2 CBG:  Lab 10/21/11 0619 10/21/11 0028 10/20/11 2032 10/20/11 1610 10/20/11 1100  GLUCAP 135* 145* 117* 156* 219*     Micro Results: No results found for this or any previous visit (  from the past 240 hour(s)).  Studies/Results: Ct Abdomen Pelvis Wo Contrast  10/03/2011  *RADIOLOGY REPORT*  Clinical Data: Bilateral pedal edema.  History of chronic renal insufficiency, hypertension, diabetes and elevated PSA level.  CT ABDOMEN AND PELVIS WITHOUT CONTRAST  Technique:  Multidetector CT imaging of the abdomen and pelvis was performed following the standard protocol without intravenous contrast.  Comparison: No priors.  Findings:  Lung Bases: Extensive air space consolidation throughout the left lower lobe with air bronchograms, concerning for  pneumonia. Subsegmental atelectasis in the right lower lobe. There is a small hiatal hernia. Heart size is borderline enlarged.  Abdomen/Pelvis:  There are no abnormal calcifications within the collecting system of either kidney, along the course of either ureter, or within the lumen of the urinary bladder.  No hydroureteronephrosis to suggest urinary tract obstruction at this time.  The unenhanced appearance of the liver, pancreas, spleen and bilateral adrenal glands is unremarkable.  There is high attenuation material layering dependently within the lumen of the gallbladder, likely representing biliary sludge.  However, the gallbladder does not appear distended, nor is there pericholecystic fluid or stranding at this time to suggest an acute cholecystitis.  There is a small amount of mesenteric edema and a small volume of ascites.  Atherosclerotic calcifications are noted throughout the abdominal and pelvic vasculature, without definite aneurysm. Postoperative changes of TURP are noted in the prostate gland. Urinary bladder is unremarkable in appearance.  No pneumoperitoneum.  No pathologic distension of bowel.  Normal appendix.  No definite pathologic lymphadenopathy within the abdomen or pelvis. Just beneath the right inguinal canal there is a 2.9 x 2.4 cm soft tissue attenuation lesion that is suspicious for an incompletely descended testicle.  Musculoskeletal: There are no aggressive appearing lytic or blastic lesions noted in the visualized portions of the skeleton.  IMPRESSION: 1.  Small amount of mesenteric edema and small volume of ascites. 2.  Biliary sludge layering dependently within the lumen of the gallbladder.  No signs of acute cholecystitis at this time. 3.  Normal appendix. 4.  Extensive left lower lobe airspace consolidation most compatible with pneumonia. 5.  Atherosclerosis.  6.  Small hiatal hernia. 7.  Soft tissue lesion just beneath the right inguinal canal suspicious for an incompletely  distended testicle, as above.  Original Report Authenticated By: Etheleen Mayhew, M.D.   Dg Chest 2 View  10/10/2011  *RADIOLOGY REPORT*  Clinical Data: Follow-up pneumonia.  Cough and congestion.  CHEST - 2 VIEW  Comparison: 10/06/2011  Findings: The cardiomediastinal silhouette is stable with upper limits normal heart size. Airspace disease/consolidation within the left lower lobe is again noted with probable left pleural effusion. Right basilar airspace disease/atelectasis is identified. There is no evidence of pneumothorax. No other changes identified.  IMPRESSION: Relatively unchanged chest radiograph with continued left lower lobe airspace disease/consolidation and probable effusion.  Stable right basilar atelectasis/airspace disease.  Original Report Authenticated By: Lura Em, M.D.   Dg Chest 2 View  10/03/2011  *RADIOLOGY REPORT*  Clinical Data: Bilateral leg swelling and pain for one week.  CHEST - 2 VIEW  Comparison: Chest radiograph performed 06/01/2011  Findings: The lungs are well-aerated.  Left lower lobe airspace opacification raises concern for pneumonia.  However, this does not appear to match the patient's clinical history; suggest clinical correlation and further evaluation as deemed appropriate.  There is no evidence of pleural effusion or pneumothorax.  The heart is normal in size; the mediastinal contour is within normal limits.  No  acute osseous abnormalities are seen.  IMPRESSION: Left lower lobe airspace opacity raises concern for pneumonia. However, this does not appear to match the patient's clinical history; suggest clinical correlation and further evaluation as deemed appropriate.  Original Report Authenticated By: Santa Lighter, M.D.   Ct Head Wo Contrast  10/17/2011  *RADIOLOGY REPORT*  Clinical Data: Altered mental status  CT HEAD WITHOUT CONTRAST  Technique:  Contiguous axial images were obtained from the base of the skull through the vertex without contrast.   Comparison: 04/19/2011  Findings: There is diffuse patchy low density throughout the subcortical and periventricular white matter consistent with chronic small vessel ischemic change.  There is prominence of the sulci and ventricles consistent with brain atrophy.  There is no evidence for acute brain infarct, hemorrhage or mass.  The paranasal sinuses and the mastoid air cells are clear.  The skull is intact.  IMPRESSION:  1.  No acute intracranial abnormalities.  Original Report Authenticated By: Angelita Ingles, M.D.   Mr Brain Wo Contrast  10/17/2011  *RADIOLOGY REPORT*  Clinical Data: Altered mental status.  Weakness.  Decreased level of consciousness.  MRI HEAD WITHOUT CONTRAST  Technique:  Multiplanar, multiecho pulse sequences of the brain and surrounding structures were obtained according to standard protocol without intravenous contrast.  Comparison: Head CT same day.  Head CT 04/19/2011  Findings: Diffusion imaging does not show any acute or subacute infarction.  The brainstem is normal.  There is cerebellar atrophy with a few old small vessel cerebellar infarctions.  The cerebral hemispheres show generalized atrophy with mild chronic small vessel change within the deep white matter.  Ventricles are prominent . No cortical or large vessel territory infarction.  No mass lesion, hemorrhage, hydrocephalus or extra-axial collection.  No pituitary mass.  No inflammatory sinus disease.  There is fluid throughout the mastoid air cells bilaterally.  This could be sterile or indicate mastoiditis.  I do not see a nasopharyngeal lesion on either side.  No skull or skull base lesion.  Though the ventriculomegaly may be on the basis of ex vacuo enlargement, in proportion to the generalized atrophy, when I compare the CT scan today and last December, the ventricles appear a few millimeters larger today.  This raises the possibility of an element of hydrocephalus.  IMPRESSION: Generalized brain atrophy.  Mild chronic  small vessel change.  No acute or subacute to intracranial finding.  The ventricles are prominent, but this is in proportion to the degree of generalized atrophy. However, I think the ventricles are slightly larger on the CT scan done today when compared to last December.  This raises the possibility of hydrocephalus.  Fluid throughout both mastoid air cell regions.  These could be sterile mastoid effusions or indicate mastoiditis.  Original Report Authenticated By: Jules Schick, M.D.   US Renal  10/11/2011  *RADIOLOGY REPORT*  Clinical Data: Evaluate for potential hydronephrosis.  RENAL/URINARY TRACT ULTRASOUND COMPLETE  Comparison:  CT of the abdomen and pelvis 10/03/2011.  Findings:  Right Kidney:  No hydronephrosis.  Well-preserved cortex.  Normal size and parenchymal echotexture without focal abnormalities.  11.9 cm in length  Left Kidney:  No hydronephrosis.  Well-preserved cortex.  Normal size and parenchymal echotexture without focal abnormalities.  12.0 cm in length  Bladder:  Moderately distended without focal wall abnormalities.  Incidental imaging through the prostate gland demonstrates prostatomegaly (gland measures 5.7 x 3.8 x 5.3 cm).  IMPRESSION:  1.  No focal renal abnormalities.  Specifically, no evidence of  hydronephrosis. 2.  Prostatomegaly.  Original Report Authenticated By: Etheleen Mayhew, M.D.   Dg Chest Port 1 View  10/06/2011  *RADIOLOGY REPORT*  Clinical Data: Fever, shortness of breath and cough.  PORTABLE CHEST - 1 VIEW  Comparison: Plain films of the chest 06/01/2011 and 10/03/2011.  Findings: Left effusion and airspace disease and markedly worsened. There is new mild appearing right basilar airspace disease.  Heart size is upper normal.  IMPRESSION: Worsened left effusion and airspace disease compatible with progressive pneumonia.  Mild right basilar opacity is also now identified.  Original Report Authenticated By: Arvid Right. Luther Parody, M.D.    Medications: Scheduled  Meds:    . aspirin  300 mg Rectal Daily  . capsaicin   Topical BID  . cyanocobalamin  1,000 mcg Intramuscular Q30 days  . darbepoetin (ARANESP) injection - NON-DIALYSIS  100 mcg Subcutaneous Q Mon-1800  . free water  200 mL Per Tube Q8H  . heparin  5,000 Units Subcutaneous Q8H  . hydrALAZINE  5 mg Intravenous Q8H  . insulin aspart  0-15 Units Subcutaneous Q4H  . insulin aspart  0-5 Units Subcutaneous QHS  . insulin glargine  15 Units Subcutaneous QHS  . levalbuterol  0.63 mg Nebulization BID  . pantoprazole (PROTONIX) IV  40 mg Intravenous QHS  . prednisoLONE acetate  1 drop Both Eyes Daily  . sodium chloride  3 mL Intravenous Q12H  . DISCONTD: insulin aspart  0-15 Units Subcutaneous TID WC  . DISCONTD: insulin aspart  0-9 Units Subcutaneous TID WC  . DISCONTD: insulin glargine  8 Units Subcutaneous QHS   Continuous Infusions:    . dextrose    . feeding supplement (JEVITY 1.2 CAL)    . DISCONTD: 0.45 % NaCl with KCl 20 mEq / L 75 mL (10/21/11 0227)  . DISCONTD: feeding supplement (JEVITY 1.2 CAL)    . DISCONTD: feeding supplement (JEVITY 1.2 CAL)       Assessment/Plan:   Hypernatremia: - Change the IV fluids to D5W until the tube feedings started, then we'll add free water and DC the IV fluids  Bilateral LE edema likely due to decompensated acute on chronic diastolic CHF and CKD  -2-D echo 01/13 ejection fraction 0000000, Grade 1 diastolic dysfunction, no valvular abnormalities  - Appreciate nephrology recommendations   Metabolic encephalopathy: probably worsened from Pneumonia, and hyponatremia. Head CT and MRI both negative for acute stroke . - Neurology following, continue correcting underlying metabolic abnormalities, routine EEG yesetrday, minimum sedating meds   Pneumonia/ Left lower lobe consolidation with effusion on CXR and CT abd pelvis: started IV levaquin 6/19, repeat CXR with increasing L lung consolidation, continue xopenex nebs  - Clinically improving,  afebrile now, WBC normalized, oxygen requirements improved as well, completed 10 days of Levaquin on 6/28   ARF on CKD2: Resolved  - Renal USG with normal parenchyma and no hydronephrosis - SPEP/UPEP pending, suspect worsened secondary to diuresis /ARB, ARB stopped, continue to monitor   DM (diabetes mellitus), type 1, uncontrolled w/ophthalmic complication  - , Controlled, continue Lantus 15 units, moderate sliding scale  Anemia: H&H stable, continue aranesp  Generalized weakness  Continue PT/OT recommending SNF.   HTN: Stable   Dysphagia: patient was noted to be pocketing food. Speech evaluation was done and recommended continuing n.p.o. palliative meeting was done yesterday, recommended PANDA tube feeding per family's wishes. Will reattempt panda tube insertion today start tube feeding per nutrition consult.   DVT Prophylaxis: Subcutaneous heparin  Code Status: DNR  Disposition:  Not medically ready  LOS: 24 days   RAI,RIPUDEEP M.D. [facility] 10/21/2011, 1:17 PM Pager: [phone]  If 7PM-7AM, please contact night-coverage [email or web address] Password [email or web address]

## 2011-10-21 NOTE — Progress Notes (Signed)
Took pt per bed to radiology for placement of Panda-Kangaroo NGT for feeding with # 10 french. Delray Alt RN

## 2011-10-21 NOTE — Progress Notes (Signed)
SLP Cancellation Note  Treatment cancelled today due to medical issues with patient which prohibited therapy, pt remains lethargic and not appropriate for po or evaluation.  Note palliative care note of plan for temporary Panda tube feeds.    SLP spoke to Dr Tana Coast regarding situation.  Will sign off currently and MD will reorder if pt becomes alert and is able to participate.    Luanna Salk, [facility] SLP [phone]

## 2011-10-21 NOTE — Progress Notes (Addendum)
Nutrition Follow-up  Intervention:   1.  Enteral nutrition; initiate Jevity 1.2 @ 20 mL/hr continuous.  Advance by 10 mL q 8 hrs to 50 mL/hr goal.  Pt will reach goal in 24 hrs.  Current plan is for 3-4 days of nutrition support. 2.  Fluid; 200 mL free water flushes BID if IVF discontinued. 3.  Nutrition-related labs; recommend obtaining phos and Mg with am labs due to refeeding risk.  Assessment:   Pt remains lethargic, not able to participate in SLP evaluation.  Pt to receive Panda for short-term nutrition support.  Panda placement in the antrum of the stomach, RN to advance tube further.  Pt being followed by Palliative Care.  Pt assessed by RD (7/2), recommendations of Jevity 1.2 @ 20 mL/hr advanced by 10 mL q 8 hrs to 50 mL/hr goal remain appropriate.  Additional 200 mL free water flushes BID.  Regimen as prescribed will provide 1440 kcal, 67g protein, 1368 mL free water which meets 100% pt estimated kcal and protein needs.  Pt remains at refeeding risk due to no intake for 5 days, with suboptimal intake (estimated <1000 kcal/day) for >1 week.  Discussed nutrition support trial with family at bedside.  Answered questions regarding possible outcomes from nutrition support trial and what the pt may experience as he transitions from being without food to meeting his nutrition needs.    Diet Order:  NPO for safety  Meds: Scheduled Meds:   . aspirin  300 mg Rectal Daily  . capsaicin   Topical BID  . cyanocobalamin  1,000 mcg Intramuscular Q30 days  . darbepoetin (ARANESP) injection - NON-DIALYSIS  100 mcg Subcutaneous Q Mon-1800  . free water  200 mL Per Tube Q8H  . heparin  5,000 Units Subcutaneous Q8H  . hydrALAZINE  5 mg Intravenous Q8H  . insulin aspart  0-15 Units Subcutaneous TID WC  . insulin aspart  0-5 Units Subcutaneous QHS  . insulin glargine  15 Units Subcutaneous QHS  . levalbuterol  0.63 mg Nebulization BID  . pantoprazole (PROTONIX) IV  40 mg Intravenous QHS  .  prednisoLONE acetate  1 drop Both Eyes Daily  . sodium chloride  3 mL Intravenous Q12H  . DISCONTD: insulin aspart  0-9 Units Subcutaneous TID WC  . DISCONTD: insulin glargine  8 Units Subcutaneous QHS   Continuous Infusions:   . 0.45 % NaCl with KCl 20 mEq / L 75 mL (10/21/11 0227)  . feeding supplement (JEVITY 1.2 CAL)    . DISCONTD: sodium chloride 75 mL/hr (10/19/11 1433)  . DISCONTD: feeding supplement (JEVITY 1.2 CAL)     PRN Meds:.levalbuterol, morphine injection  Labs:  CMP     Component Value Date/Time   NA 152* 10/21/2011 0825   K 3.7 10/21/2011 0825   CL 114* 10/21/2011 0825   CO2 30 10/21/2011 0825   GLUCOSE 137* 10/21/2011 0825   BUN 69* 10/21/2011 0825   CREATININE 1.32 10/21/2011 0825   CALCIUM 10.7* 10/21/2011 0825   CALCIUM 8.8 04/29/2011 1005   PROT 6.7 10/17/2011 0610   ALBUMIN 1.3* 10/17/2011 0610   AST 114* 10/17/2011 0610   ALT 69* 10/17/2011 0610   ALKPHOS 172* 10/17/2011 0610   BILITOT 0.6 10/17/2011 0610   GFRNONAA 47* 10/21/2011 0825   GFRAA 55* 10/21/2011 0825     Intake/Output Summary (Last 24 hours) at 10/21/11 1014 Last data filed at 10/21/11 0700  Gross per 24 hour  Intake      0 ml  Output  750 ml  Net   -750 ml    Weight Status:  129 lbs (7/4)    Wt change:  -27 lbs since admission  Restatement of needs:  1400-1600 kcal, 60-70g  Nutrition Dx:   1.  Inadequate oral intake, ongoing 2.  Severe malnutrition of acute illness, ongoing.  Monitor:   1.  Enteral nutrition; initiation with tolerance.  Somewhat met, pt to initiate nutrition support today.   2.  Wt/wt change; monitor trends. Met, pt with trending loss. 3.  Labs; monitor trends.  Met, pt with hypernatremia, K wnl.  MD paged to request refeeding labwork.   Brynda Greathouse, MS RD LDN Clinical Inpatient Dietitian Pager: [phone]

## 2011-10-21 NOTE — Progress Notes (Signed)
ABD xray showed placement of Kangaroo feeding not in good enough place to feed. Call to DR Rai noted. Placed order for Kangaroo feeding tube placed now in Radiology. Family aware

## 2011-10-21 NOTE — Consult Note (Signed)
Consult Note from the Palliative Medicine Team at [facility] Patient MR:[id number] Eric [name]      DOB: 29-Jun-1923      [id number]   Consult Requested by: Dr Sylvester Harder     PCP: Renato Shin, MD Reason for Consultation: Goals of care     Phone Number:[phone]  Assessment and Plan: Altered mental status/ metabolic  Dysphagia  1. Discussed in detail with family, they would like a trial of tube feedings before pursuing total comfort care approach. 2. Code Status: DNR 3. Symptom Control: Dysphagia- Family wants to try trial of tube feedings for next 3-4 days if patient's mental status does not improve, they do not want long term nutrition 4. Psycho/Social: Two sons, niece and daughter in law present during the meeting 51. Spiritual: No spiritual needs at this time. 6. Disposition: To be determined  Patient Documents Completed or Given: Document Given Completed  Advanced Directives Pkt    MOST    DNR    Gone from My Sight    Hard Choices      Brief HPI: 78 y.o. male with history of hypertension, diabetes, chronic renal insufficiency, hypertension, elevated PSA, family history of prostate cancers in both brothers, lives alone and is still actively working, complaining of bilateral pedal edema along with been feeling weak.aluation showed hyponatremia with serum sodium of 122, creatinine of 1.5, bicarbonate of 19 and chloride of 89. Patient was admitted to hospital and was followed by nephrology, received iv lasix . His sodium has improved, also has diuresed about 13 liters of fluid. Patient now has altered mental status, most likely due to metabolic encephalopathy. Has poor po intake, discussed with family regarding trial of tube feedings, long term artificial nutrition with peg tube, confirmed the code status, comfort care approach if no improvement.    ROS: Unobtainable   PMH:  Past Medical History  Diagnosis Date  . ANEMIA-NOS 02/17/2007  . DEPRESSION 12/25/2008  . HYPERTENSION  02/17/2007  . GERD 02/17/2007  . OSTEOARTHRITIS 03/04/2010  . DIABETES MELLITUS, TYPE I, CONTROLLED, WITH RETINOPATHY   . Gastroparesis   . BENIGN PROSTATIC HYPERTROPHY   . VITAMIN B12 DEFICIENCY   . BACK PAIN, LUMBAR   . LIVER DISORDER   . PSA, INCREASED   . Hyperlipidemia   . Diabetes mellitus   . Pneumonia   . Anxiety   . Encephalopathy, metabolic      PSH: Past Surgical History  Procedure Date  . Transurethral resection of prostate    I have reviewed the FH and SH and  If appropriate update it with new information. No Known Allergies Scheduled Meds:   . aspirin  300 mg Rectal Daily  . capsaicin   Topical BID  . cyanocobalamin  1,000 mcg Intramuscular Q30 days  . darbepoetin (ARANESP) injection - NON-DIALYSIS  100 mcg Subcutaneous Q Mon-1800  . heparin  5,000 Units Subcutaneous Q8H  . hydrALAZINE  5 mg Intravenous Q8H  . insulin aspart  0-15 Units Subcutaneous TID WC  . insulin aspart  0-5 Units Subcutaneous QHS  . insulin glargine  15 Units Subcutaneous QHS  . levalbuterol  0.63 mg Nebulization BID  . pantoprazole (PROTONIX) IV  40 mg Intravenous QHS  . prednisoLONE acetate  1 drop Both Eyes Daily  . sodium chloride  3 mL Intravenous Q12H  . DISCONTD: insulin aspart  0-9 Units Subcutaneous TID WC  . DISCONTD: insulin glargine  8 Units Subcutaneous QHS   Continuous Infusions:   . 0.45 % NaCl  with KCl 20 mEq / L 75 mL (10/21/11 0227)  . feeding supplement (JEVITY 1.2 CAL)    . DISCONTD: sodium chloride 75 mL/hr (10/19/11 1433)  . DISCONTD: feeding supplement (JEVITY 1.2 CAL)     PRN Meds:.levalbuterol, morphine injection    BP 160/82  Pulse 99  Temp 97.6 F (36.4 C) (Axillary)  Resp 18  Ht 5\' 10"  (1.778 m)  Wt 58.8 kg (129 lb 10.1 oz)  BMI 18.60 kg/m2  SpO2 100%   PPS: 30%   Intake/Output Summary (Last 24 hours) at 10/21/11 0757 Last data filed at 10/21/11 0501  Gross per 24 hour  Intake      0 ml  Output    750 ml  Net   -750 ml     Physical Exam:  General: Lethargic HEENT:  Atraumatic, normocephalic Chest:  Clear bilaterally CVS: S1S2 RRR Abdomen: Soft, nontender Ext: No edema Neuro: Nonverbal, though able to answer some questions by nodding  Labs: CBC    Component Value Date/Time   WBC 8.8 10/20/2011 0540   RBC 3.50* 10/20/2011 0540   HGB 10.6* 10/20/2011 0540   HCT 32.8* 10/20/2011 0540   PLT 190 10/20/2011 0540   MCV 93.7 10/20/2011 0540   MCH 30.3 10/20/2011 0540   MCHC 32.3 10/20/2011 0540   RDW 15.0 10/20/2011 0540   LYMPHSABS 0.5* 10/02/2011 1938   MONOABS 0.0* 10/02/2011 1938   EOSABS 0.0 10/02/2011 1938   BASOSABS 0.0 10/02/2011 1938    BMET    Component Value Date/Time   NA 145 10/20/2011 0540   K 3.4* 10/20/2011 0540   CL 103 10/20/2011 0540   CO2 32 10/20/2011 0540   GLUCOSE 242* 10/20/2011 0540   BUN 92* 10/20/2011 0540   CREATININE 1.56* 10/20/2011 0540   CALCIUM 10.5 10/20/2011 0540   CALCIUM 8.8 04/29/2011 1005   GFRNONAA 39* 10/20/2011 0540   GFRAA 45* 10/20/2011 0540    CMP     Component Value Date/Time   NA 145 10/20/2011 0540   K 3.4* 10/20/2011 0540   CL 103 10/20/2011 0540   CO2 32 10/20/2011 0540   GLUCOSE 242* 10/20/2011 0540   BUN 92* 10/20/2011 0540   CREATININE 1.56* 10/20/2011 0540   CALCIUM 10.5 10/20/2011 0540   CALCIUM 8.8 04/29/2011 1005   PROT 6.7 10/17/2011 0610   ALBUMIN 1.3* 10/17/2011 0610   AST 114* 10/17/2011 0610   ALT 69* 10/17/2011 0610   ALKPHOS 172* 10/17/2011 0610   BILITOT 0.6 10/17/2011 0610   GFRNONAA 39* 10/20/2011 0540   GFRAA 45* 10/20/2011 0540        Time In Time Out Total Time Spent with Patient Total Overall Time  5 pm 6:15 Eric 60 min 75 min    Greater than 50%  of this time was spent counseling and coordinating care related to the above assessment and plan.

## 2011-10-22 ENCOUNTER — Inpatient Hospital Stay (HOSPITAL_COMMUNITY): Payer: Medicare Other

## 2011-10-22 LAB — BASIC METABOLIC PANEL
BUN: 65 mg/dL — ABNORMAL HIGH (ref 6–23)
Chloride: 117 mEq/L — ABNORMAL HIGH (ref 96–112)
GFR calc Af Amer: 48 mL/min — ABNORMAL LOW (ref >90)
Glucose, Bld: 107 mg/dL — ABNORMAL HIGH (ref 70–99)
Potassium: 3.7 mEq/L (ref 3.5–5.1)

## 2011-10-22 LAB — CBC
HCT: 32.6 % — ABNORMAL LOW (ref 39.0–52.0)
Hemoglobin: 10.4 g/dL — ABNORMAL LOW (ref 13.0–17.0)
WBC: 8.4 10*3/uL (ref 4.0–10.5)

## 2011-10-22 LAB — GLUCOSE, CAPILLARY
Glucose-Capillary: 89 mg/dL (ref 70–99)
Glucose-Capillary: 232 mg/dL — ABNORMAL HIGH (ref 70–99)
Glucose-Capillary: 230 mg/dL — ABNORMAL HIGH (ref 70–99)

## 2011-10-22 MED ORDER — DEXTROSE 5 % IV SOLN
INTRAVENOUS | Status: DC
  Administered 2011-10-22 – 2011-10-24 (×6): via INTRAVENOUS

## 2011-10-22 NOTE — Progress Notes (Signed)
Assessment:  1. Hypernatremic, sNA now increasing.  2. CKD with severe azotemia improved with fluids  3. Hypercalcemia , recurrent & severe based upon sAlbumin 1.3 on 6/30 4  M-spike on SPEP from 6/25 Plan--- Will give D5W, check serum IFE  Subjective: Interval History: Did not get much free H2o.  Objective: Vital signs in last 24 hours: Temp:  [97.3 F (36.3 C)-97.8 F (36.6 C)] 97.8 F (36.6 C) (07/05 0610) Pulse Rate:  [92-94] 94  (07/05 0610) Resp:  [18-20] 20  (07/05 0610) BP: (140-148)/(86-95) 140/88 mmHg (07/05 0610) SpO2:  [99 %-100 %] 99 % (07/05 0930) Weight:  [58.9 kg (129 lb 13.6 oz)] 58.9 kg (129 lb 13.6 oz) (07/05 0610) Weight change: 0.1 kg (3.5 oz)  Intake/Output from previous day: 07/04 0701 - 07/05 0700 In: 572 [P.O.:200; NG/GT:362; IV Piggyback:10] Out: 975 [Urine:975] Intake/Output this shift: Total I/O In: -  Out: 700 [Urine:700]  PE: Thin man, lethargic and weak Lungs clear Cor tachycardic No edema  Lab Results:  Basename 10/22/11 0522 10/21/11 0825  WBC 8.4 8.7  HGB 10.4* 9.6*  HCT 32.6* 30.1*  PLT 214 211   BMET:  Basename 10/22/11 0522 10/21/11 0825  NA 156* 152*  K 3.7 3.7  CL 117* 114*  CO2 29 30  GLUCOSE 107* 137*  BUN 65* 69*  CREATININE 1.47* 1.32  CALCIUM 10.9* 10.7*   No results found for this basename: PTH:2 in the last 72 hours Iron Studies: No results found for this basename: IRON,TIBC,TRANSFERRIN,FERRITIN in the last 72 hours Studies/Results: Dg Abd Portable 1v  10/22/2011  *RADIOLOGY REPORT*  Clinical Data: Confirm NG tube placement.  PORTABLE ABDOMEN - 1 VIEW  Comparison: 10/21/2011  Findings: NG tube is in place with the tip in the proximal to mid stomach.  The large amount stool and gas throughout the colon.  No evidence of bowel obstruction.  No organomegaly.  IMPRESSION: NG tube tip in the proximal to mid stomach.  Original Report Authenticated By: Raelyn Number, M.D.   Dg Abd Portable 1v  10/21/2011  *RADIOLOGY  REPORT*  Clinical Data: NG tube placement.  PORTABLE ABDOMEN - 1 VIEW  Comparison: 10/21/2011.  Findings: Nasogastric tube tip gastric antrum level.  Residual contrast within the portions of the bowel.  Overall nonspecific bowel gas pattern.  The possibility of free intraperitoneal air cannot be addressed on a supine view.  Basilar lung with parenchymal changes more notable on the left incompletely assessed on the present exam.  Chest x-ray can be obtained for further delineation.  IMPRESSION: Nasogastric tube tip gastric antrum level.  Basilar lung with parenchymal changes more notable on the left incompletely assessed on the present exam.  Chest x-ray can be obtained for further delineation.  Original Report Authenticated By: Doug Sou, M.D.   Dg Abd Portable 1v  10/21/2011  *RADIOLOGY REPORT*  Clinical Data: Adjustment of the feeding tube  PORTABLE ABDOMEN - 1 VIEW  Comparison: 10/21/2011  Findings: A NG feeding tube is again noted coiled within stomach. The tip of the NG tube has returned in the mid esophagus about 8 cm above the GE junction.  IMPRESSION: NG feeding tube coiled within stomach with tip coiled back in the esophagus about 8 cm above the GE junction.  This was made a call report to [place] By: Lahoma Crocker, M.D.   Dg Abd Portable 1v  10/21/2011  *RADIOLOGY REPORT*  Clinical Data: Evaluate feeding tube placement  PORTABLE ABDOMEN - 1  VIEW  Comparison: Portable abdomen of 10/20/2011  Findings: The feeding tube coils in the antrum of the stomach with the tip reentering the fundus of the stomach.  The bowel gas pattern is nonspecific.  Opacity is noted at both lung bases most consistent with pneumonia.  IMPRESSION:   Feeding tube coils in the distal antrum of the stomach, with the tip within the fundus of the stomach.  Original Report Authenticated By: Joretta Bachelor, M.D.   Dg Abd Portable 1v  10/20/2011  *RADIOLOGY REPORT*  Clinical Data: Panda tube placement   PORTABLE ABDOMEN - 1 VIEW  Comparison: 09/09/2009  Findings: There is a NG feeding tube coiled within stomach with tip in proximal stomach.  IMPRESSION: NG feeding tube coiled within stomach with tip in proximal stomach.  Original Report Authenticated By: Lahoma Crocker, M.D.   Dg Addison Bailey G Tube Plc W/fl W/rad  10/21/2011  *RADIOLOGY REPORT*  Clinical Data: Placement of feeding tube  Comparison:  Abdomen film of 10/21/2011 - - - 3.44-minute of fluoroscopy time  Findings: Using fluoroscopy the feeding tube was advanced through the stomach with the tip placed in the descending duodenum, documented by injection of a small amount of contrast.  The patient tolerated the procedure well.  IMPRESSION: Feeding tube placed with tip in the descending duodenum.  Original Report Authenticated By: Joretta Bachelor, M.D.    Scheduled:   . aspirin  300 mg Rectal Daily  . capsaicin   Topical BID  . cyanocobalamin  1,000 mcg Intramuscular Q30 days  . darbepoetin (ARANESP) injection - NON-DIALYSIS  100 mcg Subcutaneous Q Mon-1800  . free water  200 mL Per Tube Q8H  . heparin  5,000 Units Subcutaneous Q8H  . hydrALAZINE  5 mg Intravenous Q8H  . insulin aspart  0-15 Units Subcutaneous Q4H  . insulin glargine  15 Units Subcutaneous QHS  . levalbuterol  0.63 mg Nebulization BID  . pantoprazole (PROTONIX) IV  40 mg Intravenous QHS  . prednisoLONE acetate  1 drop Both Eyes Daily  . sodium chloride  3 mL Intravenous Q12H  . DISCONTD: insulin aspart  0-15 Units Subcutaneous TID WC  . DISCONTD: insulin aspart  0-5 Units Subcutaneous QHS      LOS: 20 days   Torsha Lemus C 10/22/2011,10:06 AM

## 2011-10-22 NOTE — Progress Notes (Signed)
Occupational Therapy Treatment Patient Details Name: Eric [name] MRN: [id number] DOB: Mar 03, 1924 Today's Date: 10/22/2011 Time: 0945-1000 OT Time Calculation (min): 15 min  OT Assessment / Plan / Recommendation Comments on Treatment Session Pt. with decreased arousal today and continues to decline functionally. Pt. not making progress at this time and will need to revise goals and or discontinue services based on palliative care meeting on sunday 7/7 as to family and pt's wishes for continued therapy services. MD please advise as to wishes for continued therapy services.    Follow Up Recommendations  Skilled nursing facility       Equipment Recommendations  Defer to next venue       Frequency Min 1X/week   Plan Discharge plan remains appropriate    Precautions / Restrictions Precautions Precautions: Fall Restrictions Weight Bearing Restrictions: No       ADL  Grooming: Performed;Wash/dry face;Maximal assistance (hand over hand assist to facilitate) Where Assessed - Grooming: Supported sitting ADL Comments: pt. with decreased participation today and unable to maintain arousal throughout session. max verbal and tactile cues provided throughout to engage pt.       OT Goals Acute Rehab OT Goals OT Goal Formulation: With patient Time For Goal Achievement: 10/28/11 Potential to Achieve Goals: Fair ADL Goals Pt Will Perform Grooming: with set-up;with supervision;Standing at sink ADL Goal: Grooming - Progress: Not progressing Additional ADL Goal #1: Pt. will recall 3 energy conservation techniques with ADLs ADL Goal: Additional Goal #1 - Progress: Not progressing  Visit Information  Last OT Received On: 10/22/11 Assistance Needed: +2          Cognition  Overall Cognitive Status: Difficult to assess Area of Impairment: Attention;Memory;Following commands Difficult to assess due to: Level of arousal Arousal/Alertness: Lethargic Orientation Level: Disoriented  to;Place;Time Behavior During Session: Lethargic Current Attention Level: Focused Attention - Other Comments: Needs repeated redirection to task.  Lethargy impacting attention as well. Memory: Decreased recall of precautions Following Commands: Follows one step commands inconsistently;Follows one step commands with increased time    Mobility Bed Mobility Bed Mobility: Rolling Right;Right Sidelying to Sit;Sit to Supine;Scooting to [facility] Rolling Right: 1: +2 Total assist Rolling Right: Patient Percentage: 20% Right Sidelying to Sit: 1: +2 Total assist Right Sidelying to Sit: Patient Percentage: 20% Sitting - Scoot to Edge of Bed: 1: +2 Total assist Sitting - Scoot to Edge of Bed: Patient Percentage: 20% Sit to Supine: 1: +2 Total assist Sit to Supine: Patient Percentage: 0% Scooting to HOB: 1: +2 Total assist Scooting to [facility]: Patient Percentage: 0% Details for Bed Mobility Assistance: +2 assist for all bed mobility.  Patient did open eyes with therapists initiated mobility. Transfers Transfers: Not assessed      Balance Balance Balance Assessed: Yes Static Sitting Balance Static Sitting - Balance Support: Bilateral upper extremity supported;Feet supported Static Sitting - Level of Assistance: 2: Max assist Static Sitting - Comment/# of Minutes: Patient sat with max support x 3 minutes.  Was able to maintain static balance x 2-3 seconds x 3 during sitting.  Fatigued quickly.  End of Session OT - End of Session Equipment Utilized During Treatment: Gait belt Activity Tolerance: Patient limited by fatigue Patient left: in chair;with call bell/phone within reach;with nursing in room Nurse Communication: Mobility status       Lavona Mound, OTR/L Pager [phone] 10/22/2011, 2:19 PM

## 2011-10-22 NOTE — Progress Notes (Signed)
Patient ID: PLEAS NEWLON  male  [id number]    DOB: October 13, 1923    DOA: 10/02/2011  PCP: Renato Shin, MD  Brief history of present illness Brief Summary  Eric [name] is an 78 y.o. male with history of hypertension, diabetes, chronic renal insufficiency, hypertension, elevated PSA, lived alone and was actively working, presented to the urgent care clinic complaining of bilateral pedal edema along with been feeling weak on 10/02/2011.  Evaluation showed hyponatremia with serum sodium of 122, creatinine of 1.5, bicarbonate of 19, chloride of 89, hemoglobin 11.4.  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 Per renal service, mild hypercalcemia probably secondary to being bedridden and being on vitamin D. He was also noted to have pneumonia on LLL consolidation on CXR and CT abd, hence treated with Levaquin. Patient however continued to decline progressively, neurology was also consulted on 10/18/2011 for altered mental status. MRI was obtained which showed generalized brain atrophy with mild chronic small vessel changes but no acute intracranial findings. Patient over the past few days as progressively declined. Neurology recommended to keep all sedating medications to minimum and continue to treat metabolic abnormalities and antibiotics. Physical therapy evaluations recommended skilled nursing facility. Patient's oral intake continued to progressively decline and this was discussed with son. Palliative medicine consult was placed on 10/20/2011, Patient was made DNR. Patient's family requested a trial of panda tube feedings before pursuing total Comfort Care approach. After several attempts, NG tube was placed and patient was started on tube feedings on 10/21/2011. Patient's mental status has progressively worsened and currently is almost unresponsive. He is  tolerating the tube feeds so far. Palliative meeting for re [place] is scheduled on Sunday, 10/24/2011.    Subjective: Patient appears to be more lethargic today, unable to obtain review of system from the patient, tolerating tube feeds, almost unresponsive.  Objective: Weight change: 0.1 kg (3.5 oz)  Intake/Output Summary (Last 24 hours) at 10/22/11 1301 Last data filed at 10/22/11 0801  Gross per 24 hour  Intake    572 ml  Output   1375 ml  Net   -803 ml   Blood pressure 140/88, pulse 94, temperature 97.8 F (36.6 C), temperature source Axillary, resp. rate 20, height 5\' 10"  (1.778 m), weight 58.9 kg (129 lb 13.6 oz), SpO2 99.00%.  Physical Exam: General: Lethargic, not in any acute distress HEENT: anicteric sclera, pupils reactive to light and accommodation, EOMI CVS: S1-S2 clear, no murmur rubs or gallops Chest: clear to auscultation bilaterally, no wheezing, rales or rhonchi Abdomen: soft nontender, nondistended, normal bowel sounds, no organomegaly Extremities: no cyanosis, clubbing, trace edema noted bilaterally Neuro: Does not follow commands  Lab Results: Basic Metabolic Panel:  Lab [id number] 0522 10/21/11 0825  NA 156* 152*  K 3.7 3.7  CL 117* 114*  CO2 29 30  GLUCOSE 107* 137*  BUN 65* 69*  CREATININE 1.47* 1.32  CALCIUM 10.9* 10.7*  MG 2.4 --  PHOS 3.4 --   Liver Function Tests:  Lab 10/17/11 0610 10/16/11 0615  AST 114* 76*  ALT 69* 48  ALKPHOS 172* 195*  BILITOT 0.6 0.4  PROT 6.7 6.2  ALBUMIN 1.3* 1.2*   No results found for this basename: LIPASE:2,AMYLASE:2 in the last 168 hours  Lab 10/18/11 1617  AMMONIA 13   CBC:  Lab 10/22/11 0522 10/21/11 0825  WBC 8.4 8.7  NEUTROABS -- --  HGB 10.4* 9.6*  HCT 32.6* 30.1*  MCV 95.9 95.0  PLT 214 211   Cardiac Enzymes: No results found for this basename: CKTOTAL:3,CKMB:3,CKMBINDEX:3,TROPONINI:3 in the last 168 hours BNP: No components found with this basename: POCBNP:2 CBG:  Lab 10/22/11 1118  10/22/11 0415 10/22/11 0004 10/21/11 2037 10/21/11 0619  GLUCAP 168* 89 125* 125* 135*     Micro Results: No results found for this or any previous visit (from the past 240 hour(s)).  Studies/Results: Ct Abdomen Pelvis Wo Contrast  10/03/2011  *RADIOLOGY REPORT*  Clinical Data: Bilateral pedal edema.  History of chronic renal insufficiency, hypertension, diabetes and elevated PSA level.  CT ABDOMEN AND PELVIS WITHOUT CONTRAST  Technique:  Multidetector CT imaging of the abdomen and pelvis was performed following the standard protocol without intravenous contrast.  Comparison: No priors.  Findings:  Lung Bases: Extensive air space consolidation throughout the left lower lobe with air bronchograms, concerning for pneumonia. Subsegmental atelectasis in the right lower lobe. There is a small hiatal hernia. Heart size is borderline enlarged.  Abdomen/Pelvis:  There are no abnormal calcifications within the collecting system of either kidney, along the course of either ureter, or within the lumen of the urinary bladder.  No hydroureteronephrosis to suggest urinary tract obstruction at this time.  The unenhanced appearance of the liver, pancreas, spleen and bilateral adrenal glands is unremarkable.  There is high attenuation material layering dependently within the lumen of the gallbladder, likely representing biliary sludge.  However, the gallbladder does not appear distended, nor is there pericholecystic fluid or stranding at this time to suggest an acute cholecystitis.  There is a small amount of mesenteric edema and a small volume of ascites.  Atherosclerotic calcifications are noted throughout the abdominal and pelvic vasculature, without definite aneurysm. Postoperative changes of TURP are noted in the prostate gland. Urinary bladder is unremarkable in appearance.  No pneumoperitoneum.  No pathologic distension of bowel.  Normal appendix.  No definite pathologic lymphadenopathy within the abdomen or pelvis.  Just beneath the right inguinal canal there is a 2.9 x 2.4 cm soft tissue attenuation lesion that is suspicious for an incompletely descended testicle.  Musculoskeletal: There are no aggressive appearing lytic or blastic lesions noted in the visualized portions of the skeleton.  IMPRESSION: 1.  Small amount of mesenteric edema and small volume of ascites. 2.  Biliary sludge layering dependently within the lumen of the gallbladder.  No signs of acute cholecystitis at this time. 3.  Normal appendix. 4.  Extensive left lower lobe airspace consolidation most compatible with pneumonia. 5.  Atherosclerosis.  6.  Small hiatal hernia. 7.  Soft tissue lesion just beneath the right inguinal canal suspicious for an incompletely distended testicle, as above.  Original Report Authenticated By: Etheleen Mayhew, M.D.   Dg Chest 2 View  10/10/2011  *RADIOLOGY REPORT*  Clinical Data: Follow-up pneumonia.  Cough and congestion.  CHEST - 2 VIEW  Comparison: 10/06/2011  Findings: The cardiomediastinal silhouette is stable with upper limits normal heart size. Airspace disease/consolidation within the left lower lobe is again noted with probable left pleural effusion. Right basilar airspace disease/atelectasis is identified. There is no evidence of pneumothorax. No other changes identified.  IMPRESSION: Relatively unchanged chest radiograph with continued left lower lobe airspace disease/consolidation and probable effusion.  Stable right basilar atelectasis/airspace disease.  Original Report Authenticated By: Lura Em, M.D.   Dg Chest 2 View  10/03/2011  *RADIOLOGY REPORT*  Clinical Data: Bilateral leg swelling and  pain for one week.  CHEST - 2 VIEW  Comparison: Chest radiograph performed 06/01/2011  Findings: The lungs are well-aerated.  Left lower lobe airspace opacification raises concern for pneumonia.  However, this does not appear to match the patient's clinical history; suggest clinical correlation and further  evaluation as deemed appropriate.  There is no evidence of pleural effusion or pneumothorax.  The heart is normal in size; the mediastinal contour is within normal limits.  No acute osseous abnormalities are seen.  IMPRESSION: Left lower lobe airspace opacity raises concern for pneumonia. However, this does not appear to match the patient's clinical history; suggest clinical correlation and further evaluation as deemed appropriate.  Original Report Authenticated By: Santa Lighter, M.D.   Ct Head Wo Contrast  10/17/2011  *RADIOLOGY REPORT*  Clinical Data: Altered mental status  CT HEAD WITHOUT CONTRAST  Technique:  Contiguous axial images were obtained from the base of the skull through the vertex without contrast.  Comparison: 04/19/2011  Findings: There is diffuse patchy low density throughout the subcortical and periventricular white matter consistent with chronic small vessel ischemic change.  There is prominence of the sulci and ventricles consistent with brain atrophy.  There is no evidence for acute brain infarct, hemorrhage or mass.  The paranasal sinuses and the mastoid air cells are clear.  The skull is intact.  IMPRESSION:  1.  No acute intracranial abnormalities.  Original Report Authenticated By: Angelita Ingles, M.D.   Mr Brain Wo Contrast  10/17/2011  *RADIOLOGY REPORT*  Clinical Data: Altered mental status.  Weakness.  Decreased level of consciousness.  MRI HEAD WITHOUT CONTRAST  Technique:  Multiplanar, multiecho pulse sequences of the brain and surrounding structures were obtained according to standard protocol without intravenous contrast.  Comparison: Head CT same day.  Head CT 04/19/2011  Findings: Diffusion imaging does not show any acute or subacute infarction.  The brainstem is normal.  There is cerebellar atrophy with a few old small vessel cerebellar infarctions.  The cerebral hemispheres show generalized atrophy with mild chronic small vessel change within the deep white matter.   Ventricles are prominent . No cortical or large vessel territory infarction.  No mass lesion, hemorrhage, hydrocephalus or extra-axial collection.  No pituitary mass.  No inflammatory sinus disease.  There is fluid throughout the mastoid air cells bilaterally.  This could be sterile or indicate mastoiditis.  I do not see a nasopharyngeal lesion on either side.  No skull or skull base lesion.  Though the ventriculomegaly may be on the basis of ex vacuo enlargement, in proportion to the generalized atrophy, when I compare the CT scan today and last December, the ventricles appear a few millimeters larger today.  This raises the possibility of an element of hydrocephalus.  IMPRESSION: Generalized brain atrophy.  Mild chronic small vessel change.  No acute or subacute to intracranial finding.  The ventricles are prominent, but this is in proportion to the degree of generalized atrophy. However, I think the ventricles are slightly larger on the CT scan done today when compared to last December.  This raises the possibility of hydrocephalus.  Fluid throughout both mastoid air cell regions.  These could be sterile mastoid effusions or indicate mastoiditis.  Original Report Authenticated By: Jules Schick, M.D.   US Renal  10/11/2011  *RADIOLOGY REPORT*  Clinical Data: Evaluate for potential hydronephrosis.  RENAL/URINARY TRACT ULTRASOUND COMPLETE  Comparison:  CT of the abdomen and pelvis 10/03/2011.  Findings:  Right Kidney:  No hydronephrosis.  Well-preserved  cortex.  Normal size and parenchymal echotexture without focal abnormalities.  11.9 cm in length  Left Kidney:  No hydronephrosis.  Well-preserved cortex.  Normal size and parenchymal echotexture without focal abnormalities.  12.0 cm in length  Bladder:  Moderately distended without focal wall abnormalities.  Incidental imaging through the prostate gland demonstrates prostatomegaly (gland measures 5.7 x 3.8 x 5.3 cm).  IMPRESSION:  1.  No focal renal  abnormalities.  Specifically, no evidence of hydronephrosis. 2.  Prostatomegaly.  Original Report Authenticated By: Etheleen Mayhew, M.D.   Dg Chest Port 1 View  10/06/2011  *RADIOLOGY REPORT*  Clinical Data: Fever, shortness of breath and cough.  PORTABLE CHEST - 1 VIEW  Comparison: Plain films of the chest 06/01/2011 and 10/03/2011.  Findings: Left effusion and airspace disease and markedly worsened. There is new mild appearing right basilar airspace disease.  Heart size is upper normal.  IMPRESSION: Worsened left effusion and airspace disease compatible with progressive pneumonia.  Mild right basilar opacity is also now identified.  Original Report Authenticated By: Arvid Right. Luther Parody, M.D.    Medications: Scheduled Meds:    . aspirin  300 mg Rectal Daily  . capsaicin   Topical BID  . cyanocobalamin  1,000 mcg Intramuscular Q30 days  . darbepoetin (ARANESP) injection - NON-DIALYSIS  100 mcg Subcutaneous Q Mon-1800  . free water  200 mL Per Tube Q8H  . heparin  5,000 Units Subcutaneous Q8H  . hydrALAZINE  5 mg Intravenous Q8H  . insulin aspart  0-15 Units Subcutaneous Q4H  . insulin glargine  15 Units Subcutaneous QHS  . levalbuterol  0.63 mg Nebulization BID  . pantoprazole (PROTONIX) IV  40 mg Intravenous QHS  . prednisoLONE acetate  1 drop Both Eyes Daily  . sodium chloride  3 mL Intravenous Q12H  . DISCONTD: insulin aspart  0-15 Units Subcutaneous TID WC  . DISCONTD: insulin aspart  0-5 Units Subcutaneous QHS   Continuous Infusions:    . dextrose    . feeding supplement (JEVITY 1.2 CAL) 20 mL/hr at 10/21/11 2113  . DISCONTD: 0.45 % NaCl with KCl 20 mEq / L 75 mL (10/21/11 0227)  . DISCONTD: dextrose 75 mL (10/21/11 1632)     Assessment/Plan:   Hypernatremia: - Continue D5W, also receiving free water with the tube feedings   Bilateral LE edema likely due to decompensated acute on chronic diastolic CHF and CKD  -2-D echo 01/13 ejection fraction 55-60%, Grade 1  diastolic dysfunction, no valvular abnormalities  - Appreciate nephrology recommendations, serum IFE ordered   Metabolic encephalopathy: probably worsened from Pneumonia, poor oral intake and now hypernatremia, Head CT and MRI both negative for acute stroke . - Neurology following, continue correcting underlying metabolic abnormalities, EEG results pending, minimum sedating meds   Pneumonia/ Left lower lobe consolidation with effusion on CXR and CT abd pelvis: started IV levaquin 6/19, repeat CXR with increasing L lung consolidation, continue xopenex nebs  - completed 10 days of Levaquin on 6/28   ARF on CKD2: Resolved  - Renal USG with normal parenchyma and no hydronephrosis, M spike on SPEP - Nephrology following, ordered serum IFE  DM (diabetes mellitus), type 1, uncontrolled w/ophthalmic complication  - Controlled, continue Lantus 15 units, moderate sliding scale q 4hrs  Anemia: H&H stable, continue aranesp  HTN: Stable   Dysphagia: DVT Prophylaxis: Subcutaneous heparin  Code Status: DNR  Disposition: Will continue trial of tube feedings over the next couple of days, goals of care meeting rescheduled on Sunday,  10/24/2011.  LOS: 55 days   Daleyssa Loiselle M.D. [facility] 10/22/2011, 1:01 PM Pager: [phone]  If 7PM-7AM, please contact night-coverage [email or web address] Password [email or web address]

## 2011-10-22 NOTE — Progress Notes (Signed)
Subjective: Patient seen and examined, now unresponsive. Started on NG tube feeds last night. Also has elevated sodium, now off iv fluids, patient is getting free water via ng tube.  Filed Vitals:   10/22/11 0610  BP: 140/88  Pulse: 94  Temp: 97.8 F (36.6 C)  Resp: 20    Chest: Clear Bilaterally Heart : S1S2 RRR Abdomen: Soft, nontender Ext : No edema Neuro: unresponsive, not following commands  A/P Metabolic encephalopathy Patient is now declining, will continue to monitor on ng tube feeds. Discussed with son at bedside, re goals meeting scheduled on Sunday 10/24/11 at 1 pm.     Oswald Hillock Palliative Medicine Team Pager- [phone]

## 2011-10-22 NOTE — Progress Notes (Signed)
Physical Therapy Treatment Patient Details Name: Eric [name] MRN: [id number] DOB: 04-29-1923 Today's Date: 10/22/2011 Time: [id number] PT Time Calculation (min): 14 min  PT Assessment / Plan / Recommendation Comments on Treatment Session  Patient continued to decline with mobility.  More lethargic today.  Patient with only minimal participation during session today.  Palliative Care conference is scheduled for 7/7 for GOC.  Will update PT plan of care based upon outcome of [place] meeting.    Follow Up Recommendations  Skilled nursing facility    Barriers to Discharge        Equipment Recommendations  Defer to next venue    Recommendations for Other Services    Frequency Min 2X/week   Plan Discharge plan remains appropriate;Frequency remains appropriate    Precautions / Restrictions Precautions Precautions: Fall Restrictions Weight Bearing Restrictions: No       Mobility  Bed Mobility Bed Mobility: Rolling Right;Right Sidelying to Sit;Sit to Supine;Scooting to [facility] Rolling Right: 1: +2 Total assist Rolling Right: Patient Percentage: 20% Right Sidelying to Sit: 1: +2 Total assist Right Sidelying to Sit: Patient Percentage: 20% Sitting - Scoot to Edge of Bed: 1: +2 Total assist Sitting - Scoot to Edge of Bed: Patient Percentage: 20% Sit to Supine: 1: +2 Total assist Sit to Supine: Patient Percentage: 0% Scooting to HOB: 1: +2 Total assist Scooting to [facility]: Patient Percentage: 0% Details for Bed Mobility Assistance: +2 assist for all bed mobility.  Patient did open eyes with therapists initiated mobility.      PT Goals Acute Rehab PT Goals PT Goal: Supine/Side to Sit - Progress: Not progressing PT Goal: Sit at [facility] Of Bed - Progress: Not progressing PT Goal: Sit to Supine/Side - Progress: Not progressing  Visit Information  Last PT Received On: 10/22/11 Assistance Needed: +2 PT/OT Co-Evaluation/Treatment: Yes    Subjective Data  Subjective: Minimal verbalizations.   Eyes opening when sitting on EOB.   Cognition  Overall Cognitive Status: Difficult to assess Area of Impairment: Attention;Memory;Following commands Difficult to assess due to: Level of arousal Arousal/Alertness: Lethargic Orientation Level: Disoriented to;Place;Time Behavior During Session: Lethargic Current Attention Level: Focused Attention - Other Comments: Needs repeated redirection to task.  Lethargy impacting attention as well. Following Commands: Follows one step commands inconsistently;Follows one step commands with increased time    Balance  Balance Balance Assessed: Yes Static Sitting Balance Static Sitting - Balance Support: Bilateral upper extremity supported;Feet supported Static Sitting - Level of Assistance: 2: Max assist Static Sitting - Comment/# of Minutes: Patient sat with max support x 3 minutes.  Was able to maintain static balance x 2-3 seconds x 3 during sitting.  Fatigued quickly.  End of Session PT - End of Session Activity Tolerance: Patient limited by fatigue Patient left: in bed;with call bell/phone within reach Nurse Communication: Mobility status;Need for lift equipment   GP     Despina Pole 10/22/2011, 10:38 AM Carita Pian. Sanjuana Kava, [place] Pager [phone]

## 2011-10-23 ENCOUNTER — Inpatient Hospital Stay (HOSPITAL_COMMUNITY): Payer: Medicare Other

## 2011-10-23 LAB — GLUCOSE, CAPILLARY
Glucose-Capillary: 112 mg/dL — ABNORMAL HIGH (ref 70–99)
Glucose-Capillary: 126 mg/dL — ABNORMAL HIGH (ref 70–99)

## 2011-10-23 LAB — BASIC METABOLIC PANEL
BUN: 49 mg/dL — ABNORMAL HIGH (ref 6–23)
CO2: 25 mEq/L (ref 19–32)
Calcium: 10.2 mg/dL (ref 8.4–10.5)
Creatinine, Ser: 1.27 mg/dL (ref 0.50–1.35)
GFR calc non Af Amer: 50 mL/min — ABNORMAL LOW (ref >90)
Glucose, Bld: 156 mg/dL — ABNORMAL HIGH (ref 70–99)

## 2011-10-23 LAB — CBC
HCT: 28.6 % — ABNORMAL LOW (ref 39.0–52.0)
Hemoglobin: 9.1 g/dL — ABNORMAL LOW (ref 13.0–17.0)
MCH: 31.2 pg (ref 26.0–34.0)
MCHC: 31.8 g/dL (ref 30.0–36.0)
MCV: 97.9 fL (ref 78.0–100.0)
RBC: 2.92 MIL/uL — ABNORMAL LOW (ref 4.22–5.81)

## 2011-10-23 NOTE — Progress Notes (Signed)
Pt's tube feeding was advanced from 40cc/hr to 50cc/hr. Pt did not have any residue prior to advancing tube feeding. Will continue to monitor pt.-----Rami Budhu, Starlee Corralejo, rn

## 2011-10-23 NOTE — Progress Notes (Signed)
Subjective: Patient seen, more alert today, communicating. Has been getting d5w at 100 ml/hr for hypernatremia.  Filed Vitals:   10/23/11 0529  BP: 142/62  Pulse: 69  Temp: 98.2 F (36.8 C)  Resp: 18    Chest: Clear Bilaterally Heart : S1S2 RRR Abdomen: Soft, nontender Ext : No edema Neuro: Alert, communicating  A/P Altered mental status: Improving  Hypernatremia On D5w Renal following  Family re meet on Sunday at 1 pm      [place] Team Pager- [phone]

## 2011-10-23 NOTE — Progress Notes (Addendum)
Assessment:  1. Hypernatremia-  Improved, on D5W  2. Acute on CKD- creat back to baseline, resolved, due to vol depletion.  3. Hypercalcemia , recurrent & severe based upon sAlbumin 1.3 on 6/30 4  M-spike on SPEP from 6/25. Could have myeloma, but poor candidate for aggressive therapy 5. Dementia- poor prognosis  Rec:  No further suggestions, will sign off.   Kelly Splinter  MD [facility] [phone] pgr    [phone] cell 10/23/2011, 11:55 AM   Subjective: Interval History: Did not get much free H2o.  Objective: Vital signs in last 24 hours: Temp:  [97.5 F (36.4 C)-98.2 F (36.8 C)] 98.2 F (36.8 C) (07/06 0529) Pulse Rate:  [69-121] 69  (07/06 0529) Resp:  [16-18] 18  (07/06 0529) BP: (142-170)/(62-80) 142/62 mmHg (07/06 0529) SpO2:  [93 %-98 %] 97 % (07/06 0529) Weight:  [60.4 kg (133 lb 2.5 oz)] 60.4 kg (133 lb 2.5 oz) (07/06 0529) Weight change: 1.5 kg (3 lb 4.9 oz)  Intake/Output from previous day: 07/05 0701 - 07/06 0700 In: 1830 [I.V.:1100; NG/GT:720; IV Piggyback:10] Out: 1000 [Urine:1000] Intake/Output this shift:    PE: Thin man, lethargic and weak Lungs clear Cor tachycardic No edema  Lab Results:  Basename 10/23/11 0500 10/22/11 0522  WBC 8.5 8.4  HGB 9.1* 10.4*  HCT 28.6* 32.6*  PLT 196 214   BMET:   Basename 10/23/11 0500 10/22/11 0522  NA 148* 156*  K 3.8 3.7  CL 114* 117*  CO2 25 29  GLUCOSE 156* 107*  BUN 49* 65*  CREATININE 1.27 1.47*  CALCIUM 10.2 10.9*   No results found for this basename: PTH:2 in the last 72 hours Iron Studies: No results found for this basename: IRON,TIBC,TRANSFERRIN,FERRITIN in the last 72 hours Studies/Results: Dg Abd Portable 1v  10/22/2011  *RADIOLOGY REPORT*  Clinical Data: Confirm NG tube placement.  PORTABLE ABDOMEN - 1 VIEW  Comparison: 10/21/2011  Findings: NG tube is in place with the tip in the proximal to mid stomach.  The large amount stool and gas throughout the colon.  No evidence of  bowel obstruction.  No organomegaly.  IMPRESSION: NG tube tip in the proximal to mid stomach.  Original Report Authenticated By: Raelyn Number, M.D.   Dg Abd Portable 1v  10/21/2011  *RADIOLOGY REPORT*  Clinical Data: NG tube placement.  PORTABLE ABDOMEN - 1 VIEW  Comparison: 10/21/2011.  Findings: Nasogastric tube tip gastric antrum level.  Residual contrast within the portions of the bowel.  Overall nonspecific bowel gas pattern.  The possibility of free intraperitoneal air cannot be addressed on a supine view.  Basilar lung with parenchymal changes more notable on the left incompletely assessed on the present exam.  Chest x-ray can be obtained for further delineation.  IMPRESSION: Nasogastric tube tip gastric antrum level.  Basilar lung with parenchymal changes more notable on the left incompletely assessed on the present exam.  Chest x-ray can be obtained for further delineation.  Original Report Authenticated By: Doug Sou, M.D.   Dg Abd Portable 1v  10/21/2011  *RADIOLOGY REPORT*  Clinical Data: Adjustment of the feeding tube  PORTABLE ABDOMEN - 1 VIEW  Comparison: 10/21/2011  Findings: A NG feeding tube is again noted coiled within stomach. The tip of the NG tube has returned in the mid esophagus about 8 cm above the GE junction.  IMPRESSION: NG feeding tube coiled within stomach with tip coiled back in the esophagus about 8 cm above the GE junction.  This was made a call report to [place] Report Authenticated By: Lahoma Crocker, M.D.   Dg [facility]ison Bailey G Tube Plc W/fl W/rad  10/21/2011  *RADIOLOGY REPORT*  Clinical Data: Placement of feeding tube  Comparison:  Abdomen film of 10/21/2011 - - - 3.44-minute of fluoroscopy time  Findings: Using fluoroscopy the feeding tube was advanced through the stomach with the tip placed in the descending duodenum, documented by injection of a small amount of contrast.  The patient tolerated the procedure well.  IMPRESSION: Feeding tube placed with tip in the  descending duodenum.  Original Report Authenticated By: Joretta Bachelor, M.D.    Scheduled:    . aspirin  300 mg Rectal Daily  . capsaicin   Topical BID  . cyanocobalamin  1,000 mcg Intramuscular Q30 days  . darbepoetin (ARANESP) injection - NON-DIALYSIS  100 mcg Subcutaneous Q Mon-1800  . free water  200 mL Per Tube Q8H  . heparin  5,000 Units Subcutaneous Q8H  . hydrALAZINE  5 mg Intravenous Q8H  . insulin aspart  0-15 Units Subcutaneous Q4H  . insulin glargine  15 Units Subcutaneous QHS  . pantoprazole (PROTONIX) IV  40 mg Intravenous QHS  . prednisoLONE acetate  1 drop Both Eyes Daily  . sodium chloride  3 mL Intravenous Q12H  . DISCONTD: levalbuterol  0.63 mg Nebulization BID      LOS: 21 days   Samreen Seltzer D 10/23/2011,11:52 AM

## 2011-10-23 NOTE — Progress Notes (Signed)
Pt removed NG tube from rt nares with soft wrist restraint in place.

## 2011-10-23 NOTE — Progress Notes (Signed)
Patient ID: Eric [name]  male  [id number]    DOB: October 28, 1923    DOA: 10/02/2011  PCP: Renato Shin, MD  Brief history of present illness Brief Summary  Eric [name] is an 78 y.o. male with history of hypertension, diabetes, chronic renal insufficiency, hypertension, elevated PSA, lived alone and was actively working, presented to the urgent care clinic complaining of bilateral pedal edema along with been feeling weak on 10/02/2011.  Evaluation showed hyponatremia with serum sodium of 122, creatinine of 1.5, bicarbonate of 19, chloride of 89, hemoglobin 11.4.  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 Per renal service, mild hypercalcemia probably secondary to being bedridden and being on vitamin D. He was also noted to have pneumonia on LLL consolidation on CXR and CT abd, hence treated with Levaquin. Patient however continued to decline progressively, neurology was also consulted on 10/18/2011 for altered mental status. MRI was obtained which showed generalized brain atrophy with mild chronic small vessel changes but no acute intracranial findings. Patient over the past few days as progressively declined. Neurology recommended to keep all sedating medications to minimum and continue to treat metabolic abnormalities and antibiotics. Physical therapy evaluations recommended skilled nursing facility. Patient's oral intake continued to progressively decline and this was discussed with son. Palliative medicine consult was placed on 10/20/2011, Patient was made DNR. Patient's family requested a trial of panda tube feedings before pursuing total Comfort Care approach. After several attempts, NG tube was placed and patient was started on tube feedings on 10/21/2011. Patient's mental status has progressively worsened and currently is almost unresponsive. He is  tolerating the tube feeds so far. Palliative meeting for re [place] is scheduled on Sunday, 10/24/2011.  10/23/11: Patient is now on goal rate of tube feeds at 50 cc/hour, tolerating. Also on free water 200 cc q8hrs, D5W at 100 cc/hr. Decreased IVF to 75cc/hr.      Subjective: Patient much more alert, awake and conversive today. Denies any pain anywhere. Tolerating tube feeds. No fevers or chills or acute issues overnight.  Objective: Weight change: 1.5 kg (3 lb 4.9 oz)  Intake/Output Summary (Last 24 hours) at 10/23/11 1042 Last data filed at 10/23/11 0700  Gross per 24 hour  Intake   1830 ml  Output    300 ml  Net   1530 ml   Blood pressure 142/62, pulse 69, temperature 98.2 F (36.8 C), temperature source Axillary, resp. rate 18, height 5\' 10"  (1.778 m), weight 60.4 kg (133 lb 2.5 oz), SpO2 97.00%.  Physical Exam: General: Alert and awake, communicating HEENT: anicteric sclera, pupils reactive to light and accommodation, EOMI CVS: S1-S2 clear, no murmur rubs or gallops Chest: clear to auscultation bilaterally, anteriorly  Abdomen: soft nontender, nondistended, normal bowel sounds, no organomegaly Extremities: no cyanosis, clubbing, trace edema noted bilaterally  Lab Results: Basic Metabolic Panel:  Lab [id number] 0500 10/22/11 0522  NA 148* 156*  K 3.8 3.7  CL 114* 117*  CO2 25 29  GLUCOSE 156* 107*  BUN 49* 65*  CREATININE 1.27 1.47*  CALCIUM 10.2 10.9*  MG -- 2.4  PHOS -- 3.4   Liver Function Tests:  Lab 10/17/11 0610  AST 114*  ALT 69*  ALKPHOS 172*  BILITOT 0.6  PROT 6.7  ALBUMIN 1.3*   Lab 10/18/11 1617  AMMONIA 13   CBC:  Lab 10/23/11 0500 10/22/11 0522  WBC 8.5 8.4  NEUTROABS -- --  HGB 9.1* 10.4*  HCT 28.6* 32.6*  MCV 97.9 95.9  PLT 196 214    CBG:  Lab 10/23/11 0435 10/23/11 0015 10/22/11 1947 10/22/11 1622 10/22/11 1118  GLUCAP 126* 112* 230* 232* 168*     Micro Results: No results found for this or any previous visit (from the past 240  hour(s)).  Studies/Results: Ct Abdomen Pelvis Wo Contrast  10/03/2011   IMPRESSION: 1.  Small amount of mesenteric edema and small volume of ascites. 2.  Biliary sludge layering dependently within the lumen of the gallbladder.  No signs of acute cholecystitis at this time. 3.  Normal appendix. 4.  Extensive left lower lobe airspace consolidation most compatible with pneumonia. 5.  Atherosclerosis.  6.  Small hiatal hernia. 7.  Soft tissue lesion just beneath the right inguinal canal suspicious for an incompletely distended testicle, as above.  Original Report Authenticated By: Etheleen Mayhew, M.D.   Dg Chest 2 View  10/10/2011  *RADIOLOGY REPORT*  Clinical Data: Follow-up pneumonia.  Cough and congestion.  CHEST - 2 VIEW  Comparison: 10/06/2011  Findings: The cardiomediastinal silhouette is stable with upper limits normal heart size. Airspace disease/consolidation within the left lower lobe is again noted with probable left pleural effusion. Right basilar airspace disease/atelectasis is identified. There is no evidence of pneumothorax. No other changes identified.  IMPRESSION: Relatively unchanged chest radiograph with continued left lower lobe airspace disease/consolidation and probable effusion.  Stable right basilar atelectasis/airspace disease.  Original Report Authenticated By: Lura Em, M.D.   Dg Chest 2 View  10/03/2011  IMPRESSION: Left lower lobe airspace opacity raises concern for pneumonia. However, this does not appear to match the patient's clinical history; suggest clinical correlation and further evaluation as deemed appropriate.  Original Report Authenticated By: Santa Lighter, M.D.   Ct Head Wo Contrast  10/17/2011   IMPRESSION:  1.  No acute intracranial abnormalities.  Original Report Authenticated By: Angelita Ingles, M.D.   Mr Brain Wo Contrast  10/17/2011 IMPRESSION: Generalized brain atrophy.  Mild chronic small vessel change.  No acute or subacute to intracranial  finding.  The ventricles are prominent, but this is in proportion to the degree of generalized atrophy. However, I think the ventricles are slightly larger on the CT scan done today when compared to last December.  This raises the possibility of hydrocephalus.  Fluid throughout both mastoid air cell regions.  These could be sterile mastoid effusions or indicate mastoiditis.  Original Report Authenticated By: Jules Schick, M.D.   US Renal  10/11/2011  IMPRESSION:  1.  No focal renal abnormalities.  Specifically, no evidence of hydronephrosis. 2.  Prostatomegaly.  Original Report Authenticated By: Etheleen Mayhew, M.D.   Dg Chest Port 1 View  10/06/2011  IMPRESSION: Worsened left effusion and airspace disease compatible with progressive pneumonia.  Mild right basilar opacity is also now identified.  Original Report Authenticated By: Arvid Right. Luther Parody, M.D.    Medications: Scheduled Meds:    . aspirin  300 mg Rectal Daily  . capsaicin   Topical BID  . cyanocobalamin  1,000 mcg Intramuscular Q30 days  . darbepoetin (ARANESP) injection - NON-DIALYSIS  100 mcg Subcutaneous Q Mon-1800  . free water  200 mL Per Tube Q8H  . heparin  5,000 Units Subcutaneous Q8H  . hydrALAZINE  5 mg Intravenous Q8H  . insulin aspart  0-15 Units Subcutaneous Q4H  . insulin glargine  15 Units Subcutaneous QHS  .  pantoprazole (PROTONIX) IV  40 mg Intravenous QHS  . prednisoLONE acetate  1 drop Both Eyes Daily  . sodium chloride  3 mL Intravenous Q12H  . DISCONTD: levalbuterol  0.63 mg Nebulization BID   Continuous Infusions:    . dextrose 100 mL/hr at 10/22/11 2229  . feeding supplement (JEVITY 1.2 CAL) 1,000 mL (10/22/11 1321)     Assessment/Plan:   Hypernatremia: Improving, but 148 today (<- 156) - Continue D5W, decrease the rate down to 75cc/hr as patient is on goal rate of feeds and also receiving free water with the tube feedings. - Renal service following  Bilateral LE edema likely due to  decompensated acute on chronic diastolic CHF and CKD  - 2-D echo 01/13 ejection fraction 0000000, Grade 1 diastolic dysfunction, no valvular abnormalities  - Appreciate nephrology recommendations, serum IFE ordered   Metabolic encephalopathy: probably worsened from Pneumonia, poor oral intake and now hypernatremia, Head CT and MRI both negative for acute stroke, now slowly improving. - Per neurology recommendations, minimize sedating medications   Pneumonia/ Left lower lobe consolidation with effusion: started IV levaquin 6/19, repeat CXR with increasing L lung consolidation - on PRN xopenex nebs, completed 10 days of Levaquin on 6/28   ARF on CKD2: Resolved  - Renal USG with normal parenchyma and no hydronephrosis, M spike on SPEP - Nephrology following, ordered serum IFE  DM (diabetes mellitus), type 1, uncontrolled w/ophthalmic complication  - Controlled, continue Lantus 15 units, moderate sliding scale q 4hrs  Anemia: H&H stable, continue aranesp  HTN: Stable   Dysphagia: On tube feeds, currently at goal rate of 50 cc an hour  DVT Prophylaxis: Subcutaneous heparin  Code Status: DNR  Disposition: Will continue trial of tube feedings, patient is much more alert, awake and communicative, goals of care meeting rescheduled on Sunday, 10/24/2011.   LOS: 21 days   RAI,RIPUDEEP M.D. [facility] 10/23/2011, 10:42 AM Pager: [phone]  If 7PM-7AM, please contact night-coverage [email or web address] Password [email or web address]

## 2011-10-24 DIAGNOSIS — E870 Hyperosmolality and hypernatremia: Secondary | ICD-10-CM

## 2011-10-24 LAB — GLUCOSE, CAPILLARY
Glucose-Capillary: 116 mg/dL — ABNORMAL HIGH (ref 70–99)
Glucose-Capillary: 122 mg/dL — ABNORMAL HIGH (ref 70–99)
Glucose-Capillary: 158 mg/dL — ABNORMAL HIGH (ref 70–99)

## 2011-10-24 LAB — CBC
Hemoglobin: 9 g/dL — ABNORMAL LOW (ref 13.0–17.0)
MCH: 31.1 pg (ref 26.0–34.0)
MCHC: 32.5 g/dL (ref 30.0–36.0)
RDW: 15.5 % (ref 11.5–15.5)

## 2011-10-24 LAB — BASIC METABOLIC PANEL
Calcium: 9.5 mg/dL (ref 8.4–10.5)
GFR calc Af Amer: 66 mL/min — ABNORMAL LOW (ref >90)
GFR calc non Af Amer: 57 mL/min — ABNORMAL LOW (ref >90)
Glucose, Bld: 170 mg/dL — ABNORMAL HIGH (ref 70–99)
Potassium: 3.4 mEq/L — ABNORMAL LOW (ref 3.5–5.1)
Sodium: 142 mEq/L (ref 135–145)

## 2011-10-24 MED ORDER — POTASSIUM CHLORIDE 10 MEQ/100ML IV SOLN
10.0000 meq | INTRAVENOUS | Status: AC
  Administered 2011-10-24 (×3): 10 meq via INTRAVENOUS
  Filled 2011-10-24 (×3): qty 100

## 2011-10-24 NOTE — Progress Notes (Signed)
Subjective: Patient seen, more alert today, communicating. Family at bedside.  Filed Vitals:   10/24/11 1356  BP: 144/67  Pulse: 94  Temp: 98.8 F (37.1 C)  Resp: 18    Chest: Clear Bilaterally Heart : S1S2 RRR Abdomen: Soft, nontender Ext : No edema Neuro: Alert, communicating  A/P Altered mental status: Improving  Hypernatremia Resolved  Palliative Care: Met with family and discussed patient's improving mental status. When asked about long term nutrition, patient said he would like to get long term nutrition via peg tube. At this time continue with panda tube feeds, and try to start po diet in next few days. Patient can go to SNF when medically stable.      Oswald Hillock Palliative Medicine Team [phone]

## 2011-10-24 NOTE — Progress Notes (Signed)
Patient ID: Eric [name]  male  [id number]    DOB: 1923-10-30    DOA: 10/02/2011  PCP: Renato Shin, MD  Brief history of present illness Brief Summary  Eric [name] is an 78 y.o. male with history of hypertension, diabetes, chronic renal insufficiency, hypertension, elevated PSA, lived alone and was actively working, presented to the urgent care clinic complaining of bilateral pedal edema along with been feeling weak on 10/02/2011.  Evaluation showed hyponatremia with serum sodium of 122, creatinine of 1.5, bicarbonate of 19, chloride of 89, hemoglobin 11.4.  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 Per renal service, mild hypercalcemia probably secondary to being bedridden and being on vitamin D. He was also noted to have pneumonia on LLL consolidation on CXR and CT abd, hence treated with Levaquin. Patient however continued to decline progressively, neurology was also consulted on 10/18/2011 for altered mental status. MRI was obtained which showed generalized brain atrophy with mild chronic small vessel changes but no acute intracranial findings. Patient over the past few days as progressively declined. Neurology recommended to keep all sedating medications to minimum and continue to treat metabolic abnormalities and antibiotics. Physical therapy evaluations recommended skilled nursing facility. Patient's oral intake continued to progressively decline and this was discussed with son. Palliative medicine consult was placed on 10/20/2011, Patient was made DNR. Patient's family requested a trial of panda tube feedings before pursuing total Comfort Care approach. After several attempts, NG tube was placed and patient was started on tube feedings on 10/21/2011. Patient's mental status has progressively worsened and currently is almost unresponsive. He is  tolerating the tube feeds so far. Palliative meeting for re [place] is scheduled on Sunday, 10/24/2011.  10/23/11: Patient is now on goal rate of tube feeds at 50 cc/hour, tolerating. Also on free water 200 cc q8hrs, D5W at 100 cc/hr. Decreased IVF to 75cc/hr.     10/24/11: Patient alert and awake, had pulled his NG tube yesterday. Reinserted panda tube last night and Tube Feeds restarted, currently at goal, dec IVF to 50cc/hr.     Subjective: Patient alert, awake and communicative,denies any pain anywhere. No fevers or chills. He is still a per his NG tube, reinserted panda tube and tube feeds restarted.   Objective: Weight change: 0.9 kg (1 lb 15.7 oz)  Intake/Output Summary (Last 24 hours) at 10/24/11 0935 Last data filed at 10/24/11 0420  Gross per 24 hour  Intake      0 ml  Output   1125 ml  Net  -1125 ml   Blood pressure 148/71, pulse 93, temperature 97.9 F (36.6 C), temperature source Axillary, resp. rate 16, height 5\' 10"  (1.778 m), weight 61.3 kg (135 lb 2.3 oz), SpO2 100.00%.  Physical Exam: General: Alert and awake, communicating in low whispers  HEENT: anicteric sclera, pupils reactive to light and accommodation, EOMI CVS: S1-S2 clear, no murmur rubs or gallops Chest: clear to auscultation bilaterally, anteriorly  Abdomen: soft nontender, nondistended, normal bowel sounds, no organomegaly Extremities: no cyanosis, clubbing, trace edema noted bilaterally  Lab Results: Basic Metabolic Panel:  Lab [id number] 0535 10/23/11 0500 10/22/11 0522  NA 142 148* --  K 3.4* 3.8 --  CL 107 114* --  CO2 28 25 --  GLUCOSE 170* 156* --  BUN 36* 49* --  CREATININE 1.14 1.27 --  CALCIUM 9.5 10.2 --  MG -- --  2.4  PHOS -- -- 3.4    Lab 10/18/11 1617  AMMONIA 13   CBC:  Lab 10/24/11 0535 10/23/11 0500  WBC 7.7 8.5  NEUTROABS -- --  HGB 9.0* 9.1*  HCT 27.7* 28.6*  MCV 95.8 97.9  PLT 213 196    CBG:  Lab 10/24/11 0434 10/24/11 0019 10/23/11 2106 10/23/11 0435 10/23/11 0015    GLUCAP 158* 122* 126* 126* 112*     Micro Results: No results found for this or any previous visit (from the past 240 hour(s)).  Studies/Results: Ct Abdomen Pelvis Wo Contrast  10/03/2011   IMPRESSION: 1.  Small amount of mesenteric edema and small volume of ascites. 2.  Biliary sludge layering dependently within the lumen of the gallbladder.  No signs of acute cholecystitis at this time. 3.  Normal appendix. 4.  Extensive left lower lobe airspace consolidation most compatible with pneumonia. 5.  Atherosclerosis.  6.  Small hiatal hernia. 7.  Soft tissue lesion just beneath the right inguinal canal suspicious for an incompletely distended testicle, as above.  Original Report Authenticated By: Etheleen Mayhew, M.D.   Dg Chest 2 View  10/10/2011  *RADIOLOGY REPORT*  Clinical Data: Follow-up pneumonia.  Cough and congestion.  CHEST - 2 VIEW  Comparison: 10/06/2011  Findings: The cardiomediastinal silhouette is stable with upper limits normal heart size. Airspace disease/consolidation within the left lower lobe is again noted with probable left pleural effusion. Right basilar airspace disease/atelectasis is identified. There is no evidence of pneumothorax. No other changes identified.  IMPRESSION: Relatively unchanged chest radiograph with continued left lower lobe airspace disease/consolidation and probable effusion.  Stable right basilar atelectasis/airspace disease.  Original Report Authenticated By: Lura Em, M.D.   Dg Chest 2 View  10/03/2011  IMPRESSION: Left lower lobe airspace opacity raises concern for pneumonia. However, this does not appear to match the patient's clinical history; suggest clinical correlation and further evaluation as deemed appropriate.  Original Report Authenticated By: Santa Lighter, M.D.   Ct Head Wo Contrast  10/17/2011   IMPRESSION:  1.  No acute intracranial abnormalities.  Original Report Authenticated By: Angelita Ingles, M.D.   Mr Brain Wo  Contrast  10/17/2011 IMPRESSION: Generalized brain atrophy.  Mild chronic small vessel change.  No acute or subacute to intracranial finding.  The ventricles are prominent, but this is in proportion to the degree of generalized atrophy. However, I think the ventricles are slightly larger on the CT scan done today when compared to last December.  This raises the possibility of hydrocephalus.  Fluid throughout both mastoid air cell regions.  These could be sterile mastoid effusions or indicate mastoiditis.  Original Report Authenticated By: Jules Schick, M.D.   US Renal  10/11/2011  IMPRESSION:  1.  No focal renal abnormalities.  Specifically, no evidence of hydronephrosis. 2.  Prostatomegaly.  Original Report Authenticated By: Etheleen Mayhew, M.D.   Dg Chest Port 1 View  10/06/2011  IMPRESSION: Worsened left effusion and airspace disease compatible with progressive pneumonia.  Mild right basilar opacity is also now identified.  Original Report Authenticated By: Arvid Right. Luther Parody, M.D.    Medications: Scheduled Meds:    . aspirin  300 mg Rectal Daily  . capsaicin   Topical BID  . cyanocobalamin  1,000 mcg Intramuscular Q30 days  . darbepoetin (ARANESP) injection - NON-DIALYSIS  100 mcg Subcutaneous Q Mon-1800  . free water  200 mL Per Tube Q8H  . heparin  5,000 Units Subcutaneous Q8H  .  hydrALAZINE  5 mg Intravenous Q8H  . insulin aspart  0-15 Units Subcutaneous Q4H  . insulin glargine  15 Units Subcutaneous QHS  . pantoprazole (PROTONIX) IV  40 mg Intravenous QHS  . prednisoLONE acetate  1 drop Both Eyes Daily  . sodium chloride  3 mL Intravenous Q12H   Continuous Infusions:    . dextrose 75 mL/hr at 10/23/11 2018  . feeding supplement (JEVITY 1.2 CAL) 1,000 mL (10/23/11 2130)     Assessment/Plan:   Hypernatremia: Improving, 142 today - Continue D5W, decrease the rate down to 50cc/hr   Hypokalemia : Replaced   Bilateral LE edema likely due to decompensated acute on  chronic diastolic CHF and CKD  - 2-D echo 01/13 ejection fraction 0000000, Grade 1 diastolic dysfunction, no valvular abnormalities   Metabolic encephalopathy: probably worsened from Pneumonia, poor oral intake and now hypernatremia, Head CT and MRI both negative for acute stroke, now slowly improving. - Per neurology recommendations, minimize sedating medications   Pneumonia/ Left lower lobe consolidation with effusion: started IV levaquin 6/19, repeat CXR with increasing L lung consolidation - on PRN xopenex nebs, completed 10 days of Levaquin on 6/28   ARF on CKD2: Resolved  - Renal USG with normal parenchyma and no hydronephrosis, M spike on SPEP - Per nephrology, ordered serum IFE, could have myeloma but poor candidate for aggressive therapy   DM (diabetes mellitus), type 1, uncontrolled w/ophthalmic complication: Hemoglobin A1c 10.6  - Controlled inpatient, continue Lantus 15 units, moderate sliding scale q 4hrs  Anemia: H&H stable, continue aranesp  HTN: Stable   Dysphagia: On tube feeds, currently at goal rate of 50 cc an hour. Palliative GOC meeting today, discussed with Dr. Darrick Meigs in detail. Patient had issues with tube feeding, but overall mental status continues to improve in the last couple of days, so will continue panda tube feeding until meaningful recovery. Hypernatremia is also improving currently with IV fluids.   DVT Prophylaxis: Subcutaneous heparin  Code Status: DNR  Disposition:  not medically ready.   LOS: 50 days   Melven Stockard M.D. [facility] 10/24/2011, 9:35 AM Pager: [phone]  If 7PM-7AM, please contact night-coverage [email or web address] Password [email or web address]

## 2011-10-25 DIAGNOSIS — E870 Hyperosmolality and hypernatremia: Secondary | ICD-10-CM

## 2011-10-25 LAB — CBC
HCT: 30.1 % — ABNORMAL LOW (ref 39.0–52.0)
MCHC: 32.2 g/dL (ref 30.0–36.0)
MCV: 94.1 fL (ref 78.0–100.0)
Platelets: 189 10*3/uL (ref 150–400)
RDW: 15.3 % (ref 11.5–15.5)
WBC: 8.1 10*3/uL (ref 4.0–10.5)

## 2011-10-25 LAB — BASIC METABOLIC PANEL
BUN: 31 mg/dL — ABNORMAL HIGH (ref 6–23)
Creatinine, Ser: 0.99 mg/dL (ref 0.50–1.35)
GFR calc Af Amer: 84 mL/min — ABNORMAL LOW (ref >90)
GFR calc non Af Amer: 73 mL/min — ABNORMAL LOW (ref >90)

## 2011-10-25 LAB — GLUCOSE, CAPILLARY
Glucose-Capillary: 168 mg/dL — ABNORMAL HIGH (ref 70–99)
Glucose-Capillary: 253 mg/dL — ABNORMAL HIGH (ref 70–99)
Glucose-Capillary: 207 mg/dL — ABNORMAL HIGH (ref 70–99)
Glucose-Capillary: 165 mg/dL — ABNORMAL HIGH (ref 70–99)
Glucose-Capillary: 201 mg/dL — ABNORMAL HIGH (ref 70–99)
Glucose-Capillary: 151 mg/dL — ABNORMAL HIGH (ref 70–99)
Glucose-Capillary: 124 mg/dL — ABNORMAL HIGH (ref 70–99)
Glucose-Capillary: 126 mg/dL — ABNORMAL HIGH (ref 70–99)

## 2011-10-25 MED ORDER — FREE WATER
200.0000 mL | Freq: Four times a day (QID) | Status: DC
  Administered 2011-10-25 – 2011-10-26 (×6): 200 mL

## 2011-10-25 NOTE — Progress Notes (Signed)
Subjective: Patient seen, no new complaints.   Filed Vitals:   10/25/11 1441  BP: 146/66  Pulse: 94  Temp:   Resp: 18    Chest: Clear Bilaterally Heart : S1S2 RRR Abdomen: Soft, nontender Ext : No edema Neuro: Somnolent  A/P Altered mental status: Improving  Hypernatremia Resolved  Palliative Care: Met with family on 10/24/11 and discussed patient's improving mental status. When asked about long term nutrition, patient said he would like to get long term nutrition via peg tube. At this time continue with panda tube feeds, and try to start po diet in next few days. Patient can go to SNF when medically stable.      Oswald Hillock Palliative Medicine Team [phone]

## 2011-10-25 NOTE — Clinical Documentation Improvement (Signed)
MALNUTRITION DOCUMENTATION CLARIFICATION  THIS DOCUMENT IS NOT A PERMANENT PART OF THE MEDICAL RECORD  TO RESPOND TO THE THIS QUERY, FOLLOW THE INSTRUCTIONS BELOW:  1. If needed, update documentation for the patient's encounter via the notes activity.  2. Access this query again and click edit on the In [employer].  3. After updating, or not, click F2 to complete all highlighted (required) fields concerning your review. Select "additional documentation in the medical record" OR "no additional documentation provided".  4. Click Sign note button.  5. The deficiency will fall out of your In Basket *Please let us know if you are not able to complete this workflow by phone or e-mail (listed below).  Please update your documentation within the medical record to reflect your response to this query.                                                                                        10/25/11   Dear Dr.Rai / Associates,  In a better effort to capture your patient's severity of illness, reflect appropriate length of stay and utilization of resources, a review of the patient medical record has revealed the following indicators.    Based on your clinical judgment, please clarify and document in a progress note and/or discharge summary the clinical condition associated with the following supporting information:  In responding to this query please exercise your independent judgment.  The fact that a query is asked, does not imply that any particular answer is desired or expected.   Possible Clinical Conditions?   _______Severe Malnutrition   _______Protein Calorie Malnutrition _______Severe Protein Calorie Malnutrition _______Other Condition _______Cannot clinically determine     Supporting Information: Risk Factors: per 10/19/11 progress notes:  Patient with a progressive decline, not eating, pocketing food, not able to swallow, and extremely weak.  Signs & Symptoms: Ht: 5'10"    Wt:  59.6kg  BMI: 18.85  Weight  Loss: 16% weight loss since admission to the hospital.  Treatment:  Panda placement recommended per RD on 7/2 with Jevity 1.2 @ 73ml/hr, to increase by 53ml/hr up to a goal of 77ml/hr Enteral Feeding: Per 7/7 progress notes: ngt placed and tube feeds started 7/4, tolerating tube feeds.  Nutrition Consult: 10/19/11   You may use possible, probable, or suspect with inpatient documentation. possible, probable, suspected diagnoses MUST be documented at the time of discharge  Reviewed:  No additional documentation provided.                                                  Thank You,  Theron Arista,  Clinical Documentation Specialist:  Pager: [phone]

## 2011-10-25 NOTE — Procedures (Signed)
EEG NUMBER [id number].  This routine EEG was requested in this 78 year old man who was admitted with dehydration and altered mental status.  There are no listed anticonvulsant medications.  The EEG was done with the patient awake and drowsy.  During periods of maximal wakefulness, background activities were composed of low amplitude, polymorphic and delta activities.  Note was made of frontally dominant, low frequency beta activities and high-frequency alpha activities best seen at the vertex.  Photic stimulation and hyperventilation were not performed.  The patient did become drowsy as characterized by attenuation of muscle activity, as well as beta activities.  There did seem to be a decrease in the overall amplitude of the tracing, the appearance of mainly polymorphic delta activities.  CLINICAL INTERPRETATION:  This routine EEG done with the patient awake and drowsy is abnormal.  Activities during wakefulness consisted of polymorphic delta activities, suggest moderate encephalopathy of nonspecific etiology.          ______________________________ Neena Rhymes, MD    [id number] D:  10/19/2011 22:50:49  T:  10/19/2011 [id number]  Job #:  [id number]

## 2011-10-25 NOTE — Progress Notes (Signed)
Progress note  10/25/11 1400  PT Visit Information  Last PT Received On 10/25/11  Assistance Needed +2  PT Time Calculation  PT Start Time 1420  PT Stop Time 1455  PT Time Calculation (min) 35 min  Subjective Data  Subjective Minimal verbalizations.  Eyes opening when sitting on EOB.  Precautions  Precautions Fall  Restrictions  Weight Bearing Restrictions No  Cognition  Overall Cognitive Status Difficult to assess (pt lethagic and not answering questions well. )  Bed Mobility  Bed Mobility Rolling Right;Left Sidelying to Sit;Sitting - Scoot to [employer] of Bed;Sit to Sidelying Left  Rolling Right 1: +2 Total assist  Rolling Right: Patient Percentage 20%  Left Sidelying to Sit 1: +2 Total assist;HOB elevated (HOB 30 degrees)  Left Sidelying to Sit: Patient Percentage 20%  Supine to Sit Not tested (comment)  Sitting - Scoot to Edge of Bed 1: +2 Total assist  Sitting - Scoot to Edge of Bed: Patient Percentage 20%  Sit to Sidelying Left 1: +2 Total assist;HOB elevated (HOB 30 degrees. )  Sit to Sidelying Left: Patient Percentage 20%  Scooting to [facility] Not tested (comment)  Details for Bed Mobility Assistance +2 assist for all mobility. Pt attempted to initate movements of LEs but would fatigue and or fall asleep.   Pt presents with poor trunk control   Transfers  Transfers Sit to Stand;Stand to Sit  Sit to Stand 1: +2 Total assist;With upper extremity assist;From bed  Sit to Stand: Patient Percentage 20%  Stand to Sit 1: +2 Total assist;With upper extremity assist;To bed  Stand to Sit: Patient Percentage 20%  Stand Pivot Transfers Not tested (comment)  Details for Transfer Assistance Continuous step by step cueing.  Pt had difficulty standing with RW. Manual facilitation to initiate pivot. Pt presents with flexed trunk posture posture which increased as pt fatigued.  Max assist to control pt's descent to chair.   Ambulation/Gait  Ambulation/Gait Assistance Not tested (comment)    Stairs No  Wheelchair Mobility  Wheelchair Mobility No  Balance  Balance Assessed Yes  Static Sitting Balance  Static Sitting - Balance Support Bilateral upper extremity supported;Feet supported  Static Sitting - Level of Assistance 2: Max assist  Static Sitting - Comment/# of Minutes pt unable to establish balance for more than a few seconds before falling asleep   PT - End of Session  Equipment Utilized During Treatment Gait belt;Oxygen  Activity Tolerance Patient limited by fatigue  Patient left in bed;with call bell/phone within reach;with family/visitor present  Nurse Communication Mobility status;Need for lift equipment  PT - Assessment/Plan  Comments on Treatment Session Pt now on tube feeds. Kept HOB elevated above 30 degrees.  Pt continues to struggle with mobility but did participate more this session.    PT Plan Discharge plan remains appropriate;Frequency remains appropriate  PT Frequency Min 2X/week  Follow Up Recommendations Skilled nursing facility  Equipment Recommended Defer to next venue  Acute Rehab PT Goals  PT Goal Formulation Patient unable to participate in goal setting  Time For Goal Achievement 11/01/11  Potential to Achieve Goals Fair  Pt will go Supine/Side to Sit with min assist;with HOB 0 degrees  PT Goal: Supine/Side to Sit - Progress Progressing toward goal  Pt will Sit at [facility] of Bed with mod assist;3-5 min;with bilateral upper extremity support  PT Goal: Sit at [facility] Of Bed - Progress Progressing toward goal  Pt will go Sit to Supine/Side with min assist;with HOB 0 degrees  PT Goal:  Sit to Supine/Side - Progress Progressing toward goal  Pt will go Sit to Stand with min assist;with upper extremity assist  PT Goal: Sit to Stand - Progress Progressing toward goal  Pt will go Stand to Sit with min assist;with upper extremity assist  PT Goal: Stand to Sit - Progress Progressing toward goal  Pt will Transfer Bed to Chair/Chair to Bed with min assist  PT  Transfer Goal: Bed to Chair/Chair to Bed - Progress Not met  PT Treatments  $Therapeutic Activity 23-37 mins   Jyoti Harju L. Robynne Roat DPT [phone]

## 2011-10-25 NOTE — Progress Notes (Signed)
Patient ID: Eric [name]  male  [id number]    DOB: 1923/08/18    DOA: 10/02/2011  PCP: Renato Shin, MD  Brief history of present illness Brief Summary  Eric [name] is an 78 y.o. male with history of hypertension, diabetes, chronic renal insufficiency, hypertension, elevated PSA, lived alone and was actively working, presented to the urgent care clinic complaining of bilateral pedal edema along with been feeling weak on 10/02/2011.  Evaluation showed hyponatremia with serum sodium of 122, creatinine of 1.5, bicarbonate of 19, chloride of 89, hemoglobin 11.4.  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 Per renal service, mild hypercalcemia probably secondary to being bedridden and being on vitamin D. He was also noted to have pneumonia on LLL consolidation on CXR and CT abd, hence treated with Levaquin. Patient however continued to decline progressively, neurology was also consulted on 10/18/2011 for altered mental status. MRI was obtained which showed generalized brain atrophy with mild chronic small vessel changes but no acute intracranial findings. Patient over the past few days as progressively declined. Neurology recommended to keep all sedating medications to minimum and continue to treat metabolic abnormalities and antibiotics. Physical therapy evaluations recommended skilled nursing facility. Patient's oral intake continued to progressively decline and this was discussed with son. Palliative medicine consult was placed on 10/20/2011, Patient was made DNR. Patient's family requested a trial of panda tube feedings before pursuing total Comfort Care approach. After several attempts, NG tube was placed and patient was started on tube feedings on 10/21/2011. Patient's mental status has progressively worsened and currently is almost unresponsive. He is  tolerating the tube feeds so far. Palliative meeting for re [place] is scheduled on Sunday, 10/24/2011.  10/23/11: Patient is now on goal rate of tube feeds at 50 cc/hour, tolerating. Also on free water 200 cc q8hrs, D5W at 100 cc/hr. Decreased IVF to 75cc/hr.     10/24/11: Patient alert and awake, had pulled his NG tube yesterday. Reinserted panda tube last night and Tube Feeds restarted, currently at goal, dec IVF to 50cc/hr.    10/25/11: Patient communicating appropriately, okay for SNF and artificial feeding. IVF DC'd   Subjective: Patient alert, awake and communicative,denies any pain anywhere. No fevers or chills. Tolerating tube feeds.  Objective: Weight change: 1.6 kg (3 lb 8.4 oz)  Intake/Output Summary (Last 24 hours) at 10/25/11 1525 Last data filed at 10/25/11 1254  Gross per 24 hour  Intake 1494.17 ml  Output   1000 ml  Net 494.17 ml   Blood pressure 146/66, pulse 94, temperature 98.1 F (36.7 C), temperature source Oral, resp. rate 18, height 5\' 10"  (1.778 m), weight 62.9 kg (138 lb 10.7 oz), SpO2 98.00%.  Physical Exam: General: Alert and awake, communicating HEENT: anicteric sclera, pupils reactive to light and accommodation, EOMI CVS: S1-S2 clear, no murmur rubs or gallops Chest: clear to auscultation bilaterally, anteriorly  Abdomen: soft nontender, nondistended, normal bowel sounds, no organomegaly Extremities: no cyanosis, clubbing, trace edema noted bilaterally  Lab Results: Basic Metabolic Panel:  Lab [id number] 10/24/11 0535 10/22/11 0522  NA 137 142 --  K 3.8 3.4* --  CL 104 107 --  CO2 25 28 --  GLUCOSE 180* 170* --  BUN 31* 36* --  CREATININE 0.99 1.14 --  CALCIUM 9.2 9.5 --  MG -- -- 2.4  PHOS -- -- 3.4  Lab 10/18/11 1617  AMMONIA 13   CBC:  Lab 10/25/11 0720 10/24/11 0535  WBC 8.1 7.7  NEUTROABS -- --  HGB 9.7* 9.0*  HCT 30.1* 27.7*  MCV 94.1 95.8  PLT 189 213    CBG:  Lab 10/25/11 1224 10/25/11 0803 10/25/11 0415 10/25/11 10/24/11  2009  GLUCAP 124* 151* 201* 192* 236*     Micro Results: No results found for this or any previous visit (from the past 240 hour(s)).  Studies/Results: Ct Abdomen Pelvis Wo Contrast  10/03/2011   IMPRESSION: 1.  Small amount of mesenteric edema and small volume of ascites. 2.  Biliary sludge layering dependently within the lumen of the gallbladder.  No signs of acute cholecystitis at this time. 3.  Normal appendix. 4.  Extensive left lower lobe airspace consolidation most compatible with pneumonia. 5.  Atherosclerosis.  6.  Small hiatal hernia. 7.  Soft tissue lesion just beneath the right inguinal canal suspicious for an incompletely distended testicle, as above.  Original Report Authenticated By: Etheleen Mayhew, M.D.   Dg Chest 2 View  10/10/2011  *RADIOLOGY REPORT*  Clinical Data: Follow-up pneumonia.  Cough and congestion.  CHEST - 2 VIEW  Comparison: 10/06/2011  Findings: The cardiomediastinal silhouette is stable with upper limits normal heart size. Airspace disease/consolidation within the left lower lobe is again noted with probable left pleural effusion. Right basilar airspace disease/atelectasis is identified. There is no evidence of pneumothorax. No other changes identified.  IMPRESSION: Relatively unchanged chest radiograph with continued left lower lobe airspace disease/consolidation and probable effusion.  Stable right basilar atelectasis/airspace disease.  Original Report Authenticated By: Lura Em, M.D.   Dg Chest 2 View  10/03/2011  IMPRESSION: Left lower lobe airspace opacity raises concern for pneumonia. However, this does not appear to match the patient's clinical history; suggest clinical correlation and further evaluation as deemed appropriate.  Original Report Authenticated By: Santa Lighter, M.D.   Ct Head Wo Contrast  10/17/2011   IMPRESSION:  1.  No acute intracranial abnormalities.  Original Report Authenticated By: Angelita Ingles, M.D.   Mr Brain Wo  Contrast  10/17/2011 IMPRESSION: Generalized brain atrophy.  Mild chronic small vessel change.  No acute or subacute to intracranial finding.  The ventricles are prominent, but this is in proportion to the degree of generalized atrophy. However, I think the ventricles are slightly larger on the CT scan done today when compared to last December.  This raises the possibility of hydrocephalus.  Fluid throughout both mastoid air cell regions.  These could be sterile mastoid effusions or indicate mastoiditis.  Original Report Authenticated By: Jules Schick, M.D.   US Renal  10/11/2011  IMPRESSION:  1.  No focal renal abnormalities.  Specifically, no evidence of hydronephrosis. 2.  Prostatomegaly.  Original Report Authenticated By: Etheleen Mayhew, M.D.   Dg Chest Port 1 View  10/06/2011  IMPRESSION: Worsened left effusion and airspace disease compatible with progressive pneumonia.  Mild right basilar opacity is also now identified.  Original Report Authenticated By: Arvid Right. Luther Parody, M.D.    Medications: Scheduled Meds:    . capsaicin   Topical BID  . cyanocobalamin  1,000 mcg Intramuscular Q30 days  . darbepoetin (ARANESP) injection - NON-DIALYSIS  100 mcg Subcutaneous Q Mon-1800  . free water  200 mL Per Tube Q6H  . heparin  5,000 Units Subcutaneous Q8H  . hydrALAZINE  5 mg Intravenous Q8H  . insulin aspart  0-15 Units Subcutaneous Q4H  . insulin  glargine  15 Units Subcutaneous QHS  . pantoprazole (PROTONIX) IV  40 mg Intravenous QHS  . potassium chloride  10 mEq Intravenous Q1 Hr x 3  . prednisoLONE acetate  1 drop Both Eyes Daily  . sodium chloride  3 mL Intravenous Q12H  . DISCONTD: free water  200 mL Per Tube Q8H   Continuous Infusions:    . feeding supplement (JEVITY 1.2 CAL) 1,000 mL (10/24/11 1844)  . DISCONTD: dextrose 50 mL/hr at 10/24/11 1209     Assessment/Plan:   Hypernatremia: Resolved - DC IV fluids, increased free water 200cc qid.  Hypokalemia :  Resolved   Bilateral LE edema likely due to decompensated acute on chronic diastolic CHF and CKD  - 2-D echo 01/13 ejection fraction 0000000, Grade 1 diastolic dysfunction, no valvular abnormalities   Metabolic encephalopathy: Significantly improved today, probably worsened from Pneumonia, poor oral intake and now hypernatremia, Head CT and MRI both negative for acute stroke, now slowly improving. - Per neurology recommendations, minimize sedating medications   Pneumonia/ Left lower lobe consolidation with effusion: started IV levaquin 6/19, repeat CXR with increasing L lung consolidation - on PRN xopenex nebs, completed 10 days of Levaquin on 6/28   ARF on CKD2: Resolved  - Renal USG with normal parenchyma and no hydronephrosis, M spike on SPEP - Per nephrology, ordered serum IFE, could have myeloma but poor candidate for aggressive therapy   DM (diabetes mellitus), type 1, uncontrolled w/ophthalmic complication: Hemoglobin A1c 10.6  - Controlled inpatient, continue Lantus 15 units, moderate sliding scale q 4hrs  Anemia: H&H stable, continue aranesp  HTN: Stable   Dysphagia: On tube feeds, currently at goal rate of 50 cc an hour. Hypernatremia resolved with IVF.  DVT Prophylaxis: Subcutaneous heparin  Code Status: DNR  Disposition:  Will start trial of oral diet in 1-2 days, if the patient fails, will obtain PEG tube per his wishes.    LOS: 90 days   Renuka Farfan M.D. [facility] 10/25/2011, 3:25 PM Pager: [phone]  If 7PM-7AM, please contact night-coverage [email or web address] Password [email or web address]

## 2011-10-26 LAB — CBC
Hemoglobin: 9 g/dL — ABNORMAL LOW (ref 13.0–17.0)
MCH: 30.7 pg (ref 26.0–34.0)
MCHC: 32.7 g/dL (ref 30.0–36.0)
RDW: 15 % (ref 11.5–15.5)

## 2011-10-26 LAB — GLUCOSE, CAPILLARY: Glucose-Capillary: 146 mg/dL — ABNORMAL HIGH (ref 70–99)

## 2011-10-26 LAB — IMMUNOFIXATION ELECTROPHORESIS: IgG (Immunoglobin G), Serum: 2740 mg/dL — ABNORMAL HIGH (ref 650–1600)

## 2011-10-26 LAB — BASIC METABOLIC PANEL
Calcium: 9.2 mg/dL (ref 8.4–10.5)
GFR calc Af Amer: 86 mL/min — ABNORMAL LOW (ref >90)
GFR calc non Af Amer: 74 mL/min — ABNORMAL LOW (ref >90)
Glucose, Bld: 162 mg/dL — ABNORMAL HIGH (ref 70–99)
Sodium: 136 mEq/L (ref 135–145)

## 2011-10-26 MED ORDER — INSULIN GLARGINE 100 UNIT/ML ~~LOC~~ SOLN
18.0000 [IU] | Freq: Every day | SUBCUTANEOUS | Status: DC
Start: 2011-10-26 — End: 2011-10-27
  Administered 2011-10-26: 18 [IU] via SUBCUTANEOUS

## 2011-10-26 NOTE — Evaluation (Signed)
Clinical/Bedside Swallow Evaluation Patient Details  Name: Eric [name] MRN: [id number] Date of Birth: 1923/06/15  Today's Date: 10/26/2011 Time: [id number] SLP Time Calculation (min): 15 min  Past Medical History:  Past Medical History  Diagnosis Date  . ANEMIA-NOS 02/17/2007  . DEPRESSION 12/25/2008  . HYPERTENSION 02/17/2007  . GERD 02/17/2007  . OSTEOARTHRITIS 03/04/2010  . DIABETES MELLITUS, TYPE I, CONTROLLED, WITH RETINOPATHY   . Gastroparesis   . BENIGN PROSTATIC HYPERTROPHY   . VITAMIN B12 DEFICIENCY   . BACK PAIN, LUMBAR   . LIVER DISORDER   . PSA, INCREASED   . Hyperlipidemia   . Diabetes mellitus   . Pneumonia   . Anxiety   . Encephalopathy, metabolic    Past Surgical History:  Past Surgical History  Procedure Date  . Transurethral resection of prostate    HPI:  78 y.o. male with history of hypertension, diabetes, chronic renal insufficiency, hypertension, elevated PSA, family history of prostate cancers in both brothers, lives alone and is still actively working, complaining of bilateral pedal edema along with been feeling weak.aluation showed hyponatremia with serum sodium of 122, creatinine of 1.5, bicarbonate of 19 and chloride of 89. Patient was admitted to hospital and was followed by nephrology, received iv lasix . His sodium has improved, also has diuresed about 13 liters of fluid. Patient now has altered mental status, most likely due to metabolic encephalopathy. Has poor po intake, discussed with family regarding trial of tube feedings, long term artificial nutrition with peg tube, confirmed the code status, comfort care approach if no improvement.   Assessment / Plan / Recommendation Clinical Impression  RE-evaluation complete. Patient much improved in the area of alertness today. Able to maintain alert state with clinician for 15 minutes for po trials. Vocal quality with low intensity however clear. Po trials revealed intermittent s/s of aspiraiton  characterized by cough post swallow due to supsected delayed swallow initiation with thin liquids, and subtle throat clearing with pureed solids. Clinically, patient appropriate for objective swallowing evaluation. Will proceed with MBS either this pm or 7/10 am.     Aspiration Risk  Moderate    Diet Recommendation NPO   Medication Administration: Via alternative means    Other  Recommendations Oral Care Recommendations: Oral care QID   Follow Up Recommendations  Other (comment) (TBD)       Pertinent Vitals/Pain n/a     Swallow Study    General HPI: 78 y.o. male with history of hypertension, diabetes, chronic renal insufficiency, hypertension, elevated PSA, family history of prostate cancers in both brothers, lives alone and is still actively working, complaining of bilateral pedal edema along with been feeling weak.aluation showed hyponatremia with serum sodium of 122, creatinine of 1.5, bicarbonate of 19 and chloride of 89. Patient was admitted to hospital and was followed by nephrology, received iv lasix . His sodium has improved, also has diuresed about 13 liters of fluid. Patient now has altered mental status, most likely due to metabolic encephalopathy. Has poor po intake, discussed with family regarding trial of tube feedings, long term artificial nutrition with peg tube, confirmed the code status, comfort care approach if no improvement. Type of Study: Bedside swallow evaluation Previous Swallow Assessment: BEdside swallow evaluation complete on this admission on 6/30. Recommended NPO. f/u showed no improvement so SLP signed off.  Diet Prior to this Study: NPO;Panda Temperature Spikes Noted: No Respiratory Status: Supplemental O2 delivered via (comment) (2 L nasal cannula) Behavior/Cognition: Lethargic;Requires cueing Oral Cavity - Dentition: Adequate natural  dentition Self-Feeding Abilities: Able to feed self Patient Positioning: Upright in bed Baseline Vocal Quality: Low vocal  intensity;Clear Volitional Cough: Strong Volitional Swallow: Able to elicit    Oral/Motor/Sensory Function Overall Oral Motor/Sensory Function: Other (comment) (generalized weakness)   Ice Chips Ice chips: Impaired Presentation: Spoon Pharyngeal Phase Impairments: Suspected delayed Swallow   Thin Liquid Thin Liquid: Impaired Presentation: Cup;Self Fed;Spoon Pharyngeal  Phase Impairments: Suspected delayed Swallow;Cough - Immediate    Nectar Thick Nectar Thick Liquid: Not tested   Honey Thick Honey Thick Liquid: Not tested   Puree Puree: Impaired Presentation: Spoon Pharyngeal Phase Impairments: Suspected delayed Swallow;Throat Clearing - Delayed (subtle throat clearing)   Solid Solid: Not tested   Gabriel Rainwater MA, CCC-SLP [phone]  Elai Vanwyk Meryl 10/26/2011,11:38 AM

## 2011-10-26 NOTE — Progress Notes (Signed)
Patient ID: Eric [name]  male  [id number]    DOB: 1924/04/09    DOA: 10/02/2011  PCP: Renato Shin, MD  Brief history of present illness Brief Summary  Eric [name] is an 78 y.o. male with history of hypertension, diabetes, chronic renal insufficiency, hypertension, elevated PSA, lived alone and was actively working, presented to the urgent care clinic complaining of bilateral pedal edema along with been feeling weak on 10/02/2011.  Evaluation showed hyponatremia with serum sodium of 122, creatinine of 1.5, bicarbonate of 19, chloride of 89, hemoglobin 11.4.  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 Per renal service, mild hypercalcemia probably secondary to being bedridden and being on vitamin D. He was also noted to have pneumonia on LLL consolidation on CXR and CT abd, hence treated with Levaquin. Patient however continued to decline progressively, neurology was also consulted on 10/18/2011 for altered mental status. MRI was obtained which showed generalized brain atrophy with mild chronic small vessel changes but no acute intracranial findings. Patient over the past few days as progressively declined. Neurology recommended to keep all sedating medications to minimum and continue to treat metabolic abnormalities and antibiotics. Physical therapy evaluations recommended skilled nursing facility. Patient's oral intake continued to progressively decline and this was discussed with son. Palliative medicine consult was placed on 10/20/2011, Patient was made DNR. Patient's family requested a trial of panda tube feedings before pursuing total Comfort Care approach. After several attempts, NG tube was placed and patient was started on tube feedings on 10/21/2011. Patient's mental status has progressively worsened and currently is almost unresponsive. He is  tolerating the tube feeds so far. Palliative meeting for re [place] is scheduled on Sunday, 10/24/2011.  10/23/11: Patient is now on goal rate of tube feeds at 50 cc/hour, tolerating. Also on free water 200 cc q8hrs, D5W at 100 cc/hr. Decreased IVF to 75cc/hr.     10/24/11: Patient alert and awake, had pulled his NG tube yesterday. Reinserted panda tube last night and Tube Feeds restarted, currently at goal, dec IVF to 50cc/hr.    10/25/11: Patient communicating appropriately, okay for SNF and artificial feeding. IVF [place]  10/26/11: Swallow eval done, speech therapy recommended MBS tomorrow  Subjective: Patient alert, awake and oriented. No fevers or chills. Tolerating tube feeds. Swallow eval today. No acute issues overnight  Objective: Weight change: -1.3 kg (-2 lb 13.9 oz)  Intake/Output Summary (Last 24 hours) at 10/26/11 1339 Last data filed at 10/26/11 0956  Gross per 24 hour  Intake   1759 ml  Output    625 ml  Net   1134 ml   Blood pressure 148/63, pulse 89, temperature 97.3 F (36.3 C), temperature source Oral, resp. rate 14, height 5\' 10"  (1.778 m), weight 61.6 kg (135 lb 12.9 oz), SpO2 100.00%.  Physical Exam: General: Alert and awake, communicating HEENT: anicteric sclera, pupils reactive to light and accommodation, EOMI CVS: S1-S2 clear, no murmur rubs or gallops Chest: Decreased breath sound at the bases Abdomen: soft nontender, nondistended, normal bowel sounds, no organomegaly Extremities: no cyanosis, clubbing, trace edema noted bilaterally  Lab Results: Basic Metabolic Panel:  Lab [id number] 10/25/11 0720 10/22/11 0522  NA 136 137 --  K 3.9 3.8 --  CL 104 104 --  CO2 24 25 --  GLUCOSE 162* 180* --  BUN 29* 31* --  CREATININE 0.95 0.99 --  CALCIUM 9.2 9.2 --  MG -- -- 2.4  PHOS -- -- 3.4   No results found for this basename: AMMONIA:2 in the last 168 hours CBC:  Lab 10/26/11 0545 10/25/11 0720  WBC 8.2 8.1  NEUTROABS -- --  HGB 9.0* 9.7*  HCT 27.5* 30.1*    MCV 93.9 94.1  PLT 213 189    CBG:  Lab 10/26/11 1113 10/26/11 0809 10/26/11 0449 10/26/11 0010 10/25/11 2032  GLUCAP 191* 195* 118* 146* 110*     Micro Results: No results found for this or any previous visit (from the past 240 hour(s)).  Studies/Results: Ct Abdomen Pelvis Wo Contrast  10/03/2011   IMPRESSION: 1.  Small amount of mesenteric edema and small volume of ascites. 2.  Biliary sludge layering dependently within the lumen of the gallbladder.  No signs of acute cholecystitis at this time. 3.  Normal appendix. 4.  Extensive left lower lobe airspace consolidation most compatible with pneumonia. 5.  Atherosclerosis.  6.  Small hiatal hernia. 7.  Soft tissue lesion just beneath the right inguinal canal suspicious for an incompletely distended testicle, as above.  Original Report Authenticated By: Etheleen Mayhew, M.D.   Dg Chest 2 View  10/10/2011  *RADIOLOGY REPORT*  Clinical Data: Follow-up pneumonia.  Cough and congestion.  CHEST - 2 VIEW  Comparison: 10/06/2011  Findings: The cardiomediastinal silhouette is stable with upper limits normal heart size. Airspace disease/consolidation within the left lower lobe is again noted with probable left pleural effusion. Right basilar airspace disease/atelectasis is identified. There is no evidence of pneumothorax. No other changes identified.  IMPRESSION: Relatively unchanged chest radiograph with continued left lower lobe airspace disease/consolidation and probable effusion.  Stable right basilar atelectasis/airspace disease.  Original Report Authenticated By: Lura Em, M.D.   Dg Chest 2 View  10/03/2011  IMPRESSION: Left lower lobe airspace opacity raises concern for pneumonia. However, this does not appear to match the patient's clinical history; suggest clinical correlation and further evaluation as deemed appropriate.  Original Report Authenticated By: Santa Lighter, M.D.   Ct Head Wo Contrast  10/17/2011   IMPRESSION:  1.  No  acute intracranial abnormalities.  Original Report Authenticated By: Angelita Ingles, M.D.   Mr Brain Wo Contrast  10/17/2011 IMPRESSION: Generalized brain atrophy.  Mild chronic small vessel change.  No acute or subacute to intracranial finding.  The ventricles are prominent, but this is in proportion to the degree of generalized atrophy. However, I think the ventricles are slightly larger on the CT scan done today when compared to last December.  This raises the possibility of hydrocephalus.  Fluid throughout both mastoid air cell regions.  These could be sterile mastoid effusions or indicate mastoiditis.  Original Report Authenticated By: Jules Schick, M.D.   US Renal  10/11/2011  IMPRESSION:  1.  No focal renal abnormalities.  Specifically, no evidence of hydronephrosis. 2.  Prostatomegaly.  Original Report Authenticated By: Etheleen Mayhew, M.D.   Dg Chest Port 1 View  10/06/2011  IMPRESSION: Worsened left effusion and airspace disease compatible with progressive pneumonia.  Mild right basilar opacity is also now identified.  Original Report Authenticated By: Arvid Right. Luther Parody, M.D.    Medications: Scheduled Meds:    . capsaicin   Topical BID  . cyanocobalamin  1,000 mcg Intramuscular Q30 days  . darbepoetin (ARANESP) injection - NON-DIALYSIS  100 mcg Subcutaneous Q Mon-1800  . free water  200 mL Per Tube Q6H  . heparin  5,000 Units  Subcutaneous Q8H  . hydrALAZINE  5 mg Intravenous Q8H  . insulin aspart  0-15 Units Subcutaneous Q4H  . insulin glargine  15 Units Subcutaneous QHS  . pantoprazole (PROTONIX) IV  40 mg Intravenous QHS  . prednisoLONE acetate  1 drop Both Eyes Daily  . sodium chloride  3 mL Intravenous Q12H   Continuous Infusions:    . feeding supplement (JEVITY 1.2 CAL) 1,000 mL (10/25/11 1904)     Assessment/Plan:   Hypernatremia: Resolved - Continue free water 200cc qid.  Hypokalemia : Resolved   Bilateral LE edema likely due to decompensated  acute on chronic diastolic CHF and CKD  - 2-D echo 01/13 ejection fraction 0000000, Grade 1 diastolic dysfunction, no valvular abnormalities   Metabolic encephalopathy: Improved, patient currently alert and oriented. Probably worsened from Pneumonia, poor oral intake and hypernatremia, Head CT and MRI both negative for acute stroke. - Per neurology recommendations, minimize sedating medications   Pneumonia/ Left lower lobe consolidation with effusion: started IV levaquin 6/19, repeat CXR with increasing L lung consolidation - on PRN xopenex nebs, completed 10 days of Levaquin on 6/28   ARF on CKD2: Resolved  - Renal USG with normal parenchyma and no hydronephrosis, M spike on SPEP - Per nephrology, serum IFE results pending, could have myeloma but poor candidate for aggressive therapy   DM (diabetes mellitus), type 1, uncontrolled w/ophthalmic complication: Hemoglobin A1c 10.6  - Increased Lantus 18 units, continue moderate sliding scale q 4hrs  Anemia: H&H stable, continue aranesp  HTN: Stable   Dysphagia: On tube feeds, currently at goal rate of 50 cc an hour. Hypernatremia resolved with IVF. Swallow eval done today by speech therapy, and recommended MBS tomorrow. Patient is agreeable for PEG tube placement if he fails MBS tomorrow or have significant aspiration.  DVT Prophylaxis: Subcutaneous heparin  Code Status: DNR  Disposition:  Pending MBS and trial of orals tomorrow. Patient has a bed at [facility] likely on Thurs pending nutritional status, his PEG tube needed or not.    LOS: 54 days   Dicky Boer M.D. [facility] 10/26/2011, 1:39 PM Pager: [phone]  If 7PM-7AM, please contact night-coverage [email or web address] Password [email or web address]

## 2011-10-26 NOTE — Progress Notes (Signed)
Physical Therapy Treatment Patient Details Name: Eric [name] MRN: [id number] DOB: [birth year] Today's Date: 10/26/2011 Time: [id number] PT Time Calculation (min): 25 min  PT Assessment / Plan / Recommendation Comments on Treatment Session  pt presents with Encephalopathy, CHF and CKD.  pt able to participate in mobility, just requires increased time and cueing for initiation.      Follow Up Recommendations  Skilled nursing facility    Barriers to Discharge        Equipment Recommendations  Defer to next venue    Recommendations for Other Services    Frequency Min 2X/week   Plan Discharge plan remains appropriate;Frequency remains appropriate    Precautions / Restrictions Precautions Precautions: Fall Restrictions Weight Bearing Restrictions: No   Pertinent Vitals/Pain No c/o pain.      Mobility  Bed Mobility Bed Mobility: Supine to Sit;Sitting - Scoot to Edge of Bed Supine to Sit: 1: +2 Total assist Supine to Sit: Patient Percentage: 40% Sitting - Scoot to Edge of Bed: 1: +2 Total assist Sitting - Scoot to Edge of Bed: Patient Percentage: 10% Details for Bed Mobility Assistance: pt attempting to A with bed mobility and follows one step directions, however needs increased time and cues to initiate Transfers Transfers: Sit to Stand;Stand to Sit;Stand Pivot Transfers Sit to Stand: 1: +2 Total assist;From bed Sit to Stand: Patient Percentage: 30% Stand to Sit: 1: +2 Total assist;To chair/3-in-1 Stand to Sit: Patient Percentage: 20% Stand Pivot Transfers: 1: +2 Total assist Stand Pivot Transfers: Patient Percentage: 20% Details for Transfer Assistance: pt again attempts to A with mobility, but needs cues to initiate and increased time.   Ambulation/Gait Ambulation/Gait Assistance: Not tested (comment) Stairs: No Wheelchair Mobility Wheelchair Mobility: No    Exercises     PT Diagnosis:    PT Problem List:   PT Treatment Interventions:     PT Goals Acute Rehab PT  Goals Time For Goal Achievement: 11/01/11 PT Goal: Supine/Side to Sit - Progress: Progressing toward goal PT Goal: Sit at Edge Of Bed - Progress: Progressing toward goal PT Goal: Sit to Stand - Progress: Progressing toward goal PT Goal: Stand to Sit - Progress: Progressing toward goal PT Transfer Goal: Bed to Chair/Chair to Bed - Progress: Progressing toward goal  Visit Information  Last PT Received On: 10/26/11 Assistance Needed: +2    Subjective Data  Subjective: Agreeable to OOB and answers simple questions.     Cognition  Overall Cognitive Status: Difficult to assess Area of Impairment: Attention;Memory;Following commands Difficult to assess due to: Level of arousal Arousal/Alertness: Lethargic Orientation Level: Disoriented to;Place;Time Behavior During Session: Lethargic Current Attention Level: Focused Attention - Other Comments: Needs repeated redirection to task.  Lethargy impacting attention as well. Memory: Decreased recall of precautions Following Commands: Follows one step commands with increased time    Balance  Balance Balance Assessed: No  End of Session PT - End of Session Equipment Utilized During Treatment: Gait belt;Oxygen Activity Tolerance: Patient limited by fatigue Patient left: in chair;with call bell/phone within reach Nurse Communication: Mobility status;Need for lift equipment   GP     Catarina Hartshorn, [place] 10/26/2011, 2:40 PM

## 2011-10-27 ENCOUNTER — Inpatient Hospital Stay (HOSPITAL_COMMUNITY): Payer: Medicare Other

## 2011-10-27 DIAGNOSIS — G40909 Epilepsy, unspecified, not intractable, without status epilepticus: Secondary | ICD-10-CM | POA: Diagnosis present

## 2011-10-27 DIAGNOSIS — E1169 Type 2 diabetes mellitus with other specified complication: Secondary | ICD-10-CM

## 2011-10-27 DIAGNOSIS — E11649 Type 2 diabetes mellitus with hypoglycemia without coma: Secondary | ICD-10-CM | POA: Diagnosis not present

## 2011-10-27 DIAGNOSIS — R1310 Dysphagia, unspecified: Secondary | ICD-10-CM

## 2011-10-27 LAB — BASIC METABOLIC PANEL
BUN: 24 mg/dL — ABNORMAL HIGH (ref 6–23)
Chloride: 107 mEq/L (ref 96–112)
Creatinine, Ser: 0.88 mg/dL (ref 0.50–1.35)
GFR calc Af Amer: 88 mL/min — ABNORMAL LOW (ref >90)
GFR calc non Af Amer: 76 mL/min — ABNORMAL LOW (ref >90)
Glucose, Bld: 60 mg/dL — ABNORMAL LOW (ref 70–99)
Potassium: 3.8 mEq/L (ref 3.5–5.1)

## 2011-10-27 LAB — GLUCOSE, CAPILLARY
Glucose-Capillary: 58 mg/dL — ABNORMAL LOW (ref 70–99)
Glucose-Capillary: 188 mg/dL — ABNORMAL HIGH (ref 70–99)

## 2011-10-27 LAB — CBC
HCT: 27.3 % — ABNORMAL LOW (ref 39.0–52.0)
Hemoglobin: 9 g/dL — ABNORMAL LOW (ref 13.0–17.0)
MCHC: 33 g/dL (ref 30.0–36.0)
MCV: 93.5 fL (ref 78.0–100.0)
RDW: 15.2 % (ref 11.5–15.5)

## 2011-10-27 MED ORDER — DEXTROSE 50 % IV SOLN
INTRAVENOUS | Status: AC
  Administered 2011-10-27: 50 mL via INTRAVASCULAR
  Filled 2011-10-27: qty 50

## 2011-10-27 MED ORDER — SODIUM CHLORIDE 0.9 % IV SOLN: INTRAVENOUS | Status: DC

## 2011-10-27 MED ORDER — ENSURE COMPLETE PO LIQD
237.0000 mL | Freq: Two times a day (BID) | ORAL | Status: DC
  Administered 2011-10-28: 237 mL via ORAL

## 2011-10-27 MED ORDER — INSULIN GLARGINE 100 UNIT/ML ~~LOC~~ SOLN: 10.0000 [IU] | Freq: Every day | SUBCUTANEOUS | Status: DC

## 2011-10-27 MED ORDER — DEXTROSE 5 % IV SOLN
INTRAVENOUS | Status: DC
  Administered 2011-10-27 (×2): via INTRAVENOUS

## 2011-10-27 NOTE — Progress Notes (Addendum)
[facility] PROGRESS NOTE  Eric [name] [id number] DOB: 22-Apr-1923 DOA: 10/02/2011 PCP: Renato Shin, MD  Assessment/Plan: Active Problems:  DM (diabetes mellitus), type 1, uncontrolled w/ophthalmic complication  HYPERLIPIDEMIA  ANEMIA-NOS  HYPERTENSION  PSA, INCREASED  CKD (chronic kidney disease) stage 3, GFR 30-59 ml/min  Dependent edema  Hyponatremia  Generalized weakness  Altered mental status  Hypernatremia  1. Hypoglycemia in DM 2: Secondary to poor oral intake, dislodged PANDA and insulins. Diet started per speech therapy recommendations. DC IV D5 water and hold all insulins for 24 hours and monitor CBGs closely. 2. Dysphagia: Dislodged NG tube early this morning. Started dysphagia 3 diet as per speech therapy recommendation: Seems to be tolerating well. Monitor. 3. Metabolic encephalopathy: Seems to have improved. Unclear if close to his baseline-to discuss with family. CT and MRI head negative for acute stroke. Probably secondary to acute illness-pneumonia, hypernatremia 4. Hypernatremia: Resolved  5. Hypokalemia : Resolved  6. Bilateral LE edema likely due to decompensated acute on chronic diastolic CHF and CKD: Compensated. 2-D echo 01/13 ejection fraction 0000000, Grade 1 diastolic dysfunction, no valvular abnormalities  7. Pneumonia/ Left lower lobe consolidation with effusion: Completed 10 days of Levaquin.  8. ARF on CKD2: Resolved. Renal USG with normal parenchyma and no hydronephrosis, M spike on SPEP. Per nephrology, serum IFE results pending, could have myeloma but poor candidate for aggressive therapy. Monoclonal IgG lambda protein is present. Monoclonal free lambda light chains present= unclear significance. Discuss with nephrology/hematology in a.m.  9. Anemia: H&H stable, continue aranesp  10. HTN: Stable  11. DVT Prophylaxis: Subcutaneous heparin  12. Code Status: DNR   Code Status: DO NOT RESUSCITATE  Family Communication:  Disposition Plan: Not  medically ready for discharge  Brief narrative:  Eric [name] is an 78 y.o. male with history of hypertension, diabetes, chronic renal insufficiency, hypertension, elevated PSA, lived alone and was actively working, presented to the urgent care clinic complaining of bilateral pedal edema along with been feeling weak on 10/02/2011. Evaluation showed hyponatremia with serum sodium of 122, creatinine of 1.5, bicarbonate of 19, chloride of 89, hemoglobin 11.4. Hyponatremia was felt to be due to SIADH and hypervolemia. He was aggressively diuresed with large doses IV lasix for several days, then demeclocycline was added to his regimen. Per renal service, mild hypercalcemia probably secondary to being bedridden and being on vitamin D.   He was also noted to have pneumonia on LLL consolidation on CXR and CT abd, hence treated with Levaquin. Patient however continued to decline progressively, neurology was also consulted on 10/18/2011 for altered mental status. MRI was obtained which showed generalized brain atrophy with mild chronic small vessel changes but no acute intracranial findings. Neurology recommended to keep all sedating medications to minimum and continue to treat metabolic abnormalities and antibiotics.   Physical therapy evaluations recommended skilled nursing facility. Patient's oral intake continued to progressively decline and this was discussed with son. Palliative medicine consult was placed on 10/20/2011, Patient was made DNR. Patient's family requested a trial of panda tube feedings before pursuing total Comfort Care approach. After several attempts, NG tube was placed and patient was started on tube feedings on 10/21/2011. Patient dislodged PANDA tube on the night of 7/9. Speech therapy evaluated on 7/10 and cleared for dysphagia 3 diet. Episodes of hypoglycemia on 7/10 AM.  Consultants:  Palliative care medicine.  Nephrology.  Neurology   Procedures:  PANDA  placement  Antibiotics:  None now  HPI/Subjective: Patient denies complaints. He indicates that  he's thirsty and would like something to eat. Denies dyspnea or chest pain.  Objective: Filed Vitals:   10/26/11 2016 10/27/11 0556 10/27/11 0612 10/27/11 1400  BP: 161/74 146/66  153/67  Pulse: 97  88 96  Temp: 98.1 F (36.7 C)  97.9 F (36.6 C) 98.4 F (36.9 C)  TempSrc: Oral  Oral Oral  Resp: 17  16 18   Height:      Weight:   61.3 kg (135 lb 2.3 oz)   SpO2: 95%  97% 97%    Intake/Output Summary (Last 24 hours) at 10/27/11 1705 Last data filed at 10/27/11 1414  Gross per 24 hour  Intake    774 ml  Output    750 ml  Net     24 ml    Exam:  General exam: Chronically ill-looking male, in no obvious distress. Respiratory system: Slightly reduced breath sounds in the bases but otherwise clear to auscultation. No increased work of breathing. Cardiovascular system: First and second heart sounds heard, regular. No JVD or murmurs or pedal edema. Telemetry shows sinus rhythm with occasional PVCs. Transient 5 beats sinus bradycardia in the 50s. Gastrointestinal system: Abdomen is nondistended, soft and normal bowel sounds heard. Nontender. Central nervous system: Alert and oriented to person and place and partly to time. No focal neurological deficits. Extremities: Symmetric 5 x 5 power  Data Reviewed: Basic Metabolic Panel:  Lab [id number] 0500 10/26/11 0545 10/25/11 0720 10/24/11 0535 10/23/11 0500 10/22/11 0522  NA 140 136 137 142 148* --  K 3.8 3.9 3.8 3.4* 3.8 --  CL 107 104 104 107 114* --  CO2 25 24 25 28 25  --  GLUCOSE 60* 162* 180* 170* 156* --  BUN 24* 29* 31* 36* 49* --  CREATININE 0.88 0.95 0.99 1.14 1.27 --  CALCIUM 9.1 9.2 9.2 9.5 10.2 --  MG -- -- -- -- -- 2.4  PHOS -- -- -- -- -- 3.4   Liver Function Tests: No results found for this basename: AST:5,ALT:5,ALKPHOS:5,BILITOT:5,PROT:5,ALBUMIN:5 in the last 168 hours No results found for this basename:  LIPASE:5,AMYLASE:5 in the last 168 hours No results found for this basename: AMMONIA:5 in the last 168 hours CBC:  Lab 10/27/11 0500 10/26/11 0545 10/25/11 0720 10/24/11 0535 10/23/11 0500  WBC 7.5 8.2 8.1 7.7 8.5  NEUTROABS -- -- -- -- --  HGB 9.0* 9.0* 9.7* 9.0* 9.1*  HCT 27.3* 27.5* 30.1* 27.7* 28.6*  MCV 93.5 93.9 94.1 95.8 97.9  PLT 225 213 189 213 196   Cardiac Enzymes: No results found for this basename: CKTOTAL:5,CKMB:5,CKMBINDEX:5,TROPONINI:5 in the last 168 hours BNP (last 3 results)  Basename 10/02/11 1938 10/01/11 1036 03/30/11 1055  PROBNP 396.1 1962.0* 27.0   CBG:  Lab 10/27/11 1601 10/27/11 1218 10/27/11 0850 10/27/11 0758 10/27/11 0428  GLUCAP 214* 122* 188* 58* 62*    No results found for this or any previous visit (from the past 240 hour(s)).   Studies:  Studies/Results:   Ct Abdomen Pelvis Wo Contrast   10/03/2011 IMPRESSION: 1. Small amount of mesenteric edema and small volume of ascites. 2. Biliary sludge layering dependently within the lumen of the gallbladder. No signs of acute cholecystitis at this time. 3. Normal appendix. 4. Extensive left lower lobe airspace consolidation most compatible with pneumonia. 5. Atherosclerosis. 6. Small hiatal hernia. 7. Soft tissue lesion just beneath the right inguinal canal suspicious for an incompletely distended testicle, as above. Original Report Authenticated By: Etheleen Mayhew, M.D.   Dg Chest 2 View  10/10/2011 *RADIOLOGY REPORT* Clinical Data: Follow-up pneumonia. Cough and congestion. CHEST - 2 VIEW Comparison: 10/06/2011 Findings: The cardiomediastinal silhouette is stable with upper limits normal heart size. Airspace disease/consolidation within the left lower lobe is again noted with probable left pleural effusion. Right basilar airspace disease/atelectasis is identified. There is no evidence of pneumothorax. No other changes identified. IMPRESSION: Relatively unchanged chest radiograph with continued left  lower lobe airspace disease/consolidation and probable effusion. Stable right basilar atelectasis/airspace disease. Original Report Authenticated By: Lura Em, M.D.   Dg Chest 2 View   10/03/2011 IMPRESSION: Left lower lobe airspace opacity raises concern for pneumonia. However, this does not appear to match the patient's clinical history; suggest clinical correlation and further evaluation as deemed appropriate. Original Report Authenticated By: Santa Lighter, M.D.   Ct Head Wo Contrast   10/17/2011 IMPRESSION: 1. No acute intracranial abnormalities. Original Report Authenticated By: Angelita Ingles, M.D.   Mr Brain Wo Contrast   10/17/2011 IMPRESSION: Generalized brain atrophy. Mild chronic small vessel change. No acute or subacute to intracranial finding. The ventricles are prominent, but this is in proportion to the degree of generalized atrophy. However, I think the ventricles are slightly larger on the CT scan done today when compared to last December. This raises the possibility of hydrocephalus. Fluid throughout both mastoid air cell regions. These could be sterile mastoid effusions or indicate mastoiditis. Original Report Authenticated By: Jules Schick, M.D.   US Renal   10/11/2011 IMPRESSION: 1. No focal renal abnormalities. Specifically, no evidence of hydronephrosis. 2. Prostatomegaly. Original Report Authenticated By: Etheleen Mayhew, M.D.   Dg Chest Port 1 View   10/06/2011 IMPRESSION: Worsened left effusion and airspace disease compatible with progressive pneumonia. Mild right basilar opacity is also now identified. Original Report Authenticated By: Arvid Right. D'ALESSIO, M.D.   Dg Swallowing Func-no Report  10/27/2011  CLINICAL DATA: aspiration/ dysphagia   FLUOROSCOPY FOR SWALLOWING FUNCTION STUDY:  Fluoroscopy was provided for swallowing function study, which was  administered by a speech pathologist.  Final results and recommendations  from this study are contained  within the speech pathology report.     Scheduled Meds:    . capsaicin   Topical BID  . cyanocobalamin  1,000 mcg Intramuscular Q30 days  . darbepoetin (ARANESP) injection - NON-DIALYSIS  100 mcg Subcutaneous Q Mon-1800  . dextrose      . feeding supplement  237 mL Oral BID BM  . heparin  5,000 Units Subcutaneous Q8H  . hydrALAZINE  5 mg Intravenous Q8H  . insulin aspart  0-15 Units Subcutaneous Q4H  . insulin glargine  18 Units Subcutaneous QHS  . pantoprazole (PROTONIX) IV  40 mg Intravenous QHS  . prednisoLONE acetate  1 drop Both Eyes Daily  . sodium chloride  3 mL Intravenous Q12H  . DISCONTD: free water  200 mL Per Tube Q6H   Continuous Infusions:    . dextrose Stopped (10/27/11 0546)  . DISCONTD: sodium chloride    . DISCONTD: feeding supplement (JEVITY 1.2 CAL) 1,000 mL (10/25/11 1904)     [place] Pager [phone]  If 7PM-7AM, please contact night-coverage [email or web address] Password [email or web address] 10/27/2011, 5:05 PM   LOS: 25 days

## 2011-10-27 NOTE — Progress Notes (Signed)
Nutrition Follow-up  Intervention:   1. Ensure Complete po BID, each supplement provides 350 kcal and 13 grams of protein. 2. D/c tube feeding orders 3. RD will continue to follow    Assessment:   Pt being followed by palliative care.  Pt was receiving nutrition via panda tube, open to nutrition via PEG for long term. Nutrition was not able to be started until 7/5 due to placement issues. Pt pulled out panda on 7/6, tube reinserted and restarted. Pulled panda out again on 7/10.  Followed by SLP for diet advancement, MBS was completed on 7/10. Recommend pt advance to dysphagia 3 with thin liquids with MS improving.   Pt reports eating well for breakfast. No problems with meal. Is willing to drink nutrition supplements for increased kcal and protein intake.   Diet Order:  Dysphagia 3, thin liquids [id number] 1.2 @ 50 ml/hr, provides 1440 kcal, 67 gm protein, 1368 ml free water. Water: 200 ml q 6 hours  Meds: Scheduled Meds:   . capsaicin   Topical BID  . cyanocobalamin  1,000 mcg Intramuscular Q30 days  . darbepoetin (ARANESP) injection - NON-DIALYSIS  100 mcg Subcutaneous Q Mon-1800  . dextrose      . free water  200 mL Per Tube Q6H  . heparin  5,000 Units Subcutaneous Q8H  . hydrALAZINE  5 mg Intravenous Q8H  . insulin aspart  0-15 Units Subcutaneous Q4H  . insulin glargine  18 Units Subcutaneous QHS  . pantoprazole (PROTONIX) IV  40 mg Intravenous QHS  . prednisoLONE acetate  1 drop Both Eyes Daily  . sodium chloride  3 mL Intravenous Q12H   Continuous Infusions:   . dextrose Stopped (10/27/11 0546)  . feeding supplement (JEVITY 1.2 CAL) 1,000 mL (10/25/11 1904)  . DISCONTD: sodium chloride     PRN Meds:.levalbuterol, morphine injection  Labs:  CMP     Component Value Date/Time   NA 140 10/27/2011 0500   K 3.8 10/27/2011 0500   CL 107 10/27/2011 0500   CO2 25 10/27/2011 0500   GLUCOSE 60* 10/27/2011 0500   BUN 24* 10/27/2011 0500   CREATININE 0.88 10/27/2011 0500   CALCIUM  9.1 10/27/2011 0500   CALCIUM 8.8 04/29/2011 1005   PROT 6.7 10/17/2011 0610   ALBUMIN 1.3* 10/17/2011 0610   AST 114* 10/17/2011 0610   ALT 69* 10/17/2011 0610   ALKPHOS 172* 10/17/2011 0610   BILITOT 0.6 10/17/2011 0610   GFRNONAA 76* 10/27/2011 0500   GFRAA 88* 10/27/2011 0500     Intake/Output Summary (Last 24 hours) at 10/27/11 1430 Last data filed at 10/27/11 1414  Gross per 24 hour  Intake    774 ml  Output    750 ml  Net     24 ml    Weight Status:  135 lbs, stable x 4 days, trending up but still below admission weight   Re-estimated needs:  1400-1600 kcal, 60-70 gm protein  Nutrition Dx:  Inadequate oral intake, improving Severe malnutrition of acute illness, ongoing  Goal:  PO intake of meals and supplements to meet >/=90% of estimated nutrition needs to support weight maintenance.    Monitor:  PO intake, weights, labs, I/O's   Orson Slick RD, LDN Pager [phone] After Hours pager [phone]

## 2011-10-27 NOTE — Progress Notes (Signed)
Occupational Therapy Treatment Patient Details Name: Eric [name] MRN: [id number] DOB: 04-Sep-1923 Today's Date: 10/27/2011 Time: [id number] OT Time Calculation (min): 20 min  OT Assessment / Plan / Recommendation Comments on Treatment Session Pt appears to be progressing with therapy. Limited today by fatigue and bil knee pain    Follow Up Recommendations  Skilled nursing facility    Barriers to Discharge       Equipment Recommendations  Defer to next venue    Recommendations for Other Services    Frequency     Plan Discharge plan remains appropriate    Precautions / Restrictions Precautions Precautions: Fall Restrictions Weight Bearing Restrictions: No   Pertinent Vitals/Pain Pt reports bil knee pain with knee flexion; RN made aware. Pt did not rate.     ADL  Grooming: Performed;Wash/dry face;Set up;Minimal assistance Where Assessed - Grooming: Supported sitting (pt with posterior lean) Transfers/Ambulation Related to ADLs: sit to stand x 2 at EOB with +2 assist; no ambulation attempted ADL Comments: Per RN, pt with increased participation compared to previous days    OT Diagnosis:    OT Problem List:   OT Treatment Interventions:     OT Goals Acute Rehab OT Goals Time For Goal Achievement: 11/11/11 Potential to Achieve Goals: Fair ADL Goals Pt Will Perform Grooming: with set-up;with supervision;Standing at sink ADL Goal: Grooming - Progress: Goal set today Pt Will Perform Upper Body Dressing: with set-up;with supervision;Standing ADL Goal: Upper Body Dressing - Progress: Goal set today Pt Will Perform Lower Body Dressing: with set-up;with supervision;Sit to stand from chair;with adaptive equipment ADL Goal: Lower Body Dressing - Progress: Goal set today Pt Will Transfer to Toilet: with supervision;with DME;3-in-1;Ambulation ADL Goal: Toilet Transfer - Progress: Goal set today Additional ADL Goal #1: Pt. will recall 3 energy conservation techniques with ADLs ADL  Goal: Additional Goal #1 - Progress: Goal set today  Visit Information  Last OT Received On: 10/27/11 Assistance Needed: +2    Subjective Data      Prior Functioning       Cognition  Overall Cognitive Status: Difficult to assess Difficult to assess due to: Hard of hearing/deaf Arousal/Alertness: Awake/alert Behavior During Session: [name] for tasks performed Following Commands: Follows one step commands with increased time    Mobility Bed Mobility Rolling Right: 4: Min assist;With rail Rolling Left: 4: Min assist;With rail Left Sidelying to Sit: 3: Mod assist;HOB flat;With rails Sit to Supine: 3: Mod assist;HOB flat;With rail Details for Bed Mobility Assistance: VC and tactile cues for sequencing. Assist to come to upright sitting and to bring bil LE into bed Transfers Sit to Stand: 1: +2 Total assist;From bed;With upper extremity assist Sit to Stand: Patient Percentage: 60% Stand to Sit: 1: +2 Total assist;To bed;With upper extremity assist Stand to Sit: Patient Percentage: 50% Details for Transfer Assistance: VC for hand placement; tactile cues and manual factilitation provided at hips and anterior chest to promote hip and thoracic extension for upright standing.   Exercises    Balance Static Sitting Balance Static Sitting - Balance Support: Feet supported;Left upper extremity supported Static Sitting - Level of Assistance: 4: Min assist;3: Mod assist Static Sitting - Comment/# of Minutes: pt with posterior lean- able to self correct at times, but requiring min to mod A when pt tiring. pt sat EOB ~59min  End of Session OT - End of Session Equipment Utilized During Treatment: Gait belt Activity Tolerance: Patient limited by pain Patient left: in bed;with call bell/phone within reach;with nursing in room  Nurse Communication: Mobility status  GO     Eric [name] 10/27/2011, 3:41 PM

## 2011-10-27 NOTE — Progress Notes (Signed)
cbg 60 1 amp D50 given. Pt is currently on D5W at 75hr and remains npo

## 2011-10-27 NOTE — Progress Notes (Signed)
0042 Entered patient room and seen that his panda tube was lying on his bed with tape still intact. MD on call and charge RN were notified. Scheduled CBG was taken and was 52, MD on call was also notified of low CBG. Interventional radiology was called and notified. Was told that staff would be coming in around 7 am today and patient will be seen today for placement. Patient was started on dextrose 5% . CBG was checked again and was 60, MD on call was notified .Patient IVF was increased. Patient also had a HR of 39 non-sustained while lying asleep in bed, asymptomatic. MD on call was also notified.

## 2011-10-27 NOTE — Procedures (Signed)
Objective Swallowing Evaluation: Modified Barium Swallowing Study  Patient Details  Name: Eric [name] MRN: [id number] Date of Birth: [birth year]  Today's Date: 10/27/2011 Time: 0910-0930 SLP Time Calculation (min): 20 min  Past Medical History:  Past Medical History  Diagnosis Date  . ANEMIA-NOS 02/17/2007  . DEPRESSION 12/25/2008  . HYPERTENSION 02/17/2007  . GERD 02/17/2007  . OSTEOARTHRITIS 03/04/2010  . DIABETES MELLITUS, TYPE I, CONTROLLED, WITH RETINOPATHY   . Gastroparesis   . BENIGN PROSTATIC HYPERTROPHY   . VITAMIN B12 DEFICIENCY   . BACK PAIN, LUMBAR   . LIVER DISORDER   . PSA, INCREASED   . Hyperlipidemia   . Diabetes mellitus   . Pneumonia   . Anxiety   . Encephalopathy, metabolic    Past Surgical History:  Past Surgical History  Procedure Date  . Transurethral resection of prostate    HPI:  78 y.o. male with history of hypertension, diabetes, chronic renal insufficiency, hypertension, elevated PSA, family history of prostate cancers in both brothers, lives alone and is still actively working, complaining of bilateral pedal edema along with been feeling weak.aluation showed hyponatremia with serum sodium of 122, creatinine of 1.5, bicarbonate of 19 and chloride of 89. Patient was admitted to hospital and was followed by nephrology, received iv lasix . His sodium has improved, also has diuresed about 13 liters of fluid. Patient now has altered mental status, most likely due to metabolic encephalopathy. Has poor po intake, discussed with family regarding trial of tube feedings, long term artificial nutrition with peg tube, confirmed the code status, comfort care approach if no improvement.     Assessment / Plan / Recommendation Clinical Impression  Dysphagia Diagnosis: Mild oral phase dysphagia;Mild pharyngeal phase dysphagia Clinical impression: Patient presents with a mild oropharyngeal dysphagia characterized by base of  tongue weakness resulting in premature  spillage of bolus, delayed swallow initiation, and mild-moderate vallecular residuals post swallow (solids > liquids) however with full airway protection. No penetration or aspiration observed. Vallecular residuals decreased as study progressed and cleared with dry swallow. Continued lethargy does increase aspiration risk however at this time, recommend initiation of dysphagia 3 (mechanical soft) with thin liquids with aspiration precautions. SLP will f/u at bedside for diet tolerance.     Treatment Recommendation  Therapy as outlined in treatment plan below    Diet Recommendation Dysphagia 3 (Mechanical Soft);Thin liquid   Liquid Administration via: Cup;Straw Medication Administration: Whole meds with puree Supervision: Patient able to self feed;Full supervision/cueing for compensatory strategies Compensations: Slow rate;Small sips/bites;Multiple dry swallows after each bite/sip Postural Changes and/or Swallow Maneuvers: Seated upright 90 degrees    Other  Recommendations Oral Care Recommendations: Oral care BID   Follow Up Recommendations  Skilled Nursing facility    Frequency and Duration min 2x/week  2 weeks   Pertinent Vitals/Pain n/a    SLP Swallow Goals Patient will consume recommended diet without observed clinical signs of aspiration with: Supervision/safety Swallow Study Goal #1 - Progress: Not Met Patient will utilize recommended strategies during swallow to increase swallowing safety with: Supervision/safety Swallow Study Goal #2 - Progress: Not met   General HPI: 78 y.o. male with history of hypertension, diabetes, chronic renal insufficiency, hypertension, elevated PSA, family history of prostate cancers in both brothers, lives alone and is still actively working, complaining of bilateral pedal edema along with been feeling weak.aluation showed hyponatremia with serum sodium of 122, creatinine of 1.5, bicarbonate of 19 and chloride of 89. Patient was admitted to hospital  and was followed  by nephrology, received iv lasix . His sodium has improved, also has diuresed about 13 liters of fluid. Patient now has altered mental status, most likely due to metabolic encephalopathy. Has poor po intake, discussed with family regarding trial of tube feedings, long term artificial nutrition with peg tube, confirmed the code status, comfort care approach if no improvement. Type of Study: Modified Barium Swallowing Study Reason for Referral: Objectively evaluate swallowing function Previous Swallow Assessment: Bedside re-evaluation complete on 7/9 and recommended MBS Diet Prior to this Study: NPO Temperature Spikes Noted: No Respiratory Status: Room air History of Recent Intubation: No Behavior/Cognition: Lethargic;Cooperative Oral Cavity - Dentition: Adequate natural dentition Oral Motor / Sensory Function: Impaired - see Bedside swallow eval Self-Feeding Abilities: Able to feed self Patient Positioning: Upright in chair Baseline Vocal Quality: Low vocal intensity;Clear Volitional Cough: Strong Volitional Swallow: Able to elicit Anatomy: Within functional limits Pharyngeal Secretions: Not observed secondary MBS    Reason for Referral Objectively evaluate swallowing function   Gabriel Rainwater MA, CCC-SLP [phone]           Alonah Lineback Meryl 10/27/2011, 9:41 AM

## 2011-10-27 NOTE — Progress Notes (Signed)
Inpatient Diabetes Program Recommendations  AACE/ADA: New Consensus Statement on Inpatient Glycemic Control (2009)  Target Ranges:  Prepandial:   less than 140 mg/dL      Peak postprandial:   less than 180 mg/dL (1-2 hours)      Critically ill patients:  140 - 180 mg/dL   Reason for Visit: Hypoglcyemia  Inpatient Diabetes Program Recommendations Insulin - Basal: Now off tube feedings with hypoglycemia this am. Please decrease Lantus to 10 units at HS  Note: Thank you, Rosita Kea, RN, CNS, Diabetes Coordinator [phone])

## 2011-10-27 NOTE — Progress Notes (Signed)
Pt tolerating meals well, apetite good- no  Signs aspiration- able to feed self with close monitoring

## 2011-10-28 DIAGNOSIS — D472 Monoclonal gammopathy: Secondary | ICD-10-CM | POA: Diagnosis not present

## 2011-10-28 DIAGNOSIS — R600 Localized edema: Secondary | ICD-10-CM | POA: Diagnosis present

## 2011-10-28 DIAGNOSIS — R627 Adult failure to thrive: Secondary | ICD-10-CM | POA: Diagnosis present

## 2011-10-28 DIAGNOSIS — Y92009 Unspecified place in unspecified non-institutional (private) residence as the place of occurrence of the external cause: Secondary | ICD-10-CM

## 2011-10-28 LAB — GLUCOSE, CAPILLARY
Glucose-Capillary: 134 mg/dL — ABNORMAL HIGH (ref 70–99)
Glucose-Capillary: 184 mg/dL — ABNORMAL HIGH (ref 70–99)

## 2011-10-28 LAB — CBC
HCT: 29 % — ABNORMAL LOW (ref 39.0–52.0)
MCH: 30.9 pg (ref 26.0–34.0)
MCV: 92.4 fL (ref 78.0–100.0)
RBC: 3.14 MIL/uL — ABNORMAL LOW (ref 4.22–5.81)
WBC: 7.8 10*3/uL (ref 4.0–10.5)

## 2011-10-28 LAB — BASIC METABOLIC PANEL
BUN: 21 mg/dL (ref 6–23)
Chloride: 99 mEq/L (ref 96–112)
Glucose, Bld: 134 mg/dL — ABNORMAL HIGH (ref 70–99)
Potassium: 4.2 mEq/L (ref 3.5–5.1)

## 2011-10-28 MED ORDER — ENSURE COMPLETE PO LIQD: 237.0000 mL | Freq: Two times a day (BID) | ORAL | Status: DC

## 2011-10-28 MED ORDER — INSULIN ASPART 100 UNIT/ML ~~LOC~~ SOLN: 0.0000 [IU] | Freq: Three times a day (TID) | SUBCUTANEOUS | Status: DC

## 2011-10-28 MED ORDER — INSULIN ASPART 100 UNIT/ML ~~LOC~~ SOLN: SUBCUTANEOUS | Status: DC

## 2011-10-28 MED ORDER — INSULIN GLARGINE 100 UNIT/ML ~~LOC~~ SOLN: 10.0000 [IU] | Freq: Every day | SUBCUTANEOUS | Status: DC

## 2011-10-28 MED ORDER — LEVALBUTEROL HCL 0.63 MG/3ML IN NEBU: 0.6300 mg | INHALATION_SOLUTION | RESPIRATORY_TRACT | Status: DC | PRN

## 2011-10-28 MED ORDER — CAPSAICIN 0.025 % EX CREA: TOPICAL_CREAM | Freq: Two times a day (BID) | CUTANEOUS | Status: DC

## 2011-10-28 MED ORDER — DARBEPOETIN ALFA-POLYSORBATE 100 MCG/0.5ML IJ SOLN: 100.0000 ug | INTRAMUSCULAR | Status: DC

## 2011-10-28 MED ORDER — INSULIN ASPART 100 UNIT/ML ~~LOC~~ SOLN: 0.0000 [IU] | Freq: Every day | SUBCUTANEOUS | Status: DC

## 2011-10-28 NOTE — Discharge Summary (Addendum)
Discharge Summary  Eric [name] MR#: [id number]  DOB:1923/11/03  Date of Admission: 10/02/2011 Date of Discharge: 10/28/2011  Patient's PCP: Renato Shin, MD  Attending Physician:Simora Dingee  Followup recommendations: 1. CBC and renal panel in one week from hospital discharge. 2. Outpatient oncology consultation-for further evaluation of MGUS. 3. Followup of chest x-ray in a couple of weeks to ensure resolution of left lower lobe pneumonia.   Consults: 1. Palliative care medicine: Eleonore Chiquito, MD 2. Neurology: Dr. Wallie Char. 3. Nephrology: Dr. Corliss Parish   Discharge Diagnoses: Principal Problem:  *Bilateral leg edema Active Problems:  DM (diabetes mellitus), type 1, uncontrolled w/ophthalmic complication  HYPERLIPIDEMIA  ANEMIA-NOS  HYPERTENSION  PSA, INCREASED  CKD (chronic kidney disease) stage 3, GFR 30-59 ml/min  Dependent edema  Hyponatremia  Generalized weakness  Altered mental status  Hypernatremia  Type 2 diabetes mellitus with hypoglycemia  Dysphagia  Encephalopathy  MGUS (monoclonal gammopathy of unknown significance)  Fall at home  Failure to thrive in adult   Brief Admitting History and Physical 78 year old African American male patient with history of hypertension, type 2 DM, stage III chronic kidney disease, elevated PSA, family history of prostate cancer in brothers, presented to an urgent care clinic with bilateral leg edema and weakness. Evaluation revealed sodium 122, creatinine 1.5, bicarbonate 19 and chloride 89. He was also felt that patient was unable to take care of himself adequately and was not safe to be discharged home. The hospitalist service was consulted to admit for further evaluation and management.  Discharge Medications Current Discharge Medication List    START taking these medications   Details  capsaicin (ZOSTRIX) 0.025 % cream Apply topically 2 (two) times daily. Apply to both knees AVOID contact with eyes  and broken or irritated skin.    darbepoetin (ARANESP) 100 MCG/0.5ML SOLN Inject 0.5 mLs (100 mcg total) into the skin every Monday at 6 PM.    feeding supplement (ENSURE COMPLETE) LIQD Take 237 mLs by mouth 2 (two) times daily between meals.    !! insulin aspart (NOVOLOG) 100 UNIT/ML injection 0-5 Units, Subcutaneous, Daily at bedtime,  Correction coverage: HS scale CBG < 70: implement hypoglycemia protocol CBG 70 - 120: 0 units CBG 121 - 150: 0 units CBG 151 - 200: 0 units CBG 201 - 250: 2 units CBG 251 - 300: 3 units CBG 301 - 350: 4 units CBG 351 - 400: 5 units CBG > 400: call MD    !! insulin aspart (NOVOLOG) 100 UNIT/ML injection 0-9 Units, Subcutaneous, 3 times daily with meals, CBG < 70: implement hypoglycemia protocol CBG 70 - 120: 0 units CBG 121 - 150: 1 unit CBG 151 - 200: 2 units CBG 201 - 250: 3 units CBG 251 - 300: 5 units CBG 301 - 350: 7 units CBG 351 - 400: 9 units CBG > 400: call MD    insulin glargine (LANTUS) 100 UNIT/ML injection Inject 10 Units into the skin at bedtime.    levalbuterol (XOPENEX) 0.63 MG/3ML nebulizer solution Take 3 mLs (0.63 mg total) by nebulization every 4 (four) hours as needed for wheezing or shortness of breath.     !! - Potential duplicate medications found. Please discuss with provider.    CONTINUE these medications which have NOT CHANGED   Details  acetaminophen (TYLENOL) 500 MG tablet Take 1,000 mg by mouth every 6 (six) hours as needed. For pain.     aspirin 81 MG tablet Take 81 mg by mouth daily.  atorvastatin (LIPITOR) 20 MG tablet Take 1 tablet (20 mg total) by mouth daily. Qty: 90 tablet, Refills: 1    calcitRIOL (ROCALTROL) 0.25 MCG capsule Take 1 capsule (0.25 mcg total) by mouth daily. Qty: 30 capsule, Refills: 11    cyanocobalamin (,VITAMIN B-12,) 1000 MCG/ML injection Inject 1,000 mcg into the muscle every 30 (thirty) days.    hydrALAZINE (APRESOLINE) 25 MG tablet Take 1 tablet (25 mg total) by mouth 3  (three) times daily. Qty: 90 tablet, Refills: 3    isosorbide mononitrate (ISMO,MONOKET) 10 MG tablet Take 1 tablet (10 mg total) by mouth 2 (two) times daily. Qty: 180 tablet, Refills: 1    omeprazole (PRILOSEC) 40 MG capsule Take 1 capsule (40 mg total) by mouth daily. Qty: 90 capsule, Refills: 1    prednisoLONE acetate (PRED FORTE) 1 % ophthalmic suspension Place 1 drop into both eyes daily.    nitroGLYCERIN (NITROSTAT) 0.4 MG SL tablet Place 1 tablet (0.4 mg total) under the tongue every 5 (five) minutes as needed for chest pain. Qty: 10 tablet, Refills: 0      STOP taking these medications     BAYER CONTOUR TEST test strip Comments:  Reason for Stopping:       BAYER CONTOUR TEST test strip Comments:  Reason for Stopping:       clotrimazole (LOTRIMIN) 1 % cream Comments:  Reason for Stopping:       furosemide (LASIX) 80 MG tablet Comments:  Reason for Stopping:       insulin NPH-insulin regular (NOVOLIN 70/30) (70-30) 100 UNIT/ML injection Comments:  Reason for Stopping:       Insulin Syringe-Needle U-100 31G X 5/16" 0.3 ML MISC Comments:  Reason for Stopping:       lactulose (CHRONULAC) 10 GM/15ML solution Comments:  Reason for Stopping:       losartan (COZAAR) 100 MG tablet Comments:  Reason for Stopping:       ofloxacin (OCUFLOX) 0.3 % ophthalmic solution Comments:  Reason for Stopping:          Hospital Course: Bilateral leg edema Present on Admission:  .Dependent edema .CKD (chronic kidney disease) stage 3, GFR 30-59 ml/min .Hyponatremia .ANEMIA-NOS .PSA, INCREASED .HYPERTENSION .DM (diabetes mellitus), type 1, uncontrolled w/ophthalmic complication .HYPERLIPIDEMIA .Generalized weakness .Dysphagia .Encephalopathy .Bilateral leg edema .Failure to thrive in adult   1. Bilateral lower extremity edema: Probably multifactorial secondary to hypoalbuminemia, chronic kidney disease, venous insufficiency. Patient had been on diuretics prior to  admission. No prior history of heart failure. Clinically improved. Diuretics are currently on hold secondary to recent episode of acute renal failure. 2. Type 2 diabetes mellitus, with hypoglycemic episodes: Due to his encephalopathy and dysphagia, patient was n.p.o. and was being fed via PANDA tube until 10/27/11. The feeding tube fell out on 10/27/11 and patient had couple of episodes of hypoglycemia while still on his insulins. He was briefly placed on IV D5 water and his insulins were temporarily held. Hypoglycemia has resolved. We'll resume Lantus at a reduced dose and sensitive sliding scale insulin. Monitor closely. 3. Generalized weakness/failure to thrive: Probably multifactorial secondary to acute medical illness complicating underlying multiple comorbidities in a patient with advanced age. Patient will be going to skilled nursing facility for rehabilitation. 4. Toxic metabolic encephalopathy: Patient's mental status change was noted on 6/19. Secondary to metabolic derangements-hyponatremia, acute renal failure, pneumonia. Mental status seems to have gradually improved.? Underlying dementia. 5. Left lower lobe pneumonia: Completed treatment with 10 days of Levaquin. Recommend followup of chest  x-ray in a couple of weeks to ensure resolution. Neurology consulted too and stroke workup was negative. 6. Hyponatremia: Stable. Patient's sodiums remained in the 120-126 range during the initial weeks course in the hospital. Nephrology was consulted on 10/05/11. They determined that the hyponatremia was subacute. It was felt that there was an element of hypervolemic hyponatremia and SIADH. Patient apparently had been on Celexa which could contribute to this and was discontinued. He was treated with fluid restriction and IV Lasix and oral demeclocycline. TSH and random cortisol were okay. It seemed to worsen intermittently but then eventually improved and stabilized. He even became hyponatremic probably from  volume contraction and was treated briefly with hypotonic IV fluids .Recommend repeating her renal panel in a week from hospital discharge. Baseline sodium probably in the 130s. 7. Hypokalemia: Repleted. 8. Anemia: Likely multifactorial secondary to acute illness, frequent blood draws, chronic kidney disease, anemia of chronic disease and B12 deficiencies. Continue Aranesp and B12 shots. Periodically follow CBCs. 9. Acute on stage III chronic kidney disease: Nephrology consulted. Acute renal failure was due to aggressive diuresis for his hyponatremia and has resolved. Diuretics and ARB started temporarily on hold. 10. Hyperlipidemia: Resume statins. 11. Hypertension: Resume oral hydralazine. Continue to hold ARB secondary to recent acute renal failure. 12. Dysphagia: Continue dysphagia 3 diet and thin liquids as per speech therapy recommendations. Patient tolerating this diet well. 13. Mild hypercalcemia: Resolved. 14. MGUS: As part of evaluation of renal failure, SPEP and UPEP were done. Given his advanced age he may not be a candidate for aggressive therapy. However recommend outpatient consultation with oncology (patient's case was discussed and reviewed with oncologist on call on 7/11- Dr. Marcy Panning. 15. Advanced directives: Patient's mental status had continued to decline and patient had become extremely lethargic and unresponsive with no significant by mouth intake. At that time palliative care was consulted for goals of care. Family decided on DO NOT RESUSCITATE and they wished to try tube feeding before pursuing total Comfort Care approach. Patient did improve. NG tube has been removed and patient is tolerating diet.  Day of Discharge  Complaints: Patient indicates that he feels okay. He denies pain. Complains of weakness in his legs and he is eager to proceed with therapy at skilled nursing facility.   Physical exam:  BP 160/68  Pulse 94  Temp 97.7 F (36.5 C) (Oral)  Resp 18  Ht 5'  10" (1.778 m)  Wt 61.7 kg (136 lb 0.4 oz)  BMI 19.52 kg/m2  SpO2 97% General exam: Chronically ill-looking male, in no obvious distress.  Respiratory system: Slightly reduced breath sounds in the bases but otherwise clear to auscultation. No increased work of breathing.  Cardiovascular system: First and second heart sounds heard, regular. No JVD or murmurs or pedal edema. Telemetry shows sinus rhythm.  Gastrointestinal system: Abdomen is nondistended, soft and normal bowel sounds heard. Nontender.  Central nervous system: Alert and oriented to person and place and partly to time. No focal neurological deficits.  Extremities: Symmetric 5 x 5 power  Basic Metabolic Panel:  Lab [id number] 10/27/11 0500 10/26/11 0545 10/22/11 0522  NA 130* 140 136 --  K 4.2 3.8 3.9 --  CL 99 107 104 --  CO2 23 25 24  --  GLUCOSE 134* 60* 162* --  BUN 21 24* 29* --  CREATININE 0.91 0.88 0.95 --  CALCIUM 9.1 9.1 9.2 --  ALB -- -- -- --  PHOS -- -- -- 3.4   Liver Function Tests:  No results found for this basename: AST:3,ALT:3,ALKPHOS:3,BILITOT:3,PROT:3,ALBUMIN:3 in the last 168 hours No results found for this basename: LIPASE:3,AMYLASE:3 in the last 168 hours No results found for this basename: AMMONIA:3 in the last 168 hours CBC:  Lab 10/28/11 0532 10/27/11 0500 10/26/11 0545 10/25/11 0720 10/24/11 0535  WBC 7.8 7.5 8.2 -- --  NEUTROABS -- -- -- -- --  HGB 9.7* 9.0* 9.0* -- --  HCT 29.0* 27.3* 27.5* -- --  MCV 92.4 93.5 93.9 94.1 95.8  PLT 246 225 213 -- --   Cardiac Enzymes: No results found for this basename: CKTOTAL:5,CKMB:5,CKMBINDEX:5,TROPONINI:5 in the last 168 hours CBG:  Lab 10/28/11 0747 10/28/11 0503 10/28/11 0010 10/27/11 2115 10/27/11 1601  GLUCAP 118* 134* 184* 207* 214*   Other lab data:  1. ABG 6/19: PH 7.534, PCO2 29, PO2 56, bicarbonate 24 and oxygen saturation 89%. 2. Magnesium on 7/5:2.4 3. Ammonia on 7/1:13 4. Uric acid: 9.9. 5. ProBNP on 6/15:396. 6. Serum folate:  10.6. 7. Serum protein electrophoresis: M spike present. Two restricted bands consistent with monoclonal proteins are present. The monoclonal protein peaks account for 1.51 g/dL of the total 1.91 g/dL of protein in the beta-2 region and 0.15 g/dL of the total 0.32 g/dL of protein in the gamma region. Results are consistent with SPE performed on 10/13/11 Suggest serum IFE to evaluate possibility, if clinically indicated 8. [id number] IgG lambda protein is present. Monoclonal free lambda light chains present. 9. IgG 2740, IgG 154 and IgM 18.  10. A.m. cortisol: 6/18:42 11. Hemoglobin A1c: 10.6. 12. PTH: 10. 13. TSH: 0.892.  14. UA on 6/30: Not indicative of UTI.  15. UPEP: Elevated free kappa and lambda light chains 16. Urine IFE: Urine IFE shows a monoclonal IgG heavy chain with associated lambda light chain and excess monoclonal free lambda light chains (Bence Jones proteins).  Ct Abdomen Pelvis Wo Contrast  10/03/2011  *RADIOLOGY REPORT*  Clinical Data: Bilateral pedal edema.  History of chronic renal insufficiency, hypertension, diabetes and elevated PSA level.  CT ABDOMEN AND PELVIS WITHOUT CONTRAST  :IMPRESSION: 1.  Small amount of mesenteric edema and small volume of ascites. 2.  Biliary sludge layering dependently within the lumen of the gallbladder.  No signs of acute cholecystitis at this time. 3.  Normal appendix. 4.  Extensive left lower lobe airspace consolidation most compatible with pneumonia. 5.  Atherosclerosis.  6.  Small hiatal hernia. 7.  Soft tissue lesion just beneath the right inguinal canal suspicious for an incompletely distended testicle, as above.  Original Report Authenticated By: Etheleen Mayhew, M.D.   Dg Chest 2 View  10/10/2011  *RADIOLOGY REPORT*  Clinical Data: Follow-up pneumonia.  Cough and congestion.  CHEST - 2 VIEW  :IMPRESSION: Relatively unchanged chest radiograph with continued left lower lobe airspace disease/consolidation and probable effusion.  Stable  right basilar atelectasis/airspace disease.  Original Report Authenticated By: Lura Em, M.D.   Dg Chest 2 View  10/03/2011  *RADIOLOGY REPORT*  Clinical Data: Bilateral leg swelling and pain for one week.  CHEST - 2 VIEW  :IMPRESSION: Left lower lobe airspace opacity raises concern for pneumonia. However, this does not appear to match the patient's clinical history; suggest clinical correlation and further evaluation as deemed appropriate.  Original Report Authenticated By: Santa Lighter, M.D.   Dg Abd 1 View  10/23/2011  *RADIOLOGY REPORT*  Clinical Data: Feeding tube placement.  PORTABLE ABDOMEN - 1 VIEW 10/23/2011 :IMPRESSION:  1.  Feeding tube looped upon itself in the stomach with  its tip back up in the distal esophagus.  This should be withdrawn several centimeters and re-advanced. 2.  No acute abdominal abnormality.  Original Report Authenticated By: Deniece Portela, M.D.   Ct Head Wo Contrast  10/17/2011  *RADIOLOGY REPORT*  Clinical Data: Altered mental status  CT HEAD WITHOUT CONTRAST  :IMPRESSION:  1.  No acute intracranial abnormalities.  Original Report Authenticated By: Angelita Ingles, M.D.   Mr Brain Wo Contrast  10/17/2011  *RADIOLOGY REPORT*  Clinical Data: Altered mental status.  Weakness.  Decreased level of consciousness.  MRI HEAD WITHOUT CONTRAST  :IMPRESSION: Generalized brain atrophy.  Mild chronic small vessel change.  No acute or subacute to intracranial finding.  The ventricles are prominent, but this is in proportion to the degree of generalized atrophy. However, I think the ventricles are slightly larger on the CT scan done today when compared to last December.  This raises the possibility of hydrocephalus.  Fluid throughout both mastoid air cell regions.  These could be sterile mastoid effusions or indicate mastoiditis.  Original Report Authenticated By: Jules Schick, M.D.   US Renal  10/11/2011  *RADIOLOGY REPORT*  Clinical Data: Evaluate for potential  hydronephrosis.  RENAL/URINARY TRACT ULTRASOUND COMPLETE  :IMPRESSION:  1.  No focal renal abnormalities.  Specifically, no evidence of hydronephrosis. 2.  Prostatomegaly.  Original Report Authenticated By: Etheleen Mayhew, M.D.   Dg Chest Port 1 View  10/06/2011  *RADIOLOGY REPORT*  Clinical Data: Fever, shortness of breath and cough.  PORTABLE CHEST - 1 VIEW  : IMPRESSION: Worsened left effusion and airspace disease compatible with progressive pneumonia.  Mild right basilar opacity is also now identified.  Original Report Authenticated By: Arvid Right. D'ALESSIO, M.D.   Dg Abd Portable 1v  10/23/2011  *RADIOLOGY REPORT*  Clinical Data: Evaluate feeding tube  PORTABLE ABDOMEN - 1 VIEW  : IMPRESSION: Mild advancement of feeding tube with tip in the gastric fundus/body.  Original Report Authenticated By: Suzy Bouchard, M.D.   Dg Abd Portable 1v  10/23/2011  *RADIOLOGY REPORT*  Clinical Data: Feeding tube repositioned.  PORTABLE ABDOMEN - 1 VIEW   IMPRESSION:  1.  Feeding tube tip in the proximal stomach. 2.  No significant change in bibasilar airspace opacity with an appearance most compatible with pneumonia.  Original Report Authenticated By: Gerald Stabs, M.D.   Dg Abd Portable 1v  10/22/2011  *RADIOLOGY REPORT*  Clinical Data: Confirm NG tube placement.  PORTABLE ABDOMEN - 1 VIEW   IMPRESSION: NG tube tip in the proximal to mid stomach.  Original Report Authenticated By: Raelyn Number, M.D.   Dg Abd Portable 1v  10/21/2011  *RADIOLOGY REPORT*  Clinical Data: NG tube placement.  PORTABLE ABDOMEN - 1 VIEW  IMPRESSION: Nasogastric tube tip gastric antrum level.  Basilar lung with parenchymal changes more notable on the left incompletely assessed on the present exam.  Chest x-ray can be obtained for further delineation.  Original Report Authenticated By: Doug Sou, M.D.   Dg Abd Portable 1v  10/21/2011  *RADIOLOGY REPORT*  Clinical Data: Adjustment of the feeding tube  PORTABLE ABDOMEN - 1 VIEW     IMPRESSION: NG feeding tube coiled within stomach with tip coiled back in the esophagus about 8 cm above the GE junction.  This was made a call report to [place] By: Lahoma Crocker, M.D.   Dg Abd Portable 1v  10/21/2011  *RADIOLOGY REPORT*  Clinical Data: Evaluate feeding tube placement  PORTABLE  ABDOMEN - 1 VIEW   IMPRESSION:   Feeding tube coils in the distal antrum of the stomach, with the tip within the fundus of the stomach.  Original Report Authenticated By: Joretta Bachelor, M.D.   Dg Abd Portable 1v  10/20/2011  *RADIOLOGY REPORT*  Clinical Data: Panda tube placement  PORTABLE ABDOMEN - 1 VIEW  IMPRESSION: NG feeding tube coiled within stomach with tip in proximal stomach.  Original Report Authenticated By: Lahoma Crocker, M.D.   Dg Addison Bailey G Tube Plc W/fl W/rad  10/21/2011  *RADIOLOGY REPORT*  Clinical Data: Placement of feeding tube  Comparison:  Abdomen film of 10/21/2011 - - - 3.44-minute of fluoroscopy time  Findings: Using fluoroscopy the feeding tube was advanced through the stomach with the tip placed in the descending duodenum, documented by injection of a small amount of contrast.  The patient tolerated the procedure well.  IMPRESSION: Feeding tube placed with tip in the descending duodenum.  Original Report Authenticated By: Joretta Bachelor, M.D.   Dg Swallowing Func-no Report  10/27/2011  CLINICAL DATA: aspiration/ dysphagia   FLUOROSCOPY FOR SWALLOWING FUNCTION STUDY:  Fluoroscopy was provided for swallowing function study, which was  administered by a speech pathologist.  Final results and recommendations  from this study are contained within the speech pathology report.     EEG on 10/19/11.  CLINICAL INTERPRETATION: This routine EEG done with the patient awake and drowsy is abnormal. Activities during wakefulness consisted of polymorphic delta activities, suggest moderate encephalopathy of nonspecific etiology.  Bilateral lower extremity venous  Dopplers:  Summary:  - No obvious evidence of deep vein thrombosis involving the right lower extremity and left lower extremity. The right peroneal veins are non-compressible, therefore thrombosis cannot be ruled out in this area. - No evidence of Baker's cyst on the right or left.     Disposition:  Discharge to skilled nursing facility in stable condition.  Diet:  Dysphagia 3 diet with diabetic and heart healthy modifications and thin liquids.  Activity:  Increase activity gradually   Follow-up Appts: Discharge Orders    Future Appointments: Provider: Department: Dept Phone: Center:   12/31/2011 10:00 AM Renato Shin, MD [facility] [phone] [facility]     Future Orders Please Complete By Expires   Increase activity slowly      Discharge instructions      Comments:   Diet: Dysphagia 3 (mechanical soft), with diabetic and heart healthy modifications and thin liquids.   Call MD for:  temperature >100.4      Call MD for:  severe uncontrolled pain      Call MD for:  difficulty breathing, headache or visual disturbances           Time spent on discharge, talking to the patient, and coordinating care: 60 mins.  Called and discussed with patient's son Mr.Lahue,Phillip, updated care and discharge plans and answered questions.  SignedVernell Leep, MD 10/28/2011, 11:43 AM

## 2011-10-28 NOTE — Progress Notes (Signed)
CSW re-faxed patient out with updated clinicals.  Rhea Pink, MSW, [place]

## 2011-10-28 NOTE — Progress Notes (Signed)
Clinical social worker assisted with patient discharge to skilled nursing facility, [facility].  CSW addressed all family questions and concerns. CSW copied chart and added all important documents. CSW also set up patient transportation with Diplomatic Services operational officer. Clinical Social Worker will sign off for now as social work intervention is no longer needed.   Rhea Pink, MSW, [place]

## 2011-10-28 NOTE — Progress Notes (Signed)
Speech Language Pathology Dysphagia Treatment Patient Details Name: Eric [name] MRN: [id number] DOB: February 24, 1924 Today's Date: 10/28/2011 Time: [id number] SLP Time Calculation (min): 11 min  Assessment / Plan / Recommendation Clinical Impression  Pt. seen for dysphagia after having MBS yesterday.  He required mod verbal cues for smaller sips and to slow down.  Large sips, increased rate and increased velocity with straw contributing to s/s aspiration.  Mild left labial residue with cracker.  Recommend he continue Dys 3 and thin liquids.  Recommend he not use straws.  Reiterated precautions and clinical reasoning with pt.  Will continue to see while here and recommend ST f/u at SNF.    Diet Recommendation  Continue with Current Diet: Dysphagia 3 (mechanical soft);Thin liquid    SLP Plan Continue with current plan of care      Swallowing Goals  SLP Swallowing Goals Patient will consume recommended diet without observed clinical signs of aspiration with: Supervision/safety Swallow Study Goal #1 - Progress: Progressing toward goal Patient will utilize recommended strategies during swallow to increase swallowing safety with: Supervision/safety Swallow Study Goal #2 - Progress: Not met Swallow Study Goal #3 - Progress: Met  General Temperature Spikes Noted: No Respiratory Status: Room air Behavior/Cognition: Alert;Cooperative;Pleasant mood Oral Cavity - Dentition: Adequate natural dentition Patient Positioning: Upright in chair  Oral Cavity - Oral Hygiene Does patient have any of the following "at risk" factors?: Other - dysphagia Brush patient's teeth BID with toothbrush (using toothpaste with fluoride): Yes Patient is AT RISK - Oral Care Protocol followed (see row info): Yes   Dysphagia Treatment Treatment focused on: Skilled observation of diet tolerance;Patient/family/caregiver education Treatment Methods/Modalities: Skilled observation Patient observed directly with PO's:  Yes Type of PO's observed: Dysphagia 3 (soft);Thin liquids Feeding: Able to feed self Liquids provided via: Straw;Cup Oral Phase Signs & Symptoms: Left pocketing;Other (comment) (min left labial residue) Pharyngeal Phase Signs & Symptoms: Delayed cough Type of cueing: Verbal Amount of cueing: Moderate   Orbie Pyo [place] [phone]  10/28/2011

## 2011-11-03 ENCOUNTER — Telehealth: Payer: Self-pay | Admitting: Oncology

## 2011-11-03 NOTE — Telephone Encounter (Signed)
lmonvm of the pt's caregiver requesting a return call for [place] to schedule the pt for an appt  Here at [facility]

## 2011-11-11 ENCOUNTER — Ambulatory Visit: Payer: Medicare Other | Admitting: Oncology

## 2011-11-28 ENCOUNTER — Other Ambulatory Visit: Payer: Self-pay | Admitting: Endocrinology

## 2011-12-30 ENCOUNTER — Telehealth: Payer: Self-pay | Admitting: *Deleted

## 2011-12-30 NOTE — Telephone Encounter (Signed)
[facility] RN called to report that pt is now in a facility after being discharged from [facility]. Pt's BP yesterday by [facility] was 150/98 and by facility RN was 170/105. They also reported that pt's CBG's having been running in the 400's. [facility] is calling for MD's parameters on when to report BP and CBG-please advise.

## 2011-12-30 NOTE — Telephone Encounter (Signed)
Options: Ov here tomorrow, or: House dr can address

## 2011-12-31 ENCOUNTER — Ambulatory Visit: Payer: Medicare Other | Admitting: Endocrinology

## 2011-12-31 DIAGNOSIS — Z0289 Encounter for other administrative examinations: Secondary | ICD-10-CM

## 2011-12-31 NOTE — Telephone Encounter (Signed)
Pt has appointment today at 10:00am.

## 2012-01-03 ENCOUNTER — Telehealth: Payer: Self-pay | Admitting: *Deleted

## 2012-01-03 NOTE — Telephone Encounter (Signed)
[facility] OT called to report pt's BP-BP was 180/100 sitting, transfer to wheelchair at bedside 200/80, after 5 minute rest 180/90 sitting-no other activity.

## 2012-01-03 NOTE — Telephone Encounter (Signed)
Eric [name] OT informed of MD's advisement via VM and to callback office with any questions/concerns.

## 2012-01-03 NOTE — Telephone Encounter (Signed)
Ov next few days

## 2012-01-04 ENCOUNTER — Other Ambulatory Visit (INDEPENDENT_AMBULATORY_CARE_PROVIDER_SITE_OTHER): Payer: Medicare Other

## 2012-01-04 ENCOUNTER — Ambulatory Visit (INDEPENDENT_AMBULATORY_CARE_PROVIDER_SITE_OTHER): Payer: Medicare Other | Admitting: Endocrinology

## 2012-01-04 ENCOUNTER — Encounter: Payer: Self-pay | Admitting: Endocrinology

## 2012-01-04 VITALS — BP 162/92 | HR 80 | Temp 98.0°F | Ht 70.0 in | Wt 144.0 lb

## 2012-01-04 DIAGNOSIS — D649 Anemia, unspecified: Secondary | ICD-10-CM

## 2012-01-04 DIAGNOSIS — E1039 Type 1 diabetes mellitus with other diabetic ophthalmic complication: Secondary | ICD-10-CM

## 2012-01-04 DIAGNOSIS — J189 Pneumonia, unspecified organism: Secondary | ICD-10-CM

## 2012-01-04 DIAGNOSIS — E1065 Type 1 diabetes mellitus with hyperglycemia: Secondary | ICD-10-CM

## 2012-01-04 DIAGNOSIS — H579 Unspecified disorder of eye and adnexa: Secondary | ICD-10-CM

## 2012-01-04 LAB — CBC WITH DIFFERENTIAL/PLATELET
Basophils Absolute: 0 10*3/uL (ref 0.0–0.1)
Eosinophils Relative: 1.3 % (ref 0.0–5.0)
HCT: 42.5 % (ref 39.0–52.0)
Hemoglobin: 13.8 g/dL (ref 13.0–17.0)
Lymphs Abs: 1.8 10*3/uL (ref 0.7–4.0)
MCV: 94.1 fl (ref 78.0–100.0)
Monocytes Absolute: 0.2 10*3/uL (ref 0.1–1.0)
Monocytes Relative: 2.5 % — ABNORMAL LOW (ref 3.0–12.0)
Neutro Abs: 4.4 10*3/uL (ref 1.4–7.7)
Platelets: 249 10*3/uL (ref 150.0–400.0)
RDW: 16.2 % — ABNORMAL HIGH (ref 11.5–14.6)

## 2012-01-04 LAB — GLUCOSE, POCT (MANUAL RESULT ENTRY): POC Glucose: 506 mg/dl (ref 70–99)

## 2012-01-04 NOTE — Progress Notes (Signed)
Subjective:    Patient ID: Eric [name], male    DOB: 09-13-1923, 78 y.o.   MRN: [id number]  HPI The state of at least three ongoing medical problems is addressed today: DM: he brings a record of his cbg's which i have reviewed today.  cbg's vary from 200-400.  There is no trend throughout the day.  denies hypoglycemia.   Anemia: dnies brbpr  HTN: denies SOB Past Medical History  Diagnosis Date  . ANEMIA-NOS 02/17/2007  . DEPRESSION 12/25/2008  . HYPERTENSION 02/17/2007  . GERD 02/17/2007  . OSTEOARTHRITIS 03/04/2010  . DIABETES MELLITUS, TYPE I, CONTROLLED, WITH RETINOPATHY   . Gastroparesis   . BENIGN PROSTATIC HYPERTROPHY   . VITAMIN B12 DEFICIENCY   . BACK PAIN, LUMBAR   . LIVER DISORDER   . PSA, INCREASED   . Hyperlipidemia   . Diabetes mellitus   . Pneumonia   . Anxiety   . Encephalopathy, metabolic   . Altered mental status     Past Surgical History  Procedure Date  . Transurethral resection of prostate     History   Social History  . Marital Status: Widowed    Spouse Name: N/A    Number of Children: N/A  . Years of Education: N/A   Occupational History  . Not on file.   Social History Main Topics  . Smoking status: Never Smoker   . Smokeless tobacco: Never Used  . Alcohol Use: No  . Drug Use: No  . Sexually Active: No   Other Topics Concern  . 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   Current Outpatient Prescriptions on File Prior to Visit  Medication Sig Dispense Refill  . acetaminophen (TYLENOL) 500 MG tablet Take 1,000 mg by mouth every 6 (six) hours as needed. For pain.       Marland Kitchen aspirin 81 MG tablet Take 81 mg by mouth daily.        . capsaicin (ZOSTRIX) 0.025 % cream Apply topically 2 (two) times daily. Apply to both knees AVOID contact with eyes and broken or irritated skin.      . cyanocobalamin (,VITAMIN  B-12,) 1000 MCG/ML injection Inject 1,000 mcg into the muscle every 30 (thirty) days.      . darbepoetin (ARANESP) 100 MCG/0.5ML SOLN Inject 0.5 mLs (100 mcg total) into the skin every Monday at 6 PM.      . donepezil (ARICEPT) 5 MG tablet Take 5 mg by mouth at bedtime.       . insulin glargine (LANTUS) 100 UNIT/ML injection Inject 20 Units into the skin every morning.       . isosorbide mononitrate (ISMO,MONOKET) 10 MG tablet Take 1 tablet (10 mg total) by mouth 2 (two) times daily.  180 tablet  1  . levalbuterol (XOPENEX) 0.63 MG/3ML nebulizer solution Take 3 mLs (0.63 mg total) by nebulization every 4 (four) hours as needed for wheezing or shortness of breath.      . nitroGLYCERIN (NITROSTAT) 0.4 MG SL tablet Place 1 tablet (0.4 mg total) under the tongue every 5 (five) minutes as needed for chest pain.  10 tablet  0  . pravastatin (PRAVACHOL) 80 MG tablet Take 80 mg by mouth daily.      . prednisoLONE acetate (PRED FORTE) 1 % ophthalmic suspension Place 1 drop into both eyes daily.      Marland Kitchen triamterene-hydrochlorothiazide (MAXZIDE-25)  37.5-25 MG per tablet Take 1 tablet by mouth daily.         Allergies  Allergen Reactions  . Percocet (Oxycodone-Acetaminophen) Rash    Family History  Problem Relation Age of Onset  . Cancer Father     had uncertain type of cancer  . Cancer Brother     Prostate Cancer  . Cancer Brother     Prostate Cancer    BP 162/92  Pulse 80  Temp 98 F (36.7 C) (Oral)  Ht 5\' 10"  (1.778 m)  Wt 144 lb (65.318 kg)  BMI 20.66 kg/m2  SpO2 96%  Review of Systems He has lost weight.  He has constipation    Objective:   Physical Exam VITAL SIGNS:  See vs page GENERAL: no distress.  Lean body habitus.   LUNGS:  Clear to auscultation Ext: 1+ bilat leg edema     Assessment & Plan:  DM, needs increased rx HTN: needs increased rx Anemia, much better

## 2012-01-04 NOTE — Patient Instructions (Addendum)
Increase lantus to 20 units qam Stop glimepiride Please come back for a follow-up appointment in 2 weeks. Please add maxzide 1/2 of 37.5/25, po daily. blood tests, and a chest-x-ray, are being requested for you today.  We'll notify your facility of result.

## 2012-01-05 ENCOUNTER — Emergency Department (HOSPITAL_COMMUNITY): Payer: Medicare Other

## 2012-01-05 ENCOUNTER — Telehealth: Payer: Self-pay | Admitting: *Deleted

## 2012-01-05 ENCOUNTER — Inpatient Hospital Stay (HOSPITAL_COMMUNITY)
Admission: EM | Admit: 2012-01-05 | Discharge: 2012-01-10 | DRG: 077 | Disposition: A | Discharge status: Skilled Nursing Facility | Payer: Medicare Other | Attending: Internal Medicine | Admitting: Internal Medicine

## 2012-01-05 ENCOUNTER — Encounter (HOSPITAL_COMMUNITY): Payer: Self-pay | Admitting: Emergency Medicine

## 2012-01-05 DIAGNOSIS — M545 Low back pain, unspecified: Secondary | ICD-10-CM

## 2012-01-05 DIAGNOSIS — Z66 Do not resuscitate: Secondary | ICD-10-CM | POA: Diagnosis present

## 2012-01-05 DIAGNOSIS — J209 Acute bronchitis, unspecified: Secondary | ICD-10-CM

## 2012-01-05 DIAGNOSIS — M79609 Pain in unspecified limb: Secondary | ICD-10-CM

## 2012-01-05 DIAGNOSIS — R059 Cough, unspecified: Secondary | ICD-10-CM

## 2012-01-05 DIAGNOSIS — R531 Weakness: Secondary | ICD-10-CM

## 2012-01-05 DIAGNOSIS — G9340 Encephalopathy, unspecified: Secondary | ICD-10-CM

## 2012-01-05 DIAGNOSIS — R7989 Other specified abnormal findings of blood chemistry: Secondary | ICD-10-CM

## 2012-01-05 DIAGNOSIS — M25569 Pain in unspecified knee: Secondary | ICD-10-CM

## 2012-01-05 DIAGNOSIS — N183 Chronic kidney disease, stage 3 unspecified: Secondary | ICD-10-CM | POA: Diagnosis present

## 2012-01-05 DIAGNOSIS — D472 Monoclonal gammopathy: Secondary | ICD-10-CM

## 2012-01-05 DIAGNOSIS — J309 Allergic rhinitis, unspecified: Secondary | ICD-10-CM

## 2012-01-05 DIAGNOSIS — K769 Liver disease, unspecified: Secondary | ICD-10-CM

## 2012-01-05 DIAGNOSIS — R609 Edema, unspecified: Secondary | ICD-10-CM

## 2012-01-05 DIAGNOSIS — E538 Deficiency of other specified B group vitamins: Secondary | ICD-10-CM

## 2012-01-05 DIAGNOSIS — F329 Major depressive disorder, single episode, unspecified: Secondary | ICD-10-CM

## 2012-01-05 DIAGNOSIS — K219 Gastro-esophageal reflux disease without esophagitis: Secondary | ICD-10-CM

## 2012-01-05 DIAGNOSIS — R739 Hyperglycemia, unspecified: Secondary | ICD-10-CM

## 2012-01-05 DIAGNOSIS — IMO0002 Reserved for concepts with insufficient information to code with codable children: Secondary | ICD-10-CM | POA: Diagnosis present

## 2012-01-05 DIAGNOSIS — R109 Unspecified abdominal pain: Secondary | ICD-10-CM

## 2012-01-05 DIAGNOSIS — R9431 Abnormal electrocardiogram [ECG] [EKG]: Secondary | ICD-10-CM

## 2012-01-05 DIAGNOSIS — D649 Anemia, unspecified: Secondary | ICD-10-CM

## 2012-01-05 DIAGNOSIS — Y92009 Unspecified place in unspecified non-institutional (private) residence as the place of occurrence of the external cause: Secondary | ICD-10-CM

## 2012-01-05 DIAGNOSIS — I10 Essential (primary) hypertension: Secondary | ICD-10-CM

## 2012-01-05 DIAGNOSIS — F3289 Other specified depressive episodes: Secondary | ICD-10-CM

## 2012-01-05 DIAGNOSIS — R1310 Dysphagia, unspecified: Secondary | ICD-10-CM

## 2012-01-05 DIAGNOSIS — R4182 Altered mental status, unspecified: Secondary | ICD-10-CM | POA: Diagnosis present

## 2012-01-05 DIAGNOSIS — E785 Hyperlipidemia, unspecified: Secondary | ICD-10-CM

## 2012-01-05 DIAGNOSIS — R300 Dysuria: Secondary | ICD-10-CM

## 2012-01-05 DIAGNOSIS — F0390 Unspecified dementia without behavioral disturbance: Secondary | ICD-10-CM | POA: Diagnosis present

## 2012-01-05 DIAGNOSIS — R634 Abnormal weight loss: Secondary | ICD-10-CM

## 2012-01-05 DIAGNOSIS — I129 Hypertensive chronic kidney disease with stage 1 through stage 4 chronic kidney disease, or unspecified chronic kidney disease: Secondary | ICD-10-CM | POA: Diagnosis present

## 2012-01-05 DIAGNOSIS — N400 Enlarged prostate without lower urinary tract symptoms: Secondary | ICD-10-CM

## 2012-01-05 DIAGNOSIS — I4891 Unspecified atrial fibrillation: Secondary | ICD-10-CM

## 2012-01-05 DIAGNOSIS — M25529 Pain in unspecified elbow: Secondary | ICD-10-CM

## 2012-01-05 DIAGNOSIS — R05 Cough: Secondary | ICD-10-CM

## 2012-01-05 DIAGNOSIS — I674 Hypertensive encephalopathy: Principal | ICD-10-CM | POA: Diagnosis present

## 2012-01-05 DIAGNOSIS — G9341 Metabolic encephalopathy: Secondary | ICD-10-CM

## 2012-01-05 DIAGNOSIS — E1039 Type 1 diabetes mellitus with other diabetic ophthalmic complication: Secondary | ICD-10-CM | POA: Diagnosis present

## 2012-01-05 DIAGNOSIS — M1990 Unspecified osteoarthritis, unspecified site: Secondary | ICD-10-CM

## 2012-01-05 DIAGNOSIS — E11649 Type 2 diabetes mellitus with hypoglycemia without coma: Secondary | ICD-10-CM

## 2012-01-05 DIAGNOSIS — R627 Adult failure to thrive: Secondary | ICD-10-CM | POA: Diagnosis present

## 2012-01-05 DIAGNOSIS — E871 Hypo-osmolality and hyponatremia: Secondary | ICD-10-CM

## 2012-01-05 DIAGNOSIS — K3184 Gastroparesis: Secondary | ICD-10-CM

## 2012-01-05 DIAGNOSIS — R55 Syncope and collapse: Secondary | ICD-10-CM

## 2012-01-05 DIAGNOSIS — M6281 Muscle weakness (generalized): Secondary | ICD-10-CM

## 2012-01-05 DIAGNOSIS — E870 Hyperosmolality and hypernatremia: Secondary | ICD-10-CM | POA: Diagnosis present

## 2012-01-05 DIAGNOSIS — J189 Pneumonia, unspecified organism: Secondary | ICD-10-CM

## 2012-01-05 DIAGNOSIS — W19XXXA Unspecified fall, initial encounter: Secondary | ICD-10-CM

## 2012-01-05 DIAGNOSIS — R040 Epistaxis: Secondary | ICD-10-CM

## 2012-01-05 DIAGNOSIS — R972 Elevated prostate specific antigen [PSA]: Secondary | ICD-10-CM

## 2012-01-05 DIAGNOSIS — R600 Localized edema: Secondary | ICD-10-CM | POA: Diagnosis present

## 2012-01-05 DIAGNOSIS — F411 Generalized anxiety disorder: Secondary | ICD-10-CM | POA: Diagnosis present

## 2012-01-05 DIAGNOSIS — Z9181 History of falling: Secondary | ICD-10-CM

## 2012-01-05 DIAGNOSIS — E11319 Type 2 diabetes mellitus with unspecified diabetic retinopathy without macular edema: Secondary | ICD-10-CM | POA: Diagnosis present

## 2012-01-05 HISTORY — DX: Altered mental status, unspecified: R41.82

## 2012-01-05 LAB — POCT I-STAT 3, VENOUS BLOOD GAS (G3P V)
Acid-Base Excess: 3 mmol/L — ABNORMAL HIGH (ref 0.0–2.0)
O2 Saturation: 81 %
pO2, Ven: 46 mmHg — ABNORMAL HIGH (ref 30.0–45.0)

## 2012-01-05 LAB — URINALYSIS, ROUTINE W REFLEX MICROSCOPIC
Bilirubin Urine: NEGATIVE
Ketones, ur: NEGATIVE mg/dL
Nitrite: NEGATIVE
Specific Gravity, Urine: 1.024 (ref 1.005–1.030)
pH: 6 (ref 5.0–8.0)

## 2012-01-05 LAB — URINE MICROSCOPIC-ADD ON

## 2012-01-05 LAB — CBC WITH DIFFERENTIAL/PLATELET
Hemoglobin: 15.9 g/dL (ref 13.0–17.0)
Lymphocytes Relative: 14 % (ref 12–46)
Lymphs Abs: 1.4 10*3/uL (ref 0.7–4.0)
Monocytes Relative: 2 % — ABNORMAL LOW (ref 3–12)
Neutro Abs: 8.2 10*3/uL — ABNORMAL HIGH (ref 1.7–7.7)
Neutrophils Relative %: 83 % — ABNORMAL HIGH (ref 43–77)
Platelets: 195 10*3/uL (ref 150–400)
RBC: 5.13 MIL/uL (ref 4.22–5.81)
WBC: 9.8 10*3/uL (ref 4.0–10.5)

## 2012-01-05 LAB — LACTIC ACID, PLASMA: Lactic Acid, Venous: 2 mmol/L (ref 0.5–2.2)

## 2012-01-05 LAB — COMPREHENSIVE METABOLIC PANEL
ALT: 18 U/L (ref 0–53)
Albumin: 3.1 g/dL — ABNORMAL LOW (ref 3.5–5.2)
Alkaline Phosphatase: 127 U/L — ABNORMAL HIGH (ref 39–117)
BUN: 21 mg/dL (ref 6–23)
Chloride: 98 mEq/L (ref 96–112)
GFR calc Af Amer: 86 mL/min — ABNORMAL LOW (ref >90)
Glucose, Bld: 336 mg/dL — ABNORMAL HIGH (ref 70–99)
Potassium: 4.5 mEq/L (ref 3.5–5.1)
Sodium: 135 mEq/L (ref 135–145)
Total Bilirubin: 0.4 mg/dL (ref 0.3–1.2)

## 2012-01-05 LAB — PRO B NATRIURETIC PEPTIDE: Pro B Natriuretic peptide (BNP): 581.9 pg/mL — ABNORMAL HIGH (ref 0–450)

## 2012-01-05 MED ORDER — LABETALOL HCL 5 MG/ML IV SOLN
10.0000 mg | Freq: Once | INTRAVENOUS | Status: AC
  Administered 2012-01-05: 10 mg via INTRAVENOUS
  Filled 2012-01-05: qty 4

## 2012-01-05 MED ORDER — SODIUM CHLORIDE 0.9 % IV SOLN
Freq: Once | INTRAVENOUS | Status: AC
  Administered 2012-01-05: 23:00:00 via INTRAVENOUS

## 2012-01-05 NOTE — Telephone Encounter (Signed)
D/c aranesp

## 2012-01-05 NOTE — ED Provider Notes (Signed)
History     CSN: [id number]  Arrival date & time 01/05/12  2007   First MD Initiated Contact with Patient 01/05/12 2047      Chief Complaint  Patient presents with  . Hypertension    (Consider location/radiation/quality/duration/timing/severity/associated sxs/prior treatment) Patient is a 78 y.o. male presenting with neurologic complaint. History provided by: son.  Neurologic Problem The primary symptoms include altered mental status and vomiting (mild). Primary symptoms do not include headaches, fever or nausea. Episode onset: today. The symptoms are waxing and waning. The neurological symptoms are diffuse. Context: while at rest.  The change in mental status began today. The altered mental status developed insidiously. The change in mental status has been unchanged since its onset. The change in mental status includes confusion.  The vomiting began today. Vomiting occurred once. The emesis contains stomach contents.  Additional symptoms include weakness.    Past Medical History  Diagnosis Date  . ANEMIA-NOS 02/17/2007  . DEPRESSION 12/25/2008  . HYPERTENSION 02/17/2007  . GERD 02/17/2007  . OSTEOARTHRITIS 03/04/2010  . DIABETES MELLITUS, TYPE I, CONTROLLED, WITH RETINOPATHY   . Gastroparesis   . BENIGN PROSTATIC HYPERTROPHY   . VITAMIN B12 DEFICIENCY   . BACK PAIN, LUMBAR   . LIVER DISORDER   . PSA, INCREASED   . Hyperlipidemia   . Diabetes mellitus   . Pneumonia   . Anxiety   . Encephalopathy, metabolic   . Altered mental status     Past Surgical History  Procedure Date  . Transurethral resection of prostate     Family History  Problem Relation Age of Onset  . Cancer Father     had uncertain type of cancer  . Cancer Brother     Prostate Cancer  . Cancer Brother     Prostate Cancer    History  Substance Use Topics  . Smoking status: Never Smoker   . Smokeless tobacco: Never Used  . Alcohol Use: No      Review of Systems  Constitutional: Negative  for fever.  HENT: Negative for rhinorrhea, drooling and neck pain.   Eyes: Negative for pain.  Respiratory: Negative for cough and shortness of breath.   Cardiovascular: Negative for chest pain and leg swelling.  Gastrointestinal: Positive for vomiting (mild). Negative for nausea, abdominal pain and diarrhea.  Genitourinary: Negative for dysuria and hematuria.  Musculoskeletal: Negative for gait problem.  Skin: Negative for color change.  Neurological: Positive for weakness. Negative for numbness and headaches.  Hematological: Negative for adenopathy.  Psychiatric/Behavioral: Positive for altered mental status. Negative for behavioral problems.  All other systems reviewed and are negative.    Allergies  Review of patient's allergies indicates no known allergies.  Home Medications   Current Outpatient Rx  Name Route Sig Dispense Refill  . ACETAMINOPHEN 500 MG PO TABS Oral Take 1,000 mg by mouth every 6 (six) hours as needed. For pain.     . ASPIRIN 81 MG PO TABS Oral Take 81 mg by mouth daily.      Marland Kitchen CALCITRIOL 0.25 MCG PO CAPS Oral Take 0.25 mcg by mouth daily.     Marland Kitchen CAPSAICIN 0.025 % EX CREA Topical Apply topically 2 (two) times daily. Apply to both knees AVOID contact with eyes and broken or irritated skin.    Marland Kitchen CYANOCOBALAMIN 1000 MCG/ML IJ SOLN Intramuscular Inject 1,000 mcg into the muscle every 30 (thirty) days.    Marland Kitchen DARBEPOETIN ALFA-POLYSORBATE 100 MCG/0.5ML IJ SOLN Subcutaneous Inject 0.5 mLs (100 mcg total) into  the skin every Monday at 6 PM.    . DONEPEZIL HCL 5 MG PO TABS Oral Take 5 mg by mouth at bedtime.     . INSULIN GLARGINE 100 UNIT/ML [place] SOLN Subcutaneous Inject 20 Units into the skin every morning.     . ISOSORBIDE MONONITRATE 10 MG PO TABS Oral Take 1 tablet (10 mg total) by mouth 2 (two) times daily. 180 tablet 1  . LEVALBUTEROL HCL 0.63 MG/3ML IN NEBU Nebulization Take 3 mLs (0.63 mg total) by nebulization every 4 (four) hours as needed for wheezing or shortness  of breath.    Marland Kitchen NITROGLYCERIN 0.4 MG SL SUBL Sublingual Place 1 tablet (0.4 mg total) under the tongue every 5 (five) minutes as needed for chest pain. 10 tablet 0  . NON FORMULARY  Mighty Shake  1 by mouth twice daily at 10am and 4pm    . OMEPRAZOLE 20 MG PO CPDR Oral Take 40 mg by mouth daily.    Marland Kitchen PRAVASTATIN SODIUM 80 MG PO TABS Oral Take 80 mg by mouth daily.    Marland Kitchen PREDNISOLONE ACETATE 1 % OP SUSP Both Eyes Place 1 drop into both eyes daily.    . TRIAMTERENE-HCTZ 37.5-25 MG PO TABS Oral Take 1 tablet by mouth daily.     Marland Kitchen VITAMIN B-12 1000 MCG PO TABS Oral Take 1,000 mcg by mouth daily.      BP 240/113  Pulse 91  Temp 97.8 F (36.6 C) (Oral)  Resp 18  SpO2 99%  Physical Exam  Nursing note and vitals reviewed. Constitutional: He is oriented to person, place, and time. He appears well-developed and well-nourished.  HENT:  Head: Normocephalic and atraumatic.  Right Ear: External ear normal.  Left Ear: External ear normal.  Nose: Nose normal.  Mouth/Throat: Oropharynx is clear and moist. No oropharyngeal exudate.  Eyes: Conjunctivae normal and EOM are normal. Pupils are equal, round, and reactive to light.  Neck: Normal range of motion. Neck supple.  Cardiovascular: Normal rate, regular rhythm, normal heart sounds and intact distal pulses.  Exam reveals no gallop and no friction rub.   No murmur heard. Pulmonary/Chest: Effort normal and breath sounds normal. No respiratory distress. He has no wheezes.  Abdominal: Soft. Bowel sounds are normal. He exhibits no distension. There is no tenderness. There is no rebound and no guarding.  Musculoskeletal: Normal range of motion. He exhibits no edema and no tenderness.  Neurological: He is alert and oriented to person, place, and time. No cranial nerve deficit or sensory deficit. Coordination normal.       2/5 strength in bilateral LE's, 5/5 strenght in UE's.   Skin: Skin is warm and dry.  Psychiatric: He has a normal mood and affect.  His behavior is normal.    ED Course  Procedures (including critical care time)  Labs Reviewed - No data to display No results found.   No diagnosis found.   Date: 01/06/2012  Rate: 91  Rhythm: normal sinus rhythm  QRS Axis: normal  Intervals: normal  ST/T Wave abnormalities: normal  Conduction Disutrbances:borderline AV conduction delay  Narrative Interpretation: No new ST or T wave changes cw ischemia  Old EKG Reviewed: changes noted    MDM  9:06 PM 78 y.o. male w hx of HTN, DM, metabolic encephalopathy pw HTN and mild AMS. Son notes pt more drowsy today, less responsive. Pt's sys BP noted to be in the low 200's at facility. Pt has had mild emesis and dec ability to ambulate  w/ walker in last 1-2 weeks. Pt AFVSS here, a/o x3 on exam, no focal neuro deficits noted. Pt's BP sys in low 200's here, will give labetalol to lower. Will get screening labs, CT head.   Suspect hypertensive encephalopathy. Pt's sys BP down to 180's after several oto's of labetalol. Will admit to hospitalist.   Clinical Impression 1. Hypertensive encephalopathy   2. Hyperglycemia          Pamella Pert, MD 01/06/12 0110

## 2012-01-05 NOTE — Telephone Encounter (Signed)
ok 

## 2012-01-05 NOTE — Telephone Encounter (Signed)
Eric [name] from [facility] called-pt has not had a bowel movement since last Thursday and his bowel is impacted. Wanted verbal order for disimpaction-per Dr. Elvera Lennox Soap suds enema-[name] informed. They also would like standing order for constipation on file from MD-Please advise (informed her SAE out of office until tomorrow morning and standing orders would have to come from him).

## 2012-01-05 NOTE — ED Notes (Addendum)
Pt arrived by ems from [facility]. Staff obtained consecutive htn readings in 200's. Denies blurred vision; stroke screen negative, no headache. No complaints. Pt a&o at baseline

## 2012-01-05 NOTE — ED Provider Notes (Signed)
I saw and evaluated the patient, reviewed the resident's note and I agree with the findings and plan. 78 year old, male, with history of metabolic encephalopathy, and hypertension, was brought to the emergency department by his son because he had confusion compared to his baseline.  The patient has significant hypertension, and is slow to respond but does speak clearly.  He has no neurological deficits.  We will perform laboratory testing, and CAT scan of his head to try to determine if there is any evidence of endorgan damage.  Blood pressure, controlled in the emergency department.  The confusion persists.  We will admit him to the hospital for hypertensive encephalopathy  Barbara Cower, MD 01/05/12 2359

## 2012-01-05 NOTE — Telephone Encounter (Signed)
Collin from [facility] called to see if pt can be switched to another medication due to cost-Aranesp is too expensive ($3000 for a month's supply)-please advise.

## 2012-01-05 NOTE — ED Notes (Signed)
Pt is difficult historian. Does c/o of "slight headache" and reports having upset stomach. Denies chest pain or blurred vision.

## 2012-01-05 NOTE — Telephone Encounter (Signed)
[facility] informed.

## 2012-01-06 ENCOUNTER — Encounter (HOSPITAL_COMMUNITY): Payer: Self-pay | Admitting: Internal Medicine

## 2012-01-06 DIAGNOSIS — R109 Unspecified abdominal pain: Secondary | ICD-10-CM

## 2012-01-06 DIAGNOSIS — I674 Hypertensive encephalopathy: Principal | ICD-10-CM | POA: Diagnosis present

## 2012-01-06 DIAGNOSIS — J309 Allergic rhinitis, unspecified: Secondary | ICD-10-CM

## 2012-01-06 DIAGNOSIS — R739 Hyperglycemia, unspecified: Secondary | ICD-10-CM | POA: Insufficient documentation

## 2012-01-06 LAB — GLUCOSE, CAPILLARY
Glucose-Capillary: 278 mg/dL — ABNORMAL HIGH (ref 70–99)
Glucose-Capillary: 207 mg/dL — ABNORMAL HIGH (ref 70–99)
Glucose-Capillary: 120 mg/dL — ABNORMAL HIGH (ref 70–99)
Glucose-Capillary: 167 mg/dL — ABNORMAL HIGH (ref 70–99)

## 2012-01-06 LAB — POCT I-STAT 3, ART BLOOD GAS (G3+)
Bicarbonate: 26.1 mEq/L — ABNORMAL HIGH (ref 20.0–24.0)
Patient temperature: 98.6
TCO2: 27 mmol/L (ref 0–100)
pCO2 arterial: 42.1 mmHg (ref 35.0–45.0)
pH, Arterial: 7.4 (ref 7.350–7.450)
pO2, Arterial: 73 mmHg — ABNORMAL LOW (ref 80.0–100.0)

## 2012-01-06 LAB — CBC
Hemoglobin: 14 g/dL (ref 13.0–17.0)
MCH: 30.8 pg (ref 26.0–34.0)
Platelets: 235 10*3/uL (ref 150–400)
RBC: 4.54 MIL/uL (ref 4.22–5.81)

## 2012-01-06 LAB — CREATININE, SERUM
Creatinine, Ser: 0.96 mg/dL (ref 0.50–1.35)
GFR calc non Af Amer: 73 mL/min — ABNORMAL LOW (ref >90)

## 2012-01-06 LAB — BASIC METABOLIC PANEL
BUN: 19 mg/dL (ref 6–23)
Calcium: 9.8 mg/dL (ref 8.4–10.5)
Creatinine, Ser: 0.96 mg/dL (ref 0.50–1.35)
GFR calc non Af Amer: 73 mL/min — ABNORMAL LOW (ref >90)
Glucose, Bld: 250 mg/dL — ABNORMAL HIGH (ref 70–99)
Potassium: 3.8 mEq/L (ref 3.5–5.1)

## 2012-01-06 LAB — MRSA PCR SCREENING: MRSA by PCR: NEGATIVE

## 2012-01-06 LAB — TSH: TSH: 1.608 u[IU]/mL (ref 0.350–4.500)

## 2012-01-06 MED ORDER — ENOXAPARIN SODIUM 40 MG/0.4ML ~~LOC~~ SOLN
40.0000 mg | Freq: Every day | SUBCUTANEOUS | Status: DC
  Administered 2012-01-06 – 2012-01-10 (×5): 40 mg via SUBCUTANEOUS
  Filled 2012-01-06 (×5): qty 0.4

## 2012-01-06 MED ORDER — ADULT MULTIVITAMIN W/MINERALS CH
1.0000 | ORAL_TABLET | Freq: Every day | ORAL | Status: DC
  Administered 2012-01-06 – 2012-01-10 (×5): 1 via ORAL
  Filled 2012-01-06 (×5): qty 1

## 2012-01-06 MED ORDER — INSULIN ASPART 100 UNIT/ML ~~LOC~~ SOLN
0.0000 [IU] | Freq: Every day | SUBCUTANEOUS | Status: DC
  Administered 2012-01-06: 3 [IU] via SUBCUTANEOUS
  Filled 2012-01-06: qty 1

## 2012-01-06 MED ORDER — CYANOCOBALAMIN 1000 MCG/ML IJ SOLN: 1000.0000 ug | INTRAMUSCULAR | Status: DC

## 2012-01-06 MED ORDER — SODIUM CHLORIDE 0.9 % IV SOLN: 250.0000 mL | INTRAVENOUS | Status: DC | PRN

## 2012-01-06 MED ORDER — NITROGLYCERIN 0.4 MG SL SUBL: 0.4000 mg | SUBLINGUAL_TABLET | SUBLINGUAL | Status: DC | PRN

## 2012-01-06 MED ORDER — SODIUM CHLORIDE 0.9 % IJ SOLN: 3.0000 mL | INTRAMUSCULAR | Status: DC | PRN

## 2012-01-06 MED ORDER — INSULIN GLARGINE 100 UNIT/ML ~~LOC~~ SOLN
20.0000 [IU] | Freq: Every morning | SUBCUTANEOUS | Status: DC
  Administered 2012-01-06 – 2012-01-07 (×2): 20 [IU] via SUBCUTANEOUS

## 2012-01-06 MED ORDER — ASPIRIN 81 MG PO CHEW
81.0000 mg | CHEWABLE_TABLET | Freq: Every day | ORAL | Status: DC
  Administered 2012-01-06 – 2012-01-10 (×5): 81 mg via ORAL
  Filled 2012-01-06 (×3): qty 1

## 2012-01-06 MED ORDER — SODIUM CHLORIDE 0.9 % IJ SOLN
3.0000 mL | Freq: Two times a day (BID) | INTRAMUSCULAR | Status: DC
  Administered 2012-01-06 – 2012-01-09 (×6): 3 mL via INTRAVENOUS

## 2012-01-06 MED ORDER — PREDNISOLONE ACETATE 1 % OP SUSP
1.0000 [drp] | Freq: Every day | OPHTHALMIC | Status: DC
  Administered 2012-01-06 – 2012-01-10 (×5): 1 [drp] via OPHTHALMIC
  Filled 2012-01-06: qty 1

## 2012-01-06 MED ORDER — DARBEPOETIN ALFA-POLYSORBATE 100 MCG/0.5ML IJ SOLN
100.0000 ug | INTRAMUSCULAR | Status: DC
  Filled 2012-01-06: qty 0.5

## 2012-01-06 MED ORDER — ONDANSETRON HCL 4 MG PO TABS: 4.0000 mg | ORAL_TABLET | Freq: Four times a day (QID) | ORAL | Status: DC | PRN

## 2012-01-06 MED ORDER — CALCITRIOL 0.25 MCG PO CAPS
0.2500 ug | ORAL_CAPSULE | Freq: Every day | ORAL | Status: DC
  Administered 2012-01-06 – 2012-01-10 (×5): 0.25 ug via ORAL
  Filled 2012-01-06 (×5): qty 1

## 2012-01-06 MED ORDER — ISOSORBIDE MONONITRATE 10 MG PO TABS
10.0000 mg | ORAL_TABLET | Freq: Two times a day (BID) | ORAL | Status: DC
  Administered 2012-01-06 – 2012-01-08 (×5): 10 mg via ORAL
  Filled 2012-01-06 (×9): qty 1

## 2012-01-06 MED ORDER — ENSURE PUDDING PO PUDG
1.0000 | Freq: Three times a day (TID) | ORAL | Status: DC
  Administered 2012-01-06 – 2012-01-10 (×12): 1 via ORAL

## 2012-01-06 MED ORDER — VITAMIN B-12 1000 MCG PO TABS
1000.0000 ug | ORAL_TABLET | Freq: Every day | ORAL | Status: DC
  Administered 2012-01-06 – 2012-01-10 (×5): 1000 ug via ORAL
  Filled 2012-01-06 (×5): qty 1

## 2012-01-06 MED ORDER — DEXTROSE 5 % IV SOLN
1.0000 g | INTRAVENOUS | Status: DC
  Administered 2012-01-06 – 2012-01-08 (×3): 1 g via INTRAVENOUS
  Filled 2012-01-06 (×3): qty 10

## 2012-01-06 MED ORDER — LABETALOL HCL 5 MG/ML IV SOLN
10.0000 mg | Freq: Once | INTRAVENOUS | Status: AC
  Administered 2012-01-06: 10 mg via INTRAVENOUS
  Filled 2012-01-06: qty 4

## 2012-01-06 MED ORDER — ONDANSETRON HCL 4 MG/2ML IJ SOLN: 4.0000 mg | Freq: Four times a day (QID) | INTRAMUSCULAR | Status: DC | PRN

## 2012-01-06 MED ORDER — LABETALOL HCL 5 MG/ML IV SOLN
20.0000 mg | Freq: Once | INTRAVENOUS | Status: AC
  Administered 2012-01-06: 20 mg via INTRAVENOUS
  Filled 2012-01-06: qty 4

## 2012-01-06 MED ORDER — ASPIRIN 81 MG PO TABS: 81.0000 mg | ORAL_TABLET | Freq: Every day | ORAL | Status: DC

## 2012-01-06 MED ORDER — DEXTROSE 5 % IV SOLN
500.0000 mg | Freq: Every day | INTRAVENOUS | Status: DC
  Administered 2012-01-06 – 2012-01-08 (×3): 500 mg via INTRAVENOUS
  Filled 2012-01-06 (×4): qty 500

## 2012-01-06 MED ORDER — SODIUM CHLORIDE 0.9 % IJ SOLN
3.0000 mL | Freq: Two times a day (BID) | INTRAMUSCULAR | Status: DC
  Administered 2012-01-06 – 2012-01-09 (×5): 3 mL via INTRAVENOUS

## 2012-01-06 MED ORDER — HYDRALAZINE HCL 25 MG PO TABS
25.0000 mg | ORAL_TABLET | Freq: Three times a day (TID) | ORAL | Status: DC
  Administered 2012-01-06 – 2012-01-10 (×13): 25 mg via ORAL
  Filled 2012-01-06 (×16): qty 1

## 2012-01-06 MED ORDER — DONEPEZIL HCL 5 MG PO TABS
5.0000 mg | ORAL_TABLET | Freq: Every day | ORAL | Status: DC
  Administered 2012-01-06 – 2012-01-09 (×4): 5 mg via ORAL
  Filled 2012-01-06 (×5): qty 1

## 2012-01-06 MED ORDER — INSULIN ASPART 100 UNIT/ML ~~LOC~~ SOLN
0.0000 [IU] | Freq: Three times a day (TID) | SUBCUTANEOUS | Status: DC
  Administered 2012-01-06 (×2): 5 [IU] via SUBCUTANEOUS

## 2012-01-06 MED ORDER — SIMVASTATIN 10 MG PO TABS
10.0000 mg | ORAL_TABLET | Freq: Every day | ORAL | Status: DC
  Administered 2012-01-06 – 2012-01-09 (×4): 10 mg via ORAL
  Filled 2012-01-06 (×5): qty 1

## 2012-01-06 MED ORDER — LEVALBUTEROL HCL 0.63 MG/3ML IN NEBU: 0.6300 mg | INHALATION_SOLUTION | RESPIRATORY_TRACT | Status: DC | PRN

## 2012-01-06 MED ORDER — SODIUM CHLORIDE 0.9 % IV SOLN
INTRAVENOUS | Status: AC
  Administered 2012-01-06: 05:00:00 via INTRAVENOUS

## 2012-01-06 MED ORDER — HYDRALAZINE HCL 20 MG/ML IJ SOLN
10.0000 mg | INTRAMUSCULAR | Status: DC | PRN
  Administered 2012-01-06 – 2012-01-09 (×4): 10 mg via INTRAVENOUS
  Filled 2012-01-06: qty 1
  Filled 2012-01-06 (×3): qty 0.5

## 2012-01-06 MED ORDER — PANTOPRAZOLE SODIUM 40 MG PO TBEC
40.0000 mg | DELAYED_RELEASE_TABLET | Freq: Every day | ORAL | Status: DC
  Administered 2012-01-06 – 2012-01-10 (×5): 40 mg via ORAL
  Filled 2012-01-06 (×2): qty 1

## 2012-01-06 NOTE — Telephone Encounter (Signed)
Called [facility], line busy

## 2012-01-06 NOTE — Progress Notes (Signed)
Utilization Review Completed.  

## 2012-01-06 NOTE — Progress Notes (Signed)
INITIAL ADULT NUTRITION ASSESSMENT Date: 01/06/2012   Time: 2:22 PM Reason for Assessment: consult   INTERVENTION: 1. Ensure Pudding po TID, each supplement provides 170 kcal and 4 grams of protein.  2. Multivitamin  3. RD will continue to follow     [place] Per approved criteria  -Not Applicable    ASSESSMENT: Male 78 y.o.  Dx: Hypertensive encephalopathy  Hx:  Past Medical History  Diagnosis Date  . ANEMIA-NOS 02/17/2007  . DEPRESSION 12/25/2008  . HYPERTENSION 02/17/2007  . GERD 02/17/2007  . OSTEOARTHRITIS 03/04/2010  . DIABETES MELLITUS, TYPE I, CONTROLLED, WITH RETINOPATHY   . Gastroparesis   . BENIGN PROSTATIC HYPERTROPHY   . VITAMIN B12 DEFICIENCY   . BACK PAIN, LUMBAR   . LIVER DISORDER   . PSA, INCREASED   . Hyperlipidemia   . Diabetes mellitus   . Pneumonia   . Anxiety   . Encephalopathy, metabolic   . Altered mental status     Past Surgical History  Procedure Date  . Transurethral resection of prostate     Related Meds:     . sodium chloride   Intravenous Once  . sodium chloride   Intravenous STAT  . aspirin  81 mg Oral Daily  . azithromycin  500 mg Intravenous Daily  . calcitRIOL  0.25 mcg Oral Daily  . cefTRIAXone (ROCEPHIN)  IV  1 g Intravenous Q24H  . cyanocobalamin  1,000 mcg Intramuscular Q30 days  . darbepoetin  100 mcg Subcutaneous Q Mon-1800  . donepezil  5 mg Oral QHS  . enoxaparin (LOVENOX) injection  40 mg Subcutaneous Daily  . hydrALAZINE  25 mg Oral Q8H  . insulin aspart  0-15 Units Subcutaneous TID WC  . insulin aspart  0-5 Units Subcutaneous QHS  . insulin glargine  20 Units Subcutaneous q morning - 10a  . isosorbide mononitrate  10 mg Oral BID WC  . labetalol  10 mg Intravenous Once  . labetalol  10 mg Intravenous Once  . labetalol  10 mg Intravenous Once  . labetalol  20 mg Intravenous Once  . pantoprazole  40 mg Oral Q1200  . prednisoLONE acetate  1 drop Both Eyes Daily  . simvastatin  10 mg Oral q1800  .  sodium chloride  3 mL Intravenous Q12H  . sodium chloride  3 mL Intravenous Q12H  . vitamin B-12  1,000 mcg Oral Daily  . DISCONTD: aspirin  81 mg Oral Daily     Ht:    Ht Readings from Last 1 Encounters:  01/04/12 5\' 10"  (1.778 m)     Wt: 147 lb 14.9 oz (67.1 kg)  Ideal Wt:    75.5 kg  % Ideal Wt: 89%  Usual Wt:  Wt Readings from Last 10 Encounters:  01/06/12 147 lb 14.9 oz (67.1 kg)  01/04/12 144 lb (65.318 kg)  10/28/11 136 lb 0.4 oz (61.7 kg)  10/01/11 158 lb (71.668 kg)  07/29/11 158 lb 1.9 oz (71.723 kg)  06/11/11 157 lb 3.2 oz (71.305 kg)  06/01/11 155 lb (70.308 kg)  04/29/11 153 lb 12.8 oz (69.763 kg)  04/24/11 155 lb 5.6 oz (70.465 kg)  04/24/11 155 lb 5.6 oz (70.465 kg)      % Usual Wt: 95%  BMI = 21.2 kg /(m^2) , Pt is WNL per current BMI   Food/Nutrition Related Hx: Pt was eating well until the past week, per family   Labs:  CMP     Component Value Date/Time   NA  132* 01/06/2012 1130   K 3.8 01/06/2012 1130   CL 101 01/06/2012 1130   CO2 23 01/06/2012 1130   GLUCOSE 250* 01/06/2012 1130   BUN 19 01/06/2012 1130   CREATININE 0.96 01/06/2012 1130   CALCIUM 9.8 01/06/2012 1130   CALCIUM 8.8 04/29/2011 1005   PROT 8.5* 01/05/2012 2121   ALBUMIN 3.1* 01/05/2012 2121   AST 26 01/05/2012 2121   ALT 18 01/05/2012 2121   ALKPHOS 127* 01/05/2012 2121   BILITOT 0.4 01/05/2012 2121   GFRNONAA 73* 01/06/2012 1130   GFRAA 85* 01/06/2012 1130    Intake/Output Summary (Last 24 hours) at 01/06/12 1424 Last data filed at 01/06/12 1300  Gross per 24 hour  Intake    343 ml  Output      0 ml  Net    343 ml     Diet Order: Carb Control  Supplements/Tube Feeding: none   IVF:    Estimated Nutritional Needs:   Kcal: 1550-1750 Protein: 67-80 gm  Fluid: 1.6-1.8 L   Pt was [facility] from [facility]" after only one week for AMS with hypertension and hyperglycemia. Pt's niece provided hx as pt still with some AMS, and sleeping.  Pt was [facility] to SNF, did  well was able to self feed, walking with PT, and much improved. Then 1 week ago was transferred to a "retairment home" where pt was not being helped with ADL's. Niece states pt was not eating well or getting up. States BS were consistently 400-500.  Per weight hx, pt has gained weight from last admission. At last admission when pt with AMS, required feeding tube as pt was pocketing food. No reports of this behavior at this time, per RN ate well at lunch.   NUTRITION DIAGNOSIS: -Inadequate oral intake (NI-2.1).  Status: Ongoing  RELATED TO: AMS  AS EVIDENCE BY: family report of poor po x 1 week   MONITORING/EVALUATION(Goals): Goal: PO intake to meet 90-100% estimated nutrition needs  Monitor: PO intake, weight, labs, AMS  EDUCATION NEEDS: -No education needs identified at this time    Orson Slick RD, LDN Pager [phone] After Hours pager [phone]  01/06/2012, 2:22 PM

## 2012-01-06 NOTE — Progress Notes (Signed)
Patient ID: Eric [name], male   DOB: [birth year], 78 y.o.   MRN: [id number]  [facility] PROGRESS NOTE  Eric [name] [id number] DOB: 1923-07-22 DOA: 01/05/2012 PCP: Eric Shin, MD  Brief narrative: Pt is 78 yo male who was admitted 09/18 --> with change in mental status and of unclear etiology as pt was not able to provide history. In ED, BP found to be in 240's/140's  Principal Problem:  *Hypertensive encephalopathy - no significant change in mental status and this could be possibly related to ongoing and progressive dementia and failure to thrive - BP is better this AM but not at goal yet - will place on scheduled Hydralazine and will continue Isosorbide - monitor vitals per floor protocol  Active Problems:  Altered mental status - unclear etiology at this time but likely secondary to progressive failure to thrive and progressive, advancing dementia - unclear if pneumonia on CXR representing an acute infection  - will treat with empiric ABX - will place order for PT evaluation to assess pt's mobility   ? CAP - based on imaging and unclear based on symptoms as pt not able to provide history - will treat with empiric antibiotics   Failure to thrive in adult - nutrition consult   DM (diabetes mellitus), type 1, uncontrolled w/ophthalmic complication - continue Lantus as per home medication regimen and continue SSI   CKD (chronic kidney disease) stage 3, GFR 30-59 ml/min - based on GFR but creatinine is within normal limits - will repeat BMET since currently only I-STAT available - BMP in AM   Hypernatremia - will check BMET now and reassess - encourage PO intake   Bilateral leg edema - improving  Consultants:  None  Procedures/Studies: Ct Head Wo Contrast 01/06/2012  IMPRESSION:  Atrophy with chronic microvascular ischemic change.  No acute stroke or bleed.     Dg Chest Port 1 View 01/05/2012    IMPRESSION:  Patchy right basilar airspace disease  could be due to atelectasis or infection.     Antibiotics:  Zithromax 09/19 -->  Rocephin 09/19 -->  Code Status: DNR Family Communication: Son at bedside Disposition Plan: PT evaluation  HPI/Subjective: No events overnight.   Objective: Filed Vitals:   01/06/12 0230 01/06/12 0417 01/06/12 0611 01/06/12 0926  BP: 177/97 196/92 178/92 181/86  Pulse: 90 88    Temp:  97 F (36.1 C)    TempSrc:  Oral    Resp: 18 16    Weight:  67.1 kg (147 lb 14.9 oz)    SpO2: 98% 98%      Intake/Output Summary (Last 24 hours) at 01/06/12 0940 Last data filed at 01/06/12 [id number]  Gross per 24 hour  Intake      3 ml  Output      0 ml  Net      3 ml    Exam:   General:  Pt is alert, not in acute distress, non verbal but follows simple commands  Cardiovascular: Regular rate and rhythm, S1/S2, no murmurs, no rubs, no gallops  Respiratory: Clear to auscultation bilaterally, no wheezing, no crackles, no rhonchi  Abdomen: Soft, non tender, non distended, bowel sounds present, no guarding  Extremities: +1 bilateral pitting edema, pulses DP and PT palpable bilaterally  Neuro: Non verbal so not able to assess mental status, pt follows simple commands, moving all 4 extremities against gravity  Data Reviewed: Basic Metabolic Panel:  Lab [id number] 01/05/12 2121  NA -- 135  K --  4.5  CL -- 98  CO2 -- 25  GLUCOSE -- 336*  BUN -- 21  CREATININE 0.96 0.95  CALCIUM -- 11.3*  MG -- --  PHOS -- --   Liver Function Tests:  Lab 01/05/12 2121  AST 26  ALT 18  ALKPHOS 127*  BILITOT 0.4  PROT 8.5*  ALBUMIN 3.1*   CBC:  Lab 01/06/12 0625 01/05/12 2121 01/04/12 1538  WBC 8.3 9.8 6.5  NEUTROABS -- 8.2* 4.4  HGB 14.0 15.9 13.8  HCT 40.9 46.1 42.5  MCV 90.1 89.9 94.1  PLT 235 195 249.0   CBG:  Lab 01/06/12 0616 01/06/12 0207  GLUCAP 207* 278*    Recent Results (from the past 240 hour(s))  MRSA PCR SCREENING     Status: Normal   Collection Time   01/06/12  4:16 AM       Component Value Range Status Comment   MRSA by PCR NEGATIVE  NEGATIVE Final      Scheduled Meds:   . aspirin  81 mg Oral Daily  . calcitRIOL  0.25 mcg Oral Daily  . cyanocobalamin  1,000 mcg Intramuscular Q30 days  . darbepoetin  100 mcg Subcutaneous Q Mon-1800  . donepezil  5 mg Oral QHS  . enoxaparin injection  40 mg Subcutaneous Daily  . insulin aspart  0-15 Units Subcutaneous TID WC  . insulin aspart  0-5 Units Subcutaneous QHS  . insulin glargine  20 Units Subcutaneous q morning - 10a  . isosorbide mononitrate  10 mg Oral BID WC  . pantoprazole  40 mg Oral Q1200  . prednisoLONE acetate  1 drop Both Eyes Daily  . simvastatin  10 mg Oral q1800  . vitamin B-12  1,000 mcg Oral Daily   Continuous Infusions:    Faye Ramsay, MD  [facility] Pager [phone]  If 7PM-7AM, please contact night-coverage [email or web address] Password [email or web address] 01/06/2012, 9:40 AM   LOS: 1 day

## 2012-01-06 NOTE — ED Provider Notes (Signed)
I saw and evaluated the patient, reviewed the resident's note and I agree with the findings and plan.  Barbara Cower, MD 01/06/12 1651

## 2012-01-06 NOTE — H&P (Signed)
[facility] History and Physical  Eric [name] [id number] DOB: [birth year]    PCP:   Renato Shin, MD   Chief Complaint: Altered mental status.   HPI: Eric [name] is an 78 y.o. male with multiple medical problems including FTT, depression, anemia, HTN, hyperlipidemia, DM, anxiety, gastroparesis, dementia, brought to the ER by his son because of intermittent confusion more so than his baseline dementia.  He was at one time considered for comfort measure only goal, but improved, and now back to only DNR/DNI code status.  Evaluation in the ER showed WBC 9.8K, Calcium 11.3, CXR with patchy infiltrate ?atelectasis vs infection.  He also had a head CT without any acute process.  But was found however, was that his BP was 230/110, and he was given several IV labetelol.  Hospitalist was asked to admit him for HTN urgency.  Rewiew of Systems: I was n't able to obtain a meaningful ROS.    Past Medical History  Diagnosis Date  . ANEMIA-NOS 02/17/2007  . DEPRESSION 12/25/2008  . HYPERTENSION 02/17/2007  . GERD 02/17/2007  . OSTEOARTHRITIS 03/04/2010  . DIABETES MELLITUS, TYPE I, CONTROLLED, WITH RETINOPATHY   . Gastroparesis   . BENIGN PROSTATIC HYPERTROPHY   . VITAMIN B12 DEFICIENCY   . BACK PAIN, LUMBAR   . LIVER DISORDER   . PSA, INCREASED   . Hyperlipidemia   . Diabetes mellitus   . Pneumonia   . Anxiety   . Encephalopathy, metabolic   . Altered mental status     Past Surgical History  Procedure Date  . Transurethral resection of prostate     Medications:  HOME MEDS: Prior to Admission medications   Medication Sig Start Date End Date Taking? Authorizing Provider  acetaminophen (TYLENOL) 500 MG tablet Take 1,000 mg by mouth every 6 (six) hours as needed. For pain.    Yes Historical Provider, MD  aspirin 81 MG tablet Take 81 mg by mouth daily.     Yes Historical Provider, MD  calcitRIOL (ROCALTROL) 0.25 MCG capsule Take 0.25 mcg by mouth daily.  12/26/11  Yes  Historical Provider, MD  capsaicin (ZOSTRIX) 0.025 % cream Apply topically 2 (two) times daily. Apply to both knees AVOID contact with eyes and broken or irritated skin. 10/28/11 10/27/12 Yes Modena Jansky, MD  cyanocobalamin (,VITAMIN B-12,) 1000 MCG/ML injection Inject 1,000 mcg into the muscle every 30 (thirty) days.   Yes Historical Provider, MD  darbepoetin (ARANESP) 100 MCG/0.5ML SOLN Inject 0.5 mLs (100 mcg total) into the skin every Monday at 6 PM. 10/28/11  Yes Modena Jansky, MD  donepezil (ARICEPT) 5 MG tablet Take 5 mg by mouth at bedtime.  12/25/11  Yes Historical Provider, MD  insulin glargine (LANTUS) 100 UNIT/ML injection Inject 20 Units into the skin every morning.  10/28/11 10/27/12 Yes Modena Jansky, MD  isosorbide mononitrate (ISMO,MONOKET) 10 MG tablet Take 1 tablet (10 mg total) by mouth 2 (two) times daily. 09/28/11 09/27/12 Yes Renato Shin, MD  levalbuterol Penne Lash) 0.63 MG/3ML nebulizer solution Take 3 mLs (0.63 mg total) by nebulization every 4 (four) hours as needed for wheezing or shortness of breath. 10/28/11 10/27/12 Yes Modena Jansky, MD  nitroGLYCERIN (NITROSTAT) 0.4 MG SL tablet Place 1 tablet (0.4 mg total) under the tongue every 5 (five) minutes as needed for chest pain. 04/24/11 04/23/12 Yes Simbiso Ranga, MD  NON FORMULARY Mighty Shake  1 by mouth twice daily at 10am and 4pm   Yes Historical Provider, MD  omeprazole (PRILOSEC) 20 MG capsule Take 40 mg by mouth daily.   Yes Historical Provider, MD  pravastatin (PRAVACHOL) 80 MG tablet Take 80 mg by mouth daily.   Yes Historical Provider, MD  prednisoLONE acetate (PRED FORTE) 1 % ophthalmic suspension Place 1 drop into both eyes daily. 08/09/11  Yes Historical Provider, MD  triamterene-hydrochlorothiazide (MAXZIDE-25) 37.5-25 MG per tablet Take 1 tablet by mouth daily.    Yes Historical Provider, MD  vitamin B-12 (CYANOCOBALAMIN) 1000 MCG tablet Take 1,000 mcg by mouth daily.   Yes Historical Provider, MD      Allergies:  No Known Allergies  Social History:   reports that he has never smoked. He has never used smokeless tobacco. He reports that he does not drink alcohol or use illicit drugs.  Family History: Family History  Problem Relation Age of Onset  . Cancer Father     had uncertain type of cancer  . Cancer Brother     Prostate Cancer  . Cancer Brother     Prostate Cancer     Physical Exam: Filed Vitals:   01/05/12 2315 01/06/12 0019 01/06/12 0054 01/06/12 0130  BP: 198/105 200/99 187/92 205/97  Pulse: 79 74  75  Temp:      TempSrc:      Resp: 13 18  13   SpO2: 100% 99% 94% 99%   Blood pressure 205/97, pulse 75, temperature 97.8 F (36.6 C), temperature source Oral, resp. rate 13, SpO2 99.00%.  GEN:  Pleasant patient lying in the stretcher in no acute distress; cooperative with exam. PSYCH: Confused ; does not appear anxious or depressed; affect is appropriate. HEENT: Mucous membranes pink and anicteric; PERRLA; EOM intact; no cervical lymphadenopathy nor thyromegaly or carotid bruit; no JVD; There were no stridor. Neck is very supple. Breasts:: Not examined CHEST WALL: No tenderness CHEST: Normal respiration, clear to auscultation bilaterally.  HEART: Regular rate and rhythm.  There are no murmur, rub, or gallops.   BACK: No kyphosis or scoliosis; no CVA tenderness ABDOMEN: soft and non-tender; no masses, no organomegaly, normal abdominal bowel sounds; no pannus; no intertriginous candida. There is no rebound and no distention. Rectal Exam: Not done EXTREMITIES: No bone or joint deformity; age-appropriate arthropathy of the hands and knees; no edema; no ulcerations.  There is no calf tenderness. Genitalia: not examined PULSES: 2+ and symmetric SKIN: Normal hydration no rash or ulceration CNS: He has facial symmetry with fluent speech.  His lower extremities were quite weak  Tongue is midline and uvula elevated with phonation..   Labs on Admission:  Basic  Metabolic Panel:  Lab [id number] 2121  NA 135  K 4.5  CL 98  CO2 25  GLUCOSE 336*  BUN 21  CREATININE 0.95  CALCIUM 11.3*  MG --  PHOS --   Liver Function Tests:  Lab 01/05/12 2121  AST 26  ALT 18  ALKPHOS 127*  BILITOT 0.4  PROT 8.5*  ALBUMIN 3.1*   No results found for this basename: LIPASE:5,AMYLASE:5 in the last 168 hours No results found for this basename: AMMONIA:5 in the last 168 hours CBC:  Lab 01/05/12 2121 01/04/12 1538  WBC 9.8 6.5  NEUTROABS 8.2* 4.4  HGB 15.9 13.8  HCT 46.1 42.5  MCV 89.9 94.1  PLT 195 249.0   Cardiac Enzymes: No results found for this basename: CKTOTAL:5,CKMB:5,CKMBINDEX:5,TROPONINI:5 in the last 168 hours  CBG: No results found for this basename: GLUCAP:5 in the last 168 hours   Radiological Exams on Admission: Ct  Head Wo Contrast  01/06/2012  *RADIOLOGY REPORT*  Clinical Data: Headache.  Anemia.  Depression.  Hyperlipidemia. Diabetes mellitus.  CT HEAD WITHOUT CONTRAST  Technique:  Contiguous axial images were obtained from the base of the skull through the vertex without contrast.  Comparison: 10/17/2011.  Findings: There is no evidence for acute infarction, intracranial hemorrhage, mass lesion, hydrocephalus, or extra-axial fluid. Moderate to severe atrophy is present.  Advanced chronic microvascular ischemic change is present in the periventricular and subcortical white matter.  The calvarium is intact.  Sinuses and mastoids are clear. Left cataract extraction.  Moderate vascular calcification is noted. Little change from priors.  IMPRESSION: Atrophy with chronic microvascular ischemic change.  No acute stroke or bleed.   Original Report Authenticated By: Staci Righter, M.D.    Dg Chest Port 1 View  01/05/2012  *RADIOLOGY REPORT*  Clinical Data: Headache and hypertension.  PORTABLE CHEST - 1 VIEW  Comparison: Plain films of the chest 10/10/2011, 04/29/2011 and 04/19/2011.  Findings: Patchy airspace disease is seen medially in the  right lung base.  Left lung is clear.  Heart size normal.  No pneumothorax or fluid  IMPRESSION: Patchy right basilar airspace disease could be due to atelectasis or infection.   Original Report Authenticated By: Arvid Right. Luther Parody, M.D.     Assessment/Plan Present on Admission:  .Hypertensive encephalopathy .Hyperglycemia .Altered mental status .Bilateral leg edema .CKD (chronic kidney disease) stage 3, GFR 30-59 ml/min .DM (diabetes mellitus), type 1, uncontrolled w/ophthalmic complication .Failure to thrive in adult .Hypernatremia   PLAN:  I think his confusion is because of his dementia.  But he does have quite elevated BP.  His Calcium level is a bit high as well, but not high enough to really cause confusion.  The hypercalcemia could be from the HCTZ.  His BS is also a little bit elevated and he is slightly dehydrated.  I will admit him and use IV Hydralazine to bring his BP down a little.  Will stop the HCTZ and follow Calcium.  For his DM, will continue with his Lantus in the morning, and use SSI.  He is DNR/DNI and we will continue with this code status.  He is stable, and will be admitted to [facility].  Other plans as per orders.  Code Status: DNR/DNI.   Orvan Falconer, MD. [facility] Pager [phone] 7pm to 7am.  01/06/2012, 2:08 AM

## 2012-01-07 LAB — CBC
HCT: 35.1 % — ABNORMAL LOW (ref 39.0–52.0)
Hemoglobin: 11.8 g/dL — ABNORMAL LOW (ref 13.0–17.0)
MCH: 30.6 pg (ref 26.0–34.0)
RBC: 3.86 MIL/uL — ABNORMAL LOW (ref 4.22–5.81)

## 2012-01-07 LAB — BASIC METABOLIC PANEL
Chloride: 105 mEq/L (ref 96–112)
GFR calc Af Amer: 66 mL/min — ABNORMAL LOW (ref >90)
GFR calc non Af Amer: 57 mL/min — ABNORMAL LOW (ref >90)
Glucose, Bld: 104 mg/dL — ABNORMAL HIGH (ref 70–99)
Potassium: 3.2 mEq/L — ABNORMAL LOW (ref 3.5–5.1)
Sodium: 138 mEq/L (ref 135–145)

## 2012-01-07 LAB — GLUCOSE, CAPILLARY: Glucose-Capillary: 57 mg/dL — ABNORMAL LOW (ref 70–99)

## 2012-01-07 MED ORDER — GLUCOSE 40 % PO GEL
ORAL | Status: AC
  Administered 2012-01-07: 37.5 g
  Filled 2012-01-07: qty 1

## 2012-01-07 MED ORDER — POTASSIUM CHLORIDE CRYS ER 20 MEQ PO TBCR
40.0000 meq | EXTENDED_RELEASE_TABLET | Freq: Once | ORAL | Status: AC
  Administered 2012-01-07: 40 meq via ORAL
  Filled 2012-01-07: qty 2

## 2012-01-07 NOTE — Telephone Encounter (Signed)
Called Eric [name] x 3, no answer or VM. Closing phone note until she calls back

## 2012-01-07 NOTE — Progress Notes (Signed)
Hypoglycemic Event  CBG: 57  Treatment: 1 tube instant glucose  Symptoms: None  Follow-up CBG: Time:0702 CBG Result:111  Possible Reasons for Event: Inadequate meal intake  Comments/MD notified:Callahan, NP notified and made aware. Patient has poor po intake and did not receive HS coverage last night or AM coverage this morning for capillary blood glucose. Patient was also given a cup of orange juice and some ensure pudding.    Eric [name]  Remember to initiate Hypoglycemia Order Set & complete

## 2012-01-07 NOTE — Progress Notes (Signed)
Clinical Social Work Department CLINICAL SOCIAL WORK PLACEMENT NOTE 01/07/2012  Patient:  KEMONTA, SEYMOUR  Account Number:  [id number] Admit date:  01/05/2012  Clinical Social Worker:  Lalena Salas Katherine Roan  Date/time:  01/07/2012 04:30 PM  Clinical Social Work is seeking post-discharge placement for this patient at the following level of care:   SKILLED NURSING   (*CSW will update this form in Epic as items are completed)   01/07/2012  Patient/family provided with [place] Department of Clinical Social Work's list of facilities offering this level of care within the geographic area requested by the patient (or if unable, by the patient's family).  01/07/2012  Patient/family informed of their freedom to choose among providers that offer the needed level of care, that participate in Medicare, Medicaid or managed care program needed by the patient, have an available bed and are willing to accept the patient.  01/07/2012  Patient/family informed of [facility]' ownership interest in [facility], as well as of the fact that they are under no obligation to receive care at this facility.  PASARR submitted to EDS on 01/07/2012 PASARR number received from EDS on 01/07/2012  FL2 transmitted to all facilities in geographic area requested by pt/family on  01/07/2012 FL2 transmitted to all facilities within larger geographic area on   Patient informed that his/her managed care company has contracts with or will negotiate with  certain facilities, including the following:     Patient/family informed of bed offers received:   Patient chooses bed at  Physician recommends and patient chooses bed at    Patient to be transferred to  on   Patient to be transferred to facility by   The following physician request were entered in Epic:   Additional Comments:  Rhea Pink, MSW, [place]

## 2012-01-07 NOTE — Progress Notes (Signed)
Inpatient Diabetes Program Recommendations  AACE/ADA: New Consensus Statement on Inpatient Glycemic Control (2013)  Target Ranges:  Prepandial:   less than 140 mg/dL      Peak postprandial:   less than 180 mg/dL (1-2 hours)      Critically ill patients:  140 - 180 mg/dL   Reason for Visit: CBGs less than 70 mg/dl  Inpatient Diabetes Program Recommendations Insulin - Basal: Restart Lantus at 15 units daily. Correction (SSI): Decrease Novolog correction scale to SENSITIVE AC & HS.  Note: Patient has been on insulin for many years.  Unclear what Type of diabetes.  Will probably need some insulin.  Consider smaller dose of Lantus and Novolog correction scale.

## 2012-01-07 NOTE — Progress Notes (Signed)
Occupational Therapy Evaluation Patient Details Name: Eric [name] MRN: [id number] DOB: 1923/06/24 Today's Date: 01/07/2012 Time: 1336-1400 OT Time Calculation (min): 24 min  OT Assessment / Plan / Recommendation Clinical Impression  Pt admitted with AMS and HTN.  Per chart review, pt is from SNF.  Pt with cognitive deficits and unreliable as historian regarding PLOF.  Will benefit from acute OT services to address below problem list in prep for return to SNF.    OT Assessment  Patient needs continued OT Services    Follow Up Recommendations  Supervision/Assistance - 24 hour;Skilled nursing facility    Barriers to Discharge      Equipment Recommendations  Defer to next venue    Recommendations for Other Services    Frequency  Min 2X/week    Precautions / Restrictions     Pertinent Vitals/Pain See vitals    ADL  Upper Body Bathing: Simulated;Minimal assistance Where Assessed - Upper Body Bathing: Unsupported sitting Lower Body Bathing: Simulated;Maximal assistance Where Assessed - Lower Body Bathing: Unsupported sitting Upper Body Dressing: Performed;Minimal assistance Where Assessed - Upper Body Dressing: Unsupported sitting Lower Body Dressing: Simulated;+1 Total assistance Where Assessed - Lower Body Dressing: Unsupported sitting Toilet Transfer: Simulated;+2 Total assistance Toilet Transfer: Patient Percentage: 60% Toilet Transfer Method: Sit to stand Equipment Used: Gait belt;Rolling walker Transfers/Ambulation Related to ADLs: +2 assist for steadying and balance due to left posterior lean and tendency to walk sideways leading with RLE. ADL Comments: +2 assist for functional mobility. When ambulating to chair, pt became fixated on trying to turn quickly and sit before chair was safely placed behind him.  Max verbal and manual assist to not sit prematurely.      OT Diagnosis: Generalized weakness;Cognitive deficits  OT Problem List: Decreased activity  tolerance;Impaired balance (sitting and/or standing);Decreased cognition;Decreased knowledge of use of DME or AE OT Treatment Interventions: Self-care/ADL training;DME and/or AE instruction;Therapeutic activities;Patient/family education;Balance training;Cognitive remediation/compensation   OT Goals Acute Rehab OT Goals OT Goal Formulation: With patient Time For Goal Achievement: 01/14/12 Potential to Achieve Goals: Good ADL Goals Pt Will Perform Grooming: with supervision;Sitting at sink;Standing at sink ADL Goal: Grooming - Progress: Goal set today Pt Will Transfer to Toilet: with mod assist;Ambulation;with DME;3-in-1 ADL Goal: Toilet Transfer - Progress: Goal set today Miscellaneous OT Goals Miscellaneous OT Goal #1: Pt will perform bed mobility with supervision in prep for EOB ADLs. OT Goal: Miscellaneous Goal #1 - Progress: Goal set today  Visit Information  Last OT Received On: 01/07/12 Assistance Needed: +2    Subjective Data      Prior Functioning  Vision/Perception  Home Living Lives With:  (SNF per chart) Available Help at Discharge: [place] Prior Function Comments: Pt is from SNF per chart.  Pt unreliable historian of PLOF due to cognitive deficits. Communication Communication: No difficulties Dominant Hand: Right      Cognition  Overall Cognitive Status: No family/caregiver present to determine baseline cognitive functioning Orientation Level: Disoriented to;Place;Situation;Time Behavior During Session: [facility] for tasks performed    Extremity/Trunk Assessment Right Upper Extremity Assessment RUE ROM/Strength/Tone: [facility] for tasks assessed Left Upper Extremity Assessment LUE ROM/Strength/Tone: [facility] for tasks assessed   Mobility  Shoulder Instructions  Bed Mobility Bed Mobility: Supine to Sit;Sitting - Scoot to Edge of Bed Supine to Sit: 3: Mod assist;With rails Sitting - Scoot to Edge of Bed: 3: Mod assist Details for Bed Mobility Assistance:  Increased time and cues to complete task Transfers Transfers: Sit to Stand;Stand to Sit Sit to  Stand: 1: +2 Total assist;From bed;With upper extremity assist Sit to Stand: Patient Percentage: 60% Stand to Sit: 1: +2 Total assist;To chair/3-in-1;With armrests;With upper extremity assist Stand to Sit: Patient Percentage: 60% Details for Transfer Assistance: Assist for steadying and balance.  Hand over hand cueing for safe hand placement.  Upon standing, required max cueing to obtain full upright posture due to pt with hips flexed.        Exercise     Balance     End of Session OT - End of Session Equipment Utilized During Treatment: Gait belt Activity Tolerance: Patient tolerated treatment well Patient left: in chair;with call bell/phone within reach Nurse Communication: Mobility status  GO    01/07/2012 Eric [name] OTR/L Pager [phone] Office [phone]  Eric [name], Eric [name] 01/07/2012, 4:05 PM

## 2012-01-07 NOTE — Evaluation (Signed)
Physical Therapy Evaluation Patient Details Name: Eric [name] MRN: [id number] DOB: 1924-04-19 Today's Date: 01/07/2012 Time: 1336-1400 PT Time Calculation (min): 24 min  PT Assessment / Plan / Recommendation Clinical Impression  Patient is an 78 yo male admitted with HTN and encephalopathy.  Patient with history of dementia and resident of SNF.  Patient requiring +2 assist for mobility.  Recommend patient be discharged back to SNF for continued therapy.  Will follow acutely to maximize independence with mobility.    PT Assessment  Patient needs continued PT services    Follow Up Recommendations  Skilled nursing facility    Barriers to Discharge Decreased caregiver support      Equipment Recommendations  Defer to next venue    Recommendations for Other Services     Frequency Min 3X/week    Precautions / Restrictions Precautions Precautions: Fall Restrictions Weight Bearing Restrictions: No         Mobility  Bed Mobility Bed Mobility: Supine to Sit;Sitting - Scoot to Edge of Bed Supine to Sit: 3: Mod assist;With rails Sitting - Scoot to Edge of Bed: 3: Mod assist Details for Bed Mobility Assistance: Increased time and cues to complete task Transfers Transfers: Sit to Stand;Stand to Sit Sit to Stand: 1: +2 Total assist;From bed;With upper extremity assist Sit to Stand: Patient Percentage: 60% Stand to Sit: 1: +2 Total assist;To chair/3-in-1;With armrests;With upper extremity assist Stand to Sit: Patient Percentage: 60% Details for Transfer Assistance: Verbal cues for safe hand placement and to scoot to EOB before attempting to stand.  Cues to stand upright, extending hips and knees.  Patient with difficulty with stand to sit - once he saw chair, he began to sit.  Verbal and tactile cues to remain upright and back up to chair before sitting.  Ambulation/Gait Ambulation/Gait Assistance: 1: +2 Total assist Ambulation/Gait: Patient Percentage: 60% Ambulation Distance  (Feet): 15 Feet Assistive device: Rolling walker Ambulation/Gait Assistance Details: Verbal cues to stand upright with gait.  Patient leaning significantly to left and posterior.  Trunk rotated to left, making patient walk keeping right foot in front of left.  Required +2 assist for safety, and repeated cuing for technique. Gait Pattern: Step-to pattern;Decreased step length - left;Right flexed knee in stance;Left flexed knee in stance;Shuffle;Lateral trunk lean to left;Trunk rotated posteriorly on left;Trunk flexed Gait velocity: Slow gait speed    Exercises     PT Diagnosis: Difficulty walking;Generalized weakness;Altered mental status  PT Problem List: Decreased strength;Decreased activity tolerance;Decreased balance;Decreased mobility;Decreased cognition;Decreased knowledge of use of DME;Decreased safety awareness PT Treatment Interventions: DME instruction;Gait training;Functional mobility training;Balance training;Patient/family education   PT Goals Acute Rehab PT Goals PT Goal Formulation: With patient Time For Goal Achievement: 01/21/12 Potential to Achieve Goals: Fair Pt will go Supine/Side to Sit: with supervision;with HOB not 0 degrees (comment degree) PT Goal: Supine/Side to Sit - Progress: Goal set today Pt will go Sit to Supine/Side: with supervision;with HOB not 0 degrees (comment degree) PT Goal: Sit to Supine/Side - Progress: Goal set today Pt will go Sit to Stand: with upper extremity assist;with mod assist PT Goal: Sit to Stand - Progress: Goal set today Pt will go Stand to Sit: with mod assist;with upper extremity assist PT Goal: Stand to Sit - Progress: Goal set today Pt will Transfer Bed to Chair/Chair to Bed: with mod assist PT Transfer Goal: Bed to Chair/Chair to Bed - Progress: Goal set today Pt will Ambulate: 16 - 50 feet;with mod assist;with rolling walker PT Goal: Ambulate - Progress:  Goal set today  Visit Information  Last PT Received On:  01/07/12 Assistance Needed: +2 PT/OT Co-Evaluation/Treatment: Yes    Subjective Data  Subjective: Pleasantly confused.   Patient Stated Goal: Unable to state   Prior [place] Lives With: Other (Comment) (SNF per chart) Available Help at Discharge: [place] Prior Function Level of Independence: Needs assistance Comments: Patient is from SNF.  Dementia - unreliable historian of PLOF. Communication Communication: No difficulties Dominant Hand: Right    Cognition  Overall Cognitive Status: No family/caregiver present to determine baseline cognitive functioning Arousal/Alertness: Awake/alert Orientation Level: Disoriented to;Place;Situation;Time Behavior During Session: [facility] for tasks performed    Extremity/Trunk Assessment Right Upper Extremity Assessment RUE ROM/Strength/Tone: [facility] for tasks assessed Left Upper Extremity Assessment LUE ROM/Strength/Tone: WFL for tasks assessed Right Lower Extremity Assessment RLE ROM/Strength/Tone: Deficits RLE ROM/Strength/Tone Deficits: general weakness 4-/5 Left Lower Extremity Assessment LLE ROM/Strength/Tone: Deficits LLE ROM/Strength/Tone Deficits: General weakness 4-/5   Balance    End of Session PT - End of Session Equipment Utilized During Treatment: Gait belt Activity Tolerance: Patient limited by fatigue;Treatment limited secondary to medical complications (Comment) (Limited by cognition) Patient left: in chair;with call bell/phone within reach Nurse Communication: Mobility status  GP     Despina Pole 01/07/2012, 6:19 PM Carita Pian. Sanjuana Kava, [place] Pager [phone]

## 2012-01-07 NOTE — Progress Notes (Signed)
Clinical Social Work Department BRIEF PSYCHOSOCIAL ASSESSMENT 01/07/2012  Patient:  Eric [name], Eric [name]     Account Number:  [id number]     Admit date:  01/05/2012  Clinical Social Worker:  Lillette Boxer  Date/Time:  01/07/2012 04:18 PM  Referred by:  Physician  Date Referred:  01/07/2012 Referred for  SNF Placement  Advanced Directives   Other Referral:   Patient from ALF   Interview type:  Patient Other interview type:   Son was at [facility] Status:  FACILITY Admitted from facility:  [facility] Level of care:  Assisted Living Primary support name:  Eric [name] Primary support relationship to patient:  CHILD, ADULT Degree of support available:   [place] and vested    CURRENT CONCERNS Current Concerns  Post-Acute Placement   Other Concerns:    SOCIAL WORK ASSESSMENT / PLAN Clinical Social Worker received referral for the potential need for post acute placement at SNF level.  Patient' son also requested to speak to CSW about AD. CSW reviewed chart and met with patient and patient's son at bedside. CSW introduced self, explained role, and provided emotional support. CSW discussed  AD packet with patient and patient's son. Patient was able to answer questions directly and acknowledged his understanding of the conversation. Patient is going to look over the packet and talk to his family about the AD  paperwork. CSW also discussed skilled nursing home placement. Patient's son feels that his father would benefit from PT to help him get stronger. Patient reported that he has been to [facility] in the past and would like to return.   Patient reported that he is agreeable to CSW seeking placement. CSW encouraged patient to ask questions as needed of CSW and medical staff. CSW will begin the placement process and will continue to follow and assist with all d/c planning.   Assessment/plan status:  Psychosocial Support/Ongoing  Assessment of Needs Other assessment/ plan:   Information/referral to community resources:   HPOA paperwork    PATIENT'S/FAMILY'S RESPONSE TO PLAN OF CARE: Patient and patient's son were very appreciative of support and infomation provided by CSW. CSW will continue to follow and assist with d/c planning.    Rhea Pink, MSW, [place]

## 2012-01-07 NOTE — Progress Notes (Signed)
Patient ID: Eric [name], male   DOB: 1924/01/13, 78 y.o.   MRN: [id number]  [facility] PROGRESS NOTE  Eric [name] [id number] DOB: 1923/11/30 DOA: 01/05/2012 PCP: Renato Shin, MD  Brief narrative:  Pt is 78 yo male who was admitted 09/18 --> with change in mental status and of unclear etiology as pt was not able to provide history. In ED, BP found to be in 240's/140's   Principal Problem:  *Hypertensive encephalopathy  - no significant change in mental status and this could be possibly related to ongoing and progressive dementia and failure to thrive  - BP is better this AM but not at goal yet  - will place on scheduled Hydralazine and will continue Isosorbide  - monitor vitals per floor protocol   Active Problems:  Altered mental status  - unclear etiology at this time but likely secondary to progressive failure to thrive and progressive, advancing dementia  - unclear if pneumonia on CXR representing an acute infection  - will treat with empiric ABX   ? CAP  - based on imaging and unclear based on symptoms as pt not able to provide history  - will treat with empiric antibiotics   Failure to thrive in adult  - nutrition consult   DM (diabetes mellitus), type 1, uncontrolled w/ophthalmic complication  - d/c Lantus due to hypoglycemia  CKD (chronic kidney disease) stage 3, GFR 30-59 ml/min  - based on GFR but creatinine is within normal limits  - BMP in AM   Hypernatremia  - will check BMET in AM - encourage PO intake   Bilateral leg edema  - improving   Consultants:  None  Procedures/Studies:  Ct Head Wo Contrast  01/06/2012  IMPRESSION:  Atrophy with chronic microvascular ischemic change. No acute stroke or bleed.   Dg Chest Port 1 View  01/05/2012  IMPRESSION:  Patchy right basilar airspace disease could be due to atelectasis or infection.   Antibiotics:  Zithromax 09/19 -->  Rocephin 09/19 -->  Code Status: DNR  Family Communication: Son  at bedside  Disposition Plan: SNF     HPI/Subjective: No events overnight.   Objective: Filed Vitals:   01/07/12 0617 01/07/12 0922 01/07/12 1300 01/07/12 1553  BP: 154/67 166/76 158/70 172/83  Pulse: 89 78 80 79  Temp: 99.8 F (37.7 C)  98.6 F (37 C)   TempSrc: Oral  Oral   Resp: 20 18 18 18   Height: 5\' 10"  (1.778 m)     Weight: 71.2 kg (156 lb 15.5 oz)     SpO2: 97% 99% 99% 100%    Intake/Output Summary (Last 24 hours) at 01/07/12 1721 Last data filed at 01/07/12 1300  Gross per 24 hour  Intake    843 ml  Output    550 ml  Net    293 ml    Exam:   General:  Pt is alert, follows commands appropriately, not in acute distress  Cardiovascular: Regular rate and rhythm, S1/S2, no murmurs, no rubs, no gallops  Respiratory: Clear to auscultation bilaterally, no wheezing, no crackles, no rhonchi  Abdomen: Soft, non tender, non distended, bowel sounds present, no guarding  Extremities: No edema, pulses DP and PT palpable bilaterally  Data Reviewed: Basic Metabolic Panel:  Lab [id number] 01/06/12 1130 01/06/12 0625 01/05/12 2121  NA 138 132* -- 135  K 3.2* 3.8 -- 4.5  CL 105 101 -- 98  CO2 25 23 -- 25  GLUCOSE 104* 250* -- 336*  BUN 14 19 -- 21  CREATININE 1.14 0.96 0.96 0.95  CALCIUM 9.4 9.8 -- 11.3*  MG -- -- -- --  PHOS -- -- -- --   Liver Function Tests:  Lab 01/05/12 2121  AST 26  ALT 18  ALKPHOS 127*  BILITOT 0.4  PROT 8.5*  ALBUMIN 3.1*   CBC:  Lab 01/07/12 0648 01/06/12 0625 01/05/12 2121 01/04/12 1538  WBC 6.2 8.3 9.8 6.5  NEUTROABS -- -- 8.2* 4.4  HGB 11.8* 14.0 15.9 13.8  HCT 35.1* 40.9 46.1 42.5  MCV 90.9 90.1 89.9 94.1  PLT 181 235 195 249.0   CBG:  Lab 01/07/12 1629 01/07/12 1039 01/07/12 0702 01/07/12 0624 01/06/12 2104  GLUCAP 157* 190* 111* 57* 167*    Recent Results (from the past 240 hour(s))  URINE CULTURE     Status: Normal (Preliminary result)   Collection Time   01/05/12 11:03 PM      Component Value Range  Status Comment   Specimen Description URINE, RANDOM   Final    Special Requests NONE   Final    Culture  Setup Time 01/06/2012 00:35   Final    Colony Count PENDING   Incomplete    Culture Culture reincubated for better growth   Final    Report Status PENDING   Incomplete   MRSA PCR SCREENING     Status: Normal   Collection Time   01/06/12  4:16 AM      Component Value Range Status Comment   MRSA by PCR NEGATIVE  NEGATIVE Final      Scheduled Meds:   . sodium chloride   Intravenous STAT  . aspirin  81 mg Oral Daily  . azithromycin  500 mg Intravenous Daily  . calcitRIOL  0.25 mcg Oral Daily  . cefTRIAXone (ROCEPHIN)  IV  1 g Intravenous Q24H  . cyanocobalamin  1,000 mcg Intramuscular Q30 days  . darbepoetin  100 mcg Subcutaneous Q Mon-1800  . dextrose      . donepezil  5 mg Oral QHS  . enoxaparin (LOVENOX) injection  40 mg Subcutaneous Daily  . feeding supplement  1 Container Oral TID BM  . hydrALAZINE  25 mg Oral Q8H  . isosorbide mononitrate  10 mg Oral BID WC  . multivitamin with minerals  1 tablet Oral Daily  . pantoprazole  40 mg Oral Q1200  . potassium chloride  40 mEq Oral Once  . prednisoLONE acetate  1 drop Both Eyes Daily  . simvastatin  10 mg Oral q1800  . sodium chloride  3 mL Intravenous Q12H  . sodium chloride  3 mL Intravenous Q12H  . vitamin B-12  1,000 mcg Oral Daily  . DISCONTD: insulin aspart  0-15 Units Subcutaneous TID WC  . DISCONTD: insulin aspart  0-5 Units Subcutaneous QHS  . DISCONTD: insulin glargine  20 Units Subcutaneous q morning - 10a   Continuous Infusions:    Faye Ramsay, MD  [facility] Pager [phone]  If 7PM-7AM, please contact night-coverage [email or web address] Password [email or web address] 01/07/2012, 5:21 PM   LOS: 2 days

## 2012-01-08 ENCOUNTER — Inpatient Hospital Stay (HOSPITAL_COMMUNITY): Payer: Medicare Other

## 2012-01-08 LAB — CBC
HCT: 40.3 % (ref 39.0–52.0)
MCV: 91.2 fL (ref 78.0–100.0)
RBC: 4.42 MIL/uL (ref 4.22–5.81)
WBC: 6.6 10*3/uL (ref 4.0–10.5)

## 2012-01-08 LAB — BASIC METABOLIC PANEL
BUN: 18 mg/dL (ref 6–23)
CO2: 25 mEq/L (ref 19–32)
Chloride: 101 mEq/L (ref 96–112)
Creatinine, Ser: 1.02 mg/dL (ref 0.50–1.35)
Glucose, Bld: 211 mg/dL — ABNORMAL HIGH (ref 70–99)

## 2012-01-08 LAB — GLUCOSE, CAPILLARY
Glucose-Capillary: 191 mg/dL — ABNORMAL HIGH (ref 70–99)
Glucose-Capillary: 342 mg/dL — ABNORMAL HIGH (ref 70–99)
Glucose-Capillary: 374 mg/dL — ABNORMAL HIGH (ref 70–99)
Glucose-Capillary: 228 mg/dL — ABNORMAL HIGH (ref 70–99)

## 2012-01-08 MED ORDER — INSULIN GLARGINE 100 UNIT/ML ~~LOC~~ SOLN
10.0000 [IU] | Freq: Every day | SUBCUTANEOUS | Status: DC
  Administered 2012-01-08: 10 [IU] via SUBCUTANEOUS

## 2012-01-08 MED ORDER — DIPHENHYDRAMINE HCL 25 MG PO CAPS
25.0000 mg | ORAL_CAPSULE | Freq: Three times a day (TID) | ORAL | Status: AC
  Administered 2012-01-08 – 2012-01-09 (×4): 25 mg via ORAL
  Filled 2012-01-08 (×4): qty 1

## 2012-01-08 MED ORDER — INSULIN ASPART 100 UNIT/ML ~~LOC~~ SOLN
0.0000 [IU] | Freq: Three times a day (TID) | SUBCUTANEOUS | Status: DC
  Administered 2012-01-08: 9 [IU] via SUBCUTANEOUS
  Administered 2012-01-09 (×2): 7 [IU] via SUBCUTANEOUS
  Administered 2012-01-09: 2 [IU] via SUBCUTANEOUS
  Administered 2012-01-10: 7 [IU] via SUBCUTANEOUS
  Administered 2012-01-10: 3 [IU] via SUBCUTANEOUS

## 2012-01-08 MED ORDER — CLONIDINE HCL 0.1 MG PO TABS
0.1000 mg | ORAL_TABLET | Freq: Once | ORAL | Status: AC
  Administered 2012-01-08: 0.1 mg via ORAL
  Filled 2012-01-08: qty 1

## 2012-01-08 MED ORDER — AZITHROMYCIN 250 MG PO TABS
250.0000 mg | ORAL_TABLET | Freq: Every day | ORAL | Status: DC
  Administered 2012-01-09 – 2012-01-10 (×2): 250 mg via ORAL
  Filled 2012-01-08 (×2): qty 1

## 2012-01-08 MED ORDER — TRAMADOL HCL 50 MG PO TABS
50.0000 mg | ORAL_TABLET | Freq: Four times a day (QID) | ORAL | Status: DC | PRN
  Filled 2012-01-08: qty 1

## 2012-01-08 MED ORDER — ISOSORBIDE MONONITRATE 20 MG PO TABS
20.0000 mg | ORAL_TABLET | Freq: Two times a day (BID) | ORAL | Status: DC
  Administered 2012-01-08 – 2012-01-10 (×4): 20 mg via ORAL
  Filled 2012-01-08 (×6): qty 1

## 2012-01-08 MED ORDER — OXYCODONE-ACETAMINOPHEN 5-325 MG PO TABS
1.0000 | ORAL_TABLET | ORAL | Status: DC | PRN
  Administered 2012-01-08: 1 via ORAL
  Filled 2012-01-08: qty 1

## 2012-01-08 MED ORDER — FAMOTIDINE 20 MG PO TABS
20.0000 mg | ORAL_TABLET | Freq: Two times a day (BID) | ORAL | Status: AC
  Administered 2012-01-08 – 2012-01-09 (×2): 20 mg via ORAL
  Filled 2012-01-08 (×2): qty 1

## 2012-01-08 NOTE — Progress Notes (Signed)
[facility] progress note. Chief complaint. New rash. History of present illness. This 78 year old male in hospital with hypertensive encephalopathy,? Pneumonia, failure to thrive, diabetes, stage III kidney disease, etc. That he was noted by the nursing staff to have a new rash. Nursing has informed me that the patient was started today on Percocet for pain management. He has a listed no drug allergies. I came to the bedside to further evaluate the patient. Vital signs temperature 90.8 pulse 95, respiration 18, blood pressure 194/83. O2 sats 98%. General appearance. Frail elderly male in no distress. Alert and cooperative with exam. HEENT. No facial swelling, tongue swelling or evidence of angioedema. Cardiac. Rate and rhythm regular. Occasional irregular beat. Lungs. Breath sounds are clear. Stable O2 sats. Abdomen. Soft with positive bowel sounds. No pain. Skin. Patient has a maculopapular rash and diffuse erythema over most of his back, axilla bilaterally, and on his bottom. Impression/plan. Problem #1. New maculopapular rash. Suspect this is a drug rash probably due to new medication Percocet. I stopped Percocet and listed this is any allergy. I will change his pain management to Ultram 50 mg every 6 hours as needed. I will provide him Benadryl 25 mg 3 times daily for 3 doses and Pepcid 20 mg daily for 2 doses to help with his symptoms. Problem #2. Uncontrolled hypertension. Patient status post recent administration of hydralazine though this does not seem to have improved his blood pressure significantly. I will order him a one-time dose of clonidine 0.1 mg by mouth. Nursing will notify me of any further blood pressure issues.

## 2012-01-08 NOTE — Progress Notes (Signed)
@   1935, Pt's BP=217/90 on dinamap; rechecked manually it was 205/90, pt resting, denies any pain at this time. Hydralazine 10mg  IV given, pt's BP down to 185/94. [name] was notified and ordered clonidine 0.1mg  PO. Will continue to monitor.  Eric [name]

## 2012-01-08 NOTE — Progress Notes (Signed)
Patient ID: Eric [name], male   DOB: [birth year], 78 y.o.   MRN: [id number]  [facility] PROGRESS NOTE  Eric [name] [id number] DOB: Jun 01, 1923 DOA: 01/05/2012 PCP: Renato Shin, MD  Brief narrative:  Pt is 78 yo male who was admitted 09/18 --> with change in mental status and of unclear etiology as pt was not able to provide history. In ED, BP found to be in 240's/140's   Principal Problem:  *Hypertensive encephalopathy  - improved mental status and pt more alert, following commands - BP is better this AM but not at goal yet  - increase the dose of Isosorbide - monitor vitals per floor protocol   Active Problems:  Altered mental status  - unclear etiology at this time but likely secondary to progressive failure to thrive and progressive, advancing dementia  - unclear if pneumonia on CXR representing an acute infection  - will treat with empiric ABX but will narrow down to Zithromax only  ? CAP  - based on imaging and unclear based on symptoms as pt not able to provide history  - will treat with empiric antibiotics as noted above  Failure to thrive in adult  - nutrition consult   DM (diabetes mellitus), type 1, uncontrolled w/ophthalmic complication  - restart low dose Lantus and SSI  CKD (chronic kidney disease) stage 3, GFR 30-59 ml/min  - based on GFR but creatinine is within normal limits  - BMP in AM   Hypernatremia  - will check BMET in AM  - encouraged PO intake   Bilateral leg edema  - improving   Consultants:  None  Procedures/Studies:  Ct Head Wo Contrast  01/06/2012  IMPRESSION:  Atrophy with chronic microvascular ischemic change. No acute stroke or bleed.   Dg Chest Port 1 View  01/05/2012  IMPRESSION:  Patchy right basilar airspace disease could be due to atelectasis or infection.   Antibiotics:  Zithromax 09/19 -->  Rocephin 09/19 --> 09/21  Code Status: DNR  Family Communication: Son at bedside  Disposition Plan: SNF    HPI/Subjective: No events overnight.   Objective: Filed Vitals:   01/07/12 2147 01/08/12 0440 01/08/12 0441 01/08/12 0617  BP: 178/90 207/97 169/100 165/88  Pulse:  81  78  Temp:  98.6 F (37 C)    TempSrc:  Oral    Resp: 18 19    Height:      Weight:  71.1 kg (156 lb 12 oz)    SpO2:  100%      Intake/Output Summary (Last 24 hours) at 01/08/12 1136 Last data filed at 01/08/12 1117  Gross per 24 hour  Intake    960 ml  Output   1750 ml  Net   -790 ml    Exam:   General:  Pt is alert, follows commands appropriately, not in acute distress  Cardiovascular: Regular rate and rhythm, S1/S2, no murmurs, no rubs, no gallops  Respiratory: Clear to auscultation bilaterally, no wheezing, no crackles, no rhonchi  Abdomen: Soft, non tender, non distended, bowel sounds present, no guarding  Extremities: No edema, pulses DP and PT palpable bilaterally  Data Reviewed: Basic Metabolic Panel:  Lab [id number] 01/07/12 0648 01/06/12 1130 01/06/12 0625 01/05/12 2121  NA 135 138 132* -- 135  K 3.9 3.2* 3.8 -- 4.5  CL 101 105 101 -- 98  CO2 25 25 23  -- 25  GLUCOSE 211* 104* 250* -- 336*  BUN 18 14 19  -- 21  CREATININE 1.02 1.14  0.96 0.96 0.95  CALCIUM 9.8 9.4 9.8 -- 11.3*  MG -- -- -- -- --  PHOS -- -- -- -- --   Liver Function Tests:  Lab 01/05/12 2121  AST 26  ALT 18  ALKPHOS 127*  BILITOT 0.4  PROT 8.5*  ALBUMIN 3.1*   CBC:  Lab 01/08/12 0510 01/07/12 0648 01/06/12 0625 01/05/12 2121 01/04/12 1538  WBC 6.6 6.2 8.3 9.8 6.5  NEUTROABS -- -- -- 8.2* 4.4  HGB 13.4 11.8* 14.0 15.9 13.8  HCT 40.3 35.1* 40.9 46.1 42.5  MCV 91.2 90.9 90.1 89.9 94.1  PLT 199 181 235 195 249.0   CBG:  Lab 01/08/12 0608 01/07/12 2212 01/07/12 1629 01/07/12 1039 01/07/12 0702  GLUCAP 191* 257* 157* 190* 111*    Recent Results (from the past 240 hour(s))  URINE CULTURE     Status: Normal (Preliminary result)   Collection Time   01/05/12 11:03 PM      Component Value Range Status  Comment   Specimen Description URINE, RANDOM   Final    Special Requests NONE   Final    Culture  Setup Time 01/06/2012 00:35   Final    Colony Count 50,000 COLONIES/ML   Final    Culture     Final    Value: Multiple bacterial morphotypes present, none predominant. Suggest appropriate recollection if clinically indicated.   Report Status PENDING   Incomplete   MRSA PCR SCREENING     Status: Normal   Collection Time   01/06/12  4:16 AM      Component Value Range Status Comment   MRSA by PCR NEGATIVE  NEGATIVE Final      Scheduled Meds:   . aspirin  81 mg Oral Daily  . azithromycin  500 mg Intravenous Daily  . calcitRIOL  0.25 mcg Oral Daily  . cefTRIAXone (ROCEPHIN)  IV  1 g Intravenous Q24H  . cyanocobalamin  1,000 mcg Intramuscular Q30 days  . darbepoetin  100 mcg Subcutaneous Q Mon-1800  . donepezil  5 mg Oral QHS  . enoxaparin (LOVENOX) injection  40 mg Subcutaneous Daily  . feeding supplement  1 Container Oral TID BM  . hydrALAZINE  25 mg Oral Q8H  . isosorbide mononitrate  20 mg Oral BID WC  . multivitamin with minerals  1 tablet Oral Daily  . pantoprazole  40 mg Oral Q1200  . prednisoLONE acetate  1 drop Both Eyes Daily  . simvastatin  10 mg Oral q1800  . sodium chloride  3 mL Intravenous Q12H  . sodium chloride  3 mL Intravenous Q12H  . vitamin B-12  1,000 mcg Oral Daily  . DISCONTD: isosorbide mononitrate  10 mg Oral BID WC   Continuous Infusions:    Faye Ramsay, MD  [facility] Pager [phone]  If 7PM-7AM, please contact night-coverage [email or web address] Password [facility] 01/08/2012, 11:36 AM   LOS: 3 days

## 2012-01-08 NOTE — Progress Notes (Signed)
Pt noticed to have rashes on his entire back and c/o itchiness, pt took percocet 1 tab at 1854.[name] notified and came to check the patient and ordered benadryl 25mg  po TID and pepcid 20mg  po BID, ultram 50mg  prn every 6hrs for pain. Percocet was discontinued per order. Will continue to monitor.  Doree Albee [place]

## 2012-01-09 LAB — BASIC METABOLIC PANEL
BUN: 21 mg/dL (ref 6–23)
Chloride: 97 mEq/L (ref 96–112)
GFR calc Af Amer: 72 mL/min — ABNORMAL LOW (ref >90)
GFR calc non Af Amer: 62 mL/min — ABNORMAL LOW (ref >90)
Potassium: 3.8 mEq/L (ref 3.5–5.1)
Sodium: 130 mEq/L — ABNORMAL LOW (ref 135–145)

## 2012-01-09 LAB — CBC
HCT: 41 % (ref 39.0–52.0)
MCHC: 34.1 g/dL (ref 30.0–36.0)
Platelets: 184 10*3/uL (ref 150–400)
RDW: 14.7 % (ref 11.5–15.5)
WBC: 7.1 10*3/uL (ref 4.0–10.5)

## 2012-01-09 LAB — GLUCOSE, CAPILLARY: Glucose-Capillary: 181 mg/dL — ABNORMAL HIGH (ref 70–99)

## 2012-01-09 MED ORDER — INSULIN GLARGINE 100 UNIT/ML ~~LOC~~ SOLN
15.0000 [IU] | Freq: Every day | SUBCUTANEOUS | Status: DC
  Administered 2012-01-09: 15 [IU] via SUBCUTANEOUS

## 2012-01-09 MED ORDER — CLONIDINE HCL 0.1 MG PO TABS
0.1000 mg | ORAL_TABLET | Freq: Two times a day (BID) | ORAL | Status: DC
  Administered 2012-01-09 – 2012-01-10 (×3): 0.1 mg via ORAL
  Filled 2012-01-09 (×4): qty 1

## 2012-01-09 NOTE — Progress Notes (Signed)
Faye Ramsay, MD  [facility] Pager [phone]  If 7PM-7AM, please contact night-coverage [email or web address] Password [email or web address]

## 2012-01-09 NOTE — Progress Notes (Signed)
Patient ID: Eric [name], male   DOB: 1924/03/16, 78 y.o.   MRN: [id number]  [facility] PROGRESS NOTE  Eric [name] [id number] DOB: [birth year] DOA: 01/05/2012 PCP: Renato Shin, MD  Brief narrative:  Pt is 78 yo male who was admitted 09/18 --> with change in mental status and of unclear etiology as pt was not able to provide history. In ED, BP found to be in 240's/140's   Principal Problem:  *Hypertensive encephalopathy  - improved mental status and pt more alert, following commands  - BP is better this AM but not at goal still - will add Clonidine to the regimen today - monitor vitals per floor protocol   Active Problems:  Altered mental status  - unclear etiology at this time but likely secondary to progressive failure to thrive and progressive, advancing dementia  - unclear if pneumonia on CXR representing an acute infection  - continue Zithromax for now  ? CAP  - based on imaging and unclear based on symptoms as pt not able to provide history  - will treat with empiric antibiotics as noted above   Failure to thrive in adult  - nutrition consult   DM (diabetes mellitus), type 1, uncontrolled w/ophthalmic complication  - increase Lantus today Lantus and continue SSI   CKD (chronic kidney disease) stage 3, GFR 30-59 ml/min  - based on GFR but creatinine is within normal limits  - BMP in AM   Hyponatremia  - will check BMET in AM  - encouraged PO intake   Bilateral leg edema  - improving, CXR right lower extremity negative for acute findings  Consultants:  None  Procedures/Studies:  Ct Head Wo Contrast  01/06/2012  IMPRESSION:  Atrophy with chronic microvascular ischemic change. No acute stroke or bleed.   Dg Chest Port 1 View  01/05/2012  IMPRESSION:  Patchy right basilar airspace disease could be due to atelectasis or infection.   Dg Tibia/fibula Right 01/08/2012   IMPRESSION:  Chondrocalcinosis type changes involving the knee  joint.  Antibiotics:  Zithromax 09/19 -->  Rocephin 09/19 --> 09/21  Code Status: DNR  Family Communication: PT at bedside Disposition Plan: SNF Monday   HPI/Subjective: No events overnight.   Objective: Filed Vitals:   01/09/12 0005 01/09/12 0504 01/09/12 0635 01/09/12 0723  BP: 174/94 191/86 165/79 136/61  Pulse: 84 84 88 78  Temp:  97.5 F (36.4 C)    TempSrc:  Oral    Resp:  18 18   Height:      Weight:  69.9 kg (154 lb 1.6 oz)    SpO2:  100%      Intake/Output Summary (Last 24 hours) at 01/09/12 1041 Last data filed at 01/09/12 0839  Gross per 24 hour  Intake    840 ml  Output   1825 ml  Net   -985 ml    Exam:   General:  Pt is alert, follows commands appropriately, not in acute distress  Cardiovascular: Regular rate and rhythm, S1/S2, no murmurs, no rubs, no gallops  Respiratory: Clear to auscultation bilaterally, no wheezing, no crackles, no rhonchi  Abdomen: Soft, non tender, non distended, bowel sounds present, no guarding  Extremities: No edema, pulses DP and PT palpable bilaterally  Neuro: Grossly nonfocal  Data Reviewed: Basic Metabolic Panel:  Lab [id number] 01/08/12 0510 01/07/12 0648 01/06/12 1130 01/06/12 0625 01/05/12 2121  NA 130* 135 138 132* -- 135  K 3.8 3.9 3.2* 3.8 -- 4.5  CL 97 101  105 101 -- 98  CO2 22 25 25 23  -- 25  GLUCOSE 215* 211* 104* 250* -- 336*  BUN 21 18 14 19  -- 21  CREATININE 1.06 1.02 1.14 0.96 0.96 --  CALCIUM 9.9 9.8 9.4 9.8 -- 11.3*  MG -- -- -- -- -- --  PHOS -- -- -- -- -- --   Liver Function Tests:  Lab 01/05/12 2121  AST 26  ALT 18  ALKPHOS 127*  BILITOT 0.4  PROT 8.5*  ALBUMIN 3.1*   CBC:  Lab 01/09/12 0416 01/08/12 0510 01/07/12 0648 01/06/12 0625 01/05/12 2121 01/04/12 1538  WBC 7.1 6.6 6.2 8.3 9.8 --  NEUTROABS -- -- -- -- 8.2* 4.4  HGB 14.0 13.4 11.8* 14.0 15.9 --  HCT 41.0 40.3 35.1* 40.9 46.1 --  MCV 91.3 91.2 90.9 90.1 89.9 --  PLT 184 199 181 235 195 --   CBG:  Lab 01/09/12  0627 01/08/12 2141 01/08/12 1559 01/08/12 1104 01/08/12 0608  GLUCAP 181* 228* 374* 342* 191*    Recent Results (from the past 240 hour(s))  URINE CULTURE     Status: Normal (Preliminary result)   Collection Time   01/05/12 11:03 PM      Component Value Range Status Comment   Specimen Description URINE, RANDOM   Final    Special Requests NONE   Final    Culture  Setup Time 01/06/2012 00:35   Final    Colony Count 50,000 COLONIES/ML   Final    Culture     Final    Value: Multiple bacterial morphotypes present, none predominant. Suggest appropriate recollection if clinically indicated.   Report Status PENDING   Incomplete   MRSA PCR SCREENING     Status: Normal   Collection Time   01/06/12  4:16 AM      Component Value Range Status Comment   MRSA by PCR NEGATIVE  NEGATIVE Final      Scheduled Meds:   . aspirin  81 mg Oral Daily  . azithromycin  250 mg Oral Daily  . calcitRIOL  0.25 mcg Oral Daily  . cloNIDine  0.1 mg Oral Once  . cyanocobalamin  1,000 mcg Intramuscular Q30 days  . darbepoetin  100 mcg Subcutaneous Q Mon-1800  . diphenhydrAMINE  25 mg Oral TID  . donepezil  5 mg Oral QHS  . enoxaparin injection  40 mg Subcutaneous Daily  . famotidine  20 mg Oral BID  . feeding supplement  1 Container Oral TID BM  . hydrALAZINE  25 mg Oral Q8H  . insulin aspart  0-9 Units Subcutaneous TID WC  . insulin glargine  10 Units Subcutaneous QHS  . isosorbide mononitrate  20 mg Oral BID WC  . MVI  1 tablet Oral Daily  . pantoprazole  40 mg Oral Q1200  . prednisoLONE acetate  1 drop Both Eyes Daily  . simvastatin  10 mg Oral q1800  . vitamin B-12  1,000 mcg Oral Daily   Continuous Infusions:    Faye Ramsay, MD  [facility] Pager [phone]  If 7PM-7AM, please contact night-coverage [email or web address] Password [facility] 01/09/2012, 10:41 AM   LOS: 4 days

## 2012-01-10 DIAGNOSIS — R9431 Abnormal electrocardiogram [ECG] [EKG]: Secondary | ICD-10-CM

## 2012-01-10 LAB — CBC
MCH: 30.5 pg (ref 26.0–34.0)
MCHC: 33.9 g/dL (ref 30.0–36.0)
MCV: 90 fL (ref 78.0–100.0)
Platelets: 189 10*3/uL (ref 150–400)
RBC: 4.42 MIL/uL (ref 4.22–5.81)

## 2012-01-10 LAB — BASIC METABOLIC PANEL
CO2: 26 mEq/L (ref 19–32)
Calcium: 9.9 mg/dL (ref 8.4–10.5)
Creatinine, Ser: 1.09 mg/dL (ref 0.50–1.35)
GFR calc non Af Amer: 60 mL/min — ABNORMAL LOW (ref >90)
Glucose, Bld: 211 mg/dL — ABNORMAL HIGH (ref 70–99)

## 2012-01-10 LAB — URINE CULTURE: Colony Count: 50000

## 2012-01-10 LAB — GLUCOSE, CAPILLARY
Glucose-Capillary: 216 mg/dL — ABNORMAL HIGH (ref 70–99)
Glucose-Capillary: 346 mg/dL — ABNORMAL HIGH (ref 70–99)

## 2012-01-10 MED ORDER — TRAMADOL HCL 50 MG PO TABS: 50.0000 mg | ORAL_TABLET | Freq: Four times a day (QID) | ORAL | Status: DC | PRN

## 2012-01-10 MED ORDER — HYDRALAZINE HCL 25 MG PO TABS: 25.0000 mg | ORAL_TABLET | Freq: Three times a day (TID) | ORAL | Status: DC

## 2012-01-10 MED ORDER — CLONIDINE HCL 0.1 MG PO TABS: 0.1000 mg | ORAL_TABLET | Freq: Two times a day (BID) | ORAL | Status: DC

## 2012-01-10 NOTE — Progress Notes (Signed)
Inpatient Diabetes Program Recommendations  AACE/ADA: New Consensus Statement on Inpatient Glycemic Control (2013)  Target Ranges:  Prepandial:   less than 140 mg/dL      Peak postprandial:   less than 180 mg/dL (1-2 hours)      Critically ill patients:  140 - 180 mg/dL   Results for Eric [name], Eric [name] (MRN [id number]) as of 01/10/2012 11:59  Ref. Range 01/09/2012 10:52 01/09/2012 15:52 01/09/2012 23:50 01/10/2012 06:22 01/10/2012 11:23  Glucose-Capillary Latest Range: 70-99 mg/dL 313 (H) 379 (H) 322 (H) 216 (H) 346 (H)    Inpatient Diabetes Program Recommendations Insulin - Basal: . Correction (SSI): . Insulin - Meal Coverage: Add Novolog 4 units with meals per Glycemic Control Order set Will follow.  Thank you  Raoul Pitch RN,BSN,CDE Inpatient Diabetes Coordinator [phone] (team pager)

## 2012-01-10 NOTE — Progress Notes (Signed)
Clinical social worker assisted with patient discharge to skilled nursing facility, [facility].  CSW addressed all family questions and concerns. CSW copied chart and added all important documents. CSW also set up patient transportation with [facility]. Clinical Social Worker will sign off for now as social work intervention is no longer needed.   Dail Lerew, MSW, LCSWA [phone] 

## 2012-01-10 NOTE — Discharge Summary (Signed)
Physician Discharge Summary  Eric [name] [id number] DOB: Dec 12, 1923 DOA: 01/05/2012  PCP: Renato Shin, MD  Admit date: 01/05/2012 Discharge date: 01/10/2012  Recommendations for Outpatient Follow-up:  1. Pt will need to follow up with PCP in 2-3 weeks post discharge 2. Please obtain BMP to evaluate electrolytes and kidney function 3. Please note that pt has chronic kidney disease based on GFR but creatinine remains within normal limits 4. Please also check CBC to evaluate Hg and Hct levels 5. Please also note that 2 new blood pressure medications have been added to the pt's regimen: Clonidine and Hydralazine 6. Pt will need to have BP checked once weekly to ensure that medical regimen is adequate 7. Please readjust the BP medication regimen as indicated based on BP control  Discharge Diagnoses: Altered mental status secondary to hypertensive encephalopathy and progressive dementia  Principal Problem:  *Hypertensive encephalopathy Active Problems:  Altered mental status  Failure to thrive in adult  DM (diabetes mellitus), type 1, uncontrolled w/ophthalmic complication  CKD (chronic kidney disease) stage 3, GFR 30-59 ml/min  Hypernatremia  Bilateral leg edema  Discharge Condition: Stable  Diet recommendation: Heart healthy diet discussed in details   Brief narrative:  Pt is 78 yo male who was admitted 09/18 --> with change in mental status and of unclear etiology as pt was not able to provide history. In ED, BP found to be in 240's/140's   Principal Problem:  *Hypertensive encephalopathy  - improved mental status and pt more alert, following commands  - BP is better this AM - please note the Clonidine and hydralazine have been added during this hospital stay and this needs to be followed by PCP  Active Problems:  Altered mental status  - likely secondary to progressive failure to thrive and progressive, advancing dementia imposed on hypertensive emergency - this has  now resolved and pt is at his baseline mental status, follows commands and answers question s appropriately, alert and orient to name only  ? CAP  - based on imaging and unclear based on symptoms as pt not able to provide history at the time of admission - we treated with empiric antibiotic adn pt has completed the antibiotic therapy  Failure to thrive in adult  - nutrition consult obtained - pt improved clinically and was tolerating heart healthy diet  DM (diabetes mellitus), type 1, uncontrolled w/ophthalmic complication  - will continue home medication regimen Lantus  CKD (chronic kidney disease) stage 3, GFR 30-59 ml/min  - based on GFR but creatinine is within normal limits   Hyponatremia  - stable and at pt's baseline - encouraged PO intake   Bilateral leg edema  - improving, CXR right lower extremity negative for acute findings   Consultants:  None  Procedures/Studies:  Ct Head Wo Contrast  01/06/2012  IMPRESSION:  Atrophy with chronic microvascular ischemic change. No acute stroke or bleed.   Dg Chest Port 1 View  01/05/2012  IMPRESSION:  Patchy right basilar airspace disease could be due to atelectasis or infection.   Dg Tibia/fibula Right  01/08/2012  IMPRESSION:  Chondrocalcinosis type changes involving the knee joint.   Antibiotics:  Zithromax 09/19 --> 09/23 Rocephin 09/19 --> 09/21  Code Status: DNR  Family Communication: PT at bedside  Disposition Plan: SNF today   Discharge Exam: Filed Vitals:   01/10/12 0437  BP: 174/88  Pulse: 84  Temp: 98.3 F (36.8 C)  Resp: 18   Filed Vitals:   01/09/12 0723 01/09/12 1327 01/09/12 2140  01/10/12 0437  BP: 136/61 180/80 160/74 174/88  Pulse: 78 101 88 84  Temp:  97.6 F (36.4 C) 98.4 F (36.9 C) 98.3 F (36.8 C)  TempSrc:  Oral Oral Oral  Resp:  19 16 18   Height:      Weight:    69.4 kg (153 lb)  SpO2:  96% 100% 100%    General: Pt is alert, follows commands appropriately, not in acute  distress Cardiovascular: Regular rate and rhythm, S1/S2 +, no murmurs, no rubs, no gallops Respiratory: Clear to auscultation bilaterally, no wheezing, no crackles, no rhonchi Abdominal: Soft, non tender, non distended, bowel sounds +, no guarding Extremities: no edema, no cyanosis, pulses palpable bilaterally DP and PT Neuro: Grossly nonfocal  Discharge Instructions  Discharge Orders    Future Orders Please Complete By Expires   Diet - low sodium heart healthy      Increase activity slowly          Medication List     As of 01/10/2012  6:50 AM    TAKE these medications         acetaminophen 500 MG tablet   Commonly known as: TYLENOL   Take 1,000 mg by mouth every 6 (six) hours as needed. For pain.      aspirin 81 MG tablet   Take 81 mg by mouth daily.      calcitRIOL 0.25 MCG capsule   Commonly known as: ROCALTROL   Take 0.25 mcg by mouth daily.      capsaicin 0.025 % cream   Commonly known as: ZOSTRIX   Apply topically 2 (two) times daily. Apply to both knees AVOID contact with eyes and broken or irritated skin.      cloNIDine 0.1 MG tablet   Commonly known as: CATAPRES   Take 1 tablet (0.1 mg total) by mouth 2 (two) times daily.      cyanocobalamin 1000 MCG/ML injection   Commonly known as: (VITAMIN B-12)   Inject 1,000 mcg into the muscle every 30 (thirty) days.      vitamin B-12 1000 MCG tablet   Commonly known as: CYANOCOBALAMIN   Take 1,000 mcg by mouth daily.      darbepoetin 100 MCG/0.5ML Soln   Commonly known as: ARANESP   Inject 0.5 mLs (100 mcg total) into the skin every Monday at 6 PM.      donepezil 5 MG tablet   Commonly known as: ARICEPT   Take 5 mg by mouth at bedtime.      hydrALAZINE 25 MG tablet   Commonly known as: APRESOLINE   Take 1 tablet (25 mg total) by mouth every 8 (eight) hours.      insulin glargine 100 UNIT/ML injection   Commonly known as: LANTUS   Inject 20 Units into the skin every morning.      isosorbide mononitrate  10 MG tablet   Commonly known as: ISMO,MONOKET   Take 1 tablet (10 mg total) by mouth 2 (two) times daily.      levalbuterol 0.63 MG/3ML nebulizer solution   Commonly known as: XOPENEX   Take 3 mLs (0.63 mg total) by nebulization every 4 (four) hours as needed for wheezing or shortness of breath.      nitroGLYCERIN 0.4 MG SL tablet   Commonly known as: NITROSTAT   Place 1 tablet (0.4 mg total) under the tongue every 5 (five) minutes as needed for chest pain.      NON FORMULARY   Mighty Shake  1 by mouth twice daily at 10am and 4pm      omeprazole 20 MG capsule   Commonly known as: PRILOSEC   Take 40 mg by mouth daily.      pravastatin 80 MG tablet   Commonly known as: PRAVACHOL   Take 80 mg by mouth daily.      prednisoLONE acetate 1 % ophthalmic suspension   Commonly known as: PRED FORTE   Place 1 drop into both eyes daily.      traMADol 50 MG tablet   Commonly known as: ULTRAM   Take 1 tablet (50 mg total) by mouth every 6 (six) hours as needed.      triamterene-hydrochlorothiazide 37.5-25 MG per tablet   Commonly known as: MAXZIDE-25   Take 1 tablet by mouth daily.           Follow-up Information    Follow up with Renato Shin, MD. In 2 weeks.   Contact information:   [address] [place] [address] [phone]           The results of significant diagnostics from this hospitalization (including imaging, microbiology, ancillary and laboratory) are listed below for reference.     Microbiology: Recent Results (from the past 240 hour(s))  URINE CULTURE     Status: Normal (Preliminary result)   Collection Time   01/05/12 11:03 PM      Component Value Range Status Comment   Specimen Description URINE, RANDOM   Final    Special Requests NONE   Final    Culture  Setup Time 01/06/2012 00:35   Final    Colony Count 50,000 COLONIES/ML   Final    Culture     Final    Value: Multiple bacterial morphotypes present, none predominant.  Suggest appropriate recollection if clinically indicated.   Report Status PENDING   Incomplete   MRSA PCR SCREENING     Status: Normal   Collection Time   01/06/12  4:16 AM      Component Value Range Status Comment   MRSA by PCR NEGATIVE  NEGATIVE Final      Labs: Basic Metabolic Panel:  Lab 0000000 0416 01/08/12 0510 01/07/12 0648 01/06/12 1130 01/06/12 0625 01/05/12 2121  NA 130* 135 138 132* -- 135  K 3.8 3.9 3.2* 3.8 -- 4.5  CL 97 101 105 101 -- 98  CO2 22 25 25 23  -- 25  GLUCOSE 215* 211* 104* 250* -- 336*  BUN 21 18 14 19  -- 21  CREATININE 1.06 1.02 1.14 0.96 0.96 --  CALCIUM 9.9 9.8 9.4 9.8 -- 11.3*  MG -- -- -- -- -- --  PHOS -- -- -- -- -- --   Liver Function Tests:  Lab 01/05/12 2121  AST 26  ALT 18  ALKPHOS 127*  BILITOT 0.4  PROT 8.5*  ALBUMIN 3.1*   CBC:  Lab 01/09/12 0416 01/08/12 0510 01/07/12 0648 01/06/12 0625 01/05/12 2121 01/04/12 1538  WBC 7.1 6.6 6.2 8.3 9.8 --  NEUTROABS -- -- -- -- 8.2* 4.4  HGB 14.0 13.4 11.8* 14.0 15.9 --  HCT 41.0 40.3 35.1* 40.9 46.1 --  MCV 91.3 91.2 90.9 90.1 89.9 --  PLT 184 199 181 235 195 --     Basename 01/05/12 2123 10/02/11 1938 10/01/11 1036  PROBNP 581.9* 396.1 1962.0*   CBG:  Lab 01/09/12 2350 01/09/12 1552 01/09/12 1052 01/09/12 0627 01/08/12 2141  GLUCAP 322* 379* 313* 181* 228*   SIGNED: Time coordinating  discharge: Over 30 minutes  Faye Ramsay, MD  [facility] 01/10/2012, 6:50 AM Pager [phone]  If 7PM-7AM, please contact night-coverage [email or web address] Password [email or web address]

## 2012-01-10 NOTE — Progress Notes (Signed)
Report given to [name] from [facility] . Spoken to pt's family about transfer

## 2012-01-10 NOTE — Progress Notes (Signed)
Transferred to [facility] via [facility]

## 2012-01-10 NOTE — Progress Notes (Addendum)
Physical Therapy Treatment Patient Details Name: Eric [name] MRN: [id number] DOB: [birth year] Today's Date: 01/10/2012 Time: [id number] PT Time Calculation (min): 29 min  PT Assessment / Plan / Recommendation Comments on Treatment Session  Patient s/p HTN encephalopathy with decr mobility secondary to decr endurance and decr cognition.  Will benefit from continued PT to address endrurance and balance.     Follow Up Recommendations  Skilled nursing facility;Supervision/Assistance - 24 hour    Barriers to Discharge        Equipment Recommendations  Defer to next venue    Recommendations for Other Services    Frequency Min 3X/week   Plan Discharge plan remains appropriate    Precautions / Restrictions Precautions Precautions: Fall Restrictions Weight Bearing Restrictions: No   Pertinent Vitals/Pain VSS, No pain    Mobility  Bed Mobility Bed Mobility: Supine to Sit;Sitting - Scoot to Edge of Bed Supine to Sit: 3: Mod assist;With rails;HOB flat Sitting - Scoot to Edge of Bed: 3: Mod assist Details for Bed Mobility Assistance: incr time and cues to complete task Transfers Transfers: Sit to Stand;Stand to Sit;Stand Pivot Transfers Sit to Stand: 1: +2 Total assist;From bed;With upper extremity assist Sit to Stand: Patient Percentage: 60% Stand to Sit: 1: +2 Total assist;With upper extremity assist;To chair/3-in-1;With armrests Stand to Sit: Patient Percentage: 60% Stand Pivot Transfers: 1: +2 Total assist Stand Pivot Transfers: Patient Percentage: 60% Details for Transfer Assistance: verbal cues for safe hand placement and to scoot to EOB before attempting to stand.  Cues to stand upright, extending hips and knees.  Patient with difficutly with stand to sit once he saw chair, he began to sit.  Verbal and tactile cues to remain upright and back up to chair before sitting.   Ambulation/Gait Ambulation/Gait Assistance: Not tested (comment) Stairs: No Wheelchair  Mobility Wheelchair Mobility: No    PT Goals Acute Rehab PT Goals PT Goal: Supine/Side to Sit - Progress: Progressing toward goal PT Goal: Sit to Stand - Progress: Progressing toward goal PT Goal: Stand to Sit - Progress: Progressing toward goal PT Transfer Goal: Bed to Chair/Chair to Bed - Progress: Progressing toward goal  Visit Information  Last PT Received On: 01/10/12 Assistance Needed: +2    Subjective Data  Subjective: "I feel like I can get to the chair."   Cognition  Overall Cognitive Status: No family/caregiver present to determine baseline cognitive functioning Arousal/Alertness: Awake/alert Orientation Level: Disoriented to;Place;Time;Situation Behavior During Session: [facility] for tasks performed    Balance  Static Sitting Balance Static Sitting - Balance Support: Bilateral upper extremity supported;Feet supported Static Sitting - Level of Assistance: 6: Modified independent (Device/Increase time) Static Sitting - Comment/# of Minutes: 2 minutes Static Standing Balance Static Standing - Balance Support: Bilateral upper extremity supported;During functional activity Static Standing - Level of Assistance: 3: Mod assist Static Standing - Comment/# of Minutes: 2 minutes to be cleaned as patient having BM once he stood up.  Cleaned with total assist and mod assist to steady patient holding onto RW.    End of Session PT - End of Session Equipment Utilized During Treatment: Gait belt Activity Tolerance: Patient limited by fatigue Patient left: in chair;with call bell/phone within reach Nurse Communication: Mobility status       INGOLD,Vanessia Bokhari 01/10/2012, 10:53 AM  [facility] [phone] (pager)

## 2012-01-16 ENCOUNTER — Observation Stay (HOSPITAL_COMMUNITY)
Admission: EM | Admit: 2012-01-16 | Discharge: 2012-01-17 | Disposition: A | Discharge status: Skilled Nursing Facility | Payer: Medicare Other | Attending: Emergency Medicine | Admitting: Emergency Medicine

## 2012-01-16 ENCOUNTER — Encounter (HOSPITAL_COMMUNITY): Payer: Self-pay | Admitting: Emergency Medicine

## 2012-01-16 ENCOUNTER — Emergency Department (HOSPITAL_COMMUNITY): Payer: Medicare Other

## 2012-01-16 DIAGNOSIS — M1990 Unspecified osteoarthritis, unspecified site: Secondary | ICD-10-CM | POA: Insufficient documentation

## 2012-01-16 DIAGNOSIS — E1039 Type 1 diabetes mellitus with other diabetic ophthalmic complication: Secondary | ICD-10-CM | POA: Insufficient documentation

## 2012-01-16 DIAGNOSIS — E11319 Type 2 diabetes mellitus with unspecified diabetic retinopathy without macular edema: Secondary | ICD-10-CM | POA: Insufficient documentation

## 2012-01-16 DIAGNOSIS — E871 Hypo-osmolality and hyponatremia: Secondary | ICD-10-CM

## 2012-01-16 DIAGNOSIS — R4182 Altered mental status, unspecified: Principal | ICD-10-CM | POA: Insufficient documentation

## 2012-01-16 DIAGNOSIS — E860 Dehydration: Secondary | ICD-10-CM

## 2012-01-16 DIAGNOSIS — E162 Hypoglycemia, unspecified: Secondary | ICD-10-CM

## 2012-01-16 DIAGNOSIS — K219 Gastro-esophageal reflux disease without esophagitis: Secondary | ICD-10-CM | POA: Insufficient documentation

## 2012-01-16 DIAGNOSIS — I10 Essential (primary) hypertension: Secondary | ICD-10-CM | POA: Insufficient documentation

## 2012-01-16 HISTORY — DX: Localized edema: R60.0

## 2012-01-16 HISTORY — DX: Hypo-osmolality and hyponatremia: E87.1

## 2012-01-16 HISTORY — DX: Pneumonia, unspecified organism: J18.9

## 2012-01-16 HISTORY — DX: Chronic kidney disease, unspecified: N18.9

## 2012-01-16 HISTORY — DX: Adult failure to thrive: R62.7

## 2012-01-16 LAB — URINALYSIS, ROUTINE W REFLEX MICROSCOPIC
Leukocytes, UA: NEGATIVE
Nitrite: NEGATIVE
Specific Gravity, Urine: 1.012 (ref 1.005–1.030)
Urobilinogen, UA: 0.2 mg/dL (ref 0.0–1.0)
pH: 6 (ref 5.0–8.0)

## 2012-01-16 LAB — COMPREHENSIVE METABOLIC PANEL
CO2: 20 mEq/L (ref 19–32)
Calcium: 10.6 mg/dL — ABNORMAL HIGH (ref 8.4–10.5)
Creatinine, Ser: 1.05 mg/dL (ref 0.50–1.35)
GFR calc Af Amer: 72 mL/min — ABNORMAL LOW (ref >90)
GFR calc non Af Amer: 62 mL/min — ABNORMAL LOW (ref >90)
Glucose, Bld: 169 mg/dL — ABNORMAL HIGH (ref 70–99)
Sodium: 128 mEq/L — ABNORMAL LOW (ref 135–145)
Total Protein: 7.6 g/dL (ref 6.0–8.3)

## 2012-01-16 LAB — GLUCOSE, CAPILLARY
Glucose-Capillary: 188 mg/dL — ABNORMAL HIGH (ref 70–99)
Glucose-Capillary: 165 mg/dL — ABNORMAL HIGH (ref 70–99)
Glucose-Capillary: 95 mg/dL (ref 70–99)
Glucose-Capillary: 104 mg/dL — ABNORMAL HIGH (ref 70–99)

## 2012-01-16 LAB — CBC
Hemoglobin: 14.2 g/dL (ref 13.0–17.0)
MCH: 30.6 pg (ref 26.0–34.0)
MCHC: 34 g/dL (ref 30.0–36.0)
MCV: 90.1 fL (ref 78.0–100.0)
RBC: 4.64 MIL/uL (ref 4.22–5.81)

## 2012-01-16 LAB — URINE MICROSCOPIC-ADD ON

## 2012-01-16 MED ORDER — SODIUM CHLORIDE 0.9 % IV BOLUS (SEPSIS)
750.0000 mL | Freq: Once | INTRAVENOUS | Status: AC
  Administered 2012-01-16: 750 mL via INTRAVENOUS

## 2012-01-16 MED ORDER — LABETALOL HCL 5 MG/ML IV SOLN
INTRAVENOUS | Status: AC
  Filled 2012-01-16: qty 4

## 2012-01-16 MED ORDER — LABETALOL HCL 5 MG/ML IV SOLN
10.0000 mg | Freq: Once | INTRAVENOUS | Status: AC
  Administered 2012-01-16: 10 mg via INTRAVENOUS

## 2012-01-16 MED ORDER — LABETALOL HCL 5 MG/ML IV SOLN
10.0000 mg | Freq: Once | INTRAVENOUS | Status: AC
  Administered 2012-01-16: 10 mg via INTRAVENOUS
  Filled 2012-01-16: qty 4

## 2012-01-16 NOTE — ED Notes (Signed)
INCONTINENT CARE DONE , COMPLETE LINEN CHANGE , REPOSITIONED FOR COMFORT , WARM BLANKETS PROVIDED.

## 2012-01-16 NOTE — ED Notes (Signed)
Spoke with Dr Prince Rome regarding pts blood pressure, will give 10mg  of Labetalol via IVP for blood pressure control.

## 2012-01-16 NOTE — ED Notes (Signed)
Received report from [place] and now resuming care of patient.

## 2012-01-16 NOTE — ED Notes (Signed)
Pt transported to xray via stretcher at this time.

## 2012-01-16 NOTE — ED Provider Notes (Signed)
History     CSN: [id number]  Arrival date & time 01/16/12  1010   First MD Initiated Contact with Patient 01/16/12 1014      Chief Complaint  Patient presents with  . Altered Mental Status    (Consider location/radiation/quality/duration/timing/severity/associated sxs/prior treatment) HPI Comments: Mr. Filkins presents via EMS from a local nursing home for evaluation.  He was found having seizure-like activity in his bed.  When EMS arrived he was not seizing but appeared lethargic and he could not be aroused.  His blood sugar was checked and noted to be 64.  The nursing home staff reported his baseline blood sugar to always be greater than 100.  D10 was administered and an immediate response was noted.  They also stated that he had a recent increase in his insulin doses secondary to persistently elevated blood sugar.  Mr. Cruze states he felt funny this morning but has no recollection of the events described above.  Other than chronic aching in his knees, he denies any other complaints.  Patient is a 78 y.o. male presenting with altered mental status. The history is provided by the patient and the EMS personnel. No language interpreter was used.  Altered Mental Status This is a new problem. Episode onset: unsure of onset. The problem has been rapidly improving. Pertinent negatives include no chest pain, no abdominal pain, no headaches and no shortness of breath.    Past Medical History  Diagnosis Date  . ANEMIA-NOS 02/17/2007  . DEPRESSION 12/25/2008  . HYPERTENSION 02/17/2007  . GERD 02/17/2007  . OSTEOARTHRITIS 03/04/2010  . DIABETES MELLITUS, TYPE I, CONTROLLED, WITH RETINOPATHY   . Gastroparesis   . BENIGN PROSTATIC HYPERTROPHY   . VITAMIN B12 DEFICIENCY   . BACK PAIN, LUMBAR   . LIVER DISORDER   . PSA, INCREASED   . Hyperlipidemia   . Diabetes mellitus   . Pneumonia   . Anxiety   . Encephalopathy, metabolic   . Altered mental status   . CAP (community acquired  pneumonia)   . Adult failure to thrive   . Hyponatremia   . CKD (chronic kidney disease)   . Bilateral leg edema     Past Surgical History  Procedure Date  . Transurethral resection of prostate     Family History  Problem Relation Age of Onset  . Cancer Father     had uncertain type of cancer  . Cancer Brother     Prostate Cancer  . Cancer Brother     Prostate Cancer    History  Substance Use Topics  . Smoking status: Never Smoker   . Smokeless tobacco: Never Used  . Alcohol Use: No      Review of Systems  Constitutional: Negative for fever, chills, appetite change and fatigue.  HENT: Negative for congestion, sore throat, facial swelling, rhinorrhea, sneezing, trouble swallowing, neck pain, neck stiffness and voice change.   Eyes: Negative.   Respiratory: Negative for cough, chest tightness and shortness of breath.   Cardiovascular: Negative for chest pain, palpitations and leg swelling.  Gastrointestinal: Negative for nausea, vomiting, abdominal pain and diarrhea.  Genitourinary: Negative for dysuria, urgency and flank pain.  Musculoskeletal: Negative for myalgias, back pain and arthralgias.  Skin: Negative for rash.  Neurological: Negative for syncope, weakness, light-headedness, numbness and headaches.  Psychiatric/Behavioral: Positive for altered mental status.    Allergies  Percocet  Home Medications   Current Outpatient Rx  Name Route Sig Dispense Refill  . ACETAMINOPHEN 500 MG PO TABS  Oral Take 1,000 mg by mouth every 6 (six) hours as needed. For pain.     . ASPIRIN 81 MG PO TABS Oral Take 81 mg by mouth daily.      Marland Kitchen CALCITRIOL 0.25 MCG PO CAPS Oral Take 0.25 mcg by mouth daily.     Marland Kitchen CAPSAICIN 0.025 % EX CREA Topical Apply topically 2 (two) times daily. Apply to both knees AVOID contact with eyes and broken or irritated skin.    Marland Kitchen VITAMIN D 2000 UNITS PO CAPS Oral Take 2,000 Units by mouth daily.    Marland Kitchen CLONIDINE HCL 0.1 MG PO TABS Oral Take 1 tablet  (0.1 mg total) by mouth 2 (two) times daily. 60 tablet 0  . DARBEPOETIN ALFA-POLYSORBATE 100 MCG/0.5ML IJ SOLN Subcutaneous Inject 100 mcg into the skin every 14 (fourteen) days.    . DOCUSATE SODIUM 100 MG PO CAPS Oral Take 100 mg by mouth 2 (two) times daily.    . DONEPEZIL HCL 5 MG PO TABS Oral Take 5 mg by mouth at bedtime.     Marland Kitchen HYDRALAZINE HCL 25 MG PO TABS Oral Take 1 tablet (25 mg total) by mouth every 8 (eight) hours. 90 tablet 0  . INSULIN ASPART 100 UNIT/ML [place] SOLN Subcutaneous Inject 6 Units into the skin 3 (three) times daily before meals.    . INSULIN GLARGINE 100 UNIT/ML [place] SOLN Subcutaneous Inject 20 Units into the skin every morning.     . INSULIN LISPRO (HUMAN) 100 UNIT/ML [place] SOLN Subcutaneous Inject 2-15 Units into the skin 3 (three) times daily before meals. Sliding scale    . ISOSORBIDE MONONITRATE 10 MG PO TABS Oral Take 1 tablet (10 mg total) by mouth 2 (two) times daily. 180 tablet 1  . LEVALBUTEROL HCL 0.63 MG/3ML IN NEBU Nebulization Take 3 mLs (0.63 mg total) by nebulization every 4 (four) hours as needed for wheezing or shortness of breath.    Marland Kitchen NITROGLYCERIN 0.4 MG SL SUBL Sublingual Place 1 tablet (0.4 mg total) under the tongue every 5 (five) minutes as needed for chest pain. 10 tablet 0  . OMEPRAZOLE 20 MG PO CPDR Oral Take 40 mg by mouth daily.    Marland Kitchen POLYETHYLENE GLYCOL 3350 PO PACK Oral Take 17 g by mouth at bedtime.    Marland Kitchen PRAVASTATIN SODIUM 80 MG PO TABS Oral Take 80 mg by mouth daily.    Marland Kitchen PREDNISOLONE ACETATE 1 % OP SUSP Both Eyes Place 1 drop into both eyes daily.    . TRAMADOL HCL 50 MG PO TABS Oral Take 1 tablet (50 mg total) by mouth every 6 (six) hours as needed. 30 tablet 0  . TRIAMTERENE-HCTZ 37.5-25 MG PO TABS Oral Take 1 tablet by mouth daily.     Marland Kitchen VITAMIN B-12 1000 MCG PO TABS Oral Take 1,000 mcg by mouth daily.    . NON FORMULARY  Mighty Shake  1 by mouth twice daily at 10am and 4pm      BP 187/78  Pulse 96  Temp 97.9 F (36.6 C) (Oral)  Resp  14  Ht 5\' 10"  (1.778 m)  Wt 160 lb (72.576 kg)  BMI 22.96 kg/m2  SpO2 96%  Physical Exam  Nursing note and vitals reviewed. Constitutional: He appears well-developed and well-nourished. No distress.  HENT:  Head: Normocephalic and atraumatic.  Right Ear: External ear normal.  Left Ear: External ear normal.  Nose: Nose normal.  Mouth/Throat: Oropharynx is clear and moist. No oropharyngeal exudate.  Eyes: Conjunctivae normal  and EOM are normal. Pupils are equal, round, and reactive to light. Right eye exhibits no discharge. Left eye exhibits no discharge. No scleral icterus.  Neck: Normal range of motion. Neck supple. No JVD present. No tracheal deviation present.  Cardiovascular: Normal rate, regular rhythm, normal heart sounds and intact distal pulses.  Exam reveals no gallop and no friction rub.   No murmur heard. Pulmonary/Chest: Effort normal and breath sounds normal. No stridor. No respiratory distress. He has no wheezes. He has no rales. He exhibits no tenderness.  Abdominal: Soft. Bowel sounds are normal. He exhibits no distension and no mass. There is no tenderness. There is no rebound and no guarding.  Musculoskeletal: Normal range of motion. He exhibits no edema and no tenderness.  Lymphadenopathy:    He has no cervical adenopathy.  Neurological: He is alert. He has normal strength. He is disoriented. He displays tremor. He displays no atrophy. No cranial nerve deficit or sensory deficit. He exhibits normal muscle tone. He displays no seizure activity. GCS eye subscore is 4. GCS verbal subscore is 5. GCS motor subscore is 6. He displays no Babinski's sign on the right side. He displays no Babinski's sign on the left side.       Disoriented to recent events, time, and exact location.  He is oriented to person and realizes he is not in his nursing home.  Skin: Skin is warm and dry. No rash noted. He is not diaphoretic. No erythema. No pallor.  Psychiatric: He has a normal mood and  affect. His behavior is normal. Judgment and thought content normal.    ED Course  Procedures (including critical care time)  Labs Reviewed  GLUCOSE, CAPILLARY - Abnormal; Notable for the following:    Glucose-Capillary 188 (*)     All other components within normal limits  CBC  COMPREHENSIVE METABOLIC PANEL  URINALYSIS, ROUTINE W REFLEX MICROSCOPIC   No results found.   No diagnosis found.    MDM  Pt presents for evaluation of altered mental status.  He was unable to be aroused by EMS and they noted his blood sugar to be 64.  After 1 amp D10, he was awake and talking.  Pt currently sleeping, easily aroused, note elevated BP, NAD.  Plan basic labs, U/A, CXR, serial exams and blood sugar checks.  He hs recently had changes made to his insulin dose that may be contributing to today's events.  1515.  Pt stable, NAD.  Note mild hyponatremia.  Gentle IVF bolus has been ordered.  CXR demonstrates a LLL opacity.  Reviewed multiple previous films.  He appears to have a chronic opacity in the same location and today, clinically, he does not demonstrate any evidence of PNA.  He has no fever, leukocytosis, chest discomfort, cough, or even appear to be ill.  Will not treat at this time.  Plan extended ER obs under the CDU hypoglycemia protocol.      Perlie Mayo, MD 01/16/12 [phone]

## 2012-01-16 NOTE — ED Notes (Addendum)
Pt.  Has a  Macular rash to both of his anterior thighs and shins.   The rash is very red and warm to touch

## 2012-01-16 NOTE — ED Notes (Signed)
Per [facility], called out to [facility] for seizure.  States on arrival, pt would only open eyes to verbal stimuli.  Initial CBG 64; D10 given and pt would try to hold conversation after med.  Repeat CBG by EMS was 161.  Staff reported pt's "arm shook a little" which is what they reported as the seizure activity.  Questionable hx of seizures.  Staff also reported that pt recently had changes to insulin dose (increased).

## 2012-01-16 NOTE — ED Notes (Signed)
Pt noted to be trying to open his brief.  States he needs to urinate.  Brief already soaked with urine.  Urinal and assistance provided, however, pt unable to urinate into urinal.  New brief applied.

## 2012-01-16 NOTE — ED Notes (Signed)
Pt returned from xray at this time.

## 2012-01-16 NOTE — ED Notes (Signed)
Dr. Prince Rome at pt bedside at this time.

## 2012-01-16 NOTE — ED Provider Notes (Signed)
5:00 PM Assumed care of patient from Dr. Prince Rome.  Patient presented today from nursing home.  He has been having adjustments made to his DM medications and was found to be hypoglycemic prior to arrival.  Upon arrival in the ED the blood sugar was 161.  Plan is for the patient to be observed until midnight and then reassess.  If patient's blood sugars do not drop again and patient remains alert, then he will be discharged back to the nursing home.    7:00 PM Reassessed patient.  Patient is alert.  Patient is orientated to person.  He realizes that he is in the hospital and not at his nursing home.  Patient able to follow commands.  No acute distress.  His blood sugar has remained in the 100-120 range.    11:49 PM Reassessed patient.  Patient in no acute distress.  Patient denies any pain at this time.  Patient able to communicate.  He is alert.  He is able to tell me his name and that he is at [facility].  He is also able to tell me his birthdate.  He is unsure of what day or month it is right now.  Blood sugar taken a few minutes ago was 126.  Therefore, feel that patient can be discharged back to his Nursing home.  Sherlyn Lees [place], PA-C 01/17/12 [phone]

## 2012-01-17 NOTE — ED Notes (Signed)
Report was given to [place], Therapist, sports at [place].

## 2012-01-24 ENCOUNTER — Ambulatory Visit: Payer: Medicare Other | Admitting: Endocrinology

## 2012-01-24 DIAGNOSIS — Z0289 Encounter for other administrative examinations: Secondary | ICD-10-CM

## 2012-01-24 NOTE — ED Provider Notes (Signed)
Medical screening examination/treatment/procedure(s) were conducted as a shared visit with non-physician practitioner(s) and myself.  I personally evaluated the patient during the encounter  Perlie Mayo, MD 01/24/12 2128

## 2012-02-08 ENCOUNTER — Emergency Department (HOSPITAL_COMMUNITY): Payer: Medicare Other

## 2012-02-08 ENCOUNTER — Encounter (HOSPITAL_COMMUNITY): Payer: Self-pay | Admitting: *Deleted

## 2012-02-08 ENCOUNTER — Inpatient Hospital Stay (HOSPITAL_COMMUNITY)
Admission: EM | Admit: 2012-02-08 | Discharge: 2012-02-11 | DRG: 101 | Disposition: A | Discharge status: Skilled Nursing Facility | Payer: Medicare Other | Attending: Internal Medicine | Admitting: Internal Medicine

## 2012-02-08 DIAGNOSIS — R911 Solitary pulmonary nodule: Secondary | ICD-10-CM | POA: Diagnosis present

## 2012-02-08 DIAGNOSIS — E871 Hypo-osmolality and hyponatremia: Secondary | ICD-10-CM

## 2012-02-08 DIAGNOSIS — J209 Acute bronchitis, unspecified: Secondary | ICD-10-CM

## 2012-02-08 DIAGNOSIS — G9341 Metabolic encephalopathy: Secondary | ICD-10-CM

## 2012-02-08 DIAGNOSIS — E870 Hyperosmolality and hypernatremia: Secondary | ICD-10-CM

## 2012-02-08 DIAGNOSIS — J984 Other disorders of lung: Secondary | ICD-10-CM | POA: Diagnosis present

## 2012-02-08 DIAGNOSIS — E1039 Type 1 diabetes mellitus with other diabetic ophthalmic complication: Secondary | ICD-10-CM

## 2012-02-08 DIAGNOSIS — R9431 Abnormal electrocardiogram [ECG] [EKG]: Secondary | ICD-10-CM

## 2012-02-08 DIAGNOSIS — R600 Localized edema: Secondary | ICD-10-CM

## 2012-02-08 DIAGNOSIS — R300 Dysuria: Secondary | ICD-10-CM

## 2012-02-08 DIAGNOSIS — I674 Hypertensive encephalopathy: Secondary | ICD-10-CM

## 2012-02-08 DIAGNOSIS — W19XXXA Unspecified fall, initial encounter: Secondary | ICD-10-CM

## 2012-02-08 DIAGNOSIS — M1990 Unspecified osteoarthritis, unspecified site: Secondary | ICD-10-CM

## 2012-02-08 DIAGNOSIS — K3184 Gastroparesis: Secondary | ICD-10-CM

## 2012-02-08 DIAGNOSIS — N400 Enlarged prostate without lower urinary tract symptoms: Secondary | ICD-10-CM

## 2012-02-08 DIAGNOSIS — R05 Cough: Secondary | ICD-10-CM

## 2012-02-08 DIAGNOSIS — R4182 Altered mental status, unspecified: Secondary | ICD-10-CM

## 2012-02-08 DIAGNOSIS — E785 Hyperlipidemia, unspecified: Secondary | ICD-10-CM

## 2012-02-08 DIAGNOSIS — J189 Pneumonia, unspecified organism: Secondary | ICD-10-CM

## 2012-02-08 DIAGNOSIS — E1139 Type 2 diabetes mellitus with other diabetic ophthalmic complication: Secondary | ICD-10-CM | POA: Diagnosis present

## 2012-02-08 DIAGNOSIS — M545 Low back pain, unspecified: Secondary | ICD-10-CM

## 2012-02-08 DIAGNOSIS — R059 Cough, unspecified: Secondary | ICD-10-CM

## 2012-02-08 DIAGNOSIS — G9340 Encephalopathy, unspecified: Secondary | ICD-10-CM

## 2012-02-08 DIAGNOSIS — R109 Unspecified abdominal pain: Secondary | ICD-10-CM

## 2012-02-08 DIAGNOSIS — M79609 Pain in unspecified limb: Secondary | ICD-10-CM

## 2012-02-08 DIAGNOSIS — Z77090 Contact with and (suspected) exposure to asbestos: Secondary | ICD-10-CM

## 2012-02-08 DIAGNOSIS — M25569 Pain in unspecified knee: Secondary | ICD-10-CM

## 2012-02-08 DIAGNOSIS — E119 Type 2 diabetes mellitus without complications: Secondary | ICD-10-CM

## 2012-02-08 DIAGNOSIS — R569 Unspecified convulsions: Secondary | ICD-10-CM

## 2012-02-08 DIAGNOSIS — R531 Weakness: Secondary | ICD-10-CM

## 2012-02-08 DIAGNOSIS — D649 Anemia, unspecified: Secondary | ICD-10-CM

## 2012-02-08 DIAGNOSIS — F329 Major depressive disorder, single episode, unspecified: Secondary | ICD-10-CM

## 2012-02-08 DIAGNOSIS — E11319 Type 2 diabetes mellitus with unspecified diabetic retinopathy without macular edema: Secondary | ICD-10-CM | POA: Diagnosis present

## 2012-02-08 DIAGNOSIS — I10 Essential (primary) hypertension: Secondary | ICD-10-CM

## 2012-02-08 DIAGNOSIS — E538 Deficiency of other specified B group vitamins: Secondary | ICD-10-CM

## 2012-02-08 DIAGNOSIS — R609 Edema, unspecified: Secondary | ICD-10-CM

## 2012-02-08 DIAGNOSIS — Y92009 Unspecified place in unspecified non-institutional (private) residence as the place of occurrence of the external cause: Secondary | ICD-10-CM

## 2012-02-08 DIAGNOSIS — R739 Hyperglycemia, unspecified: Secondary | ICD-10-CM

## 2012-02-08 DIAGNOSIS — D472 Monoclonal gammopathy: Secondary | ICD-10-CM

## 2012-02-08 DIAGNOSIS — IMO0002 Reserved for concepts with insufficient information to code with codable children: Secondary | ICD-10-CM

## 2012-02-08 DIAGNOSIS — R1310 Dysphagia, unspecified: Secondary | ICD-10-CM

## 2012-02-08 DIAGNOSIS — R972 Elevated prostate specific antigen [PSA]: Secondary | ICD-10-CM

## 2012-02-08 DIAGNOSIS — F3289 Other specified depressive episodes: Secondary | ICD-10-CM

## 2012-02-08 DIAGNOSIS — I129 Hypertensive chronic kidney disease with stage 1 through stage 4 chronic kidney disease, or unspecified chronic kidney disease: Secondary | ICD-10-CM | POA: Diagnosis present

## 2012-02-08 DIAGNOSIS — Z66 Do not resuscitate: Secondary | ICD-10-CM | POA: Diagnosis present

## 2012-02-08 DIAGNOSIS — G40909 Epilepsy, unspecified, not intractable, without status epilepticus: Principal | ICD-10-CM | POA: Diagnosis present

## 2012-02-08 DIAGNOSIS — R55 Syncope and collapse: Secondary | ICD-10-CM

## 2012-02-08 DIAGNOSIS — M25529 Pain in unspecified elbow: Secondary | ICD-10-CM

## 2012-02-08 DIAGNOSIS — K219 Gastro-esophageal reflux disease without esophagitis: Secondary | ICD-10-CM

## 2012-02-08 DIAGNOSIS — R634 Abnormal weight loss: Secondary | ICD-10-CM

## 2012-02-08 DIAGNOSIS — K769 Liver disease, unspecified: Secondary | ICD-10-CM

## 2012-02-08 DIAGNOSIS — N183 Chronic kidney disease, stage 3 unspecified: Secondary | ICD-10-CM

## 2012-02-08 DIAGNOSIS — R627 Adult failure to thrive: Secondary | ICD-10-CM

## 2012-02-08 DIAGNOSIS — J309 Allergic rhinitis, unspecified: Secondary | ICD-10-CM

## 2012-02-08 DIAGNOSIS — R040 Epistaxis: Secondary | ICD-10-CM

## 2012-02-08 DIAGNOSIS — E11649 Type 2 diabetes mellitus with hypoglycemia without coma: Secondary | ICD-10-CM

## 2012-02-08 DIAGNOSIS — R7989 Other specified abnormal findings of blood chemistry: Secondary | ICD-10-CM

## 2012-02-08 LAB — CBC WITH DIFFERENTIAL/PLATELET
Eosinophils Relative: 2 % (ref 0–5)
HCT: 41.2 % (ref 39.0–52.0)
Lymphocytes Relative: 25 % (ref 12–46)
Lymphs Abs: 1.6 10*3/uL (ref 0.7–4.0)
MCV: 90 fL (ref 78.0–100.0)
Monocytes Absolute: 0.2 10*3/uL (ref 0.1–1.0)
RDW: 14.3 % (ref 11.5–15.5)
WBC: 6.4 10*3/uL (ref 4.0–10.5)

## 2012-02-08 LAB — URINE MICROSCOPIC-ADD ON

## 2012-02-08 LAB — URINALYSIS, ROUTINE W REFLEX MICROSCOPIC
Glucose, UA: NEGATIVE mg/dL
Hgb urine dipstick: NEGATIVE
Ketones, ur: NEGATIVE mg/dL
Protein, ur: 100 mg/dL — AB

## 2012-02-08 LAB — BASIC METABOLIC PANEL
BUN: 26 mg/dL — ABNORMAL HIGH (ref 6–23)
CO2: 21 mEq/L (ref 19–32)
Calcium: 10 mg/dL (ref 8.4–10.5)
Creatinine, Ser: 1.48 mg/dL — ABNORMAL HIGH (ref 0.50–1.35)
Glucose, Bld: 147 mg/dL — ABNORMAL HIGH (ref 70–99)

## 2012-02-08 MED ORDER — SODIUM CHLORIDE 0.9 % IV SOLN
1000.0000 mg | Freq: Once | INTRAVENOUS | Status: AC
  Administered 2012-02-08: 1000 mg via INTRAVENOUS
  Filled 2012-02-08: qty 20

## 2012-02-08 MED ORDER — LORAZEPAM 2 MG/ML IJ SOLN
INTRAMUSCULAR | Status: AC
  Filled 2012-02-08: qty 1

## 2012-02-08 MED ORDER — MOXIFLOXACIN HCL IN NACL 400 MG/250ML IV SOLN
400.0000 mg | INTRAVENOUS | Status: DC
  Administered 2012-02-09: 400 mg via INTRAVENOUS
  Filled 2012-02-08 (×2): qty 250

## 2012-02-08 MED ORDER — SODIUM CHLORIDE 0.9 % IV BOLUS (SEPSIS)
1000.0000 mL | Freq: Once | INTRAVENOUS | Status: AC
  Administered 2012-02-08: 1000 mL via INTRAVENOUS

## 2012-02-08 MED ORDER — LORAZEPAM 2 MG/ML IJ SOLN
1.0000 mg | Freq: Once | INTRAMUSCULAR | Status: AC
  Administered 2012-02-08: 1 mg via INTRAVENOUS

## 2012-02-08 NOTE — ED Notes (Signed)
Tech in to do i/o cath. Pt started with seizing activity. Pt did have some slight trauma to mouth.  md called  to bedside.  Suction and non-rebreather placed.  Ativan 1 mg given ivp.  Pt had what appeared to be 2 mins of seizure activity.

## 2012-02-08 NOTE — Consult Note (Addendum)
Reason for Consult: Seizure Referring Physician: Vanita Panda, R  CC: Seizure  History is obtained from: Son  HPI: Eric [name] is an 78 y.o. male who is at the nursing home earlier today when he was seen to have fallen, and was shaking when first seen. He lost his bladder function and he became unconscious. His transported to the emergency room where he was found to be confused, but had improved and was oriented. In the emergency room he had a repeat seizure, and was given 1 mg of Ativan which stopped the seizure. His blood glucose with this episode was 141.  Of note, last month he had a similar episode but his blood glucose was found to be in the low 60s and it was attributed to hypoglycemia.   ROS: Unable to perform due to altered mental  Past Medical History  Diagnosis Date  . ANEMIA-NOS 02/17/2007  . DEPRESSION 12/25/2008  . HYPERTENSION 02/17/2007  . GERD 02/17/2007  . OSTEOARTHRITIS 03/04/2010  . DIABETES MELLITUS, TYPE I, CONTROLLED, WITH RETINOPATHY   . Gastroparesis   . BENIGN PROSTATIC HYPERTROPHY   . VITAMIN B12 DEFICIENCY   . BACK PAIN, LUMBAR   . LIVER DISORDER   . PSA, INCREASED   . Hyperlipidemia   . Diabetes mellitus   . Pneumonia   . Anxiety   . Encephalopathy, metabolic   . Altered mental status   . CAP (community acquired pneumonia)   . Adult failure to thrive   . Hyponatremia   . CKD (chronic kidney disease)   . Bilateral leg edema     Family History: Unable to obtain due to altered mental status  Social History: Tob: Unable to obtain due to altered mental  Exam: Current vital signs: BP 212/91  Pulse 75  Temp 98.5 F (36.9 C) (Oral)  Resp 20  SpO2 99% Vital signs in last 24 hours: Temp:  [97.8 F (36.6 C)-98.5 F (36.9 C)] 98.5 F (36.9 C) (10/22 2151) Pulse Rate:  [75-81] 75  (10/22 2151) Resp:  [13-20] 20  (10/22 2151) BP: (188-212)/(88-91) 212/91 mmHg (10/22 2151) SpO2:  [97 %-99 %] 99 % (10/22 2151)  General: In bed, appears  asleep CV: Regular rate and rhythm Mental Status: Patient opens eyes and oriented head towards voice He initially does not follow commands, however subsequently he sticks out his tongue to command Cranial Nerves: II: Does not fixate or blink to threat. Pupils are equal, round, and reactive to light.  Discs are difficult to visualize. III,IV, VI: Patient is to awake for doll's eye maneuver, but does not comply with testing V,VII: Facial movement symmetric. Not respond for sensation VIII: hearing is intact to voice X: Unable to assess due to a.m.s XI: Unable to assess due to AMS XII: tongue is midline without atrophy or fasciculations.  Motor: Localizes to pain in both arms, moves all extremities spontaneously.  Sensory: Responds to noxious stimuli x 4.  Deep Tendon Reflexes: 2+ and symmetric in the biceps and patellae.  Cerebellar: Unable to assess 2/2 ams Gait: Unable to assess 2/2 ams  I have reviewed labs in epic and the results pertinent to this consultation are: cbg 151 during second seizure CBC unremarkable. Mild renal insufficiency, mild hyponatremia.   I have reviewed the images obtained:CT head-negative for blood  Impression: [name] with two seizures tonight and one last month. He is afebrile and neck is supple, low concern for infectious cause. His atrophy on CT is suggestive of underlying dementia which can  certainly be a cause of seizure. His elevated BP is concerning and I have asked for a stat MRI to rule out acute CVA vs. Posterior reversible encephalopathy syndrome given the difference in BP management between these two conditions.   Recommendations: 1) MRI brain 2) Load with fosphenytoin 15 PE/kg 3) Following load, would start phenytoin 100mg  TID 4) EEG in the am.    Roland Rack, MD [facility] [phone]  If 7pm- 7am, please page neurology on call at [phone].

## 2012-02-08 NOTE — ED Provider Notes (Signed)
History     CSN: [id number]  Arrival date & time 02/08/12  1903   First MD Initiated Contact with Patient 02/08/12 1906      Chief Complaint  Patient presents with  . Fall    (Consider location/radiation/quality/duration/timing/severity/associated sxs/prior treatment) Patient is a 78 y.o. male presenting with fall. The history is provided by the patient and the EMS personnel.  Fall The accident occurred less than 1 hour ago. The fall occurred in unknown circumstances. He fell from a height of 1 to 2 ft. He landed on a hard floor. There was no blood loss. Point of impact: unknown. The patient is experiencing no pain. He was not ambulatory at the scene. There was no entrapment after the fall. There was no drug use involved in the accident. There was no alcohol use involved in the accident. Associated symptoms include loss of consciousness (Pt does not know why he is in the ED and does not remember what happened before he got here). Pertinent negatives include no visual change, no fever, no abdominal pain, no nausea, no vomiting and no headaches. Treatment on scene includes a c-collar and a backboard. He has tried nothing for the symptoms. The treatment provided no relief.    Past Medical History  Diagnosis Date  . ANEMIA-NOS 02/17/2007  . DEPRESSION 12/25/2008  . HYPERTENSION 02/17/2007  . GERD 02/17/2007  . OSTEOARTHRITIS 03/04/2010  . DIABETES MELLITUS, TYPE I, CONTROLLED, WITH RETINOPATHY   . Gastroparesis   . BENIGN PROSTATIC HYPERTROPHY   . VITAMIN B12 DEFICIENCY   . BACK PAIN, LUMBAR   . LIVER DISORDER   . PSA, INCREASED   . Hyperlipidemia   . Diabetes mellitus   . Pneumonia   . Anxiety   . Encephalopathy, metabolic   . Altered mental status   . CAP (community acquired pneumonia)   . Adult failure to thrive   . Hyponatremia   . CKD (chronic kidney disease)   . Bilateral leg edema     Past Surgical History  Procedure Date  . Transurethral resection of prostate      Family History  Problem Relation Age of Onset  . Cancer Father     had uncertain type of cancer  . Cancer Brother     Prostate Cancer  . Cancer Brother     Prostate Cancer    History  Substance Use Topics  . Smoking status: Never Smoker   . Smokeless tobacco: Never Used  . Alcohol Use: No      Review of Systems  Constitutional: Negative for fever.  Respiratory: Negative for cough and shortness of breath.   Cardiovascular: Negative for chest pain.  Gastrointestinal: Negative for nausea, vomiting, abdominal pain and diarrhea.  Neurological: Positive for loss of consciousness (Pt does not know why he is in the ED and does not remember what happened before he got here). Negative for headaches.  All other systems reviewed and are negative.    Allergies  Percocet  Home Medications   Current Outpatient Rx  Name Route Sig Dispense Refill  . ACETAMINOPHEN 500 MG PO TABS Oral Take 1,000 mg by mouth every 6 (six) hours as needed. For pain.     . ASPIRIN 81 MG PO TABS Oral Take 81 mg by mouth daily.      Marland Kitchen CALCITRIOL 0.25 MCG PO CAPS Oral Take 0.25 mcg by mouth daily.     Marland Kitchen CAPSAICIN 0.025 % EX CREA Topical Apply topically 2 (two) times daily. Apply to both  knees AVOID contact with eyes and broken or irritated skin.    Marland Kitchen VITAMIN D 2000 UNITS PO CAPS Oral Take 2,000 Units by mouth daily.    Marland Kitchen CLONIDINE HCL 0.1 MG PO TABS Oral Take 1 tablet (0.1 mg total) by mouth 2 (two) times daily. 60 tablet 0  . DARBEPOETIN ALFA-POLYSORBATE 100 MCG/0.5ML IJ SOLN Subcutaneous Inject 100 mcg into the skin every 14 (fourteen) days.    . DOCUSATE SODIUM 100 MG PO CAPS Oral Take 100 mg by mouth 2 (two) times daily.    . DONEPEZIL HCL 5 MG PO TABS Oral Take 5 mg by mouth at bedtime.     Marland Kitchen HYDRALAZINE HCL 25 MG PO TABS Oral Take 1 tablet (25 mg total) by mouth every 8 (eight) hours. 90 tablet 0  . INSULIN ASPART 100 UNIT/ML [place] SOLN Subcutaneous Inject 6 Units into the skin 3 (three) times daily  before meals.    . INSULIN GLARGINE 100 UNIT/ML [place] SOLN Subcutaneous Inject 20 Units into the skin every morning.     . INSULIN LISPRO (HUMAN) 100 UNIT/ML [place] SOLN Subcutaneous Inject 2-15 Units into the skin 3 (three) times daily before meals. Sliding scale    . ISOSORBIDE MONONITRATE 10 MG PO TABS Oral Take 1 tablet (10 mg total) by mouth 2 (two) times daily. 180 tablet 1  . LEVALBUTEROL HCL 0.63 MG/3ML IN NEBU Nebulization Take 3 mLs (0.63 mg total) by nebulization every 4 (four) hours as needed for wheezing or shortness of breath.    Marland Kitchen NITROGLYCERIN 0.4 MG SL SUBL Sublingual Place 1 tablet (0.4 mg total) under the tongue every 5 (five) minutes as needed for chest pain. 10 tablet 0  . NON FORMULARY  Mighty Shake  1 by mouth twice daily at 10am and 4pm    . OMEPRAZOLE 20 MG PO CPDR Oral Take 40 mg by mouth daily.    Marland Kitchen POLYETHYLENE GLYCOL 3350 PO PACK Oral Take 17 g by mouth at bedtime.    Marland Kitchen PRAVASTATIN SODIUM 80 MG PO TABS Oral Take 80 mg by mouth daily.    Marland Kitchen PREDNISOLONE ACETATE 1 % OP SUSP Both Eyes Place 1 drop into both eyes daily.    . TRAMADOL HCL 50 MG PO TABS Oral Take 1 tablet (50 mg total) by mouth every 6 (six) hours as needed. 30 tablet 0  . TRIAMTERENE-HCTZ 37.5-25 MG PO TABS Oral Take 1 tablet by mouth daily.     Marland Kitchen VITAMIN B-12 1000 MCG PO TABS Oral Take 1,000 mcg by mouth daily.      BP 188/91  Pulse 78  Temp 97.8 F (36.6 C) (Oral)  Resp 15  SpO2 97%  Physical Exam  Nursing note and vitals reviewed. Constitutional: He is oriented to person, place, and time. He appears well-developed and well-nourished. No distress.  HENT:  Head: Normocephalic and atraumatic.  Eyes: Pupils are equal, round, and reactive to light.  Cardiovascular: Normal rate and normal heart sounds.   Pulmonary/Chest: Effort normal and breath sounds normal. No respiratory distress.  Abdominal: Soft. He exhibits no distension. There is no tenderness.  Musculoskeletal: Normal range of motion.   Neurological: He is alert and oriented to person, place, and time. No cranial nerve deficit. He exhibits normal muscle tone. Coordination normal.  Skin: Skin is warm and dry.  Psychiatric: He has a normal mood and affect.    ED Course  Procedures (including critical care time)  Labs Reviewed  GLUCOSE, CAPILLARY - Abnormal; Notable for  the following:    Glucose-Capillary 151 (*)     All other components within normal limits  CBC WITH DIFFERENTIAL - Abnormal; Notable for the following:    Monocytes Relative 2 (*)     All other components within normal limits  BASIC METABOLIC PANEL - Abnormal; Notable for the following:    Sodium 133 (*)     Glucose, Bld 147 (*)     BUN 26 (*)     Creatinine, Ser 1.48 (*)     GFR calc non Af Amer 41 (*)     GFR calc Af Amer 48 (*)     All other components within normal limits  URINALYSIS, ROUTINE W REFLEX MICROSCOPIC - Abnormal; Notable for the following:    APPearance CLOUDY (*)     Protein, ur 100 (*)     All other components within normal limits  TROPONIN I  URINE MICROSCOPIC-ADD ON   Dg Chest 2 View  02/08/2012  *RADIOLOGY REPORT*  Clinical Data: Fall.  Diabetes.  CHEST - 2 VIEW  Comparison: Multiple exams, including 01/16/2012  Findings: Persistent airspace opacity in the superior segment left lower lobe noted. Low lung volumes are present, causing crowding of the pulmonary vasculature.  Cardiomegaly noted with mild blunting of the left costophrenic angle.  The patient is rotated to the right on today's exam, resulting in reduced diagnostic sensitivity and specificity.   Atherosclerotic calcification of the aortic arch noted.  IMPRESSION:  1.  Airspace opacity in the superior segment left lower lobe, persisting over the past month.  This could be from pneumonia, atelectasis, or a central obstructing lesion/malignancy. The patient does have pleural calcifications bilaterally on today's cervical spine CT, suggesting remote asbestos exposure.  CT OF  THE CHEST (WITH CONTRAST IF FEASIBLE) IS RECOMMENDED FOR FURTHER CHARACTERIZATION. 2.  Cardiomegaly with trace left pleural effusion.   Original Report Authenticated By: Carron Curie, M.D.    Ct Head Wo Contrast  02/08/2012  *RADIOLOGY REPORT*  Clinical Data: The patient stepped and fell.  Likely seizure activity.  Mental status change.  CT HEAD WITHOUT CONTRAST,CT CERVICAL SPINE WITHOUT CONTRAST  Technique:  Contiguous axial images were obtained from the base of the skull through the vertex without contrast.,Technique: Multidetector CT imaging of the cervical spine was performed. Multiplanar CT image reconstructions were also generated.  Comparison: 01/05/2012  Findings: The ventricles are normal in configuration.  There is ventricular enlargement that is somewhat greater than sulcal enlargement, but which is stable most consistent with moderate atrophy.  No convincing hydrocephalus.  There are no parenchymal masses mass effect.  There is minor periventricular white matter hypoattenuation that is most consistent with chronic microvascular ischemic change.  No evidence of a recent infarct.  No extra-axial masses or abnormal fluid collections.  No intracranial hemorrhage.  No skull fracture.  The visualized sinuses and mastoid air cells are clear.  Imaging of the cervical spine shows no fracture or spondylolisthesis.  There are significant degenerative changes with moderate to marked loss of disc height from C3-C4 through C6-C7. There is disc bulging and endplate spurring throughout these levels with milder degrees of facet degenerative change.  There are varying degrees of central stenosis and neural foraminal narrowing throughout the cervical spine. Central stenosis is aggravated at C4- C5 where there is a chronic central disc protrusion with associated calcification. Central stenosis is at least moderate at multiple other levels, greatest at C5-C6 and C6-C7.  The surrounding soft tissues are  unremarkable.  The lung  apices are clear.  IMPRESSION: Head CT:  No acute intracranial abnormalities.  No change from the prior study.  Cervical spine CT:  No fracture or acute finding.  Advanced degenerative changes.   Original Report Authenticated By: Lasandra Beech, M.D.    Ct Cervical Spine Wo Contrast  02/08/2012  *RADIOLOGY REPORT*  Clinical Data: The patient stepped and fell.  Likely seizure activity.  Mental status change.  CT HEAD WITHOUT CONTRAST,CT CERVICAL SPINE WITHOUT CONTRAST  Technique:  Contiguous axial images were obtained from the base of the skull through the vertex without contrast.,Technique: Multidetector CT imaging of the cervical spine was performed. Multiplanar CT image reconstructions were also generated.  Comparison: 01/05/2012  Findings: The ventricles are normal in configuration.  There is ventricular enlargement that is somewhat greater than sulcal enlargement, but which is stable most consistent with moderate atrophy.  No convincing hydrocephalus.  There are no parenchymal masses mass effect.  There is minor periventricular white matter hypoattenuation that is most consistent with chronic microvascular ischemic change.  No evidence of a recent infarct.  No extra-axial masses or abnormal fluid collections.  No intracranial hemorrhage.  No skull fracture.  The visualized sinuses and mastoid air cells are clear.  Imaging of the cervical spine shows no fracture or spondylolisthesis.  There are significant degenerative changes with moderate to marked loss of disc height from C3-C4 through C6-C7. There is disc bulging and endplate spurring throughout these levels with milder degrees of facet degenerative change.  There are varying degrees of central stenosis and neural foraminal narrowing throughout the cervical spine. Central stenosis is aggravated at C4- C5 where there is a chronic central disc protrusion with associated calcification. Central stenosis is at least moderate at  multiple other levels, greatest at C5-C6 and C6-C7.  The surrounding soft tissues are unremarkable.  The lung apices are clear.  IMPRESSION: Head CT:  No acute intracranial abnormalities.  No change from the prior study.  Cervical spine CT:  No fracture or acute finding.  Advanced degenerative changes.   Original Report Authenticated By: Lasandra Beech, M.D.      1. Seizure   2. DM2 (diabetes mellitus, type 2)   3. Lesion of lung   4. Unspecified essential hypertension       MDM  7:22 PM Pt seen and examined. Per EMS report (patient has no idea why he is here) pt was eating in nursing home caf when he suddenly fell backwards and then had shaking activity. Unsure of cause of this episode as patient is not hypoglycemic and per report had not been given glucose at any point. Will work up for seizure v syncope and continue to observe.   Workup unremarkable. Will admit patient to medicine for this episode.   11:43 PM Pt had another T-C seizure (also with eyes rolled back in head and blinking) witnessed by myself and other staff members. It lasted between 2-5 minutes with post-ictal period. Pt has gradually returned to baseline and appears to be following basic commands at this time. Neurology concerned about PRES v stroke so will get stat MRI head. Will not treat hypertension at this time as it is slowing dropping to below [id number] systolic. Pt will be loaded with fosphenytoin and admitted to step down unit after MRI.      Tania Ade, MD 02/08/12 2350

## 2012-02-08 NOTE — ED Provider Notes (Signed)
  I performed a history and physical examination of Eric [name] and discussed his management with Dr. Louie Bun.  I agree with the history, physical, assessment, and plan of care, with the following exceptions: None  This patient who is a limited historian, now presents after an episode of seizure versus syncope.  Given the patient's ED evaluation he had additional seizure activity.  The patient's blood pressure also significantly increased.  In the interval the patient was communicative, moving extremities appropriately, and in no distress.  After the patient's family members arrived, they provide a history of several prior similar episodes, never with a clear diagnosis. Following the patient's seizure, we discussed the case with neurology.  The patient had an emergent MRI to evaluate for stroke versus PRES.  Given the spontaneously improving BP, he did not receive anti-hypertensives.  He was loaded with phosphenytoin.  Cardiac: 81 sr, normal  O2- 99% ra normal   Date: 02/09/2012  Rate: 77  Rhythm: normal sinus rhythm  QRS Axis: left  Intervals: PR prolonged  ST/T Wave abnormalities: nonspecific T wave changes  Conduction Disutrbances:nonspecific intraventricular conduction delay  Narrative Interpretation:   Old EKG Reviewed: changes noted More LVH, ABNORMAL  CRITICAL CARE Performed by: Carmin Muskrat   Total critical care time: 35  Critical care time was exclusive of separately billable procedures and treating other patients.  Critical care was necessary to treat or prevent imminent or life-threatening deterioration.  Critical care was time spent personally by me on the following activities: development of treatment plan with patient and/or surrogate as well as nursing, discussions with consultants, evaluation of patient's response to treatment, examination of patient, obtaining history from patient or surrogate, ordering and performing treatments and interventions, ordering and  review of laboratory studies, ordering and review of radiographic studies, pulse oximetry and re-evaluation of patient's condition.    Oswaldo Done, MD 02/09/12 0001

## 2012-02-08 NOTE — ED Notes (Signed)
Pt. unable to give urine sample. Pt stated he would advise staff when he was able to give same.

## 2012-02-08 NOTE — ED Notes (Signed)
md at bedside for eval.  Ked removed.  Pt  remains in c-collar.

## 2012-02-08 NOTE — ED Notes (Addendum)
Patient from [facility], stood up and fell and family states that he had seizure like activity.  Lost bladder function, mental status changed per staff and became unconscious.  Patient has had convulsions prior but no medication for it.  Patient is now alert and oriented x 3.  Patient is DNR.

## 2012-02-08 NOTE — H&P (Signed)
Eric [name] is an 78 y.o. male.   Patient was seen and examined on February 08, 2012. PCP - Dr. Renato Shin. Chief Complaint: Loss of consciousness. HPI: 78 year-old male was brought from the nursing home after he was witnessed to have a loss of consciousness. In the ER patient was found to have a tonic-clonic seizure. He was given Ativan and a CT head was done which was negative for any acute. Neurologist on call Dr. Leonel Ramsay was consulted at this time patient is receiving fosphenytoin IV and has been admitted for further management. Patient was admitted last month for hypertensive encephalopathy. At that time patient also was found to have a chest x-ray showing features concerning for pneumonia. Today's chest x-ray again shows persistent opacity. Radiologist has recommended further workup on this. Patient otherwise has not complained of any chest pain, shortness of breath nausea vomiting fever chills as history provided by patient's son who was at the bedside.  Past Medical History  Diagnosis Date  . ANEMIA-NOS 02/17/2007  . DEPRESSION 12/25/2008  . HYPERTENSION 02/17/2007  . GERD 02/17/2007  . OSTEOARTHRITIS 03/04/2010  . DIABETES MELLITUS, TYPE I, CONTROLLED, WITH RETINOPATHY   . Gastroparesis   . BENIGN PROSTATIC HYPERTROPHY   . VITAMIN B12 DEFICIENCY   . BACK PAIN, LUMBAR   . LIVER DISORDER   . PSA, INCREASED   . Hyperlipidemia   . Diabetes mellitus   . Pneumonia   . Anxiety   . Encephalopathy, metabolic   . Altered mental status   . CAP (community acquired pneumonia)   . Adult failure to thrive   . Hyponatremia   . CKD (chronic kidney disease)   . Bilateral leg edema     Past Surgical History  Procedure Date  . Transurethral resection of prostate     Family History  Problem Relation Age of Onset  . Cancer Father     had uncertain type of cancer  . Cancer Brother     Prostate Cancer  . Cancer Brother     Prostate Cancer   Social History:  reports that he has  never smoked. He has never used smokeless tobacco. He reports that he does not drink alcohol or use illicit drugs.  Allergies:  Allergies  Allergen Reactions  . Percocet (Oxycodone-Acetaminophen) Rash     (Not in a hospital admission)  Results for orders placed during the hospital encounter of 02/08/12 (from the past 48 hour(s))  GLUCOSE, CAPILLARY     Status: Abnormal   Collection Time   02/08/12  7:14 PM      Component Value Range Comment   Glucose-Capillary 151 (*) 70 - 99 mg/dL   CBC WITH DIFFERENTIAL     Status: Abnormal   Collection Time   02/08/12  7:50 PM      Component Value Range Comment   WBC 6.4  4.0 - 10.5 K/uL    RBC 4.58  4.22 - 5.81 MIL/uL    Hemoglobin 13.8  13.0 - 17.0 g/dL    HCT 41.2  39.0 - 52.0 %    MCV 90.0  78.0 - 100.0 fL    MCH 30.1  26.0 - 34.0 pg    MCHC 33.5  30.0 - 36.0 g/dL    RDW 14.3  11.5 - 15.5 %    Platelets 175  150 - 400 K/uL    Neutrophils Relative 70  43 - 77 %    Neutro Abs 4.5  1.7 - 7.7 K/uL  Lymphocytes Relative 25  12 - 46 %    Lymphs Abs 1.6  0.7 - 4.0 K/uL    Monocytes Relative 2 (*) 3 - 12 %    Monocytes Absolute 0.2  0.1 - 1.0 K/uL    Eosinophils Relative 2  0 - 5 %    Eosinophils Absolute 0.1  0.0 - 0.7 K/uL    Basophils Relative 0  0 - 1 %    Basophils Absolute 0.0  0.0 - 0.1 K/uL   BASIC METABOLIC PANEL     Status: Abnormal   Collection Time   02/08/12  7:50 PM      Component Value Range Comment   Sodium 133 (*) 135 - 145 mEq/L    Potassium 4.1  3.5 - 5.1 mEq/L    Chloride 98  96 - 112 mEq/L    CO2 21  19 - 32 mEq/L    Glucose, Bld 147 (*) 70 - 99 mg/dL    BUN 26 (*) 6 - 23 mg/dL    Creatinine, Ser 1.48 (*) 0.50 - 1.35 mg/dL    Calcium 10.0  8.4 - 10.5 mg/dL    GFR calc non Af Amer 41 (*) >90 mL/min    GFR calc Af Amer 48 (*) >90 mL/min   TROPONIN I     Status: Normal   Collection Time   02/08/12  7:51 PM      Component Value Range Comment   Troponin I <0.30  <0.30 ng/mL   URINALYSIS, ROUTINE W REFLEX  MICROSCOPIC     Status: Abnormal   Collection Time   02/08/12 10:33 PM      Component Value Range Comment   Color, Urine YELLOW  YELLOW    APPearance CLOUDY (*) CLEAR    Specific Gravity, Urine 1.018  1.005 - 1.030    pH 5.5  5.0 - 8.0    Glucose, UA NEGATIVE  NEGATIVE mg/dL    Hgb urine dipstick NEGATIVE  NEGATIVE    Bilirubin Urine NEGATIVE  NEGATIVE    Ketones, ur NEGATIVE  NEGATIVE mg/dL    Protein, ur 100 (*) NEGATIVE mg/dL    Urobilinogen, UA 0.2  0.0 - 1.0 mg/dL    Nitrite NEGATIVE  NEGATIVE    Leukocytes, UA NEGATIVE  NEGATIVE   URINE MICROSCOPIC-ADD ON     Status: Normal   Collection Time   02/08/12 10:33 PM      Component Value Range Comment   Squamous Epithelial / LPF RARE  RARE    WBC, UA 0-2  <3 WBC/hpf    RBC / HPF 0-2  <3 RBC/hpf    Bacteria, UA RARE  RARE    Urine-Other AMORPHOUS URATES/PHOSPHATES      Dg Chest 2 View  02/08/2012  *RADIOLOGY REPORT*  Clinical Data: Fall.  Diabetes.  CHEST - 2 VIEW  Comparison: Multiple exams, including 01/16/2012  Findings: Persistent airspace opacity in the superior segment left lower lobe noted. Low lung volumes are present, causing crowding of the pulmonary vasculature.  Cardiomegaly noted with mild blunting of the left costophrenic angle.  The patient is rotated to the right on today's exam, resulting in reduced diagnostic sensitivity and specificity.   Atherosclerotic calcification of the aortic arch noted.  IMPRESSION:  1.  Airspace opacity in the superior segment left lower lobe, persisting over the past month.  This could be from pneumonia, atelectasis, or a central obstructing lesion/malignancy. The patient does have pleural calcifications bilaterally on today's cervical spine CT, suggesting  remote asbestos exposure.  CT OF THE CHEST (WITH CONTRAST IF FEASIBLE) IS RECOMMENDED FOR FURTHER CHARACTERIZATION. 2.  Cardiomegaly with trace left pleural effusion.   Original Report Authenticated By: Carron Curie, M.D.    Ct Head  Wo Contrast  02/08/2012  *RADIOLOGY REPORT*  Clinical Data: The patient stepped and fell.  Likely seizure activity.  Mental status change.  CT HEAD WITHOUT CONTRAST,CT CERVICAL SPINE WITHOUT CONTRAST  Technique:  Contiguous axial images were obtained from the base of the skull through the vertex without contrast.,Technique: Multidetector CT imaging of the cervical spine was performed. Multiplanar CT image reconstructions were also generated.  Comparison: 01/05/2012  Findings: The ventricles are normal in configuration.  There is ventricular enlargement that is somewhat greater than sulcal enlargement, but which is stable most consistent with moderate atrophy.  No convincing hydrocephalus.  There are no parenchymal masses mass effect.  There is minor periventricular white matter hypoattenuation that is most consistent with chronic microvascular ischemic change.  No evidence of a recent infarct.  No extra-axial masses or abnormal fluid collections.  No intracranial hemorrhage.  No skull fracture.  The visualized sinuses and mastoid air cells are clear.  Imaging of the cervical spine shows no fracture or spondylolisthesis.  There are significant degenerative changes with moderate to marked loss of disc height from C3-C4 through C6-C7. There is disc bulging and endplate spurring throughout these levels with milder degrees of facet degenerative change.  There are varying degrees of central stenosis and neural foraminal narrowing throughout the cervical spine. Central stenosis is aggravated at C4- C5 where there is a chronic central disc protrusion with associated calcification. Central stenosis is at least moderate at multiple other levels, greatest at C5-C6 and C6-C7.  The surrounding soft tissues are unremarkable.  The lung apices are clear.  IMPRESSION: Head CT:  No acute intracranial abnormalities.  No change from the prior study.  Cervical spine CT:  No fracture or acute finding.  Advanced degenerative changes.    Original Report Authenticated By: Lasandra Beech, M.D.    Ct Cervical Spine Wo Contrast  02/08/2012  *RADIOLOGY REPORT*  Clinical Data: The patient stepped and fell.  Likely seizure activity.  Mental status change.  CT HEAD WITHOUT CONTRAST,CT CERVICAL SPINE WITHOUT CONTRAST  Technique:  Contiguous axial images were obtained from the base of the skull through the vertex without contrast.,Technique: Multidetector CT imaging of the cervical spine was performed. Multiplanar CT image reconstructions were also generated.  Comparison: 01/05/2012  Findings: The ventricles are normal in configuration.  There is ventricular enlargement that is somewhat greater than sulcal enlargement, but which is stable most consistent with moderate atrophy.  No convincing hydrocephalus.  There are no parenchymal masses mass effect.  There is minor periventricular white matter hypoattenuation that is most consistent with chronic microvascular ischemic change.  No evidence of a recent infarct.  No extra-axial masses or abnormal fluid collections.  No intracranial hemorrhage.  No skull fracture.  The visualized sinuses and mastoid air cells are clear.  Imaging of the cervical spine shows no fracture or spondylolisthesis.  There are significant degenerative changes with moderate to marked loss of disc height from C3-C4 through C6-C7. There is disc bulging and endplate spurring throughout these levels with milder degrees of facet degenerative change.  There are varying degrees of central stenosis and neural foraminal narrowing throughout the cervical spine. Central stenosis is aggravated at C4- C5 where there is a chronic central disc protrusion with associated calcification. Central stenosis  is at least moderate at multiple other levels, greatest at C5-C6 and C6-C7.  The surrounding soft tissues are unremarkable.  The lung apices are clear.  IMPRESSION: Head CT:  No acute intracranial abnormalities.  No change from the prior study.   Cervical spine CT:  No fracture or acute finding.  Advanced degenerative changes.   Original Report Authenticated By: Lasandra Beech, M.D.     Review of Systems  Constitutional: Negative.   HENT: Negative.   Eyes: Negative.   Respiratory: Negative.   Cardiovascular: Negative.   Gastrointestinal: Negative.   Genitourinary: Negative.   Musculoskeletal: Negative.   Skin: Negative.   Neurological: Positive for seizures and loss of consciousness.  Endo/Heme/Allergies: Negative.   Psychiatric/Behavioral: Negative.     Blood pressure 212/91, pulse 75, temperature 98.5 F (36.9 C), temperature source Oral, resp. rate 20, SpO2 99.00%. Physical Exam  Constitutional: He appears well-developed and well-nourished. No distress.  HENT:  Head: Normocephalic and atraumatic.  Eyes: Conjunctivae normal are normal. Right eye exhibits no discharge. Left eye exhibits no discharge. No scleral icterus.  Neck: Normal range of motion. Neck supple.  Cardiovascular: Normal rate and regular rhythm.   Respiratory: Effort normal and breath sounds normal. No respiratory distress. He has no wheezes. He has no rales.  GI: Soft. Bowel sounds are normal. He exhibits no distension. There is no tenderness. There is no rebound.  Musculoskeletal: He exhibits no tenderness.  Neurological:       Drowsy. Rest of neurological exam is difficult.  Skin: Skin is warm and dry. He is not diaphoretic.     Assessment/Plan #1. Seizures presently postictal - patient will be admitted to step down unit for close observation under seizure precautions. Patient is receiving fosphenytoin and an MRI brain has been ordered by the neurologist which is pending. Patient will be placed on when necessary IV Ativan for any seizure-like activities and further recommendations per neurologist. #2. Abnormal lung lesion chest x-ray - at this time I have ordered CT chest without contrast given his chronic kidney disease for further study his lung  lesion. For now I place patient on Avelox IV possible pneumonia which I doubt. #3. Uncontrolled hypertension - patient's blood pressure is high at this time probably from his stress from seizures. I have placed patient on when necessary IV hydralazine 10 mg every 4 hourly for systolic blood pressure more than 160. #4. Diabetes mellitus type 2 - I have decreased patient's Lantus dose to 10 units. May change to his home dose once patient is alert and awake. Patient is also placed on sliding coverage. #5. Chronic kidney disease - creatinine appears at baseline. Closely follow intake output and metabolic panel.   CODE STATUS - DO NOT RESUSCITATE as discussed with patient's son.  Jahmiya Guidotti N. 02/08/2012, 11:30 PM

## 2012-02-09 ENCOUNTER — Inpatient Hospital Stay (HOSPITAL_COMMUNITY): Payer: Medicare Other

## 2012-02-09 ENCOUNTER — Encounter (HOSPITAL_COMMUNITY): Payer: Self-pay | Admitting: Radiology

## 2012-02-09 DIAGNOSIS — R4182 Altered mental status, unspecified: Secondary | ICD-10-CM

## 2012-02-09 LAB — CBC WITH DIFFERENTIAL/PLATELET
Basophils Relative: 0 % (ref 0–1)
Eosinophils Absolute: 0 10*3/uL (ref 0.0–0.7)
Eosinophils Relative: 1 % (ref 0–5)
Lymphs Abs: 2.9 10*3/uL (ref 0.7–4.0)
MCH: 30.2 pg (ref 26.0–34.0)
MCHC: 34.3 g/dL (ref 30.0–36.0)
MCV: 87.9 fL (ref 78.0–100.0)
Neutrophils Relative %: 58 % (ref 43–77)
Platelets: 201 10*3/uL (ref 150–400)
RBC: 4.64 MIL/uL (ref 4.22–5.81)
RDW: 14.1 % (ref 11.5–15.5)

## 2012-02-09 LAB — COMPREHENSIVE METABOLIC PANEL
Albumin: 2.8 g/dL — ABNORMAL LOW (ref 3.5–5.2)
Alkaline Phosphatase: 103 U/L (ref 39–117)
BUN: 19 mg/dL (ref 6–23)
CO2: 21 mEq/L (ref 19–32)
Chloride: 106 mEq/L (ref 96–112)
Creatinine, Ser: 1.13 mg/dL (ref 0.50–1.35)
GFR calc Af Amer: 66 mL/min — ABNORMAL LOW (ref >90)
GFR calc non Af Amer: 57 mL/min — ABNORMAL LOW (ref >90)
Glucose, Bld: 76 mg/dL (ref 70–99)
Potassium: 4.2 mEq/L (ref 3.5–5.1)
Total Bilirubin: 0.4 mg/dL (ref 0.3–1.2)

## 2012-02-09 LAB — TROPONIN I
Troponin I: <0.3 ng/mL (ref <0.30)
Troponin I: <0.3 ng/mL (ref <0.30)

## 2012-02-09 LAB — GLUCOSE, CAPILLARY: Glucose-Capillary: 116 mg/dL — ABNORMAL HIGH (ref 70–99)

## 2012-02-09 LAB — MRSA PCR SCREENING: MRSA by PCR: NEGATIVE

## 2012-02-09 MED ORDER — HYDRALAZINE HCL 20 MG/ML IJ SOLN
10.0000 mg | INTRAMUSCULAR | Status: DC | PRN
  Filled 2012-02-09: qty 0.5

## 2012-02-09 MED ORDER — INSULIN GLARGINE 100 UNIT/ML ~~LOC~~ SOLN
10.0000 [IU] | Freq: Every day | SUBCUTANEOUS | Status: DC
  Administered 2012-02-09 – 2012-02-10 (×2): 10 [IU] via SUBCUTANEOUS

## 2012-02-09 MED ORDER — LEVALBUTEROL HCL 0.63 MG/3ML IN NEBU
0.6300 mg | INHALATION_SOLUTION | RESPIRATORY_TRACT | Status: DC | PRN
  Filled 2012-02-09: qty 3

## 2012-02-09 MED ORDER — NITROGLYCERIN 0.4 MG SL SUBL: 0.4000 mg | SUBLINGUAL_TABLET | SUBLINGUAL | Status: DC | PRN

## 2012-02-09 MED ORDER — ENSURE PUDDING PO PUDG
1.0000 | Freq: Three times a day (TID) | ORAL | Status: DC
  Administered 2012-02-09 – 2012-02-11 (×7): 1 via ORAL

## 2012-02-09 MED ORDER — PHENYTOIN SODIUM EXTENDED 100 MG PO CAPS
300.0000 mg | ORAL_CAPSULE | Freq: Every day | ORAL | Status: DC
  Administered 2012-02-09 – 2012-02-10 (×2): 300 mg via ORAL
  Filled 2012-02-09 (×3): qty 3

## 2012-02-09 MED ORDER — ONDANSETRON HCL 4 MG/2ML IJ SOLN: 4.0000 mg | Freq: Four times a day (QID) | INTRAMUSCULAR | Status: DC | PRN

## 2012-02-09 MED ORDER — HYDRALAZINE HCL 20 MG/ML IJ SOLN
10.0000 mg | INTRAMUSCULAR | Status: DC | PRN
  Administered 2012-02-09 – 2012-02-10 (×5): 10 mg via INTRAVENOUS
  Administered 2012-02-10 (×2): via INTRAVENOUS
  Administered 2012-02-11: 10 mg via INTRAVENOUS
  Filled 2012-02-09 (×2): qty 0.5

## 2012-02-09 MED ORDER — PANTOPRAZOLE SODIUM 40 MG PO TBEC
40.0000 mg | DELAYED_RELEASE_TABLET | Freq: Every day | ORAL | Status: DC
  Administered 2012-02-09 – 2012-02-11 (×3): 40 mg via ORAL
  Filled 2012-02-09 (×3): qty 1

## 2012-02-09 MED ORDER — DONEPEZIL HCL 10 MG PO TABS
10.0000 mg | ORAL_TABLET | Freq: Every day | ORAL | Status: DC
  Administered 2012-02-09 – 2012-02-10 (×2): 10 mg via ORAL
  Filled 2012-02-09 (×4): qty 1

## 2012-02-09 MED ORDER — ISOSORBIDE MONONITRATE 10 MG PO TABS
10.0000 mg | ORAL_TABLET | Freq: Two times a day (BID) | ORAL | Status: DC
  Administered 2012-02-09 – 2012-02-10 (×3): 10 mg via ORAL
  Administered 2012-02-10: 22:00:00 via ORAL
  Administered 2012-02-11: 10 mg via ORAL
  Filled 2012-02-09 (×7): qty 1

## 2012-02-09 MED ORDER — ONDANSETRON HCL 4 MG PO TABS: 4.0000 mg | ORAL_TABLET | Freq: Four times a day (QID) | ORAL | Status: DC | PRN

## 2012-02-09 MED ORDER — SIMVASTATIN 40 MG PO TABS
40.0000 mg | ORAL_TABLET | Freq: Every day | ORAL | Status: DC
  Administered 2012-02-09 – 2012-02-10 (×2): 40 mg via ORAL
  Filled 2012-02-09 (×3): qty 1

## 2012-02-09 MED ORDER — CALCITRIOL 0.25 MCG PO CAPS
0.2500 ug | ORAL_CAPSULE | Freq: Every day | ORAL | Status: DC
  Administered 2012-02-09 – 2012-02-11 (×3): 0.25 ug via ORAL
  Filled 2012-02-09 (×3): qty 1

## 2012-02-09 MED ORDER — LORAZEPAM 2 MG/ML IJ SOLN: 1.0000 mg | INTRAMUSCULAR | Status: DC | PRN

## 2012-02-09 MED ORDER — PREDNISOLONE ACETATE 1 % OP SUSP
1.0000 [drp] | Freq: Every day | OPHTHALMIC | Status: DC
  Administered 2012-02-09 – 2012-02-11 (×3): 1 [drp] via OPHTHALMIC
  Filled 2012-02-09: qty 1

## 2012-02-09 MED ORDER — INSULIN ASPART 100 UNIT/ML ~~LOC~~ SOLN
0.0000 [IU] | Freq: Three times a day (TID) | SUBCUTANEOUS | Status: DC
  Administered 2012-02-09: 2 [IU] via SUBCUTANEOUS
  Administered 2012-02-10: 16:00:00 via SUBCUTANEOUS
  Administered 2012-02-10 (×2): 2 [IU] via SUBCUTANEOUS
  Administered 2012-02-10 – 2012-02-11 (×3): 5 [IU] via SUBCUTANEOUS

## 2012-02-09 MED ORDER — HYDRALAZINE HCL 25 MG PO TABS
25.0000 mg | ORAL_TABLET | Freq: Three times a day (TID) | ORAL | Status: DC
  Administered 2012-02-09 – 2012-02-11 (×7): 25 mg via ORAL
  Filled 2012-02-09 (×11): qty 1

## 2012-02-09 MED ORDER — SODIUM CHLORIDE 0.9 % IJ SOLN
3.0000 mL | Freq: Two times a day (BID) | INTRAMUSCULAR | Status: DC
  Administered 2012-02-09 – 2012-02-11 (×6): 3 mL via INTRAVENOUS

## 2012-02-09 NOTE — Progress Notes (Signed)
CRITICAL VALUE ALERT  Critical value received: CT Chest result  Date of notification:02/09/12  Time of notification:0924  Critical value read back:yes  Nurse who received alert:  Lillia Corporal RN  MD notified (1st page):  Ms. Erin Hearing NP  Time of first page:  09:44  MD notified (2nd page):  Time of second page:  Responding MD:  Ms Erin Hearing NP  Time MD responded:  Results seen by NP

## 2012-02-09 NOTE — Progress Notes (Signed)
Clinical Social Work Department BRIEF PSYCHOSOCIAL ASSESSMENT 02/09/2012  Patient:  AUBRA, MCKEONE     Account Number:  [id number]     Admit date:  02/08/2012  Clinical Social Worker:  Earlie Server  Date/Time:  02/09/2012 02:00 PM  Referred by:  Physician  Date Referred:  02/09/2012 Referred for  SNF Placement   Other Referral:   Interview type:  Patient Other interview type:   Sister present during assessment    PSYCHOSOCIAL DATA Living Status:  FACILITY Admitted from facility:  [facility] Level of care:  [place] Primary support name:  Vicente Males Primary support relationship to patient:  [place] Degree of support available:   Strong    CURRENT CONCERNS Current Concerns  Post-Acute Placement   Other Concerns:    SOCIAL WORK ASSESSMENT / PLAN CSW received referral due to patient being admitted from a facility. CSW reviewed chart and met with sister and patient at bedside. Patient agreeable to sister involvement.    CSW introduced myself and explained role. Patient is one of 12 brothers and sisters and reports that this sister Vicente Males) assists as needed. Patient is widowed but has children that also assist. Sister reports that son takes care of most of patient's needs and reports he will visit later. Patient was at [facility] and has been there since middle of September. Patient reports that he was at home and then went to SNF for rehab. At dc of SNF, patient went to ALF [facility]) but was only there for a week or two before admitting back to SNF. Patient reports he is unsure of plans at dc. Patient is concerned that insurance will not cover SNF for much longer and sister reported that family is looking into placement at [facility] as well. CSW explained that CSW will continue to follow to assist with needs.    CSW spoke with SNF who reports that patient was at facility and was recently moved to a long term bed.  SNF reports that patient was going to pay privately because son was concerned about patient returning to ALF. SNF agreeable to call CSW if son places a bed hold.    CSW completed FL2 and placed in shadow chart. CSW will continue to follow to determine disposition.   Assessment/plan status:  Psychosocial Support/Ongoing Assessment of Needs Other assessment/ plan:   Information/referral to community resources:   Return to SNF?  Possible new ALF placement    PATIENT'S/FAMILY'S RESPONSE TO PLAN OF CARE: Patient alert and agreeable to CSW consult. Patient and sister engaged throughout assessment and agreeable to discuss plans with CSW. At this time, patient is unsure of plans and does not want to make any decisions until he knows plan at hospital.

## 2012-02-09 NOTE — Plan of Care (Signed)
Problem: Phase I Progression Outcomes Goal: Other Phase I Outcomes/Goals Pt drowsy throughout shift. Alert X name and place. [place] visitors. No seizure activity noted. Pt exhibits hand tremors when eating. See flow sheet for glycemic management.

## 2012-02-09 NOTE — Progress Notes (Signed)
Subjective: Patient awake and alert.  Aware that he was admitted due to a seizure.  On Dilantin.  No side effects noted.  No further seizures noted.    Objective: Current vital signs: BP 157/65  Pulse 80  Temp 97.6 F (36.4 C) (Axillary)  Resp 16  Ht 6\' 1"  (1.854 m)  Wt 68 kg (149 lb 14.6 oz)  BMI 19.78 kg/m2  SpO2 100% Vital signs in last 24 hours: Temp:  [97.6 F (36.4 C)-98.7 F (37.1 C)] 97.6 F (36.4 C) (10/23 0749) Pulse Rate:  [64-99] 80  (10/23 0749) Resp:  [11-20] 16  (10/23 0749) BP: (152-212)/(65-107) 157/65 mmHg (10/23 0749) SpO2:  [96 %-100 %] 100 % (10/23 0749) Weight:  [68 kg (149 lb 14.6 oz)] 68 kg (149 lb 14.6 oz) (10/23 0050)  Intake/Output from previous day: 10/22 0701 - 10/23 0700 In: 250 [IV Piggyback:250] Out: 700 [Urine:700] Intake/Output this shift: Total I/O In: 240 [P.O.:240] Out: -  Nutritional status: Carb Control  Neurologic Exam: Mental Status: Alert, oriented, thought content appropriate.  Speech fluent without evidence of aphasia.  Able to follow 3 step commands without difficulty. Cranial Nerves: II: Discs flat bilaterally; Visual fields grossly normal, pupils equal, round, reactive to light and accommodation III,IV, VI: ptosis not present, extra-ocular motions intact bilaterally V,VII: smile symmetric, facial light touch sensation normal bilaterally VIII: hearing normal bilaterally IX,X: gag reflex present XI: bilateral shoulder shrug XII: midline tongue extension Motor: Right : Upper extremity   5/5       Left:     Upper extremity   5/5  Lower extremity   Does not lift off bed     Lower extremity   5/5 Tone and bulk:normal tone throughout; no atrophy noted. Right lower extremity edematous. Sensory: Pinprick and light touch intact throughout, bilaterally Plantars: Right: equivocal   Left: equivocal CV: pulses palpable throughout     Lab Results: Basic Metabolic Panel:  Lab [id number] 0700 02/08/12 1950  NA 139 133*  K 4.2  4.1  CL 106 98  CO2 21 21  GLUCOSE 76 147*  BUN 19 26*  CREATININE 1.13 1.48*  CALCIUM 9.7 10.0  MG -- --  PHOS -- --    Liver Function Tests:  Lab 02/09/12 0700  AST 24  ALT 16  ALKPHOS 103  BILITOT 0.4  PROT 7.7  ALBUMIN 2.8*   No results found for this basename: LIPASE:5,AMYLASE:5 in the last 168 hours No results found for this basename: AMMONIA:3 in the last 168 hours  CBC:  Lab 02/09/12 0700 02/08/12 1950  WBC 7.5 6.4  NEUTROABS 4.3 4.5  HGB 14.0 13.8  HCT 40.8 41.2  MCV 87.9 90.0  PLT 201 175    Cardiac Enzymes:  Lab 02/09/12 0700 02/09/12 0131 02/08/12 1951  CKTOTAL -- -- --  CKMB -- -- --  CKMBINDEX -- -- --  TROPONINI <0.30 <0.30 <0.30    Lipid Panel: No results found for this basename: CHOL:5,TRIG:5,HDL:5,CHOLHDL:5,VLDL:5,LDLCALC:5 in the last 168 hours  CBG:  Lab 02/09/12 1004 02/09/12 0853 02/09/12 0516 02/09/12 0115 02/08/12 1914  GLUCAP 122* 62* 85 116* 151*    Microbiology: Results for orders placed during the hospital encounter of 02/08/12  MRSA PCR SCREENING     Status: Normal   Collection Time   02/09/12  1:00 AM      Component Value Range Status Comment   MRSA by PCR NEGATIVE  NEGATIVE Final     Coagulation Studies: No results found for this basename:  LABPROT:5,INR:5 in the last 72 hours  Imaging: Dg Chest 2 View  02/08/2012  *RADIOLOGY REPORT*  Clinical Data: Fall.  Diabetes.  CHEST - 2 VIEW  Comparison: Multiple exams, including 01/16/2012  Findings: Persistent airspace opacity in the superior segment left lower lobe noted. Low lung volumes are present, causing crowding of the pulmonary vasculature.  Cardiomegaly noted with mild blunting of the left costophrenic angle.  The patient is rotated to the right on today's exam, resulting in reduced diagnostic sensitivity and specificity.   Atherosclerotic calcification of the aortic arch noted.  IMPRESSION:  1.  Airspace opacity in the superior segment left lower lobe, persisting over  the past month.  This could be from pneumonia, atelectasis, or a central obstructing lesion/malignancy. The patient does have pleural calcifications bilaterally on today's cervical spine CT, suggesting remote asbestos exposure.  CT OF THE CHEST (WITH CONTRAST IF FEASIBLE) IS RECOMMENDED FOR FURTHER CHARACTERIZATION. 2.  Cardiomegaly with trace left pleural effusion.   Original Report Authenticated By: Carron Curie, M.D.    Ct Head Wo Contrast  02/08/2012  *RADIOLOGY REPORT*  Clinical Data: The patient stepped and fell.  Likely seizure activity.  Mental status change.  CT HEAD WITHOUT CONTRAST,CT CERVICAL SPINE WITHOUT CONTRAST  Technique:  Contiguous axial images were obtained from the base of the skull through the vertex without contrast.,Technique: Multidetector CT imaging of the cervical spine was performed. Multiplanar CT image reconstructions were also generated.  Comparison: 01/05/2012  Findings: The ventricles are normal in configuration.  There is ventricular enlargement that is somewhat greater than sulcal enlargement, but which is stable most consistent with moderate atrophy.  No convincing hydrocephalus.  There are no parenchymal masses mass effect.  There is minor periventricular white matter hypoattenuation that is most consistent with chronic microvascular ischemic change.  No evidence of a recent infarct.  No extra-axial masses or abnormal fluid collections.  No intracranial hemorrhage.  No skull fracture.  The visualized sinuses and mastoid air cells are clear.  Imaging of the cervical spine shows no fracture or spondylolisthesis.  There are significant degenerative changes with moderate to marked loss of disc height from C3-C4 through C6-C7. There is disc bulging and endplate spurring throughout these levels with milder degrees of facet degenerative change.  There are varying degrees of central stenosis and neural foraminal narrowing throughout the cervical spine. Central stenosis is  aggravated at C4- C5 where there is a chronic central disc protrusion with associated calcification. Central stenosis is at least moderate at multiple other levels, greatest at C5-C6 and C6-C7.  The surrounding soft tissues are unremarkable.  The lung apices are clear.  IMPRESSION: Head CT:  No acute intracranial abnormalities.  No change from the prior study.  Cervical spine CT:  No fracture or acute finding.  Advanced degenerative changes.   Original Report Authenticated By: Lasandra Beech, M.D.    Ct Chest Wo Contrast  02/09/2012  *RADIOLOGY REPORT*  Clinical Data: Abnormal chest radiograph.  Possible lung mass.  CT CHEST WITHOUT CONTRAST  Technique:  Multidetector CT imaging of the chest was performed following the standard protocol without IV contrast.  Comparison: Chest radiograph 01/05/2012, 04/29/2011 and 04/19/2011.CT abdomen pelvis 10/03/2011.  Findings: No pathologically enlarged mediastinal or axillary lymph nodes.  There are unenlarged partially calcified mediastinal and hilar lymph nodes.  Hilar regions are difficult to definitively evaluate without IV contrast.  Heart is mildly enlarged.  No pericardial effusion.  Mass-like consolidation and associated volume loss are seen in the superior  segment left lower lobe, with obstruction of the superior segmental bronchus.  Tiny left pleural effusion.  Nodular airspace consolidation in the right lower lobe measures approximately 1.3 cm (image 54).  Surrounding airspace disease and mild volume loss in the right lower lobe. Findings appear new from 10/03/2011. Additional scattered tiny pulmonary nodules measure less than 4 mm in size.  No right pleural fluid.  There are calcified pleural plaques bilaterally.  Airway is otherwise unremarkable.  Incidental imaging of the upper abdomen shows sub centimeter low attenuation lesions in the liver, too small to characterize. Thickening of the visualized portion of the right adrenal gland. No worrisome lytic or  sclerotic lesions.  IMPRESSION:  1.  Mass-like consolidation and associated volume loss in the superior segment left lower lobe, with obstruction of the superior segmental bronchus.  Findings are highly worrisome for primary bronchogenic carcinoma. Consultation with the [facility] [phone]) should be considered. These results will be called to the ordering clinician or representative by the Radiologist Assistant, and communication documented in the PACS Dashboard. 2.  Nodular airspace consolidation in the right lower lobe is likely new from 10/03/2011, favoring an infectious etiology. Continued attention on follow-up exams is warranted. 3.  Tiny left pleural effusion. 4.  Asbestos related pleural disease.   Original Report Authenticated By: Luretha Rued, M.D.    Ct Cervical Spine Wo Contrast  02/08/2012  *RADIOLOGY REPORT*  Clinical Data: The patient stepped and fell.  Likely seizure activity.  Mental status change.  CT HEAD WITHOUT CONTRAST,CT CERVICAL SPINE WITHOUT CONTRAST  Technique:  Contiguous axial images were obtained from the base of the skull through the vertex without contrast.,Technique: Multidetector CT imaging of the cervical spine was performed. Multiplanar CT image reconstructions were also generated.  Comparison: 01/05/2012  Findings: The ventricles are normal in configuration.  There is ventricular enlargement that is somewhat greater than sulcal enlargement, but which is stable most consistent with moderate atrophy.  No convincing hydrocephalus.  There are no parenchymal masses mass effect.  There is minor periventricular white matter hypoattenuation that is most consistent with chronic microvascular ischemic change.  No evidence of a recent infarct.  No extra-axial masses or abnormal fluid collections.  No intracranial hemorrhage.  No skull fracture.  The visualized sinuses and mastoid air cells are clear.  Imaging of the cervical spine shows no  fracture or spondylolisthesis.  There are significant degenerative changes with moderate to marked loss of disc height from C3-C4 through C6-C7. There is disc bulging and endplate spurring throughout these levels with milder degrees of facet degenerative change.  There are varying degrees of central stenosis and neural foraminal narrowing throughout the cervical spine. Central stenosis is aggravated at C4- C5 where there is a chronic central disc protrusion with associated calcification. Central stenosis is at least moderate at multiple other levels, greatest at C5-C6 and C6-C7.  The surrounding soft tissues are unremarkable.  The lung apices are clear.  IMPRESSION: Head CT:  No acute intracranial abnormalities.  No change from the prior study.  Cervical spine CT:  No fracture or acute finding.  Advanced degenerative changes.   Original Report Authenticated By: Lasandra Beech, M.D.    Mr Brain Wo Contrast  02/09/2012  *RADIOLOGY REPORT*  Clinical Data: 78 year old male with loss of consciousness, seizure.  Recent hypertensive encephalopathy.  MRI HEAD WITHOUT CONTRAST  Technique:  Multiplanar, multiecho pulse sequences of the brain and surrounding structures were obtained according to standard protocol without intravenous contrast.  Comparison: Head CT without contrast 02/08/2012.  Brain MRI 10/17/2011.  Findings: Stable cerebral volume. Stable ventricle size and configuration.  No ventriculomegaly. No restricted diffusion to suggest acute infarction.  No midline shift, mass effect, evidence of mass lesion, extra-axial collection or acute intracranial hemorrhage.  Cervicomedullary junction and pituitary are within normal limits.  Major intracranial vascular flow voids are grossly stable; there is intermittent motion artifact despite repeated imaging attempts.  Stable gray and white matter signal throughout the brain.  No areas of white matter or cortical edema.  Stable visualized cervical spine.  Normal bone  marrow signal.  Bilateral mastoid effusions have virtually resolved. Paranasal sinuses are clear.  Stable orbit with postoperative changes to the left globe.  Negative scalp soft tissues.  IMPRESSION: 1. No acute intracranial abnormality; stable MRI appearance of the brain since June. 2.  Intermittently degraded by motion despite repeated imaging attempts.   Original Report Authenticated By: Randall An, M.D.     Medications:  I have reviewed the patient's current medications. Scheduled:   . calcitRIOL  0.25 mcg Oral Daily  . donepezil  10 mg Oral QHS  . fosPHENYtoin (CEREBYX) IV  1,000 mg PE Intravenous Once  . hydrALAZINE  25 mg Oral Q8H  . insulin aspart  0-9 Units Subcutaneous TID WC  . insulin glargine  10 Units Subcutaneous QHS  . isosorbide mononitrate  10 mg Oral BID  . LORazepam  1 mg Intravenous Once  . moxifloxacin  400 mg Intravenous Q24H  . pantoprazole  40 mg Oral Daily  . prednisoLONE acetate  1 drop Both Eyes Daily  . simvastatin  40 mg Oral q1800  . sodium chloride  1,000 mL Intravenous Once  . sodium chloride  3 mL Intravenous Q12H    Assessment/Plan:  Patient Active Hospital Problem List: Seizure (02/08/2012)   Assessment: No further seizures noted since the load of Dilantin.  Tolerating well.  CT and MRI of the brain show no focal or acute abnormalities.   Plan:  1. Dilantin level  2. Start Dilantin po at 300mg  daily.   LOS: 1 day   Alexis Goodell, MD [facility] [phone] 02/09/2012  12:14 PM

## 2012-02-09 NOTE — Progress Notes (Signed)
INITIAL ADULT NUTRITION ASSESSMENT Date: 02/09/2012   Time: 1:58 PM  Reason for Assessment: Low Braden  INTERVENTION: 1. Ensure Pudding po TID, each supplement provides 170 kcal and 4 grams of protein.  2. RD to continue to follow nutrition careplan  DOCUMENTATION CODES Per approved criteria  -Not Applicable   ASSESSMENT: Male 78 y.o.  Dx: Seizure  Hx:  Past Medical History  Diagnosis Date  . ANEMIA-NOS 02/17/2007  . DEPRESSION 12/25/2008  . HYPERTENSION 02/17/2007  . GERD 02/17/2007  . OSTEOARTHRITIS 03/04/2010  . DIABETES MELLITUS, TYPE I, CONTROLLED, WITH RETINOPATHY   . Gastroparesis   . BENIGN PROSTATIC HYPERTROPHY   . VITAMIN B12 DEFICIENCY   . BACK PAIN, LUMBAR   . LIVER DISORDER   . PSA, INCREASED   . Hyperlipidemia   . Diabetes mellitus   . Pneumonia   . Anxiety   . Encephalopathy, metabolic   . Altered mental status   . CAP (community acquired pneumonia)   . Adult failure to thrive   . Hyponatremia   . CKD (chronic kidney disease)   . Bilateral leg edema    Past Surgical History  Procedure Date  . Transurethral resection of prostate    Scheduled Meds:   . calcitRIOL  0.25 mcg Oral Daily  . donepezil  10 mg Oral QHS  . fosPHENYtoin (CEREBYX) IV  1,000 mg PE Intravenous Once  . hydrALAZINE  25 mg Oral Q8H  . insulin aspart  0-9 Units Subcutaneous TID WC  . insulin glargine  10 Units Subcutaneous QHS  . isosorbide mononitrate  10 mg Oral BID  . LORazepam  1 mg Intravenous Once  . moxifloxacin  400 mg Intravenous Q24H  . pantoprazole  40 mg Oral Daily  . phenytoin  300 mg Oral Daily  . prednisoLONE acetate  1 drop Both Eyes Daily  . simvastatin  40 mg Oral q1800  . sodium chloride  1,000 mL Intravenous Once  . sodium chloride  3 mL Intravenous Q12H   Continuous Infusions:  PRN Meds:.hydrALAZINE, levalbuterol, LORazepam, nitroGLYCERIN, ondansetron (ZOFRAN) IV, ondansetron, DISCONTD: hydrALAZINE, DISCONTD: hydrALAZINE  Ht: 6\' 1"  (185.4  cm)  Wt: 149 lb 14.6 oz (68 kg)  Ideal Wt: 184 lb/83.6 kg % Ideal Wt: 81%  Wt Readings from Last 15 Encounters:  02/09/12 149 lb 14.6 oz (68 kg)  01/16/12 160 lb (72.576 kg)  01/10/12 153 lb (69.4 kg)  01/04/12 144 lb (65.318 kg)  10/28/11 136 lb 0.4 oz (61.7 kg)  10/01/11 158 lb (71.668 kg)  07/29/11 158 lb 1.9 oz (71.723 kg)  06/11/11 157 lb 3.2 oz (71.305 kg)  06/01/11 155 lb (70.308 kg)  04/29/11 153 lb 12.8 oz (69.763 kg)  04/24/11 155 lb 5.6 oz (70.465 kg)  04/24/11 155 lb 5.6 oz (70.465 kg)  04/16/11 155 lb (70.308 kg)  03/30/11 154 lb 6.4 oz (70.035 kg)  12/30/10 149 lb 9.6 oz (67.858 kg)  Usual Wt: 150 - 160 lb % Usual Wt: 100%  Body mass index is 19.78 kg/(m^2). Weight is WNL.  Food/Nutrition Related Hx: from nursing home  Labs:  CMP     Component Value Date/Time   NA 139 02/09/2012 0700   K 4.2 02/09/2012 0700   CL 106 02/09/2012 0700   CO2 21 02/09/2012 0700   GLUCOSE 76 02/09/2012 0700   BUN 19 02/09/2012 0700   CREATININE 1.13 02/09/2012 0700   CALCIUM 9.7 02/09/2012 0700   CALCIUM 8.8 04/29/2011 1005   PROT 7.7 02/09/2012 0700  ALBUMIN 2.8* 02/09/2012 0700   AST 24 02/09/2012 0700   ALT 16 02/09/2012 0700   ALKPHOS 103 02/09/2012 0700   BILITOT 0.4 02/09/2012 0700   GFRNONAA 57* 02/09/2012 0700   GFRAA 66* 02/09/2012 0700   Lab Results  Component Value Date   HGBA1C 10.6* 10/20/2011   CBG (last 3)   Basename 02/09/12 1258 02/09/12 1004 02/09/12 0853  GLUCAP 168* 122* 62*    Intake/Output Summary (Last 24 hours) at 02/09/12 1401 Last data filed at 02/09/12 0800  Gross per 24 hour  Intake    490 ml  Output    700 ml  Net   -210 ml   Diet Order: Carb Control Medium (1600 - 2000)  Supplements/Tube Feeding: none  IVF:    Estimated Nutritional Needs:   Kcal: 1600 - 1800 kcal Protein:  70 - 80 grams Fluid:  1.8 - 2 liters daily  Admitted from nursing home s/p witnessed loss of consciousness. Work-up reveals tonic-clonic seizure.  Per neurology note, CT and MRI of the brain show no focal or acute abnormalities.  Pt is currently on a Carbohydrate Modified Medium diet (1600 - 2000 calories.) Pt is eating well at this time, >75% of meals. States that his appetite was well PTA.   Stage II pressure ulcer to buttocks and stage I pressure ulcer to shin. Pt is at nutrition risk given advanced age, chronic medical issues, and skin breakdown.  Last BM PTA.  NUTRITION DIAGNOSIS: Increased nutrient needs r/t wound healing AEB estimated needs.  MONITORING/EVALUATION(Goals): Goal: Pt to meet >/= 90% of their estimated nutrition needs Monitor: weight trends, lab trends, I/O's, PO intake, supplement tolerance  EDUCATION NEEDS: -No education needs identified at this time  Inda Coke MS, RD, LDN Pager: [phone] After-hours pager: [phone]

## 2012-02-09 NOTE — Progress Notes (Signed)
[facility] Progress Note [place] TEAM 1 - Stepdown/ICU TEAM   Eric [name] [id number] DOB: July 29, 1923 DOA: 02/08/2012 PCP: Renato Shin, MD  Brief narrative: 78 year-old male SNF resident was brought from the nursing home after he was witnessed to have a loss of consciousness. In the ER patient was found to have a tonic-clonic seizure. He was given Ativan and a CT head was done which was negative for any acute. Neurologist on call Dr. Leonel Ramsay was consulted at this time patient is receiving fosphenytoin IV and has been admitted for further management. Patient was admitted last month for hypertensive encephalopathy. At that time patient also was found to have a chest x-ray showing features concerning for pneumonia. Today's chest x-ray again shows persistent opacity. Radiologist has recommended further workup on this. Patient otherwise has not complained of any chest pain, shortness of breath nausea vomiting fever chills as history provided by patient's son who was at the bedside.   Of note, last month he had a similar episode but his blood glucose was found to be in the low 60s and it was attributed to hypoglycemia.  Assessment/Plan:  Tonic-clonic seizure w/ AMS Neurology is following - the patient appears to be well-controlled on antiepileptic medications - medical therapy is being titrated to oral form at this time - CT scan and MRI of the head are without evidence of acute abnormality  Superior segment LLL Lung mass w/ obstruction of superior segmental bronchus I discussed this finding at length with the patient's son/power of attorney - I explained to him that we cannot be certain but that this lesion does have the appearance of a primary bronchogenic carcinoma - I offered options to include (a) choosing not to pursue this lesion further, (b) reevaluating the lesion in 2-3 months with a repeat CT scan understanding that it could be cancer and that this approach could lead to  further progression, or (c) consulting pulmonary to consider bronchoscopy for confirmatory diagnosis - at this time the patient's power of attorney and I have agreed to consider the options and to discuss this further in 24 hours  Nodular airspace consolidation in the right lower lobe  Though it is recognized this could be an infiltrate/pneumonia the patient is not febrile and has a normal white blood cell count - I will therefore discontinue antibiotics and follow the patient clinically  Asbestos related pleural disease The patient's power of attorney confirms to me that the patient worked in the [employer] - certainly one could expect the potential for significant asbestos exposure during the 60s and 70s within this occupational field - this is not a previously known diagnosis at this time is not a confirmed diagnosis but is simply based upon the results/appearance of his chest CT  Uncontrolled HTN Pressure is now much better controlled - follow today without change  DM CBG is reasonably well-controlled at the present time - no changes today  Vit B12 deficiency B12 level was confirmed to be 1600 in July - further testing at this time is not indicated  Chronic kidney disease Stage III - GFR 30-59 creatinine appears at baseline  Code Status: DNR Family Communication: I spoke at length with the patient's power of attorney Tomie China via phone Disposition Plan: Remain in step down unit  Consultants: Neurology  Procedures: 10/23 - MRI Brain - no acute abnormality 10/23 - CT chest - LLL region lung mass, RLL consolidation, asbestos pleural disease  Antibiotics: Avelox 10/23>>10/24  DVT prophylaxis: SCDs  HPI/Subjective: The patient is alert and conversant.  He can tell me where he is and why he is here.  He denies headache shortness of breath cough nausea or vomiting.  He reports that he has not ever been a smoker.   Objective: Blood pressure 157/65, pulse 80,  temperature 97.6 F (36.4 C), temperature source Axillary, resp. rate 16, height 6\' 1"  (1.854 m), weight 68 kg (149 lb 14.6 oz), SpO2 100.00%.  Intake/Output Summary (Last 24 hours) at 02/09/12 1131 Last data filed at 02/09/12 0800  Gross per 24 hour  Intake    490 ml  Output    700 ml  Net   -210 ml     Exam: General: No acute respiratory distress at rest Lungs: Clear to auscultation bilaterally without wheezes or crackles Cardiovascular: Regular rate and rhythm without murmur gallop or rub normal S1 and S2 Abdomen: Nontender, nondistended, soft, bowel sounds positive, no rebound, no ascites, no appreciable mass Extremities: No significant cyanosis, clubbing, or edema bilateral lower extremities  Data Reviewed: Basic Metabolic Panel:  Lab [id number] 0700 02/08/12 1950  NA 139 133*  K 4.2 4.1  CL 106 98  CO2 21 21  GLUCOSE 76 147*  BUN 19 26*  CREATININE 1.13 1.48*  CALCIUM 9.7 10.0  MG -- --  PHOS -- --   Liver Function Tests:  Lab 02/09/12 0700  AST 24  ALT 16  ALKPHOS 103  BILITOT 0.4  PROT 7.7  ALBUMIN 2.8*   CBC:  Lab 02/09/12 0700 02/08/12 1950  WBC 7.5 6.4  NEUTROABS 4.3 4.5  HGB 14.0 13.8  HCT 40.8 41.2  MCV 87.9 90.0  PLT 201 175   Cardiac Enzymes:  Lab 02/09/12 0700 02/09/12 0131 02/08/12 1951  CKTOTAL -- -- --  CKMB -- -- --  CKMBINDEX -- -- --  TROPONINI <0.30 <0.30 <0.30   BNP (last 3 results)  Basename 01/05/12 2123 10/02/11 1938 10/01/11 1036  PROBNP 581.9* 396.1 1962.0*   CBG:  Lab 02/09/12 1004 02/09/12 0853 02/09/12 0516 02/09/12 0115 02/08/12 1914  GLUCAP 122* 62* 85 116* 151*    Recent Results (from the past 240 hour(s))  MRSA PCR SCREENING     Status: Normal   Collection Time   02/09/12  1:00 AM      Component Value Range Status Comment   MRSA by PCR NEGATIVE  NEGATIVE Final      Studies:  Recent x-ray studies have been reviewed in detail by the Attending Physician  Scheduled Meds:  Reviewed in detail by the  Attending Physician   Cherene Altes, MD [facility] Office  [phone] Pager [phone]  On-Call/Text Page:      [email or web address]      password [email or web address]  If 7PM-7AM, please contact night-coverage [email or web address] Password [email or web address] 02/09/2012, 11:31 AM   LOS: 1 day

## 2012-02-09 NOTE — Progress Notes (Signed)
Clinical Social Work  CSW spoke with [facility] who reported that family expects for patient to return to facility at [facility] and son has placed a bed hold with SNF. CSW will continue to follow.   [place], [place] [phone]

## 2012-02-09 NOTE — Progress Notes (Signed)
CRITICAL VALUE ALERT  Critical value received:  CBG 65  Date of notification:  02/09/12  Time of notification:  08:00  Critical value read back:yes  Nurse who received alert:  Lillia Corporal RN  MD notified (1st page):  Dr. Thereasa Solo  Time of first page:  10:09  MD notified (2nd page):n/a  Time of second page:n/a  Responding MD:  Dr. Thereasa Solo  Time MD responded:  n/a

## 2012-02-10 DIAGNOSIS — G9341 Metabolic encephalopathy: Secondary | ICD-10-CM

## 2012-02-10 LAB — GLUCOSE, CAPILLARY
Glucose-Capillary: 160 mg/dL — ABNORMAL HIGH (ref 70–99)
Glucose-Capillary: 271 mg/dL — ABNORMAL HIGH (ref 70–99)
Glucose-Capillary: 216 mg/dL — ABNORMAL HIGH (ref 70–99)
Glucose-Capillary: 261 mg/dL — ABNORMAL HIGH (ref 70–99)

## 2012-02-10 MED ORDER — TRIAMTERENE-HCTZ 37.5-25 MG PO TABS
1.0000 | ORAL_TABLET | Freq: Every day | ORAL | Status: DC
  Administered 2012-02-10 – 2012-02-11 (×2): 1 via ORAL
  Filled 2012-02-10 (×2): qty 1

## 2012-02-10 NOTE — Progress Notes (Signed)
[facility] Progress Note [place] TEAM 1 - Stepdown/ICU TEAM   Finas Olave Kemler [id number] DOB: [birth year] DOA: 02/08/2012 PCP: Renato Shin, MD  Brief narrative: 78 year-old male SNF resident was brought from the nursing home after he was witnessed to have a loss of consciousness. In the ER patient was found to have a tonic-clonic seizure. He was given Ativan and a CT head was done which was negative for any acute. Neurologist on call Dr. Leonel Ramsay was consulted at this time patient is receiving fosphenytoin IV and has been admitted for further management. Patient was admitted last month for hypertensive encephalopathy. At that time patient also was found to have a chest x-ray showing features concerning for pneumonia. Today's chest x-ray again shows persistent opacity. Radiologist has recommended further workup on this. Patient otherwise has not complained of any chest pain, shortness of breath nausea vomiting fever chills as history provided by patient's son who was at the bedside.   Of note, last month he had a similar episode but his blood glucose was found to be in the low 60s and it was attributed to hypoglycemia.  Assessment/Plan:  Tonic-clonic seizure w/ AMS Neurology is following - the patient appears to be well controlled on Po Dilantin - CT scan and MRI of the head are without evidence of acute abnormality  Superior segment LLL Lung mass w/ obstruction of superior segmental bronchus I discussed this finding at length with the patient's son/power of attorney - I explained to him that we cannot be certain but that this lesion does have the appearance of a primary bronchogenic carcinoma - I offered options to include (a) choosing not to pursue this lesion further, (b) reevaluating the lesion in 2-3 months with a repeat CT scan understanding that it could be cancer and that this approach could lead to further progression, or (c) consulting pulmonary to consider bronchoscopy  for confirmatory diagnosis - I have spoken with the patient's power of attorney - he is yet to decide on what the next step will be.   Nodular airspace consolidation in the right lower lobe  Though it is recognized this could be an infiltrate/pneumonia the patient is not febrile and has a normal white blood cell count - I will therefore discontinue antibiotics and follow the patient clinically  Asbestos related pleural disease The patient's power of attorney confirms to me that the patient worked in the [employer] - certainly one could expect the potential for significant asbestos exposure during the 60s and 70s within this occupational field - this is not a previously known diagnosis at this time is not a confirmed diagnosis but is simply based upon the results/appearance of his chest CT  Uncontrolled HTN Pressure is now much better controlled - follow today without change  DM CBG is reasonably well-controlled at the present time - no changes today  Vit B12 deficiency B12 level was confirmed to be 1600 in July - further testing at this time is not indicated  Chronic kidney disease Stage III - GFR 30-59 creatinine appears at baseline  Code Status: DNR Family Communication: I spoke at length with the patient's power of attorney Tomie China via phone- have notified him the Mr Dority will most likely discharge tomorrow.  Disposition Plan: remain in SDU  Consultants: Neurology  Procedures: 10/23 - MRI Brain - no acute abnormality 10/23 - CT chest - LLL region lung mass, RLL consolidation, asbestos pleural disease  Antibiotics: Avelox 10/23>>10/24  DVT prophylaxis: SCDs  HPI/Subjective: The  patient is alert and conversant.  He can tell me where he is and why he is here.  He denies headache shortness of breath cough nausea or vomiting.  He reports that he has not ever been a smoker.   Objective: Blood pressure 165/76, pulse 106, temperature 97.7 F (36.5 C),  temperature source Oral, resp. rate 15, height 6\' 1"  (1.854 m), weight 70.3 kg (154 lb 15.7 oz), SpO2 100.00%.  Intake/Output Summary (Last 24 hours) at 02/10/12 1515 Last data filed at 02/10/12 0900  Gross per 24 hour  Intake    683 ml  Output    700 ml  Net    -17 ml     Exam: General: confused, No acute respiratory distress at rest Lungs: Clear to auscultation bilaterally without wheezes or crackles Cardiovascular: Regular rate and rhythm without murmur gallop or rub normal S1 and S2 Abdomen: Nontender, nondistended, soft, bowel sounds positive, no rebound, no ascites, no appreciable mass Extremities: No significant cyanosis, clubbing, or edema bilateral lower extremities  Data Reviewed: Basic Metabolic Panel:  Lab [id number] 0700 02/08/12 1950  NA 139 133*  K 4.2 4.1  CL 106 98  CO2 21 21  GLUCOSE 76 147*  BUN 19 26*  CREATININE 1.13 1.48*  CALCIUM 9.7 10.0  MG -- --  PHOS -- --   Liver Function Tests:  Lab 02/09/12 0700  AST 24  ALT 16  ALKPHOS 103  BILITOT 0.4  PROT 7.7  ALBUMIN 2.8*   CBC:  Lab 02/09/12 0700 02/08/12 1950  WBC 7.5 6.4  NEUTROABS 4.3 4.5  HGB 14.0 13.8  HCT 40.8 41.2  MCV 87.9 90.0  PLT 201 175   Cardiac Enzymes:  Lab 02/09/12 1305 02/09/12 0700 02/09/12 0131 02/08/12 1951  CKTOTAL -- -- -- --  CKMB -- -- -- --  CKMBINDEX -- -- -- --  TROPONINI <0.30 <0.30 <0.30 <0.30   BNP (last 3 results)  Basename 01/05/12 2123 10/02/11 1938 10/01/11 1036  PROBNP 581.9* 396.1 1962.0*   CBG:  Lab 02/10/12 1225 02/10/12 0823 02/09/12 2147 02/09/12 1713 02/09/12 1258  GLUCAP 271* 160* 172* 141* 168*    Recent Results (from the past 240 hour(s))  MRSA PCR SCREENING     Status: Normal   Collection Time   02/09/12  1:00 AM      Component Value Range Status Comment   MRSA by PCR NEGATIVE  NEGATIVE Final      Studies:  Recent x-ray studies have been reviewed in detail by the Attending Physician  Scheduled Meds:  Reviewed in detail  by the Attending Physician   Debbe Odea, MD [phone]  If 7PM-7AM, please contact night-coverage [email or web address] Password [email or web address] 02/10/2012, 3:14 PM   LOS: 2 days

## 2012-02-10 NOTE — Progress Notes (Addendum)
[facility] PROGRESS [facility]    SUBJECTIVE   Patient sitting up and eating breakfast.  He has no complaints.  No further seizure activity over night.   Tolerating Dilantin.  OBJECTIVE   Vital signs in last 24 hours: Temp:  [97.4 F (36.3 C)-98.7 F (37.1 C)] 97.4 F (36.3 C) (10/24 0826) Pulse Rate:  [80-122] 110  (10/24 0826) Resp:  [12-20] 20  (10/24 0826) BP: (141-208)/(64-96) 147/64 mmHg (10/24 0826) SpO2:  [96 %-100 %] 96 % (10/24 0826) Weight:  [70.3 kg (154 lb 15.7 oz)] 70.3 kg (154 lb 15.7 oz) (10/24 0400)  Intake/Output from previous day: 10/23 0701 - 10/24 0700 In: 923 [P.O.:920; I.V.:3] Out: 700 [Urine:700] Intake/Output this shift:   Nutritional status: Carb Control  Past Medical History  Diagnosis Date  . ANEMIA-NOS 02/17/2007  . DEPRESSION 12/25/2008  . HYPERTENSION 02/17/2007  . GERD 02/17/2007  . OSTEOARTHRITIS 03/04/2010  . DIABETES MELLITUS, TYPE I, CONTROLLED, WITH RETINOPATHY   . Gastroparesis   . BENIGN PROSTATIC HYPERTROPHY   . VITAMIN B12 DEFICIENCY   . BACK PAIN, LUMBAR   . LIVER DISORDER   . PSA, INCREASED   . Hyperlipidemia   . Diabetes mellitus   . Pneumonia   . Anxiety   . Encephalopathy, metabolic   . Altered mental status   . CAP (community acquired pneumonia)   . Adult failure to thrive   . Hyponatremia   . CKD (chronic kidney disease)   . Bilateral leg edema     Neurologic ROS negative with exception of above. Musculoskeletal ROS right knee discomfort  Neurologic Exam:  Mental Status:  Alert, oriented, thought content appropriate. Speech fluent without evidence of aphasia. Able to follow 3 step commands without difficulty.  Cranial Nerves:  II: Discs flat bilaterally; Visual fields grossly normal, pupils equal, round, reactive to light and accommodation  III,IV, VI: ptosis not present, extra-ocular motions intact bilaterally  V,VII: smile symmetric, facial light touch  sensation normal bilaterally  VIII: hearing normal bilaterally  IX,X: gag reflex present  XI: bilateral shoulder shrug  XII: midline tongue extension  Motor:  Right : Upper extremity 5/5        Left: Upper extremity 5/5   Lower extremity 5/5      Lower extremity 5/5  Tone and bulk:normal tone throughout; no atrophy noted. Right lower extremity edematous.  Sensory: Pinprick and light touch intact throughout, bilaterally  Plantars:  Right: equivocal   Left: equivocal  CV: pulses palpable throughout   Lab Results: Lab Results  Component Value Date/Time   CHOL 108 04/20/2011  4:40 AM   Lipid Panel No results found for this basename: CHOL,TRIG,HDL,CHOLHDL,VLDL,LDLCALC in the last 72 hours  Studies/Results: Dg Chest 2 View  02/08/2012  *RADIOLOGY REPORT*  Clinical Data: Fall.  Diabetes.  CHEST - 2 VIEW  Comparison: Multiple exams, including 01/16/2012  Findings: Persistent airspace opacity in the superior segment left lower lobe noted. Low lung volumes are present, causing crowding of the pulmonary vasculature.  Cardiomegaly noted with mild blunting of the left costophrenic angle.  The patient is rotated to the right on today's exam, resulting in reduced diagnostic sensitivity and specificity.   Atherosclerotic calcification of the aortic arch noted.  IMPRESSION:  1.  Airspace opacity in the superior segment  left lower lobe, persisting over the past month.  This could be from pneumonia, atelectasis, or a central obstructing lesion/malignancy. The patient does have pleural calcifications bilaterally on today's cervical spine CT, suggesting remote asbestos exposure.  CT OF THE CHEST (WITH CONTRAST IF FEASIBLE) IS RECOMMENDED FOR FURTHER CHARACTERIZATION. 2.  Cardiomegaly with trace left pleural effusion.   Original Report Authenticated By: Carron Curie, M.D.    Ct Head Wo Contrast  02/08/2012  *RADIOLOGY REPORT*  Clinical Data: The patient stepped and fell.  Likely seizure activity.   Mental status change.  CT HEAD WITHOUT CONTRAST,CT CERVICAL SPINE WITHOUT CONTRAST  Technique:  Contiguous axial images were obtained from the base of the skull through the vertex without contrast.,Technique: Multidetector CT imaging of the cervical spine was performed. Multiplanar CT image reconstructions were also generated.  Comparison: 01/05/2012  Findings: The ventricles are normal in configuration.  There is ventricular enlargement that is somewhat greater than sulcal enlargement, but which is stable most consistent with moderate atrophy.  No convincing hydrocephalus.  There are no parenchymal masses mass effect.  There is minor periventricular white matter hypoattenuation that is most consistent with chronic microvascular ischemic change.  No evidence of a recent infarct.  No extra-axial masses or abnormal fluid collections.  No intracranial hemorrhage.  No skull fracture.  The visualized sinuses and mastoid air cells are clear.  Imaging of the cervical spine shows no fracture or spondylolisthesis.  There are significant degenerative changes with moderate to marked loss of disc height from C3-C4 through C6-C7. There is disc bulging and endplate spurring throughout these levels with milder degrees of facet degenerative change.  There are varying degrees of central stenosis and neural foraminal narrowing throughout the cervical spine. Central stenosis is aggravated at C4- C5 where there is a chronic central disc protrusion with associated calcification. Central stenosis is at least moderate at multiple other levels, greatest at C5-C6 and C6-C7.  The surrounding soft tissues are unremarkable.  The lung apices are clear.  IMPRESSION: Head CT:  No acute intracranial abnormalities.  No change from the prior study.  Cervical spine CT:  No fracture or acute finding.  Advanced degenerative changes.   Original Report Authenticated By: Lasandra Beech, M.D.    Ct Chest Wo Contrast  02/09/2012  *RADIOLOGY REPORT*   Clinical Data: Abnormal chest radiograph.  Possible lung mass.  CT CHEST WITHOUT CONTRAST  Technique:  Multidetector CT imaging of the chest was performed following the standard protocol without IV contrast.  Comparison: Chest radiograph 01/05/2012, 04/29/2011 and 04/19/2011.CT abdomen pelvis 10/03/2011.  Findings: No pathologically enlarged mediastinal or axillary lymph nodes.  There are unenlarged partially calcified mediastinal and hilar lymph nodes.  Hilar regions are difficult to definitively evaluate without IV contrast.  Heart is mildly enlarged.  No pericardial effusion.  Mass-like consolidation and associated volume loss are seen in the superior segment left lower lobe, with obstruction of the superior segmental bronchus.  Tiny left pleural effusion.  Nodular airspace consolidation in the right lower lobe measures approximately 1.3 cm (image 54).  Surrounding airspace disease and mild volume loss in the right lower lobe. Findings appear new from 10/03/2011. Additional scattered tiny pulmonary nodules measure less than 4 mm in size.  No right pleural fluid.  There are calcified pleural plaques bilaterally.  Airway is otherwise unremarkable.  Incidental imaging of the upper abdomen shows sub centimeter low attenuation lesions in the liver, too small to characterize. Thickening of the visualized portion of the right adrenal  gland. No worrisome lytic or sclerotic lesions.  IMPRESSION:  1.  Mass-like consolidation and associated volume loss in the superior segment left lower lobe, with obstruction of the superior segmental bronchus.  Findings are highly worrisome for primary bronchogenic carcinoma. Consultation with the [facility] [phone]) should be considered. These results will be called to the ordering clinician or representative by the Radiologist Assistant, and communication documented in the PACS Dashboard. 2.  Nodular airspace consolidation in the right lower lobe  is likely new from 10/03/2011, favoring an infectious etiology. Continued attention on follow-up exams is warranted. 3.  Tiny left pleural effusion. 4.  Asbestos related pleural disease.   Original Report Authenticated By: Luretha Rued, M.D.    Ct Cervical Spine Wo Contrast  02/08/2012  *RADIOLOGY REPORT*  Clinical Data: The patient stepped and fell.  Likely seizure activity.  Mental status change.  CT HEAD WITHOUT CONTRAST,CT CERVICAL SPINE WITHOUT CONTRAST  Technique:  Contiguous axial images were obtained from the base of the skull through the vertex without contrast.,Technique: Multidetector CT imaging of the cervical spine was performed. Multiplanar CT image reconstructions were also generated.  Comparison: 01/05/2012  Findings: The ventricles are normal in configuration.  There is ventricular enlargement that is somewhat greater than sulcal enlargement, but which is stable most consistent with moderate atrophy.  No convincing hydrocephalus.  There are no parenchymal masses mass effect.  There is minor periventricular white matter hypoattenuation that is most consistent with chronic microvascular ischemic change.  No evidence of a recent infarct.  No extra-axial masses or abnormal fluid collections.  No intracranial hemorrhage.  No skull fracture.  The visualized sinuses and mastoid air cells are clear.  Imaging of the cervical spine shows no fracture or spondylolisthesis.  There are significant degenerative changes with moderate to marked loss of disc height from C3-C4 through C6-C7. There is disc bulging and endplate spurring throughout these levels with milder degrees of facet degenerative change.  There are varying degrees of central stenosis and neural foraminal narrowing throughout the cervical spine. Central stenosis is aggravated at C4- C5 where there is a chronic central disc protrusion with associated calcification. Central stenosis is at least moderate at multiple other levels, greatest at  C5-C6 and C6-C7.  The surrounding soft tissues are unremarkable.  The lung apices are clear.  IMPRESSION: Head CT:  No acute intracranial abnormalities.  No change from the prior study.  Cervical spine CT:  No fracture or acute finding.  Advanced degenerative changes.   Original Report Authenticated By: Lasandra Beech, M.D.    Mr Brain Wo Contrast  02/09/2012  *RADIOLOGY REPORT*  Clinical Data: 78 year old male with loss of consciousness, seizure.  Recent hypertensive encephalopathy.  MRI HEAD WITHOUT CONTRAST  Technique:  Multiplanar, multiecho pulse sequences of the brain and surrounding structures were obtained according to standard protocol without intravenous contrast.  Comparison: Head CT without contrast 02/08/2012.  Brain MRI 10/17/2011.  Findings: Stable cerebral volume. Stable ventricle size and configuration.  No ventriculomegaly. No restricted diffusion to suggest acute infarction.  No midline shift, mass effect, evidence of mass lesion, extra-axial collection or acute intracranial hemorrhage.  Cervicomedullary junction and pituitary are within normal limits.  Major intracranial vascular flow voids are grossly stable; there is intermittent motion artifact despite repeated imaging attempts.  Stable gray and white matter signal throughout the brain.  No areas of white matter or cortical edema.  Stable visualized cervical spine.  Normal bone marrow signal.  Bilateral mastoid effusions  have virtually resolved. Paranasal sinuses are clear.  Stable orbit with postoperative changes to the left globe.  Negative scalp soft tissues.  IMPRESSION: 1. No acute intracranial abnormality; stable MRI appearance of the brain since June. 2.  Intermittently degraded by motion despite repeated imaging attempts.   Original Report Authenticated By: Randall An, M.D.     Medications:     Scheduled:   . calcitRIOL  0.25 mcg Oral Daily  . donepezil  10 mg Oral QHS  . feeding supplement  1 Container Oral TID BM    . hydrALAZINE  25 mg Oral Q8H  . insulin aspart  0-9 Units Subcutaneous TID WC  . insulin glargine  10 Units Subcutaneous QHS  . isosorbide mononitrate  10 mg Oral BID  . pantoprazole  40 mg Oral Daily  . phenytoin  300 mg Oral Daily  . prednisoLONE acetate  1 drop Both Eyes Daily  . simvastatin  40 mg Oral q1800  . sodium chloride  3 mL Intravenous Q12H  . DISCONTD: moxifloxacin  400 mg Intravenous Q24H    Assessment/Plan:    Patient Active Hospital Problem List: Seizure (02/08/2012)   Assessment: No further seizures over night.  Corrected dilantin level is 22. No complainants of vertigo or diplopia.   Etta Quill PA-C [facility] [phone]  02/10/2012, 9:06 AM   Patient seen and examined.  Clinical course and management discussed.  Necessary edits performed.  I agree with the above.  Patient will continue Dilantin at 300mg  daily.  Has only had one po diose.  Level to be rechecked in the morning.    Alexis Goodell, MD [facility] [phone]  02/10/2012  11:13 AM

## 2012-02-10 NOTE — Progress Notes (Signed)
Inpatient Diabetes Program Recommendations  AACE/ADA: New Consensus Statement on Inpatient Glycemic Control (2013)  Target Ranges:  Prepandial:   less than 140 mg/dL      Peak postprandial:   less than 180 mg/dL (1-2 hours)      Critically ill patients:  140 - 180 mg/dL   Reason for Visit: Results for AMY, USHER (MRN [id number]) as of 02/10/2012 13:12  Ref. Range 02/09/2012 12:58 02/09/2012 17:13 02/09/2012 21:47 02/10/2012 08:23 02/10/2012 12:25  Glucose-Capillary Latest Range: 70-99 mg/dL 168 (H) 141 (H) 172 (H) 160 (H) 271 (H)   Results for DARRELD, DURRELL (MRN [id number]) as of 02/10/2012 13:12  Ref. Range 10/20/2011 15:43  Hemoglobin A1C Latest Range: <5.7 % 10.6 (H)   Note A1C in July was 10.6%.  Please recheck A1C to determine pre-hospitalization glycemic control.  Also, add Novolog 6 units tid with meals (hold if patient eats less than 50%).  Will follow.

## 2012-02-10 NOTE — Evaluation (Signed)
Physical Therapy Evaluation Patient Details Name: Eric [name] MRN: [id number] DOB: 1923/12/15 Today's Date: 02/10/2012 Time: [id number] PT Time Calculation (min): 20 min  PT Assessment / Plan / Recommendation Clinical Impression  Pt. s/p seizure with decr mobility secondary to decr endurance and decr balance.  Will benefit from PT to address endurance and balance issues.  Needs NHP.      PT Assessment  Patient needs continued PT services    Follow Up Recommendations  Post acute inpatient;Supervision/Assistance - 24 hour    Does the patient have the potential to tolerate intense rehabilitation   No, Recommend SNF  Barriers to Discharge        Equipment Recommendations  None recommended by PT    Recommendations for Other Services     Frequency Min 2X/week    Precautions / Restrictions Precautions Precautions: Fall Restrictions Weight Bearing Restrictions: No   Pertinent Vitals/Pain VSS, No pain      Mobility  Bed Mobility Bed Mobility: Sitting - Scoot to Edge of Bed;Supine to Sit Rolling Right: Not tested (comment) Right Sidelying to Sit: Not tested (comment) Supine to Sit: 4: Min assist;With rails;HOB elevated Sitting - Scoot to Edge of Bed: With rail;4: Min guard Details for Bed Mobility Assistance: incr time and cues for technique; assist for elevation of trunk Transfers Transfers: Sit to Stand;Stand to Sit Sit to Stand: 1: +2 Total assist;With upper extremity assist;From bed Sit to Stand: Patient Percentage: 60% Stand to Sit: 1: +2 Total assist;With upper extremity assist;To bed Stand to Sit: Patient Percentage: 50% Details for Transfer Assistance: cues for hand placement; could not achieve full upright standing even with cues and assist.  Flexed posture and leaning to his right. Plopped back onto bed when he got fatigued. Ambulation/Gait Ambulation/Gait Assistance: Not tested (comment) Stairs: No Wheelchair Mobility Wheelchair Mobility: No            PT Diagnosis: Generalized weakness  PT Problem List: Decreased activity tolerance;Decreased balance;Decreased mobility;Decreased knowledge of use of DME;Decreased safety awareness PT Treatment Interventions: DME instruction;Gait training;Functional mobility training;Therapeutic activities;Therapeutic exercise;Balance training;Patient/family education   PT Goals Acute Rehab PT Goals PT Goal Formulation: With patient Time For Goal Achievement: 02/24/12 Potential to Achieve Goals: Good Pt will go Supine/Side to Sit: with supervision PT Goal: Supine/Side to Sit - Progress: Goal set today Pt will Sit at [facility] of Bed: with supervision;with bilateral upper extremity support PT Goal: Sit at Edge Of Bed - Progress: Goal set today Pt will go Sit to Stand: with min assist;with upper extremity assist PT Goal: Sit to Stand - Progress: Goal set today Pt will Transfer Bed to Chair/Chair to Bed: with min assist PT Transfer Goal: Bed to Chair/Chair to Bed - Progress: Goal set today Pt will Ambulate: 1 - 15 feet;with mod assist;with least restrictive assistive device PT Goal: Ambulate - Progress: Goal set today  Visit Information  Last PT Received On: 02/10/12 Assistance Needed: +2 PT/OT Co-Evaluation/Treatment: Yes    Subjective Data  Subjective: "I need to go take this basket to church." Patient Stated Goal: To go to church (pt confused)   Prior Functioning  Home Living Available Help at Discharge: [place] Type of Home: [facility] and they state PT/OT was seeing patient there and that he ambulated some with therapy   Cognition  Overall Cognitive Status: Impaired Area of Impairment: Attention;Following commands;Memory;Safety/judgement;Awareness of errors;Awareness of deficits;Problem solving Arousal/Alertness: Awake/alert Orientation Level: Disoriented to;Place;Time;Situation Behavior During Session: [facility] for tasks performed Current  Attention Level:  Focused Following Commands: Follows one step commands with increased time;Follows one step commands inconsistently Safety/Judgement: Decreased awareness of safety precautions;Decreased safety judgement for tasks assessed;Decreased awareness of need for assistance Awareness of Errors: Assistance required to identify errors made Problem Solving: poor    Extremity/Trunk Assessment Right Lower Extremity Assessment RLE ROM/Strength/Tone: Deficits RLE ROM/Strength/Tone Deficits: Has strength but has difficulty using it functionally Left Lower Extremity Assessment LLE ROM/Strength/Tone: Deficits LLE ROM/Strength/Tone Deficits: Has strength but has difficulty using it functionally Trunk Assessment Trunk Assessment: Kyphotic   Balance Static Sitting Balance Static Sitting - Balance Support: Feet supported;Bilateral upper extremity supported Static Sitting - Level of Assistance: 4: Min assist Static Sitting - Comment/# of Minutes: 4 minutes leaning right needing assist to right himself. Static Standing Balance Static Standing - Balance Support: Bilateral upper extremity supported;During functional activity Static Standing - Level of Assistance: 1: +2 Total assist (pt = 65%) Static Standing - Comment/# of Minutes: Pt. held bil HHA standing at EOB.  Flexed posture.  Needed a fair amount of assist to maintain balance.  Pt fatigued quickly.    End of Session PT - End of Session Equipment Utilized During Treatment: Gait belt Activity Tolerance: Patient limited by fatigue (limited by confusion) Patient left: in bed;with call bell/phone within reach Nurse Communication: Mobility status       INGOLD,Caelen Reierson 02/10/2012, 11:52 AM [facility] [phone] (pager)

## 2012-02-10 NOTE — Progress Notes (Signed)
Clinical Social Work  No visitors at bedside. CSW called patient's son Arnette Norris) and spoke with him via phone. CSW introduced myself and explained role. Son reports that plan is for patient to return to [facility] at dc. Son has CSW contact information and will contact me with any further questions. CSW will continue to follow to assist with dc planning.  [place], [place] [phone]

## 2012-02-10 NOTE — Evaluation (Signed)
Occupational Therapy Evaluation Patient Details Name: Eric [name] MRN: [id number] DOB: 1923/04/20 Today's Date: 02/10/2012 Time: [id number] OT Time Calculation (min): 20 min  OT Assessment / Plan / Recommendation Clinical Impression  This 78 y.o. male with h/o dementia admitted from SNF for seizures.  Pt. will benefit from acute OT to maximize safety and independence with BADLs to allow pt. to return to highest level of independence possible    OT Assessment  Patient needs continued OT Services    Follow Up Recommendations  Skilled nursing facility    Barriers to Discharge None Pt. for return to SNF  Equipment Recommendations  None recommended by PT;None recommended by OT    Recommendations for Other Services    Frequency  Min 2X/week    Precautions / Restrictions Precautions Precautions: Fall Precaution Comments: Pt. confused - dementia Restrictions Weight Bearing Restrictions: No       ADL  Eating/Feeding: Set up Where Assessed - Eating/Feeding: Bed level Grooming: Minimal assistance (max cues due to distraction) Where Assessed - Grooming: Unsupported sitting Upper Body Bathing: Moderate assistance (for thoroughness) Where Assessed - Upper Body Bathing: Unsupported sitting Lower Body Bathing: Maximal assistance Where Assessed - Lower Body Bathing: Supported sit to stand Upper Body Dressing: Moderate assistance Where Assessed - Upper Body Dressing: Unsupported sitting Lower Body Dressing: Moderate assistance (max cues due to distractedness) Where Assessed - Lower Body Dressing: Supported sit to Lobbyist: +2 Total assistance Toilet Transfer: Patient Percentage: 60% Toilet Transfer Method: Sit to stand;Stand pivot ADL Comments: Pt. able to don/doff socks with supervision and max cues due to pt. self distracts    OT Diagnosis: Generalized weakness;Cognitive deficits  OT Problem List: Decreased strength;Decreased activity tolerance;Impaired balance  (sitting and/or standing);Decreased cognition;Decreased safety awareness;Decreased knowledge of use of DME or AE OT Treatment Interventions: Self-care/ADL training;DME and/or AE instruction;Therapeutic activities;Cognitive remediation/compensation;Patient/family education;Balance training   OT Goals Acute Rehab OT Goals OT Goal Formulation: Patient unable to participate in goal setting Time For Goal Achievement: 02/24/12 Potential to Achieve Goals: Fair ADL Goals Pt Will Perform Grooming: with supervision;Sitting, chair ADL Goal: Grooming - Progress: Goal set today Pt Will Perform Upper Body Bathing: with min assist;Sitting, chair ADL Goal: Upper Body Bathing - Progress: Goal set today Pt Will Perform Lower Body Bathing: with mod assist;Sit to stand from chair;Sit to stand from bed ADL Goal: Lower Body Bathing - Progress: Goal set today Pt Will Perform Lower Body Dressing: with mod assist;Sit to stand from bed;Sit to stand from chair ADL Goal: Lower Body Dressing - Progress: Goal set today Pt Will Transfer to Toilet: with mod assist;Stand pivot transfer;3-in-1  Visit Information  Last OT Received On: 02/10/12 Assistance Needed: +2    Subjective Data  Subjective: "I've got to push that cart to the church" Patient Stated Goal: Pt. unable to state   Prior Functioning     Home Living Available Help at Discharge: [place] Type of Home: [place] Prior Function Level of Independence: Needs assistance Needs Assistance: Bathing;Dressing;Toileting;Gait;Transfers Comments: Spoke with nurse at [facility].  Pt. required assist, but unable to determine amount of assist required Communication Communication: No difficulties         Vision/Perception     Cognition  Overall Cognitive Status: History of cognitive impairments - at baseline Area of Impairment: Attention;Memory;Following commands;Safety/judgement;Awareness of errors;Awareness of  deficits Arousal/Alertness: Awake/alert Orientation Level: Disoriented to;Place;Time;Situation Behavior During Session: [facility] for tasks performed Current Attention Level: Focused Following Commands: Follows one step commands with increased time;Follows  one step commands inconsistently Safety/Judgement: Decreased awareness of safety precautions;Decreased safety judgement for tasks assessed;Decreased awareness of need for assistance Awareness of Errors: Assistance required to identify errors made Problem Solving: poor Cognition - Other Comments: Pt. with history of dementia    Extremity/Trunk Assessment Right Upper Extremity Assessment RUE ROM/Strength/Tone: Within functional levels RUE Coordination: WFL - gross/fine motor Left Upper Extremity Assessment LUE ROM/Strength/Tone: Within functional levels LUE Coordination: WFL - gross/fine motor Right Lower Extremity Assessment RLE ROM/Strength/Tone: Deficits RLE ROM/Strength/Tone Deficits: Has strength but has difficulty using it functionally Left Lower Extremity Assessment LLE ROM/Strength/Tone: Deficits LLE ROM/Strength/Tone Deficits: Has strength but has difficulty using it functionally Trunk Assessment Trunk Assessment: Kyphotic     Mobility Bed Mobility Bed Mobility: Sitting - Scoot to Edge of Bed;Supine to Sit Rolling Right: Not tested (comment) Right Sidelying to Sit: Not tested (comment) Supine to Sit: 4: Min assist;With rails;HOB elevated Sitting - Scoot to Edge of Bed: With rail;4: Min guard Details for Bed Mobility Assistance: incr time and cues for technique; assist for elevation of trunk Transfers Transfers: Sit to Stand;Stand to Sit Sit to Stand: 1: +2 Total assist;With upper extremity assist;From bed Sit to Stand: Patient Percentage: 60% Stand to Sit: 1: +2 Total assist;With upper extremity assist;To bed Stand to Sit: Patient Percentage: 50% Details for Transfer Assistance: cues for hand placement; could not achieve  full upright standing even with cues and assist.  Flexed posture and leaning to his right. Plopped back onto bed when he got fatigued.     Shoulder Instructions     Exercise     Balance Balance Balance Assessed: Yes Static Sitting Balance Static Sitting - Balance Support: Feet supported;Bilateral upper extremity supported Static Sitting - Level of Assistance: 4: Min assist Static Sitting - Comment/# of Minutes: 4 minutes leaning right needing assist to right himself. Static Standing Balance Static Standing - Balance Support: Bilateral upper extremity supported;During functional activity Static Standing - Level of Assistance: 1: +2 Total assist Static Standing - Comment/# of Minutes: Pt. held bil HHA standing at EOB.  Flexed posture.  Needed a fair amount of assist to maintain balance.  Pt fatigued quickly.     End of Session OT - End of Session Equipment Utilized During Treatment: Gait belt Activity Tolerance: Patient tolerated treatment well Patient left: in bed;with call bell/phone within reach;with bed alarm set Nurse Communication: Mobility status  [place], Ellard Artis M 02/10/2012, 2:09 PM

## 2012-02-11 DIAGNOSIS — R569 Unspecified convulsions: Secondary | ICD-10-CM

## 2012-02-11 LAB — GLUCOSE, CAPILLARY: Glucose-Capillary: 276 mg/dL — ABNORMAL HIGH (ref 70–99)

## 2012-02-11 LAB — PHENYTOIN LEVEL, TOTAL: Phenytoin Lvl: 13.8 ug/mL (ref 10.0–20.0)

## 2012-02-11 LAB — ALBUMIN: Albumin: 3 g/dL — ABNORMAL LOW (ref 3.5–5.2)

## 2012-02-11 MED ORDER — PHENYTOIN SODIUM EXTENDED 300 MG PO CAPS: 300.0000 mg | ORAL_CAPSULE | Freq: Every day | ORAL | Status: DC

## 2012-02-11 MED ORDER — LORAZEPAM 2 MG/ML IJ SOLN
0.5000 mg | Freq: Once | INTRAMUSCULAR | Status: AC
  Administered 2012-02-11: 0.5 mg via INTRAVENOUS
  Filled 2012-02-11: qty 1

## 2012-02-11 NOTE — Progress Notes (Signed)
Clinical Social Work  CSW faxed dc summary to [facility] who is ready to admit patient. CSW informed patient, son and RN of dc and all parties agreeable. CSW prepared dc packet with DNR and FL2. CSW coordinated transportation via [place]. CSW is signing off but available if needed.    [place], [place] [phone]

## 2012-02-11 NOTE — Progress Notes (Signed)
Order to transfer pt back to SNF.  Report called to [facility].  Pt informed of plan for transfer.  [facility] here to transfer pt.  Taken via Biomedical scientist by [employer]  with all belongings.

## 2012-02-11 NOTE — Progress Notes (Signed)
NEURO HOSPITALIST PROGRESS NOTE   SUBJECTIVE:                                                                                                                        Patient is very drowsy. Received .5 mg Ativan at 0135 hours due to agitation and elevated BP.  He follows all my commands, remains confused at date and year.  He is also complaining of right shoulder discomfort but states this is not new.  OBJECTIVE:                                                                                                                           Vital signs in last 24 hours: Temp:  [97.4 F (36.3 C)-98.3 F (36.8 C)] 97.6 F (36.4 C) (10/25 0400) Pulse Rate:  [96-123] 102  (10/25 0800) Resp:  [13-21] 13  (10/25 0800) BP: (161-200)/(63-109) 161/63 mmHg (10/25 0800) SpO2:  [95 %-100 %] 97 % (10/25 0800) Weight:  [69.3 kg (152 lb 12.5 oz)] 69.3 kg (152 lb 12.5 oz) (10/25 0400)  Intake/Output from previous day: 10/24 0701 - 10/25 0700 In: 840 [P.O.:840] Out: 1300 [Urine:1300] Intake/Output this shift:   Nutritional status: Carb Control  Past Medical History  Diagnosis Date  . ANEMIA-NOS 02/17/2007  . DEPRESSION 12/25/2008  . HYPERTENSION 02/17/2007  . GERD 02/17/2007  . OSTEOARTHRITIS 03/04/2010  . DIABETES MELLITUS, TYPE I, CONTROLLED, WITH RETINOPATHY   . Gastroparesis   . BENIGN PROSTATIC HYPERTROPHY   . VITAMIN B12 DEFICIENCY   . BACK PAIN, LUMBAR   . LIVER DISORDER   . PSA, INCREASED   . Hyperlipidemia   . Diabetes mellitus   . Pneumonia   . Anxiety   . Encephalopathy, metabolic   . Altered mental status   . CAP (community acquired pneumonia)   . Adult failure to thrive   . Hyponatremia   . CKD (chronic kidney disease)   . Bilateral leg edema     Neurologic ROS negative with exception of above . Musculoskeletal ROS right shoulder discomfort  Neurologic Exam:   Mental Status: drowsy, oriented to hospital but not year or month.  Speech  fluent without evidence of aphasia.  Able to follow 2 step verbal and visual  commands without difficulty. Cranial Nerves: II: Discs flat bilaterally; Visual fields grossly normal, pupils equal, round, reactive to light and accommodation III,IV, VI: ptosis not present, extra-ocular motions intact bilaterally V,VII: smile symmetric, facial light touch sensation normal bilaterally VIII: hearing normal bilaterally IX,X: gag reflex present XI: bilateral shoulder shrug XII: midline tongue extension Motor: Right : Upper extremity   4/5 (shoulder pain)  Left:     Upper extremity   5/5  Lower extremity   5/5      Lower extremity   5/5 Tone and bulk:normal tone throughout; no atrophy noted Sensory: Pinprick and light touch intact throughout, bilaterally Deep Tendon Reflexes: 2+ and symmetric throughout Plantars: Right: equivical   Left: equivical Cerebellar: normal finger-to-nose,   CV: pulses palpable throughout    Lab Results: Lab Results  Component Value Date/Time   CHOL 108 04/20/2011  4:40 AM   Lipid Panel No results found for this basename: CHOL,TRIG,HDL,CHOLHDL,VLDL,LDLCALC in the last 72 hours  Studies/Results: No results found.  MEDICATIONS                                                                                                                        Scheduled:   . calcitRIOL  0.25 mcg Oral Daily  . donepezil  10 mg Oral QHS  . feeding supplement  1 Container Oral TID BM  . hydrALAZINE  25 mg Oral Q8H  . insulin aspart  0-9 Units Subcutaneous TID WC  . insulin glargine  10 Units Subcutaneous QHS  . isosorbide mononitrate  10 mg Oral BID  . LORazepam  0.5 mg Intravenous Once  . pantoprazole  40 mg Oral Daily  . phenytoin  300 mg Oral Daily  . prednisoLONE acetate  1 drop Both Eyes Daily  . simvastatin  40 mg Oral q1800  . sodium chloride  3 mL Intravenous Q12H  . triamterene-hydrochlorothiazide  1 each Oral Q1500    ASSESSMENT/PLAN:                                                                                                                Patient Active Hospital Problem List: Seizure (02/08/2012)   Assessment: No further seizures over night.  Corrected Dilantin level is 19.7 this morning. Patient has been having elevated BP overnight needing PRN hydralazine.     Plan: Continue PO Dilantin 300 mg daily.  Follow up with out patient neurologist.  No further recommendations at this time.   Neurology will S/O  Discussed with Dr. Orvan July  Derek [place] [facility] [phone]  02/11/2012, 8:33 AM

## 2012-02-11 NOTE — Progress Notes (Signed)
Pt very agitated attempting to get out bed.BP 201/89, PRN BP med already given.Md on call made aware.Revieved order for ativan 0.5mg  IV once.same given.I will continue to monitor closely.

## 2012-02-11 NOTE — Discharge Summary (Signed)
Physician Discharge Summary  Megan Yee Marandola [id number] DOB: [birth year] DOA: 02/08/2012  PCP: Renato Shin, MD  Admit date: 02/08/2012 Discharge date: 02/11/2012  Time spent: 45 minutes  Recommendations for Outpatient Follow-up:  1. F/u dilantin levels 2. CT finding noted that may be suggestive of bronchogenic cancer- discussed in detail with pateint's son - he does not want to pursue work up at this time.   Discharge Diagnoses:  Principal Problem:  *Seizure Active Problems:  HYPERTENSION  Lesion of lung  DM2 (diabetes mellitus, type 2)   Discharge Condition: stable  Diet recommendation: heart healthy, diabetic diet  Filed Weights   02/09/12 0050 02/10/12 0400 02/11/12 0400  Weight: 68 kg (149 lb 14.6 oz) 70.3 kg (154 lb 15.7 oz) 69.3 kg (152 lb 12.5 oz)    History of present illness:  78 year-old male SNF resident was brought from the nursing home after he was witnessed to have a loss of consciousness. In the ER patient was found to have a tonic-clonic seizure. He was given Ativan and a CT head was done which was negative for any acute. Neurologist on call Dr. Leonel Ramsay was consulted at this time patient is receiving fosphenytoin IV and has been admitted for further management. Patient was admitted last month for hypertensive encephalopathy. At that time patient also was found to have a chest x-ray showing features of possible pneumonia. Marland Kitchen   Hospital Course:  Tonic-clonic seizure w/ AMS  Neurology has consulted - the patient's seizures now appear to be well controlled on PO Dilantin - CT scan and MRI of the head are without evidence of acute abnormality   Superior segment LLL Lung mass w/ obstruction of superior segmental bronchus  I discussed this finding at length with the patient's son/power of attorney - I explained to him that we cannot be certain but that this lesion does have the appearance of a primary bronchogenic carcinoma - I offered options to include (a)  choosing not to pursue this lesion further, (b) reevaluating the lesion in 2-3 months with a repeat CT scan understanding that it could be cancer and that this approach could lead to further progression, or (c) consulting pulmonary to consider bronchoscopy for confirmatory diagnosis - I have spoken with the patient's son again today and he has decided not to pursue work up for this lesion at this time.   Nodular airspace consolidation in the right lower lobe  Though it is recognized this could be an infiltrate/pneumonia the patient is not febrile and has a normal white blood cell count - We have therefore discontinue antibiotics -  Asbestos related pleural disease?  The patient's power of attorney confirmed that the patient worked in the [employer] - certainly one could expect the potential for significant asbestos exposure during the 60s and 70s within this occupational field - this is not a previously known diagnosis and at this time is not a confirmed diagnosis- it is simply based upon the results of his chest CT   Uncontrolled HTN  F/u as outpt  DM  CBG is reasonably well-controlled at the present time - no changes today   Vit B12 deficiency  B12 level was confirmed to be 1600 in July - further testing at this time is not indicated   Chronic kidney disease Stage III - GFR 30-59  creatinine appears at baseline   Consultations:  Neurology  Discharge Exam: Filed Vitals:   02/11/12 1000 02/11/12 1100 02/11/12 1150 02/11/12 1200  BP: 164/65 166/62 166/67 160/63  Pulse: 103 101 94 101  Temp:   98.3 F (36.8 C)   TempSrc:   Oral   Resp: 14 14 14 14   Height:      Weight:      SpO2: 99% 99% 99% 99%    General: alert, he is confusion in regards to place and recent events, no acute distress Cardiovascular: RRR, no murmurs Respiratory: CTA b/l   Discharge Instructions  Discharge Orders    Future Orders Please Complete By Expires   Diet - low sodium heart healthy       Comments:   Diabetic diet   Increase activity slowly          Medication List     As of 02/11/2012  1:05 PM    TAKE these medications         acetaminophen 500 MG tablet   Commonly known as: TYLENOL   Take 1,000 mg by mouth every 6 (six) hours as needed. For pain.      aspirin 81 MG chewable tablet   Chew 81 mg by mouth daily.      calcitRIOL 0.25 MCG capsule   Commonly known as: ROCALTROL   Take 0.25 mcg by mouth daily.      donepezil 10 MG tablet   Commonly known as: ARICEPT   Take 10 mg by mouth at bedtime.      hydrALAZINE 25 MG tablet   Commonly known as: APRESOLINE   Take 1 tablet (25 mg total) by mouth every 8 (eight) hours.      insulin aspart 100 UNIT/ML injection   Commonly known as: novoLOG   Inject 6 Units into the skin 3 (three) times daily before meals.      insulin glargine 100 UNIT/ML injection   Commonly known as: LANTUS   Inject 35 Units into the skin every morning.      insulin lispro 100 UNIT/ML injection   Commonly known as: HUMALOG   Inject 2-15 Units into the skin 3 (three) times daily before meals. Sliding scale      isosorbide mononitrate 20 MG tablet   Commonly known as: ISMO,MONOKET   Take 10 mg by mouth 2 (two) times daily.      levalbuterol 0.63 MG/3ML nebulizer solution   Commonly known as: XOPENEX   Take 3 mLs (0.63 mg total) by nebulization every 4 (four) hours as needed for wheezing or shortness of breath.      nitroGLYCERIN 0.4 MG SL tablet   Commonly known as: NITROSTAT   Place 1 tablet (0.4 mg total) under the tongue every 5 (five) minutes as needed for chest pain.      omeprazole 20 MG capsule   Commonly known as: PRILOSEC   Take 40 mg by mouth daily.      phenytoin 300 MG ER capsule   Commonly known as: DILANTIN   Take 1 capsule (300 mg total) by mouth daily.      pravastatin 80 MG tablet   Commonly known as: PRAVACHOL   Take 80 mg by mouth daily.      prednisoLONE acetate 1 % ophthalmic suspension   Commonly  known as: PRED FORTE   Place 1 drop into both eyes daily.      traMADol 50 MG tablet   Commonly known as: ULTRAM   Take 50 mg by mouth every 6 (six) hours as needed. For pain      triamterene-hydrochlorothiazide 37.5-25 MG per tablet   Commonly known as: MAXZIDE-25  Take 1 tablet by mouth daily.          The results of significant diagnostics from this hospitalization (including imaging, microbiology, ancillary and laboratory) are listed below for reference.    Significant Diagnostic Studies: Dg Chest 2 View  02/08/2012  *RADIOLOGY REPORT*  Clinical Data: Fall.  Diabetes.  CHEST - 2 VIEW  Comparison: Multiple exams, including 01/16/2012  Findings: Persistent airspace opacity in the superior segment left lower lobe noted. Low lung volumes are present, causing crowding of the pulmonary vasculature.  Cardiomegaly noted with mild blunting of the left costophrenic angle.  The patient is rotated to the right on today's exam, resulting in reduced diagnostic sensitivity and specificity.   Atherosclerotic calcification of the aortic arch noted.  IMPRESSION:  1.  Airspace opacity in the superior segment left lower lobe, persisting over the past month.  This could be from pneumonia, atelectasis, or a central obstructing lesion/malignancy. The patient does have pleural calcifications bilaterally on today's cervical spine CT, suggesting remote asbestos exposure.  CT OF THE CHEST (WITH CONTRAST IF FEASIBLE) IS RECOMMENDED FOR FURTHER CHARACTERIZATION. 2.  Cardiomegaly with trace left pleural effusion.   Original Report Authenticated By: Carron Curie, M.D.    Dg Chest 2 View  01/16/2012  *RADIOLOGY REPORT*  Clinical Data: Altered mental status.  CHEST - 2 VIEW  Comparison: PA and lateral chest 10/03/2011 and portable chest 01/05/2012 and 10/06/2011.  Findings: There is focal airspace disease in the superior segment of the left lower lobe.  Right lung is clear.  There is cardiomegaly.  Small left  pleural effusion is noted.  No pneumothorax.  IMPRESSION:  1.  Focal left lower lobe airspace disease could be due to atelectasis or pneumonia.  Scar is also possible as the patient has had prior left lower lobe pneumonia. 2.  Tiny left pleural effusion. 3.  Cardiomegaly.   Original Report Authenticated By: Arvid Right. Luther Parody, M.D.    Ct Head Wo Contrast  02/08/2012  *RADIOLOGY REPORT*  Clinical Data: The patient stepped and fell.  Likely seizure activity.  Mental status change.  CT HEAD WITHOUT CONTRAST,CT CERVICAL SPINE WITHOUT CONTRAST  Technique:  Contiguous axial images were obtained from the base of the skull through the vertex without contrast.,Technique: Multidetector CT imaging of the cervical spine was performed. Multiplanar CT image reconstructions were also generated.  Comparison: 01/05/2012  Findings: The ventricles are normal in configuration.  There is ventricular enlargement that is somewhat greater than sulcal enlargement, but which is stable most consistent with moderate atrophy.  No convincing hydrocephalus.  There are no parenchymal masses mass effect.  There is minor periventricular white matter hypoattenuation that is most consistent with chronic microvascular ischemic change.  No evidence of a recent infarct.  No extra-axial masses or abnormal fluid collections.  No intracranial hemorrhage.  No skull fracture.  The visualized sinuses and mastoid air cells are clear.  Imaging of the cervical spine shows no fracture or spondylolisthesis.  There are significant degenerative changes with moderate to marked loss of disc height from C3-C4 through C6-C7. There is disc bulging and endplate spurring throughout these levels with milder degrees of facet degenerative change.  There are varying degrees of central stenosis and neural foraminal narrowing throughout the cervical spine. Central stenosis is aggravated at C4- C5 where there is a chronic central disc protrusion with associated calcification.  Central stenosis is at least moderate at multiple other levels, greatest at C5-C6 and C6-C7.  The surrounding soft tissues are unremarkable.  The lung apices are clear.  IMPRESSION: Head CT:  No acute intracranial abnormalities.  No change from the prior study.  Cervical spine CT:  No fracture or acute finding.  Advanced degenerative changes.   Original Report Authenticated By: Lasandra Beech, M.D.    Ct Chest Wo Contrast  02/09/2012  *RADIOLOGY REPORT*  Clinical Data: Abnormal chest radiograph.  Possible lung mass.  CT CHEST WITHOUT CONTRAST  Technique:  Multidetector CT imaging of the chest was performed following the standard protocol without IV contrast.  Comparison: Chest radiograph 01/05/2012, 04/29/2011 and 04/19/2011.CT abdomen pelvis 10/03/2011.  Findings: No pathologically enlarged mediastinal or axillary lymph nodes.  There are unenlarged partially calcified mediastinal and hilar lymph nodes.  Hilar regions are difficult to definitively evaluate without IV contrast.  Heart is mildly enlarged.  No pericardial effusion.  Mass-like consolidation and associated volume loss are seen in the superior segment left lower lobe, with obstruction of the superior segmental bronchus.  Tiny left pleural effusion.  Nodular airspace consolidation in the right lower lobe measures approximately 1.3 cm (image 54).  Surrounding airspace disease and mild volume loss in the right lower lobe. Findings appear new from 10/03/2011. Additional scattered tiny pulmonary nodules measure less than 4 mm in size.  No right pleural fluid.  There are calcified pleural plaques bilaterally.  Airway is otherwise unremarkable.  Incidental imaging of the upper abdomen shows sub centimeter low attenuation lesions in the liver, too small to characterize. Thickening of the visualized portion of the right adrenal gland. No worrisome lytic or sclerotic lesions.  IMPRESSION:  1.  Mass-like consolidation and associated volume loss in the superior  segment left lower lobe, with obstruction of the superior segmental bronchus.  Findings are highly worrisome for primary bronchogenic carcinoma. Consultation with the [facility] [phone]) should be considered. These results will be called to the ordering clinician or representative by the Radiologist Assistant, and communication documented in the PACS Dashboard. 2.  Nodular airspace consolidation in the right lower lobe is likely new from 10/03/2011, favoring an infectious etiology. Continued attention on follow-up exams is warranted. 3.  Tiny left pleural effusion. 4.  Asbestos related pleural disease.   Original Report Authenticated By: Luretha Rued, M.D.    Ct Cervical Spine Wo Contrast  02/08/2012  *RADIOLOGY REPORT*  Clinical Data: The patient stepped and fell.  Likely seizure activity.  Mental status change.  CT HEAD WITHOUT CONTRAST,CT CERVICAL SPINE WITHOUT CONTRAST  Technique:  Contiguous axial images were obtained from the base of the skull through the vertex without contrast.,Technique: Multidetector CT imaging of the cervical spine was performed. Multiplanar CT image reconstructions were also generated.  Comparison: 01/05/2012  Findings: The ventricles are normal in configuration.  There is ventricular enlargement that is somewhat greater than sulcal enlargement, but which is stable most consistent with moderate atrophy.  No convincing hydrocephalus.  There are no parenchymal masses mass effect.  There is minor periventricular white matter hypoattenuation that is most consistent with chronic microvascular ischemic change.  No evidence of a recent infarct.  No extra-axial masses or abnormal fluid collections.  No intracranial hemorrhage.  No skull fracture.  The visualized sinuses and mastoid air cells are clear.  Imaging of the cervical spine shows no fracture or spondylolisthesis.  There are significant degenerative changes with moderate to marked loss of  disc height from C3-C4 through C6-C7. There is disc bulging and endplate spurring throughout these levels with milder degrees of facet degenerative change.  There are varying degrees of central stenosis and neural foraminal narrowing throughout the cervical spine. Central stenosis is aggravated at C4- C5 where there is a chronic central disc protrusion with associated calcification. Central stenosis is at least moderate at multiple other levels, greatest at C5-C6 and C6-C7.  The surrounding soft tissues are unremarkable.  The lung apices are clear.  IMPRESSION: Head CT:  No acute intracranial abnormalities.  No change from the prior study.  Cervical spine CT:  No fracture or acute finding.  Advanced degenerative changes.   Original Report Authenticated By: Lasandra Beech, M.D.    Mr Brain Wo Contrast  02/09/2012  *RADIOLOGY REPORT*  Clinical Data: 78 year old male with loss of consciousness, seizure.  Recent hypertensive encephalopathy.  MRI HEAD WITHOUT CONTRAST  Technique:  Multiplanar, multiecho pulse sequences of the brain and surrounding structures were obtained according to standard protocol without intravenous contrast.  Comparison: Head CT without contrast 02/08/2012.  Brain MRI 10/17/2011.  Findings: Stable cerebral volume. Stable ventricle size and configuration.  No ventriculomegaly. No restricted diffusion to suggest acute infarction.  No midline shift, mass effect, evidence of mass lesion, extra-axial collection or acute intracranial hemorrhage.  Cervicomedullary junction and pituitary are within normal limits.  Major intracranial vascular flow voids are grossly stable; there is intermittent motion artifact despite repeated imaging attempts.  Stable gray and white matter signal throughout the brain.  No areas of white matter or cortical edema.  Stable visualized cervical spine.  Normal bone marrow signal.  Bilateral mastoid effusions have virtually resolved. Paranasal sinuses are clear.  Stable  orbit with postoperative changes to the left globe.  Negative scalp soft tissues.  IMPRESSION: 1. No acute intracranial abnormality; stable MRI appearance of the brain since June. 2.  Intermittently degraded by motion despite repeated imaging attempts.   Original Report Authenticated By: Randall An, M.D.     Microbiology: Recent Results (from the past 240 hour(s))  MRSA PCR SCREENING     Status: Normal   Collection Time   02/09/12  1:00 AM      Component Value Range Status Comment   MRSA by PCR NEGATIVE  NEGATIVE Final      Labs: Basic Metabolic Panel:  Lab [id number] 0700 02/08/12 1950  NA 139 133*  K 4.2 4.1  CL 106 98  CO2 21 21  GLUCOSE 76 147*  BUN 19 26*  CREATININE 1.13 1.48*  CALCIUM 9.7 10.0  MG -- --  PHOS -- --   Liver Function Tests:  Lab 02/11/12 0430 02/09/12 0700  AST -- 24  ALT -- 16  ALKPHOS -- 103  BILITOT -- 0.4  PROT -- 7.7  ALBUMIN 3.0* 2.8*   No results found for this basename: LIPASE:5,AMYLASE:5 in the last 168 hours No results found for this basename: AMMONIA:5 in the last 168 hours CBC:  Lab 02/09/12 0700 02/08/12 1950  WBC 7.5 6.4  NEUTROABS 4.3 4.5  HGB 14.0 13.8  HCT 40.8 41.2  MCV 87.9 90.0  PLT 201 175   Cardiac Enzymes:  Lab 02/09/12 1305 02/09/12 0700 02/09/12 0131 02/08/12 1951  CKTOTAL -- -- -- --  CKMB -- -- -- --  CKMBINDEX -- -- -- --  TROPONINI <0.30 <0.30 <0.30 <0.30   BNP: BNP (last 3 results)  Basename 01/05/12 2123 10/02/11 1938 10/01/11 1036  PROBNP 581.9* 396.1 1962.0*   CBG:  Lab 02/11/12 1149 02/11/12 0820 02/10/12 2151 02/10/12 1635 02/10/12 1225  GLUCAP 260* 276* 261* 216* 271*  SignedDebbe Odea  [facility] 02/11/2012, 1:05 PM

## 2012-03-10 ENCOUNTER — Ambulatory Visit: Payer: Medicare Other | Admitting: Endocrinology

## 2012-03-14 ENCOUNTER — Telehealth: Payer: Self-pay | Admitting: Internal Medicine

## 2012-03-14 NOTE — Telephone Encounter (Signed)
Phone call from Adline Peals RN: She got a report that pt had  no BM x 6 days.No reported N-V-fever-abd pain. H/o multiple medical issues, last creat 1.3 Recommend: MOM tonight (that is the only med available at the facility tonight) Miralax daily starting tomorrow She will visit the patient tomorrow, I asked her to check for fecal impaction. ER if develops abdominal pain, nausea, vomiting or fever Call PCP if not better in a couple of days

## 2012-03-21 ENCOUNTER — Encounter: Payer: Self-pay | Admitting: Endocrinology

## 2012-03-21 DIAGNOSIS — L89309 Pressure ulcer of unspecified buttock, unspecified stage: Secondary | ICD-10-CM

## 2012-03-21 DIAGNOSIS — I10 Essential (primary) hypertension: Secondary | ICD-10-CM

## 2012-03-21 DIAGNOSIS — E119 Type 2 diabetes mellitus without complications: Secondary | ICD-10-CM

## 2012-03-21 DIAGNOSIS — L8991 Pressure ulcer of unspecified site, stage 1: Secondary | ICD-10-CM

## 2012-03-29 ENCOUNTER — Emergency Department (HOSPITAL_COMMUNITY)
Admission: EM | Admit: 2012-03-29 | Discharge: 2012-03-30 | Disposition: A | Discharge status: Home / Self Care | Payer: Medicare Other | Attending: Emergency Medicine | Admitting: Emergency Medicine

## 2012-03-29 DIAGNOSIS — Z8669 Personal history of other diseases of the nervous system and sense organs: Secondary | ICD-10-CM | POA: Insufficient documentation

## 2012-03-29 DIAGNOSIS — Z8639 Personal history of other endocrine, nutritional and metabolic disease: Secondary | ICD-10-CM | POA: Insufficient documentation

## 2012-03-29 DIAGNOSIS — E785 Hyperlipidemia, unspecified: Secondary | ICD-10-CM | POA: Insufficient documentation

## 2012-03-29 DIAGNOSIS — Z792 Long term (current) use of antibiotics: Secondary | ICD-10-CM | POA: Insufficient documentation

## 2012-03-29 DIAGNOSIS — Z79899 Other long term (current) drug therapy: Secondary | ICD-10-CM | POA: Insufficient documentation

## 2012-03-29 DIAGNOSIS — R21 Rash and other nonspecific skin eruption: Secondary | ICD-10-CM | POA: Insufficient documentation

## 2012-03-29 DIAGNOSIS — E1039 Type 1 diabetes mellitus with other diabetic ophthalmic complication: Secondary | ICD-10-CM | POA: Insufficient documentation

## 2012-03-29 DIAGNOSIS — N189 Chronic kidney disease, unspecified: Secondary | ICD-10-CM | POA: Insufficient documentation

## 2012-03-29 DIAGNOSIS — Z8659 Personal history of other mental and behavioral disorders: Secondary | ICD-10-CM | POA: Insufficient documentation

## 2012-03-29 DIAGNOSIS — Z7982 Long term (current) use of aspirin: Secondary | ICD-10-CM | POA: Insufficient documentation

## 2012-03-29 DIAGNOSIS — M1990 Unspecified osteoarthritis, unspecified site: Secondary | ICD-10-CM | POA: Insufficient documentation

## 2012-03-29 DIAGNOSIS — Z87448 Personal history of other diseases of urinary system: Secondary | ICD-10-CM | POA: Insufficient documentation

## 2012-03-29 DIAGNOSIS — Z8701 Personal history of pneumonia (recurrent): Secondary | ICD-10-CM | POA: Insufficient documentation

## 2012-03-29 DIAGNOSIS — Z862 Personal history of diseases of the blood and blood-forming organs and certain disorders involving the immune mechanism: Secondary | ICD-10-CM | POA: Insufficient documentation

## 2012-03-29 DIAGNOSIS — Z8719 Personal history of other diseases of the digestive system: Secondary | ICD-10-CM | POA: Insufficient documentation

## 2012-03-29 DIAGNOSIS — E11319 Type 2 diabetes mellitus with unspecified diabetic retinopathy without macular edema: Secondary | ICD-10-CM | POA: Insufficient documentation

## 2012-03-29 DIAGNOSIS — Z794 Long term (current) use of insulin: Secondary | ICD-10-CM | POA: Insufficient documentation

## 2012-03-29 DIAGNOSIS — I120 Hypertensive chronic kidney disease with stage 5 chronic kidney disease or end stage renal disease: Secondary | ICD-10-CM | POA: Insufficient documentation

## 2012-03-29 DIAGNOSIS — K219 Gastro-esophageal reflux disease without esophagitis: Secondary | ICD-10-CM | POA: Insufficient documentation

## 2012-03-29 MED ORDER — HYDROXYZINE HCL 25 MG PO TABS: 25.0000 mg | ORAL_TABLET | Freq: Four times a day (QID) | ORAL | Status: DC | PRN

## 2012-03-29 MED ORDER — PREDNISONE 10 MG PO TABS: 20.0000 mg | ORAL_TABLET | Freq: Every day | ORAL | Status: DC

## 2012-03-29 MED ORDER — PREDNISONE 20 MG PO TABS
60.0000 mg | ORAL_TABLET | Freq: Once | ORAL | Status: AC
  Administered 2012-03-29: 60 mg via ORAL
  Filled 2012-03-29: qty 3

## 2012-03-29 NOTE — ED Provider Notes (Signed)
History     CSN: [id number]  Arrival date & time 03/29/12  1702   First MD Initiated Contact with Patient 03/29/12 1731      Chief Complaint  Patient presents with  . Rash    (Consider location/radiation/quality/duration/timing/severity/associated sxs/prior treatment) Patient is a 78 y.o. male presenting with rash. The history is provided by the patient.  Rash    patient here with rash on his lower extremities and abdomen x2 months that has been pruritic. No new medications or detergents or lotions. He has used hydrocortisone cream without relief.. Symptoms have been consistent. No fever appreciated. Denies any oral involvement. Nothing makes her symptoms better or worse and no history of same   Past Medical History  Diagnosis Date  . ANEMIA-NOS 02/17/2007  . DEPRESSION 12/25/2008  . HYPERTENSION 02/17/2007  . GERD 02/17/2007  . OSTEOARTHRITIS 03/04/2010  . DIABETES MELLITUS, TYPE I, CONTROLLED, WITH RETINOPATHY   . Gastroparesis   . BENIGN PROSTATIC HYPERTROPHY   . VITAMIN B12 DEFICIENCY   . BACK PAIN, LUMBAR   . LIVER DISORDER   . PSA, INCREASED   . Hyperlipidemia   . Diabetes mellitus   . Pneumonia   . Anxiety   . Encephalopathy, metabolic   . Altered mental status   . CAP (community acquired pneumonia)   . Adult failure to thrive   . Hyponatremia   . CKD (chronic kidney disease)   . Bilateral leg edema     Past Surgical History  Procedure Date  . Transurethral resection of prostate     Family History  Problem Relation Age of Onset  . Cancer Father     had uncertain type of cancer  . Cancer Brother     Prostate Cancer  . Cancer Brother     Prostate Cancer    History  Substance Use Topics  . Smoking status: Never Smoker   . Smokeless tobacco: Never Used  . Alcohol Use: No      Review of Systems  Skin: Positive for rash.  All other systems reviewed and are negative.    Allergies  Percocet  Home Medications   Current Outpatient Rx   Name  Route  Sig  Dispense  Refill  . AMLODIPINE BESYLATE 10 MG PO TABS   Oral   Take 10 mg by mouth daily.         . ASPIRIN 81 MG PO CHEW   Oral   Chew 81 mg by mouth daily.         Marland Kitchen CALCITRIOL 0.25 MCG PO CAPS   Oral   Take 0.25 mcg by mouth daily.          Marland Kitchen DOCUSATE SODIUM 100 MG PO CAPS   Oral   Take 100 mg by mouth daily.         . DONEPEZIL HCL 10 MG PO TABS   Oral   Take 10 mg by mouth at bedtime.         Randall Hiss IMMUNE HEALTH PO LIQD   Oral   Take 237 mLs by mouth 2 (two) times daily.         Marland Kitchen HYDRALAZINE HCL 25 MG PO TABS   Oral   Take 1 tablet (25 mg total) by mouth every 8 (eight) hours.   90 tablet   0   . INSULIN GLARGINE 100 UNIT/ML [place] SOLN   Subcutaneous   Inject 40 Units into the skin at bedtime.         Marland Kitchen  INSULIN LISPRO (HUMAN) 100 UNIT/ML [place] SOLN   Subcutaneous   Inject 2-15 Units into the skin 4 (four) times daily -  before meals and at bedtime. Per Sliding scale 0-120=0units...121-150=2units.Marland KitchenMarland Kitchen151-200=3units.Marland KitchenMarland Kitchen201-250=5units.Marland KitchenMarland Kitchen251-300=8units.Marland KitchenMarland Kitchen301-350=11units.Marland KitchenMarland Kitchen351-400=15units..>400=call    MD         . ISOSORBIDE MONONITRATE 10 MG PO TABS   Oral   Take 10 mg by mouth 2 (two) times daily.         Marland Kitchen OMEPRAZOLE 40 MG PO CPDR   Oral   Take 40 mg by mouth daily.         Marland Kitchen PHENYTOIN SODIUM EXTENDED 100 MG PO CAPS   Oral   Take 300 mg by mouth at bedtime.         Marland Kitchen PRAVASTATIN SODIUM 80 MG PO TABS   Oral   Take 80 mg by mouth daily.         Marland Kitchen PREDNISOLONE ACETATE 1 % OP SUSP   Both Eyes   Place 1 drop into both eyes daily.         . SENNA 8.6 MG PO TABS   Oral   Take 1 tablet by mouth daily.         . SULFAMETHOXAZOLE-TMP DS 800-160 MG PO TABS   Oral   Take 1 tablet by mouth 2 (two) times daily.         . TRIAMTERENE-HCTZ 37.5-25 MG PO TABS   Oral   Take 1 tablet by mouth daily.          Marland Kitchen VITAMIN D (ERGOCALCIFEROL) 50000 UNITS PO CAPS   Oral   Take 50,000 Units by mouth every 7 (seven)  days.           BP 189/83  Pulse 88  Temp 98.5 F (36.9 C) (Oral)  Resp 16  SpO2 99%  Physical Exam  Nursing note and vitals reviewed. Constitutional: He is oriented to person, place, and time. He appears well-developed and well-nourished.  Non-toxic appearance. No distress.  HENT:  Head: Normocephalic and atraumatic.  Eyes: Conjunctivae normal, EOM and lids are normal. Pupils are equal, round, and reactive to light.  Neck: Normal range of motion. Neck supple. No tracheal deviation present. No mass present.  Cardiovascular: Normal rate, regular rhythm and normal heart sounds.  Exam reveals no gallop.   No murmur heard. Pulmonary/Chest: Effort normal and breath sounds normal. No stridor. No respiratory distress. He has no decreased breath sounds. He has no wheezes. He has no rhonchi. He has no rales.  Abdominal: Soft. Normal appearance and bowel sounds are normal. He exhibits no distension. There is no tenderness. There is no rebound and no CVA tenderness.  Musculoskeletal: Normal range of motion. He exhibits no edema and no tenderness.  Neurological: He is alert and oriented to person, place, and time. He has normal strength. No cranial nerve deficit or sensory deficit. GCS eye subscore is 4. GCS verbal subscore is 5. GCS motor subscore is 6.  Skin: Skin is warm and dry. Rash noted. No abrasion noted. Rash is macular and papular.       Diffuse macular papular rash on the thighs of both legs. Some on his anterior abdominal wall. No oral mucosal involvement  Psychiatric: He has a normal mood and affect. His speech is normal and behavior is normal.    ED Course  Procedures (including critical care time)  Labs Reviewed - No data to display No results found.   No diagnosis found.    MDM   patient  with likely dermatitis. I gave him a dose of prednisone here and will place him on a taper and he will followup with Dr. as needed        Leota Jacobsen, MD 03/29/12 [phone]

## 2012-03-29 NOTE — ED Notes (Signed)
[facility] called to take pt back to [facility]

## 2012-03-29 NOTE — ED Notes (Signed)
Pt BIB EMS. Pt has red raised bumps on extremities and torso and back for the past month. EMS concerned that pt has bedbugs. Pt a/o x 3. Pt lives at [facility]. Pt has been using hydorocortisone cream with little relief. Pt with no acute distress.

## 2012-03-29 NOTE — ED Notes (Signed)
[id number] Expected date:<BR> Expected time: 4:51 PM<BR> Means of arrival:Ambulance<BR> Comments:<BR> HTN

## 2012-03-30 NOTE — Clinical Social Work Note (Signed)
CSW confirmed that pt is from [place].  The facility can be reached at [phone] for report.  The address to the facility is [address] in [place].  CSW is available for assistance if needed.  Nonnie Done, [place] [phone]  Clinical Social Work

## 2012-06-12 ENCOUNTER — Encounter (HOSPITAL_COMMUNITY): Payer: Self-pay

## 2012-06-12 ENCOUNTER — Emergency Department (HOSPITAL_COMMUNITY)
Admission: EM | Admit: 2012-06-12 | Discharge: 2012-06-12 | Disposition: A | Discharge status: Skilled Nursing Facility | Payer: Medicare Other | Attending: Emergency Medicine | Admitting: Emergency Medicine

## 2012-06-12 DIAGNOSIS — K219 Gastro-esophageal reflux disease without esophagitis: Secondary | ICD-10-CM | POA: Insufficient documentation

## 2012-06-12 DIAGNOSIS — I129 Hypertensive chronic kidney disease with stage 1 through stage 4 chronic kidney disease, or unspecified chronic kidney disease: Secondary | ICD-10-CM | POA: Insufficient documentation

## 2012-06-12 DIAGNOSIS — N189 Chronic kidney disease, unspecified: Secondary | ICD-10-CM | POA: Insufficient documentation

## 2012-06-12 DIAGNOSIS — E785 Hyperlipidemia, unspecified: Secondary | ICD-10-CM | POA: Insufficient documentation

## 2012-06-12 DIAGNOSIS — Z862 Personal history of diseases of the blood and blood-forming organs and certain disorders involving the immune mechanism: Secondary | ICD-10-CM | POA: Insufficient documentation

## 2012-06-12 DIAGNOSIS — Z8701 Personal history of pneumonia (recurrent): Secondary | ICD-10-CM | POA: Insufficient documentation

## 2012-06-12 DIAGNOSIS — E1049 Type 1 diabetes mellitus with other diabetic neurological complication: Secondary | ICD-10-CM | POA: Insufficient documentation

## 2012-06-12 DIAGNOSIS — E119 Type 2 diabetes mellitus without complications: Secondary | ICD-10-CM

## 2012-06-12 DIAGNOSIS — E1069 Type 1 diabetes mellitus with other specified complication: Secondary | ICD-10-CM | POA: Insufficient documentation

## 2012-06-12 DIAGNOSIS — E1029 Type 1 diabetes mellitus with other diabetic kidney complication: Secondary | ICD-10-CM | POA: Insufficient documentation

## 2012-06-12 DIAGNOSIS — F3289 Other specified depressive episodes: Secondary | ICD-10-CM | POA: Insufficient documentation

## 2012-06-12 DIAGNOSIS — Z8659 Personal history of other mental and behavioral disorders: Secondary | ICD-10-CM | POA: Insufficient documentation

## 2012-06-12 DIAGNOSIS — Z7982 Long term (current) use of aspirin: Secondary | ICD-10-CM | POA: Insufficient documentation

## 2012-06-12 DIAGNOSIS — Z8739 Personal history of other diseases of the musculoskeletal system and connective tissue: Secondary | ICD-10-CM | POA: Insufficient documentation

## 2012-06-12 DIAGNOSIS — E162 Hypoglycemia, unspecified: Secondary | ICD-10-CM

## 2012-06-12 DIAGNOSIS — Z794 Long term (current) use of insulin: Secondary | ICD-10-CM | POA: Insufficient documentation

## 2012-06-12 DIAGNOSIS — Z8639 Personal history of other endocrine, nutritional and metabolic disease: Secondary | ICD-10-CM | POA: Insufficient documentation

## 2012-06-12 DIAGNOSIS — Z79899 Other long term (current) drug therapy: Secondary | ICD-10-CM | POA: Insufficient documentation

## 2012-06-12 NOTE — ED Notes (Signed)
[id number] Expected date:<BR> Expected time:<BR> Means of arrival:<BR> Comments:<BR> Hypoglycemic

## 2012-06-12 NOTE — ED Notes (Signed)
Per EMS pt from [facility], cbg 30 this am, pt given 12.5g D50 with a cbg of 112. Pt a/o x4

## 2012-06-12 NOTE — ED Provider Notes (Signed)
History     CSN: [id number]  Arrival date & time 06/12/12  0909   None     Chief Complaint  Patient presents with  . Hypoglycemia    (Consider location/radiation/quality/duration/timing/severity/associated sxs/prior treatment) HPI This 79 year old male has a history of diabetes, he lives in assisted-living, he always has a slight generalized weakness but felt weaker than usual this morning and his blood sugar checked it was 30 so EMS was notified and given one amp of D50, his repeat blood sugar was over 100 and the patient feels normal now, he has no complaints whatsoever now, he feels back to his baseline weakness, is no headache neck pain back pain chest pain shortness breath abdominal pain nausea vomiting diaphoresis or change in speech vision swallowing or understanding he is no new focal weakness or numbness he has baseline neuropathy numbness in both feet. Treatment prior to arrival consisted of D50. Past Medical History  Diagnosis Date  . ANEMIA-NOS 02/17/2007  . DEPRESSION 12/25/2008  . HYPERTENSION 02/17/2007  . GERD 02/17/2007  . OSTEOARTHRITIS 03/04/2010  . DIABETES MELLITUS, TYPE I, CONTROLLED, WITH RETINOPATHY   . Gastroparesis   . BENIGN PROSTATIC HYPERTROPHY   . VITAMIN B12 DEFICIENCY   . BACK PAIN, LUMBAR   . LIVER DISORDER   . PSA, INCREASED   . Hyperlipidemia   . Diabetes mellitus   . Pneumonia   . Anxiety   . Encephalopathy, metabolic   . Altered mental status   . CAP (community acquired pneumonia)   . Adult failure to thrive   . Hyponatremia   . CKD (chronic kidney disease)   . Bilateral leg edema     Past Surgical History  Procedure Laterality Date  . Transurethral resection of prostate      Family History  Problem Relation Age of Onset  . Cancer Father     had uncertain type of cancer  . Cancer Brother     Prostate Cancer  . Cancer Brother     Prostate Cancer    History  Substance Use Topics  . Smoking status: Never Smoker   .  Smokeless tobacco: Never Used  . Alcohol Use: No      Review of Systems 10 Systems reviewed and are negative for acute change except as noted in the HPI. Allergies  Percocet  Home Medications   Current Outpatient Rx  Name  Route  Sig  Dispense  Refill  . amLODipine (NORVASC) 10 MG tablet   Oral   Take 10 mg by mouth daily.         Marland Kitchen aspirin 81 MG chewable tablet   Oral   Chew 81 mg by mouth daily.         . calcitRIOL (ROCALTROL) 0.25 MCG capsule   Oral   Take 0.25 mcg by mouth daily.          . cholecalciferol (VITAMIN D) 1000 UNITS tablet   Oral   Take 1,000 Units by mouth daily.         Marland Kitchen docusate sodium (COLACE) 100 MG capsule   Oral   Take 100 mg by mouth daily.         Marland Kitchen donepezil (ARICEPT) 10 MG tablet   Oral   Take 10 mg by mouth at bedtime.         . feeding supplement (ENSURE IMMUNE HEALTH) LIQD   Oral   Take 237 mLs by mouth 2 (two) times daily.         Marland Kitchen  hydrALAZINE (APRESOLINE) 25 MG tablet   Oral   Take 1 tablet (25 mg total) by mouth every 8 (eight) hours.   90 tablet   0   . hydrOXYzine (ATARAX/VISTARIL) 25 MG tablet   Oral   Take 1 tablet (25 mg total) by mouth every 6 (six) hours as needed for itching.   12 tablet   0   . insulin glargine (LANTUS) 100 UNIT/ML injection   Subcutaneous   Inject 50 Units into the skin at bedtime.          . insulin lispro (HUMALOG) 100 UNIT/ML injection   Subcutaneous   Inject 2-15 Units into the skin 4 (four) times daily -  before meals and at bedtime. Per Sliding scale 0-120=0units...121-150=2units.Marland KitchenMarland Kitchen151-200=3units.Marland KitchenMarland Kitchen201-250=5units.Marland KitchenMarland Kitchen251-300=8units.Marland KitchenMarland Kitchen301-350=11units.Marland KitchenMarland Kitchen351-400=15units..>400=call    MD         . isosorbide mononitrate (ISMO,MONOKET) 10 MG tablet   Oral   Take 10 mg by mouth 2 (two) times daily.         Marland Kitchen omeprazole (PRILOSEC) 40 MG capsule   Oral   Take 40 mg by mouth daily.         . phenytoin (DILANTIN) 100 MG ER capsule   Oral   Take 300 mg by mouth at  bedtime.         . pravastatin (PRAVACHOL) 80 MG tablet   Oral   Take 80 mg by mouth daily.         Marland Kitchen senna (SENOKOT) 8.6 MG TABS   Oral   Take 1 tablet by mouth daily.         Marland Kitchen triamterene-hydrochlorothiazide (MAXZIDE-25) 37.5-25 MG per tablet   Oral   Take 1 tablet by mouth daily.            BP 160/68  Pulse 87  Temp(Src) 98.6 F (37 C)  Resp 16  SpO2 100%  Physical Exam  Nursing note and vitals reviewed. Constitutional: He is oriented to person, place, and time.  Awake, alert, nontoxic appearance with baseline speech for patient.  HENT:  Head: Atraumatic.  Mouth/Throat: No oropharyngeal exudate.  Eyes: EOM are normal. Pupils are equal, round, and reactive to light. Right eye exhibits no discharge. Left eye exhibits no discharge.  Neck: Neck supple.  Cardiovascular: Normal rate and regular rhythm.   No murmur heard. Pulmonary/Chest: Effort normal and breath sounds normal. No stridor. No respiratory distress. He has no wheezes. He has no rales. He exhibits no tenderness.  Abdominal: Soft. Bowel sounds are normal. He exhibits no mass. There is no tenderness. There is no rebound.  Musculoskeletal: He exhibits edema. He exhibits no tenderness.  Baseline ROM, moves extremities with no obvious new focal weakness. Trace edema to both ankles and feet which patient states he has had in the past and is not new.  Lymphadenopathy:    He has no cervical adenopathy.  Neurological: He is alert and oriented to person, place, and time.  Awake, alert, cooperative and aware of situation; motor strength bilaterally; sensation normal to light touch bilaterally; peripheral visual fields full to confrontation; no facial asymmetry; tongue midline; major cranial nerves appear intact; no pronator drift, normal finger to nose bilaterally  Skin: No rash noted.  Psychiatric: He has a normal mood and affect.    ED Course  Procedures (including critical care time)  Labs Reviewed   GLUCOSE, CAPILLARY  GLUCOSE, CAPILLARY   No results found.   1. Hypoglycemia   2. Diabetes mellitus       MDM  Pt stable in ED with no significant deterioration in condition.  Patient / Family / Caregiver informed of clinical course, understand medical decision-making process, and agree with plan.  I doubt any other EMC precluding discharge at this time including, but not necessarily limited to the following:CVA.      Babette Relic, MD 06/19/12 [phone]

## 2013-01-23 ENCOUNTER — Ambulatory Visit: Payer: Self-pay | Admitting: Podiatry

## 2013-07-05 IMAGING — CR DG CHEST 2V
2 series · 2 of 2 positions shown · non-contrast
Comparison: 04/19/2011

CLINICAL DATA: Pneumonia, follow up.

CHEST - 2 VIEW

[view not recorded (1 of 2)]
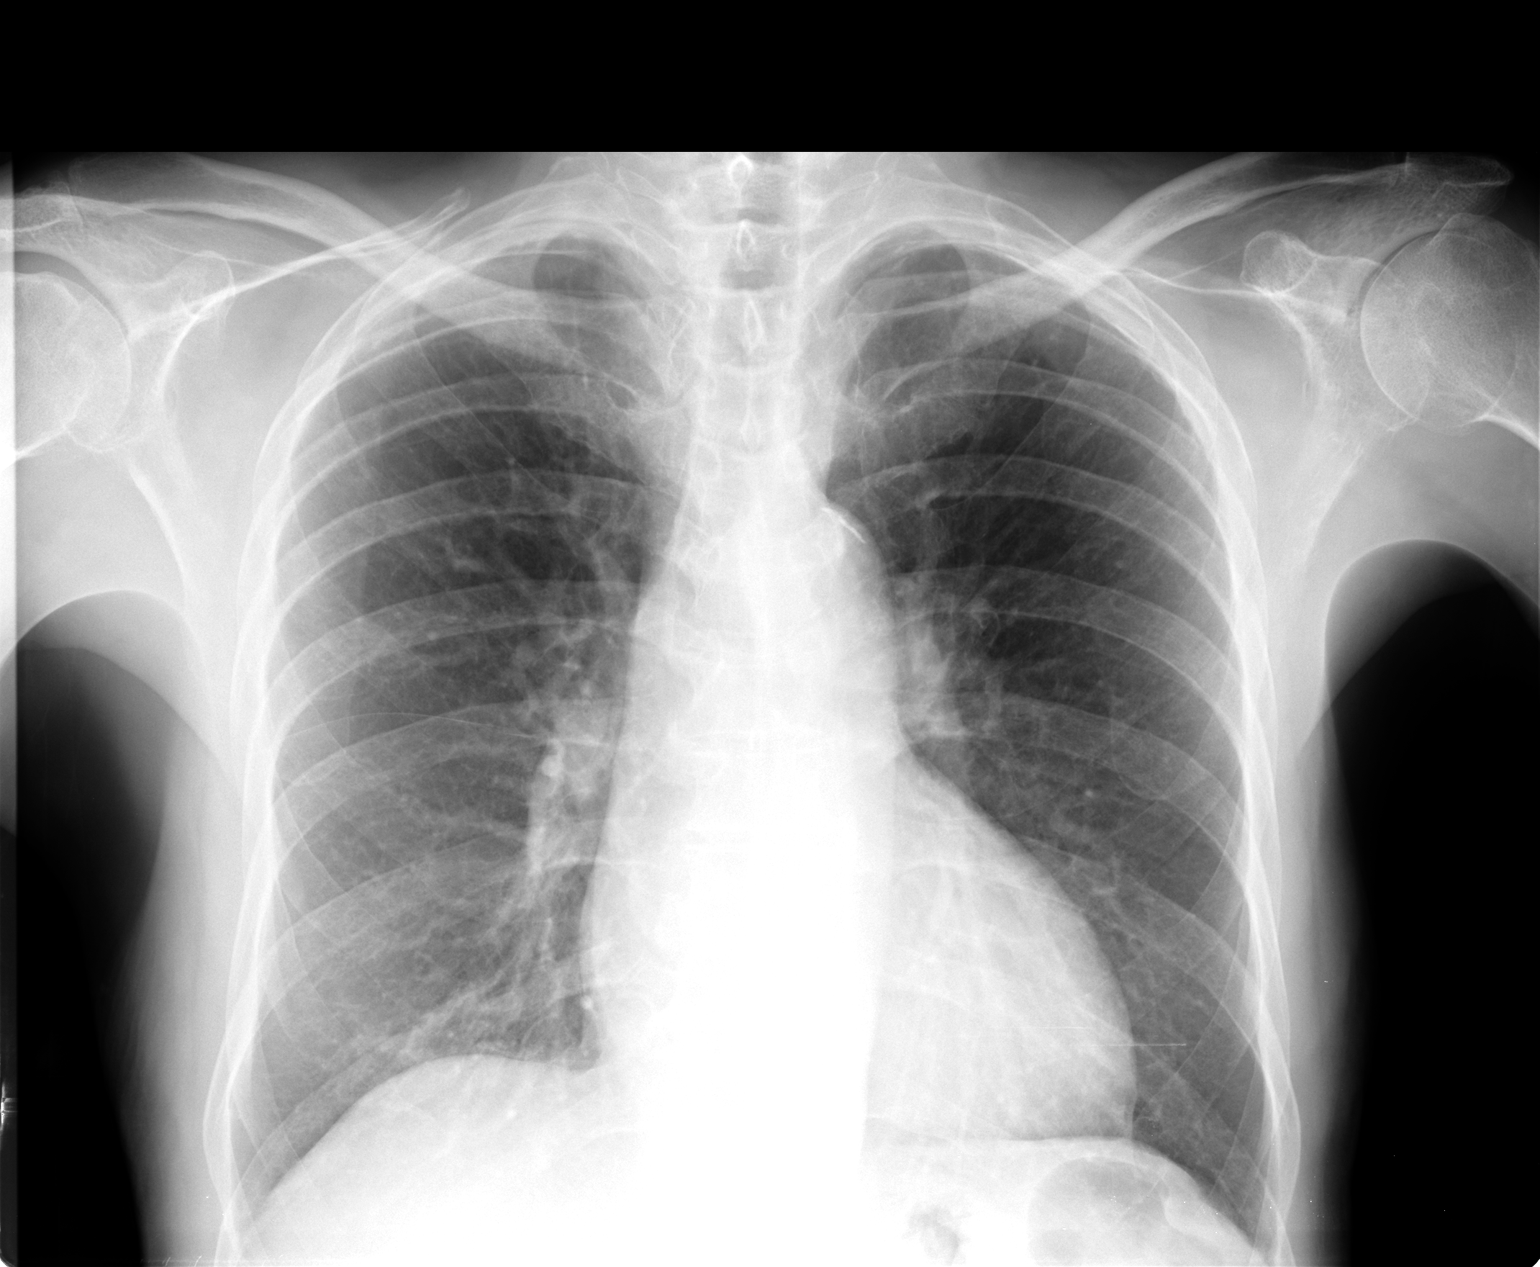

[view not recorded (2 of 2)]
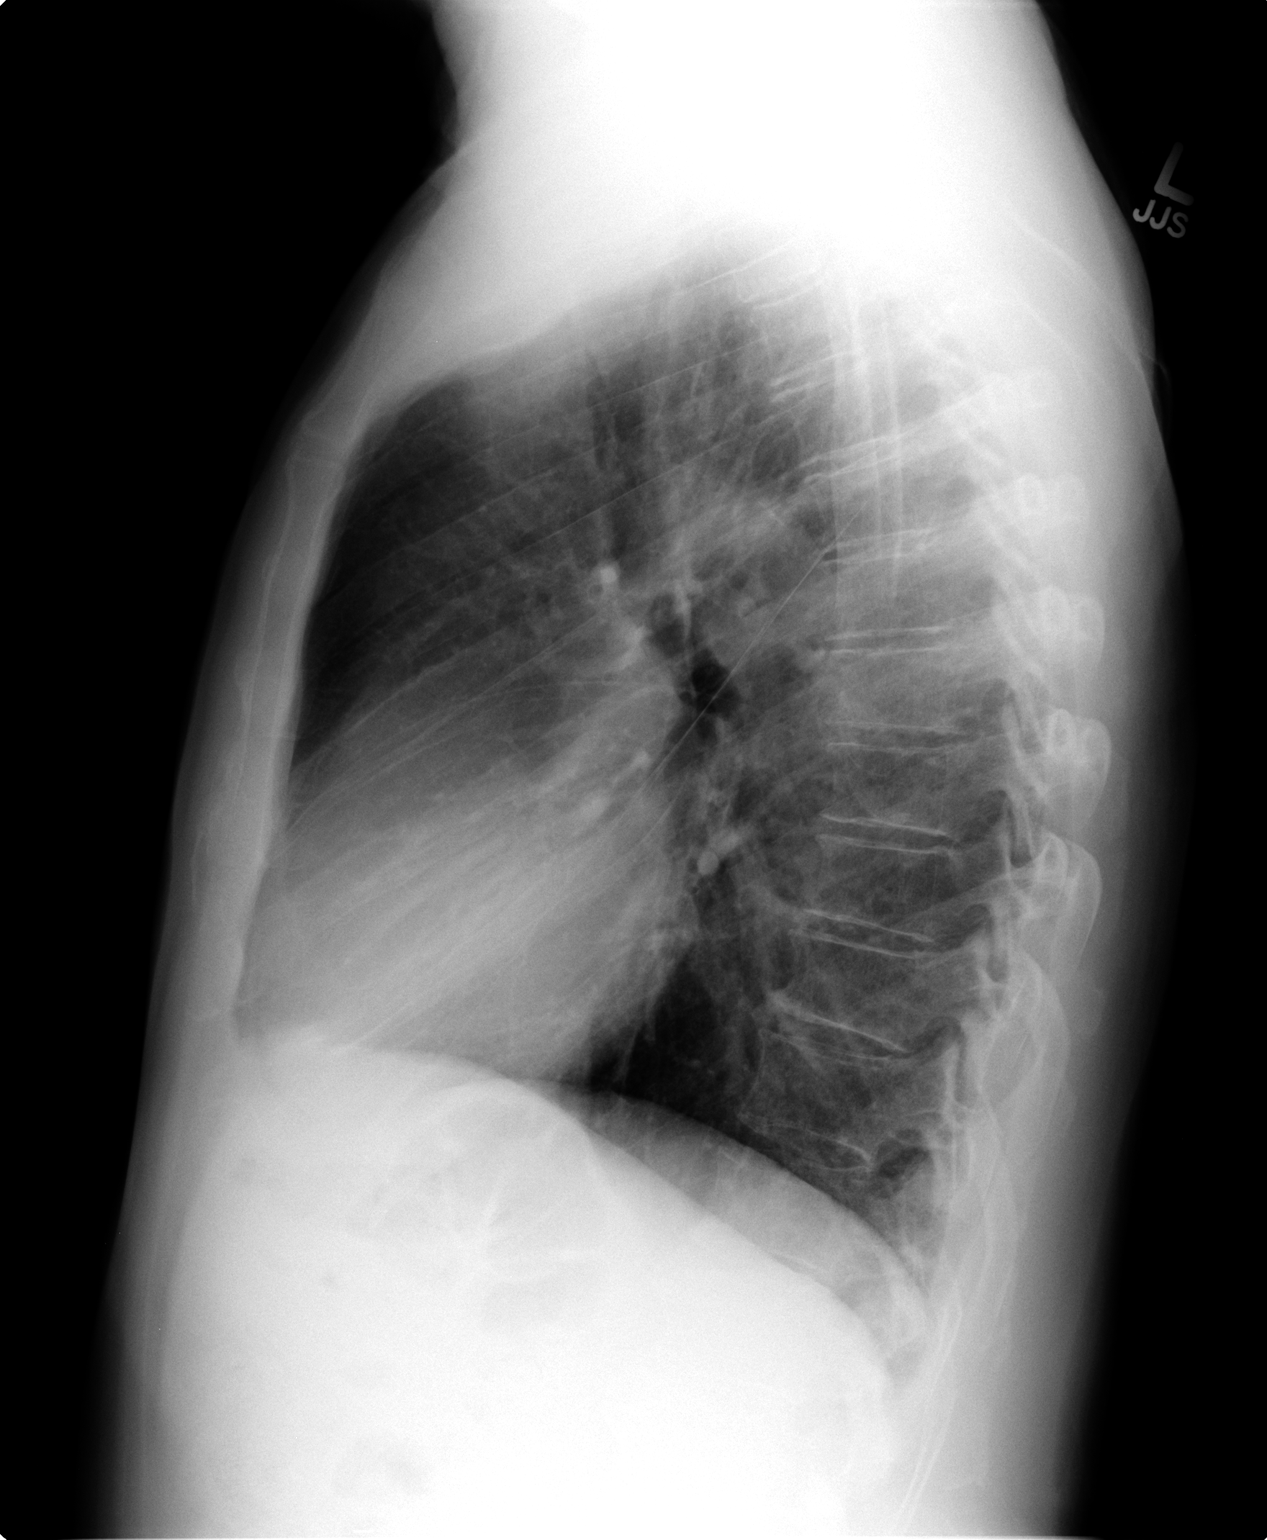

[2 of 2 positions shown; findings below may reference images not displayed]

FINDINGS: Heart is mildly enlarged.  Right basilar opacity has
improved.  Minimal residual opacity persists.  Scarring in the
apices.  Left lung is clear otherwise.  No effusions.  No acute
bony abnormality.
IMPRESSION: Improving right lower lobe opacity with minimal residual right
basilar density.

Mild cardiomegaly.

## 2013-07-06 ENCOUNTER — Other Ambulatory Visit (HOSPITAL_COMMUNITY): Payer: Self-pay | Admitting: Cardiology

## 2013-07-06 DIAGNOSIS — R079 Chest pain, unspecified: Secondary | ICD-10-CM

## 2013-07-07 ENCOUNTER — Encounter (HOSPITAL_COMMUNITY): Payer: Medicare Other

## 2013-07-13 ENCOUNTER — Encounter (HOSPITAL_COMMUNITY): Payer: Medicare Other

## 2013-07-20 ENCOUNTER — Encounter (HOSPITAL_COMMUNITY)
Admission: RE | Admit: 2013-07-20 | Discharge: 2013-07-20 | Disposition: A | Discharge status: Home / Self Care | Payer: Medicare Other | Source: Ambulatory Visit | Attending: Cardiology | Admitting: Cardiology

## 2013-07-20 ENCOUNTER — Other Ambulatory Visit: Payer: Self-pay

## 2013-07-20 ENCOUNTER — Ambulatory Visit (HOSPITAL_COMMUNITY)
Admission: RE | Admit: 2013-07-20 | Discharge: 2013-07-20 | Disposition: A | Discharge status: Home / Self Care | Payer: Medicare Other | Source: Ambulatory Visit | Attending: Cardiology | Admitting: Cardiology

## 2013-07-20 DIAGNOSIS — R079 Chest pain, unspecified: Secondary | ICD-10-CM | POA: Insufficient documentation

## 2013-07-20 MED ORDER — TECHNETIUM TC 99M SESTAMIBI GENERIC - CARDIOLITE
10.0000 | Freq: Once | INTRAVENOUS | Status: AC | PRN
  Administered 2013-07-20: 10 via INTRAVENOUS

## 2013-07-20 MED ORDER — REGADENOSON 0.4 MG/5ML IV SOLN
0.4000 mg | Freq: Once | INTRAVENOUS | Status: AC
  Administered 2013-07-20: 0.4 mg via INTRAVENOUS

## 2013-07-20 MED ORDER — TECHNETIUM TC 99M SESTAMIBI GENERIC - CARDIOLITE
30.0000 | Freq: Once | INTRAVENOUS | Status: AC | PRN
  Administered 2013-07-20: 30 via INTRAVENOUS

## 2013-07-20 MED ORDER — REGADENOSON 0.4 MG/5ML IV SOLN
INTRAVENOUS | Status: AC
  Administered 2013-07-20: 0.4 mg via INTRAVENOUS
  Filled 2013-07-20: qty 5

## 2013-09-11 ENCOUNTER — Encounter: Payer: Self-pay | Admitting: Podiatry

## 2013-09-11 ENCOUNTER — Ambulatory Visit (INDEPENDENT_AMBULATORY_CARE_PROVIDER_SITE_OTHER): Payer: Medicare Other | Admitting: Podiatry

## 2013-09-11 VITALS — BP 211/90 | HR 68 | Resp 16

## 2013-09-11 DIAGNOSIS — B351 Tinea unguium: Secondary | ICD-10-CM

## 2013-09-11 DIAGNOSIS — M79609 Pain in unspecified limb: Secondary | ICD-10-CM

## 2013-09-11 NOTE — Patient Instructions (Signed)
Diabetes and Foot Care Diabetes may cause you to have problems because of poor blood supply (circulation) to your feet and legs. This may cause the skin on your feet to become thinner, break easier, and heal more slowly. Your skin may become dry, and the skin may peel and crack. You may also have nerve damage in your legs and feet causing decreased feeling in them. You may not notice minor injuries to your feet that could lead to infections or more serious problems. Taking care of your feet is one of the most important things you can do for yourself.  HOME CARE INSTRUCTIONS  Wear shoes at all times, even in the house. Do not go barefoot. Bare feet are easily injured.  Check your feet daily for blisters, cuts, and redness. If you cannot see the bottom of your feet, use a mirror or ask someone for help.  Wash your feet with warm water (do not use hot water) and mild soap. Then pat your feet and the areas between your toes until they are completely dry. Do not soak your feet as this can dry your skin.  Apply a moisturizing lotion or petroleum jelly (that does not contain alcohol and is unscented) to the skin on your feet and to dry, brittle toenails. Do not apply lotion between your toes.  Trim your toenails straight across. Do not dig under them or around the cuticle. File the edges of your nails with an emery board or nail file.  Do not cut corns or calluses or try to remove them with medicine.  Wear clean socks or stockings every day. Make sure they are not too tight. Do not wear knee-high stockings since they may decrease blood flow to your legs.  Wear shoes that fit properly and have enough cushioning. To break in new shoes, wear them for just a few hours a day. This prevents you from injuring your feet. Always look in your shoes before you put them on to be sure there are no objects inside.  Do not cross your legs. This may decrease the blood flow to your feet.  If you find a minor scrape,  cut, or break in the skin on your feet, keep it and the skin around it clean and dry. These areas may be cleansed with mild soap and water. Do not cleanse the area with peroxide, alcohol, or iodine.  When you remove an adhesive bandage, be sure not to damage the skin around it.  If you have a wound, look at it several times a day to make sure it is healing.  Do not use heating pads or hot water bottles. They may burn your skin. If you have lost feeling in your feet or legs, you may not know it is happening until it is too late.  Make sure your health care provider performs a complete foot exam at least annually or more often if you have foot problems. Report any cuts, sores, or bruises to your health care provider immediately. SEEK MEDICAL CARE IF:   You have an injury that is not healing.  You have cuts or breaks in the skin.  You have an ingrown nail.  You notice redness on your legs or feet.  You feel burning or tingling in your legs or feet.  You have pain or cramps in your legs and feet.  Your legs or feet are numb.  Your feet always feel cold. SEEK IMMEDIATE MEDICAL CARE IF:   There is increasing redness,   swelling, or pain in or around a wound.  There is a red line that goes up your leg.  Pus is coming from a wound.  You develop a fever or as directed by your health care provider.  You notice a bad smell coming from an ulcer or wound. Document Released: 04/02/2000 Document Revised: 12/06/2012 Document Reviewed: 09/12/2012 [employer] Patient Information 2014 [employer].  

## 2013-09-11 NOTE — Progress Notes (Signed)
He presents today with a chief complaint of painful elongated toenails.  Objective: Pulses are strongly palpable. Nails are thick yellow dystrophic clinically mycotic and painful palpation.  Assessment: Pain in limb secondary to onychomycosis.  Plan: Debridement of nails 1 through 5 bilateral.

## 2013-12-18 ENCOUNTER — Ambulatory Visit: Payer: Medicare Other | Admitting: Podiatry

## 2014-03-07 ENCOUNTER — Ambulatory Visit: Payer: Medicare Other | Admitting: Podiatry

## 2014-03-07 NOTE — Progress Notes (Deleted)
He presents today with a chief complaint of painful elongated toenails.  Objective: Pulses are strongly palpable. Nails are thick yellow dystrophic clinically mycotic and painful palpation.  Assessment: Pain in limb secondary to onychomycosis.  Plan: Debridement of nails 1 through 5 bilateral.

## 2014-03-26 ENCOUNTER — Ambulatory Visit (INDEPENDENT_AMBULATORY_CARE_PROVIDER_SITE_OTHER): Payer: Medicare Other | Admitting: Podiatry

## 2014-03-26 DIAGNOSIS — M79676 Pain in unspecified toe(s): Secondary | ICD-10-CM

## 2014-03-26 DIAGNOSIS — B351 Tinea unguium: Secondary | ICD-10-CM

## 2014-03-26 NOTE — Progress Notes (Signed)
He presents today with a chief complaint of painful elongated toenails.  Objective: Pulses are strongly palpable. Nails are thick yellow dystrophic clinically mycotic and painful palpation.  Assessment: Pain in limb secondary to onychomycosis.  Plan: Debridement of nails 1 through 5 bilateral.

## 2014-03-28 ENCOUNTER — Encounter (HOSPITAL_COMMUNITY): Payer: Self-pay | Admitting: Cardiology

## 2014-04-09 ENCOUNTER — Telehealth: Payer: Self-pay

## 2014-04-09 NOTE — Telephone Encounter (Signed)
Requested call back to discuss flu shot.

## 2014-04-11 ENCOUNTER — Emergency Department (HOSPITAL_COMMUNITY)
Admission: EM | Admit: 2014-04-11 | Discharge: 2014-04-11 | Disposition: A | Discharge status: Home / Self Care | Payer: Medicare Other | Attending: Emergency Medicine | Admitting: Emergency Medicine

## 2014-04-11 ENCOUNTER — Encounter (HOSPITAL_COMMUNITY): Payer: Self-pay | Admitting: Emergency Medicine

## 2014-04-11 ENCOUNTER — Emergency Department (HOSPITAL_COMMUNITY): Payer: Medicare Other

## 2014-04-11 DIAGNOSIS — I129 Hypertensive chronic kidney disease with stage 1 through stage 4 chronic kidney disease, or unspecified chronic kidney disease: Secondary | ICD-10-CM | POA: Insufficient documentation

## 2014-04-11 DIAGNOSIS — N189 Chronic kidney disease, unspecified: Secondary | ICD-10-CM | POA: Diagnosis not present

## 2014-04-11 DIAGNOSIS — Z8739 Personal history of other diseases of the musculoskeletal system and connective tissue: Secondary | ICD-10-CM | POA: Diagnosis not present

## 2014-04-11 DIAGNOSIS — K219 Gastro-esophageal reflux disease without esophagitis: Secondary | ICD-10-CM | POA: Diagnosis not present

## 2014-04-11 DIAGNOSIS — F419 Anxiety disorder, unspecified: Secondary | ICD-10-CM | POA: Diagnosis not present

## 2014-04-11 DIAGNOSIS — R630 Anorexia: Secondary | ICD-10-CM | POA: Insufficient documentation

## 2014-04-11 DIAGNOSIS — W19XXXA Unspecified fall, initial encounter: Secondary | ICD-10-CM

## 2014-04-11 DIAGNOSIS — Z8669 Personal history of other diseases of the nervous system and sense organs: Secondary | ICD-10-CM | POA: Diagnosis not present

## 2014-04-11 DIAGNOSIS — D649 Anemia, unspecified: Secondary | ICD-10-CM | POA: Insufficient documentation

## 2014-04-11 DIAGNOSIS — Z87448 Personal history of other diseases of urinary system: Secondary | ICD-10-CM | POA: Insufficient documentation

## 2014-04-11 DIAGNOSIS — S0591XA Unspecified injury of right eye and orbit, initial encounter: Secondary | ICD-10-CM | POA: Diagnosis not present

## 2014-04-11 DIAGNOSIS — Z7982 Long term (current) use of aspirin: Secondary | ICD-10-CM | POA: Insufficient documentation

## 2014-04-11 DIAGNOSIS — Y9389 Activity, other specified: Secondary | ICD-10-CM | POA: Diagnosis not present

## 2014-04-11 DIAGNOSIS — Z9889 Other specified postprocedural states: Secondary | ICD-10-CM | POA: Insufficient documentation

## 2014-04-11 DIAGNOSIS — Z8701 Personal history of pneumonia (recurrent): Secondary | ICD-10-CM | POA: Insufficient documentation

## 2014-04-11 DIAGNOSIS — Z79899 Other long term (current) drug therapy: Secondary | ICD-10-CM | POA: Insufficient documentation

## 2014-04-11 DIAGNOSIS — Y998 Other external cause status: Secondary | ICD-10-CM | POA: Diagnosis not present

## 2014-04-11 DIAGNOSIS — W1839XA Other fall on same level, initial encounter: Secondary | ICD-10-CM | POA: Insufficient documentation

## 2014-04-11 DIAGNOSIS — R110 Nausea: Secondary | ICD-10-CM | POA: Insufficient documentation

## 2014-04-11 DIAGNOSIS — E860 Dehydration: Secondary | ICD-10-CM | POA: Diagnosis not present

## 2014-04-11 DIAGNOSIS — S199XXA Unspecified injury of neck, initial encounter: Secondary | ICD-10-CM | POA: Insufficient documentation

## 2014-04-11 DIAGNOSIS — R531 Weakness: Secondary | ICD-10-CM | POA: Insufficient documentation

## 2014-04-11 DIAGNOSIS — Y9289 Other specified places as the place of occurrence of the external cause: Secondary | ICD-10-CM | POA: Insufficient documentation

## 2014-04-11 DIAGNOSIS — E785 Hyperlipidemia, unspecified: Secondary | ICD-10-CM | POA: Insufficient documentation

## 2014-04-11 DIAGNOSIS — D61818 Other pancytopenia: Secondary | ICD-10-CM | POA: Diagnosis not present

## 2014-04-11 DIAGNOSIS — Z794 Long term (current) use of insulin: Secondary | ICD-10-CM | POA: Diagnosis not present

## 2014-04-11 DIAGNOSIS — J069 Acute upper respiratory infection, unspecified: Secondary | ICD-10-CM | POA: Diagnosis not present

## 2014-04-11 LAB — URINALYSIS, ROUTINE W REFLEX MICROSCOPIC
Bilirubin Urine: NEGATIVE
GLUCOSE, UA: NEGATIVE mg/dL
Ketones, ur: NEGATIVE mg/dL
Leukocytes, UA: NEGATIVE
Nitrite: NEGATIVE
PROTEIN: 30 mg/dL — AB
Specific Gravity, Urine: 1.006 (ref 1.005–1.030)
UROBILINOGEN UA: 0.2 mg/dL (ref 0.0–1.0)
pH: 7 (ref 5.0–8.0)

## 2014-04-11 LAB — CBC WITH DIFFERENTIAL/PLATELET
BASOS PCT: 0 % (ref 0–1)
Basophils Absolute: 0 10*3/uL (ref 0.0–0.1)
Eosinophils Absolute: 0.1 10*3/uL (ref 0.0–0.7)
Eosinophils Relative: 4 % (ref 0–5)
HCT: 30.7 % — ABNORMAL LOW (ref 39.0–52.0)
Hemoglobin: 10.7 g/dL — ABNORMAL LOW (ref 13.0–17.0)
LYMPHS ABS: 1.4 10*3/uL (ref 0.7–4.0)
Lymphocytes Relative: 37 % (ref 12–46)
MCH: 34.5 pg — ABNORMAL HIGH (ref 26.0–34.0)
MCHC: 34.9 g/dL (ref 30.0–36.0)
MCV: 99 fL (ref 78.0–100.0)
Monocytes Absolute: 0.1 10*3/uL (ref 0.1–1.0)
Monocytes Relative: 1 % — ABNORMAL LOW (ref 3–12)
NEUTROS PCT: 58 % (ref 43–77)
Neutro Abs: 2.2 10*3/uL (ref 1.7–7.7)
PLATELETS: 142 10*3/uL — AB (ref 150–400)
RBC: 3.1 MIL/uL — AB (ref 4.22–5.81)
RDW: 12.3 % (ref 11.5–15.5)
WBC: 3.8 10*3/uL — ABNORMAL LOW (ref 4.0–10.5)

## 2014-04-11 LAB — URINE MICROSCOPIC-ADD ON

## 2014-04-11 LAB — BASIC METABOLIC PANEL
Anion gap: 7 (ref 5–15)
BUN: 28 mg/dL — AB (ref 6–23)
CO2: 26 mmol/L (ref 19–32)
Calcium: 9.3 mg/dL (ref 8.4–10.5)
Chloride: 103 mEq/L (ref 96–112)
Creatinine, Ser: 1.37 mg/dL — ABNORMAL HIGH (ref 0.50–1.35)
GFR, EST AFRICAN AMERICAN: 51 mL/min — AB (ref >90)
GFR, EST NON AFRICAN AMERICAN: 44 mL/min — AB (ref >90)
Glucose, Bld: 140 mg/dL — ABNORMAL HIGH (ref 70–99)
POTASSIUM: 4.2 mmol/L (ref 3.5–5.1)
Sodium: 136 mmol/L (ref 135–145)

## 2014-04-11 LAB — POC OCCULT BLOOD, ED: Fecal Occult Bld: NEGATIVE

## 2014-04-11 MED ORDER — SODIUM CHLORIDE 0.9 % IV BOLUS (SEPSIS)
500.0000 mL | Freq: Once | INTRAVENOUS | Status: AC
  Administered 2014-04-11: 500 mL via INTRAVENOUS

## 2014-04-11 MED ORDER — HYDRALAZINE HCL 25 MG PO TABS
25.0000 mg | ORAL_TABLET | Freq: Once | ORAL | Status: AC
  Administered 2014-04-11: 25 mg via ORAL
  Filled 2014-04-11: qty 1

## 2014-04-11 NOTE — ED Notes (Signed)
Attempted IV start unsuccessful 

## 2014-04-11 NOTE — ED Provider Notes (Signed)
CSN: [id number]     Arrival date & time 04/11/14  1810 History   First MD Initiated Contact with Patient 04/11/14 1824     Chief Complaint  Patient presents with  . Fall     (Consider location/radiation/quality/duration/timing/severity/associated sxs/prior Treatment) Patient is a 80 y.o. male presenting with fall.  Fall This is a new problem. The current episode started today. The problem has been unchanged. Associated symptoms include congestion, coughing, fatigue, nausea, neck pain (side of neck), a sore throat and weakness (general weakness). Pertinent negatives include no abdominal pain, chest pain, chills, fever, headaches, myalgias, numbness, rash, visual change or vomiting. Anorexia: decreased appetite today. Nothing aggravates the symptoms. He has tried nothing for the symptoms. The treatment provided no relief.    Past Medical History  Diagnosis Date  . ANEMIA-NOS 02/17/2007  . DEPRESSION 12/25/2008  . HYPERTENSION 02/17/2007  . GERD 02/17/2007  . OSTEOARTHRITIS 03/04/2010  . DIABETES MELLITUS, TYPE I, CONTROLLED, WITH RETINOPATHY   . Gastroparesis   . BENIGN PROSTATIC HYPERTROPHY   . VITAMIN B12 DEFICIENCY   . BACK PAIN, LUMBAR   . LIVER DISORDER   . PSA, INCREASED   . Hyperlipidemia   . Diabetes mellitus   . Pneumonia   . Anxiety   . Encephalopathy, metabolic   . Altered mental status   . CAP (community acquired pneumonia)   . Adult failure to thrive   . Hyponatremia   . CKD (chronic kidney disease)   . Bilateral leg edema    Past Surgical History  Procedure Laterality Date  . Transurethral resection of prostate    . Left heart catheterization with coronary angiogram N/A 04/23/2011    Procedure: LEFT HEART CATHETERIZATION WITH CORONARY ANGIOGRAM;  Surgeon: Peter M Martinique, MD;  Location: [facility] CATH LAB;  Service: Cardiovascular;  Laterality: N/A;   Family History  Problem Relation Age of Onset  . Cancer Father     had uncertain type of cancer  . Cancer Brother      Prostate Cancer  . Cancer Brother     Prostate Cancer   History  Substance Use Topics  . Smoking status: Never Smoker   . Smokeless tobacco: Never Used  . Alcohol Use: No    Review of Systems  Constitutional: Positive for fatigue. Negative for fever and chills.  HENT: Positive for congestion and sore throat.   Eyes: Negative for visual disturbance.  Respiratory: Positive for cough. Negative for shortness of breath.   Cardiovascular: Negative for chest pain.  Gastrointestinal: Positive for nausea. Negative for vomiting, abdominal pain, diarrhea and constipation. Anorexia: decreased appetite today.  Genitourinary: Negative for difficulty urinating.  Musculoskeletal: Positive for neck pain (side of neck). Negative for myalgias, back pain and neck stiffness.  Skin: Negative for rash.  Neurological: Positive for weakness (general weakness). Negative for syncope, numbness and headaches.      Allergies  Percocet  Home Medications   Prior to Admission medications   Medication Sig Start Date End Date Taking? Authorizing Provider  amLODipine (NORVASC) 10 MG tablet Take 10 mg by mouth daily.   Yes Historical Provider, MD  aspirin 81 MG EC tablet Take 81 mg by mouth daily. 02/18/14  Yes Historical Provider, MD  calcitRIOL (ROCALTROL) 0.25 MCG capsule Take 0.25 mcg by mouth daily.  12/26/11  Yes Historical Provider, MD  cholecalciferol (VITAMIN D) 1000 UNITS tablet Take 1,000 Units by mouth daily.   Yes Historical Provider, MD  docusate sodium (COLACE) 100 MG capsule Take 100 mg by  mouth daily.   Yes Historical Provider, MD  donepezil (ARICEPT) 10 MG tablet Take 10 mg by mouth at bedtime.   Yes Historical Provider, MD  feeding supplement (ENSURE IMMUNE HEALTH) LIQD Take 237 mLs by mouth 2 (two) times daily.   Yes Historical Provider, MD  GuaiFENesin (DIABETIC TUSSIN PO) Take 10 mLs by mouth daily as needed (cold symptoms).   Yes Historical Provider, MD  Insulin Glargine (LANTUS SOLOSTAR)  100 UNIT/ML Solostar Pen Inject 15-25 Units into the skin daily.   Yes Historical Provider, MD  lisinopril (PRINIVIL,ZESTRIL) 40 MG tablet Take 80 mg by mouth daily. 01/18/14  Yes Historical Provider, MD  metoprolol succinate (TOPROL-XL) 25 MG 24 hr tablet Take 25 mg by mouth daily. 02/18/14  Yes Historical Provider, MD  omeprazole (PRILOSEC) 40 MG capsule Take 40 mg by mouth daily.   Yes Historical Provider, MD  phenytoin (DILANTIN) 100 MG ER capsule Take 300 mg by mouth at bedtime.   Yes Historical Provider, MD  senna (SENOKOT) 8.6 MG TABS Take 1 tablet by mouth daily.   Yes Historical Provider, MD  spironolactone (ALDACTONE) 25 MG tablet Take 12.5 mg by mouth daily. 03/29/14  Yes Historical Provider, MD  hydrALAZINE (APRESOLINE) 25 MG tablet Take 1 tablet (25 mg total) by mouth every 8 (eight) hours. 01/10/12   Theodis Blaze, MD  hydrOXYzine (ATARAX/VISTARIL) 25 MG tablet Take 1 tablet (25 mg total) by mouth every 6 (six) hours as needed for itching. 03/29/12   Leota Jacobsen, MD  isosorbide mononitrate (ISMO,MONOKET) 10 MG tablet Take 10 mg by mouth 2 (two) times daily.    Historical Provider, MD  pravastatin (PRAVACHOL) 80 MG tablet Take 80 mg by mouth daily.    Historical Provider, MD  triamterene-hydrochlorothiazide (MAXZIDE-25) 37.5-25 MG per tablet Take 1 tablet by mouth daily.     Historical Provider, MD   BP 187/83 mmHg  Pulse 73  Temp(Src) 97.8 F (36.6 C) (Oral)  Resp 12  SpO2 99% Physical Exam  Constitutional: He is oriented to person, place, and time. He appears well-developed and well-nourished. No distress.  HENT:  Head: Normocephalic and atraumatic.  Mouth/Throat: No oropharyngeal exudate.  Mild tenderness under right eye  Eyes: Conjunctivae and EOM are normal. Pupils are equal, round, and reactive to light.  Neck: Normal range of motion.  Cardiovascular: Normal rate, regular rhythm, normal heart sounds and intact distal pulses.  Exam reveals no gallop and no friction rub.    No murmur heard. Pulmonary/Chest: Effort normal and breath sounds normal. No respiratory distress. He has no wheezes. He has no rales. He exhibits no tenderness.  Abdominal: Soft. He exhibits no distension. There is no tenderness. There is no guarding.  Musculoskeletal: He exhibits no edema.       Cervical back: He exhibits tenderness. He exhibits no bony tenderness.  Neurological: He is alert and oriented to person, place, and time. He has normal strength. No cranial nerve deficit or sensory deficit. Coordination and gait normal. GCS eye subscore is 4. GCS verbal subscore is 5. GCS motor subscore is 6.  Skin: Skin is warm and dry. He is not diaphoretic.  Nursing note and vitals reviewed.   ED Course  Procedures (including critical care time) Labs Review Labs Reviewed  BASIC METABOLIC PANEL - Abnormal; Notable for the following:    Glucose, Bld 140 (*)    BUN 28 (*)    Creatinine, Ser 1.37 (*)    GFR calc non Af Amer 44 (*)  GFR calc Af Amer 51 (*)    All other components within normal limits  CBC WITH DIFFERENTIAL - Abnormal; Notable for the following:    WBC 3.8 (*)    RBC 3.10 (*)    Hemoglobin 10.7 (*)    HCT 30.7 (*)    MCH 34.5 (*)    Platelets 142 (*)    Monocytes Relative 1 (*)    All other components within normal limits  URINALYSIS, ROUTINE W REFLEX MICROSCOPIC - Abnormal; Notable for the following:    Color, Urine STRAW (*)    Hgb urine dipstick SMALL (*)    Protein, ur 30 (*)    All other components within normal limits  URINE CULTURE  URINE MICROSCOPIC-ADD ON  POC OCCULT BLOOD, ED    Imaging Review Dg Chest 2 View  04/11/2014   CLINICAL DATA:  Fall, injury, pain  EXAM: CHEST - 2 VIEW  COMPARISON:  02/08/2012  FINDINGS: Borderline cardiac enlargement with normal vascularity. Lungs remain clear. No focal pneumonia, collapse or consolidation. No edema, effusion or pneumothorax. Atherosclerosis of the aorta and subclavian vasculature. Trachea is midline.  Degenerative changes of the spine. Minor lower lobe atelectasis/scarring on the lateral view noted.  IMPRESSION: Minor lower lobe/scarring on the lateral view projects over the spine.  No other acute finding by plain radiography.   Electronically Signed   By: Daryll Brod M.D.   On: 04/11/2014 19:36   Ct Head Wo Contrast  04/11/2014   CLINICAL DATA:  Fall, head and neck injury  EXAM: CT HEAD WITHOUT CONTRAST  CT CERVICAL SPINE WITHOUT CONTRAST  TECHNIQUE: Multidetector CT imaging of the head and cervical spine was performed following the standard protocol without intravenous contrast. Multiplanar CT image reconstructions of the cervical spine were also generated.  COMPARISON:  02/08/2012  FINDINGS: CT HEAD FINDINGS  Stable diffuse brain atrophy and chronic white matter microvascular ischemic changes throughout the cerebral hemispheres. Mild associated ventricular enlargement. Extensive dural calcifications as before. No acute intracranial hemorrhage, mass lesion, definite infarction, midline shift, herniation, hydrocephalus, or extra-axial fluid collection. No focal mass effect or edema. Cisterns are patent. Cerebellar atrophy as well. Orbits are symmetric. Mastoids are clear. Diffuse sinus mucosal thickening with a left maxillary air-fluid level compatible with sinusitis.  CT CERVICAL SPINE FINDINGS  Normal cervical spine alignment. Severe diffuse cervical spondylosis and degenerative disc disease from C3-C7. No acute fracture, subluxation, or dislocation. Preserved vertebral body heights. No focal kyphosis. Degenerative changes of the C1-2 articulation. Normal prevertebral soft tissues. Clear lung apices. Carotid calcifications noted. No soft tissue asymmetry in the neck.  IMPRESSION: Diffuse brain atrophy and chronic white matter ischemic changes. No interval change or acute process.  Diffuse sinus disease, compatible with sinusitis.  Cervical spondylosis and degenerative disc disease without acute osseous  finding or fracture.   Electronically Signed   By: Daryll Brod M.D.   On: 04/11/2014 20:13   Ct Cervical Spine Wo Contrast  04/11/2014   CLINICAL DATA:  Fall, head and neck injury  EXAM: CT HEAD WITHOUT CONTRAST  CT CERVICAL SPINE WITHOUT CONTRAST  TECHNIQUE: Multidetector CT imaging of the head and cervical spine was performed following the standard protocol without intravenous contrast. Multiplanar CT image reconstructions of the cervical spine were also generated.  COMPARISON:  02/08/2012  FINDINGS: CT HEAD FINDINGS  Stable diffuse brain atrophy and chronic white matter microvascular ischemic changes throughout the cerebral hemispheres. Mild associated ventricular enlargement. Extensive dural calcifications as before. No acute intracranial hemorrhage, mass lesion,  definite infarction, midline shift, herniation, hydrocephalus, or extra-axial fluid collection. No focal mass effect or edema. Cisterns are patent. Cerebellar atrophy as well. Orbits are symmetric. Mastoids are clear. Diffuse sinus mucosal thickening with a left maxillary air-fluid level compatible with sinusitis.  CT CERVICAL SPINE FINDINGS  Normal cervical spine alignment. Severe diffuse cervical spondylosis and degenerative disc disease from C3-C7. No acute fracture, subluxation, or dislocation. Preserved vertebral body heights. No focal kyphosis. Degenerative changes of the C1-2 articulation. Normal prevertebral soft tissues. Clear lung apices. Carotid calcifications noted. No soft tissue asymmetry in the neck.  IMPRESSION: Diffuse brain atrophy and chronic white matter ischemic changes. No interval change or acute process.  Diffuse sinus disease, compatible with sinusitis.  Cervical spondylosis and degenerative disc disease without acute osseous finding or fracture.   Electronically Signed   By: Daryll Brod M.D.   On: 04/11/2014 20:13     EKG Interpretation   Date/Time:  Thursday April 11 2014 18:57:15 EST Ventricular Rate:   76 PR Interval:  191 QRS Duration: 78 QT Interval:  377 QTC Calculation: 424 R Axis:   26 Text Interpretation:  Sinus rhythm Probable left atrial enlargement  Baseline wander in lead(s) II aVR V2 V3 No significant change since last  tracing Confirmed by KNAPP  MD-J, JON [id number]) on 04/11/2014 7:00:04 PM      MDM   Final diagnoses:  Fall  Viral URI  Anemia, unspecified anemia type (hgb 49.21)    80 year old male with a history of diabetes, hypertension, hyperlipidemia presents with concern for 1 week of cough, nasal congestion, with worsening generalized weakness today and mechanical fall from standing.  Patient denies loss of consciousness and fell onto carpet after breaking a window panel. He does not have any lacerations.  He does not have any chest pain or tenderness, abdominal pain or tenderness, thoracic or lumbar pain or tenderness and have low suspicion for intrathoracic, intra-abdominal injuries based off of history and exam.  Given age with history of head trauma a head CT and cervical spine CT were ordered which showed no acute intracranial abnormalities or fracture.   Patient also describes history of cough, nasal congestion and generalized weakness. EKG, CBC, BMP and chest x-ray were ordered.  EKG shows no acute abnormalities. No cp/sob to suggest MI. CXR shows no sign of pneumonia.  Urinalysis shows no signs of UTI.  Pt with mild pancytopenia with wbc 3.8, hgb 10.7, plt 142 and no recent prior labs in system. Pt denies bleeding and POCT hemoccult was negative and doubt GI bleed. Discussed hgb and need to follow up with PCP. Cr mildly elevated from last measurement but similar to prior.  Gave 500cc bolus of NS and reported improvement in symptoms.  Patient able to ambulate with steady gait with walker.  Pt hypertensive while in the ED, however without signs or symptoms to indicate hypertensive emergency including no CP/SOB, no neurologic symptoms, no change in vision, no headache.   His bp improved and he was given nightly dose of hydralazine.  Patient with symptoms consistent with viral URI likely contributing to decreased appetite and mild dehydration.  His symptoms have improved with IVF.  Discussed all results with pt. Patient discharged in stable condition with understanding of reasons to return and will follow up closely with his PCP.     Alvino Chapel, MD 04/12/14 [id number]  Dorie Rank, MD 04/12/14 1640

## 2014-04-11 NOTE — ED Notes (Signed)
Pt ambulated in hallway without difficulty

## 2014-04-11 NOTE — ED Notes (Signed)
Patient transported to CT 

## 2014-04-11 NOTE — ED Notes (Addendum)
Pt states that he has some swelling under his rt eye. No abrasions/lacerations noted. Denies any vision changes. Pt states that he has had generalized weakness, decreased appetite, cough, runny nose for about 1 week.

## 2014-04-11 NOTE — ED Notes (Signed)
Pt reports his legs are weak and he lost balance and fell today. Pt fell into glass door. Pt presents with swelling to R side of eye. Denies any other complaints.

## 2014-04-13 LAB — URINE CULTURE
Colony Count: NO GROWTH
Culture: NO GROWTH

## 2014-05-17 ENCOUNTER — Encounter: Payer: Medicare Other | Attending: Internal Medicine | Admitting: Skilled Nursing Facility1

## 2014-05-17 ENCOUNTER — Encounter: Payer: Self-pay | Admitting: Skilled Nursing Facility1

## 2014-05-17 VITALS — Ht 70.0 in | Wt 167.0 lb

## 2014-05-17 DIAGNOSIS — R634 Abnormal weight loss: Secondary | ICD-10-CM

## 2014-05-17 DIAGNOSIS — K7689 Other specified diseases of liver: Secondary | ICD-10-CM | POA: Insufficient documentation

## 2014-05-17 DIAGNOSIS — Z6823 Body mass index (BMI) 23.0-23.9, adult: Secondary | ICD-10-CM | POA: Diagnosis not present

## 2014-05-17 DIAGNOSIS — Z713 Dietary counseling and surveillance: Secondary | ICD-10-CM | POA: Diagnosis not present

## 2014-05-17 DIAGNOSIS — R609 Edema, unspecified: Secondary | ICD-10-CM | POA: Diagnosis not present

## 2014-05-17 DIAGNOSIS — E441 Mild protein-calorie malnutrition: Secondary | ICD-10-CM | POA: Insufficient documentation

## 2014-05-17 NOTE — Progress Notes (Signed)
Medical Nutrition Therapy:  Appt start time: 1000 end time:  1100.   Assessment:  Primary concerns today: Referred for [place]. Nutrition Focused  Physical exam reveiled some scapular wasting, some temperol wasting, puffiness under the eye, weak grip strength, and the clavical area was not wasting. Patient also had bilateral pitting edema in ankle/lower leg. Patient weighed in at 162 on April 17, 2014 but on April 16, 2014 he weighed in at 153 pounds so these weights cannot be trusted as accurate. Patient was accompanied by his sister Vicente Males which stated she was concerned about his nutrition and one week ago the patient had high blood sugar (200). The pateint as well as his sister states he eats most of his food. Vicente Males states he has a great appetite. The patient has a helper that helps with his daily living such as cooking, baths, and checks his blood sugar and blood pressure. His helper checks his blood sugar every morning and his sister sometimes checks it when she comes over in the afternoon. Patient lives by himself. The patient states he was At skilled care for 1.5 years then assisted living for 1.5 years, he has been at home since may/june 2015. 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 In 2012 he was admitted to the hospital and required a feeding tube but has not needed one since. The patient's sister states he has not drunken an ensure for a month. Patient feels his urination matches what he drinks but has been constipated for the past three days and is usually constipated. Patient states he fell around christmeas eve and sees a physical therapist since then. The patient states he has had no swallowing difficulty, can chew well, and the only GI distress he complains of is constipation. He was a pleasant man that was congnitively aware and has a great support system in his sister, there are 7 surviving siblings of the 9. A1C on April 17, 2014 was 5.1.   Patient states his doctor  advised him to drink 5 bottles of water but he has only been drinking 2.  Preferred Learning Style:   No preference indicated   Learning Readiness:   Ready   MEDICATIONS: See List   DIETARY INTAKE:  Usual eating pattern includes 3 meals and 3 snacks per day.  Everyday foods include none stated.  Avoided foods include none stated.    24-hr recall:  B ( AM): eggs, [place] sausage, grits or oatmeal---bacon Snk ( AM): peanut butter and crackers L ( PM): vegetable soup----hamburgers----greens-----potato salad Snk ( PM): applesauce D ( PM): same as lunch Snk ( PM): yogurt Beverages: flavored water, pepsi, coffee  Usual physical activity: Physical Therapy  Estimated energy needs: 1800 calories 200 g carbohydrates 135 g protein 50 g fat  Progress Towards Goal(s):  In progress.   Nutritional Diagnosis:  [place]-3.2 Unintentional weight loss As related to advancing age and increased protein needs due to liver dysfunction .  As evidenced by medical record.    Intervention:  Nutrition Counseling for malnutrition. Dietitian educated the patient on the importance of vegetable and fruit intake, fluid intake, movement, and some tips for having a bowel movement.  Goals:  -Check your blood sugars at different times of the day throughout the week -Be sure to have your helper offer you milk with your breakfast -Try adding in a third cup of water  -If your appetite decreases and you stop eating as much as you usually do start drinking ensure plus -After talking  with your physician and your physical therapist try some other options to get you moving, do NOT attempt new movement when no one else is home -Aim for 98!   Teaching Method Utilized:  Visual Auditory Hands on  Barriers to learning/adherence to lifestyle change: Advancing age.  Demonstrated degree of understanding via:  Teach Back   Monitoring/Evaluation:  Dietary intake and body weight prn.

## 2014-05-17 NOTE — Patient Instructions (Addendum)
-  Check your blood sugars at different times of the day throughout the week -Be sure to have your helper offer you milk with your breakfast -Try adding in a third cup of water  -If your appetite decreases and you stop eating as much as you usually do start drinking ensure plus -after talking with your physician and your physical therapist try some other options to get you moving, do NOT attempt new movement when no one else is home -Aim for 98!

## 2014-06-25 ENCOUNTER — Encounter: Payer: Self-pay | Admitting: Podiatry

## 2014-06-25 ENCOUNTER — Ambulatory Visit (INDEPENDENT_AMBULATORY_CARE_PROVIDER_SITE_OTHER): Payer: Medicare Other | Admitting: Podiatry

## 2014-06-25 VITALS — BP 180/93 | HR 66 | Resp 18

## 2014-06-25 DIAGNOSIS — B351 Tinea unguium: Secondary | ICD-10-CM | POA: Diagnosis not present

## 2014-06-25 DIAGNOSIS — M79676 Pain in unspecified toe(s): Secondary | ICD-10-CM

## 2014-06-25 NOTE — Progress Notes (Signed)
He presents today with chief complaint of painful elongated toenails and corns and calluses bilateral.  Objective: Her signs are stable he is alert and 3 pulses are palpable bilateral moderate edema bilaterally. No open lesions. Reactive hyperkeratotic lesions plantar aspect of the bilateral foot and thick yellow dystrophic onychomycotic nails which are painful to palpation as well as debridement.  Assessment: History of diabetes mellitus with diabetic peripheral neuropathy pain and limb secondary to onychomycosis and porokeratotic lesions.  Plan: Debrided reactive hyperkeratosis and debrided nails 1 through 5 bilateral covered service secondary to pain.

## 2014-07-09 ENCOUNTER — Telehealth: Payer: Self-pay | Admitting: *Deleted

## 2014-07-09 NOTE — Telephone Encounter (Signed)
Left message for patient to call back and discuss flu shot

## 2014-07-09 NOTE — Telephone Encounter (Signed)
done

## 2014-10-03 ENCOUNTER — Encounter: Payer: Self-pay | Admitting: Podiatry

## 2014-10-03 ENCOUNTER — Ambulatory Visit (INDEPENDENT_AMBULATORY_CARE_PROVIDER_SITE_OTHER): Payer: Medicare Other | Admitting: Podiatry

## 2014-10-03 DIAGNOSIS — B351 Tinea unguium: Secondary | ICD-10-CM

## 2014-10-03 DIAGNOSIS — M79676 Pain in unspecified toe(s): Secondary | ICD-10-CM

## 2014-10-03 DIAGNOSIS — E1140 Type 2 diabetes mellitus with diabetic neuropathy, unspecified: Secondary | ICD-10-CM

## 2014-10-03 NOTE — Progress Notes (Signed)
Patient ID: Eric [name], male   DOB: 1923-07-16, 81 y.o.   MRN: [id number] HPI  Complaint:  Visit Type: Patient returns to my office for continued preventative foot care services. Complaint: Patient states" my nails have grown long and thick and become painful to walk and wear shoes" Patient has been diagnosed with DM with neuropathy. .. He presents for preventative foot care services. No changes to ROS  Podiatric Exam: Vascular: dorsalis pedis and posterior tibial pulses are negative. Capillary return is immediate. Temperature gradient is negative. Skin turgor WNL, bilateral swelling  Sensorium: Normal Semmes Weinstein monofilament test. Normal tactile sensation bilaterally.  Nail Exam: Pt has thick disfigured discolored nails with subungual debris noted bilateral entire nail hallux through fifth toenails Ulcer Exam: There is no evidence of ulcer or pre-ulcerative changes or infection. Orthopedic Exam: Muscle tone and strength are WNL. No limitations in general ROM. No crepitus or effusions noted. Foot type and digits show no abnormalities. Bony prominences are unremarkable. Skin: No Porokeratosis. No infection or ulcers  Diagnosis:  Tinea unguium, Pain in right toe, pain in left toes  Treatment & Plan Procedures and Treatment: Consent by patient was obtained for treatment procedures. The patient understood the discussion of treatment and procedures well. All questions were answered thoroughly reviewed. Debridement of mycotic and hypertrophic toenails, 1 through 5 bilateral and clearing of subungual debris. No ulceration, no infection noted.  Return Visit-Office Procedure: Patient instructed to return to the office for a follow up visit 3 months for continued evaluation and treatment.

## 2014-11-09 ENCOUNTER — Emergency Department (HOSPITAL_COMMUNITY): Payer: Medicare Other

## 2014-11-09 ENCOUNTER — Encounter (HOSPITAL_COMMUNITY): Payer: Self-pay | Admitting: Emergency Medicine

## 2014-11-09 ENCOUNTER — Observation Stay (HOSPITAL_COMMUNITY): Payer: Medicare Other

## 2014-11-09 ENCOUNTER — Observation Stay (HOSPITAL_COMMUNITY)
Admission: EM | Admit: 2014-11-09 | Discharge: 2014-11-13 | Disposition: A | Discharge status: Home / Self Care | Payer: Medicare Other | Attending: Internal Medicine | Admitting: Internal Medicine

## 2014-11-09 DIAGNOSIS — N189 Chronic kidney disease, unspecified: Secondary | ICD-10-CM | POA: Insufficient documentation

## 2014-11-09 DIAGNOSIS — Z794 Long term (current) use of insulin: Secondary | ICD-10-CM | POA: Insufficient documentation

## 2014-11-09 DIAGNOSIS — G40909 Epilepsy, unspecified, not intractable, without status epilepticus: Secondary | ICD-10-CM | POA: Diagnosis not present

## 2014-11-09 DIAGNOSIS — F0390 Unspecified dementia without behavioral disturbance: Secondary | ICD-10-CM | POA: Diagnosis not present

## 2014-11-09 DIAGNOSIS — G459 Transient cerebral ischemic attack, unspecified: Principal | ICD-10-CM | POA: Insufficient documentation

## 2014-11-09 DIAGNOSIS — N400 Enlarged prostate without lower urinary tract symptoms: Secondary | ICD-10-CM | POA: Diagnosis not present

## 2014-11-09 DIAGNOSIS — E871 Hypo-osmolality and hyponatremia: Secondary | ICD-10-CM | POA: Insufficient documentation

## 2014-11-09 DIAGNOSIS — Z79899 Other long term (current) drug therapy: Secondary | ICD-10-CM | POA: Diagnosis not present

## 2014-11-09 DIAGNOSIS — I129 Hypertensive chronic kidney disease with stage 1 through stage 4 chronic kidney disease, or unspecified chronic kidney disease: Secondary | ICD-10-CM | POA: Insufficient documentation

## 2014-11-09 DIAGNOSIS — E1039 Type 1 diabetes mellitus with other diabetic ophthalmic complication: Secondary | ICD-10-CM

## 2014-11-09 DIAGNOSIS — F419 Anxiety disorder, unspecified: Secondary | ICD-10-CM | POA: Diagnosis not present

## 2014-11-09 DIAGNOSIS — I130 Hypertensive heart and chronic kidney disease with heart failure and stage 1 through stage 4 chronic kidney disease, or unspecified chronic kidney disease: Secondary | ICD-10-CM

## 2014-11-09 DIAGNOSIS — N179 Acute kidney failure, unspecified: Secondary | ICD-10-CM | POA: Diagnosis not present

## 2014-11-09 DIAGNOSIS — R4182 Altered mental status, unspecified: Secondary | ICD-10-CM | POA: Insufficient documentation

## 2014-11-09 DIAGNOSIS — I10 Essential (primary) hypertension: Secondary | ICD-10-CM | POA: Diagnosis present

## 2014-11-09 DIAGNOSIS — I5032 Chronic diastolic (congestive) heart failure: Secondary | ICD-10-CM

## 2014-11-09 DIAGNOSIS — IMO0002 Reserved for concepts with insufficient information to code with codable children: Secondary | ICD-10-CM | POA: Diagnosis present

## 2014-11-09 DIAGNOSIS — E538 Deficiency of other specified B group vitamins: Secondary | ICD-10-CM | POA: Insufficient documentation

## 2014-11-09 DIAGNOSIS — E785 Hyperlipidemia, unspecified: Secondary | ICD-10-CM | POA: Diagnosis not present

## 2014-11-09 DIAGNOSIS — K219 Gastro-esophageal reflux disease without esophagitis: Secondary | ICD-10-CM | POA: Diagnosis present

## 2014-11-09 DIAGNOSIS — E1065 Type 1 diabetes mellitus with hyperglycemia: Secondary | ICD-10-CM

## 2014-11-09 DIAGNOSIS — G9341 Metabolic encephalopathy: Secondary | ICD-10-CM | POA: Diagnosis not present

## 2014-11-09 DIAGNOSIS — K3184 Gastroparesis: Secondary | ICD-10-CM | POA: Diagnosis not present

## 2014-11-09 DIAGNOSIS — N183 Chronic kidney disease, stage 3 unspecified: Secondary | ICD-10-CM | POA: Diagnosis present

## 2014-11-09 DIAGNOSIS — E1022 Type 1 diabetes mellitus with diabetic chronic kidney disease: Secondary | ICD-10-CM

## 2014-11-09 DIAGNOSIS — E10319 Type 1 diabetes mellitus with unspecified diabetic retinopathy without macular edema: Secondary | ICD-10-CM | POA: Diagnosis not present

## 2014-11-09 LAB — COMPREHENSIVE METABOLIC PANEL
ALK PHOS: 120 U/L (ref 38–126)
ALT: 16 U/L — ABNORMAL LOW (ref 17–63)
AST: 19 U/L (ref 15–41)
Albumin: 3.1 g/dL — ABNORMAL LOW (ref 3.5–5.0)
Anion gap: 7 (ref 5–15)
BILIRUBIN TOTAL: 0.4 mg/dL (ref 0.3–1.2)
BUN: 29 mg/dL — ABNORMAL HIGH (ref 6–20)
CO2: 25 mmol/L (ref 22–32)
Calcium: 8.8 mg/dL — ABNORMAL LOW (ref 8.9–10.3)
Chloride: 103 mmol/L (ref 101–111)
Creatinine, Ser: 1.71 mg/dL — ABNORMAL HIGH (ref 0.61–1.24)
GFR calc Af Amer: 39 mL/min — ABNORMAL LOW (ref >60)
GFR calc non Af Amer: 34 mL/min — ABNORMAL LOW (ref >60)
Glucose, Bld: 100 mg/dL — ABNORMAL HIGH (ref 65–99)
Potassium: 3.8 mmol/L (ref 3.5–5.1)
SODIUM: 135 mmol/L (ref 135–145)
Total Protein: 6.8 g/dL (ref 6.5–8.1)

## 2014-11-09 LAB — CBC
HEMATOCRIT: 31.7 % — AB (ref 39.0–52.0)
Hemoglobin: 10.7 g/dL — ABNORMAL LOW (ref 13.0–17.0)
MCH: 33.2 pg (ref 26.0–34.0)
MCHC: 33.8 g/dL (ref 30.0–36.0)
MCV: 98.4 fL (ref 78.0–100.0)
PLATELETS: 150 10*3/uL (ref 150–400)
RBC: 3.22 MIL/uL — ABNORMAL LOW (ref 4.22–5.81)
RDW: 12.9 % (ref 11.5–15.5)
WBC: 4.2 10*3/uL (ref 4.0–10.5)

## 2014-11-09 LAB — URINALYSIS, ROUTINE W REFLEX MICROSCOPIC
BILIRUBIN URINE: NEGATIVE
Glucose, UA: NEGATIVE mg/dL
Hgb urine dipstick: NEGATIVE
Ketones, ur: NEGATIVE mg/dL
Leukocytes, UA: NEGATIVE
Nitrite: NEGATIVE
PROTEIN: NEGATIVE mg/dL
SPECIFIC GRAVITY, URINE: 1.011 (ref 1.005–1.030)
UROBILINOGEN UA: 0.2 mg/dL (ref 0.0–1.0)
pH: 6.5 (ref 5.0–8.0)

## 2014-11-09 LAB — PROTIME-INR
INR: 1.09 (ref 0.00–1.49)
PROTHROMBIN TIME: 14.3 s (ref 11.6–15.2)

## 2014-11-09 LAB — RAPID URINE DRUG SCREEN, HOSP PERFORMED
AMPHETAMINES: NOT DETECTED
BARBITURATES: NOT DETECTED
Benzodiazepines: NOT DETECTED
Cocaine: NOT DETECTED
Opiates: NOT DETECTED
TETRAHYDROCANNABINOL: NOT DETECTED

## 2014-11-09 LAB — I-STAT TROPONIN, ED: Troponin i, poc: 0 ng/mL (ref 0.00–0.08)

## 2014-11-09 LAB — DIFFERENTIAL
BASOS PCT: 0 % (ref 0–1)
Basophils Absolute: 0 10*3/uL (ref 0.0–0.1)
Eosinophils Absolute: 0.1 10*3/uL (ref 0.0–0.7)
Eosinophils Relative: 2 % (ref 0–5)
Lymphocytes Relative: 33 % (ref 12–46)
Lymphs Abs: 1.4 10*3/uL (ref 0.7–4.0)
MONO ABS: 0.1 10*3/uL (ref 0.1–1.0)
Monocytes Relative: 2 % — ABNORMAL LOW (ref 3–12)
NEUTROS ABS: 2.7 10*3/uL (ref 1.7–7.7)
Neutrophils Relative %: 63 % (ref 43–77)

## 2014-11-09 LAB — APTT: aPTT: 20 seconds — ABNORMAL LOW (ref 24–37)

## 2014-11-09 LAB — GLUCOSE, CAPILLARY: Glucose-Capillary: 68 mg/dL (ref 65–99)

## 2014-11-09 LAB — ETHANOL

## 2014-11-09 MED ORDER — VITAMIN D 1000 UNITS PO TABS
1000.0000 [IU] | ORAL_TABLET | Freq: Every day | ORAL | Status: DC
  Administered 2014-11-10 – 2014-11-13 (×4): 1000 [IU] via ORAL
  Filled 2014-11-09 (×4): qty 1

## 2014-11-09 MED ORDER — LORATADINE 10 MG PO TABS
10.0000 mg | ORAL_TABLET | Freq: Every day | ORAL | Status: DC
  Administered 2014-11-10 – 2014-11-13 (×4): 10 mg via ORAL
  Filled 2014-11-09 (×4): qty 1

## 2014-11-09 MED ORDER — DOCUSATE SODIUM 100 MG PO CAPS
100.0000 mg | ORAL_CAPSULE | Freq: Every day | ORAL | Status: DC
  Administered 2014-11-10 – 2014-11-13 (×4): 100 mg via ORAL
  Filled 2014-11-09 (×4): qty 1

## 2014-11-09 MED ORDER — CALCITRIOL 0.25 MCG PO CAPS
0.2500 ug | ORAL_CAPSULE | Freq: Every day | ORAL | Status: DC
  Administered 2014-11-10 – 2014-11-13 (×4): 0.25 ug via ORAL
  Filled 2014-11-09 (×4): qty 1

## 2014-11-09 MED ORDER — INSULIN GLARGINE 100 UNIT/ML ~~LOC~~ SOLN
10.0000 [IU] | Freq: Every day | SUBCUTANEOUS | Status: DC
  Administered 2014-11-10 – 2014-11-11 (×2): 10 [IU] via SUBCUTANEOUS
  Filled 2014-11-09 (×4): qty 0.1

## 2014-11-09 MED ORDER — ASPIRIN EC 81 MG PO TBEC
81.0000 mg | DELAYED_RELEASE_TABLET | Freq: Every day | ORAL | Status: DC
  Administered 2014-11-10: 81 mg via ORAL
  Filled 2014-11-09: qty 1

## 2014-11-09 MED ORDER — INSULIN ASPART 100 UNIT/ML ~~LOC~~ SOLN: 0.0000 [IU] | SUBCUTANEOUS | Status: DC

## 2014-11-09 MED ORDER — PHENYTOIN SODIUM EXTENDED 100 MG PO CAPS
300.0000 mg | ORAL_CAPSULE | Freq: Every day | ORAL | Status: DC
  Administered 2014-11-09 – 2014-11-12 (×4): 300 mg via ORAL
  Filled 2014-11-09 (×4): qty 3

## 2014-11-09 MED ORDER — PANTOPRAZOLE SODIUM 40 MG PO TBEC
40.0000 mg | DELAYED_RELEASE_TABLET | Freq: Every day | ORAL | Status: DC
Start: 2014-11-10 — End: 2014-11-13
  Administered 2014-11-10 – 2014-11-13 (×4): 40 mg via ORAL
  Filled 2014-11-09 (×4): qty 1

## 2014-11-09 MED ORDER — POLYETHYLENE GLYCOL 3350 17 G PO PACK: 17.0000 g | PACK | Freq: Every day | ORAL | Status: DC | PRN

## 2014-11-09 MED ORDER — SENNA 8.6 MG PO TABS
1.0000 | ORAL_TABLET | Freq: Every day | ORAL | Status: DC
  Administered 2014-11-11 – 2014-11-13 (×3): 8.6 mg via ORAL
  Filled 2014-11-09 (×3): qty 1

## 2014-11-09 MED ORDER — ENOXAPARIN SODIUM 30 MG/0.3ML ~~LOC~~ SOLN
30.0000 mg | SUBCUTANEOUS | Status: DC
  Administered 2014-11-09 – 2014-11-12 (×4): 30 mg via SUBCUTANEOUS
  Filled 2014-11-09 (×4): qty 0.3

## 2014-11-09 MED ORDER — FUROSEMIDE 20 MG PO TABS
20.0000 mg | ORAL_TABLET | Freq: Two times a day (BID) | ORAL | Status: DC
  Administered 2014-11-10: 20 mg via ORAL
  Filled 2014-11-09: qty 1

## 2014-11-09 MED ORDER — STROKE: EARLY STAGES OF RECOVERY BOOK
Freq: Once | Status: AC
  Administered 2014-11-09: 22:00:00

## 2014-11-09 MED ORDER — FLUTICASONE PROPIONATE 50 MCG/ACT NA SUSP
1.0000 | Freq: Every day | NASAL | Status: DC
  Administered 2014-11-11 – 2014-11-13 (×3): 1 via NASAL
  Filled 2014-11-09 (×2): qty 16

## 2014-11-09 MED ORDER — DONEPEZIL HCL 10 MG PO TABS
10.0000 mg | ORAL_TABLET | Freq: Every day | ORAL | Status: DC
  Administered 2014-11-09 – 2014-11-12 (×4): 10 mg via ORAL
  Filled 2014-11-09 (×4): qty 1

## 2014-11-09 MED ORDER — ENOXAPARIN SODIUM 40 MG/0.4ML ~~LOC~~ SOLN: 40.0000 mg | SUBCUTANEOUS | Status: DC

## 2014-11-09 NOTE — ED Notes (Signed)
Pt remains in MRI 

## 2014-11-09 NOTE — ED Notes (Addendum)
Resident at bedside.  

## 2014-11-09 NOTE — ED Provider Notes (Signed)
CSN: [id number]     Arrival date & time 11/09/14  1554 History   First MD Initiated Contact with Patient 11/09/14 1603     Chief Complaint  Patient presents with  . Aphasia   (Consider location/radiation/quality/duration/timing/severity/associated sxs/prior Treatment) HPI  Eric [name] is an 81yo man with PMH of HTN, GERD, DM, BPH, HLD, CKD, Grade 1 diastolic CHF, dementia who presents for difficulty with speech and right-sided leg weakness today. Patient reports she was at church today when he had difficulty standing up and noticed he had right leg weakness. He then began attempting to speak and felt that he could not get his words out. His sister witnessed this and foundto be slurred. They called EMS for evaluation by the time they arrived his symptoms had resolved. Currently denies any weakness, numbness, headache, chest pain, shortness of breath, lightheadedness, dizziness, photophobia.   Past Medical History  Diagnosis Date  . ANEMIA-NOS 02/17/2007  . DEPRESSION 12/25/2008  . HYPERTENSION 02/17/2007  . GERD 02/17/2007  . OSTEOARTHRITIS 03/04/2010  . DIABETES MELLITUS, TYPE I, CONTROLLED, WITH RETINOPATHY   . Gastroparesis   . BENIGN PROSTATIC HYPERTROPHY   . VITAMIN B12 DEFICIENCY   . BACK PAIN, LUMBAR   . LIVER DISORDER   . PSA, INCREASED   . Hyperlipidemia   . Diabetes mellitus   . Pneumonia   . Anxiety   . Encephalopathy, metabolic   . Altered mental status   . CAP (community acquired pneumonia)   . Adult failure to thrive   . Hyponatremia   . CKD (chronic kidney disease)   . Bilateral leg edema    Past Surgical History  Procedure Laterality Date  . Transurethral resection of prostate    . Left heart catheterization with coronary angiogram N/A 04/23/2011    Procedure: LEFT HEART CATHETERIZATION WITH CORONARY ANGIOGRAM;  Surgeon: Peter M Martinique, MD;  Location: [facility] CATH LAB;  Service: Cardiovascular;  Laterality: N/A;   Family History  Problem Relation Age of Onset  .  Cancer Father     had uncertain type of cancer  . Cancer Brother     Prostate Cancer  . Cancer Brother     Prostate Cancer  . Diabetes Other    History  Substance Use Topics  . Smoking status: Never Smoker   . Smokeless tobacco: Never Used  . Alcohol Use: No    Review of Systems  Constitutional: Negative for fever and chills.  HENT: Negative for congestion and sore throat.   Eyes: Negative for pain.  Respiratory: Negative for cough and shortness of breath.   Cardiovascular: Negative for chest pain and palpitations.  Gastrointestinal: Negative for nausea, vomiting, abdominal pain and diarrhea.  Endocrine: Negative.   Genitourinary: Negative for flank pain.  Musculoskeletal: Negative for back pain and neck pain.  Skin: Negative for rash.  Allergic/Immunologic: Negative.   Neurological: Positive for speech difficulty, weakness (right sided) and light-headedness. Negative for dizziness, tremors, seizures, syncope, facial asymmetry, numbness and headaches.  Psychiatric/Behavioral: Negative for confusion.      Allergies  Percocet  Home Medications   Prior to Admission medications   Medication Sig Start Date End Date Taking? Authorizing Provider  acetaminophen (TYLENOL) 500 MG tablet Take 500 mg by mouth 2 (two) times daily.   Yes Historical Provider, MD  aspirin 81 MG EC tablet Take 81 mg by mouth daily. 02/18/14  Yes Historical Provider, MD  calcitRIOL (ROCALTROL) 0.25 MCG capsule Take 0.25 mcg by mouth daily.  12/26/11  Yes Historical Provider,  MD  cholecalciferol (VITAMIN D) 1000 UNITS tablet Take 1,000 Units by mouth daily.   Yes Historical Provider, MD  docusate sodium (COLACE) 100 MG capsule Take 100 mg by mouth daily.   Yes Historical Provider, MD  donepezil (ARICEPT) 10 MG tablet Take 10 mg by mouth at bedtime.   Yes Historical Provider, MD  furosemide (LASIX) 20 MG tablet Take 20 mg by mouth 2 (two) times daily.  06/20/14  Yes Historical Provider, MD  hydrALAZINE  (APRESOLINE) 25 MG tablet Take 1 tablet (25 mg total) by mouth every 8 (eight) hours. 01/10/12  Yes Theodis Blaze, MD  Insulin Glargine (LANTUS SOLOSTAR) 100 UNIT/ML Solostar Pen Inject 20 Units into the skin daily.    Yes Historical Provider, MD  lisinopril (PRINIVIL,ZESTRIL) 40 MG tablet Take 40 mg by mouth 2 (two) times daily.  01/18/14  Yes Historical Provider, MD  loratadine (CLARITIN) 10 MG tablet Take 10 mg by mouth daily with lunch.   Yes Historical Provider, MD  metoprolol succinate (TOPROL-XL) 25 MG 24 hr tablet Take 25 mg by mouth daily. 02/18/14  Yes Historical Provider, MD  omeprazole (PRILOSEC) 40 MG capsule Take 40 mg by mouth daily before breakfast.    Yes Historical Provider, MD  OVER THE COUNTER MEDICATION Apply 1 application topically daily. Over the counter cream to prevent rash on buttocks   Yes Historical Provider, MD  phenytoin (DILANTIN) 100 MG ER capsule Take 300 mg by mouth at bedtime.   Yes Historical Provider, MD  Polyethyl Glycol-Propyl Glycol (SYSTANE ULTRA) 0.4-0.3 % SOLN Place 1 drop into both eyes 3 (three) times daily as needed (dry eyes).   Yes Historical Provider, MD  polyethylene glycol powder (GLYCOLAX/MIRALAX) powder Take 17 g by mouth daily as needed.  05/18/14  Yes Historical Provider, MD  senna (SENOKOT) 8.6 MG TABS Take 1 tablet by mouth daily.   Yes Historical Provider, MD  spironolactone (ALDACTONE) 25 MG tablet Take 12.5 mg by mouth at bedtime.  03/29/14  Yes Historical Provider, MD  Triamcinolone Acetonide (NASACORT AQ NA) Place 1 spray into both nostrils daily.   Yes Historical Provider, MD  ACCU-CHEK AVIVA PLUS test strip  05/01/14   Historical Provider, MD  B-D UF III MINI PEN NEEDLES 31G X 5 MM MISC  06/20/14   Historical Provider, MD  hydrOXYzine (ATARAX/VISTARIL) 25 MG tablet Take 1 tablet (25 mg total) by mouth every 6 (six) hours as needed for itching. Patient not taking: Reported on 11/09/2014 03/29/12   Lacretia Leigh, MD   BP 160/90 mmHg  Pulse 66   Temp(Src) 97.9 F (36.6 C) (Oral)  Resp 14  Ht 5\' 10"  (1.778 m)  Wt 169 lb 8.5 oz (76.9 kg)  BMI 24.33 kg/m2  SpO2 100% Physical Exam  Constitutional: He is oriented to person, place, and time. He appears well-developed and well-nourished.  HENT:  Head: Normocephalic and atraumatic.  Eyes: Conjunctivae and EOM are normal. Pupils are equal, round, and reactive to light.  Neck: Normal range of motion. Neck supple.  Cardiovascular: Normal rate, regular rhythm, normal heart sounds and intact distal pulses.   Pulmonary/Chest: Effort normal and breath sounds normal. No respiratory distress.  Abdominal: Soft. Bowel sounds are normal. There is no tenderness.  Musculoskeletal: Normal range of motion.  Neurological: He is alert and oriented to person, place, and time. He has normal strength and normal reflexes. He displays normal reflexes. No cranial nerve deficit or sensory deficit. He displays a negative Romberg sign. GCS eye subscore is  4. GCS verbal subscore is 5. GCS motor subscore is 6.  Normal finger to nose bilaterally.  Rapid alternating movements intact bilaterally.  Normal heal to shin bilaterally.   No pronator drift bilaterally.    Skin: Skin is warm and dry.    ED Course  Procedures (including critical care time) Labs Review Labs Reviewed  APTT - Abnormal; Notable for the following:    aPTT 20 (*)    All other components within normal limits  CBC - Abnormal; Notable for the following:    RBC 3.22 (*)    Hemoglobin 10.7 (*)    HCT 31.7 (*)    All other components within normal limits  DIFFERENTIAL - Abnormal; Notable for the following:    Monocytes Relative 2 (*)    All other components within normal limits  COMPREHENSIVE METABOLIC PANEL - Abnormal; Notable for the following:    Glucose, Bld 100 (*)    BUN 29 (*)    Creatinine, Ser 1.71 (*)    Calcium 8.8 (*)    Albumin 3.1 (*)    ALT 16 (*)    GFR calc non Af Amer 34 (*)    GFR calc Af Amer 39 (*)    All other  components within normal limits  LIPID PANEL - Abnormal; Notable for the following:    HDL 31 (*)    LDL Cholesterol 106 (*)    All other components within normal limits  GLUCOSE, CAPILLARY - Abnormal; Notable for the following:    Glucose-Capillary 126 (*)    All other components within normal limits  GLUCOSE, CAPILLARY - Abnormal; Notable for the following:    Glucose-Capillary 105 (*)    All other components within normal limits  GLUCOSE, CAPILLARY - Abnormal; Notable for the following:    Glucose-Capillary 189 (*)    All other components within normal limits  ETHANOL  PROTIME-INR  URINE RAPID DRUG SCREEN, HOSP PERFORMED  URINALYSIS, ROUTINE W REFLEX MICROSCOPIC (NOT AT [facility])  GLUCOSE, CAPILLARY  GLUCOSE, CAPILLARY  HEMOGLOBIN A1C  SODIUM, URINE, RANDOM  CREATININE, URINE, RANDOM  I-STAT CHEM 8, ED  I-STAT TROPOININ, ED    Imaging Review Dg Chest 2 View  11/10/2014   CLINICAL DATA:  Transient ischemic attack, acute onset. Initial encounter.  EXAM: CHEST  2 VIEW  COMPARISON:  Chest radiograph performed 04/11/2014  FINDINGS: The lungs are well-aerated. Mild scarring is noted at the left upper lung zone. There is no evidence of pleural effusion or pneumothorax.  The heart is normal in size; the mediastinal contour is within normal limits. No acute osseous abnormalities are seen.  IMPRESSION: Mild scarring at the left upper lung zone.  Lungs otherwise clear.   Electronically Signed   By: Garald Balding M.D.   On: 11/10/2014 03:02   Ct Head Wo Contrast  11/09/2014   CLINICAL DATA:  81 year old male with slurred speech and right leg numbness  EXAM: CT HEAD WITHOUT CONTRAST  TECHNIQUE: Contiguous axial images were obtained from the base of the skull through the vertex without intravenous contrast.  COMPARISON:  CT dated 04/11/2014  FINDINGS: The ventricles are dilated and the sulci are prominent compatible with age-related atrophy. Periventricular and deep white matter hypodensities  represent chronic microvascular ischemic changes. There is no intracranial hemorrhage. No mass effect or midline shift identified.  The visualized paranasal sinuses and mastoid air cells are well aerated. The calvarium is intact.  IMPRESSION: No acute intracranial pathology.  Age-related atrophy and chronic microvascular ischemic disease.  If symptoms persist and there are no contraindications, MRI may provide better evaluation if clinically indicated.   Electronically Signed   By: Anner Crete M.D.   On: 11/09/2014 17:19   Mr Brain Wo Contrast  11/09/2014   CLINICAL DATA:  81 year old hypertensive diabetic male with speech difficulty. Subsequent encounter.  EXAM: MRI HEAD WITHOUT CONTRAST  TECHNIQUE: Multiplanar, multiecho pulse sequences of the brain and surrounding structures were obtained without intravenous contrast.  COMPARISON:  11/09/2014 CT.  02/09/2012 MR.  FINDINGS: No acute infarct.  No intracranial hemorrhage.  Very mild small vessel disease type changes.  Global atrophy. Ventricular prominence felt to be related to atrophy rather than hydrocephalus.  No intracranial mass lesion noted on this unenhanced exam.  Major intracranial vascular structures are patent.  Prominent calcification of the falx and tentorium.  Exophthalmos.  Post left lens replacement.  Complete opacification left mastoid air cells and left middle ear cavity. No obstructing lesion of the eustachian tube noted. Partial opacification of the petrous apex bilaterally.  Mild spinal stenosis C3-4. Mild transverse ligament hypertrophy. Cervical medullary junction unremarkable.  Partially empty sella.  Pineal region unremarkable.  IMPRESSION: No acute infarct.  Very mild small vessel disease type changes.  Global atrophy.  Complete opacification left mastoid air cells and left middle ear cavity. No obstructing lesion of the eustachian tube noted. Partial opacification of the new petrous apex bilaterally.   Electronically Signed   By:  Genia Del M.D.   On: 11/09/2014 21:07   Mr Jodene Nam Head/brain Wo Cm  11/10/2014   CLINICAL DATA:  Initial evaluation for acute slurred speech.  EXAM: MRA HEAD WITHOUT CONTRAST  TECHNIQUE: Angiographic images of the Circle of Willis were obtained using MRA technique without intravenous contrast.  COMPARISON:  None.  FINDINGS: ANTERIOR CIRCULATION:  Study is degraded by motion artifact.  Visualized distal cervical segments of the internal carotid arteries are widely patent with antegrade flow. The petrous, cavernous, and supra clinoid segments are widely patent. A1 segments, anterior communicating artery, and anterior cerebral arteries well opacified bilaterally. M1 segments widely patent without stenosis or occlusion. MCA bifurcations within normal limits. Distal MCA branches demonstrate multi focal atheromatous irregularity, but are fairly symmetric bilaterally.  POSTERIOR CIRCULATION:  Vertebral arteries are widely patent to the vertebrobasilar junction. The right vertebral artery is slightly dominant. Posterior inferior cerebellar arteries patent proximally. Basilar artery widely patent. Right anterior inferior cerebral artery is dominant. Superior cerebellar arteries well opacified bilaterally. Both posterior cerebral arteries arise from the basilar artery. Posterior cerebral arteries are opacified to their distal aspects. Apparent signal loss within the distal right P2 segment felt to be artifactual in nature due to motion artifact through this area on this exam. Distal branch atheromatous irregularity within the PCA branches bilaterally.  No aneurysm or vascular malformation.  IMPRESSION: 1. No large or proximal arterial branch occlusion identified within the intracranial circulation. No hemodynamically significant or correctable stenosis identified. 2. Atheromatous irregularity within the distal posterior cerebral arteries and distal MCA branches.   Electronically Signed   By: Jeannine Boga M.D.    On: 11/10/2014 01:40     EKG Interpretation   Date/Time:  Saturday November 09 2014 16:04:47 EDT Ventricular Rate:  75 PR Interval:  330 QRS Duration: 80 QT Interval:  380 QTC Calculation: 424 R Axis:   8 Text Interpretation:  Sinus rhythm Prolonged PR interval Confirmed by  Hazle Coca [phone]) on 11/09/2014 4:17:10 PM      MDM   Final diagnoses:  TIA (transient ischemic attack)  TIA (transient ischemic attack)  TIA (transient ischemic attack)  TIA (transient ischemic attack)    On initial evaluation patient was hemodynamically stable and in no acute distress. No continued symptoms of weakness or difficulty with speech on exam and no code stroke called.  Patient complains of no chest pain at this time and do not feel ACS. TIA labs performed showing mild AKI.  No headache or neck pain and doubt atypical migraine or vertebral/carotid dissection.  Head CT performed showing no acute bleeds or intracranial abnormalities. Patient remained stable in the emergency department and was admitted to hospitalist Dr. Daryll Drown for further treatment and workup of TIA.  If performed, labs, EKGs, and imaging were reviewed/interpreted by myself and my attending and incorporated into medical decision making.  Discussed pertinent finding with patient or caregiver prior to admission with no further questions.  Pt care supervised by my attending Dr. Erby Pian, MD PGY-2  Emergency Medicine     Geronimo Boot, MD 11/10/14 [place], MD 11/10/14 2150

## 2014-11-09 NOTE — ED Notes (Signed)
Dr.Rees shown Istat hem8 results.Ed-lab.

## 2014-11-09 NOTE — H&P (Signed)
[facility] History and Physical  Eric [name] [id number] DOB: 29-Jan-1924 DOA: 11/09/2014  Referring physician: ED physician PCP: Renato Shin, MD   Chief Complaint: dysarthria  HPI:  Eric [name] is an 81yo man with PMH of HTN, GERD< DM, BPH, HLD, CKD, Grade 1 diastolic CHF, dementia who presents for dysarthria.  His symptoms started today at about 230pm after church.  He was talking to a friend and noticed that his words were garbled.  He knew what he was trying to say, but could not get it out.  He also had right leg weakness and had to be helped out of his chair.  The leg weakness has happened once before, Wednesday of this week, after exercising at the [facility].  His symptoms resolved by the time EMS had arrived.  He has never had a stroke before.  He denies falling, sensation changes, lightheadedness, dizziness, recent illness, nausea, vomiting, chest pain, SOB.  Risk factors for stroke include HTN, DM, HLD.  CT head done in the ED showed no acute pathology.  MRI showed no acute pathology, mild atherosclerosis.    In the ED, he was found to have some mild AKI on CKD and stable normocytic anemia.  CE X 1 negative.  He has no chest pain or signs of ACS.   Assessment and Plan:    TIA (transient ischemic attack) - Symptoms have resolved, neuro exam normalized - Admit for stroke work up - TTE, CD ordered - A1C, Lipid panel - Holding BP medications overnight to allow for permissive HTN - Telemetry - Neuro checks - PT/OT/SLP - Advance diet if passes swallow screen - Continue home aspirin - Neuro consult in the AM  DM (diabetes mellitus), type 1, uncontrolled w/ophthalmic complication - Last [id number] in our system is from 2013, 10.6 - Check A1C - Started lantus at 10 units (at home on 25) - SSI    Hyperlipidemia - Last LDL 61 in our system - Not on a statin, start after lipid panel in the AM - Lipid panel    Essential hypertension - Patient and caretaker report compliance  with BP medications - Holding hydralazine, lisinopril, metoprolol, spironolactone to allow for permissive HTN overnight - Restart 24-48 hours after symptoms  GERD - Continue prilosec  History of seizure - Continue dilantin - Check level  Grade 1 dCHF, stable - Continue lasix - Check TTE (last in 2013)    CKD (chronic kidney disease) stage 3, GFR 30-59 ml/min - Worsened from 1.3 in December to 1.7 today, did not appear dry on exam - If passes swallow screen, can encourage PO fluids - No IVF tonight given h/o CHF and elevated BP - Consider IVF in the morning if renal function not improved or unchanged; alternatively, could get more recent labs from outside source    Hyponatremia - Low normal when checked today - Trend with BMETs.    Radiological Exams on Admission: Ct Head Wo Contrast  11/09/2014   CLINICAL DATA:  81 year old male with slurred speech and right leg numbness  EXAM: CT HEAD WITHOUT CONTRAST  TECHNIQUE: Contiguous axial images were obtained from the base of the skull through the vertex without intravenous contrast.  COMPARISON:  CT dated 04/11/2014  FINDINGS: The ventricles are dilated and the sulci are prominent compatible with age-related atrophy. Periventricular and deep white matter hypodensities represent chronic microvascular ischemic changes. There is no intracranial hemorrhage. No mass effect or midline shift identified.  The visualized paranasal sinuses and mastoid air cells are  well aerated. The calvarium is intact.  IMPRESSION: No acute intracranial pathology.  Age-related atrophy and chronic microvascular ischemic disease.  If symptoms persist and there are no contraindications, MRI may provide better evaluation if clinically indicated.   Electronically Signed   By: Anner Crete M.D.   On: 11/09/2014 17:19   Mr Brain Wo Contrast  11/09/2014   CLINICAL DATA:  81 year old hypertensive diabetic male with speech difficulty. Subsequent encounter.  EXAM: MRI HEAD  WITHOUT CONTRAST  TECHNIQUE: Multiplanar, multiecho pulse sequences of the brain and surrounding structures were obtained without intravenous contrast.  COMPARISON:  11/09/2014 CT.  02/09/2012 MR.  FINDINGS: No acute infarct.  No intracranial hemorrhage.  Very mild small vessel disease type changes.  Global atrophy. Ventricular prominence felt to be related to atrophy rather than hydrocephalus.  No intracranial mass lesion noted on this unenhanced exam.  Major intracranial vascular structures are patent.  Prominent calcification of the falx and tentorium.  Exophthalmos.  Post left lens replacement.  Complete opacification left mastoid air cells and left middle ear cavity. No obstructing lesion of the eustachian tube noted. Partial opacification of the petrous apex bilaterally.  Mild spinal stenosis C3-4. Mild transverse ligament hypertrophy. Cervical medullary junction unremarkable.  Partially empty sella.  Pineal region unremarkable.  IMPRESSION: No acute infarct.  Very mild small vessel disease type changes.  Global atrophy.  Complete opacification left mastoid air cells and left middle ear cavity. No obstructing lesion of the eustachian tube noted. Partial opacification of the new petrous apex bilaterally.   Electronically Signed   By: Genia Del M.D.   On: 11/09/2014 21:07   Code Status: Full, confirmed with patient.  Family Communication: Pt at bedside Disposition Plan: Admit for further evaluation    Gilles Chiquito, MD [phone]   Review of Systems:  Constitutional: Negative for fever, chills and malaise/fatigue. Negative for diaphoresis.  HENT: Negative for hearing loss, ear pain  Eyes: Negative for blurred vision, double vision, photophobia Respiratory: Negative for cough, hemoptysis, sputum production, shortness of breath Cardiovascular: Negative for chest pain, palpitations, and leg swelling.  Gastrointestinal: Negative for nausea, vomiting and abdominal pain.  Genitourinary: Negative for  dysuria, urgency, frequency  Musculoskeletal: Negative for myalgias, back pain,and falls.  Skin: Negative for itching and rash.  Neurological: + for focal weakness, dyarthria Negative for dizziness, tingling, tremors, sensory change, loss of consciousness and headaches.  Endo/Heme/Allergies: Negative for environmental allergies and polydipsia. Does not bruise/bleed easily.    Past Medical History  Diagnosis Date  . ANEMIA-NOS 02/17/2007  . DEPRESSION 12/25/2008  . HYPERTENSION 02/17/2007  . GERD 02/17/2007  . OSTEOARTHRITIS 03/04/2010  . DIABETES MELLITUS, TYPE I, CONTROLLED, WITH RETINOPATHY   . Gastroparesis   . BENIGN PROSTATIC HYPERTROPHY   . VITAMIN B12 DEFICIENCY   . BACK PAIN, LUMBAR   . LIVER DISORDER   . PSA, INCREASED   . Hyperlipidemia   . Diabetes mellitus   . Pneumonia   . Anxiety   . Encephalopathy, metabolic   . Altered mental status   . CAP (community acquired pneumonia)   . Adult failure to thrive   . Hyponatremia   . CKD (chronic kidney disease)   . Bilateral leg edema     Past Surgical History  Procedure Laterality Date  . Transurethral resection of prostate    . Left heart catheterization with coronary angiogram N/A 04/23/2011    Procedure: LEFT HEART CATHETERIZATION WITH CORONARY ANGIOGRAM;  Surgeon: Peter M Martinique, MD;  Location: [facility] CATH LAB;  Service: Cardiovascular;  Laterality: N/A;    Social History:  reports that he has never smoked. He has never used smokeless tobacco. He reports that he does not drink alcohol or use illicit drugs.  Confirmed with patient  Allergies  Allergen Reactions  . Percocet [Oxycodone-Acetaminophen] Rash    Family History  Problem Relation Age of Onset  . Cancer Father     had uncertain type of cancer  . Cancer Brother     Prostate Cancer  . Cancer Brother     Prostate Cancer  . Diabetes Other    Friend, who is his caretaker, confirms that he takes these medications.  Prior to Admission medications    Medication Sig Start Date End Date Taking? Authorizing Provider  acetaminophen (TYLENOL) 500 MG tablet Take 500 mg by mouth 2 (two) times daily.   Yes Historical Provider, MD  aspirin 81 MG EC tablet Take 81 mg by mouth daily. 02/18/14  Yes Historical Provider, MD  calcitRIOL (ROCALTROL) 0.25 MCG capsule Take 0.25 mcg by mouth daily.  12/26/11  Yes Historical Provider, MD  cholecalciferol (VITAMIN D) 1000 UNITS tablet Take 1,000 Units by mouth daily.   Yes Historical Provider, MD  docusate sodium (COLACE) 100 MG capsule Take 100 mg by mouth daily.   Yes Historical Provider, MD  donepezil (ARICEPT) 10 MG tablet Take 10 mg by mouth at bedtime.   Yes Historical Provider, MD  furosemide (LASIX) 20 MG tablet Take 20 mg by mouth 2 (two) times daily.  06/20/14  Yes Historical Provider, MD  hydrALAZINE (APRESOLINE) 25 MG tablet Take 1 tablet (25 mg total) by mouth every 8 (eight) hours. 01/10/12  Yes Theodis Blaze, MD  Insulin Glargine (LANTUS SOLOSTAR) 100 UNIT/ML Solostar Pen Inject 20 Units into the skin daily.    Yes Historical Provider, MD  lisinopril (PRINIVIL,ZESTRIL) 40 MG tablet Take 40 mg by mouth 2 (two) times daily.  01/18/14  Yes Historical Provider, MD  loratadine (CLARITIN) 10 MG tablet Take 10 mg by mouth daily with lunch.   Yes Historical Provider, MD  metoprolol succinate (TOPROL-XL) 25 MG 24 hr tablet Take 25 mg by mouth daily. 02/18/14  Yes Historical Provider, MD  omeprazole (PRILOSEC) 40 MG capsule Take 40 mg by mouth daily before breakfast.    Yes Historical Provider, MD  OVER THE COUNTER MEDICATION Apply 1 application topically daily. Over the counter cream to prevent rash on buttocks   Yes Historical Provider, MD  phenytoin (DILANTIN) 100 MG ER capsule Take 300 mg by mouth at bedtime.   Yes Historical Provider, MD  Polyethyl Glycol-Propyl Glycol (SYSTANE ULTRA) 0.4-0.3 % SOLN Place 1 drop into both eyes 3 (three) times daily as needed (dry eyes).   Yes Historical Provider, MD   polyethylene glycol powder (GLYCOLAX/MIRALAX) powder Take 17 g by mouth daily as needed.  05/18/14  Yes Historical Provider, MD  senna (SENOKOT) 8.6 MG TABS Take 1 tablet by mouth daily.   Yes Historical Provider, MD  spironolactone (ALDACTONE) 25 MG tablet Take 12.5 mg by mouth at bedtime.  03/29/14  Yes Historical Provider, MD  Triamcinolone Acetonide (NASACORT AQ NA) Place 1 spray into both nostrils daily.   Yes Historical Provider, MD  ACCU-CHEK AVIVA PLUS test strip  05/01/14   Historical Provider, MD  B-D UF III MINI PEN NEEDLES 31G X 5 MM MISC  06/20/14   Historical Provider, MD  hydrOXYzine (ATARAX/VISTARIL) 25 MG tablet Take 1 tablet (25 mg total) by mouth every 6 (six) hours  as needed for itching. Patient not taking: Reported on 11/09/2014 03/29/12   Lacretia Leigh, MD    Physical Exam: Filed Vitals:   11/09/14 1900 11/09/14 1930 11/09/14 2053 11/09/14 2109  BP: 172/66 178/82 192/88 199/84  Pulse: 73 76 76 84  Temp:    98.2 F (36.8 C)  TempSrc:    Oral  Resp: 24 12 16 18   Height:    5\' 10"  (1.778 m)  Weight:    169 lb 8.5 oz (76.9 kg)  SpO2: 100% 100% 98% 98%    Physical Exam  Constitutional: Appears well-developed and well-nourished. No distress. speech is fluent and easy to understand.  HENT: Normocephalic. Oropharynx is clear and moist.  Eyes: Conjunctivae are normal. PERRLA, no scleral icterus.  Neck: Normal ROM. Neck supple.  CVS: RRR, S1/S2 +, no murmurs, Pulmonary: Effort and breath sounds normal, rhonchi, wheezes, rales.  Abdominal: Soft. BS +,  no distension, tenderness Musculoskeletal: No edema and no tenderness.  Neuro: Alert. Normal muscle tone and coordination. No cranial nerve deficit.  RAM were normal, sensation intact bilaterally to light touch.  Strength is 5/5 throughout.  He had good prosody, named three items without issue, oriented X 4.  Negative pronator drift  Skin: Skin is warm and dry. No rash noted. Not diaphoretic.  Psychiatric: Normal mood and  affect. Behavior, judgment, thought content normal.   Labs on Admission:  Basic Metabolic Panel:  Recent Labs Lab 11/09/14 1634  NA 135  K 3.8  CL 103  CO2 25  GLUCOSE 100*  BUN 29*  CREATININE 1.71*  CALCIUM 8.8*   Liver Function Tests:  Recent Labs Lab 11/09/14 1634  AST 19  ALT 16*  ALKPHOS 120  BILITOT 0.4  PROT 6.8  ALBUMIN 3.1*   CBC:  Recent Labs Lab 11/09/14 1634  WBC 4.2  NEUTROABS 2.7  HGB 10.7*  HCT 31.7*  MCV 98.4  PLT 150   TnI 0.00  CBG:  Recent Labs Lab 11/09/14 2154  GLUCAP 68    EKG: Normal sinus rhythm, prolonged PR (lengthened since last EKG in 2015)   If 7PM-7AM, please contact night-coverage [email or web address] Password [facility] 11/09/2014, 10:34 PM

## 2014-11-09 NOTE — ED Notes (Signed)
Per EMS: patient has noticed difficulty with speaking today and yesterday, slurred speech.  Lives by self. Sister noticed at church today that he had slurred speech as well.  Ems noticed drift on right side as well.  Also felt feverish with ems (99.1 temporal), has noticed dark urine lately.,  Hypertensive 183/101, 74 bpm nsr, cbg 117.  Hx of htn, dm, is compliant with meds.

## 2014-11-09 NOTE — Progress Notes (Signed)
Pt arrived via stretcher from ED arrived in room 4N30. Pt placed on tele box 4N30. Alert and oriented x4. Sister at bedside. Oriented to room and equipment. Will continue to monitor.

## 2014-11-09 NOTE — Progress Notes (Addendum)
Tele called saying pt is in second degree heart block with missed beats. Strip charted.

## 2014-11-09 NOTE — ED Notes (Signed)
Patient transported to CT 

## 2014-11-10 ENCOUNTER — Observation Stay (HOSPITAL_COMMUNITY): Payer: Medicare Other

## 2014-11-10 DIAGNOSIS — G451 Carotid artery syndrome (hemispheric): Secondary | ICD-10-CM

## 2014-11-10 DIAGNOSIS — I10 Essential (primary) hypertension: Secondary | ICD-10-CM | POA: Diagnosis not present

## 2014-11-10 DIAGNOSIS — N183 Chronic kidney disease, stage 3 (moderate): Secondary | ICD-10-CM | POA: Diagnosis not present

## 2014-11-10 DIAGNOSIS — G459 Transient cerebral ischemic attack, unspecified: Secondary | ICD-10-CM | POA: Diagnosis not present

## 2014-11-10 DIAGNOSIS — G458 Other transient cerebral ischemic attacks and related syndromes: Secondary | ICD-10-CM | POA: Diagnosis not present

## 2014-11-10 DIAGNOSIS — N179 Acute kidney failure, unspecified: Secondary | ICD-10-CM

## 2014-11-10 DIAGNOSIS — E1039 Type 1 diabetes mellitus with other diabetic ophthalmic complication: Secondary | ICD-10-CM | POA: Diagnosis not present

## 2014-11-10 LAB — GLUCOSE, CAPILLARY
GLUCOSE-CAPILLARY: 105 mg/dL — AB (ref 65–99)
GLUCOSE-CAPILLARY: 125 mg/dL — AB (ref 65–99)
GLUCOSE-CAPILLARY: 126 mg/dL — AB (ref 65–99)
GLUCOSE-CAPILLARY: 96 mg/dL (ref 65–99)
Glucose-Capillary: 189 mg/dL — ABNORMAL HIGH (ref 65–99)
Glucose-Capillary: 157 mg/dL — ABNORMAL HIGH (ref 65–99)

## 2014-11-10 LAB — SODIUM, URINE, RANDOM: Sodium, Ur: 60 mmol/L

## 2014-11-10 LAB — LIPID PANEL
Cholesterol: 159 mg/dL (ref 0–200)
HDL: 31 mg/dL — ABNORMAL LOW (ref >40)
LDL Cholesterol: 106 mg/dL — ABNORMAL HIGH (ref 0–99)
TRIGLYCERIDES: 112 mg/dL (ref <150)
Total CHOL/HDL Ratio: 5.1 RATIO
VLDL: 22 mg/dL (ref 0–40)

## 2014-11-10 LAB — CREATININE, URINE, RANDOM: CREATININE, URINE: 81.58 mg/dL

## 2014-11-10 MED ORDER — INSULIN ASPART 100 UNIT/ML ~~LOC~~ SOLN
3.0000 [IU] | Freq: Three times a day (TID) | SUBCUTANEOUS | Status: DC
  Administered 2014-11-11 – 2014-11-13 (×6): 3 [IU] via SUBCUTANEOUS

## 2014-11-10 MED ORDER — INSULIN ASPART 100 UNIT/ML ~~LOC~~ SOLN
0.0000 [IU] | Freq: Three times a day (TID) | SUBCUTANEOUS | Status: DC
  Administered 2014-11-11 (×2): 2 [IU] via SUBCUTANEOUS
  Administered 2014-11-12: 3 [IU] via SUBCUTANEOUS
  Administered 2014-11-13: 2 [IU] via SUBCUTANEOUS
  Administered 2014-11-13: 1 [IU] via SUBCUTANEOUS

## 2014-11-10 MED ORDER — CLOPIDOGREL BISULFATE 75 MG PO TABS
75.0000 mg | ORAL_TABLET | Freq: Every day | ORAL | Status: DC
  Administered 2014-11-10 – 2014-11-13 (×4): 75 mg via ORAL
  Filled 2014-11-10 (×4): qty 1

## 2014-11-10 MED ORDER — FUROSEMIDE 10 MG/ML IJ SOLN
40.0000 mg | Freq: Two times a day (BID) | INTRAMUSCULAR | Status: DC
  Administered 2014-11-10 – 2014-11-11 (×3): 40 mg via INTRAVENOUS
  Filled 2014-11-10 (×3): qty 4

## 2014-11-10 NOTE — Progress Notes (Signed)
[facility] PROGRESS NOTE   Assessment/Plan: TIA (transient ischemic attack): HgbA1c pending, LDL > 70, HDL < 40 start statins. MRI, MRA of the brain  No acute infarct. Very mild small vessel disease type changes. PT consult, OT consult, Speech consult  pending Echocardiogram and  Carotid dopplers pending Prophylactic therapy-Antiplatelet [id number] have to change to Plavix Risk factor modification  Cardiac Monitoring showed no events. Neurochecks q4h  Keep MAP 70 tobacco counseling cessation.  AKI on CKD (chronic kidney disease) stage 3, GFR 30-59 ml/min - Unclear etiology, check U NA and U Cr. - hold a ACE-I.  DM (diabetes mellitus), type 1, uncontrolled w/ophthalmic complication - [id number] pending. checnge BCBG's to ACHS.  Hyperlipidemia: - statin  Essential hypertension - allow permissive HTN.  Chronic diastolic heart failure: - start lasix IV   Code Status: full Family Communication: none  Disposition Plan: inpatient   Consultants:  neuro  Procedures:  MRI  carotid  Antibiotics:  None  HPI/Subjective: Relates his weakness is resolved.  Objective: Filed Vitals:   11/09/14 2300 11/10/14 0300 11/10/14 0500 11/10/14 0700  BP: 186/79 135/69 141/69 141/59  Pulse: 89 74 69 62  Temp: 98.1 F (36.7 C) 98.2 F (36.8 C) 98.2 F (36.8 C) 98.2 F (36.8 C)  TempSrc: Oral Oral Oral Oral  Resp: 16 14 14 12   Height:      Weight:      SpO2: 99% 98% 100% 99%    Intake/Output Summary (Last 24 hours) at 11/10/14 1046 Last data filed at 11/10/14 0655  Gross per 24 hour  Intake      0 ml  Output    350 ml  Net   -350 ml   Filed Weights   11/09/14 1559 11/09/14 2109  Weight: 77.111 kg (170 lb) 76.9 kg (169 lb 8.5 oz)    Exam:  General: Alert, awake, oriented x3, in no acute distress.  HEENT: No bruits, no goiter.  Heart: Regular rate and rhythm, without murmurs, rubs, gallops.  Lungs: Good air movement, bilateral air movement.  Abdomen: Soft,  nontender, nondistended, positive bowel sounds.  Neuro: Grossly intact, nonfocal.   Data Reviewed: Basic Metabolic Panel:  Recent Labs Lab 11/09/14 1634  NA 135  K 3.8  CL 103  CO2 25  GLUCOSE 100*  BUN 29*  CREATININE 1.71*  CALCIUM 8.8*   Liver Function Tests:  Recent Labs Lab 11/09/14 1634  AST 19  ALT 16*  ALKPHOS 120  BILITOT 0.4  PROT 6.8  ALBUMIN 3.1*   No results for input(s): LIPASE, AMYLASE in the last 168 hours. No results for input(s): AMMONIA in the last 168 hours. CBC:  Recent Labs Lab 11/09/14 1634  WBC 4.2  NEUTROABS 2.7  HGB 10.7*  HCT 31.7*  MCV 98.4  PLT 150   Cardiac Enzymes: No results for input(s): CKTOTAL, CKMB, CKMBINDEX, TROPONINI in the last 168 hours. BNP (last 3 results) No results for input(s): BNP in the last 8760 hours.  ProBNP (last 3 results) No results for input(s): PROBNP in the last 8760 hours.  CBG:  Recent Labs Lab 11/09/14 2154 11/10/14 0006 11/10/14 0406 11/10/14 0830  GLUCAP 68 126* 96 105*    No results found for this or any previous visit (from the past 240 hour(s)).   Studies: Dg Chest 2 View  11/10/2014   CLINICAL DATA:  Transient ischemic attack, acute onset. Initial encounter.  EXAM: CHEST  2 VIEW  COMPARISON:  Chest radiograph performed 04/11/2014  FINDINGS: The lungs  are well-aerated. Mild scarring is noted at the left upper lung zone. There is no evidence of pleural effusion or pneumothorax.  The heart is normal in size; the mediastinal contour is within normal limits. No acute osseous abnormalities are seen.  IMPRESSION: Mild scarring at the left upper lung zone.  Lungs otherwise clear.   Electronically Signed   By: Garald Balding M.D.   On: 11/10/2014 03:02   Ct Head Wo Contrast  11/09/2014   CLINICAL DATA:  81 year old male with slurred speech and right leg numbness  EXAM: CT HEAD WITHOUT CONTRAST  TECHNIQUE: Contiguous axial images were obtained from the base of the skull through the vertex  without intravenous contrast.  COMPARISON:  CT dated 04/11/2014  FINDINGS: The ventricles are dilated and the sulci are prominent compatible with age-related atrophy. Periventricular and deep white matter hypodensities represent chronic microvascular ischemic changes. There is no intracranial hemorrhage. No mass effect or midline shift identified.  The visualized paranasal sinuses and mastoid air cells are well aerated. The calvarium is intact.  IMPRESSION: No acute intracranial pathology.  Age-related atrophy and chronic microvascular ischemic disease.  If symptoms persist and there are no contraindications, MRI may provide better evaluation if clinically indicated.   Electronically Signed   By: Anner Crete M.D.   On: 11/09/2014 17:19   Mr Brain Wo Contrast  11/09/2014   CLINICAL DATA:  81 year old hypertensive diabetic male with speech difficulty. Subsequent encounter.  EXAM: MRI HEAD WITHOUT CONTRAST  TECHNIQUE: Multiplanar, multiecho pulse sequences of the brain and surrounding structures were obtained without intravenous contrast.  COMPARISON:  11/09/2014 CT.  02/09/2012 MR.  FINDINGS: No acute infarct.  No intracranial hemorrhage.  Very mild small vessel disease type changes.  Global atrophy. Ventricular prominence felt to be related to atrophy rather than hydrocephalus.  No intracranial mass lesion noted on this unenhanced exam.  Major intracranial vascular structures are patent.  Prominent calcification of the falx and tentorium.  Exophthalmos.  Post left lens replacement.  Complete opacification left mastoid air cells and left middle ear cavity. No obstructing lesion of the eustachian tube noted. Partial opacification of the petrous apex bilaterally.  Mild spinal stenosis C3-4. Mild transverse ligament hypertrophy. Cervical medullary junction unremarkable.  Partially empty sella.  Pineal region unremarkable.  IMPRESSION: No acute infarct.  Very mild small vessel disease type changes.  Global  atrophy.  Complete opacification left mastoid air cells and left middle ear cavity. No obstructing lesion of the eustachian tube noted. Partial opacification of the new petrous apex bilaterally.   Electronically Signed   By: Genia Del M.D.   On: 11/09/2014 21:07   Mr Jodene Nam Head/brain Wo Cm  11/10/2014   CLINICAL DATA:  Initial evaluation for acute slurred speech.  EXAM: MRA HEAD WITHOUT CONTRAST  TECHNIQUE: Angiographic images of the Circle of Willis were obtained using MRA technique without intravenous contrast.  COMPARISON:  None.  FINDINGS: ANTERIOR CIRCULATION:  Study is degraded by motion artifact.  Visualized distal cervical segments of the internal carotid arteries are widely patent with antegrade flow. The petrous, cavernous, and supra clinoid segments are widely patent. A1 segments, anterior communicating artery, and anterior cerebral arteries well opacified bilaterally. M1 segments widely patent without stenosis or occlusion. MCA bifurcations within normal limits. Distal MCA branches demonstrate multi focal atheromatous irregularity, but are fairly symmetric bilaterally.  POSTERIOR CIRCULATION:  Vertebral arteries are widely patent to the vertebrobasilar junction. The right vertebral artery is slightly dominant. Posterior inferior cerebellar arteries patent proximally. Basilar  artery widely patent. Right anterior inferior cerebral artery is dominant. Superior cerebellar arteries well opacified bilaterally. Both posterior cerebral arteries arise from the basilar artery. Posterior cerebral arteries are opacified to their distal aspects. Apparent signal loss within the distal right P2 segment felt to be artifactual in nature due to motion artifact through this area on this exam. Distal branch atheromatous irregularity within the PCA branches bilaterally.  No aneurysm or vascular malformation.  IMPRESSION: 1. No large or proximal arterial branch occlusion identified within the intracranial circulation. No  hemodynamically significant or correctable stenosis identified. 2. Atheromatous irregularity within the distal posterior cerebral arteries and distal MCA branches.   Electronically Signed   By: Jeannine Boga M.D.   On: 11/10/2014 01:40    Scheduled Meds: . aspirin EC  81 mg Oral Daily  . calcitRIOL  0.25 mcg Oral Daily  . cholecalciferol  1,000 Units Oral Daily  . docusate sodium  100 mg Oral Daily  . donepezil  10 mg Oral QHS  . enoxaparin (LOVENOX) injection  30 mg Subcutaneous Q24H  . fluticasone  1 spray Each Nare Daily  . furosemide  20 mg Oral BID  . insulin aspart  0-9 Units Subcutaneous 6 times per day  . insulin glargine  10 Units Subcutaneous Daily  . loratadine  10 mg Oral Q lunch  . pantoprazole  40 mg Oral Daily  . phenytoin  300 mg Oral QHS  . senna  1 tablet Oral Daily   Continuous Infusions:   Time Spent: 25 min   Charlynne Cousins  [facility] Pager [phone]. If 7PM-7AM, please contact night-coverage at [email or web address], password [facility] 11/10/2014, 10:46 AM

## 2014-11-10 NOTE — Consult Note (Signed)
Consult Reason for Consult: dysarthria Referring Physician: Dr Olevia Bowens  CC: dysarthria  HPI: Eric [name] is an 81 y.o. male hx of HTN, DM, HLD, CHF, dementia presenting with transient episode of dysarthria. Friend noted garbled speech, patient reports knowing what he wanted to say but unable to get the words out. Also notes RLE weakness at that time. Symptoms lasted < 1 hour. No associated sensory or visual deficits.   CT head and MRI imaging reviewed showed no acute process. Carotid doppler shows 1-39% bilateral .   Last seen well: 7/23 at 1430. tPA not given due to resolution of symptoms.   Past Medical History  Diagnosis Date  . ANEMIA-NOS 02/17/2007  . DEPRESSION 12/25/2008  . HYPERTENSION 02/17/2007  . GERD 02/17/2007  . OSTEOARTHRITIS 03/04/2010  . DIABETES MELLITUS, TYPE I, CONTROLLED, WITH RETINOPATHY   . Gastroparesis   . BENIGN PROSTATIC HYPERTROPHY   . VITAMIN B12 DEFICIENCY   . BACK PAIN, LUMBAR   . LIVER DISORDER   . PSA, INCREASED   . Hyperlipidemia   . Diabetes mellitus   . Pneumonia   . Anxiety   . Encephalopathy, metabolic   . Altered mental status   . CAP (community acquired pneumonia)   . Adult failure to thrive   . Hyponatremia   . CKD (chronic kidney disease)   . Bilateral leg edema     Past Surgical History  Procedure Laterality Date  . Transurethral resection of prostate    . Left heart catheterization with coronary angiogram N/A 04/23/2011    Procedure: LEFT HEART CATHETERIZATION WITH CORONARY ANGIOGRAM;  Surgeon: Kearney Evitt M Martinique, MD;  Location: [facility] CATH LAB;  Service: Cardiovascular;  Laterality: N/A;    Family History  Problem Relation Age of Onset  . Cancer Father     had uncertain type of cancer  . Cancer Brother     Prostate Cancer  . Cancer Brother     Prostate Cancer  . Diabetes Other     Social History:  reports that he has never smoked. He has never used smokeless tobacco. He reports that he does not drink alcohol or use illicit  drugs.  Allergies  Allergen Reactions  . Percocet [Oxycodone-Acetaminophen] Rash    Medications:  Scheduled: . calcitRIOL  0.25 mcg Oral Daily  . cholecalciferol  1,000 Units Oral Daily  . clopidogrel  75 mg Oral Daily  . docusate sodium  100 mg Oral Daily  . donepezil  10 mg Oral QHS  . enoxaparin (LOVENOX) injection  30 mg Subcutaneous Q24H  . fluticasone  1 spray Each Nare Daily  . furosemide  40 mg Intravenous BID  . insulin aspart  0-9 Units Subcutaneous TID WC  . insulin aspart  3 Units Subcutaneous TID WC  . insulin glargine  10 Units Subcutaneous Daily  . loratadine  10 mg Oral Q lunch  . pantoprazole  40 mg Oral Daily  . phenytoin  300 mg Oral QHS  . senna  1 tablet Oral Daily    ROS: Out of a complete 14 system review, the patient complains of only the following symptoms, and all other reviewed systems are negative. + speech difficulties  Physical Examination: Filed Vitals:   11/10/14 1658  BP: 150/64  Pulse: 70  Temp: 97.7 F (36.5 C)  Resp: 16   Physical Exam  Constitutional: He appears well-developed and well-nourished.  Psych: Affect appropriate to situation Eyes: No scleral injection HENT: No OP obstrucion Head: Normocephalic.  Cardiovascular: Normal rate and  regular rhythm.  Respiratory: Effort normal and breath sounds normal.  GI: Soft. Bowel sounds are normal. No distension. There is no tenderness.  Skin: WDI  Neurologic Examination Mental Status: Alert, oriented, thought content appropriate.  Speech fluent without evidence of aphasia.  Able to follow 3 step commands without difficulty. Cranial Nerves: II: funduscopic exam wnl bilaterally, visual fields grossly normal, pupils equal, round, reactive to light and accommodation III,IV, VI: ptosis not present, extra-ocular motions intact bilaterally V,VII: smile symmetric, facial light touch sensation normal bilaterally VIII: hearing normal bilaterally IX,X: gag reflex present XI:  trapezius strength/neck flexion strength normal bilaterally XII: tongue strength normal  Motor: Right : Upper extremity    Left:     Upper extremity 5/5 deltoid       5/5 deltoid 5/5 biceps      5/5 biceps  5/5 triceps      5/5 triceps 5/5 hand grip      5/5 hand grip  Lower extremity     Lower extremity 5/5 hip flexor      5/5 hip flexor 5/5 quadricep      5/5 quadriceps  5/5 hamstrings     5/5 hamstrings 5/5 plantar flexion       5/5 plantar flexion 5/5 plantar extension     5/5 plantar extension Tone and bulk:normal tone throughout; no atrophy noted Sensory: Pinprick and light touch intact throughout, bilaterally Deep Tendon Reflexes: 2+ and symmetric throughout Plantars: Right: downgoing   Left: downgoing Cerebellar: normal finger-to-nose, and normal heel-to-shin test Gait: deferred  Laboratory Studies:   Basic Metabolic Panel:  Recent Labs Lab 11/09/14 1634  NA 135  K 3.8  CL 103  CO2 25  GLUCOSE 100*  BUN 29*  CREATININE 1.71*  CALCIUM 8.8*    Liver Function Tests:  Recent Labs Lab 11/09/14 1634  AST 19  ALT 16*  ALKPHOS 120  BILITOT 0.4  PROT 6.8  ALBUMIN 3.1*   No results for input(s): LIPASE, AMYLASE in the last 168 hours. No results for input(s): AMMONIA in the last 168 hours.  CBC:  Recent Labs Lab 11/09/14 1634  WBC 4.2  NEUTROABS 2.7  HGB 10.7*  HCT 31.7*  MCV 98.4  PLT 150    Cardiac Enzymes: No results for input(s): CKTOTAL, CKMB, CKMBINDEX, TROPONINI in the last 168 hours.  BNP: Invalid input(s): POCBNP  CBG:  Recent Labs Lab 11/10/14 0006 11/10/14 0406 11/10/14 0830 11/10/14 1117 11/10/14 1623  GLUCAP 126* 96 105* 189* 125*    Microbiology: Results for orders placed or performed during the hospital encounter of 04/11/14  Urine culture     Status: None   Collection Time: 04/11/14  8:47 PM  Result Value Ref Range Status   Specimen Description URINE, RANDOM  Final   Special Requests NONE  Final   Colony Count NO  GROWTH Performed at [facility]   Final   Culture NO GROWTH Performed at [facility]   Final   Report Status 04/13/2014 FINAL  Final    Coagulation Studies:  Recent Labs  11/09/14 1634  LABPROT 14.3  INR 1.09    Urinalysis:  Recent Labs Lab 11/09/14 1717  COLORURINE YELLOW  LABSPEC 1.011  PHURINE 6.5  GLUCOSEU NEGATIVE  HGBUR NEGATIVE  BILIRUBINUR NEGATIVE  KETONESUR NEGATIVE  PROTEINUR NEGATIVE  UROBILINOGEN 0.2  NITRITE NEGATIVE  LEUKOCYTESUR NEGATIVE    Lipid Panel:     Component Value Date/Time   CHOL 159 11/10/2014 0855   TRIG 112 11/10/2014  0855   HDL 31* 11/10/2014 0855   CHOLHDL 5.1 11/10/2014 0855   VLDL 22 11/10/2014 0855   LDLCALC 106* 11/10/2014 0855    HgbA1C:  Lab Results  Component Value Date   HGBA1C 10.6* 10/20/2011    Urine Drug Screen:     Component Value Date/Time   LABOPIA NONE DETECTED 11/09/2014 1717   COCAINSCRNUR NONE DETECTED 11/09/2014 1717   LABBENZ NONE DETECTED 11/09/2014 1717   AMPHETMU NONE DETECTED 11/09/2014 1717   THCU NONE DETECTED 11/09/2014 1717   LABBARB NONE DETECTED 11/09/2014 1717    Alcohol Level:  Recent Labs Lab 11/09/14 Arthur <5     Imaging: Dg Chest 2 View  11/10/2014   CLINICAL DATA:  Transient ischemic attack, acute onset. Initial encounter.  EXAM: CHEST  2 VIEW  COMPARISON:  Chest radiograph performed 04/11/2014  FINDINGS: The lungs are well-aerated. Mild scarring is noted at the left upper lung zone. There is no evidence of pleural effusion or pneumothorax.  The heart is normal in size; the mediastinal contour is within normal limits. No acute osseous abnormalities are seen.  IMPRESSION: Mild scarring at the left upper lung zone.  Lungs otherwise clear.   Electronically Signed   By: Garald Balding M.D.   On: 11/10/2014 03:02   Ct Head Wo Contrast  11/09/2014   CLINICAL DATA:  81 year old male with slurred speech and right leg numbness  EXAM: CT HEAD WITHOUT CONTRAST   TECHNIQUE: Contiguous axial images were obtained from the base of the skull through the vertex without intravenous contrast.  COMPARISON:  CT dated 04/11/2014  FINDINGS: The ventricles are dilated and the sulci are prominent compatible with age-related atrophy. Periventricular and deep white matter hypodensities represent chronic microvascular ischemic changes. There is no intracranial hemorrhage. No mass effect or midline shift identified.  The visualized paranasal sinuses and mastoid air cells are well aerated. The calvarium is intact.  IMPRESSION: No acute intracranial pathology.  Age-related atrophy and chronic microvascular ischemic disease.  If symptoms persist and there are no contraindications, MRI may provide better evaluation if clinically indicated.   Electronically Signed   By: Anner Crete M.D.   On: 11/09/2014 17:19   Mr Brain Wo Contrast  11/09/2014   CLINICAL DATA:  81 year old hypertensive diabetic male with speech difficulty. Subsequent encounter.  EXAM: MRI HEAD WITHOUT CONTRAST  TECHNIQUE: Multiplanar, multiecho pulse sequences of the brain and surrounding structures were obtained without intravenous contrast.  COMPARISON:  11/09/2014 CT.  02/09/2012 MR.  FINDINGS: No acute infarct.  No intracranial hemorrhage.  Very mild small vessel disease type changes.  Global atrophy. Ventricular prominence felt to be related to atrophy rather than hydrocephalus.  No intracranial mass lesion noted on this unenhanced exam.  Major intracranial vascular structures are patent.  Prominent calcification of the falx and tentorium.  Exophthalmos.  Post left lens replacement.  Complete opacification left mastoid air cells and left middle ear cavity. No obstructing lesion of the eustachian tube noted. Partial opacification of the petrous apex bilaterally.  Mild spinal stenosis C3-4. Mild transverse ligament hypertrophy. Cervical medullary junction unremarkable.  Partially empty sella.  Pineal region  unremarkable.  IMPRESSION: No acute infarct.  Very mild small vessel disease type changes.  Global atrophy.  Complete opacification left mastoid air cells and left middle ear cavity. No obstructing lesion of the eustachian tube noted. Partial opacification of the new petrous apex bilaterally.   Electronically Signed   By: Genia Del M.D.   On: 11/09/2014  21:07   Mr Jodene Nam Head/brain Wo Cm  11/10/2014   CLINICAL DATA:  Initial evaluation for acute slurred speech.  EXAM: MRA HEAD WITHOUT CONTRAST  TECHNIQUE: Angiographic images of the Circle of Willis were obtained using MRA technique without intravenous contrast.  COMPARISON:  None.  FINDINGS: ANTERIOR CIRCULATION:  Study is degraded by motion artifact.  Visualized distal cervical segments of the internal carotid arteries are widely patent with antegrade flow. The petrous, cavernous, and supra clinoid segments are widely patent. A1 segments, anterior communicating artery, and anterior cerebral arteries well opacified bilaterally. M1 segments widely patent without stenosis or occlusion. MCA bifurcations within normal limits. Distal MCA branches demonstrate multi focal atheromatous irregularity, but are fairly symmetric bilaterally.  POSTERIOR CIRCULATION:  Vertebral arteries are widely patent to the vertebrobasilar junction. The right vertebral artery is slightly dominant. Posterior inferior cerebellar arteries patent proximally. Basilar artery widely patent. Right anterior inferior cerebral artery is dominant. Superior cerebellar arteries well opacified bilaterally. Both posterior cerebral arteries arise from the basilar artery. Posterior cerebral arteries are opacified to their distal aspects. Apparent signal loss within the distal right P2 segment felt to be artifactual in nature due to motion artifact through this area on this exam. Distal branch atheromatous irregularity within the PCA branches bilaterally.  No aneurysm or vascular malformation.  IMPRESSION:  1. No large or proximal arterial branch occlusion identified within the intracranial circulation. No hemodynamically significant or correctable stenosis identified. 2. Atheromatous irregularity within the distal posterior cerebral arteries and distal MCA branches.   Electronically Signed   By: Jeannine Boga M.D.   On: 11/10/2014 01:40     Assessment/Plan:  81y/o hx of HTN, DM, HLD, CHF presenting with transient episode of speech difficulty and RLE weakness. Currently asymptomatic. Concern for possible TIA.  1. HgbA1c, fasting lipid panel 2. MRI, MRA  of the brain without contrast completed 3. Frequent neuro checks 4. Echocardiogram 5. Carotid dopplers completed 6. Prophylactic therapy-Antiplatelet med: Plavix 75mg  daily 7. Risk factor modification 8. Telemetry monitoring 9. PT consult, OT consult, Speech consult 10. NPO until RN stroke swallow screen   Jim Like, DO [facility] [phone]  If 7pm- 7am, please page neurology on call as listed in [place]. 11/10/2014, 7:10 PM

## 2014-11-10 NOTE — Progress Notes (Signed)
*  PRELIMINARY RESULTS* Vascular Ultrasound Carotid Duplex (Doppler) has been completed.  Preliminary findings: Bilateral:  1-39% ICA stenosis.  Vertebral artery flow is antegrade.     Landry Mellow, RDMS, RVT  11/10/2014, 2:33 PM

## 2014-11-10 NOTE — Progress Notes (Signed)
Pt had not eaten all day while in ED, when CBG checked it was low at 68. Pt given something to eat and orange juice. CBG rechecked to be 126. Insulin held at this time. Will continue to monitor.

## 2014-11-10 NOTE — Evaluation (Signed)
Physical Therapy Evaluation Patient Details Name: JDYN RESENDIZ MRN: [id number] DOB: 08-Jun-1923 Today's Date: 11/10/2014   History of Present Illness  Pt. is an 81 year old male who had dysarthria and R LE weakness. Pt. symptoms resolved by the time EMS arrived. Pt. admitted for TIA workup. Pt. PMH: DM, HTN, CKD, CHF, dementia.   Clinical Impression  Patient presents with generalized weakness most likely due to being in hospital and laying in bed and reports other symptoms of weakness/speech deficits have resolved. Pt does live alone but has an aide assist with self care and IADLs 6 days/week. Pt seems to be functioning close to baseline however will follow up 1 additional visit to perform some higher level balance challenges prior to return home to ensure safety as pt does report 1 fall 3-4 months ago. Would benefit from HHPT to maximize independence and mobility and minimize fall risk.     Follow Up Recommendations Home health PT;Supervision - Intermittent    Equipment Recommendations  None recommended by PT    Recommendations for Other Services       Precautions / Restrictions Precautions Precautions: Fall Restrictions Weight Bearing Restrictions: No      Mobility  Bed Mobility Overal bed mobility: Modified Independent                Transfers Overall transfer level: Modified independent Equipment used: Rolling walker (2 wheeled)             General transfer comment: Stood from EOB x1. No sway noted.   Ambulation/Gait Ambulation/Gait assistance: Supervision Ambulation Distance (Feet): 150 Feet Assistive device: Rolling walker (2 wheeled) Gait Pattern/deviations: Step-through pattern;Decreased stride length;Trunk flexed   Gait velocity interpretation: Below normal speed for age/gender General Gait Details: Slow, guarded gait but steady.   Stairs            Wheelchair Mobility    Modified Rankin (Stroke Patients Only) Modified Rankin (Stroke  Patients Only) Pre-Morbid Rankin Score: Moderate disability Modified Rankin: Moderately severe disability     Balance Overall balance assessment: Needs assistance Sitting-balance support: Feet supported;No upper extremity supported Sitting balance-Leahy Scale: Good     Standing balance support: During functional activity Standing balance-Leahy Scale: Poor Standing balance comment: Relient on RW for support.                              Pertinent Vitals/Pain Pain Assessment: 0-10 Pain Score: 1  Pain Location: BLEs Pain Descriptors / Indicators: Sore Pain Intervention(s): Monitored during session;Repositioned    Home Living Family/patient expects to be discharged to:: Private residence Living Arrangements: Alone Available Help at Discharge: Available PRN/intermittently Type of Home: House Home Access: Stairs to enter Entrance Stairs-Rails: None Entrance Stairs-Number of Steps: 1 Home Layout: One level Home Equipment: Environmental consultant - 4 wheels;Cane - quad;Cane - single point;Shower seat      Prior Function Level of Independence: Independent with assistive device(s)         Comments: Aide 6 days/week - assists with meal prep, driving. Pt [facility] [employer] [facility]/week. Reports 1 fall 3-4 months ago after tripping over stool. Uses quad cane for household ambulation and rollator for community ambulation.     Hand Dominance   Dominant Hand: Right    Extremity/Trunk Assessment   Upper Extremity Assessment: Defer to OT evaluation           Lower Extremity Assessment: Generalized weakness;RLE deficits/detail;LLE deficits/detail RLE Deficits / Details: Swelling  noted distal to knee and soreness - premorbid LLE Deficits / Details: Swelling noted distal to knee and soreness- premorbid.     Communication   Communication: No difficulties  Cognition Arousal/Alertness: Awake/alert Behavior During Therapy: WFL for tasks assessed/performed Overall Cognitive Status:  History of cognitive impairments - at baseline (Able to state "18th year" "july)                      General Comments      Exercises        Assessment/Plan    PT Assessment Patient needs continued PT services  PT Diagnosis Generalized weakness   PT Problem List Decreased strength;Pain;Decreased cognition;Decreased balance;Decreased mobility  PT Treatment Interventions Balance training;Gait training;Functional mobility training;Therapeutic activities;Therapeutic exercise;Patient/family education;Stair training   PT Goals (Current goals can be found in the Care Plan section) Acute Rehab PT Goals Patient Stated Goal: to go home PT Goal Formulation: With patient Time For Goal Achievement: 11/24/14 Potential to Achieve Goals: Good    Frequency Min 3X/week   Barriers to discharge Decreased caregiver support Pt lives alone but does have an aide that comes in 6 days/week.    Co-evaluation               End of Session Equipment Utilized During Treatment: Gait belt Activity Tolerance: Patient tolerated treatment well Patient left: in bed;with call bell/phone within reach;with bed alarm set Nurse Communication: Mobility status    Functional Assessment Tool Used: Clinical judgment Functional Limitation: Mobility: Walking and moving around Mobility: Walking and Moving Around Current Status [id number]): At least 1 percent but less than 20 percent impaired, limited or restricted Mobility: Walking and Moving Around Goal Status [phone]): At least 1 percent but less than 20 percent impaired, limited or restricted    Time: 1040-1057 PT Time Calculation (min) (ACUTE ONLY): 17 min   Charges:   PT Evaluation $Initial PT Evaluation Tier I: 1 Procedure     PT G Codes:   PT G-Codes **NOT FOR INPATIENT CLASS** Functional Assessment Tool Used: Clinical judgment Functional Limitation: Mobility: Walking and moving around Mobility: Walking and Moving Around Current Status [id number]):  At least 1 percent but less than 20 percent impaired, limited or restricted Mobility: Walking and Moving Around Goal Status [phone]): At least 1 percent but less than 20 percent impaired, limited or restricted    [place] 11/10/2014, 11:05 AM Wray Kearns, PT, DPT [phone]

## 2014-11-10 NOTE — Evaluation (Signed)
Occupational Therapy Evaluation Patient Details Name: ELICEO MOGUL MRN: [id number] DOB: April 09, 1924 Today's Date: 11/10/2014    History of Present Illness Pt. is an 81 year old male who had dysarthria and R LE weakness. Pt. symptoms resolved by the time EMS arrived. Pt. admitted for TIA workup. Pt. PMH: DM, HTN, CKD, CHF, dementia.    Clinical Impression    Pt. Was able to perform ADLs at baseline Mod I level without LOB noted. Pt. States he feels weak for the past several days but was able to perform all tasks that were requested. Pt. States he has an aide that comes in the morning and stays till lunch and another aide that assists with dinner. Pt. Does not present with any acute OT needs and will be d/c from services.   Follow Up Recommendations  No OT follow up    Equipment Recommendations       Recommendations for Other Services       Precautions / Restrictions Precautions Precautions: None Restrictions Weight Bearing Restrictions: No      Mobility Bed Mobility Overal bed mobility: Independent                Transfers Overall transfer level: Modified independent Equipment used: Rolling walker (2 wheeled)                  Balance                                            ADL Overall ADL's : At baseline                                       General ADL Comments: Pt. is able to perform ADLs at I to Mod I level.      Vision     Perception     Praxis      Pertinent Vitals/Pain Pain Assessment: 0-10 Pain Score: 1  Pain Location:  (legs) Pain Descriptors / Indicators: Aching     Hand Dominance Right   Extremity/Trunk Assessment Upper Extremity Assessment Upper Extremity Assessment: Overall WFL for tasks assessed           Communication Communication Communication: No difficulties   Cognition Arousal/Alertness: Awake/alert Behavior During Therapy: WFL for tasks assessed/performed Overall  Cognitive Status: History of cognitive impairments - at baseline                     General Comments       Exercises       Shoulder Instructions      Home Living Family/patient expects to be discharged to:: Private residence Living Arrangements: Alone Available Help at Discharge: Available PRN/intermittently Type of Home: House Home Access: Level entry     Home Layout: One level     Bathroom Shower/Tub: Tub/shower unit;Curtain Shower/tub characteristics: Architectural technologist: Standard Bathroom Accessibility: Yes How Accessible: Accessible via walker Home Equipment: [place] - 4 wheels;Cane - quad;Cane - single point;Shower seat          Prior Functioning/Environment Level of Independence: Independent with assistive device(s)        Comments: Pt. does have an aid to prepare all meals.     OT Diagnosis:     OT Problem List:     OT  Treatment/Interventions:      OT Goals(Current goals can be found in the care plan section) Acute Rehab OT Goals Patient Stated Goal:  (go home)  OT Frequency:     Barriers to D/C:            Co-evaluation              End of Session Equipment Utilized During Treatment: Rolling walker  Activity Tolerance: Patient tolerated treatment well Patient left: in bed;with call bell/phone within reach;with bed alarm set   Time: [id number] OT Time Calculation (min): 45 min Charges:  OT General Charges $OT Visit: 1 Procedure OT Evaluation $Initial OT Evaluation Tier I: 1 Procedure OT Treatments $Self Care/Home Management : 23-37 mins G-Codes: OT G-codes **NOT FOR INPATIENT CLASS** Functional Limitation: Self care Self Care Current Status [id number]): 0 percent impaired, limited or restricted Self Care Goal Status [id number]): 0 percent impaired, limited or restricted Self Care Discharge Status [id number]): 0 percent impaired, limited or restricted  Malley Hauter 11/10/2014, 10:44 AM

## 2014-11-11 ENCOUNTER — Ambulatory Visit (HOSPITAL_COMMUNITY): Payer: Medicare Other

## 2014-11-11 DIAGNOSIS — N183 Chronic kidney disease, stage 3 (moderate): Secondary | ICD-10-CM | POA: Diagnosis not present

## 2014-11-11 DIAGNOSIS — I10 Essential (primary) hypertension: Secondary | ICD-10-CM | POA: Diagnosis not present

## 2014-11-11 DIAGNOSIS — G459 Transient cerebral ischemic attack, unspecified: Secondary | ICD-10-CM

## 2014-11-11 DIAGNOSIS — E1039 Type 1 diabetes mellitus with other diabetic ophthalmic complication: Secondary | ICD-10-CM | POA: Diagnosis not present

## 2014-11-11 LAB — GLUCOSE, CAPILLARY
GLUCOSE-CAPILLARY: 165 mg/dL — AB (ref 65–99)
Glucose-Capillary: 92 mg/dL (ref 65–99)
Glucose-Capillary: 154 mg/dL — ABNORMAL HIGH (ref 65–99)
Glucose-Capillary: 182 mg/dL — ABNORMAL HIGH (ref 65–99)

## 2014-11-11 LAB — BASIC METABOLIC PANEL
ANION GAP: 8 (ref 5–15)
BUN: 30 mg/dL — ABNORMAL HIGH (ref 6–20)
CO2: 28 mmol/L (ref 22–32)
Calcium: 9.4 mg/dL (ref 8.9–10.3)
Chloride: 101 mmol/L (ref 101–111)
Creatinine, Ser: 1.72 mg/dL — ABNORMAL HIGH (ref 0.61–1.24)
GFR calc Af Amer: 39 mL/min — ABNORMAL LOW (ref >60)
GFR calc non Af Amer: 34 mL/min — ABNORMAL LOW (ref >60)
Glucose, Bld: 160 mg/dL — ABNORMAL HIGH (ref 65–99)
Potassium: 3.6 mmol/L (ref 3.5–5.1)
SODIUM: 137 mmol/L (ref 135–145)

## 2014-11-11 LAB — HEMOGLOBIN A1C
HEMOGLOBIN A1C: 6.5 % — AB (ref 4.8–5.6)
MEAN PLASMA GLUCOSE: 140 mg/dL

## 2014-11-11 MED ORDER — ATORVASTATIN CALCIUM 10 MG PO TABS
20.0000 mg | ORAL_TABLET | Freq: Every day | ORAL | Status: DC
  Administered 2014-11-11 – 2014-11-12 (×2): 20 mg via ORAL
  Filled 2014-11-11 (×2): qty 2

## 2014-11-11 NOTE — Evaluation (Signed)
Speech Language Pathology Evaluation Patient Details Name: Eric [name] MRN: [id number] DOB: [birth year] Today's Date: 11/11/2014 Time: 0950-1006 SLP Time Calculation (min) (ACUTE ONLY): 16 min  Problem List:  Patient Active Problem List   Diagnosis Date Noted  . TIA (transient ischemic attack) 11/09/2014  . Seizure 02/08/2012  . Lesion of lung 02/08/2012  . DM2 (diabetes mellitus, type 2) 02/08/2012  . Hypertensive encephalopathy 01/06/2012  . Hyperglycemia 01/06/2012  . MGUS (monoclonal gammopathy of unknown significance) 10/28/2011  . Bilateral leg edema 10/28/2011  . Fall at home 10/28/2011  . Failure to thrive in adult 10/28/2011  . Type 2 diabetes mellitus with hypoglycemia 10/27/2011  . Dysphagia 10/27/2011  . Encephalopathy 10/27/2011  . Hypernatremia 10/24/2011  . Altered mental status 10/20/2011  . Generalized weakness 10/18/2011  . Encephalopathy, metabolic   . Dependent edema 10/02/2011  . Hyponatremia 10/02/2011  . Pneumonia 04/24/2011  . CKD (chronic kidney disease) stage 3, GFR 30-59 ml/min 04/19/2011  . Bronchitis, acute 04/16/2011  . Edema 03/30/2011  . OSTEOARTHRITIS 03/04/2010  . LIVER DISORDER 12/09/2009  . BACK PAIN, LUMBAR 09/09/2009  . ABDOMINAL PAIN, UNSPECIFIED SITE 01/31/2009  . SYNCOPE 01/08/2009  . ELBOW PAIN, LEFT 12/30/2008  . ABNORMAL ELECTROCARDIOGRAM 12/30/2008  . DEPRESSION 12/25/2008  . WEIGHT LOSS 12/25/2008  . NOSEBLEED 12/25/2008  . VITAMIN B12 DEFICIENCY 10/24/2008  . DM (diabetes mellitus), type 1, uncontrolled w/ophthalmic complication [id number]  . PANCYTOPENIA 10/08/2008  . PSA, INCREASED 10/08/2008  . DYSURIA 10/03/2008  . LEG PAIN, BILATERAL 12/26/2007  . KNEE PAIN, LEFT 08/18/2007  . COUGH 04/18/2007  . Hyperlipidemia 02/17/2007  . ANEMIA-NOS 02/17/2007  . Essential hypertension 02/17/2007  . ALLERGIC RHINITIS 02/17/2007  . GERD 02/17/2007  . GASTROPARESIS 02/17/2007  . Benign prostatic hyperplasia 02/17/2007  .  HYPERURICEMIA 02/17/2007   Past Medical History:  Past Medical History  Diagnosis Date  . ANEMIA-NOS 02/17/2007  . DEPRESSION 12/25/2008  . HYPERTENSION 02/17/2007  . GERD 02/17/2007  . OSTEOARTHRITIS 03/04/2010  . DIABETES MELLITUS, TYPE I, CONTROLLED, WITH RETINOPATHY   . Gastroparesis   . BENIGN PROSTATIC HYPERTROPHY   . VITAMIN B12 DEFICIENCY   . BACK PAIN, LUMBAR   . LIVER DISORDER   . PSA, INCREASED   . Hyperlipidemia   . Diabetes mellitus   . Pneumonia   . Anxiety   . Encephalopathy, metabolic   . Altered mental status   . CAP (community acquired pneumonia)   . Adult failure to thrive   . Hyponatremia   . CKD (chronic kidney disease)   . Bilateral leg edema    Past Surgical History:  Past Surgical History  Procedure Laterality Date  . Transurethral resection of prostate    . Left heart catheterization with coronary angiogram N/A 04/23/2011    Procedure: LEFT HEART CATHETERIZATION WITH CORONARY ANGIOGRAM;  Surgeon: Peter M Martinique, MD;  Location: [facility] CATH LAB;  Service: Cardiovascular;  Laterality: N/A;   HPI:  Eric [name] is an 81 y.o. male hx of HTN, DM, HLD, CHF, dementia presenting with transient episode of dysarthria. Friend noted garbled speech, patient reports knowing what he wanted to say but unable to get the words out. Also notes RLE weakness at that time. Symptoms lasted < 1 hour. No associated sensory or visual deficits. MRI negative.    Assessment / Plan / Recommendation Clinical Impression  Pt presents with cognitive impairment consistent with dx of dementia. No findings concerning for aphasia or dysarthria. Speech is fluent and intelligible. Pt has assistance from family  and aide for cognitive tasks. No SLP interventions needed at this time, will sign off.     SLP Assessment  Patient does not need any further Speech Lanaguage Pathology Services    Follow Up Recommendations       Frequency and Duration        Pertinent Vitals/Pain  None   SLP  Goals     SLP Evaluation Prior Functioning  Cognitive/Linguistic Baseline: Baseline deficits Baseline deficit details: memory safety awareness, dementia Type of Home: House  Lives With: Alone (has caregiver) Available Help at Discharge: Family;Personal care attendant;Available PRN/intermittently   Cognition  Overall Cognitive Status: History of cognitive impairments - at baseline Arousal/Alertness: Awake/alert Orientation Level: Oriented X4 Attention: Focused;Sustained;Selective;Alternating Focused Attention: Appears intact Sustained Attention: Appears intact Selective Attention: Appears intact Alternating Attention: Appears intact Memory: Impaired Memory Impairment: Decreased recall of new information Awareness: Impaired Awareness Impairment: Emergent impairment Problem Solving: Impaired Problem Solving Impairment: Verbal complex;Functional complex Safety/Judgment: Impaired    Comprehension  Auditory Comprehension Overall Auditory Comprehension: Appears within functional limits for tasks assessed    Expression Verbal Expression Overall Verbal Expression: Appears within functional limits for tasks assessed   Oral / Motor Oral Motor/Sensory Function Overall Oral Motor/Sensory Function: Appears within functional limits for tasks assessed Motor Speech Overall Motor Speech: Appears within functional limits for tasks assessed   GO Functional Assessment Tool Used: clinical judgment Functional Limitations: Spoken language expressive Spoken Language Expression Current Status [id number]): 0 percent impaired, limited or restricted Spoken Language Expression Goal Status [id number]): 0 percent impaired, limited or restricted Spoken Language Expression Discharge Status [phone]): 0 percent impaired, limited or restricted   Laini Urick, Katherene Ponto 11/11/2014, 10:17 AM

## 2014-11-11 NOTE — Progress Notes (Signed)
STROKE TEAM PROGRESS NOTE   HISTORY Eric [name] is an 81 y.o. male hx of HTN, DM, HLD, CHF, dementia presenting with transient episode of dysarthria. Friend noted garbled speech, patient reports knowing what he wanted to say but unable to get the words out. Also notes RLE weakness at that time. Symptoms lasted < 1 hour. No associated sensory or visual deficits.   CT head and MRI imaging reviewed showed no acute process. Carotid doppler shows 1-39% bilateral .   Last seen well: 7/23 at 1430. tPA not given due to resolution of symptoms.    SUBJECTIVE (INTERVAL HISTORY) No family members present. The patient was working with physical therapy. He feels he is doing better. He reports that he is almost back to baseline. He has an aide at home 6 days per week.   OBJECTIVE Temp:  [97.7 F (36.5 C)-98.3 F (36.8 C)] 97.8 F (36.6 C) (07/25 0614) Pulse Rate:  [66-73] 66 (07/25 0614) Cardiac Rhythm:  [-] Heart block (07/24 1948) Resp:  [14-16] 16 (07/25 0614) BP: (150-191)/(64-90) 191/74 mmHg (07/25 0614) SpO2:  [99 %-100 %] 100 % (07/25 0614)   Recent Labs Lab 11/10/14 0830 11/10/14 1117 11/10/14 1623 11/10/14 2155 11/11/14 0641  GLUCAP 105* 189* 125* 157* 92    Recent Labs Lab 11/09/14 1634  NA 135  K 3.8  CL 103  CO2 25  GLUCOSE 100*  BUN 29*  CREATININE 1.71*  CALCIUM 8.8*    Recent Labs Lab 11/09/14 1634  AST 19  ALT 16*  ALKPHOS 120  BILITOT 0.4  PROT 6.8  ALBUMIN 3.1*    Recent Labs Lab 11/09/14 1634  WBC 4.2  NEUTROABS 2.7  HGB 10.7*  HCT 31.7*  MCV 98.4  PLT 150   No results for input(s): CKTOTAL, CKMB, CKMBINDEX, TROPONINI in the last 168 hours.  Recent Labs  11/09/14 1634  LABPROT 14.3  INR 1.09    Recent Labs  11/09/14 1717  COLORURINE YELLOW  LABSPEC 1.011  PHURINE 6.5  GLUCOSEU NEGATIVE  HGBUR NEGATIVE  BILIRUBINUR NEGATIVE  KETONESUR NEGATIVE  PROTEINUR NEGATIVE  UROBILINOGEN 0.2  NITRITE NEGATIVE  LEUKOCYTESUR  NEGATIVE       Component Value Date/Time   CHOL 159 11/10/2014 0855   TRIG 112 11/10/2014 0855   HDL 31* 11/10/2014 0855   CHOLHDL 5.1 11/10/2014 0855   VLDL 22 11/10/2014 0855   LDLCALC 106* 11/10/2014 0855   Lab Results  Component Value Date   HGBA1C 10.6* 10/20/2011      Component Value Date/Time   LABOPIA NONE DETECTED 11/09/2014 1717   COCAINSCRNUR NONE DETECTED 11/09/2014 1717   LABBENZ NONE DETECTED 11/09/2014 1717   AMPHETMU NONE DETECTED 11/09/2014 1717   THCU NONE DETECTED 11/09/2014 1717   LABBARB NONE DETECTED 11/09/2014 1717     Recent Labs Lab 11/09/14 [place] <5      Imaging   Dg Chest 2 View 11/10/2014    Mild scarring at the left upper lung zone.  Lungs otherwise clear.     Ct Head Wo Contrast 11/09/2014    No acute intracranial pathology.  Age-related atrophy and chronic microvascular ischemic disease.  If symptoms persist and there are no contraindications, MRI may provide better evaluation if clinically indicated.      Mr Brain Wo Contrast 11/09/2014    No acute infarct.  Very mild small vessel disease type changes.  Global atrophy.  Complete opacification left mastoid air cells and left middle ear cavity. No obstructing  lesion of the eustachian tube noted. Partial opacification of the new petrous apex bilaterally.       Mr Eric [name] Head/brain Wo Cm 11/10/2014    1. No large or proximal arterial branch occlusion identified within the intracranial circulation. No hemodynamically significant or correctable stenosis identified.  2. Atheromatous irregularity within the distal posterior cerebral arteries and distal MCA branches.        PHYSICAL EXAM Pleasant elderly Caucasian male currently not in distress. . Afebrile. Head is nontraumatic. Neck is supple without bruit.    Cardiac exam no murmur or gallop. Lungs are clear to auscultation. Distal pulses are well felt. Neurological Exam ;  Awake  Alert oriented x 3. Normal speech and language.eye  movements full without nystagmus.fundi were not visualized. Vision acuity and fields appear normal. Hearing is normal. Palatal movements are normal. Face symmetric. Tongue midline. Normal strength, tone, reflexes and coordination. Normal sensation. Gait deferred.   ASSESSMENT/PLAN [name] is a 81 y.o. male with history of HTN, DM, HLD, CHF, dementia  presenting with transient dysarthria and right lower extremity weakness. He did not receive IV t-PA due to resolution of deficits.   TIA:  Dominant secondary to small vessel disease.  Resultant  resolution of deficits  MRI  no acute infarct  MRA  no significant stenosis  Carotid Doppler Bilateral: 1-39% ICA stenosis. Vertebral artery flow is antegrade.  2D Echo  pending  LDL 106  HgbA1c 6.5  Lovenox for VTE prophylaxis  Diet heart healthy/carb modified Room service appropriate?: Yes; Fluid consistency:: Thin  aspirin 81 mg orally every day prior to admission, now on clopidogrel 75 mg orally every day  Ongoing aggressive stroke risk factor management  Therapy recommendations:  Home health physical therapy recommended  Disposition: Pending  Hypertension  Home meds: Hydralazine, lisinopril, and metoprolol  Stable   Hyperlipidemia  Home meds:  No lipid lowering medications prior to admission.  LDL 106, goal < 70  Add Lipitor 20 mg daily  Continue statin at discharge  Diabetes  HgbA1c 6.5, goal < 7.0  Controlled  Other Stroke Risk Factors  Advanced age   Other Active Problems  Renal insufficiency  Anemia  PLAN  Discharge home workup is complete  Follow-up with Dr. Leonie Man in 2 months  Hospital day #   Mikey Bussing PA-C [facility] Pager [phone] 11/11/2014, 8:51 AM I have personally examined this patient, reviewed notes, independently viewed imaging studies, participated in medical decision making and plan of care. I have made any additions or clarifications  directly to the above note. Agree with note above.  He presented with transient dysarthria and right-sided weakness likely left hemispheric TIA and remains at risk for neurological worsening, recurrent stroke, TIA and needs ongoing stroke evaluation and aggressive risk factor modification. Change aspirin to Plavix for secondary stroke prevention Antony Contras, MD Medical Director [facility] Pager: [phone] 11/11/2014 3:31 PM     To contact Stroke Continuity provider, please refer to [email or web address] After hours, contact General Neurology

## 2014-11-11 NOTE — Progress Notes (Signed)
Physical Therapy Treatment Patient Details Name: Eric [name] MRN: [id number] DOB: 16-Feb-1924 Today's Date: 18-Nov-2014    History of Present Illness Pt. is an 81 year old male who had dysarthria and R LE weakness. Pt. symptoms resolved by the time EMS arrived. Pt. admitted for TIA workup. Pt. PMH: DM, HTN, CKD, CHF, dementia.     PT Comments    Pt is progressing with ambulation. Was able to ambulate 3 stairs with min G for safety. Pt tends to drift when walking and will bump into objects if not corrected. Continue to recommend HHPT to increase functional independence.  Follow Up Recommendations  Home health PT;Supervision - Intermittent     Equipment Recommendations  None recommended by PT    Recommendations for Other Services       Precautions / Restrictions Precautions Precautions: Fall Restrictions Weight Bearing Restrictions: No    Mobility  Bed Mobility Overal bed mobility: Modified Independent                Transfers Overall transfer level: Needs assistance Equipment used: Rolling walker (2 wheeled) Transfers: Sit to/from Stand Sit to Stand: Supervision         General transfer comment: Supervision for safety.  Ambulation/Gait Ambulation/Gait assistance: Supervision Ambulation Distance (Feet): 200 Feet Assistive device: Rolling walker (2 wheeled) Gait Pattern/deviations: Step-through pattern;Trunk flexed   Gait velocity interpretation: Below normal speed for age/gender General Gait Details: Slow, tends to drift and will bump into objects if not corrected.   Stairs Stairs: Yes Stairs assistance: Min guard Stair Management: Two rails;Forwards Number of Stairs: 3 General stair comments: Min G for safety.  Wheelchair Mobility    Modified Rankin (Stroke Patients Only)       Balance                                    Cognition Arousal/Alertness: Awake/alert Behavior During Therapy: WFL for tasks  assessed/performed Overall Cognitive Status: History of cognitive impairments - at baseline                      Exercises General Exercises - Lower Extremity Hip Flexion/Marching: AROM;Both;10 reps;Seated    General Comments        Pertinent Vitals/Pain Pain Assessment: Faces Faces Pain Scale: Hurts a little bit Pain Location: BLEs Pain Descriptors / Indicators: Aching Pain Intervention(s): Monitored during session;Repositioned    Home Living     Available Help at Discharge: Family;Personal care attendant;Available PRN/intermittently Type of Home: House              Prior Function            PT Goals (current goals can now be found in the care plan section) Progress towards PT goals: Progressing toward goals    Frequency  Min 3X/week    PT Plan Current plan remains appropriate    Co-evaluation             End of Session Equipment Utilized During Treatment: Gait belt Activity Tolerance: Patient tolerated treatment well Patient left: in chair;with call bell/phone within reach;with chair alarm set     Time: 1130-1155 PT Time Calculation (min) (ACUTE ONLY): 25 min  Charges:                       G CodesAllyn Kenner, SPTA 18-Nov-2014, 12:17 PM

## 2014-11-11 NOTE — Progress Notes (Signed)
  Echocardiogram 2D Echocardiogram has been performed.  Darlina Sicilian M 11/11/2014, 1:38 PM

## 2014-11-11 NOTE — Progress Notes (Signed)
[facility] PROGRESS NOTE   Assessment/Plan: TIA (transient ischemic attack): HgbA1c pending, LDL > 70, HDL < 40 on statins. MRI, MRA of the brain  No acute infarct. Very mild small vessel disease type changes. PT consult, OT consult, home PT. Echocardiogram pending and  Carotid dopplers no ICA stenosis Prophylactic therapy-Antiplatelet [id number] have to change to Plavix Appriciate Neurology assistance.  AKI on CKD (chronic kidney disease) stage 3, GFR 30-59 ml/min - Unclear etiology, Fena < 1%. Likely pre-renal hold lasix start IV fluids. - check orthostatics. - hold a ACE-I and lasix.  DM (diabetes mellitus), type 1, uncontrolled w/ophthalmic complication - [id number] pending. checnge BCBG's to ACHS.  Hyperlipidemia: - statin  Essential hypertension - allow permissive HTN.  Chronic diastolic heart failure: - Hold lasix   Code Status: full Family Communication: none  Disposition Plan: inpatient   Consultants:  neuro  Procedures:  MRI showed no CVA  Carotid 7.24.2016:  1-39% ICA stenosis. Vertebral artery flow is antegrade.   MRA No large or proximal arterial branch occlusion identified within the intracranial circulation. No hemodynamically significant or correctable stenosis identified.   Antibiotics:  None  HPI/Subjective: No compalins  Objective: Filed Vitals:   11/10/14 2112 11/11/14 0140 11/11/14 0614 11/11/14 0906  BP: 181/82 183/83 191/74 149/71  Pulse: 72 73 66 84  Temp: 98.3 F (36.8 C) 98.1 F (36.7 C) 97.8 F (36.6 C) 98.5 F (36.9 C)  TempSrc: Oral Oral Oral Oral  Resp: 16 16 16 20   Height:      Weight:      SpO2: 100% 100% 100% 98%    Intake/Output Summary (Last 24 hours) at 11/11/14 0956 Last data filed at 11/11/14 0844  Gross per 24 hour  Intake    360 ml  Output   1050 ml  Net   -690 ml   Filed Weights   11/09/14 1559 11/09/14 2109  Weight: 77.111 kg (170 lb) 76.9 kg (169 lb 8.5 oz)    Exam:  General: Alert,  awake, oriented x3, in no acute distress.  HEENT: No bruits, no goiter.  Heart: Regular rate and rhythm. Lungs: Good air movement, clear Abdomen: Soft, nontender, nondistended, positive bowel sounds.  Neuro: Grossly intact, nonfocal.   Data Reviewed: Basic Metabolic Panel:  Recent Labs Lab 11/09/14 1634  NA 135  K 3.8  CL 103  CO2 25  GLUCOSE 100*  BUN 29*  CREATININE 1.71*  CALCIUM 8.8*   Liver Function Tests:  Recent Labs Lab 11/09/14 1634  AST 19  ALT 16*  ALKPHOS 120  BILITOT 0.4  PROT 6.8  ALBUMIN 3.1*   No results for input(s): LIPASE, AMYLASE in the last 168 hours. No results for input(s): AMMONIA in the last 168 hours. CBC:  Recent Labs Lab 11/09/14 1634  WBC 4.2  NEUTROABS 2.7  HGB 10.7*  HCT 31.7*  MCV 98.4  PLT 150   Cardiac Enzymes: No results for input(s): CKTOTAL, CKMB, CKMBINDEX, TROPONINI in the last 168 hours. BNP (last 3 results) No results for input(s): BNP in the last 8760 hours.  ProBNP (last 3 results) No results for input(s): PROBNP in the last 8760 hours.  CBG:  Recent Labs Lab 11/10/14 0830 11/10/14 1117 11/10/14 1623 11/10/14 2155 11/11/14 0641  GLUCAP 105* 189* 125* 157* 92    No results found for this or any previous visit (from the past 240 hour(s)).   Studies: Dg Chest 2 View  11/10/2014   CLINICAL DATA:  Transient ischemic attack, acute onset.  Initial encounter.  EXAM: CHEST  2 VIEW  COMPARISON:  Chest radiograph performed 04/11/2014  FINDINGS: The lungs are well-aerated. Mild scarring is noted at the left upper lung zone. There is no evidence of pleural effusion or pneumothorax.  The heart is normal in size; the mediastinal contour is within normal limits. No acute osseous abnormalities are seen.  IMPRESSION: Mild scarring at the left upper lung zone.  Lungs otherwise clear.   Electronically Signed   By: Garald Balding M.D.   On: 11/10/2014 03:02   Ct Head Wo Contrast  11/09/2014   CLINICAL DATA:  81 year old  male with slurred speech and right leg numbness  EXAM: CT HEAD WITHOUT CONTRAST  TECHNIQUE: Contiguous axial images were obtained from the base of the skull through the vertex without intravenous contrast.  COMPARISON:  CT dated 04/11/2014  FINDINGS: The ventricles are dilated and the sulci are prominent compatible with age-related atrophy. Periventricular and deep white matter hypodensities represent chronic microvascular ischemic changes. There is no intracranial hemorrhage. No mass effect or midline shift identified.  The visualized paranasal sinuses and mastoid air cells are well aerated. The calvarium is intact.  IMPRESSION: No acute intracranial pathology.  Age-related atrophy and chronic microvascular ischemic disease.  If symptoms persist and there are no contraindications, MRI may provide better evaluation if clinically indicated.   Electronically Signed   By: Anner Crete M.D.   On: 11/09/2014 17:19   Mr Brain Wo Contrast  11/09/2014   CLINICAL DATA:  81 year old hypertensive diabetic male with speech difficulty. Subsequent encounter.  EXAM: MRI HEAD WITHOUT CONTRAST  TECHNIQUE: Multiplanar, multiecho pulse sequences of the brain and surrounding structures were obtained without intravenous contrast.  COMPARISON:  11/09/2014 CT.  02/09/2012 MR.  FINDINGS: No acute infarct.  No intracranial hemorrhage.  Very mild small vessel disease type changes.  Global atrophy. Ventricular prominence felt to be related to atrophy rather than hydrocephalus.  No intracranial mass lesion noted on this unenhanced exam.  Major intracranial vascular structures are patent.  Prominent calcification of the falx and tentorium.  Exophthalmos.  Post left lens replacement.  Complete opacification left mastoid air cells and left middle ear cavity. No obstructing lesion of the eustachian tube noted. Partial opacification of the petrous apex bilaterally.  Mild spinal stenosis C3-4. Mild transverse ligament hypertrophy. Cervical  medullary junction unremarkable.  Partially empty sella.  Pineal region unremarkable.  IMPRESSION: No acute infarct.  Very mild small vessel disease type changes.  Global atrophy.  Complete opacification left mastoid air cells and left middle ear cavity. No obstructing lesion of the eustachian tube noted. Partial opacification of the new petrous apex bilaterally.   Electronically Signed   By: Genia Del M.D.   On: 11/09/2014 21:07   Mr Jodene Nam Head/brain Wo Cm  11/10/2014   CLINICAL DATA:  Initial evaluation for acute slurred speech.  EXAM: MRA HEAD WITHOUT CONTRAST  TECHNIQUE: Angiographic images of the Circle of Willis were obtained using MRA technique without intravenous contrast.  COMPARISON:  None.  FINDINGS: ANTERIOR CIRCULATION:  Study is degraded by motion artifact.  Visualized distal cervical segments of the internal carotid arteries are widely patent with antegrade flow. The petrous, cavernous, and supra clinoid segments are widely patent. A1 segments, anterior communicating artery, and anterior cerebral arteries well opacified bilaterally. M1 segments widely patent without stenosis or occlusion. MCA bifurcations within normal limits. Distal MCA branches demonstrate multi focal atheromatous irregularity, but are fairly symmetric bilaterally.  POSTERIOR CIRCULATION:  Vertebral arteries are widely  patent to the vertebrobasilar junction. The right vertebral artery is slightly dominant. Posterior inferior cerebellar arteries patent proximally. Basilar artery widely patent. Right anterior inferior cerebral artery is dominant. Superior cerebellar arteries well opacified bilaterally. Both posterior cerebral arteries arise from the basilar artery. Posterior cerebral arteries are opacified to their distal aspects. Apparent signal loss within the distal right P2 segment felt to be artifactual in nature due to motion artifact through this area on this exam. Distal branch atheromatous irregularity within the PCA  branches bilaterally.  No aneurysm or vascular malformation.  IMPRESSION: 1. No large or proximal arterial branch occlusion identified within the intracranial circulation. No hemodynamically significant or correctable stenosis identified. 2. Atheromatous irregularity within the distal posterior cerebral arteries and distal MCA branches.   Electronically Signed   By: Jeannine Boga M.D.   On: 11/10/2014 01:40    Scheduled Meds: . calcitRIOL  0.25 mcg Oral Daily  . cholecalciferol  1,000 Units Oral Daily  . clopidogrel  75 mg Oral Daily  . docusate sodium  100 mg Oral Daily  . donepezil  10 mg Oral QHS  . enoxaparin (LOVENOX) injection  30 mg Subcutaneous Q24H  . fluticasone  1 spray Each Nare Daily  . furosemide  40 mg Intravenous BID  . insulin aspart  0-9 Units Subcutaneous TID WC  . insulin aspart  3 Units Subcutaneous TID WC  . insulin glargine  10 Units Subcutaneous Daily  . loratadine  10 mg Oral Q lunch  . pantoprazole  40 mg Oral Daily  . phenytoin  300 mg Oral QHS  . senna  1 tablet Oral Daily   Continuous Infusions:   Time Spent: 25 min   Charlynne Cousins  [facility] Pager [phone]. If 7PM-7AM, please contact night-coverage at [email or web address], password [email or web address] 11/11/2014, 9:56 AM

## 2014-11-12 DIAGNOSIS — N179 Acute kidney failure, unspecified: Secondary | ICD-10-CM | POA: Diagnosis not present

## 2014-11-12 DIAGNOSIS — G459 Transient cerebral ischemic attack, unspecified: Secondary | ICD-10-CM | POA: Diagnosis not present

## 2014-11-12 DIAGNOSIS — E1039 Type 1 diabetes mellitus with other diabetic ophthalmic complication: Secondary | ICD-10-CM | POA: Diagnosis not present

## 2014-11-12 DIAGNOSIS — N183 Chronic kidney disease, stage 3 (moderate): Secondary | ICD-10-CM | POA: Diagnosis not present

## 2014-11-12 DIAGNOSIS — G458 Other transient cerebral ischemic attacks and related syndromes: Secondary | ICD-10-CM | POA: Diagnosis not present

## 2014-11-12 LAB — BASIC METABOLIC PANEL
Anion gap: 8 (ref 5–15)
BUN: 28 mg/dL — ABNORMAL HIGH (ref 6–20)
CHLORIDE: 102 mmol/L (ref 101–111)
CO2: 28 mmol/L (ref 22–32)
Calcium: 8.9 mg/dL (ref 8.9–10.3)
Creatinine, Ser: 1.61 mg/dL — ABNORMAL HIGH (ref 0.61–1.24)
GFR calc Af Amer: 42 mL/min — ABNORMAL LOW (ref >60)
GFR calc non Af Amer: 37 mL/min — ABNORMAL LOW (ref >60)
Glucose, Bld: 121 mg/dL — ABNORMAL HIGH (ref 65–99)
POTASSIUM: 3.6 mmol/L (ref 3.5–5.1)
Sodium: 138 mmol/L (ref 135–145)

## 2014-11-12 LAB — GLUCOSE, CAPILLARY
Glucose-Capillary: 110 mg/dL — ABNORMAL HIGH (ref 65–99)
Glucose-Capillary: 225 mg/dL — ABNORMAL HIGH (ref 65–99)
Glucose-Capillary: 117 mg/dL — ABNORMAL HIGH (ref 65–99)
Glucose-Capillary: 134 mg/dL — ABNORMAL HIGH (ref 65–99)

## 2014-11-12 MED ORDER — SODIUM CHLORIDE 0.9 % IV SOLN
INTRAVENOUS | Status: AC
  Administered 2014-11-12: 1000 mL via INTRAVENOUS

## 2014-11-12 NOTE — Progress Notes (Signed)
Physical Therapy Treatment Patient Details Name: Eric [name] MRN: [id number] DOB: 08-16-1923 Today's Date: 11/12/2014    History of Present Illness Pt. is an 81 year old male who had dysarthria and R LE weakness. Pt. symptoms resolved by the time EMS arrived. Pt. admitted for TIA workup. Pt. PMH: DM, HTN, CKD, CHF, dementia.     PT Comments    Pt progressing towards physical therapy goals. Pt continues to have discomfort in LLE and foot, and noted edema. Pt was educated on ankle pumps and positioning with LLE on a pillow for continued edema control. Pt open to posture training however could use continued instruction on scapular retractions next session. Will continue to follow.   Follow Up Recommendations  Home health PT;Supervision - Intermittent     Equipment Recommendations  None recommended by PT    Recommendations for Other Services       Precautions / Restrictions Precautions Precautions: Fall Restrictions Weight Bearing Restrictions: No    Mobility  Bed Mobility Overal bed mobility: Modified Independent             General bed mobility comments: No physical assist required.   Transfers Overall transfer level: Needs assistance Equipment used: Rolling walker (2 wheeled) Transfers: Sit to/from Stand Sit to Stand: Min guard         General transfer comment: Pt required increased time to achieve full standing position. Appeared unsteady at first, and hands-on assist was provided for safety.   Ambulation/Gait Ambulation/Gait assistance: Min guard Ambulation Distance (Feet): 200 Feet Assistive device: Rolling walker (2 wheeled) Gait Pattern/deviations: Step-through pattern;Decreased stride length;Trunk flexed Gait velocity: Decreased Gait velocity interpretation: Below normal speed for age/gender General Gait Details: Generally slow and guarded gait pattern. Pt took 3 standing rest breaks due to fatigue. Was cued to stay in the middle of hall, however he  deviated both L and R throughout gait training.    Stairs            Wheelchair Mobility    Modified Rankin (Stroke Patients Only) Modified Rankin (Stroke Patients Only) Pre-Morbid Rankin Score: Moderate disability Modified Rankin: Moderately severe disability     Balance Overall balance assessment: Needs assistance Sitting-balance support: Feet supported;No upper extremity supported Sitting balance-Leahy Scale: Good     Standing balance support: Bilateral upper extremity supported;During functional activity Standing balance-Leahy Scale: Poor Standing balance comment: Requires UE support at this time for standing balance activity                    Cognition Arousal/Alertness: Awake/alert Behavior During Therapy: WFL for tasks assessed/performed Overall Cognitive Status: History of cognitive impairments - at baseline                      Exercises General Exercises - Lower Extremity Ankle Circles/Pumps: 10 reps Long Arc Quad: 10 reps Other Exercises Other Exercises: Pt was educated on posture training and instructed pt in x10 scapular retractions    General Comments        Pertinent Vitals/Pain Pain Assessment: Faces Faces Pain Scale: Hurts a little bit Pain Location: LLE and L foot Pain Descriptors / Indicators: Sore Pain Intervention(s): Limited activity within patient's tolerance;Monitored during session;Repositioned    Home Living                      Prior Function            PT Goals (current goals can now be found  in the care plan section) Acute Rehab PT Goals Patient Stated Goal: to go home PT Goal Formulation: With patient Time For Goal Achievement: 11/24/14 Potential to Achieve Goals: Good Progress towards PT goals: Progressing toward goals    Frequency  Min 3X/week    PT Plan Current plan remains appropriate    Co-evaluation             End of Session Equipment Utilized During Treatment: Gait  belt Activity Tolerance: Patient tolerated treatment well Patient left: in chair;with call bell/phone within reach;with chair alarm set     Time: [id number] PT Time Calculation (min) (ACUTE ONLY): 22 min  Charges:  $Gait Training: 8-22 mins                    G Codes:      Rolinda Roan November 15, 2014, 11:57 AM  Rolinda Roan, PT, DPT Acute Rehabilitation Services Pager: [phone]

## 2014-11-12 NOTE — Progress Notes (Signed)
STROKE TEAM PROGRESS NOTE   HISTORY Eric [name] is an 81 y.o. male hx of HTN, DM, HLD, CHF, dementia presenting with transient episode of dysarthria. Friend noted garbled speech, patient reports knowing what he wanted to say but unable to get the words out. Also notes RLE weakness at that time. Symptoms lasted < 1 hour. No associated sensory or visual deficits.   CT head and MRI imaging reviewed showed no acute process. Carotid doppler shows 1-39% bilateral .   Last seen well: 7/23 at 1430. tPA not given due to resolution of symptoms.    SUBJECTIVE (INTERVAL HISTORY) No family members present. The patient was working with physical therapy. He feels he is doing better. He reports that he is almost back to baseline. He has an aide at home 6 days per week.   OBJECTIVE Temp:  [97.7 F (36.5 C)-98.7 F (37.1 C)] 97.7 F (36.5 C) (07/26 0616) Pulse Rate:  [71-85] 71 (07/26 0616) Cardiac Rhythm:  [-] Heart block;Normal sinus rhythm (07/26 0800) Resp:  [20] 20 (07/26 0616) BP: (150-182)/(74-88) 150/74 mmHg (07/26 0616) SpO2:  [100 %] 100 % (07/26 0616)   Recent Labs Lab 11/11/14 1127 11/11/14 1631 11/11/14 2232 11/12/14 0632 11/12/14 1107  GLUCAP 165* 154* 182* 110* 225*    Recent Labs Lab 11/09/14 1634 11/11/14 1124 11/12/14 0620  NA 135 137 138  K 3.8 3.6 3.6  CL 103 101 102  CO2 25 28 28   GLUCOSE 100* 160* 121*  BUN 29* 30* 28*  CREATININE 1.71* 1.72* 1.61*  CALCIUM 8.8* 9.4 8.9    Recent Labs Lab 11/09/14 1634  AST 19  ALT 16*  ALKPHOS 120  BILITOT 0.4  PROT 6.8  ALBUMIN 3.1*    Recent Labs Lab 11/09/14 1634  WBC 4.2  NEUTROABS 2.7  HGB 10.7*  HCT 31.7*  MCV 98.4  PLT 150   No results for input(s): CKTOTAL, CKMB, CKMBINDEX, TROPONINI in the last 168 hours.  Recent Labs  11/09/14 1634  LABPROT 14.3  INR 1.09    Recent Labs  11/09/14 1717  COLORURINE YELLOW  LABSPEC 1.011  PHURINE 6.5  GLUCOSEU NEGATIVE  HGBUR NEGATIVE  BILIRUBINUR  NEGATIVE  KETONESUR NEGATIVE  PROTEINUR NEGATIVE  UROBILINOGEN 0.2  NITRITE NEGATIVE  LEUKOCYTESUR NEGATIVE       Component Value Date/Time   CHOL 159 11/10/2014 0855   TRIG 112 11/10/2014 0855   HDL 31* 11/10/2014 0855   CHOLHDL 5.1 11/10/2014 0855   VLDL 22 11/10/2014 0855   LDLCALC 106* 11/10/2014 0855   Lab Results  Component Value Date   HGBA1C 6.5* 11/10/2014      Component Value Date/Time   LABOPIA NONE DETECTED 11/09/2014 1717   COCAINSCRNUR NONE DETECTED 11/09/2014 1717   LABBENZ NONE DETECTED 11/09/2014 1717   AMPHETMU NONE DETECTED 11/09/2014 1717   THCU NONE DETECTED 11/09/2014 1717   LABBARB NONE DETECTED 11/09/2014 1717     Recent Labs Lab 11/09/14 Julian <5      Imaging   Dg Chest 2 View 11/10/2014    Mild scarring at the left upper lung zone.  Lungs otherwise clear.     Ct Head Wo Contrast 11/09/2014    No acute intracranial pathology.  Age-related atrophy and chronic microvascular ischemic disease.  If symptoms persist and there are no contraindications, MRI may provide better evaluation if clinically indicated.      Mr Brain Wo Contrast 11/09/2014    No acute infarct.  Very mild small  vessel disease type changes.  Global atrophy.  Complete opacification left mastoid air cells and left middle ear cavity. No obstructing lesion of the eustachian tube noted. Partial opacification of the new petrous apex bilaterally.       Mr Jodene Nam Head/brain Wo Cm 11/10/2014    1. No large or proximal arterial branch occlusion identified within the intracranial circulation. No hemodynamically significant or correctable stenosis identified.  2. Atheromatous irregularity within the distal posterior cerebral arteries and distal MCA branches.        PHYSICAL EXAM Pleasant elderly Caucasian male currently not in distress. . Afebrile. Head is nontraumatic. Neck is supple without bruit.    Cardiac exam no murmur or gallop. Lungs are clear to auscultation. Distal  pulses are well felt. Neurological Exam ;  Awake  Alert oriented x 3. Normal speech and language.eye movements full without nystagmus.fundi were not visualized. Vision acuity and fields appear normal. Hearing is normal. Palatal movements are normal. Face symmetric. Tongue midline. Normal strength, tone, reflexes and coordination. Normal sensation. Gait deferred.   ASSESSMENT/PLAN Eric [name] is a 81 y.o. male with history of HTN, DM, HLD, CHF, dementia  presenting with transient dysarthria and right lower extremity weakness. He did not receive IV t-PA due to resolution of deficits.   TIA:  Dominant secondary to small vessel disease.  Resultant  resolution of deficits  MRI  no acute infarct  MRA  no significant stenosis  Carotid Doppler Bilateral: 1-39% ICA stenosis. Vertebral artery flow is antegrade.  2D Echo  EF 55-60%. No cardiac source of emboli identified.  LDL 106  HgbA1c 6.5  Lovenox for VTE prophylaxis Diet heart healthy/carb modified Room service appropriate?: Yes; Fluid consistency:: Thin  aspirin 81 mg orally every day prior to admission, now on clopidogrel 75 mg orally every day  Ongoing aggressive stroke risk factor management  Therapy recommendations:  Home health physical therapy recommended  Disposition: Pending  Hypertension  Home meds: Hydralazine, lisinopril, and metoprolol  Stable   Hyperlipidemia  Home meds:  No lipid lowering medications prior to admission.  LDL 106, goal < 70  Add Lipitor 20 mg daily  Continue statin at discharge  Diabetes  HgbA1c 6.5, goal < 7.0  Controlled  Other Stroke Risk Factors  Advanced age   Other Active Problems  Renal insufficiency  Anemia  PLAN  The stroke team will sign off at this time. Please call if we can be of further service.  Continue Plavix 75 mg daily.  Follow-up with Dr. Leonie Man in 2 months  Hospital day #   Mikey Bussing PA-C [facility] Pager [phone] 11/12/2014, 2:25 PM I have personally examined this patient, reviewed notes, independently viewed imaging studies, participated in medical decision making and plan of care. I have made any additions or clarifications directly to the above note. Agree with note above.  He presented with transient dysarthria and right-sided weakness likely left hemispheric TIA and remains at risk for neurological worsening, recurrent stroke, TIA and needs ongoing stroke evaluation and aggressive risk factor modification. Change aspirin to Plavix for secondary stroke prevention I have personally examined this patient, reviewed notes, independently viewed imaging studies, participated in medical decision making and plan of care. I have made any additions or clarifications directly to the above note. Agree with note above.    Antony Contras, MD Medical Director [facility] Pager: [phone] 11/12/2014 2:37 PM     To contact Stroke Continuity provider, please refer to [email or web address]  After hours, contact General Neurology

## 2014-11-12 NOTE — Progress Notes (Signed)
[facility] PROGRESS NOTE   Assessment/Plan: TIA (transient ischemic attack): HgbA1c 6.5, LDL > 70, HDL < 40 on statins. MRI, MRA of the brain  No acute infarct. Very mild small vessel disease type changes. PT consult, recommended Home health PT. Echocardiogram as belowand  Carotid dopplers no ICA stenosis Prophylactic therapy-Antiplatelet CWC:BJSE have to change to Plavix Appriciate Neurology assistance.  AKI on CKD (chronic kidney disease) stage 3, GFR 30-59 ml/min - Likely pre-renal etiology, Fena < 1%.slowly improving. - Cont NS fluid, recheck a b-met in am. Baseline cr 1.3 - hold a ACE-I and lasix.  DM (diabetes mellitus), type 1, uncontrolled w/ophthalmic complication - G3T 6.5.  Cont current regimen.  Hyperlipidemia: - statin  Essential hypertension - Bp slowly trending down.  Chronic diastolic heart failure: - Hold lasix   Code Status: full Family Communication: none  Disposition Plan: home in am   Consultants:  neuro  Procedures:  MRI showed no CVA  Carotid 7.24.2016:  1-39% ICA stenosis. Vertebral artery flow is antegrade.   MRA No large or proximal arterial branch occlusion identified within the intracranial circulation. No hemodynamically significant or correctable stenosis identified.  Echo [id number]: Systolic function was normal. The estimated ejection fraction was in the range of 55% to 60%. Wall motion was normal; there were no regional wall motion abnormalities. Doppler parameters are consistent with abnormal left ventricular relaxation (grade 1 diastolic dysfunction).  Tricuspid valve: There was moderate regurgitation. - Pulmonic valve: There was moderate regurgitation. - Pulmonary arteries: PA peak pressure: 40 mm Hg.  Antibiotics:  None  HPI/Subjective: No compalins  Objective: Filed Vitals:   11/11/14 1702 11/11/14 2227 11/12/14 0151 11/12/14 0616  BP: 158/79 182/88 158/80 150/74  Pulse: 74 85 78 71  Temp: 97.8  F (36.6 C) 98.7 F (37.1 C) 98.5 F (36.9 C) 97.7 F (36.5 C)  TempSrc: Oral Oral Oral Oral  Resp: 20 20 20 20   Height:      Weight:      SpO2: 100% 100% 100% 100%    Intake/Output Summary (Last 24 hours) at 11/12/14 1111 Last data filed at 11/12/14 0142  Gross per 24 hour  Intake    480 ml  Output    950 ml  Net   -470 ml   Filed Weights   11/09/14 1559 11/09/14 2109  Weight: 77.111 kg (170 lb) 76.9 kg (169 lb 8.5 oz)    Exam:  General: Alert, awake, oriented x3, in no acute distress.  HEENT: No bruits, no goiter.  Heart: Regular rate and rhythm. Lungs: Good air movement, clear Abdomen: Soft, nontender, nondistended, positive bowel sounds.  Neuro: Grossly intact, nonfocal.   Data Reviewed: Basic Metabolic Panel:  Recent Labs Lab 11/09/14 1634 11/11/14 1124 11/12/14 0620  NA 135 137 138  K 3.8 3.6 3.6  CL 103 101 102  CO2 25 28 28   GLUCOSE 100* 160* 121*  BUN 29* 30* 28*  CREATININE 1.71* 1.72* 1.61*  CALCIUM 8.8* 9.4 8.9   Liver Function Tests:  Recent Labs Lab 11/09/14 1634  AST 19  ALT 16*  ALKPHOS 120  BILITOT 0.4  PROT 6.8  ALBUMIN 3.1*   No results for input(s): LIPASE, AMYLASE in the last 168 hours. No results for input(s): AMMONIA in the last 168 hours. CBC:  Recent Labs Lab 11/09/14 1634  WBC 4.2  NEUTROABS 2.7  HGB 10.7*  HCT 31.7*  MCV 98.4  PLT 150   Cardiac Enzymes: No results for input(s): CKTOTAL, CKMB, CKMBINDEX, TROPONINI  in the last 168 hours. BNP (last 3 results) No results for input(s): BNP in the last 8760 hours.  ProBNP (last 3 results) No results for input(s): PROBNP in the last 8760 hours.  CBG:  Recent Labs Lab 11/11/14 0641 11/11/14 1127 11/11/14 1631 11/11/14 2232 11/12/14 0632  GLUCAP 92 165* 154* 182* 110*    No results found for this or any previous visit (from the past 240 hour(s)).   Studies: No results found.  Scheduled Meds: . atorvastatin  20 mg Oral q1800  . calcitRIOL  0.25  mcg Oral Daily  . cholecalciferol  1,000 Units Oral Daily  . clopidogrel  75 mg Oral Daily  . docusate sodium  100 mg Oral Daily  . donepezil  10 mg Oral QHS  . enoxaparin (LOVENOX) injection  30 mg Subcutaneous Q24H  . fluticasone  1 spray Each Nare Daily  . insulin aspart  0-9 Units Subcutaneous TID WC  . insulin aspart  3 Units Subcutaneous TID WC  . insulin glargine  10 Units Subcutaneous Daily  . loratadine  10 mg Oral Q lunch  . pantoprazole  40 mg Oral Daily  . phenytoin  300 mg Oral QHS  . senna  1 tablet Oral Daily   Continuous Infusions:   Time Spent: 25 min   Charlynne Cousins  [facility] Pager [phone]. If 7PM-7AM, please contact night-coverage at [email or web address], password [email or web address] 11/12/2014, 11:11 AM

## 2014-11-13 DIAGNOSIS — N179 Acute kidney failure, unspecified: Secondary | ICD-10-CM | POA: Diagnosis not present

## 2014-11-13 DIAGNOSIS — E1039 Type 1 diabetes mellitus with other diabetic ophthalmic complication: Secondary | ICD-10-CM | POA: Diagnosis not present

## 2014-11-13 DIAGNOSIS — N183 Chronic kidney disease, stage 3 (moderate): Secondary | ICD-10-CM | POA: Diagnosis not present

## 2014-11-13 DIAGNOSIS — G459 Transient cerebral ischemic attack, unspecified: Secondary | ICD-10-CM | POA: Diagnosis not present

## 2014-11-13 LAB — BASIC METABOLIC PANEL
ANION GAP: 6 (ref 5–15)
BUN: 28 mg/dL — AB (ref 6–20)
CO2: 25 mmol/L (ref 22–32)
Calcium: 8.4 mg/dL — ABNORMAL LOW (ref 8.9–10.3)
Chloride: 104 mmol/L (ref 101–111)
Creatinine, Ser: 1.59 mg/dL — ABNORMAL HIGH (ref 0.61–1.24)
GFR calc non Af Amer: 37 mL/min — ABNORMAL LOW (ref >60)
GFR, EST AFRICAN AMERICAN: 43 mL/min — AB (ref >60)
Glucose, Bld: 171 mg/dL — ABNORMAL HIGH (ref 65–99)
POTASSIUM: 4 mmol/L (ref 3.5–5.1)
Sodium: 135 mmol/L (ref 135–145)

## 2014-11-13 LAB — GLUCOSE, CAPILLARY
Glucose-Capillary: 141 mg/dL — ABNORMAL HIGH (ref 65–99)
Glucose-Capillary: 166 mg/dL — ABNORMAL HIGH (ref 65–99)

## 2014-11-13 MED ORDER — LISINOPRIL 40 MG PO TABS: 40.0000 mg | ORAL_TABLET | Freq: Two times a day (BID) | ORAL | Status: DC

## 2014-11-13 MED ORDER — FUROSEMIDE 20 MG PO TABS: 20.0000 mg | ORAL_TABLET | Freq: Two times a day (BID) | ORAL | Status: DC

## 2014-11-13 MED ORDER — SPIRONOLACTONE 25 MG PO TABS: 12.5000 mg | ORAL_TABLET | Freq: Every day | ORAL | Status: DC

## 2014-11-13 MED ORDER — ATORVASTATIN CALCIUM 20 MG PO TABS: 20.0000 mg | ORAL_TABLET | Freq: Every day | ORAL | Status: DC

## 2014-11-13 MED ORDER — CLOPIDOGREL BISULFATE 75 MG PO TABS: 75.0000 mg | ORAL_TABLET | Freq: Every day | ORAL | Status: DC

## 2014-11-13 NOTE — Discharge Summary (Signed)
Physician Discharge Summary  Eric [name] [id number] DOB: [birth year] DOA: 11/09/2014  PCP: Renato Shin, MD  Admit date: 11/09/2014 Discharge date: 11/13/2014  Time spent: 35 minutes  Recommendations for Outpatient Follow-up:  1. Follow up with neurology as an outpatient in 4 weeks.  Discharge Diagnoses:  Principal Problem:   TIA (transient ischemic attack) Active Problems:   DM (diabetes mellitus), type 1, uncontrolled w/ophthalmic complication   Hyperlipidemia   Essential hypertension   GERD   Benign prostatic hyperplasia   CKD (chronic kidney disease) stage 3, GFR 30-59 ml/min   Hyponatremia   AKI (acute kidney injury)   Discharge Condition: stable  Diet recommendation: heart healthy  Filed Weights   11/09/14 1559 11/09/14 2109  Weight: 77.111 kg (170 lb) 76.9 kg (169 lb 8.5 oz)    History of present illness:  81yo man with PMH of HTN, GERD< DM, BPH, HLD, CKD, Grade 1 diastolic CHF, dementia who presents for dysarthria. His symptoms started today at about 230pm after church. He was talking to a friend and noticed that his words were garbled. He knew what he was trying to say, but could not get it out. He also had right leg weakness and had to be helped out of his chair. The leg weakness has happened once before, Wednesday of this week, after exercising at the [facility]. His symptoms resolved by the time EMS had arrived. He has never had a stroke before. He denies falling, sensation changes, lightheadedness, dizziness, recent illness, nausea, vomiting, chest pain, SOB. Risk factors for stroke include HTN, DM, HLD. CT head done in the ED showed no acute pathology. MRI showed no acute pathology, mild atherosclerosis.   Hospital Course:  TIA (transient ischemic attack): HgbA1c 6.5, LDL > 70, HDL < 40 on statins. Neurology consulted MRI, MRA of the brain No acute infarct. Very mild small vessel disease type changes. PT consult, recommended Home health  PT. Echocardiogram as below and Carotid dopplers no ICA stenosis Prophylactic therapy-Antiplatelet med: Plavix.  AKI on CKD (chronic kidney disease) stage 3, GFR 30-59 ml/min - Likely pre-renal etiology, started on IV fluids, Fena < 1%. Cr slowly improving. - Hold lasix, ACE-I and spironolactone for 2-4 days.  DM (diabetes mellitus), type 1, uncontrolled w/ophthalmic complication - 123456 6.5.  No changes made to his regimen.  Hyperlipidemia: - statin  Essential hypertension - Bp slowly trending down.  Chronic diastolic heart failure: - Resume home medications as an outpatient.  Procedures:  MRI showed no CVA  Carotid 7.24.2016:  1-39% ICA stenosis. Vertebral artery flow is antegrade.   MRA No large or proximal arterial branch occlusion identified within the intracranial circulation. No hemodynamically significant or correctable stenosis identified.  Echo [id number]: Systolic function was normal. The estimated ejection fraction was in the range of 55% to 60%. Wall motion was normal; there were no regional wall motion abnormalities. Doppler parameters are consistent with abnormal left ventricular relaxation (grade 1 diastolic dysfunction). Tricuspid valve: There was moderate regurgitation. - Pulmonic valve: There was moderate regurgitation. - Pulmonary arteries: PA peak pressure: 40 mm Hg.  Consultations:  Neurology  Discharge Exam: Filed Vitals:   11/13/14 0915  BP: 118/66  Pulse: 81  Temp: 98.7 F (37.1 C)  Resp: 18    General: A&O x3 Cardiovascular: RRR Respiratory: good air movement CTA B/L  Discharge Instructions   Discharge Instructions    Diet - low sodium heart healthy    Complete by:  As directed      Increase activity slowly  Complete by:  As directed           Current Discharge Medication List    START taking these medications   Details  atorvastatin (LIPITOR) 20 MG tablet Take 1 tablet (20 mg total) by mouth daily at 6 PM. Qty:  30 tablet, Refills: 0    clopidogrel (PLAVIX) 75 MG tablet Take 1 tablet (75 mg total) by mouth daily. Qty: 30 tablet, Refills: 0      CONTINUE these medications which have CHANGED   Details  furosemide (LASIX) 20 MG tablet Take 1 tablet (20 mg total) by mouth 2 (two) times daily. Qty: 30 tablet, Refills: 2    lisinopril (PRINIVIL,ZESTRIL) 40 MG tablet Take 1 tablet (40 mg total) by mouth 2 (two) times daily. Refills: 3    spironolactone (ALDACTONE) 25 MG tablet Take 0.5 tablets (12.5 mg total) by mouth at bedtime. Refills: 3      CONTINUE these medications which have NOT CHANGED   Details  acetaminophen (TYLENOL) 500 MG tablet Take 500 mg by mouth 2 (two) times daily.    calcitRIOL (ROCALTROL) 0.25 MCG capsule Take 0.25 mcg by mouth daily.     cholecalciferol (VITAMIN D) 1000 UNITS tablet Take 1,000 Units by mouth daily.    docusate sodium (COLACE) 100 MG capsule Take 100 mg by mouth daily.    donepezil (ARICEPT) 10 MG tablet Take 10 mg by mouth at bedtime.    Insulin Glargine (LANTUS SOLOSTAR) 100 UNIT/ML Solostar Pen Inject 20 Units into the skin daily.     loratadine (CLARITIN) 10 MG tablet Take 10 mg by mouth daily with lunch.    metoprolol succinate (TOPROL-XL) 25 MG 24 hr tablet Take 25 mg by mouth daily. Refills: 3    omeprazole (PRILOSEC) 40 MG capsule Take 40 mg by mouth daily before breakfast.     OVER THE COUNTER MEDICATION Apply 1 application topically daily. Over the counter cream to prevent rash on buttocks    phenytoin (DILANTIN) 100 MG ER capsule Take 300 mg by mouth at bedtime.    Polyethyl Glycol-Propyl Glycol (SYSTANE ULTRA) 0.4-0.3 % SOLN Place 1 drop into both eyes 3 (three) times daily as needed (dry eyes).    polyethylene glycol powder (GLYCOLAX/MIRALAX) powder Take 17 g by mouth daily as needed.  Refills: 12    senna (SENOKOT) 8.6 MG TABS Take 1 tablet by mouth daily.    Triamcinolone Acetonide (NASACORT AQ NA) Place 1 spray into both  nostrils daily.    ACCU-CHEK AVIVA PLUS test strip     B-D UF III MINI PEN NEEDLES 31G X 5 MM MISC Refills: 3    hydrOXYzine (ATARAX/VISTARIL) 25 MG tablet Take 1 tablet (25 mg total) by mouth every 6 (six) hours as needed for itching. Qty: 12 tablet, Refills: 0      STOP taking these medications     aspirin 81 MG EC tablet      hydrALAZINE (APRESOLINE) 25 MG tablet        Allergies  Allergen Reactions  . Percocet [Oxycodone-Acetaminophen] Rash   Follow-up Information    Follow up with SETHI,PRAMOD, MD In 2 weeks.   Specialties:  Neurology, Radiology   Why:  hospital follow up   Contact information:   [address] [place] [address] [phone]        The results of significant diagnostics from this hospitalization (including imaging, microbiology, ancillary and laboratory) are listed below for reference.    Significant Diagnostic Studies: Dg  Chest 2 View  11/10/2014   CLINICAL DATA:  Transient ischemic attack, acute onset. Initial encounter.  EXAM: CHEST  2 VIEW  COMPARISON:  Chest radiograph performed 04/11/2014  FINDINGS: The lungs are well-aerated. Mild scarring is noted at the left upper lung zone. There is no evidence of pleural effusion or pneumothorax.  The heart is normal in size; the mediastinal contour is within normal limits. No acute osseous abnormalities are seen.  IMPRESSION: Mild scarring at the left upper lung zone.  Lungs otherwise clear.   Electronically Signed   By: Garald Balding M.D.   On: 11/10/2014 03:02   Ct Head Wo Contrast  11/09/2014   CLINICAL DATA:  81 year old male with slurred speech and right leg numbness  EXAM: CT HEAD WITHOUT CONTRAST  TECHNIQUE: Contiguous axial images were obtained from the base of the skull through the vertex without intravenous contrast.  COMPARISON:  CT dated 04/11/2014  FINDINGS: The ventricles are dilated and the sulci are prominent compatible with age-related atrophy. Periventricular and deep white  matter hypodensities represent chronic microvascular ischemic changes. There is no intracranial hemorrhage. No mass effect or midline shift identified.  The visualized paranasal sinuses and mastoid air cells are well aerated. The calvarium is intact.  IMPRESSION: No acute intracranial pathology.  Age-related atrophy and chronic microvascular ischemic disease.  If symptoms persist and there are no contraindications, MRI may provide better evaluation if clinically indicated.   Electronically Signed   By: Anner Crete M.D.   On: 11/09/2014 17:19   Mr Brain Wo Contrast  11/09/2014   CLINICAL DATA:  81 year old hypertensive diabetic male with speech difficulty. Subsequent encounter.  EXAM: MRI HEAD WITHOUT CONTRAST  TECHNIQUE: Multiplanar, multiecho pulse sequences of the brain and surrounding structures were obtained without intravenous contrast.  COMPARISON:  11/09/2014 CT.  02/09/2012 MR.  FINDINGS: No acute infarct.  No intracranial hemorrhage.  Very mild small vessel disease type changes.  Global atrophy. Ventricular prominence felt to be related to atrophy rather than hydrocephalus.  No intracranial mass lesion noted on this unenhanced exam.  Major intracranial vascular structures are patent.  Prominent calcification of the falx and tentorium.  Exophthalmos.  Post left lens replacement.  Complete opacification left mastoid air cells and left middle ear cavity. No obstructing lesion of the eustachian tube noted. Partial opacification of the petrous apex bilaterally.  Mild spinal stenosis C3-4. Mild transverse ligament hypertrophy. Cervical medullary junction unremarkable.  Partially empty sella.  Pineal region unremarkable.  IMPRESSION: No acute infarct.  Very mild small vessel disease type changes.  Global atrophy.  Complete opacification left mastoid air cells and left middle ear cavity. No obstructing lesion of the eustachian tube noted. Partial opacification of the new petrous apex bilaterally.    Electronically Signed   By: Genia Del M.D.   On: 11/09/2014 21:07   Mr Jodene Nam Head/brain Wo Cm  11/10/2014   CLINICAL DATA:  Initial evaluation for acute slurred speech.  EXAM: MRA HEAD WITHOUT CONTRAST  TECHNIQUE: Angiographic images of the Circle of Willis were obtained using MRA technique without intravenous contrast.  COMPARISON:  None.  FINDINGS: ANTERIOR CIRCULATION:  Study is degraded by motion artifact.  Visualized distal cervical segments of the internal carotid arteries are widely patent with antegrade flow. The petrous, cavernous, and supra clinoid segments are widely patent. A1 segments, anterior communicating artery, and anterior cerebral arteries well opacified bilaterally. M1 segments widely patent without stenosis or occlusion. MCA bifurcations within normal limits. Distal MCA branches demonstrate multi focal  atheromatous irregularity, but are fairly symmetric bilaterally.  POSTERIOR CIRCULATION:  Vertebral arteries are widely patent to the vertebrobasilar junction. The right vertebral artery is slightly dominant. Posterior inferior cerebellar arteries patent proximally. Basilar artery widely patent. Right anterior inferior cerebral artery is dominant. Superior cerebellar arteries well opacified bilaterally. Both posterior cerebral arteries arise from the basilar artery. Posterior cerebral arteries are opacified to their distal aspects. Apparent signal loss within the distal right P2 segment felt to be artifactual in nature due to motion artifact through this area on this exam. Distal branch atheromatous irregularity within the PCA branches bilaterally.  No aneurysm or vascular malformation.  IMPRESSION: 1. No large or proximal arterial branch occlusion identified within the intracranial circulation. No hemodynamically significant or correctable stenosis identified. 2. Atheromatous irregularity within the distal posterior cerebral arteries and distal MCA branches.   Electronically Signed   By:  Jeannine Boga M.D.   On: 11/10/2014 01:40    Microbiology: No results found for this or any previous visit (from the past 240 hour(s)).   Labs: Basic Metabolic Panel:  Recent Labs Lab 11/09/14 1634 11/11/14 1124 11/12/14 0620 11/13/14 0404  NA 135 137 138 135  K 3.8 3.6 3.6 4.0  CL 103 101 102 104  CO2 25 28 28 25   GLUCOSE 100* 160* 121* 171*  BUN 29* 30* 28* 28*  CREATININE 1.71* 1.72* 1.61* 1.59*  CALCIUM 8.8* 9.4 8.9 8.4*   Liver Function Tests:  Recent Labs Lab 11/09/14 1634  AST 19  ALT 16*  ALKPHOS 120  BILITOT 0.4  PROT 6.8  ALBUMIN 3.1*   No results for input(s): LIPASE, AMYLASE in the last 168 hours. No results for input(s): AMMONIA in the last 168 hours. CBC:  Recent Labs Lab 11/09/14 1634  WBC 4.2  NEUTROABS 2.7  HGB 10.7*  HCT 31.7*  MCV 98.4  PLT 150   Cardiac Enzymes: No results for input(s): CKTOTAL, CKMB, CKMBINDEX, TROPONINI in the last 168 hours. BNP: BNP (last 3 results) No results for input(s): BNP in the last 8760 hours.  ProBNP (last 3 results) No results for input(s): PROBNP in the last 8760 hours.  CBG:  Recent Labs Lab 11/12/14 1107 11/12/14 1646 11/12/14 2157 11/13/14 0645 11/13/14 1142  GLUCAP 225* 117* 134* 141* 166*       Signed:  FELIZ ORTIZ, ABRAHAM  [facility] 11/13/2014, 1:11 PM

## 2014-11-13 NOTE — Progress Notes (Signed)
Patient discharged home with sister and home care IV removed discharge summary reviewed prescriptions provided, no pain alert and oriented vitals are stable.

## 2014-11-13 NOTE — Care Management Note (Signed)
Case Management Note  Patient Details  Name: Eric [name] MRN: [id number] Date of Birth: [birth year]  Subjective/Objective:                    Action/Plan: Met with patient and wife to discuss home health needs. Patient is agreeable to home health and has chosen [facility], who he has used in the past. Patient is requesting the same therapist, if available.  Karolee Stamps with [facility] was notified and has accepted the referral for discharge home today. Bedside RN updated.  Expected Discharge Date:                  Expected Discharge Plan:  [place]  In-House Referral:     Discharge planning Services  CM Consult  Post Acute Care Choice:    Choice offered to:  Patient, Spouse  DME Arranged:    DME Agency:     HH Arranged:  RN, PT, OT, Speech Therapy HH Agency:  [facility]  Status of Service:  Completed, signed off  Medicare Important Message Given:    Date Medicare IM Given:    Medicare IM give by:    Date Additional Medicare IM Given:    Additional Medicare Important Message give by:     If discussed at [place] of Stay Meetings, dates discussed:    Additional Comments:  [name], RN 11/13/2014, 2:57 PM

## 2014-12-20 ENCOUNTER — Encounter: Payer: Self-pay | Admitting: Neurology

## 2014-12-20 ENCOUNTER — Ambulatory Visit (INDEPENDENT_AMBULATORY_CARE_PROVIDER_SITE_OTHER): Payer: Medicare Other | Admitting: Neurology

## 2014-12-20 VITALS — BP 163/71 | HR 60 | Ht 69.0 in | Wt 169.8 lb

## 2014-12-20 DIAGNOSIS — G459 Transient cerebral ischemic attack, unspecified: Secondary | ICD-10-CM

## 2014-12-20 NOTE — Patient Instructions (Signed)
I had a long d/w patient about his recent TIA, risk for recurrent stroke/TIAs, personally independently reviewed imaging studies and stroke evaluation results and answered questions.Continue clopidogrel 75 mg orally every day  for secondary stroke prevention and maintain strict control of hypertension with blood pressure goal below 130/90, diabetes with hemoglobin A1c goal below 6.5% and lipids with LDL cholesterol goal below 100 mg/dL. I also advised the patient to eat a healthy diet with plenty of whole grains, cereals, fruits and vegetables, exercise regularly and maintain ideal body weight .continue Aricept for his mild dementia. Patient was also advised fall and safety precautions. Patient may also possibly consider participation in the CREAD dementia study if interested. Followup in the future with me in 6 months or call earlier if necessary. Stroke Prevention Some medical conditions and behaviors are associated with an increased chance of having a stroke. You may prevent a stroke by making healthy choices and managing medical conditions. HOW CAN I REDUCE MY RISK OF HAVING A STROKE?   Stay physically active. Get at least 30 minutes of activity on most or all days.  Do not smoke. It may also be helpful to avoid exposure to secondhand smoke.  Limit alcohol use. Moderate alcohol use is considered to be:  No more than 2 drinks per day for men.  No more than 1 drink per day for nonpregnant women.  Eat healthy foods. This involves:  Eating 5 or more servings of fruits and vegetables a day.  Making dietary changes that address high blood pressure (hypertension), high cholesterol, diabetes, or obesity.  Manage your cholesterol levels.  Making food choices that are high in fiber and low in saturated fat, trans fat, and cholesterol may control cholesterol levels.  Take any prescribed medicines to control cholesterol as directed by your health care provider.  Manage your  diabetes.  Controlling your carbohydrate and sugar intake is recommended to manage diabetes.  Take any prescribed medicines to control diabetes as directed by your health care provider.  Control your hypertension.  Making food choices that are low in salt (sodium), saturated fat, trans fat, and cholesterol is recommended to manage hypertension.  Take any prescribed medicines to control hypertension as directed by your health care provider.  Maintain a healthy weight.  Reducing calorie intake and making food choices that are low in sodium, saturated fat, trans fat, and cholesterol are recommended to manage weight.  Stop drug abuse.  Avoid taking birth control pills.  Talk to your health care provider about the risks of taking birth control pills if you are over 74 years old, smoke, get migraines, or have ever had a blood clot.  Get evaluated for sleep disorders (sleep apnea).  Talk to your health care provider about getting a sleep evaluation if you snore a lot or have excessive sleepiness.  Take medicines only as directed by your health care provider.  For some people, aspirin or blood thinners (anticoagulants) are helpful in reducing the risk of forming abnormal blood clots that can lead to stroke. If you have the irregular heart rhythm of atrial fibrillation, you should be on a blood thinner unless there is a good reason you cannot take them.  Understand all your medicine instructions.  Make sure that other conditions (such as anemia or atherosclerosis) are addressed. SEEK IMMEDIATE MEDICAL CARE IF:   You have sudden weakness or numbness of the face, arm, or leg, especially on one side of the body.  Your face or eyelid droops to one side.  You have sudden confusion.  You have trouble speaking (aphasia) or understanding.  You have sudden trouble seeing in one or both eyes.  You have sudden trouble walking.  You have dizziness.  You have a loss of balance or  coordination.  You have a sudden, severe headache with no known cause.  You have new chest pain or an irregular heartbeat. Any of these symptoms may represent a serious problem that is an emergency. Do not wait to see if the symptoms will go away. Get medical help at once. Call your local emergency services (911 in [place] Do not drive yourself to the hospital. Document Released: 05/13/2004 Document Revised: 08/20/2013 Document Reviewed: 10/06/2012 [facility] Patient Information 2015 [place], [place]. This information is not intended to replace advice given to you by your health care provider. Make sure you discuss any questions you have with your health care provider.

## 2014-12-20 NOTE — Progress Notes (Signed)
[facility] [address] [place]. [place] [address] [phone]       OFFICE FOLLOW-UP NOTE  [name] Date of Birth:  [birth year] Medical Record Number:  [id number]   HPI: 81 year African-American male seen today for first office follow-up visit for TIA from hospital admission in July 2016.81yo man with PMH of HTN, GERD< DM, BPH, HLD, CKD, Grade 1 diastolic CHF, dementia who presents for dysarthria. His symptoms started today at about 230pm after church. He was talking to a friend and noticed that his words were garbled. He knew what he was trying to say, but could not get it out. He also had right leg weakness and had to be helped out of his chair. The leg weakness has happened once before, Wednesday of this week, after exercising at the [facility] His symptoms resolved by the time EMS had arrived. He has never had a stroke before. He denies falling, sensation changes, lightheadedness, dizziness, recent illness, nausea, vomiting, chest pain, SOB. Risk factors for stroke include HTN, DM, HLD. CT head done in the ED showed no acute pathology. MRI showed no acute pathology, mild atherosclerosis. Carotid Doppler showed no significant extracranial stenosis. MRA of the brain showed no large vessel stenosis. LDL cholesterol was elevated and hemoglobin A1c was 6.5. Patient was started on Plavix and is tolerating it well without bleeding or bruising. He also has long-standing history of mild dementia which appears stable on Aricept. He lives at home but his family provides some support and checks on him frequently. He walks with a cane because of his arthritis but has not had any major falls.  ROS:   14 system review of systems is positive for  eye discharge, shortness of breath, leg swelling, aching muscles, weakness PMH:  Past Medical History  Diagnosis Date  . ANEMIA-NOS 02/17/2007  . DEPRESSION 12/25/2008  . HYPERTENSION 02/17/2007  . GERD 02/17/2007  .  OSTEOARTHRITIS 03/04/2010  . DIABETES MELLITUS, TYPE I, CONTROLLED, WITH RETINOPATHY   . Gastroparesis   . BENIGN PROSTATIC HYPERTROPHY   . VITAMIN B12 DEFICIENCY   . BACK PAIN, LUMBAR   . LIVER DISORDER   . PSA, INCREASED   . Hyperlipidemia   . Diabetes mellitus   . Pneumonia   . Anxiety   . Encephalopathy, metabolic   . Altered mental status   . CAP (community acquired pneumonia)   . Adult failure to thrive   . Hyponatremia   . CKD (chronic kidney disease)   . Bilateral leg edema     Social History:  Social History   Social History  . Marital Status: Widowed    Spouse Name: N/A  . Number of Children: N/A  . Years of Education: N/A   Occupational History  . Not on file.   Social History Main Topics  . Smoking status: Never Smoker   . Smokeless tobacco: Never Used  . Alcohol Use: No  . Drug Use: No  . Sexual Activity: No   Other Topics Concern  . Not on file   Social History Narrative   Works [employer] operational officer.   Widowed 2010.   Regular exercise-no    Medications:   Current Outpatient Prescriptions on File Prior to Visit  Medication Sig Dispense Refill  . ACCU-CHEK AVIVA PLUS test strip     . acetaminophen (TYLENOL) 500 MG tablet Take 500 mg by mouth 2 (two) times daily.    Marland Kitchen atorvastatin (LIPITOR) 20 MG tablet Take 1 tablet (20 mg total)  by mouth daily at 6 PM. 30 tablet 0  . B-D UF III MINI PEN NEEDLES 31G X 5 MM MISC   3  . calcitRIOL (ROCALTROL) 0.25 MCG capsule Take 0.25 mcg by mouth daily.     . cholecalciferol (VITAMIN D) 1000 UNITS tablet Take 1,000 Units by mouth daily.    . clopidogrel (PLAVIX) 75 MG tablet Take 1 tablet (75 mg total) by mouth daily. 30 tablet 0  . docusate sodium (COLACE) 100 MG capsule Take 100 mg by mouth daily.    Marland Kitchen donepezil (ARICEPT) 10 MG tablet Take 10 mg by mouth at bedtime.    . furosemide (LASIX) 20 MG tablet Take 1 tablet (20 mg total) by mouth 2 (two) times daily. 30 tablet 2  . hydrOXYzine (ATARAX/VISTARIL) 25 MG  tablet Take 1 tablet (25 mg total) by mouth every 6 (six) hours as needed for itching. 12 tablet 0  . Insulin Glargine (LANTUS SOLOSTAR) 100 UNIT/ML Solostar Pen Inject 20 Units into the skin daily.     Marland Kitchen lisinopril (PRINIVIL,ZESTRIL) 40 MG tablet Take 1 tablet (40 mg total) by mouth 2 (two) times daily.  3  . loratadine (CLARITIN) 10 MG tablet Take 10 mg by mouth daily with lunch.    . metoprolol succinate (TOPROL-XL) 25 MG 24 hr tablet Take 25 mg by mouth daily.  3  . omeprazole (PRILOSEC) 40 MG capsule Take 40 mg by mouth daily before breakfast.     . OVER THE COUNTER MEDICATION Apply 1 application topically daily. Over the counter cream to prevent rash on buttocks    . phenytoin (DILANTIN) 100 MG ER capsule Take 300 mg by mouth at bedtime.    Vladimir Faster Glycol-Propyl Glycol (SYSTANE ULTRA) 0.4-0.3 % SOLN Place 1 drop into both eyes 3 (three) times daily as needed (dry eyes).    . polyethylene glycol powder (GLYCOLAX/MIRALAX) powder Take 17 g by mouth daily as needed.   12  . senna (SENOKOT) 8.6 MG TABS Take 1 tablet by mouth daily.    Marland Kitchen spironolactone (ALDACTONE) 25 MG tablet Take 0.5 tablets (12.5 mg total) by mouth at bedtime.  3  . Triamcinolone Acetonide (NASACORT AQ NA) Place 1 spray into both nostrils daily.     No current facility-administered medications on file prior to visit.    Allergies:   Allergies  Allergen Reactions  . Percocet [Oxycodone-Acetaminophen] Rash    Physical Exam General: Frail elderly African-American male, seated, in no evident distress Head: head normocephalic and atraumatic.  Neck: supple with no carotid or supraclavicular bruits Cardiovascular: regular rate and rhythm, no murmurs Musculoskeletal: no deformity Skin:  no rash/petichiae Vascular:  Normal pulses all extremities Filed Vitals:   12/20/14 1037  BP: 163/71  Pulse: 60   Neurologic Exam Mental Status: Awake and fully alert. Oriented to place and time. Recent and remote memory  diminished. Attention span, concentration and fund of knowledge diminished. Mood and affect appropriate. Mini-Mental status exam scored 21/30 with deficits in orientation, attention, calculation and recall. Animal naming test scored 12. Clock drawing 1/4. Cranial Nerves: Fundoscopic exam reveals sharp disc margins. Pupils equal, briskly reactive to light. Extraocular movements full without nystagmus. Visual fields full to confrontation. Hearing intact. Facial sensation intact. Face, tongue, palate moves normally and symmetrically.  Motor: Normal bulk and tone. Normal strength in all tested extremity muscles. Sensory.: intact to touch ,pinprick .position and vibratory sensation.  Coordination: Rapid alternating movements normal in all extremities. Finger-to-nose and heel-to-shin performed accurately bilaterally. Gait and  Station: Arises from chair without difficulty. Stance is stooped. Uses a cane to walk.. Gait demonstrates normal stride length and balance . Not able to heel, toe and tandem walk   Reflexes: 1+ and symmetric. Toes downgoing.      ASSESSMENT: 28 year African-American male with left hemispheric TIA in July 2016 with vascular risk factors of hypertension, hyperlipidemia, age and sex. Mild underlying dementia which appears stable.    PLAN: I had a long d/w patient about his recent TIA, risk for recurrent stroke/TIAs, personally independently reviewed imaging studies and stroke evaluation results and answered questions.Continue clopidogrel 75 mg orally every day  for secondary stroke prevention and maintain strict control of hypertension with blood pressure goal below 130/90, diabetes with hemoglobin A1c goal below 6.5% and lipids with LDL cholesterol goal below 100 mg/dL. I also advised the patient to eat a healthy diet with plenty of whole grains, cereals, fruits and vegetables, exercise regularly and maintain ideal body weight .continue Aricept for his mild dementia. Patient was also  advised fall and safety precautions. Patient may also possibly consider participation in the CREAD dementia study if interested.Greater than 50% of time during this 25 minute visit was spent on counseling, discussion with patient and family and coordination of care  Followup in the future with me in 6 months or call earlier if necessary. Antony Contras, MD Note: This document was prepared with digital dictation and possible smart phrase technology. Any transcriptional errors that result from this process are unintentional

## 2015-01-06 ENCOUNTER — Other Ambulatory Visit: Payer: Self-pay | Admitting: Endocrinology

## 2015-01-16 ENCOUNTER — Encounter: Payer: Self-pay | Admitting: Podiatry

## 2015-01-16 ENCOUNTER — Ambulatory Visit (INDEPENDENT_AMBULATORY_CARE_PROVIDER_SITE_OTHER): Payer: Medicare Other | Admitting: Podiatry

## 2015-01-16 DIAGNOSIS — M79676 Pain in unspecified toe(s): Secondary | ICD-10-CM | POA: Diagnosis not present

## 2015-01-16 DIAGNOSIS — E1140 Type 2 diabetes mellitus with diabetic neuropathy, unspecified: Secondary | ICD-10-CM

## 2015-01-16 DIAGNOSIS — B351 Tinea unguium: Secondary | ICD-10-CM

## 2015-01-16 NOTE — Progress Notes (Signed)
Patient ID: [name], male   DOB: 02/09/1924, 81 y.o.   MRN: [id number] HPI  Complaint:  Visit Type: Patient returns to my office for continued preventative foot care services. Complaint: Patient states" my nails have grown long and thick and become painful to walk and wear shoes" Patient has been diagnosed with DM with neuropathy. .. He presents for preventative foot care services. No changes to ROS  Podiatric Exam: Vascular: dorsalis pedis and posterior tibial pulses are negative. Capillary return is immediate. Temperature gradient is negative due to severe foot swelling.. Skin turgor WNL, bilateral swelling  Sensorium: Diminished  Semmes Weinstein monofilament test. Normal tactile sensation bilaterally.  Nail Exam: Pt has thick disfigured discolored nails with subungual debris noted bilateral entire nail hallux through fifth toenails Ulcer Exam: There is no evidence of ulcer or pre-ulcerative changes or infection. Orthopedic Exam: Muscle tone and strength are WNL. No limitations in general ROM. No crepitus or effusions noted. Foot type and digits show no abnormalities. Bony prominences are unremarkable. Skin: No Porokeratosis. No infection or ulcers  Diagnosis:  Tinea unguium, Pain in right toe, pain in left toes  Treatment & Plan Procedures and Treatment: Consent by patient was obtained for treatment procedures. The patient understood the discussion of treatment and procedures well. All questions were answered thoroughly reviewed. Debridement of mycotic and hypertrophic toenails, 1 through 5 bilateral and clearing of subungual debris. No ulceration, no infection noted.  Return Visit-Office Procedure: Patient instructed to return to the office for a follow up visit 3 months for continued evaluation and treatment.

## 2015-04-22 ENCOUNTER — Ambulatory Visit (INDEPENDENT_AMBULATORY_CARE_PROVIDER_SITE_OTHER): Payer: Medicare Other | Admitting: Podiatry

## 2015-04-22 ENCOUNTER — Encounter: Payer: Self-pay | Admitting: Podiatry

## 2015-04-22 DIAGNOSIS — E1140 Type 2 diabetes mellitus with diabetic neuropathy, unspecified: Secondary | ICD-10-CM

## 2015-04-22 DIAGNOSIS — M79676 Pain in unspecified toe(s): Secondary | ICD-10-CM | POA: Diagnosis not present

## 2015-04-22 DIAGNOSIS — B351 Tinea unguium: Secondary | ICD-10-CM | POA: Diagnosis not present

## 2015-04-22 NOTE — Progress Notes (Signed)
Patient ID: Eric [name], male   DOB: 1923/09/12, 82 y.o.   MRN: [id number] HPI  Complaint:  Visit Type: Patient returns to my office for continued preventative foot care services. Complaint: Patient states" my nails have grown long and thick and become painful to walk and wear shoes" Patient has been diagnosed with DM with neuropathy. .. He presents for preventative foot care services. No changes to ROS  Podiatric Exam: Vascular: dorsalis pedis and posterior tibial pulses are negative. Capillary return is immediate. Temperature gradient is negative due to severe foot swelling.. Skin turgor WNL, bilateral swelling  Sensorium: Diminished  Semmes Weinstein monofilament test. Normal tactile sensation bilaterally.  Nail Exam: Pt has thick disfigured discolored nails with subungual debris noted bilateral entire nail hallux through fifth toenails Ulcer Exam: There is no evidence of ulcer or pre-ulcerative changes or infection. Orthopedic Exam: Muscle tone and strength are WNL. No limitations in general ROM. No crepitus or effusions noted. Foot type and digits show no abnormalities. Bony prominences are unremarkable. Skin: No Porokeratosis. No infection or ulcers  Diagnosis:  Tinea unguium, Pain in right toe, pain in left toes  Treatment & Plan Procedures and Treatment: Consent by patient was obtained for treatment procedures. The patient understood the discussion of treatment and procedures well. All questions were answered thoroughly reviewed. Debridement of mycotic and hypertrophic toenails, 1 through 5 bilateral and clearing of subungual debris. No ulceration, no infection noted.  Return Visit-Office Procedure: Patient instructed to return to the office for a follow up visit 3 months for continued evaluation and treatment.   Gardiner Barefoot DPM

## 2015-04-28 ENCOUNTER — Encounter: Payer: Self-pay | Admitting: Cardiovascular Disease

## 2015-05-06 ENCOUNTER — Encounter: Payer: Self-pay | Admitting: Internal Medicine

## 2015-05-21 ENCOUNTER — Ambulatory Visit: Payer: Medicare Other | Admitting: *Deleted

## 2015-06-19 ENCOUNTER — Ambulatory Visit: Payer: Medicare Other | Admitting: *Deleted

## 2015-06-19 ENCOUNTER — Encounter: Payer: Self-pay | Admitting: Neurology

## 2015-06-19 ENCOUNTER — Ambulatory Visit (INDEPENDENT_AMBULATORY_CARE_PROVIDER_SITE_OTHER): Payer: Medicare Other | Admitting: Neurology

## 2015-06-19 VITALS — BP 124/72 | HR 67 | Ht 69.0 in | Wt 172.0 lb

## 2015-06-19 DIAGNOSIS — F0390 Unspecified dementia without behavioral disturbance: Secondary | ICD-10-CM | POA: Diagnosis not present

## 2015-06-19 NOTE — Patient Instructions (Signed)
I had a long discussion with the patient and his sister regarding his remote TIA as well as dementia both of which appear to be stable. I answered questions that they had. Continue aspirin for stroke prevention and maintain strict control of blood pressure with goal below 130/90, lipids with LDL cholesterol goal below 70 mg percent. Continue Aricept for dementia and the current dose of 10 mg daily. I encouraged the patient to go out and socialize and participate in adult daycare program and  activities We also talked about fall and safety prevention precautions. He will return for follow-up in 6 months with nurse practitioner or call earlier if necessary  Fall Prevention in the Home  Falls can cause injuries and can affect people from all age groups. There are many simple things that you can do to make your home safe and to help prevent falls. WHAT CAN I DO ON THE OUTSIDE OF MY HOME?  Regularly repair the edges of walkways and driveways and fix any cracks.  Remove high doorway thresholds.  Trim any shrubbery on the main path into your home.  Use bright outdoor lighting.  Clear walkways of debris and clutter, including tools and rocks.  Regularly check that handrails are securely fastened and in good repair. Both sides of any steps should have handrails.  Install guardrails along the edges of any raised decks or porches.  Have leaves, snow, and ice cleared regularly.  Use sand or salt on walkways during winter months.  In the garage, clean up any spills right away, including grease or oil spills. WHAT CAN I DO IN THE BATHROOM?  Use night lights.  Install grab bars by the toilet and in the tub and shower. Do not use towel bars as grab bars.  Use non-skid mats or decals on the floor of the tub or shower.  If you need to sit down while you are in the shower, use a plastic, non-slip stool.Marland Kitchen  Keep the floor dry. Immediately clean up any water that spills on the floor.  Remove soap  buildup in the tub or shower on a regular basis.  Attach bath mats securely with double-sided non-slip rug tape.  Remove throw rugs and other tripping hazards from the floor. WHAT CAN I DO IN THE BEDROOM?  Use night lights.  Make sure that a bedside light is easy to reach.  Do not use oversized bedding that drapes onto the floor.  Have a firm chair that has side arms to use for getting dressed.  Remove throw rugs and other tripping hazards from the floor. WHAT CAN I DO IN THE KITCHEN?   Clean up any spills right away.  Avoid walking on wet floors.  Place frequently used items in easy-to-reach places.  If you need to reach for something above you, use a sturdy step stool that has a grab bar.  Keep electrical cables out of the way.  Do not use floor polish or wax that makes floors slippery. If you have to use wax, make sure that it is non-skid floor wax.  Remove throw rugs and other tripping hazards from the floor. WHAT CAN I DO IN THE STAIRWAYS?  Do not leave any items on the stairs.  Make sure that there are handrails on both sides of the stairs. Fix handrails that are broken or loose. Make sure that handrails are as long as the stairways.  Check any carpeting to make sure that it is firmly attached to the stairs. Fix any carpet  that is loose or worn.  Avoid having throw rugs at the top or bottom of stairways, or secure the rugs with carpet tape to prevent them from moving.  Make sure that you have a light switch at the top of the stairs and the bottom of the stairs. If you do not have them, have them installed. WHAT ARE SOME OTHER FALL PREVENTION TIPS?  Wear closed-toe shoes that fit well and support your feet. Wear shoes that have rubber soles or low heels.  When you use a stepladder, make sure that it is completely opened and that the sides are firmly locked. Have someone hold the ladder while you are using it. Do not climb a closed stepladder.  Add color or  contrast paint or tape to grab bars and handrails in your home. Place contrasting color strips on the first and last steps.  Use mobility aids as needed, such as canes, walkers, scooters, and crutches.  Turn on lights if it is dark. Replace any light bulbs that burn out.  Set up furniture so that there are clear paths. Keep the furniture in the same spot.  Fix any uneven floor surfaces.  Choose a carpet design that does not hide the edge of steps of a stairway.  Be aware of any and all pets.  Review your medicines with your healthcare provider. Some medicines can cause dizziness or changes in blood pressure, which increase your risk of falling. Talk with your health care provider about other ways that you can decrease your risk of falls. This may include working with a physical therapist or trainer to improve your strength, balance, and endurance.   This information is not intended to replace advice given to you by your health care provider. Make sure you discuss any questions you have with your health care provider.   Document Released: 03/26/2002 Document Revised: 08/20/2014 Document Reviewed: 05/10/2014 Elsevier Interactive Patient Education [employer].

## 2015-06-19 NOTE — Progress Notes (Signed)
[facility] [address] [place]. [place] [address] [phone]       OFFICE FOLLOW-UP NOTE  [name] Date of Birth:  08-21-1923 Medical Record Number:  [id number]   HPI: 82 year African-American male seen today for first office follow-up visit for TIA from hospital admission in July 2016.82yo man with PMH of HTN, GERD< DM, BPH, HLD, CKD, Grade 1 diastolic CHF, dementia who presents for dysarthria. His symptoms started today at about 230pm after church. He was talking to a friend and noticed that his words were garbled. He knew what he was trying to say, but could not get it out. He also had right leg weakness and had to be helped out of his chair. The leg weakness has happened once before, Wednesday of this week, after exercising at the [facility]. His symptoms resolved by the time EMS had arrived. He has never had a stroke before. He denies falling, sensation changes, lightheadedness, dizziness, recent illness, nausea, vomiting, chest pain, SOB. Risk factors for stroke include HTN, DM, HLD. CT head done in the ED showed no acute pathology. MRI showed no acute pathology, mild atherosclerosis. Carotid Doppler showed no significant extracranial stenosis. MRA of the brain showed no large vessel stenosis. LDL cholesterol was elevated and hemoglobin A1c was 6.5. Patient was started on Plavix and is tolerating it well without bleeding or bruising. He also has long-standing history of mild dementia which appears stable on Aricept. He lives at home but his family provides some support and checks on him frequently. He walks with a cane because of his arthritis but has not had any major falls. Update 06/19/2015 : He returns for follow-up after last visit 6 months ago. He is accompanied by his sister and a provides most of the history. Patient continues to live alone but has AIDS during the daytime hours and is alone at night. Patient's sister lives nearby and checks on him daily.  His medications are given to him supervised. He has an emergency and that system. He is had no accident accidents, falls or injuries. The sister feels that he is cognitively doing a little better even though on the Mini-Mental status exam today he scored 18/30 which is a decline from 21/30 at last visit. Patient does not socialize a lot chart and spends most of his time at home. He is involved only in going out for church related activities. He has not had any agitations, delusions, hallucinations or unsafe behavior. He has had no recurrent TIA or stroke symptoms. He is tolerating aspirin without bleeding or bruising. His blood pressure is well controlled and today it is 124/72. He is tolerating Lipitor well without side effects. His fasting sugars have been well controlled. ROS:   14 system review of systems is positive for  fatigue, insomnia, muscle cramps, memory loss, weakness, itching, rash and all other systems negative  PMH:  Past Medical History  Diagnosis Date  . ANEMIA-NOS 02/17/2007  . DEPRESSION 12/25/2008  . HYPERTENSION 02/17/2007  . GERD 02/17/2007  . OSTEOARTHRITIS 03/04/2010  . DIABETES MELLITUS, TYPE I, CONTROLLED, WITH RETINOPATHY   . Gastroparesis   . BENIGN PROSTATIC HYPERTROPHY   . VITAMIN B12 DEFICIENCY   . BACK PAIN, LUMBAR   . LIVER DISORDER   . PSA, INCREASED   . Hyperlipidemia   . Diabetes mellitus   . Pneumonia   . Anxiety   . Encephalopathy, metabolic   . Altered mental status   . CAP (community acquired pneumonia)   .  Adult failure to thrive   . Hyponatremia   . CKD (chronic kidney disease)   . Bilateral leg edema   . Stroke [facility])     Social History:  Social History   Social History  . Marital Status: Widowed    Spouse Name: N/A  . Number of Children: N/A  . Years of Education: N/A   Occupational History  . Not on file.   Social History Main Topics  . Smoking status: Never Smoker   . Smokeless tobacco: Never Used  . Alcohol Use: No  . Drug  Use: No  . Sexual Activity: No   Other Topics Concern  . Not on file   Social History Narrative   Works Armed forces operational officer.   Widowed 2010.   Regular exercise-no    Medications:   Current Outpatient Prescriptions on File Prior to Visit  Medication Sig Dispense Refill  . ACCU-CHEK AVIVA PLUS test strip     . acetaminophen (TYLENOL) 500 MG tablet Take 500 mg by mouth 2 (two) times daily. Reported on 06/19/2015    . aspirin 81 MG EC tablet Take 81 mg by mouth daily.  3  . atorvastatin (LIPITOR) 20 MG tablet Take 1 tablet (20 mg total) by mouth daily at 6 PM. 30 tablet 0  . B-D UF III MINI PEN NEEDLES 31G X 5 MM MISC   3  . calcitRIOL (ROCALTROL) 0.25 MCG capsule Take 0.25 mcg by mouth daily.     . cholecalciferol (VITAMIN D) 1000 UNITS tablet Take 1,000 Units by mouth daily.    . clopidogrel (PLAVIX) 75 MG tablet Take 1 tablet (75 mg total) by mouth daily. 30 tablet 0  . docusate sodium (COLACE) 100 MG capsule Take 100 mg by mouth daily.    Marland Kitchen donepezil (ARICEPT) 10 MG tablet Take 10 mg by mouth at bedtime.    . furosemide (LASIX) 20 MG tablet Take 1 tablet (20 mg total) by mouth 2 (two) times daily. (Patient taking differently: Take 20 mg by mouth daily. ) 30 tablet 2  . hydrALAZINE (APRESOLINE) 25 MG tablet Take 25 mg by mouth 3 (three) times daily.  1  . hydrOXYzine (ATARAX/VISTARIL) 10 MG tablet TAKE 1 TABLET BY MOUTH TWICE DAILY AS NEEDED FOR ITCHING  1  . Insulin Glargine (LANTUS SOLOSTAR) 100 UNIT/ML Solostar Pen Inject 25 Units into the skin daily.     Marland Kitchen lisinopril (PRINIVIL,ZESTRIL) 40 MG tablet Take 1 tablet (40 mg total) by mouth 2 (two) times daily. (Patient taking differently: Take 40 mg by mouth daily. )  3  . loratadine (CLARITIN) 10 MG tablet Take 10 mg by mouth daily with lunch.    . metoprolol succinate (TOPROL-XL) 25 MG 24 hr tablet Take 25 mg by mouth daily.  3  . OVER THE COUNTER MEDICATION Apply 1 application topically daily. Over the counter cream to prevent rash on  buttocks    . phenytoin (DILANTIN) 100 MG ER capsule Take 300 mg by mouth at bedtime.    Vladimir Faster Glycol-Propyl Glycol (SYSTANE ULTRA) 0.4-0.3 % SOLN Place 1 drop into both eyes 3 (three) times daily as needed (dry eyes).    . polyethylene glycol powder (GLYCOLAX/MIRALAX) powder Take 17 g by mouth daily as needed.   12  . senna (SENOKOT) 8.6 MG TABS Take 1 tablet by mouth daily.    . Triamcinolone Acetonide (NASACORT AQ NA) Place 1 spray into both nostrils daily.     No current facility-administered medications on file prior  to visit.    Allergies:   Allergies  Allergen Reactions  . Percocet [Oxycodone-Acetaminophen] Rash    Physical Exam General: Frail elderly African-American male, seated, in no evident distress Head: head normocephalic and atraumatic.  Neck: supple with no carotid or supraclavicular bruits Cardiovascular: regular rate and rhythm, no murmurs Musculoskeletal: Severe kyphosis  Skin:  no rash/petichiae Vascular:  Normal pulses all extremities Filed Vitals:   06/19/15 1432  BP: 124/72  Pulse: 67   Neurologic Exam Mental Status: Awake and fully alert. Oriented to place and time. Recent and remote memory diminished. Attention span, concentration and fund of knowledge diminished. Mood and affect appropriate. Mini-Mental status exam scored 18/30 ( last visit 21/30)with deficits in orientation, attention, calculation and recall. Animal naming test scored 9. Clock drawing 1/4. Cranial Nerves: Fundoscopic exam reveals sharp disc margins. Pupils equal, briskly reactive to light. Extraocular movements full without nystagmus. Visual fields full to confrontation. Hearing intact. Facial sensation intact. Face, tongue, palate moves normally and symmetrically.  Motor: Normal bulk and tone. Normal strength in all tested extremity muscles. Sensory.: intact to touch ,pinprick .position and vibratory sensation.  Coordination: Rapid alternating movements normal in all extremities.  Finger-to-nose and heel-to-shin performed accurately bilaterally. Gait and Station: Arises from chair without difficulty. Stance is stooped. Uses a cane to walk.. Gait demonstrates normal stride length and balance . Not able to heel, toe and tandem walk   Reflexes: 1+ and symmetric. Toes downgoing.      ASSESSMENT: 58 year African-American male with left hemispheric TIA in July 2016 with vascular risk factors of hypertension, hyperlipidemia, age and sex. Mild underlying dementia which appears stable.    PLAN:  had a long discussion with the patient and his sister regarding his remote TIA as well as dementia both of which appear to be stable. I answered questions that they had. Continue aspirin for stroke prevention and maintain strict control of blood pressure with goal below 130/90, lipids with LDL cholesterol goal below 70 mg percent. Continue Aricept for dementia and the current dose of 10 mg daily. I encouraged the patient to go out and socialize and participate in adult daycare program and  activities We also talked about fall and safety prevention precautions. He will return for follow-up in 6 months with nurse practitioner or call earlier if necessary  Antony Contras, MD Note: This document was prepared with digital dictation and possible smart phrase technology. Any transcriptional errors that result from this process are unintentional

## 2015-07-22 ENCOUNTER — Encounter: Payer: Self-pay | Admitting: Podiatry

## 2015-07-22 ENCOUNTER — Ambulatory Visit (INDEPENDENT_AMBULATORY_CARE_PROVIDER_SITE_OTHER): Payer: Medicare Other | Admitting: Podiatry

## 2015-07-22 DIAGNOSIS — E1140 Type 2 diabetes mellitus with diabetic neuropathy, unspecified: Secondary | ICD-10-CM

## 2015-07-22 DIAGNOSIS — B351 Tinea unguium: Secondary | ICD-10-CM | POA: Diagnosis not present

## 2015-07-22 DIAGNOSIS — M79676 Pain in unspecified toe(s): Secondary | ICD-10-CM

## 2015-07-22 NOTE — Progress Notes (Signed)
Patient ID: Eric [name], male   DOB: 04-08-1924, 82 y.o.   MRN: [id number] HPI  Complaint:  Visit Type: Patient returns to my office for continued preventative foot care services. Complaint: Patient states" my nails have grown long and thick and become painful to walk and wear shoes" Patient has been diagnosed with DM with neuropathy. .. He presents for preventative foot care services. No changes to ROS  Podiatric Exam: Vascular: dorsalis pedis and posterior tibial pulses are negative. Capillary return is immediate. Temperature gradient is negative due to severe foot swelling.. Skin turgor WNL, bilateral swelling  Sensorium: Diminished  Semmes Weinstein monofilament test. Normal tactile sensation bilaterally.  Nail Exam: Pt has thick disfigured discolored nails with subungual debris noted bilateral entire nail hallux through fifth toenails Ulcer Exam: There is no evidence of ulcer or pre-ulcerative changes or infection. Orthopedic Exam: Muscle tone and strength are WNL. No limitations in general ROM. No crepitus or effusions noted. Foot type and digits show no abnormalities. Bony prominences are unremarkable. Skin: No Porokeratosis. No infection or ulcers  Diagnosis:  Tinea unguium, Pain in right toe, pain in left toes  Treatment & Plan Procedures and Treatment: Consent by patient was obtained for treatment procedures. The patient understood the discussion of treatment and procedures well. All questions were answered thoroughly reviewed. Debridement of mycotic and hypertrophic toenails, 1 through 5 bilateral and clearing of subungual debris. No ulceration, no infection noted.  Return Visit-Office Procedure: Patient instructed to return to the office for a follow up visit 3 months for continued evaluation and treatment.   Gardiner Barefoot DPM

## 2015-08-13 ENCOUNTER — Other Ambulatory Visit: Payer: Self-pay | Admitting: Cardiology

## 2015-08-13 DIAGNOSIS — I872 Venous insufficiency (chronic) (peripheral): Secondary | ICD-10-CM

## 2015-08-26 ENCOUNTER — Ambulatory Visit
Admission: RE | Admit: 2015-08-26 | Discharge: 2015-08-26 | Disposition: A | Discharge status: Home / Self Care | Payer: Medicare Other | Source: Ambulatory Visit | Attending: Cardiology | Admitting: Cardiology

## 2015-08-26 DIAGNOSIS — I872 Venous insufficiency (chronic) (peripheral): Secondary | ICD-10-CM

## 2015-08-26 NOTE — Consult Note (Signed)
Chief Complaint: Patient was seen in consultation today for  Chief Complaint  Patient presents with  . Aphasia   at the request of * No referring provider recorded for this case *  Referring Physician(s): Einar Gip, MD.  History of Present Illness: Eric [name] is a 82 y.o. male who is had lower extremity swelling for 5 months. He presented at the end of last year and was admitted for CHF. His symptoms have continued since then despite upping his diuretic treatments. He is also followed by cardiology and his primary care physician. He does wear TED hose that extend to the upper calf.  Past Medical History  Diagnosis Date  . ANEMIA-NOS 02/17/2007  . DEPRESSION 12/25/2008  . HYPERTENSION 02/17/2007  . GERD 02/17/2007  . OSTEOARTHRITIS 03/04/2010  . DIABETES MELLITUS, TYPE I, CONTROLLED, WITH RETINOPATHY   . Gastroparesis   . BENIGN PROSTATIC HYPERTROPHY   . VITAMIN B12 DEFICIENCY   . BACK PAIN, LUMBAR   . LIVER DISORDER   . PSA, INCREASED   . Hyperlipidemia   . Diabetes mellitus   . Pneumonia   . Anxiety   . Encephalopathy, metabolic   . Altered mental status   . CAP (community acquired pneumonia)   . Adult failure to thrive   . Hyponatremia   . CKD (chronic kidney disease)   . Bilateral leg edema   . Stroke [facility])     Past Surgical History  Procedure Laterality Date  . Transurethral resection of prostate    . Left heart catheterization with coronary angiogram N/A 04/23/2011    Procedure: LEFT HEART CATHETERIZATION WITH CORONARY ANGIOGRAM;  Surgeon: Peter M Martinique, MD;  Location: [facility] CATH LAB;  Service: Cardiovascular;  Laterality: N/A;    Allergies: Percocet  Medications: Prior to Admission medications   Medication Sig Start Date End Date Taking? Authorizing Provider  acetaminophen (TYLENOL) 500 MG tablet Take 500 mg by mouth 2 (two) times daily. Reported on 06/19/2015   Yes Historical Provider, MD  calcitRIOL (ROCALTROL) 0.25 MCG capsule Take 0.25 mcg by mouth  daily.  12/26/11  Yes Historical Provider, MD  cholecalciferol (VITAMIN D) 1000 UNITS tablet Take 1,000 Units by mouth daily.   Yes Historical Provider, MD  docusate sodium (COLACE) 100 MG capsule Take 100 mg by mouth daily.   Yes Historical Provider, MD  donepezil (ARICEPT) 10 MG tablet Take 10 mg by mouth at bedtime.   Yes Historical Provider, MD  Insulin Glargine (LANTUS SOLOSTAR) 100 UNIT/ML Solostar Pen Inject 25 Units into the skin daily.    Yes Historical Provider, MD  loratadine (CLARITIN) 10 MG tablet Take 10 mg by mouth daily with lunch.   Yes Historical Provider, MD  metoprolol succinate (TOPROL-XL) 25 MG 24 hr tablet Take 25 mg by mouth daily. 02/18/14  Yes Historical Provider, MD  OVER THE COUNTER MEDICATION Apply 1 application topically daily. Over the counter cream to prevent rash on buttocks   Yes Historical Provider, MD  phenytoin (DILANTIN) 100 MG ER capsule Take 300 mg by mouth at bedtime.   Yes Historical Provider, MD  Polyethyl Glycol-Propyl Glycol (SYSTANE ULTRA) 0.4-0.3 % SOLN Place 1 drop into both eyes 3 (three) times daily as needed (dry eyes).   Yes Historical Provider, MD  polyethylene glycol powder (GLYCOLAX/MIRALAX) powder Take 17 g by mouth daily as needed.  05/18/14  Yes Historical Provider, MD  senna (SENOKOT) 8.6 MG TABS Take 1 tablet by mouth daily.   Yes Historical Provider,  MD  Triamcinolone Acetonide (NASACORT AQ NA) Place 1 spray into both nostrils daily.   Yes Historical Provider, MD  ACCU-CHEK AVIVA PLUS test strip  05/01/14   Historical Provider, MD  amLODipine (NORVASC) 5 MG tablet Take 5 mg by mouth daily.    Historical Provider, MD  aspirin 81 MG EC tablet Take 81 mg by mouth daily. 11/07/14   Historical Provider, MD  atorvastatin (LIPITOR) 20 MG tablet Take 1 tablet (20 mg total) by mouth daily at 6 PM. 11/13/14   Charlynne Cousins, MD  B-D UF III MINI PEN NEEDLES 31G X 5 MM MISC  06/20/14   Historical Provider, MD  clopidogrel (PLAVIX) 75 MG tablet Take 1  tablet (75 mg total) by mouth daily. 11/13/14   Charlynne Cousins, MD  furosemide (LASIX) 20 MG tablet Take 1 tablet (20 mg total) by mouth 2 (two) times daily. Patient taking differently: Take 20 mg by mouth daily.  11/15/14   Charlynne Cousins, MD  hydrALAZINE (APRESOLINE) 25 MG tablet Take 25 mg by mouth 3 (three) times daily. 10/22/14   Historical Provider, MD  hydrOXYzine (ATARAX/VISTARIL) 10 MG tablet TAKE 1 TABLET BY MOUTH TWICE DAILY AS NEEDED FOR ITCHING 12/10/14   Historical Provider, MD  lisinopril (PRINIVIL,ZESTRIL) 40 MG tablet Take 1 tablet (40 mg total) by mouth 2 (two) times daily. Patient taking differently: Take 40 mg by mouth daily.  11/17/14   Charlynne Cousins, MD  pantoprazole (PROTONIX) 40 MG tablet  05/04/15   Historical Provider, MD     Family History  Problem Relation Age of Onset  . Cancer Father     had uncertain type of cancer  . Cancer Brother     Prostate Cancer  . Cancer Brother     Prostate Cancer  . Diabetes Other     Social History   Social History  . Marital Status: Widowed    Spouse Name: N/A  . Number of Children: N/A  . Years of Education: N/A   Social History Main Topics  . Smoking status: Never Smoker   . Smokeless tobacco: Never Used  . Alcohol Use: No  . Drug Use: No  . Sexual Activity: No   Other Topics Concern  . None   Social History Narrative   Works Armed forces operational officer.   Widowed 2010.   Regular exercise-no     Review of Systems: A 12 point ROS discussed and pertinent positives are indicated in the HPI above.  All other systems are negative.  Review of Systems  Vital Signs: BP 149/67 mmHg  Pulse 71  Temp(Src) 98.8 F (37.1 C) (Oral)  Resp 18  Ht 5\' 10"  (1.778 m)  Wt 169 lb 8.5 oz (76.9 kg)  BMI 24.33 kg/m2  SpO2 100%  Physical Exam  Mallampati Score:     Imaging: No results found.  Venous Doppler today was performed. This demonstrates patency of the common femoral veins. There is chronic DVT in the  bilateral femoral and popliteal veins characterize by hyperechoic and heterogeneous thrombus with mature recannulization channels. There is no definite acute appearing thrombus. Labs:  CBC:  Recent Labs  11/09/14 1634  WBC 4.2  HGB 10.7*  HCT 31.7*  PLT 150    COAGS:  Recent Labs  11/09/14 1634  INR 1.09  APTT 20*    BMP:  Recent Labs  11/09/14 1634 11/11/14 1124 11/12/14 0620 11/13/14 0404  NA 135 137 138 135  K 3.8 3.6 3.6 4.0  CL  103 101 102 104  CO2 25 28 28 25   GLUCOSE 100* 160* 121* 171*  BUN 29* 30* 28* 28*  CALCIUM 8.8* 9.4 8.9 8.4*  CREATININE 1.71* 1.72* 1.61* 1.59*  GFRNONAA 34* 34* 37* 37*  GFRAA 39* 39* 42* 43*    LIVER FUNCTION TESTS:  Recent Labs  11/09/14 1634  BILITOT 0.4  AST 19  ALT 16*  ALKPHOS 120  PROT 6.8  ALBUMIN 3.1*    TUMOR MARKERS: No results for input(s): AFPTM, CEA, CA199, CHROMGRNA in the last 8760 hours.  Assessment and Plan:  Mr. Presutti lower extremity edema secondary to DVT which is now chronic. This likely developed at the end of last year. He has developed recannulization channels through the bilateral lower extremity DVT. This is the source of his bilateral lower extremity venous insufficiency. Currently, he is on Plavix. Given that the DVT is chronic, urgent anticoagulation is probably not indicated at this time. I will defer to his primary care physician and cardiologist regarding further management of chronic DVT. He was given 20-30 mm gradient thigh high compression stockings to aid in his venous insufficiency.  Thank you for this interesting consult.  I greatly enjoyed meeting Eric [name] and look forward to participating in their care.  A copy of this report was sent to the requesting provider on this date.  Electronically Signed: Chandy Tarman, ART A 08/26/2015, 4:10 PM   I spent a total of  40 Minutes   in face to face in clinical consultation, greater than 50% of which was counseling/coordinating care for  venous insufficiency secondary to chronic DVT.

## 2015-10-24 ENCOUNTER — Encounter: Payer: Self-pay | Admitting: Internal Medicine

## 2015-12-23 ENCOUNTER — Encounter: Payer: Self-pay | Admitting: Nurse Practitioner

## 2015-12-23 ENCOUNTER — Ambulatory Visit (INDEPENDENT_AMBULATORY_CARE_PROVIDER_SITE_OTHER): Payer: Medicare Other | Admitting: Nurse Practitioner

## 2015-12-23 VITALS — BP 181/74 | HR 81 | Ht 70.0 in | Wt 176.0 lb

## 2015-12-23 DIAGNOSIS — R413 Other amnesia: Secondary | ICD-10-CM

## 2015-12-23 DIAGNOSIS — I10 Essential (primary) hypertension: Secondary | ICD-10-CM | POA: Diagnosis not present

## 2015-12-23 DIAGNOSIS — G459 Transient cerebral ischemic attack, unspecified: Secondary | ICD-10-CM | POA: Diagnosis not present

## 2015-12-23 MED ORDER — DONEPEZIL HCL 10 MG PO TABS: 10.0000 mg | ORAL_TABLET | Freq: Every day | ORAL | 6 refills | Status: DC

## 2015-12-23 NOTE — Progress Notes (Signed)
[facility]  PATIENT: Eric [name] DOB: 1923/09/07   REASON FOR VISIT: follow up for memory loss HISTORY FROM: patient and sister    HISTORY OF PRESENT ILLNESS:89 year African-American male seen today for first office follow-up visit for TIA from hospital admission in July 2016.82yo man with PMH of HTN, GERD< DM, BPH, HLD, CKD, Grade 1 diastolic CHF, dementia who presents for dysarthria. His symptoms started today at about 230pm after church. He was talking to a friend and noticed that his words were garbled. He knew what he was trying to say, but could not get it out. He also had right leg weakness and had to be helped out of his chair. The leg weakness has happened once before, Wednesday of this week, after exercising at the [facility]. His symptoms resolved by the time EMS had arrived. He has never had a stroke before. He denies falling, sensation changes, lightheadedness, dizziness, recent illness, nausea, vomiting, chest pain, SOB. Risk factors for stroke include HTN, DM, HLD. CT head done in the ED showed no acute pathology. MRI showed no acute pathology, mild atherosclerosis. Carotid Doppler showed no significant extracranial stenosis. MRA of the brain showed no large vessel stenosis. LDL cholesterol was elevated and hemoglobin A1c was 6.5. Patient was started on Plavix and is tolerating it well without bleeding or bruising. He also has long-standing history of mild dementia which appears stable on Aricept. He lives at home but his family provides some support and checks on him frequently. He walks with a cane because of his arthritis but has not had any major falls. Update 06/19/2015 : He returns for follow-up after last visit 6 months ago. He is accompanied by his sister and a provides most of the history. Patient continues to live alone but has AIDS during the daytime hours and is alone at night. Patient's sister lives nearby and checks on him daily. His medications  are given to him supervised. He has an emergency and that system. He is had no accident accidents, falls or injuries. The sister feels that he is cognitively doing a little better even though on the Mini-Mental status exam today he scored 18/30 which is a decline from 21/30 at last visit. Patient does not socialize a lot chart and spends most of his time at home. He is involved only in going out for church related activities. He has not had any agitations, delusions, hallucinations or unsafe behavior. He has had no recurrent TIA or stroke symptoms. He is tolerating aspirin without bleeding or bruising. His blood pressure is well controlled and today it is 124/72. He is tolerating Lipitor well without side effects. His fasting sugars have been well controlled UPDATE 9/5/17CM Eric [name], 82 year old male returns for follow-up with his sister. Patient continues to live alone but she checks on him frequently. He has assistance during the day. No recent falls. They both feel his memory is better at present. No wandering behavior and difficulty sleeping no hallucinations. He has not had recurrent TIA or stroke symptoms and is tolerating aspirin without significant bruising. He is also on Lipitor without muscle aches or myalgias. He returns for reevaluation  REVIEW OF SYSTEMS: Full 14 system review of systems performed and notable only for those listed, all others are neg:  Constitutional: Fatigue  Cardiovascular: neg Ear/Nose/Throat: neg  Skin: neg Eyes: neg Respiratory: Shortness of breath Gastroitestinal: neg  Hematology/Lymphatic: neg  Endocrine: neg Musculoskeletal: Walking difficulty Allergy/Immunology: neg Neurological: Memory loss Psychiatric: neg Sleep : neg  ALLERGIES: Allergies  Allergen Reactions  . Percocet [Oxycodone-Acetaminophen] Rash    HOME MEDICATIONS: Outpatient Medications Prior to Visit  Medication Sig Dispense Refill  . ACCU-CHEK AVIVA PLUS test strip     .  acetaminophen (TYLENOL) 500 MG tablet Take 500 mg by mouth 2 (two) times daily. Reported on 06/19/2015    . atorvastatin (LIPITOR) 20 MG tablet Take 1 tablet (20 mg total) by mouth daily at 6 PM. 30 tablet 0  . B-D UF III MINI PEN NEEDLES 31G X 5 MM MISC   3  . calcitRIOL (ROCALTROL) 0.25 MCG capsule Take 0.25 mcg by mouth daily.     . cholecalciferol (VITAMIN D) 1000 UNITS tablet Take 1,000 Units by mouth daily.    Marland Kitchen docusate sodium (COLACE) 100 MG capsule Take 100 mg by mouth daily.    Marland Kitchen donepezil (ARICEPT) 10 MG tablet Take 10 mg by mouth at bedtime.    . furosemide (LASIX) 20 MG tablet Take 1 tablet (20 mg total) by mouth 2 (two) times daily. (Patient taking differently: Take 20 mg by mouth daily. ) 30 tablet 2  . hydrALAZINE (APRESOLINE) 25 MG tablet Take 75 mg by mouth 3 (three) times daily.   1  . hydrOXYzine (ATARAX/VISTARIL) 10 MG tablet TAKE 1 TABLET BY MOUTH TWICE DAILY AS NEEDED FOR ITCHING  1  . Insulin Glargine (LANTUS SOLOSTAR) 100 UNIT/ML Solostar Pen Inject 32 Units into the skin daily.     Marland Kitchen lisinopril (PRINIVIL,ZESTRIL) 40 MG tablet Take 1 tablet (40 mg total) by mouth 2 (two) times daily. (Patient taking differently: Take 20 mg by mouth daily. )  3  . loratadine (CLARITIN) 10 MG tablet Take 10 mg by mouth daily with lunch.    . metoprolol succinate (TOPROL-XL) 25 MG 24 hr tablet Take 25 mg by mouth daily.  3  . OVER THE COUNTER MEDICATION Apply 1 application topically daily. Over the counter cream to prevent rash on buttocks    . pantoprazole (PROTONIX) 40 MG tablet     . phenytoin (DILANTIN) 100 MG ER capsule Take 300 mg by mouth at bedtime.    Vladimir Faster Glycol-Propyl Glycol (SYSTANE ULTRA) 0.4-0.3 % SOLN Place 1 drop into both eyes 3 (three) times daily as needed (dry eyes).    . polyethylene glycol powder (GLYCOLAX/MIRALAX) powder Take 17 g by mouth daily as needed.   12  . senna (SENOKOT) 8.6 MG TABS Take 1 tablet by mouth daily.    . Triamcinolone Acetonide (NASACORT AQ  NA) Place 1 spray into both nostrils daily.    Marland Kitchen amLODipine (NORVASC) 5 MG tablet Take 5 mg by mouth daily.    Marland Kitchen aspirin 81 MG EC tablet Take 81 mg by mouth daily.  3  . clopidogrel (PLAVIX) 75 MG tablet Take 1 tablet (75 mg total) by mouth daily. 30 tablet 0   No facility-administered medications prior to visit.     PAST MEDICAL HISTORY: Past Medical History:  Diagnosis Date  . Adult failure to thrive   . Altered mental status   . ANEMIA-NOS 02/17/2007  . Anxiety   . BACK PAIN, LUMBAR   . BENIGN PROSTATIC HYPERTROPHY   . Bilateral leg edema   . CAP (community acquired pneumonia)   . CKD (chronic kidney disease)   . DEPRESSION 12/25/2008  . Diabetes mellitus   . DIABETES MELLITUS, TYPE I, CONTROLLED, WITH RETINOPATHY   . Encephalopathy, metabolic   . Gastroparesis   . GERD 02/17/2007  . Hyperlipidemia   .  HYPERTENSION 02/17/2007  . Hyponatremia   . LIVER DISORDER   . OSTEOARTHRITIS 03/04/2010  . Pneumonia   . PSA, INCREASED   . Stroke ([place])   . VITAMIN B12 DEFICIENCY     PAST SURGICAL HISTORY: Past Surgical History:  Procedure Laterality Date  . LEFT HEART CATHETERIZATION WITH CORONARY ANGIOGRAM N/A 04/23/2011   Procedure: LEFT HEART CATHETERIZATION WITH CORONARY ANGIOGRAM;  Surgeon: Peter M Martinique, MD;  Location[facility] CATH LAB;  Service: Cardiovascular;  Laterality: N/A;  . TRANSURETHRAL RESECTION OF PROSTATE      FAMILY HISTORY: Family History  Problem Relation Age of Onset  . Cancer Father     had uncertain type of cancer  . Cancer Brother     Prostate Cancer  . Cancer Brother     Prostate Cancer  . Diabetes Other     SOCIAL HISTORY: Social History   Social History  . Marital status: Widowed    Spouse name: N/A  . Number of children: N/A  . Years of education: N/A   Occupational History  . Not on file.   Social History Main Topics  . Smoking status: Never Smoker  . Smokeless tobacco: Never Used  . Alcohol use No  . Drug use: No  . Sexual  activity: No   Other Topics Concern  . Not on file   Social History Narrative   Works [employer] forces operational officer.   Widowed 2010.   Regular exercise-no     PHYSICAL EXAM  Vitals:   12/23/15 1431  BP: (!) 181/74  Pulse: 81  Weight: 176 lb (79.8 kg)  Height: 5\' 10"  (1.778 m)   Body mass index is 25.25 kg/m. General: Frail elderly African-American male, seated, in no evident distress Head: head normocephalic and atraumatic.  Neck: supple with no carotid or supraclavicular bruits Cardiovascular: regular rate and rhythm, no murmurs Musculoskeletal: Severe kyphosis  Skin:  no rash/petichiae Vascular:  Normal pulses all extremities   Neurological examination  Mental Status: Awake and fully alert. Oriented to place and time. Recent and remote memory diminished. Attention span, concentration and fund of knowledge diminished. Mood and affect appropriate. Mini-Mental status exam score 23/30 Last  18/30.Animal naming test scored 5.. Clock drawing 1/4. Cranial Nerves: Fundoscopic exam reveals sharp disc margins. Pupils equal, briskly reactive to light. Extraocular movements full without nystagmus. Visual fields full to confrontation. Hearing intact. Facial sensation intact. Face, tongue, palate moves normally and symmetrically.  Motor: Normal bulk and tone. Normal strength in all tested extremity muscles. Sensory.: intact to touch ,pinprick .position and vibratory sensation.  Coordination: Rapid alternating movements normal in all extremities. Finger-to-nose and heel-to-shin performed accurately bilaterally. Gait and Station: Arises from chair without difficulty. Stance is stooped. Uses a cane to walk.. Gait demonstrates normal stride length and balance . Not able to heel, toe and tandem walk   Reflexes: 1+ and symmetric. Toes downgoing.   DIAGNOSTIC DATA (LABS, IMAGING, TESTING) - I reviewed patient records, labs, notes, testing and imaging myself where available.  Lab Results  Component Value  Date   WBC 4.2 11/09/2014   HGB 10.7 (L) 11/09/2014   HCT 31.7 (L) 11/09/2014   MCV 98.4 11/09/2014   PLT 150 11/09/2014      Component Value Date/Time   NA 135 11/13/2014 0404   K 4.0 11/13/2014 0404   CL 104 11/13/2014 0404   CO2 25 11/13/2014 0404   GLUCOSE 171 (H) 11/13/2014 0404   BUN 28 (H) 11/13/2014 0404   CREATININE 1.59 (H) 11/13/2014  0404   CALCIUM 8.4 (L) 11/13/2014 0404   CALCIUM 8.8 04/29/2011 1005   PROT 6.8 11/09/2014 1634   ALBUMIN 3.1 (L) 11/09/2014 1634   AST 19 11/09/2014 1634   ALT 16 (L) 11/09/2014 1634   ALKPHOS 120 11/09/2014 1634   BILITOT 0.4 11/09/2014 1634   GFRNONAA 37 (L) 11/13/2014 0404   GFRAA 43 (L) 11/13/2014 0404   Lab Results  Component Value Date   CHOL 159 11/10/2014   HDL 31 (L) 11/10/2014   LDLCALC 106 (H) 11/10/2014   LDLDIRECT 40.5 10/28/2006   TRIG 112 11/10/2014   CHOLHDL 5.1 11/10/2014   Lab Results  Component Value Date   HGBA1C 6.5 (H) 11/10/2014   Lab Results  Component Value Date   VITAMINB12 1,644 (H) 10/18/2011   Lab Results  Component Value Date   TSH 1.608 01/06/2012      ASSESSMENT AND PLAN  41 year African-American male with left hemispheric TIA in July 2016 with vascular risk factors of hypertension, hyperlipidemia, age and sex. Mild underlying dementia which appears stable.The patient is a current patient of Dr. Leonie Man  who is out of the office today . This note is sent to the work in doctor.     Continue Aricept at current dose Continue aspirin and Plavix for secondary stroke prevention Control of blood pressure with goal below 130/90 Lipids with LDL cholesterol below 70% continue Lipitor Use cane at all times for safe ambulation and to prevent falls Follow up in 6 months Dennie Bible, [facility], [facility], [facility] [address] [place], [place] [address] [phone]

## 2015-12-23 NOTE — Patient Instructions (Signed)
Continue Aricept at current dose Follow up in 6 months

## 2015-12-23 NOTE — Progress Notes (Signed)
I reviewed above note and agree with the assessment and plan.  Rosalin Hawking, MD PhD Stroke Neurology 12/23/2015 5:47 PM

## 2016-01-28 ENCOUNTER — Other Ambulatory Visit: Payer: Self-pay | Admitting: Cardiology

## 2016-01-28 DIAGNOSIS — I825Y3 Chronic embolism and thrombosis of unspecified deep veins of proximal lower extremity, bilateral: Secondary | ICD-10-CM

## 2016-04-29 ENCOUNTER — Ambulatory Visit
Admission: RE | Admit: 2016-04-29 | Discharge: 2016-04-29 | Disposition: A | Discharge status: Home / Self Care | Payer: Medicare Other | Source: Ambulatory Visit | Attending: Cardiology | Admitting: Cardiology

## 2016-04-29 DIAGNOSIS — I825Y3 Chronic embolism and thrombosis of unspecified deep veins of proximal lower extremity, bilateral: Secondary | ICD-10-CM

## 2016-06-22 ENCOUNTER — Encounter (INDEPENDENT_AMBULATORY_CARE_PROVIDER_SITE_OTHER): Payer: Self-pay

## 2016-06-22 ENCOUNTER — Encounter: Payer: Self-pay | Admitting: Nurse Practitioner

## 2016-06-22 ENCOUNTER — Ambulatory Visit (INDEPENDENT_AMBULATORY_CARE_PROVIDER_SITE_OTHER): Payer: Medicare Other | Admitting: Nurse Practitioner

## 2016-06-22 ENCOUNTER — Ambulatory Visit: Payer: Medicare Other | Admitting: Nurse Practitioner

## 2016-06-22 VITALS — BP 140/80 | HR 79 | Wt 185.0 lb

## 2016-06-22 DIAGNOSIS — I10 Essential (primary) hypertension: Secondary | ICD-10-CM | POA: Diagnosis not present

## 2016-06-22 DIAGNOSIS — E785 Hyperlipidemia, unspecified: Secondary | ICD-10-CM

## 2016-06-22 DIAGNOSIS — R413 Other amnesia: Secondary | ICD-10-CM | POA: Diagnosis not present

## 2016-06-22 DIAGNOSIS — G459 Transient cerebral ischemic attack, unspecified: Secondary | ICD-10-CM | POA: Diagnosis not present

## 2016-06-22 MED ORDER — DONEPEZIL HCL 10 MG PO TABS: 10.0000 mg | ORAL_TABLET | Freq: Every day | ORAL | 6 refills | Status: DC

## 2016-06-22 NOTE — Patient Instructions (Addendum)
Continue Aricept at current dose will refill then obtain from PCP Continue Coumadin for secondary stroke prevention and DVT Control of blood pressure with goal below 130/90todays reading 140/80 Lipids with LDL cholesterol below 70% continue Lipitor Use cane at all times for safe ambulation and to prevent falls Will discharge from neurology , no further stroke symptoms since 2016 REMEMBER Pneumonic FAST which stands for  F stands  for face drooping and weakness etc. A stands for arms, weakness S stands for speech slurred  T stands for time to call 911

## 2016-06-22 NOTE — Progress Notes (Signed)
I agree with the assessment and plan as directed by NP .The patient is known to me .   Vonetta Foulk, MD  

## 2016-06-22 NOTE — Progress Notes (Signed)
[facility]  PATIENT: Eric [name] DOB: 17-Nov-1923   REASON FOR VISIT: follow up for TIA  , memory loss HISTORY FROM: patient and caregiver  HISTORY OF PRESENT ILLNESS: HISTORY 12/30/14 PS89 year African-American male seen today for first office follow-up visit for TIA from hospital admission in July 2016.83yo man with PMH of HTN, GERD< DM, BPH, HLD, CKD, Grade 1 diastolic CHF, dementia who presents for dysarthria. His symptoms started today at about 230pm after church. He was talking to a friend and noticed that his words were garbled. He knew what he was trying to say, but could not get it out. He also had right leg weakness and had to be helped out of his chair. The leg weakness has happened once before, Wednesday of this week, after exercising at the [facility]. His symptoms resolved by the time EMS had arrived. He has never had a stroke before. He denies falling, sensation changes, lightheadedness, dizziness, recent illness, nausea, vomiting, chest pain, SOB. Risk factors for stroke include HTN, DM, HLD. CT head done in the ED showed no acute pathology. MRI showed no acute pathology, mild atherosclerosis. Carotid Doppler showed no significant extracranial stenosis. MRA of the brain showed no large vessel stenosis. LDL cholesterol was elevated and hemoglobin A1c was 6.5. Patient was started on Plavix and is tolerating it well without bleeding or bruising. He also has long-standing history of mild dementia which appears stable on Aricept. He lives at home but his family provides some support and checks on him frequently. He walks with a cane because of his arthritis but has not had any major falls. Update 06/19/2015 PS: He returns for follow-up after last visit 6 months ago. He is accompanied by his sister and a provides most of the history. Patient continues to live alone but has AIDS during the daytime hours and is alone at night. Patient's sister lives nearby and checks on  him daily. His medications are given to him supervised. He has an emergency and that system. He is had no accident accidents, falls or injuries. The sister feels that he is cognitively doing a little better even though on the Mini-Mental status exam today he scored 18/30 which is a decline from 21/30 at last visit. Patient does not socialize a lot chart and spends most of his time at home. He is involved only in going out for church related activities. He has not had any agitations, delusions, hallucinations or unsafe behavior. He has had no recurrent TIA or stroke symptoms. He is tolerating aspirin without bleeding or bruising. His blood pressure is well controlled and today it is 124/72. He is tolerating Lipitor well without side effects. His fasting sugars have been well controlled UPDATE 9/5/17CM [name], 83 year old male returns for follow-up with his sister. Patient continues to live alone but she checks on him frequently. He has assistance during the day. No recent falls. They both feel his memory is better at present. No wandering behavior and difficulty sleeping no hallucinations. He has not had recurrent TIA or stroke symptoms and is tolerating aspirin without significant bruising. He is also on Lipitor without muscle aches or myalgias. He returns for reevaluation UPDATE 03/06/2018CM Mr. Brothers, 83 year old male returns for follow-up with his caregiver. He has a history of TIA  In July 2016. He is currently on Coumadin for secondary stroke prevention and hx of DVT.  He also has a history of chronic lower extremity edema secondary to DVT. His aspirin was changed to Coumadin  by his primary care. Patient continues to live in his own home but has someone checks on him frequently to administer medications and prepare meals. He also needs some help with bathing and dressing. Blood pressure in the office today 140/80. He remains on Lipitor for hypercholesterolemia, denies any myalgias. He continues to have  memory loss which predated his stroke. He is on Aricept 10 mg daily. He continues to be involved in church activities. Caregiver reports there have been no safety issues His caregiver feels his memory is stable. He returns for reevaluation  REVIEW OF SYSTEMS: Full 14 system review of systems performed and notable only for those listed, all others are neg:  Constitutional: Fatigue  Cardiovascular: neg Ear/Nose/Throat: neg  Skin: neg Eyes: neg Respiratory: Shortness of breath Gastroitestinal: neg  Hematology/Lymphatic: neg  Endocrine: neg Musculoskeletal: Walking difficulty, joint pain Allergy/Immunology: neg Neurological: Memory loss Psychiatric: neg Sleep : neg   ALLERGIES: Allergies  Allergen Reactions  . Percocet [Oxycodone-Acetaminophen] Rash    HOME MEDICATIONS: Outpatient Medications Prior to Visit  Medication Sig Dispense Refill  . ACCU-CHEK AVIVA PLUS test strip     . acetaminophen (TYLENOL) 500 MG tablet Take 500 mg by mouth 2 (two) times daily. Reported on 06/19/2015    . atorvastatin (LIPITOR) 20 MG tablet Take 1 tablet (20 mg total) by mouth daily at 6 PM. 30 tablet 0  . B-D UF III MINI PEN NEEDLES 31G X 5 MM MISC   3  . calcitRIOL (ROCALTROL) 0.25 MCG capsule Take 0.25 mcg by mouth daily.     . cholecalciferol (VITAMIN D) 1000 UNITS tablet Take 1,000 Units by mouth daily.    . cloNIDine (CATAPRES - DOSED IN MG/24 HR) 0.2 mg/24hr patch     . docusate sodium (COLACE) 100 MG capsule Take 100 mg by mouth daily.    Marland Kitchen donepezil (ARICEPT) 10 MG tablet Take 1 tablet (10 mg total) by mouth at bedtime. 30 tablet 6  . furosemide (LASIX) 20 MG tablet Take 1 tablet (20 mg total) by mouth 2 (two) times daily. (Patient taking differently: Take 20 mg by mouth daily. ) 30 tablet 2  . hydrALAZINE (APRESOLINE) 25 MG tablet Take 75 mg by mouth 3 (three) times daily.   1  . hydrOXYzine (ATARAX/VISTARIL) 10 MG tablet TAKE 1 TABLET BY MOUTH TWICE DAILY AS NEEDED FOR ITCHING  1  . Insulin  Glargine (LANTUS SOLOSTAR) 100 UNIT/ML Solostar Pen Inject 32 Units into the skin daily.     Marland Kitchen lisinopril (PRINIVIL,ZESTRIL) 40 MG tablet Take 1 tablet (40 mg total) by mouth 2 (two) times daily. (Patient taking differently: Take 20 mg by mouth daily. )  3  . loratadine (CLARITIN) 10 MG tablet Take 10 mg by mouth daily with lunch.    . metoprolol succinate (TOPROL-XL) 25 MG 24 hr tablet Take 25 mg by mouth daily.  3  . OVER THE COUNTER MEDICATION Apply 1 application topically daily. Over the counter cream to prevent rash on buttocks    . pantoprazole (PROTONIX) 40 MG tablet     . phenytoin (DILANTIN) 100 MG ER capsule Take 300 mg by mouth at bedtime.    Vladimir Faster Glycol-Propyl Glycol (SYSTANE ULTRA) 0.4-0.3 % SOLN Place 1 drop into both eyes 3 (three) times daily as needed (dry eyes).    . polyethylene glycol powder (GLYCOLAX/MIRALAX) powder Take 17 g by mouth daily as needed.   12  . senna (SENOKOT) 8.6 MG TABS Take 1 tablet by mouth daily.    Marland Kitchen  Triamcinolone Acetonide (NASACORT AQ NA) Place 1 spray into both nostrils daily.    Marland Kitchen warfarin (COUMADIN) 2 MG tablet 4 mg one time only at 6 PM. 4 mg daily wxcept mondays 6mg      No facility-administered medications prior to visit.     PAST MEDICAL HISTORY: Past Medical History:  Diagnosis Date  . Adult failure to thrive   . Altered mental status   . ANEMIA-NOS 02/17/2007  . Anxiety   . BACK PAIN, LUMBAR   . BENIGN PROSTATIC HYPERTROPHY   . Bilateral leg edema   . CAP (community acquired pneumonia)   . CKD (chronic kidney disease)   . DEPRESSION 12/25/2008  . Diabetes mellitus   . DIABETES MELLITUS, TYPE I, CONTROLLED, WITH RETINOPATHY   . Encephalopathy, metabolic   . Gastroparesis   . GERD 02/17/2007  . Hyperlipidemia   . HYPERTENSION 02/17/2007  . Hyponatremia   . LIVER DISORDER   . OSTEOARTHRITIS 03/04/2010  . Pneumonia   . PSA, INCREASED   . Stroke ([place])   . VITAMIN B12 DEFICIENCY     PAST SURGICAL HISTORY: Past Surgical  History:  Procedure Laterality Date  . LEFT HEART CATHETERIZATION WITH CORONARY ANGIOGRAM N/A 04/23/2011   Procedure: LEFT HEART CATHETERIZATION WITH CORONARY ANGIOGRAM;  Surgeon: Peter M Martinique, MD;  Location: [facility] CATH LAB;  Service: Cardiovascular;  Laterality: N/A;  . TRANSURETHRAL RESECTION OF PROSTATE      FAMILY HISTORY: Family History  Problem Relation Age of Onset  . Cancer Father     had uncertain type of cancer  . Cancer Brother     Prostate Cancer  . Cancer Brother     Prostate Cancer  . Diabetes Other     SOCIAL HISTORY: Social History   Social History  . Marital status: Widowed    Spouse name: N/A  . Number of children: N/A  . Years of education: N/A   Occupational History  . Not on file.   Social History Main Topics  . Smoking status: Never Smoker  . Smokeless tobacco: Never Used  . Alcohol use No  . Drug use: No  . Sexual activity: No   Other Topics Concern  . Not on file   Social History Narrative   Works [employer] forces operational officer.   Widowed 2010.   Regular exercise-no     PHYSICAL EXAM  Vitals:   06/22/16 0854  BP: (!) 140/80  Pulse: 79  Weight: 185 lb (83.9 kg)   Body mass index is 26.54 kg/m.  Generalized: Well developed, elderly male in no acute distress  Head: normocephalic and atraumatic,. Oropharynx benign  Neck: Supple, no carotid bruits  Cardiac: Regular rate rhythm, no murmur  Musculoskeletal: Severe kyphosis  Neurological examination   Mentation: Alert oriented to time, place, MMSE 16/30. AFT 7. Last 18/30. history taking. Attention span and concentration appropriate. Recent and remote memory intact.  Follows all commands speech and language fluent.   Cranial nerve II-XII: Fundoscopic exam reveals sharp disc margins.Pupils were equal round reactive to light extraocular movements were full, visual field were full on confrontational test. Facial sensation and strength were normal. hearing was intact to finger rubbing bilaterally. Uvula  tongue midline. head turning and shoulder shrug were normal and symmetric.Tongue protrusion into cheek strength was normal. Motor: normal bulk and tone, full strength in the BUE, BLE, fine finger movements normal, no pronator drift. Sensory: normal and symmetric to light touch, pinprick, and  Vibration, in the upper and lower extremities Coordination: finger-nose-finger, heel-to-shin bilaterally,  no dysmetria, no tremor Reflexes: 1+ upper lower and symmetric, plantar responses were flexor bilaterally. Gait and Station: Rising up from seated position with assistance, stance is stooped uses a quad cane.   Un able to perform tiptoe, and heel walking without difficulty. Tandem gait was not attempted  DIAGNOSTIC DATA (LABS, IMAGING, TESTING) - I reviewed patient records, labs, notes, testing and imaging myself where available.  Lab Results  Component Value Date   WBC 4.2 11/09/2014   HGB 10.7 (L) 11/09/2014   HCT 31.7 (L) 11/09/2014   MCV 98.4 11/09/2014   PLT 150 11/09/2014      Component Value Date/Time   NA 135 11/13/2014 0404   K 4.0 11/13/2014 0404   CL 104 11/13/2014 0404   CO2 25 11/13/2014 0404   GLUCOSE 171 (H) 11/13/2014 0404   BUN 28 (H) 11/13/2014 0404   CREATININE 1.59 (H) 11/13/2014 0404   CALCIUM 8.4 (L) 11/13/2014 0404   CALCIUM 8.8 04/29/2011 1005   PROT 6.8 11/09/2014 1634   ALBUMIN 3.1 (L) 11/09/2014 1634   AST 19 11/09/2014 1634   ALT 16 (L) 11/09/2014 1634   ALKPHOS 120 11/09/2014 1634   BILITOT 0.4 11/09/2014 1634   GFRNONAA 37 (L) 11/13/2014 0404   GFRAA 43 (L) 11/13/2014 0404   Lab Results  Component Value Date   CHOL 159 11/10/2014   HDL 31 (L) 11/10/2014   LDLCALC 106 (H) 11/10/2014   LDLDIRECT 40.5 10/28/2006   TRIG 112 11/10/2014   CHOLHDL 5.1 11/10/2014   Lab Results  Component Value Date   HGBA1C 6.5 (H) 11/10/2014       ASSESSMENT AND PLAN  62 year African-American male with left hemispheric TIA in July 2016 with vascular risk factors  of hypertension, hyperlipidemia, age and sex. Chronic DVT.  Mild underlying dementia which appears stable.The patient is a current patient of Dr. Leonie Man  who is out of the office today . This note is sent to the work in doctor.     Continue Aricept at current dose will refill then obtain from PCP Continue Coumadin  for secondary stroke prevention Control of blood pressure with goal below 130/90todays reading 140/80 Lipids with LDL cholesterol below 70% continue Lipitor Use cane at all times for safe ambulation and to prevent falls Will discharge from neurology , no further stroke symptoms since July 2016 REMEMBER Pneumonic FAST which stands for  F stands  for face drooping and weakness etc. A stands for arms, weakness S stands for speech slurred  T stands for time to call 911 I spent 20 min in total face to face time with the patient more than 50% of which was spent counseling and coordination of care, reviewing test results reviewing medications and discussing and reviewing the diagnosis of stroke and risk factor modification.  Dennie Bible, [facility], [facility], APRN  [facility] [address], [place], [place] [address] [phone]

## 2016-09-28 ENCOUNTER — Encounter: Payer: Self-pay | Admitting: Podiatry

## 2016-09-28 ENCOUNTER — Ambulatory Visit (INDEPENDENT_AMBULATORY_CARE_PROVIDER_SITE_OTHER): Payer: Medicare Other | Admitting: Podiatry

## 2016-09-28 DIAGNOSIS — Z794 Long term (current) use of insulin: Secondary | ICD-10-CM

## 2016-09-28 DIAGNOSIS — B351 Tinea unguium: Secondary | ICD-10-CM | POA: Diagnosis not present

## 2016-09-28 DIAGNOSIS — W450XXA Nail entering through skin, initial encounter: Secondary | ICD-10-CM

## 2016-09-28 DIAGNOSIS — M79676 Pain in unspecified toe(s): Secondary | ICD-10-CM

## 2016-09-28 DIAGNOSIS — E1140 Type 2 diabetes mellitus with diabetic neuropathy, unspecified: Secondary | ICD-10-CM

## 2016-09-28 NOTE — Progress Notes (Signed)
Patient ID: Eric [name], male   DOB: [birth year], 83 y.o.   MRN: [id number] HPI  Complaint:  Visit Type: Patient returns to my office for continued preventative foot care services. Complaint: Patient states" my nails have grown long and thick and become painful to walk and wear shoes" Patient has been diagnosed with DM with neuropathy. .. He presents for preventative foot care services. No changes to ROS.  He says there is bleeding from under the left big toenail which is unattached to nail bed.  Podiatric Exam: Vascular: dorsalis pedis and posterior tibial pulses are negative. Capillary return is immediate. Temperature gradient is negative due to severe foot swelling.. Skin turgor WNL, bilateral swelling  Sensorium: Diminished  Semmes Weinstein monofilament test. Normal tactile sensation bilaterally.  Nail Exam: Pt has thick disfigured discolored nails with subungual debris noted bilateral entire nail hallux through fifth toenails Ulcer Exam: There is no evidence of ulcer or pre-ulcerative changes or infection. Orthopedic Exam: Muscle tone and strength are WNL. No limitations in general ROM. No crepitus or effusions noted. Foot type and digits show no abnormalities. Bony prominences are unremarkable. Skin: No Porokeratosis. No infection or ulcers  Diagnosis:  Tinea unguium, Pain in right toe, pain in left toes Left hallux nail injury  Treatment & Plan Procedures and Treatment: Consent by patient was obtained for treatment procedures. The patient understood the discussion of treatment and procedures well. All questions were answered thoroughly reviewed. Debridement of mycotic and hypertrophic toenails, 1 through 5 bilateral and clearing of subungual debris. No ulceration, no infection noted. Removal left hallux nail plate.  Neosporin/DSD Return Visit-Office Procedure: Patient instructed to return to the office for a follow up visit 3 months for continued evaluation and treatment.   Gardiner Barefoot DPM

## 2016-10-02 ENCOUNTER — Emergency Department (HOSPITAL_COMMUNITY): Payer: Medicare Other

## 2016-10-02 ENCOUNTER — Observation Stay (HOSPITAL_COMMUNITY)
Admission: EM | Admit: 2016-10-02 | Discharge: 2016-10-04 | Disposition: A | Discharge status: Home / Self Care | Payer: Medicare Other | Attending: Family Medicine | Admitting: Family Medicine

## 2016-10-02 ENCOUNTER — Encounter (HOSPITAL_COMMUNITY): Payer: Self-pay | Admitting: Emergency Medicine

## 2016-10-02 DIAGNOSIS — I872 Venous insufficiency (chronic) (peripheral): Secondary | ICD-10-CM | POA: Insufficient documentation

## 2016-10-02 DIAGNOSIS — Z86718 Personal history of other venous thrombosis and embolism: Secondary | ICD-10-CM | POA: Insufficient documentation

## 2016-10-02 DIAGNOSIS — E1022 Type 1 diabetes mellitus with diabetic chronic kidney disease: Secondary | ICD-10-CM | POA: Diagnosis not present

## 2016-10-02 DIAGNOSIS — N390 Urinary tract infection, site not specified: Principal | ICD-10-CM | POA: Insufficient documentation

## 2016-10-02 DIAGNOSIS — R509 Fever, unspecified: Secondary | ICD-10-CM | POA: Diagnosis present

## 2016-10-02 DIAGNOSIS — Z7901 Long term (current) use of anticoagulants: Secondary | ICD-10-CM | POA: Insufficient documentation

## 2016-10-02 DIAGNOSIS — I5032 Chronic diastolic (congestive) heart failure: Secondary | ICD-10-CM | POA: Insufficient documentation

## 2016-10-02 DIAGNOSIS — I130 Hypertensive heart and chronic kidney disease with heart failure and stage 1 through stage 4 chronic kidney disease, or unspecified chronic kidney disease: Secondary | ICD-10-CM | POA: Insufficient documentation

## 2016-10-02 DIAGNOSIS — Z79899 Other long term (current) drug therapy: Secondary | ICD-10-CM | POA: Diagnosis not present

## 2016-10-02 DIAGNOSIS — E10319 Type 1 diabetes mellitus with unspecified diabetic retinopathy without macular edema: Secondary | ICD-10-CM | POA: Insufficient documentation

## 2016-10-02 DIAGNOSIS — E119 Type 2 diabetes mellitus without complications: Secondary | ICD-10-CM

## 2016-10-02 DIAGNOSIS — R0982 Postnasal drip: Secondary | ICD-10-CM | POA: Diagnosis not present

## 2016-10-02 DIAGNOSIS — Z8673 Personal history of transient ischemic attack (TIA), and cerebral infarction without residual deficits: Secondary | ICD-10-CM | POA: Insufficient documentation

## 2016-10-02 DIAGNOSIS — I2510 Atherosclerotic heart disease of native coronary artery without angina pectoris: Secondary | ICD-10-CM | POA: Diagnosis not present

## 2016-10-02 DIAGNOSIS — K219 Gastro-esophageal reflux disease without esophagitis: Secondary | ICD-10-CM | POA: Diagnosis not present

## 2016-10-02 DIAGNOSIS — N3001 Acute cystitis with hematuria: Secondary | ICD-10-CM

## 2016-10-02 DIAGNOSIS — E1043 Type 1 diabetes mellitus with diabetic autonomic (poly)neuropathy: Secondary | ICD-10-CM | POA: Insufficient documentation

## 2016-10-02 DIAGNOSIS — N183 Chronic kidney disease, stage 3 unspecified: Secondary | ICD-10-CM | POA: Diagnosis present

## 2016-10-02 DIAGNOSIS — E785 Hyperlipidemia, unspecified: Secondary | ICD-10-CM | POA: Insufficient documentation

## 2016-10-02 DIAGNOSIS — K3184 Gastroparesis: Secondary | ICD-10-CM | POA: Insufficient documentation

## 2016-10-02 DIAGNOSIS — R7989 Other specified abnormal findings of blood chemistry: Secondary | ICD-10-CM

## 2016-10-02 DIAGNOSIS — R748 Abnormal levels of other serum enzymes: Secondary | ICD-10-CM | POA: Insufficient documentation

## 2016-10-02 DIAGNOSIS — F0390 Unspecified dementia without behavioral disturbance: Secondary | ICD-10-CM | POA: Insufficient documentation

## 2016-10-02 DIAGNOSIS — I10 Essential (primary) hypertension: Secondary | ICD-10-CM | POA: Diagnosis present

## 2016-10-02 DIAGNOSIS — R778 Other specified abnormalities of plasma proteins: Secondary | ICD-10-CM

## 2016-10-02 LAB — CBG MONITORING, ED: Glucose-Capillary: 112 mg/dL — ABNORMAL HIGH (ref 65–99)

## 2016-10-02 MED ORDER — SODIUM CHLORIDE 0.9 % IV BOLUS (SEPSIS)
1000.0000 mL | Freq: Once | INTRAVENOUS | Status: AC
  Administered 2016-10-03: 1000 mL via INTRAVENOUS

## 2016-10-02 NOTE — ED Triage Notes (Signed)
Pt comes from home via ems, c/o was hypoglycemia and fever. Pt did not want to come in, ems resolved sugar to 98, after giving him food. Pt had a home temp of 99.8. ( after tynenlol given in last 4 hrs.  Pt has IV in left forearm 18.   V/s on arrival 128./58, pulse 76, rr18, cbg 98.  spo2,99 room air. Alert x4 arousal but sleepy.

## 2016-10-02 NOTE — ED Provider Notes (Signed)
[facility] DEPT Provider Note   CSN: [id number] Arrival date & time: 10/02/16  2126  By signing my name below, I, Levester Fresh, attest that this documentation has been prepared under the direction and in the presence of Deno Etienne, DO . Electronically Signed: Levester Fresh, Scribe. 10/03/2016. 3:08 AM.  History   Chief Complaint Chief Complaint  Patient presents with  . Fever    possiable infection temp 99.8   HPI Comments Eric [name] is a 83 y.o. male with a PMHx significant for TIA, HTN, GERD, DM, BPH, HLD, CKD, Grade 1 diastolic CHF and dementia, who presents to the Emergency Department with complaints of fever, x1 day, improved by use of Tylenol PTA.  Pt's wife, who is at bedside and the primary historian, notes that pt has been increasingly weak over the last week, culminating in an episode of near collapse earlier today.  Per wife, pt's "legs just wouldn't hold him" after returning home from church.  She also reports that pt has experienced a cough x1 week, urinary incontinence/ urgency and congestion.  Pt also with reported increase in confusion per home health aide.  No back pain or consistent bowel incontinence.  No abdominal pain or emesis.  Normal PO intake.  LE edema at baseline with an unknown etiology, per pt's wife "old blood clots" not resolved by blood thinners or coumadin. Pt denies experiencing any other acute sx.  The history is provided by the patient and the spouse. No language interpreter was used.   Past Medical History:  Diagnosis Date  . Adult failure to thrive   . Altered mental status   . ANEMIA-NOS 02/17/2007  . Anxiety   . BACK PAIN, LUMBAR   . BENIGN PROSTATIC HYPERTROPHY   . Bilateral leg edema   . CAP (community acquired pneumonia)   . CKD (chronic kidney disease)   . DEPRESSION 12/25/2008  . Diabetes mellitus   . DIABETES MELLITUS, TYPE I, CONTROLLED, WITH RETINOPATHY   . Encephalopathy, metabolic   . Gastroparesis   . GERD 02/17/2007    . Hyperlipidemia   . HYPERTENSION 02/17/2007  . Hyponatremia   . LIVER DISORDER   . OSTEOARTHRITIS 03/04/2010  . Pneumonia   . PSA, INCREASED   . Stroke ([place])   . VITAMIN B12 DEFICIENCY     Patient Active Problem List   Diagnosis Date Noted  . Memory loss 06/22/2016  . AKI (acute kidney injury) ([place]) 11/12/2014  . TIA (transient ischemic attack) 11/09/2014  . Seizure ([place]) 02/08/2012  . Lesion of lung 02/08/2012  . DM2 (diabetes mellitus, type 2) ([place]) 02/08/2012  . Hypertensive encephalopathy 01/06/2012  . Hyperglycemia 01/06/2012  . MGUS (monoclonal gammopathy of unknown significance) 10/28/2011  . Bilateral leg edema 10/28/2011  . Fall at home 10/28/2011  . Failure to thrive in adult 10/28/2011  . Type 2 diabetes mellitus with hypoglycemia ([place]) 10/27/2011  . Dysphagia 10/27/2011  . Encephalopathy 10/27/2011  . Hypernatremia 10/24/2011  . Altered mental status 10/20/2011  . Generalized weakness 10/18/2011  . Encephalopathy, metabolic   . Dependent edema 10/02/2011  . Hyponatremia 10/02/2011  . Pneumonia 04/24/2011  . CKD (chronic kidney disease) stage 3, GFR 30-59 ml/min 04/19/2011  . Bronchitis, acute 04/16/2011  . Edema 03/30/2011  . OSTEOARTHRITIS 03/04/2010  . LIVER DISORDER 12/09/2009  . BACK PAIN, LUMBAR 09/09/2009  . ABDOMINAL PAIN, UNSPECIFIED SITE 01/31/2009  . SYNCOPE 01/08/2009  . ELBOW PAIN, LEFT 12/30/2008  . ABNORMAL ELECTROCARDIOGRAM 12/30/2008  . DEPRESSION 12/25/2008  .  WEIGHT LOSS 12/25/2008  . NOSEBLEED 12/25/2008  . VITAMIN B12 DEFICIENCY 10/24/2008  . DM (diabetes mellitus), type 1, uncontrolled w/ophthalmic complication ([place]) 44/06/4740  . PANCYTOPENIA 10/08/2008  . PSA, INCREASED 10/08/2008  . DYSURIA 10/03/2008  . LEG PAIN, BILATERAL 12/26/2007  . KNEE PAIN, LEFT 08/18/2007  . COUGH 04/18/2007  . Hyperlipidemia 02/17/2007  . ANEMIA-NOS 02/17/2007  . Essential hypertension 02/17/2007  . ALLERGIC RHINITIS 02/17/2007  . GERD  02/17/2007  . GASTROPARESIS 02/17/2007  . Benign prostatic hyperplasia 02/17/2007  . HYPERURICEMIA 02/17/2007    Past Surgical History:  Procedure Laterality Date  . LEFT HEART CATHETERIZATION WITH CORONARY ANGIOGRAM N/A 04/23/2011   Procedure: LEFT HEART CATHETERIZATION WITH CORONARY ANGIOGRAM;  Surgeon: Peter M Martinique, MD;  Location[facility] CATH LAB;  Service: Cardiovascular;  Laterality: N/A;  . TRANSURETHRAL RESECTION OF PROSTATE         Home Medications    Prior to Admission medications   Medication Sig Start Date End Date Taking? Authorizing Provider  acetaminophen (TYLENOL) 500 MG tablet Take 500 mg by mouth 2 (two) times daily. Reported on 06/19/2015   Yes [provider]  amLODipine (NORVASC) 10 MG tablet Take 10 mg by mouth daily.  05/26/16  Yes [provider]  atorvastatin (LIPITOR) 20 MG tablet Take 1 tablet (20 mg total) by mouth daily at 6 PM. 11/13/14  Yes Charlynne Cousins, MD  calcitRIOL (ROCALTROL) 0.25 MCG capsule Take 0.25 mcg by mouth daily.  12/26/11  Yes [provider]  cholecalciferol (VITAMIN D) 1000 UNITS tablet Take 2,000 Units by mouth daily.    Yes [provider]  cloNIDine (CATAPRES) 0.1 MG tablet Take 0.1 mg by mouth 2 (two) times daily as needed.  05/03/16  Yes [provider]  docusate sodium (COLACE) 100 MG capsule Take 100 mg by mouth daily.   Yes [provider]  donepezil (ARICEPT) 10 MG tablet Take 1 tablet (10 mg total) by mouth at bedtime. 06/22/16  Yes Dennie Bible, NP  furosemide (LASIX) 20 MG tablet Take 1 tablet (20 mg total) by mouth 2 (two) times daily. Patient taking differently: Take 40 mg by mouth daily.  11/15/14  Yes Charlynne Cousins, MD  hydrALAZINE (APRESOLINE) 25 MG tablet Take 75 mg by mouth 3 (three) times daily.  10/22/14  Yes [provider]  hydrOXYzine (ATARAX/VISTARIL) 10 MG tablet TAKE 1 TABLET BY MOUTH TWICE DAILY AS NEEDED FOR ITCHING 12/10/14  Yes [provider]  Insulin Glargine (LANTUS SOLOSTAR) 100 UNIT/ML Solostar Pen Inject 32 Units into the skin daily.    Yes [provider]  lisinopril (PRINIVIL,ZESTRIL) 40 MG tablet Take 1 tablet (40 mg total) by mouth 2 (two) times daily. Patient taking differently: Take 20 mg by mouth daily.  11/17/14  Yes Charlynne Cousins, MD  loratadine (CLARITIN) 10 MG tablet Take 10 mg by mouth daily with lunch.   Yes [provider]  LUMIGAN 0.01 % SOLN Place 1 drop into both eyes at bedtime. 08/03/16  Yes [provider]  metoprolol succinate (TOPROL-XL) 25 MG 24 hr tablet Take 25 mg by mouth daily. 02/18/14  Yes [provider]  pantoprazole (PROTONIX) 40 MG tablet  05/04/15  Yes [provider]  phenytoin (DILANTIN) 100 MG ER capsule Take 300 mg by mouth at bedtime.   Yes [provider]  Polyethyl Glycol-Propyl Glycol (SYSTANE ULTRA) 0.4-0.3 % SOLN Place 1 drop into both eyes 3 (three) times daily as needed (dry eyes).  Yes [provider]  polyethylene glycol powder (GLYCOLAX/MIRALAX) powder Take 17 g by mouth daily as needed.  05/18/14  Yes [provider]  senna (SENOKOT) 8.6 MG TABS Take 1 tablet by mouth daily.   Yes [provider]  Triamcinolone Acetonide (NASACORT AQ NA) Place 1 spray into both nostrils daily.   Yes [provider]  warfarin (COUMADIN) 2 MG tablet Take 3-6 mg by mouth See admin instructions. Take 6mg  by mouth once daily every day except for Tuesdays and Thursdays. Take 3mg  on Tuesdays and Thursdays 12/11/15  Yes [provider]  [place] test strip  05/01/14   [provider]  B-D UF III MINI PEN NEEDLES 31G X 5 MM MISC  06/20/14   [provider]  OVER THE COUNTER MEDICATION Apply 1 application topically daily. Over the counter cream to prevent rash on buttocks    [provider]    Family History Family History  Problem Relation Age of Onset  .  Cancer Father        had uncertain type of cancer  . Cancer Brother        Prostate Cancer  . Cancer Brother        Prostate Cancer  . Diabetes Other     Social History Social History  Substance Use Topics  . Smoking status: Never Smoker  . Smokeless tobacco: Never Used  . Alcohol use No     Allergies   Percocet [oxycodone-acetaminophen]   Review of Systems Review of Systems  Constitutional: Positive for fatigue and fever. Negative for chills.  HENT: Positive for congestion. Negative for facial swelling.   Eyes: Negative for discharge and visual disturbance.  Respiratory: Positive for cough. Negative for shortness of breath.   Cardiovascular: Positive for leg swelling. Negative for chest pain and palpitations.  Gastrointestinal: Negative for abdominal pain, diarrhea, nausea and vomiting.  Genitourinary: Positive for urgency.  Musculoskeletal: Negative for arthralgias, back pain and myalgias.  Skin: Negative for color change and rash.  Neurological: Positive for weakness. Negative for tremors, syncope and headaches.  Psychiatric/Behavioral: Positive for confusion. Negative for dysphoric mood.  All other systems reviewed and are negative.  Physical Exam Updated Vital Signs BP (!) 158/59 (BP Location: Right Arm)   Pulse 87   Temp 97.6 F (36.4 C) (Oral)   Resp 18   SpO2 96%   Physical Exam  Constitutional: He is oriented to person, place, and time. He appears well-developed and well-nourished.  Chronically ill appearing.  HENT:  Head: Normocephalic and atraumatic.  Rhinorrhea. Swollen turbinates.  Mild post oropharyngeal erythema.  Clear TM's.  Eyes: EOM are normal. Pupils are equal, round, and reactive to light.  Neck: Normal range of motion. Neck supple. No JVD present.  Cardiovascular: Normal rate and regular rhythm.  Exam reveals no gallop and no friction rub.   No murmur heard. Pulmonary/Chest: Effort normal. No respiratory distress. He has no wheezes.    Abdominal: He exhibits no distension. There is no rebound and no guarding.  Musculoskeletal: Normal range of motion.  4+ pitting edema to bilateral lower extremities up to the thighs.  Neurological: He is alert and oriented to person, place, and time.  Skin: No rash noted. No pallor.  Psychiatric: He has a normal mood and affect. His behavior is normal.  Nursing note and vitals reviewed.  ED Treatments / Results  DIAGNOSTIC STUDIES: Oxygen Saturation is 99% on room air, normal by my interpretation.    COORDINATION OF  CARE: 11:28 PM Discussed treatment plan with pt and wife at bedside.  They agreed to plan.  Labs (all labs ordered are listed, but only abnormal results are displayed) Labs Reviewed  CBC WITH DIFFERENTIAL/PLATELET - Abnormal; Notable for the following:       Result Value   WBC 11.4 (*)    RBC 3.15 (*)    Hemoglobin 10.5 (*)    HCT 30.4 (*)    Neutro Abs 9.9 (*)    All other components within normal limits  COMPREHENSIVE METABOLIC PANEL - Abnormal; Notable for the following:    Glucose, Bld 204 (*)    BUN 38 (*)    Creatinine, Ser 1.97 (*)    Albumin 3.1 (*)    Alkaline Phosphatase 128 (*)    GFR calc non Af Amer 28 (*)    GFR calc Af Amer 33 (*)    All other components within normal limits  URINALYSIS, ROUTINE W REFLEX MICROSCOPIC - Abnormal; Notable for the following:    Protein, ur TRACE (*)    Nitrite POSITIVE (*)    Leukocytes, UA TRACE (*)    All other components within normal limits  URINALYSIS, MICROSCOPIC (REFLEX) - Abnormal; Notable for the following:    Bacteria, UA MANY (*)    Squamous Epithelial / LPF 0-5 (*)    All other components within normal limits  CBG MONITORING, ED - Abnormal; Notable for the following:    Glucose-Capillary 112 (*)    All other components within normal limits  I-STAT CHEM 8, ED - Abnormal; Notable for the following:    BUN 42 (*)    Creatinine, Ser 2.00 (*)    Glucose, Bld 199 (*)    Hemoglobin 10.5 (*)    HCT  31.0 (*)    All other components within normal limits  I-STAT TROPOININ, ED - Abnormal; Notable for the following:    Troponin i, poc 0.16 (*)    All other components within normal limits  URINE CULTURE  CBG MONITORING, ED  I-STAT CG4 LACTIC ACID, ED  I-STAT CG4 LACTIC ACID, ED    EKG  EKG Interpretation  Date/Time:  Saturday October 02 2016 23:58:19 EDT Ventricular Rate:  80 PR Interval:    QRS Duration: 88 QT Interval:  410 QTC Calculation: 473 R Axis:   11 Text Interpretation:  Sinus rhythm Prolonged PR interval Minimal ST depression, lateral leads No significant change since last tracing Confirmed by Deno Etienne [phone]) on 10/03/2016 12:19:32 AM       Radiology Dg Chest 2 View  Result Date: 10/02/2016 CLINICAL DATA:  Acute onset of fever and hypoglycemia. Initial encounter. EXAM: CHEST  2 VIEW COMPARISON:  Chest radiograph performed 11/09/2014 FINDINGS: The lungs are well-aerated and clear. There is no evidence of focal opacification, pleural effusion or pneumothorax. The heart is borderline normal in size. No acute osseous abnormalities are seen. IMPRESSION: No acute cardiopulmonary process seen. Electronically Signed   By: Garald Balding M.D.   On: 10/02/2016 23:28    Procedures Procedures (including critical care time) Procedure note: Ultrasound Guided Peripheral IV Ultrasound guided peripheral 1.88 inch angiocath IV placement performed by me. Indications: Nursing unable to place IV. Details: The antecubital fossa and upper arm were evaluated with a multifrequency linear probe. Patent brachial veins were noted. 1 attempt was made to cannulate a vein under realtime US guidance with successful cannulation of the vein and catheter placement. There is return of non-pulsatile dark red blood. The patient tolerated the  procedure well without complications. Images archived electronically.  CPT codes: [phone] and [phone]  Medications Ordered in ED Medications  sodium chloride 0.9 %  bolus 1,000 mL (0 mLs Intravenous Stopped 10/03/16 0424)  cefTRIAXone (ROCEPHIN) 1 g in dextrose 5 % 50 mL IVPB (0 g Intravenous Stopped 10/03/16 0246)  aspirin chewable tablet 324 mg (324 mg Oral Given 10/03/16 0423)     Initial Impression / Assessment and Plan / ED Course  I have reviewed the triage vital signs and the nursing notes.  Pertinent labs & imaging results that were available during my care of the patient were reviewed by me and considered in my medical decision making (see chart for details).     83 yo M With a chief complaints of fever. This is noted home after he had a syncopal event. The patient had been urinating on himself for the past couple days as well. Having a cough. Per the family they're having difficulty taking care of him for the past day. UA with a urinary tract infection. Will give Rocephin.Admit.   The patient was also noted to have a positive troponin. He denied chest pain shortness of breath. EKG with no concerning findings. Will give aspirin.  CRITICAL CARE Performed by: Cecilio Asper   Total critical care time: 35 minutes  Critical care time was exclusive of separately billable procedures and treating other patients.  Critical care was necessary to treat or prevent imminent or life-threatening deterioration.  Critical care was time spent personally by me on the following activities: development of treatment plan with patient and/or surrogate as well as nursing, discussions with consultants, evaluation of patient's response to treatment, examination of patient, obtaining history from patient or surrogate, ordering and performing treatments and interventions, ordering and review of laboratory studies, ordering and review of radiographic studies, pulse oximetry and re-evaluation of patient's condition.  The patients results and plan were reviewed and discussed.   Any x-rays performed were independently reviewed by myself.   Differential diagnosis  were considered with the presenting HPI.  Medications  sodium chloride 0.9 % bolus 1,000 mL (0 mLs Intravenous Stopped 10/03/16 0424)  cefTRIAXone (ROCEPHIN) 1 g in dextrose 5 % 50 mL IVPB (0 g Intravenous Stopped 10/03/16 0246)  aspirin chewable tablet 324 mg (324 mg Oral Given 10/03/16 0423)    Vitals:   10/02/16 2230 10/02/16 2353 10/03/16 0202 10/03/16 0421  BP: (!) 124/54 (!) 149/67 (!) 146/57 (!) 158/59  Pulse: 82 80 83 87  Resp: 18 15 16 18   Temp:    97.6 F (36.4 C)  TempSrc:    Oral  SpO2: 96% 99% 100% 96%    Final diagnoses:  Acute cystitis with hematuria  Elevated troponin    Admission/ observation were discussed with the admitting physician, patient and/or family and they are comfortable with the plan.    Final Clinical Impressions(s) / ED Diagnoses   Final diagnoses:  Acute cystitis with hematuria  Elevated troponin   I personally performed the services described in this documentation, which was scribed in my presence. The recorded information has been reviewed and is accurate.   New Prescriptions New Prescriptions   No medications on file     Deno Etienne, DO 10/03/16 0502

## 2016-10-03 ENCOUNTER — Observation Stay (HOSPITAL_BASED_OUTPATIENT_CLINIC_OR_DEPARTMENT_OTHER): Payer: Medicare Other

## 2016-10-03 ENCOUNTER — Encounter (HOSPITAL_COMMUNITY): Payer: Self-pay | Admitting: *Deleted

## 2016-10-03 DIAGNOSIS — I5032 Chronic diastolic (congestive) heart failure: Secondary | ICD-10-CM | POA: Diagnosis not present

## 2016-10-03 DIAGNOSIS — E1122 Type 2 diabetes mellitus with diabetic chronic kidney disease: Secondary | ICD-10-CM | POA: Diagnosis not present

## 2016-10-03 DIAGNOSIS — N3000 Acute cystitis without hematuria: Secondary | ICD-10-CM | POA: Diagnosis not present

## 2016-10-03 DIAGNOSIS — I360 Nonrheumatic tricuspid (valve) stenosis: Secondary | ICD-10-CM

## 2016-10-03 DIAGNOSIS — I130 Hypertensive heart and chronic kidney disease with heart failure and stage 1 through stage 4 chronic kidney disease, or unspecified chronic kidney disease: Secondary | ICD-10-CM | POA: Diagnosis not present

## 2016-10-03 DIAGNOSIS — R748 Abnormal levels of other serum enzymes: Secondary | ICD-10-CM | POA: Diagnosis not present

## 2016-10-03 DIAGNOSIS — I10 Essential (primary) hypertension: Secondary | ICD-10-CM | POA: Diagnosis not present

## 2016-10-03 DIAGNOSIS — N390 Urinary tract infection, site not specified: Secondary | ICD-10-CM | POA: Diagnosis not present

## 2016-10-03 DIAGNOSIS — Z794 Long term (current) use of insulin: Secondary | ICD-10-CM | POA: Diagnosis not present

## 2016-10-03 DIAGNOSIS — N183 Chronic kidney disease, stage 3 (moderate): Secondary | ICD-10-CM | POA: Diagnosis not present

## 2016-10-03 LAB — URINALYSIS, ROUTINE W REFLEX MICROSCOPIC
Bilirubin Urine: NEGATIVE
Glucose, UA: NEGATIVE mg/dL
Hgb urine dipstick: NEGATIVE
KETONES UR: NEGATIVE mg/dL
NITRITE: POSITIVE — AB
Specific Gravity, Urine: 1.015 (ref 1.005–1.030)
pH: 5.5 (ref 5.0–8.0)

## 2016-10-03 LAB — I-STAT CHEM 8, ED
BUN: 42 mg/dL — ABNORMAL HIGH (ref 6–20)
Calcium, Ion: 1.2 mmol/L (ref 1.15–1.40)
Chloride: 105 mmol/L (ref 101–111)
Creatinine, Ser: 2 mg/dL — ABNORMAL HIGH (ref 0.61–1.24)
GLUCOSE: 199 mg/dL — AB (ref 65–99)
HEMATOCRIT: 31 % — AB (ref 39.0–52.0)
HEMOGLOBIN: 10.5 g/dL — AB (ref 13.0–17.0)
Potassium: 3.8 mmol/L (ref 3.5–5.1)
Sodium: 139 mmol/L (ref 135–145)
TCO2: 26 mmol/L (ref 0–100)

## 2016-10-03 LAB — I-STAT TROPONIN, ED: TROPONIN I, POC: 0.16 ng/mL — AB (ref 0.00–0.08)

## 2016-10-03 LAB — COMPREHENSIVE METABOLIC PANEL
ALBUMIN: 3.1 g/dL — AB (ref 3.5–5.0)
ALK PHOS: 128 U/L — AB (ref 38–126)
ALT: 29 U/L (ref 17–63)
AST: 34 U/L (ref 15–41)
Anion gap: 10 (ref 5–15)
BUN: 38 mg/dL — ABNORMAL HIGH (ref 6–20)
CALCIUM: 8.9 mg/dL (ref 8.9–10.3)
CO2: 23 mmol/L (ref 22–32)
Chloride: 104 mmol/L (ref 101–111)
Creatinine, Ser: 1.97 mg/dL — ABNORMAL HIGH (ref 0.61–1.24)
GFR calc non Af Amer: 28 mL/min — ABNORMAL LOW (ref >60)
GFR, EST AFRICAN AMERICAN: 33 mL/min — AB (ref >60)
Glucose, Bld: 204 mg/dL — ABNORMAL HIGH (ref 65–99)
POTASSIUM: 3.8 mmol/L (ref 3.5–5.1)
Sodium: 137 mmol/L (ref 135–145)
TOTAL PROTEIN: 7.7 g/dL (ref 6.5–8.1)
Total Bilirubin: 0.5 mg/dL (ref 0.3–1.2)

## 2016-10-03 LAB — URINALYSIS, MICROSCOPIC (REFLEX)

## 2016-10-03 LAB — ECHOCARDIOGRAM COMPLETE
Height: 69 in
Weight: 2814.83 oz

## 2016-10-03 LAB — GLUCOSE, CAPILLARY
GLUCOSE-CAPILLARY: 155 mg/dL — AB (ref 65–99)
Glucose-Capillary: 130 mg/dL — ABNORMAL HIGH (ref 65–99)
Glucose-Capillary: 224 mg/dL — ABNORMAL HIGH (ref 65–99)
Glucose-Capillary: 147 mg/dL — ABNORMAL HIGH (ref 65–99)

## 2016-10-03 LAB — CBC WITH DIFFERENTIAL/PLATELET
BASOS PCT: 0 %
Basophils Absolute: 0 10*3/uL (ref 0.0–0.1)
Eosinophils Absolute: 0 10*3/uL (ref 0.0–0.7)
Eosinophils Relative: 0 %
HCT: 30.4 % — ABNORMAL LOW (ref 39.0–52.0)
HEMOGLOBIN: 10.5 g/dL — AB (ref 13.0–17.0)
LYMPHS ABS: 1.4 10*3/uL (ref 0.7–4.0)
Lymphocytes Relative: 13 %
MCH: 33.3 pg (ref 26.0–34.0)
MCHC: 34.5 g/dL (ref 30.0–36.0)
MCV: 96.5 fL (ref 78.0–100.0)
MONO ABS: 0.1 10*3/uL (ref 0.1–1.0)
Monocytes Relative: 1 %
NEUTROS PCT: 86 %
Neutro Abs: 9.9 10*3/uL — ABNORMAL HIGH (ref 1.7–7.7)
Platelets: 245 10*3/uL (ref 150–400)
RBC: 3.15 MIL/uL — ABNORMAL LOW (ref 4.22–5.81)
RDW: 13.6 % (ref 11.5–15.5)
WBC: 11.4 10*3/uL — ABNORMAL HIGH (ref 4.0–10.5)

## 2016-10-03 LAB — PROTIME-INR
INR: 2.17
PROTHROMBIN TIME: 24.5 s — AB (ref 11.4–15.2)

## 2016-10-03 LAB — TROPONIN I
TROPONIN I: 0.18 ng/mL — AB (ref <0.03)
TROPONIN I: 0.13 ng/mL — AB (ref <0.03)
Troponin I: 0.21 ng/mL (ref <0.03)

## 2016-10-03 LAB — I-STAT CG4 LACTIC ACID, ED: LACTIC ACID, VENOUS: 1.83 mmol/L (ref 0.5–1.9)

## 2016-10-03 MED ORDER — SENNA 8.6 MG PO TABS
1.0000 | ORAL_TABLET | Freq: Every day | ORAL | Status: DC
  Administered 2016-10-03 – 2016-10-04 (×2): 8.6 mg via ORAL
  Filled 2016-10-03 (×2): qty 1

## 2016-10-03 MED ORDER — AMLODIPINE BESYLATE 5 MG PO TABS
10.0000 mg | ORAL_TABLET | Freq: Every day | ORAL | Status: DC
  Administered 2016-10-03 – 2016-10-04 (×2): 10 mg via ORAL
  Filled 2016-10-03 (×2): qty 2

## 2016-10-03 MED ORDER — ONDANSETRON HCL 4 MG/2ML IJ SOLN: 4.0000 mg | Freq: Four times a day (QID) | INTRAMUSCULAR | Status: DC | PRN

## 2016-10-03 MED ORDER — SODIUM CHLORIDE 0.45 % IV SOLN
INTRAVENOUS | Status: DC
  Administered 2016-10-03: 16:00:00 via INTRAVENOUS

## 2016-10-03 MED ORDER — ACETAMINOPHEN 650 MG RE SUPP: 650.0000 mg | Freq: Four times a day (QID) | RECTAL | Status: DC | PRN

## 2016-10-03 MED ORDER — PHENYTOIN SODIUM EXTENDED 100 MG PO CAPS
300.0000 mg | ORAL_CAPSULE | Freq: Every day | ORAL | Status: DC
  Administered 2016-10-03: 300 mg via ORAL
  Filled 2016-10-03: qty 3

## 2016-10-03 MED ORDER — LORATADINE 10 MG PO TABS
10.0000 mg | ORAL_TABLET | Freq: Every day | ORAL | Status: DC
  Administered 2016-10-04: 10 mg via ORAL
  Filled 2016-10-03: qty 1

## 2016-10-03 MED ORDER — ASPIRIN 81 MG PO CHEW
324.0000 mg | CHEWABLE_TABLET | Freq: Once | ORAL | Status: AC
  Administered 2016-10-03: 324 mg via ORAL
  Filled 2016-10-03: qty 4

## 2016-10-03 MED ORDER — METOPROLOL SUCCINATE ER 25 MG PO TB24
25.0000 mg | ORAL_TABLET | Freq: Every day | ORAL | Status: DC
  Administered 2016-10-03 – 2016-10-04 (×2): 25 mg via ORAL
  Filled 2016-10-03 (×2): qty 1

## 2016-10-03 MED ORDER — PANTOPRAZOLE SODIUM 40 MG PO TBEC
40.0000 mg | DELAYED_RELEASE_TABLET | Freq: Every day | ORAL | Status: DC
  Administered 2016-10-03 – 2016-10-04 (×2): 40 mg via ORAL
  Filled 2016-10-03 (×2): qty 1

## 2016-10-03 MED ORDER — LISINOPRIL 20 MG PO TABS
20.0000 mg | ORAL_TABLET | Freq: Every day | ORAL | Status: DC
  Administered 2016-10-03 – 2016-10-04 (×2): 20 mg via ORAL
  Filled 2016-10-03 (×2): qty 1

## 2016-10-03 MED ORDER — WARFARIN SODIUM 6 MG PO TABS
6.0000 mg | ORAL_TABLET | ORAL | Status: DC
  Administered 2016-10-03 – 2016-10-04 (×2): 6 mg via ORAL
  Filled 2016-10-03 (×2): qty 1

## 2016-10-03 MED ORDER — VITAMIN D3 25 MCG (1000 UNIT) PO TABS
2000.0000 [IU] | ORAL_TABLET | Freq: Every day | ORAL | Status: DC
  Administered 2016-10-03 – 2016-10-04 (×2): 2000 [IU] via ORAL
  Filled 2016-10-03 (×2): qty 2

## 2016-10-03 MED ORDER — ACETAMINOPHEN 500 MG PO TABS
500.0000 mg | ORAL_TABLET | Freq: Two times a day (BID) | ORAL | Status: DC
  Administered 2016-10-03 – 2016-10-04 (×3): 500 mg via ORAL
  Filled 2016-10-03 (×3): qty 1

## 2016-10-03 MED ORDER — POLYVINYL ALCOHOL 1.4 % OP SOLN
1.0000 [drp] | Freq: Three times a day (TID) | OPHTHALMIC | Status: DC | PRN
  Filled 2016-10-03: qty 15

## 2016-10-03 MED ORDER — WARFARIN SODIUM 3 MG PO TABS: 3.0000 mg | ORAL_TABLET | ORAL | Status: DC

## 2016-10-03 MED ORDER — DONEPEZIL HCL 5 MG PO TABS
10.0000 mg | ORAL_TABLET | Freq: Every day | ORAL | Status: DC
  Administered 2016-10-03: 10 mg via ORAL
  Filled 2016-10-03: qty 2

## 2016-10-03 MED ORDER — POLYETHYLENE GLYCOL 3350 17 G PO PACK: 17.0000 g | PACK | Freq: Every day | ORAL | Status: DC | PRN

## 2016-10-03 MED ORDER — LATANOPROST 0.005 % OP SOLN
1.0000 [drp] | Freq: Every day | OPHTHALMIC | Status: DC
  Administered 2016-10-03: 1 [drp] via OPHTHALMIC
  Filled 2016-10-03: qty 2.5

## 2016-10-03 MED ORDER — BENZONATATE 100 MG PO CAPS
200.0000 mg | ORAL_CAPSULE | Freq: Three times a day (TID) | ORAL | Status: DC | PRN
  Administered 2016-10-03 – 2016-10-04 (×2): 200 mg via ORAL
  Filled 2016-10-03 (×2): qty 2

## 2016-10-03 MED ORDER — ENOXAPARIN SODIUM 40 MG/0.4ML ~~LOC~~ SOLN: 40.0000 mg | SUBCUTANEOUS | Status: DC

## 2016-10-03 MED ORDER — SODIUM CHLORIDE 0.9 % IV SOLN
INTRAVENOUS | Status: DC
  Administered 2016-10-03: 06:00:00 via INTRAVENOUS

## 2016-10-03 MED ORDER — INSULIN ASPART 100 UNIT/ML ~~LOC~~ SOLN
0.0000 [IU] | Freq: Three times a day (TID) | SUBCUTANEOUS | Status: DC
  Administered 2016-10-03: 3 [IU] via SUBCUTANEOUS
  Administered 2016-10-03: 2 [IU] via SUBCUTANEOUS
  Administered 2016-10-03 – 2016-10-04 (×2): 1 [IU] via SUBCUTANEOUS
  Administered 2016-10-04: 3 [IU] via SUBCUTANEOUS
  Administered 2016-10-04: 1 [IU] via SUBCUTANEOUS

## 2016-10-03 MED ORDER — ATORVASTATIN CALCIUM 20 MG PO TABS
20.0000 mg | ORAL_TABLET | Freq: Every day | ORAL | Status: DC
  Administered 2016-10-03 – 2016-10-04 (×2): 20 mg via ORAL
  Filled 2016-10-03 (×2): qty 1

## 2016-10-03 MED ORDER — DEXTROSE 5 % IV SOLN
1.0000 g | INTRAVENOUS | Status: DC
  Administered 2016-10-04: 1 g via INTRAVENOUS
  Filled 2016-10-03 (×2): qty 10

## 2016-10-03 MED ORDER — WARFARIN - PHARMACIST DOSING INPATIENT: Freq: Every day | Status: DC

## 2016-10-03 MED ORDER — ONDANSETRON HCL 4 MG PO TABS: 4.0000 mg | ORAL_TABLET | Freq: Four times a day (QID) | ORAL | Status: DC | PRN

## 2016-10-03 MED ORDER — DOCUSATE SODIUM 100 MG PO CAPS
100.0000 mg | ORAL_CAPSULE | Freq: Every day | ORAL | Status: DC
  Administered 2016-10-03 – 2016-10-04 (×2): 100 mg via ORAL
  Filled 2016-10-03 (×2): qty 1

## 2016-10-03 MED ORDER — ACETAMINOPHEN 325 MG PO TABS
650.0000 mg | ORAL_TABLET | Freq: Four times a day (QID) | ORAL | Status: DC | PRN
  Administered 2016-10-04: 650 mg via ORAL
  Filled 2016-10-03: qty 2

## 2016-10-03 MED ORDER — HYDRALAZINE HCL 50 MG PO TABS
75.0000 mg | ORAL_TABLET | Freq: Three times a day (TID) | ORAL | Status: DC
  Administered 2016-10-03 – 2016-10-04 (×5): 75 mg via ORAL
  Filled 2016-10-03 (×5): qty 1

## 2016-10-03 MED ORDER — DEXTROSE 5 % IV SOLN
1.0000 g | Freq: Once | INTRAVENOUS | Status: AC
  Administered 2016-10-03: 1 g via INTRAVENOUS
  Filled 2016-10-03: qty 10

## 2016-10-03 MED ORDER — INSULIN GLARGINE 100 UNIT/ML ~~LOC~~ SOLN
20.0000 [IU] | Freq: Every day | SUBCUTANEOUS | Status: DC
  Administered 2016-10-03 – 2016-10-04 (×2): 20 [IU] via SUBCUTANEOUS
  Filled 2016-10-03 (×2): qty 0.2

## 2016-10-03 MED ORDER — SODIUM CHLORIDE 0.9% FLUSH
3.0000 mL | Freq: Two times a day (BID) | INTRAVENOUS | Status: DC
  Administered 2016-10-03 – 2016-10-04 (×3): 3 mL via INTRAVENOUS

## 2016-10-03 MED ORDER — HYDROXYZINE HCL 10 MG PO TABS
10.0000 mg | ORAL_TABLET | Freq: Two times a day (BID) | ORAL | Status: DC | PRN
  Administered 2016-10-03: 10 mg via ORAL
  Filled 2016-10-03: qty 1

## 2016-10-03 MED ORDER — CALCITRIOL 0.25 MCG PO CAPS
0.2500 ug | ORAL_CAPSULE | Freq: Every day | ORAL | Status: DC
  Administered 2016-10-03 – 2016-10-04 (×2): 0.25 ug via ORAL
  Filled 2016-10-03 (×2): qty 1

## 2016-10-03 NOTE — ED Notes (Signed)
I Stat Troponin: 0.16 RN and Provider notified

## 2016-10-03 NOTE — Progress Notes (Signed)
  Echocardiogram 2D Echocardiogram has been performed.  Lisl Slingerland L Androw 10/03/2016, 12:16 PM

## 2016-10-03 NOTE — ED Notes (Signed)
Date and time results received: 10/03/16 7:06 AM  (use smartphrase ".now" to insert current time)  Test: Troponin Critical Value: 0.18  Name of Provider Notified: [place]  Orders Received? Or Actions Taken?: None

## 2016-10-03 NOTE — H&P (Signed)
History and Physical    Eric [name] WFU:[id number] DOB: 1923-11-26 DOA: 10/02/2016  PCP: [name], MD  Patient coming from: Home  I have personally briefly reviewed patient's old medical records in [place]  Chief Complaint: Fever  HPI: Eric [name] is a 83 y.o. male with medical history significant of TIA, HTN, DM, CKD.  Patient presents to the ED with c/o generalized weakness, this has been increasing gradually over the past week.  "Fever" of 99.8 over the past 1 day improved with use of tylenol at home PTA.  Had episode of near collapse of his legs earlier today.  Per patient his "legs gave out" after returning home from church.  Cough also ongoing for the past week or so he states.   ED Course: UA shows 6-30 WBC, trace leukocyte esterase, positive nitrites and many bacteria.  CXR neg.  Patient given 1L bolus of NS.  Patient empirically started on rocephin for presumed UTI.  Trop of 0.16, patient denies any CP.   Review of Systems: As per HPI otherwise 10 point review of systems negative.   Past Medical History:  Diagnosis Date  . Adult failure to thrive   . Altered mental status   . ANEMIA-NOS 02/17/2007  . Anxiety   . BACK PAIN, LUMBAR   . BENIGN PROSTATIC HYPERTROPHY   . Bilateral leg edema   . CAP (community acquired pneumonia)   . CKD (chronic kidney disease)   . DEPRESSION 12/25/2008  . Diabetes mellitus   . DIABETES MELLITUS, TYPE I, CONTROLLED, WITH RETINOPATHY   . Encephalopathy, metabolic   . Gastroparesis   . GERD 02/17/2007  . Hyperlipidemia   . HYPERTENSION 02/17/2007  . Hyponatremia   . LIVER DISORDER   . OSTEOARTHRITIS 03/04/2010  . Pneumonia   . PSA, INCREASED   . Stroke ([place])   . VITAMIN B12 DEFICIENCY     Past Surgical History:  Procedure Laterality Date  . LEFT HEART CATHETERIZATION WITH CORONARY ANGIOGRAM N/A 04/23/2011   Procedure: LEFT HEART CATHETERIZATION WITH CORONARY ANGIOGRAM;  Surgeon: Peter M Martinique, MD;  Location: [facility]  CATH LAB;  Service: Cardiovascular;  Laterality: N/A;  . TRANSURETHRAL RESECTION OF PROSTATE       reports that he has never smoked. He has never used smokeless tobacco. He reports that he does not drink alcohol or use drugs.  Allergies  Allergen Reactions  . Percocet [Oxycodone-Acetaminophen] Rash    Family History  Problem Relation Age of Onset  . Cancer Father        had uncertain type of cancer  . Cancer Brother        Prostate Cancer  . Cancer Brother        Prostate Cancer  . Diabetes Other      Prior to Admission medications   Medication Sig Start Date End Date Taking? Authorizing Provider  acetaminophen (TYLENOL) 500 MG tablet Take 500 mg by mouth 2 (two) times daily. Reported on 06/19/2015   Yes [provider]  amLODipine (NORVASC) 10 MG tablet Take 10 mg by mouth daily.  05/26/16  Yes [provider]  atorvastatin (LIPITOR) 20 MG tablet Take 1 tablet (20 mg total) by mouth daily at 6 PM. 11/13/14  Yes Charlynne Cousins, MD  calcitRIOL (ROCALTROL) 0.25 MCG capsule Take 0.25 mcg by mouth daily.  12/26/11  Yes [provider]  cholecalciferol (VITAMIN D) 1000 UNITS tablet Take 2,000 Units by mouth daily.    Yes [provider]  cloNIDine (CATAPRES) 0.1 MG tablet Take 0.1 mg by mouth 2 (two) times daily as needed.  05/03/16  Yes [provider]  docusate sodium (COLACE) 100 MG capsule Take 100 mg by mouth daily.   Yes [provider]  donepezil (ARICEPT) 10 MG tablet Take 1 tablet (10 mg total) by mouth at bedtime. 06/22/16  Yes Dennie Bible, NP  furosemide (LASIX) 20 MG tablet Take 1 tablet (20 mg total) by mouth 2 (two) times daily. Patient taking differently: Take 40 mg by mouth daily.  11/15/14  Yes Charlynne Cousins, MD  hydrALAZINE (APRESOLINE) 25 MG tablet Take 75 mg by mouth 3 (three) times daily.  10/22/14  Yes [provider]  hydrOXYzine (ATARAX/VISTARIL) 10 MG tablet TAKE 1 TABLET BY MOUTH TWICE  DAILY AS NEEDED FOR ITCHING 12/10/14  Yes [provider]  Insulin Glargine (LANTUS SOLOSTAR) 100 UNIT/ML Solostar Pen Inject 32 Units into the skin daily.    Yes [provider]  lisinopril (PRINIVIL,ZESTRIL) 40 MG tablet Take 1 tablet (40 mg total) by mouth 2 (two) times daily. Patient taking differently: Take 20 mg by mouth daily.  11/17/14  Yes Charlynne Cousins, MD  loratadine (CLARITIN) 10 MG tablet Take 10 mg by mouth daily with lunch.   Yes [provider]  LUMIGAN 0.01 % SOLN Place 1 drop into both eyes at bedtime. 08/03/16  Yes [provider]  metoprolol succinate (TOPROL-XL) 25 MG 24 hr tablet Take 25 mg by mouth daily. 02/18/14  Yes [provider]  pantoprazole (PROTONIX) 40 MG tablet  05/04/15  Yes [provider]  phenytoin (DILANTIN) 100 MG ER capsule Take 300 mg by mouth at bedtime.   Yes [provider]  Polyethyl Glycol-Propyl Glycol (SYSTANE ULTRA) 0.4-0.3 % SOLN Place 1 drop into both eyes 3 (three) times daily as needed (dry eyes).   Yes [provider]  polyethylene glycol powder (GLYCOLAX/MIRALAX) powder Take 17 g by mouth daily as needed.  05/18/14  Yes [provider]  senna (SENOKOT) 8.6 MG TABS Take 1 tablet by mouth daily.   Yes [provider]  Triamcinolone Acetonide (NASACORT AQ NA) Place 1 spray into both nostrils daily.   Yes [provider]  warfarin (COUMADIN) 2 MG tablet Take 3-6 mg by mouth See admin instructions. Take 6mg  by mouth once daily every day except for Tuesdays and Thursdays. Take 3mg  on Tuesdays and Thursdays 12/11/15  Yes [provider]  [place] test strip  05/01/14   [provider]  B-D UF III MINI PEN NEEDLES 31G X 5 MM MISC  06/20/14   [provider]  OVER THE COUNTER MEDICATION Apply 1 application topically daily. Over the counter cream to prevent rash on buttocks    [provider]    Physical  Exam: Vitals:   10/02/16 2353 10/03/16 0202 10/03/16 0421 10/03/16 0500  BP: (!) 149/67 (!) 146/57 (!) 158/59 (!) 136/56  Pulse: 80 83 87 78  Resp: 15 16 18    Temp:   97.6 F (36.4 C)   TempSrc:   Oral   SpO2: 99% 100% 96% 92%    Constitutional: NAD, calm, comfortable Eyes: PERRL, lids and conjunctivae normal ENMT: Mucous membranes are moist. Posterior pharynx clear of any exudate or lesions.Normal dentition.  Neck: normal, supple, no masses, no thyromegaly Respiratory: clear to auscultation bilaterally, no wheezing, no crackles. Normal respiratory effort. No accessory muscle use.  Cardiovascular: Regular rate and rhythm, no murmurs /  rubs / gallops. No extremity edema. 2+ pedal pulses. No carotid bruits.  Abdomen: no tenderness, no masses palpated. No hepatosplenomegaly. Bowel sounds positive.  Musculoskeletal: no clubbing / cyanosis. No joint deformity upper and lower extremities. Good ROM, no contractures. Normal muscle tone. BLE edema that is baseline per patient and wife due to "blood clots" Skin: no rashes, lesions, ulcers. No induration Neurologic: CN 2-12 grossly intact. Sensation intact, DTR normal. Strength 5/5 in all 4.  Psychiatric: Normal judgment and insight. Alert and oriented x 3. Normal mood.    Labs on Admission: I have personally reviewed following labs and imaging studies  CBC:  Recent Labs Lab 10/03/16 0125 10/03/16 0145  WBC 11.4*  --   NEUTROABS 9.9*  --   HGB 10.5* 10.5*  HCT 30.4* 31.0*  MCV 96.5  --   PLT 245  --    Basic Metabolic Panel:  Recent Labs Lab 10/03/16 0125 10/03/16 0145  NA 137 139  K 3.8 3.8  CL 104 105  CO2 23  --   GLUCOSE 204* 199*  BUN 38* 42*  CREATININE 1.97* 2.00*  CALCIUM 8.9  --    GFR: CrCl cannot be calculated (Unknown ideal weight.). Liver Function Tests:  Recent Labs Lab 10/03/16 0125  AST 34  ALT 29  ALKPHOS 128*  BILITOT 0.5  PROT 7.7  ALBUMIN 3.1*   No results for input(s): LIPASE, AMYLASE in  the last 168 hours. No results for input(s): AMMONIA in the last 168 hours. Coagulation Profile: No results for input(s): INR, PROTIME in the last 168 hours. Cardiac Enzymes: No results for input(s): CKTOTAL, CKMB, CKMBINDEX, TROPONINI in the last 168 hours. BNP (last 3 results) No results for input(s): PROBNP in the last 8760 hours. HbA1C: No results for input(s): HGBA1C in the last 72 hours. CBG:  Recent Labs Lab 10/02/16 2138  GLUCAP 112*   Lipid Profile: No results for input(s): CHOL, HDL, LDLCALC, TRIG, CHOLHDL, LDLDIRECT in the last 72 hours. Thyroid Function Tests: No results for input(s): TSH, T4TOTAL, FREET4, T3FREE, THYROIDAB in the last 72 hours. Anemia Panel: No results for input(s): VITAMINB12, FOLATE, FERRITIN, TIBC, IRON, RETICCTPCT in the last 72 hours. Urine analysis:    Component Value Date/Time   COLORURINE YELLOW 10/03/2016 0028   APPEARANCEUR CLEAR 10/03/2016 0028   LABSPEC 1.015 10/03/2016 0028   PHURINE 5.5 10/03/2016 0028   GLUCOSEU NEGATIVE 10/03/2016 0028   GLUCOSEU 500 03/30/2011 1055   HGBUR NEGATIVE 10/03/2016 0028   HGBUR moderate 10/03/2008 1349   BILIRUBINUR NEGATIVE 10/03/2016 0028   KETONESUR NEGATIVE 10/03/2016 0028   PROTEINUR TRACE (A) 10/03/2016 0028   UROBILINOGEN 0.2 11/09/2014 1717   NITRITE POSITIVE (A) 10/03/2016 0028   LEUKOCYTESUR TRACE (A) 10/03/2016 0028    Radiological Exams on Admission: Dg Chest 2 View  Result Date: 10/02/2016 CLINICAL DATA:  Acute onset of fever and hypoglycemia. Initial encounter. EXAM: CHEST  2 VIEW COMPARISON:  Chest radiograph performed 11/09/2014 FINDINGS: The lungs are well-aerated and clear. There is no evidence of focal opacification, pleural effusion or pneumothorax. The heart is borderline normal in size. No acute osseous abnormalities are seen. IMPRESSION: No acute cardiopulmonary process seen. Electronically Signed   By: Garald Balding M.D.   On: 10/02/2016 23:28    EKG: Independently  reviewed.  Assessment/Plan Principal Problem:   UTI (urinary tract infection) Active Problems:   Essential hypertension   CKD (chronic kidney disease) stage 3, GFR 30-59 ml/min   DM2 (diabetes mellitus, type 2) ([place])  1. UTI - patient with generalized weakness, at this point will treat as UTI in absence of other obvious cause 1. Rocephin 2. Cultures pending 3. IVF: got 1L bolus in ED 2. HTN - continue home BP meds, but will hold lasix for now to "gently hydrate" the patient 3. CKD stage 3 - unknown baseline, but suspect that his current creat of 2.0 may be his baseline given that it was ~1.6 back in 2016 it appears 4. DM2 - 1. Reduce lantus to 20 units daily 2. Sensitive SSI AC 5. Elevated troponin - 1. Unclear significance, patient having no chest pain. 2. Serial trops ordered 3. Tele monitor  DVT prophylaxis: Continue chronic coumadin (I assume he is on this for the "old blood clots" in his legs) Code Status: Full Family Communication: No family in room Disposition Plan: Home after admit Consults called: None Admission status: Place in obs   Ely Spragg, [facility] Pager [phone]  If 7AM-7PM, please contact day team taking care of patient [email or web address] Password [email or web address]  10/03/2016, 6:02 AM

## 2016-10-03 NOTE — Care Management Obs Status (Addendum)
[place] NOTIFICATION   Patient Details  Name: Eric [name] MRN: [id number] Date of Birth: November 16, 1923   Medicare Observation Status Notification Given:  Yes Contacted sister, Kirstie Mirza # [phone], explained notice. Left copy for sister in room.    Erenest Rasher, RN 10/03/2016, 4:38 PM

## 2016-10-03 NOTE — Progress Notes (Signed)
[facility] for Warfarin Indication: hx of DVTs  Allergies  Allergen Reactions  . Percocet [Oxycodone-Acetaminophen] Rash     Height: 5\' 9"  (175.3 cm) Weight: 175 lb 14.8 oz (79.8 kg) IBW/kg (Calculated) : 70.7  Vital Signs: Temp: 98 F (36.7 C) (06/17 0706) Temp Source: Oral (06/17 0706) BP: 139/50 (06/17 0706) Pulse Rate: 81 (06/17 0706)  Labs:  Recent Labs  10/03/16 0125 10/03/16 0145 10/03/16 0614  HGB 10.5* 10.5*  --   HCT 30.4* 31.0*  --   PLT 245  --   --   LABPROT  --   --  24.5*  INR  --   --  2.17  CREATININE 1.97* 2.00*  --   TROPONINI  --   --  0.18*    Estimated Creatinine Clearance: 24.5 mL/min (A) (by C-G formula based on SCr of 2 mg/dL (H)).  Assessment: 44 yoM c/o fever on chronic warfarin for hx of DVTs.  HD 6 mg daily except 3 mg on Tues/Thur.  LD 6/16  INR therapeutic at 2.17, Hg 10.5, pltc WNL.   Goal of Therapy:  INR 2-3   Plan:  Daily INR Continue home dose 6 mg qday x 3 mg Tu/Thurs  Eudelia Bunch, Pharm.D. [phone] 10/03/2016 9:17 AM

## 2016-10-03 NOTE — Progress Notes (Signed)
[facility]  Patient admitted after midnight see H&P for further details  Patient is a 83 year old male with past medical history TIA, hypertension, diabetes., CAD who presented to the emergency department complaining of weakness. He was admitted for suspected UTI and also was found to have mild elevated troponins.  Patient report no complaints today, if back to baseline. EKG has no significant changes and is in normal sinus rhythm.  Impression UTI Hypertension Chronic kidney disease stage III Diabetes mellitus Elevated troponin  Plan Continue antibiotics BP stable  Patient getting gentle hydration for possible acute kidney injury Elevated troponin in setting of CKD, no significant EKG changes Will obtain echocardiogram and monitor troponins.  Chipper Oman, MD

## 2016-10-03 NOTE — Progress Notes (Signed)
CRITICAL VALUE ALERT  Critical Value:trop=0.21  Date & Time Notied:  10/03/16 1345  Provider Notified:Dr. Quincy Simmonds  Orders Received/Actions taken: see order

## 2016-10-03 NOTE — Care Management Note (Signed)
Case Management Note  Patient Details  Name: Eric [name] MRN: [id number] Date of Birth: March 15, 1924  Subjective/Objective:    UTI, HTN, CKD                Action/Plan: Discharge Planning:  NCM spoke to pt and states he lives at home alone, his sister Eric [name], [place] checks on him daily. Requested NCM contact sister. States he went to SNF rehab in the past but prefers to go back home. NCM contacted sister, Eric [name] via phone. Offered choice for [facility]. States pt had HH in the past not sure but was arranged while he was at [facility]. Per records, Pt had [facility] for [facility]. Contacted [facility] rep with new referral. Waiting PT/OT recommendations for home. Will need HH orders with F2F.  Pt has RW, cane and RW with seat. Has an aide that comes 2 hours each day to assist with getting dressed and fixing breakfast.    PCP [name] MD    Expected Discharge Date:                Expected Discharge Plan:  [place]  In-House Referral:  NA  Discharge planning Services  CM Consult  Post Acute Care Choice:  Home Health Choice offered to:  Sibling, Patient  DME Arranged:  N/A DME Agency:  NA  HH Arranged:  PT, OT HH Agency:  [place]  Status of Service:  In process, will continue to follow  If discussed at Long Length of Stay Meetings, dates discussed:    Additional Comments:  Erenest Rasher, RN 10/03/2016, 4:48 PM

## 2016-10-03 NOTE — Progress Notes (Signed)
ANTICOAGULATION CONSULT NOTE - Initial Consult  Pharmacy Consult for Warfarin Indication: hx of DVTs  Allergies  Allergen Reactions  . Percocet [Oxycodone-Acetaminophen] Rash        Vital Signs: Temp: 97.6 F (36.4 C) (06/17 0421) Temp Source: Oral (06/17 0421) BP: 136/56 (06/17 0500) Pulse Rate: 78 (06/17 0500)  Labs:  Recent Labs  10/03/16 0125 10/03/16 0145  HGB 10.5* 10.5*  HCT 30.4* 31.0*  PLT 245  --   CREATININE 1.97* 2.00*    CrCl cannot be calculated (Unknown ideal weight.).   Medical History: Past Medical History:  Diagnosis Date  . Adult failure to thrive   . Altered mental status   . ANEMIA-NOS 02/17/2007  . Anxiety   . BACK PAIN, LUMBAR   . BENIGN PROSTATIC HYPERTROPHY   . Bilateral leg edema   . CAP (community acquired pneumonia)   . CKD (chronic kidney disease)   . DEPRESSION 12/25/2008  . Diabetes mellitus   . DIABETES MELLITUS, TYPE I, CONTROLLED, WITH RETINOPATHY   . Encephalopathy, metabolic   . Gastroparesis   . GERD 02/17/2007  . Hyperlipidemia   . HYPERTENSION 02/17/2007  . Hyponatremia   . LIVER DISORDER   . OSTEOARTHRITIS 03/04/2010  . Pneumonia   . PSA, INCREASED   . Stroke ([place])   . VITAMIN B12 DEFICIENCY     Medications:  Scheduled:  . acetaminophen  500 mg Oral BID  . amLODipine  10 mg Oral Daily  . atorvastatin  20 mg Oral q1800  . calcitRIOL  0.25 mcg Oral Daily  . cholecalciferol  2,000 Units Oral Daily  . docusate sodium  100 mg Oral Daily  . donepezil  10 mg Oral QHS  . hydrALAZINE  75 mg Oral TID  . insulin aspart  0-9 Units Subcutaneous TID WC  . insulin glargine  20 Units Subcutaneous Daily  . latanoprost  1 drop Both Eyes QHS  . lisinopril  20 mg Oral Daily  . loratadine  10 mg Oral Q lunch  . metoprolol succinate  25 mg Oral Daily  . pantoprazole  40 mg Oral Daily  . phenytoin  300 mg Oral QHS  . senna  1 tablet Oral Daily  . sodium chloride flush  3 mL Intravenous Q12H   Infusions:  . sodium  chloride    . [START ON 10/04/2016] cefTRIAXone (ROCEPHIN)  IV      Assessment: 38 yoM c/o fever on chronic warfarin for hx of DVTs.  HD 6 mg daily except 3 mg on Tues/Thur.  LD 6/16  INR pending Goal of Therapy:  INR 2-3    Plan:  Daily INR pending Will order dose when INR results if needed   Dorrene German 10/03/2016,6:14 AM

## 2016-10-04 ENCOUNTER — Encounter (HOSPITAL_COMMUNITY): Payer: Self-pay | Admitting: *Deleted

## 2016-10-04 DIAGNOSIS — I10 Essential (primary) hypertension: Secondary | ICD-10-CM

## 2016-10-04 DIAGNOSIS — N183 Chronic kidney disease, stage 3 (moderate): Secondary | ICD-10-CM | POA: Diagnosis not present

## 2016-10-04 DIAGNOSIS — N3001 Acute cystitis with hematuria: Secondary | ICD-10-CM

## 2016-10-04 DIAGNOSIS — E1122 Type 2 diabetes mellitus with diabetic chronic kidney disease: Secondary | ICD-10-CM | POA: Diagnosis not present

## 2016-10-04 DIAGNOSIS — R748 Abnormal levels of other serum enzymes: Secondary | ICD-10-CM

## 2016-10-04 DIAGNOSIS — Z794 Long term (current) use of insulin: Secondary | ICD-10-CM

## 2016-10-04 DIAGNOSIS — N3000 Acute cystitis without hematuria: Secondary | ICD-10-CM | POA: Diagnosis not present

## 2016-10-04 LAB — BASIC METABOLIC PANEL
Anion gap: 7 (ref 5–15)
BUN: 37 mg/dL — AB (ref 6–20)
CO2: 22 mmol/L (ref 22–32)
CREATININE: 1.93 mg/dL — AB (ref 0.61–1.24)
Calcium: 8.1 mg/dL — ABNORMAL LOW (ref 8.9–10.3)
Chloride: 109 mmol/L (ref 101–111)
GFR calc Af Amer: 34 mL/min — ABNORMAL LOW (ref >60)
GFR, EST NON AFRICAN AMERICAN: 29 mL/min — AB (ref >60)
Glucose, Bld: 145 mg/dL — ABNORMAL HIGH (ref 65–99)
Potassium: 3.6 mmol/L (ref 3.5–5.1)
SODIUM: 138 mmol/L (ref 135–145)

## 2016-10-04 LAB — CBC WITH DIFFERENTIAL/PLATELET
Basophils Absolute: 0 10*3/uL (ref 0.0–0.1)
Basophils Relative: 0 %
EOS ABS: 0.1 10*3/uL (ref 0.0–0.7)
EOS PCT: 2 %
HCT: 28 % — ABNORMAL LOW (ref 39.0–52.0)
Hemoglobin: 9.7 g/dL — ABNORMAL LOW (ref 13.0–17.0)
LYMPHS ABS: 1.4 10*3/uL (ref 0.7–4.0)
Lymphocytes Relative: 18 %
MCH: 33.4 pg (ref 26.0–34.0)
MCHC: 34.6 g/dL (ref 30.0–36.0)
MCV: 96.6 fL (ref 78.0–100.0)
MONOS PCT: 1 %
Monocytes Absolute: 0 10*3/uL — ABNORMAL LOW (ref 0.1–1.0)
Neutro Abs: 6.1 10*3/uL (ref 1.7–7.7)
Neutrophils Relative %: 79 %
PLATELETS: 221 10*3/uL (ref 150–400)
RBC: 2.9 MIL/uL — ABNORMAL LOW (ref 4.22–5.81)
RDW: 13.6 % (ref 11.5–15.5)
WBC: 7.6 10*3/uL (ref 4.0–10.5)

## 2016-10-04 LAB — PROTIME-INR
INR: 2
Prothrombin Time: 23 seconds — ABNORMAL HIGH (ref 11.4–15.2)

## 2016-10-04 LAB — GLUCOSE, CAPILLARY
Glucose-Capillary: 124 mg/dL — ABNORMAL HIGH (ref 65–99)
Glucose-Capillary: 240 mg/dL — ABNORMAL HIGH (ref 65–99)
Glucose-Capillary: 148 mg/dL — ABNORMAL HIGH (ref 65–99)

## 2016-10-04 MED ORDER — FLUTICASONE PROPIONATE 50 MCG/ACT NA SUSP: 1.0000 | Freq: Two times a day (BID) | NASAL | 2 refills | Status: AC

## 2016-10-04 MED ORDER — FLUTICASONE PROPIONATE 50 MCG/ACT NA SUSP
1.0000 | Freq: Two times a day (BID) | NASAL | Status: DC
  Administered 2016-10-04: 1 via NASAL
  Filled 2016-10-04: qty 16

## 2016-10-04 NOTE — Clinical Social Work Note (Signed)
Clinical Social Work Assessment  Patient Details  Name: Eric [name] MRN: [id number] Date of Birth: Sep 25, 1923  Date of referral:  10/04/16               Reason for consult:  Facility Placement                Permission sought to share information with:  Chartered certified accountant granted to share information::  Yes, Verbal Permission Granted  Name::        Agency::     Relationship::     Contact Information:     Housing/Transportation Living arrangements for the past 2 months:  Single Family Home Source of Information:  Patient Patient Interpreter Needed:  None Criminal Activity/Legal Involvement Pertinent to Current Situation/Hospitalization:  No - Comment as needed Significant Relationships:  Siblings Lives with:  Self Do you feel safe going back to the place where you live?   (PT recommended SNF) Need for family participation in patient care:  Yes (Comment)  Care giving concerns:  Patient resides alone and experiencing weakness. Patient having issues ambulating. PT recommended SNF.    Social Worker assessment / plan:  CSW notified by [facility] that patient/patient's family is interested in SNF. CSW spoke with patient/patient's sister at bedside. Patient reported that he is agreeable to SNF. Patient's sister reported that she has concerns about patient returning home alone because he only has 2 hours of care each day. Patient's sister reported that she would like patient to go to SNF for ST rehab prior to returning home. Patient/patient's sister requested that patient be placed at [facility]. CSW will complete FL2 and assist with discharge planning to SNF.  Employment status:  Retired Nurse, adult PT Recommendations:  [place] / Referral to community resources:  [place]  Patient/Family's Response to care:  Patient/patient's sister agreeable to SNF for short term  rehab.  Patient/Family's Understanding of and Emotional Response to Diagnosis, Current Treatment, and Prognosis:  Patient/Patient's sister verbalized understanding of patient's current treatment and need for SNF prior to returning home. Patient presented calm and was cooperative with discharge planning to SNF.  Emotional Assessment Appearance:  Appears younger than stated age Attitude/Demeanor/Rapport:  Other (Cooperative) Affect (typically observed):  Calm Orientation:  Oriented to Self, Oriented to Place, Oriented to Situation Alcohol / Substance use:  Not Applicable Psych involvement (Current and /or in the community):  No (Comment)  Discharge Needs  Concerns to be addressed:  No discharge needs identified Readmission within the last 30 days:  No Current discharge risk:  None Barriers to Discharge:  No Barriers Identified   Burnis Medin, LCSW 10/04/2016, 4:49 PM

## 2016-10-04 NOTE — Evaluation (Signed)
Physical Therapy Evaluation Patient Details Name: Eric [name] MRN: [id number] DOB: 1923-04-22 Today's Date: 10/04/2016   History of Present Illness  Pt is a 83 year old male with past medical history TIA, hypertension, diabetes., CAD who presented to the ED complaining of weakness and episode of near collapse of his legs earlier today. He was admitted for  UTI and  mildly elevated troponins.  Clinical Impression  Pt admitted with above diagnosis. Pt currently with functional limitations due to the deficits listed below (see PT Problem List).  Pt will benefit from skilled PT to increase their independence and safety with mobility to allow discharge to the venue listed below.  \  Pt requiring mod assist for bed mobility, transfers and min to mod assist with gait--pt is a fall risk and would recommend 24hr assist if pt D/Cs home as he is significantly weaker than his baseline, at this time he only has 2hrs assist per day from his [place]; Currently recommend SNF, see eval below for further mobility details; will continue to follow in acute setting    Follow Up Recommendations SNF;Supervision/Assistance - 24 hour    Equipment Recommendations  None recommended by PT    Recommendations for Other Services       Precautions / Restrictions Precautions Precautions: Fall Restrictions Weight Bearing Restrictions: No      Mobility  Bed Mobility Overal bed mobility: Needs Assistance Bed Mobility: Sit to Supine      Sit to supine: Mod assist   General bed mobility comments: assist to lift LEs onto bed, assist to position self in bed, great difficulty scooting laterally and bridging to adjust bed pad and gown  Transfers Overall transfer level: Needs assistance Equipment used: Rolling walker (2 wheeled) Transfers: Sit to/from Stand Sit to Stand: Mod assist Stand pivot transfers: Mod assist       General transfer comment: assist to rise and transition to RW,  multi-modal cues for  hand placement  Ambulation/Gait Ambulation/Gait assistance: Min assist Ambulation Distance (Feet): 50 Feet Assistive device: Rolling walker (2 wheeled) Gait Pattern/deviations: Step-through pattern;Decreased stride length;Trunk flexed;Shuffle     General Gait Details: multi-modal cues for trunk extension, step length; assist for balance during turns and to maneuver RW; HR up to 123  Stairs            Wheelchair Mobility    Modified Rankin (Stroke Patients Only)       Balance Overall balance assessment: Needs assistance Sitting-balance support: Feet supported;No upper extremity supported Sitting balance-Leahy Scale: Fair     Standing balance support: Bilateral upper extremity supported Standing balance-Leahy Scale: Poor Standing balance comment: requires UE support                              Pertinent Vitals/Pain Pain Assessment: No/denies pain Faces Pain Scale: No hurt    Home Living Family/patient expects to be discharged to:: Private residence Living Arrangements: Alone Available Help at Discharge: Personal care attendant;Available PRN/intermittently (has [place] aide 2hrs per day) Type of Home: House       Home Layout: One level Home Equipment: [place] - 4 wheels;Shower seat;Walker - 2 wheels;Cane - single point      Prior Function Level of Independence: Needs assistance;Independent with assistive device(s)   Gait / Transfers Assistance Needed: amb with wheeled walker  ADL's / Homemaking Assistance Needed: Pt reports he has an aide who assists with dressing and bathing ADLs as well as IADLs including  medication management and grocery shopping        Hand Dominance        Extremity/Trunk Assessment   Upper Extremity Assessment Upper Extremity Assessment: Defer to OT evaluation;Generalized weakness    Lower Extremity Assessment Lower Extremity Assessment: Generalized weakness       Communication   Communication: No difficulties   Cognition Arousal/Alertness: Awake/alert Behavior During Therapy: WFL for tasks assessed/performed Overall Cognitive Status: No family/caregiver present to determine baseline cognitive functioning                                 General Comments: appropriate during PT eval, oriented to place and self (not oriented to date during OT eval)      General Comments      Exercises     Assessment/Plan    PT Assessment Patient needs continued PT services  PT Problem List Decreased strength;Decreased activity tolerance;Decreased balance;Decreased mobility;Decreased knowledge of use of DME       PT Treatment Interventions DME instruction;Gait training;Therapeutic activities;Therapeutic exercise;Patient/family education;Functional mobility training    PT Goals (Current goals can be found in the Care Plan section)  Acute Rehab PT Goals Patient Stated Goal: to regain strength/independence PT Goal Formulation: With patient Time For Goal Achievement: 10/11/16 Potential to Achieve Goals: Good    Frequency Min 3X/week   Barriers to discharge        Co-evaluation               AM-PAC PT "6 Clicks" Daily Activity  Outcome Measure Difficulty turning over in bed (including adjusting bedclothes, sheets and blankets)?: Total Difficulty moving from lying on back to sitting on the side of the bed? : Total Difficulty sitting down on and standing up from a chair with arms (e.g., wheelchair, bedside commode, etc,.)?: Total Help needed moving to and from a bed to chair (including a wheelchair)?: A Lot Help needed walking in hospital room?: A Lot Help needed climbing 3-5 steps with a railing? : A Lot 6 Click Score: 9    End of Session Equipment Utilized During Treatment: Gait belt Activity Tolerance: Patient tolerated treatment well;Patient limited by fatigue Patient left: in bed;with call bell/phone within reach;with bed alarm set   PT Visit Diagnosis: Unsteadiness on  feet (R26.81);Muscle weakness (generalized) (M62.81)    Time: 3664-4034 PT Time Calculation (min) (ACUTE ONLY): 18 min   Charges:   PT Evaluation $PT Eval Low Complexity: 1 Procedure     PT G Codes:   PT G-Codes **NOT FOR INPATIENT CLASS** Functional Assessment Tool Used: AM-PAC 6 Clicks Basic Mobility;Clinical judgement Functional Limitation: Mobility: Walking and moving around Mobility: Walking and Moving Around Current Status (V4259): At least 40 percent but less than 60 percent impaired, limited or restricted Mobility: Walking and Moving Around Goal Status [phone]): At least 1 percent but less than 20 percent impaired, limited or restricted    Kenyon Ana, PT Pager: [phone] 10/04/2016   [facility] 10/04/2016, 2:30 PM

## 2016-10-04 NOTE — Progress Notes (Signed)
ABN explained and given to pt and POA at bedside.

## 2016-10-04 NOTE — Progress Notes (Signed)
This CM and CSW spoke with pt and sister POA Ms. Hill at bedside concerning discharge plan. Pt and sister state that he(pt) can not walk into his(pt's home) and will need more PT. Pt agreed with going to SNF for Rehab PT. CSW to follow, CM will sign off. Pt and sister selected [facility].

## 2016-10-04 NOTE — Progress Notes (Signed)
[facility] for Warfarin Indication: hx of DVTs  Allergies  Allergen Reactions  . Percocet [Oxycodone-Acetaminophen] Rash     Height: 5\' 9"  (175.3 cm) Weight: 175 lb 14.8 oz (79.8 kg) IBW/kg (Calculated) : 70.7  Vital Signs: Temp: 98.6 F (37 C) (06/18 0437) Temp Source: Oral (06/18 0437) BP: 151/64 (06/18 0437) Pulse Rate: 73 (06/18 0437)  Labs:  Recent Labs  10/03/16 0125 10/03/16 0145 10/03/16 0614 10/03/16 1222 10/03/16 1752 10/04/16 0450  HGB 10.5* 10.5*  --   --   --  9.7*  HCT 30.4* 31.0*  --   --   --  28.0*  PLT 245  --   --   --   --  221  LABPROT  --   --  24.5*  --   --  23.0*  INR  --   --  2.17  --   --  2.00  CREATININE 1.97* 2.00*  --   --   --  1.93*  TROPONINI  --   --  0.18* 0.21* 0.13*  --     Estimated Creatinine Clearance: 25.4 mL/min (A) (by C-G formula based on SCr of 1.93 mg/dL (H)).  Assessment: 46 yoM c/o fever on chronic warfarin for hx of DVTs.  HD 6 mg daily except 3 mg on Tues/Thur.  LD 6/16  INR therapeutic at 2.17, Hg 10.5, pltc WNL.   Goal of Therapy:  INR 2-3   Plan:  Daily INR Continue home dose 6 mg qday x 3 mg Tu/Thurs  Dolly Rias RPh 10/04/2016, 10:44 AM Pager [phone]

## 2016-10-04 NOTE — Discharge Summary (Signed)
Physician Discharge Summary  Shawn Carattini Poster  QMG:[id number]  DOB: 01-Aug-1923  DOA: 10/02/2016 PCP: [name], MD  Admit date: 10/02/2016 Discharge date: 10/04/2016  Admitted From: Home  Disposition:  SNF   Recommendations for Outpatient Follow-up:  1. Follow up with PCP in 1 week 2. Please obtain BMP/CBC in one week to monitor for hemoglobin and kidney function 3. Please follow up on the following pending results: urine cultures final results  Discharge Condition: Stable  CODE STATUS: Full code  Diet recommendation: Heart Healthy / Carb Modified   Brief/Interim Summary: Patient is a 83 year old male with past medical history of TIA, hypertension, diabetes, CAD who presented to the emergency department complaining of weakness he was admitted with suspected UTI also was found to have mildly elevated troponin without any chest pain or shortness of breath. Patient was started on IV antibiotics (Rocephin) and underwent serial troponins and echocardiogram. Workup did not reveal any suspicious for ischemic changes as EKG did not have any significant changes echocardiogram only showed grade 1 diastolic dysfunction EF of 65-70% and no wall motion abnormalities. Flat troponin came to be secondary to CKD. Patient was evaluated by physical therapy who recommended SNF for physical rehabilitation. Patient in agreement for short-term rehabilitation.   Subjective: Patient seen and examined on the day of discharge. He reported feeling well and weakness has improved. Denies chest pain, shortness of breath, palpitations. No dysuria or hematuria. Patient has been afebrile and tolerating diet well.  Discharge Diagnoses/Hospital Course:  UTI - causing some weakness  Initially treated with Rocephin will discharge on Ceftin to complete 7 days. Urine cultures grew multiple organisms, was re-incubated for better grow - follow up final results   Hypertension BP stable during hospital stay No changes in  medications were made, resume Norvasc, metoprolol, clonidine, lisinopril and hydralazine Monitor blood pressure as an outpatient. Follow-up with PCP  Chronic kidney disease stage III  Last creatinine recorded in medical record was in 2016, which was around 1.6. On admission creatinine was 1.9 likely new baseline, patient was gently hydrated and creatinine remained stable. Follow-up with nephrologist as an outpatient. Repeat BMP in 1 week Avoid nephrotoxins  Chronic venous insufficiency/Hx DVT  TED hose placed Resume Lasix Leg elevation Patient on warfarin   Diabetes mellitus CBGs were stable No changes in medication were made Continue Lantus Monitor CBGs goal less than 140 fasting  Elevated troponin Flat troponin, EKG no ischemic changes, echocardiogram shows EF of 32-67%, grade 1 diastolic dysfunction and no wall motion abnormalities. Patient is asymptomatic. We will not pursue further cardiac workup at this time, is troponin is elevated due to chronic kidney disease. Patient also will not be a good candidate for cardiac cath.   Post nasal drip  Add Flonase   All other chronic medical condition were stable during the hospitalization.  Patient was seen by physical therapy, recommending SNF for short-term rehabilitation On the day of the discharge the patient's vitals were stable, and no other acute medical condition were reported by patient. Patient was felt safe to be discharge to to SNF  Discharge Instructions  You were cared for by a hospitalist during your hospital stay. If you have any questions about your discharge medications or the care you received while you were in the hospital after you are discharged, you can call the unit and asked to speak with the hospitalist on call if the hospitalist that took care of you is not available. Once you are discharged, your primary care physician will  handle any further medical issues. Please note that NO REFILLS for any discharge  medications will be authorized once you are discharged, as it is imperative that you return to your primary care physician (or establish a relationship with a primary care physician if you do not have one) for your aftercare needs so that they can reassess your need for medications and monitor your lab values.  Discharge Instructions    Call MD for:  difficulty breathing, headache or visual disturbances    Complete by:  As directed    Call MD for:  extreme fatigue    Complete by:  As directed    Call MD for:  hives    Complete by:  As directed    Call MD for:  persistant dizziness or light-headedness    Complete by:  As directed    Call MD for:  persistant nausea and vomiting    Complete by:  As directed    Call MD for:  redness, tenderness, or signs of infection (pain, swelling, redness, odor or green/yellow discharge around incision site)    Complete by:  As directed    Call MD for:  severe uncontrolled pain    Complete by:  As directed    Call MD for:  temperature >100.4    Complete by:  As directed    Diet - low sodium heart healthy    Complete by:  As directed    Increase activity slowly    Complete by:  As directed      Allergies as of 10/04/2016      Reactions   Percocet [oxycodone-acetaminophen] Rash      Medication List    TAKE these medications   ACCU-CHEK AVIVA PLUS test strip Generic drug:  glucose blood   acetaminophen 500 MG tablet Commonly known as:  TYLENOL Take 500 mg by mouth 2 (two) times daily. Reported on 06/19/2015   amLODipine 10 MG tablet Commonly known as:  NORVASC Take 10 mg by mouth daily.   atorvastatin 20 MG tablet Commonly known as:  LIPITOR Take 1 tablet (20 mg total) by mouth daily at 6 PM.   B-D UF III MINI PEN NEEDLES 31G X 5 MM Misc Generic drug:  Insulin Pen Needle   calcitRIOL 0.25 MCG capsule Commonly known as:  ROCALTROL Take 0.25 mcg by mouth daily.   cholecalciferol 1000 units tablet Commonly known as:  VITAMIN D Take  2,000 Units by mouth daily.   cloNIDine 0.1 MG tablet Commonly known as:  CATAPRES Take 0.1 mg by mouth 2 (two) times daily as needed.   docusate sodium 100 MG capsule Commonly known as:  COLACE Take 100 mg by mouth daily.   donepezil 10 MG tablet Commonly known as:  ARICEPT Take 1 tablet (10 mg total) by mouth at bedtime.   fluticasone 50 MCG/ACT nasal spray Commonly known as:  FLONASE Place 1 spray into both nostrils 2 (two) times daily.   furosemide 20 MG tablet Commonly known as:  LASIX Take 1 tablet (20 mg total) by mouth 2 (two) times daily. What changed:  how much to take  when to take this   hydrALAZINE 25 MG tablet Commonly known as:  APRESOLINE Take 75 mg by mouth 3 (three) times daily.   hydrOXYzine 10 MG tablet Commonly known as:  ATARAX/VISTARIL TAKE 1 TABLET BY MOUTH TWICE DAILY AS NEEDED FOR ITCHING   LANTUS SOLOSTAR 100 UNIT/ML Solostar Pen Generic drug:  Insulin Glargine Inject 32 Units into the  skin daily.   lisinopril 40 MG tablet Commonly known as:  PRINIVIL,ZESTRIL Take 1 tablet (40 mg total) by mouth 2 (two) times daily. What changed:  how much to take  when to take this   loratadine 10 MG tablet Commonly known as:  CLARITIN Take 10 mg by mouth daily with lunch.   LUMIGAN 0.01 % Soln Generic drug:  bimatoprost Place 1 drop into both eyes at bedtime.   metoprolol succinate 25 MG 24 hr tablet Commonly known as:  TOPROL-XL Take 25 mg by mouth daily.   NASACORT AQ NA Place 1 spray into both nostrils daily.   OVER THE COUNTER MEDICATION Apply 1 application topically daily. Over the counter cream to prevent rash on buttocks   pantoprazole 40 MG tablet Commonly known as:  PROTONIX   phenytoin 100 MG ER capsule Commonly known as:  DILANTIN Take 300 mg by mouth at bedtime.   polyethylene glycol powder powder Commonly known as:  GLYCOLAX/MIRALAX Take 17 g by mouth daily as needed.   senna 8.6 MG Tabs tablet Commonly known as:   SENOKOT Take 1 tablet by mouth daily.   SYSTANE ULTRA 0.4-0.3 % Soln Generic drug:  Polyethyl Glycol-Propyl Glycol Place 1 drop into both eyes 3 (three) times daily as needed (dry eyes).   warfarin 2 MG tablet Commonly known as:  COUMADIN Take 3-6 mg by mouth See admin instructions. Take 6mg  by mouth once daily every day except for Tuesdays and Thursdays. Take 3mg  on Tuesdays and Thursdays      Follow-up Information    [name], MD. Schedule an appointment as soon as possible for a visit in 1 week(s).   Specialty:  Internal Medicine Why:  Hospital follow up  Contact information: [address] [place] [address] [phone]          Allergies  Allergen Reactions  . Percocet [Oxycodone-Acetaminophen] Rash    Consultations:  None    Procedures/Studies: Dg Chest 2 View  Result Date: 10/02/2016 CLINICAL DATA:  Acute onset of fever and hypoglycemia. Initial encounter. EXAM: CHEST  2 VIEW COMPARISON:  Chest radiograph performed 11/09/2014 FINDINGS: The lungs are well-aerated and clear. There is no evidence of focal opacification, pleural effusion or pneumothorax. The heart is borderline normal in size. No acute osseous abnormalities are seen. IMPRESSION: No acute cardiopulmonary process seen. Electronically Signed   By: Garald Balding M.D.   On: 10/02/2016 23:28   ECHO 10/03/16 ------------------------------------------------------------------- Study Conclusions  - Left ventricle: The cavity size was normal. Wall thickness was   increased in a pattern of moderate LVH. Systolic function was   vigorous. The estimated ejection fraction was in the range of 65%   to 70%. Wall motion was normal; there were no regional wall   motion abnormalities. Doppler parameters are consistent with   abnormal left ventricular relaxation (grade 1 diastolic   dysfunction). The E/e&' ratio is >15, suggesting elevated LV   filling pressure. - Mitral valve: Mild sclerosis - trivial  MR. - Left atrium: The atrium was normal in size. - Tricuspid valve: There was moderate regurgitation. - Pulmonary arteries: PA peak pressure: 60 mm Hg (S). - Inferior vena cava: The vessel was normal in size. The   respirophasic diameter changes were in the normal range (>= 50%),   consistent with normal central venous pressure.  Impressions:  - Compared to a prior study in 2016, the LVEF is higher at 65-70%.   Discharge Exam: Vitals:   10/04/16 0437 10/04/16 1300  BP: (!) 151/64 (!) 150/63  Pulse: 73 75  Resp: 20 18  Temp: 98.6 F (37 C) 98.8 F (37.1 C)   Vitals:   10/03/16 2142 10/04/16 0432 10/04/16 0437 10/04/16 1300  BP: (!) 150/58 (!) 152/57 (!) 151/64 (!) 150/63  Pulse: 82 75 73 75  Resp: 18 18 20 18   Temp: 99.6 F (37.6 C) 99.7 F (37.6 C) 98.6 F (37 C) 98.8 F (37.1 C)  TempSrc: Oral Oral Oral Oral  SpO2: 100% 100% 95% 98%  Weight:      Height:        General: Pt is alert, awake, not in acute distress Cardiovascular: RRR, S1/S2 +, no rubs, no gallops Respiratory: CTA bilaterally, no wheezing, no rhonchi Abdominal: Soft, NT, ND, bowel sounds + Extremities: LE edema 2+ apparent baseline, no cyanosis   The results of significant diagnostics from this hospitalization (including imaging, microbiology, ancillary and laboratory) are listed below for reference.     Microbiology: Recent Results (from the past 240 hour(s))  Urine culture     Status: None (Preliminary result)   Collection Time: 10/03/16 12:28 AM  Result Value Ref Range Status   Specimen Description URINE, CLEAN CATCH  Final   Special Requests NONE  Final   Culture   Final    CULTURE REINCUBATED FOR BETTER GROWTH Performed at [facility], [address] [place], [place] [address]    Report Status PENDING  Incomplete     Labs: BNP (last 3 results) No results for input(s): BNP in the last 8760 hours. Basic Metabolic Panel:  Recent Labs Lab 10/03/16 0125 10/03/16 0145  10/04/16 0450  NA 137 139 138  K 3.8 3.8 3.6  CL 104 105 109  CO2 23  --  22  GLUCOSE 204* 199* 145*  BUN 38* 42* 37*  CREATININE 1.97* 2.00* 1.93*  CALCIUM 8.9  --  8.1*   Liver Function Tests:  Recent Labs Lab 10/03/16 0125  AST 34  ALT 29  ALKPHOS 128*  BILITOT 0.5  PROT 7.7  ALBUMIN 3.1*   No results for input(s): LIPASE, AMYLASE in the last 168 hours. No results for input(s): AMMONIA in the last 168 hours. CBC:  Recent Labs Lab 10/03/16 0125 10/03/16 0145 10/04/16 0450  WBC 11.4*  --  7.6  NEUTROABS 9.9*  --  6.1  HGB 10.5* 10.5* 9.7*  HCT 30.4* 31.0* 28.0*  MCV 96.5  --  96.6  PLT 245  --  221   Cardiac Enzymes:  Recent Labs Lab 10/03/16 0614 10/03/16 1222 10/03/16 1752  TROPONINI 0.18* 0.21* 0.13*   BNP: Invalid input(s): POCBNP CBG:  Recent Labs Lab 10/03/16 1300 10/03/16 1734 10/03/16 2147 10/04/16 0722 10/04/16 1206  GLUCAP 224* 155* 147* 124* 240*   D-Dimer No results for input(s): DDIMER in the last 72 hours. Hgb A1c No results for input(s): HGBA1C in the last 72 hours. Lipid Profile No results for input(s): CHOL, HDL, LDLCALC, TRIG, CHOLHDL, LDLDIRECT in the last 72 hours. Thyroid function studies No results for input(s): TSH, T4TOTAL, T3FREE, THYROIDAB in the last 72 hours.  Invalid input(s): FREET3 Anemia work up No results for input(s): VITAMINB12, FOLATE, FERRITIN, TIBC, IRON, RETICCTPCT in the last 72 hours. Urinalysis    Component Value Date/Time   COLORURINE YELLOW 10/03/2016 0028   APPEARANCEUR CLEAR 10/03/2016 0028   LABSPEC 1.015 10/03/2016 0028   PHURINE 5.5 10/03/2016 0028   GLUCOSEU NEGATIVE 10/03/2016 0028   GLUCOSEU 500 03/30/2011 1055  HGBUR NEGATIVE 10/03/2016 0028   HGBUR moderate 10/03/2008 1349   BILIRUBINUR NEGATIVE 10/03/2016 0028   KETONESUR NEGATIVE 10/03/2016 0028   PROTEINUR TRACE (A) 10/03/2016 0028   UROBILINOGEN 0.2 11/09/2014 1717   NITRITE POSITIVE (A) 10/03/2016 0028   LEUKOCYTESUR  TRACE (A) 10/03/2016 0028   Sepsis Labs Invalid input(s): PROCALCITONIN,  WBC,  LACTICIDVEN Microbiology Recent Results (from the past 240 hour(s))  Urine culture     Status: None (Preliminary result)   Collection Time: 10/03/16 12:28 AM  Result Value Ref Range Status   Specimen Description URINE, CLEAN CATCH  Final   Special Requests NONE  Final   Culture   Final    CULTURE REINCUBATED FOR BETTER GROWTH Performed at [facility], [place] [address], [place], [place] [address]    Report Status PENDING  Incomplete     Time coordinating discharge:  30 minutes  SIGNED:  Chipper Oman, MD  [facility] 10/04/2016, 4:08 PM  Pager please text page via  [email or web address] Password [email or web address]

## 2016-10-04 NOTE — Progress Notes (Signed)
Patient discharge at this time via EMS. Alert and verbally responsive and in no acute episode.

## 2016-10-04 NOTE — Clinical Social Work Placement (Signed)
Patient received and accepted bed offer at [facility]. [facility] contacted, family aware. Packet complete. Patient's RN can call report to [phone].  CLINICAL SOCIAL WORK PLACEMENT  NOTE  Date:  10/04/2016  Patient Details  Name: Eric [name] MRN: [id number] Date of Birth: April 29, 1923  Clinical Social Work is seeking post-discharge placement for this patient at the [place] level of care (*CSW will initial, date and re-position this form in  chart as items are completed):  Yes   Patient/family provided with [place] Work Department's list of facilities offering this level of care within the geographic area requested by the patient (or if unable, by the patient's family).  Yes   Patient/family informed of their freedom to choose among providers that offer the needed level of care, that participate in Medicare, Medicaid or managed care program needed by the patient, have an available bed and are willing to accept the patient.  Yes   Patient/family informed of [place]'s ownership interest in [facility] and [facility], as well as of the fact that they are under no obligation to receive care at these facilities.  PASRR submitted to EDS on       PASRR number received on       Existing PASRR number confirmed on 10/04/16     FL2 transmitted to all facilities in geographic area requested by pt/family on 10/04/16     FL2 transmitted to all facilities within larger geographic area on       Patient informed that his/her managed care company has contracts with or will negotiate with certain facilities, including the following:        Yes   Patient/family informed of bed offers received.  Patient chooses bed at [facility]     Physician recommends and patient chooses bed at      Patient to be transferred to [facility] on 10/04/16.  Patient to be transferred to facility by PTAR     Patient family notified on  10/04/16 of transfer.  Name of family member notified:  [facility]      PHYSICIAN       Additional Comment:    _______________________________________________ Burnis Medin, LCSW 10/04/2016, 4:50 PM

## 2016-10-04 NOTE — Evaluation (Signed)
Occupational Therapy Evaluation Patient Details Name: Eric [name] MRN: [id number] DOB: Jul 30, 1923 Today's Date: 10/04/2016    History of Present Illness Pt is a 83 year old male with past medical history TIA, hypertension, diabetes., CAD who presented to the ED complaining of weakness and episode of near collapse of his legs earlier today. He was admitted for suspected UTI and also was found to have mild elevated troponins.   Clinical Impression   This 83 y/o Pt presents with the above. At baseline Pt reports receives daily assist from personal care aide for ADLs and IADLs. Pt currently requires MaxA for LB ADLs and ModA for functional mobility with RW. Pt is unsure whether he could have 24 hr assist after return home, spoke with Pt about receiving additional rehab services prior to return home, and explained benefits of additional rehab services with Pt agreeable. Feel Pt would benefit from continued acute OT services and additional OT services in SNF setting prior to return home to maximize Pt's safety and independence with ADLs and functional mobility.     Follow Up Recommendations  SNF (vs 24 hr supervision )    Equipment Recommendations   (defer to next venue of care; anticipate will need 3:1)           Precautions / Restrictions Precautions Precautions: Fall Restrictions Weight Bearing Restrictions: No      Mobility Bed Mobility Overal bed mobility: Needs Assistance Bed Mobility: Supine to Sit     Supine to sit: HOB elevated;Min guard     General bed mobility comments: close guard for safety and verbal cues for scooting towards EOB for feet to rest on floor   Transfers Overall transfer level: Needs assistance Equipment used: Rolling walker (2 wheeled) Transfers: Sit to/from [facility] Sit to Stand: Mod assist Stand pivot transfers: Mod assist       General transfer comment: assist to rise, verbal cues for hand placement and for guiding RW  during stand-pivot to chair     Balance Overall balance assessment: Needs assistance Sitting-balance support: Feet supported;Single extremity supported Sitting balance-Leahy Scale: Fair     Standing balance support: Bilateral upper extremity supported Standing balance-Leahy Scale: Poor Standing balance comment: requires UE support                            ADL either performed or assessed with clinical judgement   ADL Overall ADL's : Needs assistance/impaired Eating/Feeding: Set up;Sitting   Grooming: Wash/dry face;Set up;Sitting   Upper Body Bathing: Min guard;Sitting   Lower Body Bathing: Moderate assistance;Sit to/from stand   Upper Body Dressing : Min guard;Sitting   Lower Body Dressing: Maximal assistance;Sit to/from stand Lower Body Dressing Details (indicate cue type and reason): able to reach bil LEs to don/doff socks  Toilet Transfer: Moderate assistance;Stand-pivot;BSC;RW   Toileting- Clothing Manipulation and Hygiene: Moderate assistance       Functional mobility during ADLs: Moderate assistance;Rolling walker                           Pertinent Vitals/Pain Pain Assessment: Faces Faces Pain Scale: No hurt     Hand Dominance     Extremity/Trunk Assessment Upper Extremity Assessment Upper Extremity Assessment: Generalized weakness   Lower Extremity Assessment Lower Extremity Assessment: Defer to PT evaluation       Communication Communication Communication: No difficulties   Cognition Arousal/Alertness: Awake/alert Behavior During Therapy: [facility] for tasks assessed/performed  Overall Cognitive Status: No family/caregiver present to determine baseline cognitive functioning                                 General Comments: Pt able to state he is in the hospital, disoriented to month and year    General Comments                  Home Living Family/patient expects to be discharged to:: Private residence Living  Arrangements: Alone Available Help at Discharge: Personal care attendant;Available PRN/intermittently Type of Home: House       Home Layout: One level     Bathroom Shower/Tub: Teacher, early years/pre: Standard     Home Equipment: Environmental consultant - 4 wheels;Shower seat          Prior Functioning/Environment Level of Independence: Needs assistance    ADL's / Homemaking Assistance Needed: Pt reports he has an aide who assists with dressing and bathing ADLs as well as IADLs including medication management and grocery shopping            OT Problem List: Decreased strength;Decreased activity tolerance;Impaired balance (sitting and/or standing)      OT Treatment/Interventions: Self-care/ADL training;DME and/or AE instruction;Therapeutic activities;Balance training;Therapeutic exercise;Energy conservation;Patient/family education    OT Goals(Current goals can be found in the care plan section) Acute Rehab OT Goals Patient Stated Goal: to regain strength/independence OT Goal Formulation: With patient Time For Goal Achievement: 10/18/16 Potential to Achieve Goals: Good ADL Goals Pt Will Perform Grooming: with set-up;sitting Pt Will Perform Lower Body Dressing: with min guard assist;sit to/from stand Pt Will Transfer to Toilet: with min guard assist;ambulating;bedside commode (BSC over toilet ) Pt Will Perform Toileting - Clothing Manipulation and hygiene: with min guard assist;sit to/from stand Pt/caregiver will Perform Home Exercise Program: Both right and left upper extremity;Increased strength;With Supervision  OT Frequency: Min 2X/week   Barriers to D/C: Decreased caregiver support  Pt unsure whether he could have someone available for 24 hr assist after discharge                      AM-PAC PT "6 Clicks" Daily Activity     Outcome Measure Help from another person eating meals?: None Help from another person taking care of personal grooming?: A Little Help  from another person toileting, which includes using toliet, bedpan, or urinal?: A Lot Help from another person bathing (including washing, rinsing, drying)?: A Lot Help from another person to put on and taking off regular upper body clothing?: A Little Help from another person to put on and taking off regular lower body clothing?: A Lot 6 Click Score: 16   End of Session Equipment Utilized During Treatment: Gait belt;Rolling walker Nurse Communication: Mobility status  Activity Tolerance: Patient tolerated treatment well Patient left: in chair;with call bell/phone within reach;with chair alarm set  OT Visit Diagnosis: Unsteadiness on feet (R26.81);Muscle weakness (generalized) (M62.81)                Time: 9798-9211 OT Time Calculation (min): 25 min Charges:  OT General Charges $OT Visit: 1 Procedure OT Evaluation $OT Eval Low Complexity: 1 Procedure G-Codes: OT G-codes **NOT FOR INPATIENT CLASS** Functional Assessment Tool Used: AM-PAC 6 Clicks Daily Activity;Clinical judgement Functional Limitation: Self care Self Care Current Status (H4174): At least 40 percent but less than 60 percent impaired, limited or restricted Self Care Goal Status (Y8144): At  least 1 percent but less than 20 percent impaired, limited or restricted   Lou Cal, OT Pager [phone] 10/04/2016   Raymondo Band 10/04/2016, 12:29 PM

## 2016-10-04 NOTE — NC FL2 (Signed)
[place] LEVEL OF CARE SCREENING TOOL     IDENTIFICATION  Patient Name: Eric [name] Birthdate: 05/15/1923 Sex: male Admission Date (Current Location): 10/02/2016  [facility] and [place] Number:  Herbalist and Address:  [facility],  [address] [place], [place]      Provider Number: [id number]  Attending Physician Name and Address:  Patrecia Pour, Christean Grief, MD  Relative Name and Phone Number:       Current Level of Care: Hospital Recommended Level of Care: [place] Prior Approval Number:    Date Approved/Denied:   PASRR Number:  [id number]  Discharge Plan: SNF    Current Diagnoses: Patient Active Problem List   Diagnosis Date Noted  . UTI (urinary tract infection) 10/03/2016  . Memory loss 06/22/2016  . AKI (acute kidney injury) ([place]) 11/12/2014  . TIA (transient ischemic attack) 11/09/2014  . Seizure ([place]) 02/08/2012  . Lesion of lung 02/08/2012  . DM2 (diabetes mellitus, type 2) ([place]) 02/08/2012  . Hypertensive encephalopathy 01/06/2012  . Hyperglycemia 01/06/2012  . MGUS (monoclonal gammopathy of unknown significance) 10/28/2011  . Bilateral leg edema 10/28/2011  . Fall at home 10/28/2011  . Failure to thrive in adult 10/28/2011  . Type 2 diabetes mellitus with hypoglycemia ([place]) 10/27/2011  . Dysphagia 10/27/2011  . Encephalopathy 10/27/2011  . Hypernatremia 10/24/2011  . Altered mental status 10/20/2011  . Generalized weakness 10/18/2011  . Encephalopathy, metabolic   . Dependent edema 10/02/2011  . Hyponatremia 10/02/2011  . Pneumonia 04/24/2011  . CKD (chronic kidney disease) stage 3, GFR 30-59 ml/min 04/19/2011  . Bronchitis, acute 04/16/2011  . Edema 03/30/2011  . OSTEOARTHRITIS 03/04/2010  . LIVER DISORDER 12/09/2009  . BACK PAIN, LUMBAR 09/09/2009  . ABDOMINAL PAIN, UNSPECIFIED SITE 01/31/2009  . SYNCOPE 01/08/2009  . ELBOW PAIN, LEFT 12/30/2008  . ABNORMAL ELECTROCARDIOGRAM 12/30/2008   . DEPRESSION 12/25/2008  . WEIGHT LOSS 12/25/2008  . NOSEBLEED 12/25/2008  . VITAMIN B12 DEFICIENCY 10/24/2008  . DM (diabetes mellitus), type 1, uncontrolled w/ophthalmic complication ([place]) 66/09/3014  . PANCYTOPENIA 10/08/2008  . PSA, INCREASED 10/08/2008  . DYSURIA 10/03/2008  . LEG PAIN, BILATERAL 12/26/2007  . KNEE PAIN, LEFT 08/18/2007  . COUGH 04/18/2007  . Hyperlipidemia 02/17/2007  . ANEMIA-NOS 02/17/2007  . Essential hypertension 02/17/2007  . ALLERGIC RHINITIS 02/17/2007  . GERD 02/17/2007  . GASTROPARESIS 02/17/2007  . Benign prostatic hyperplasia 02/17/2007  . HYPERURICEMIA 02/17/2007    Orientation RESPIRATION BLADDER Height & Weight     Self  Normal Incontinent Weight: 175 lb 14.8 oz (79.8 kg) Height:  5\' 9"  (175.3 cm)  BEHAVIORAL SYMPTOMS/MOOD NEUROLOGICAL BOWEL NUTRITION STATUS        Diet (Carb Modified)  AMBULATORY STATUS COMMUNICATION OF NEEDS Skin   Extensive Assist Verbally Normal                       Personal Care Assistance Level of Assistance  Bathing, Feeding, Dressing Bathing Assistance: Limited assistance Feeding assistance: Independent Dressing Assistance: Limited assistance     Functional Limitations Info             SPECIAL CARE FACTORS FREQUENCY  PT (By licensed PT), OT (By licensed OT)     PT Frequency: 5x/week OT Frequency: 5x/week            Contractures Contractures Info: Not present    Additional Factors Info  Code Status, Allergies Code Status Info: Full Code Allergies Info: Percocet Oxycodone-acetaminophen  Current Medications (10/04/2016):  This is the current hospital active medication list Current Facility-Administered Medications  Medication Dose Route Frequency Provider Last Rate Last Dose  . acetaminophen (TYLENOL) tablet 650 mg  650 mg Oral Q6H PRN Etta Quill, DO       Or  . acetaminophen (TYLENOL) suppository 650 mg  650 mg Rectal Q6H PRN Etta Quill, DO      .  acetaminophen (TYLENOL) tablet 500 mg  500 mg Oral BID Jennette Kettle M, DO   500 mg at 10/04/16 1191  . amLODipine (NORVASC) tablet 10 mg  10 mg Oral Daily Jennette Kettle M, DO   10 mg at 10/04/16 4782  . atorvastatin (LIPITOR) tablet 20 mg  20 mg Oral q1800 Jennette Kettle M, DO   20 mg at 10/03/16 1655  . benzonatate (TESSALON) capsule 200 mg  200 mg Oral TID PRN Etta Quill, DO   200 mg at 10/03/16 2144  . calcitRIOL (ROCALTROL) capsule 0.25 mcg  0.25 mcg Oral Daily Jennette Kettle M, DO   0.25 mcg at 10/04/16 1235  . cefTRIAXone (ROCEPHIN) 1 g in dextrose 5 % 50 mL IVPB  1 g Intravenous Q24H Etta Quill, DO   Stopped at 10/04/16 0203  . cholecalciferol (VITAMIN D) tablet 2,000 Units  2,000 Units Oral Daily Etta Quill, DO   2,000 Units at 10/04/16 0813  . docusate sodium (COLACE) capsule 100 mg  100 mg Oral Daily Jennette Kettle M, DO   100 mg at 10/04/16 0813  . donepezil (ARICEPT) tablet 10 mg  10 mg Oral QHS Jennette Kettle M, DO   10 mg at 10/03/16 2144  . fluticasone (FLONASE) 50 MCG/ACT nasal spray 1 spray  1 spray Each Nare BID Patrecia Pour, Christean Grief, MD   1 spray at 10/04/16 1236  . hydrALAZINE (APRESOLINE) tablet 75 mg  75 mg Oral TID Etta Quill, DO   75 mg at 10/04/16 0813  . hydrOXYzine (ATARAX/VISTARIL) tablet 10 mg  10 mg Oral BID PRN Etta Quill, DO   10 mg at 10/03/16 2143  . insulin aspart (novoLOG) injection 0-9 Units  0-9 Units Subcutaneous TID WC Etta Quill, DO   3 Units at 10/04/16 1234  . insulin glargine (LANTUS) injection 20 Units  20 Units Subcutaneous Daily Etta Quill, DO   20 Units at 10/04/16 1235  . latanoprost (XALATAN) 0.005 % ophthalmic solution 1 drop  1 drop Both Eyes QHS Jennette Kettle M, DO   1 drop at 10/03/16 2140  . lisinopril (PRINIVIL,ZESTRIL) tablet 20 mg  20 mg Oral Daily Jennette Kettle M, DO   20 mg at 10/04/16 0813  . loratadine (CLARITIN) tablet 10 mg  10 mg Oral Q lunch Etta Quill, DO   10 mg at 10/04/16 1235  .  metoprolol succinate (TOPROL-XL) 24 hr tablet 25 mg  25 mg Oral Daily Jennette Kettle M, DO   25 mg at 10/04/16 0813  . ondansetron (ZOFRAN) tablet 4 mg  4 mg Oral Q6H PRN Etta Quill, DO       Or  . ondansetron [facility]) injection 4 mg  4 mg Intravenous Q6H PRN Etta Quill, DO      . pantoprazole (PROTONIX) EC tablet 40 mg  40 mg Oral Daily Jennette Kettle M, DO   40 mg at 10/04/16 9562  . phenytoin (DILANTIN) ER capsule 300 mg  300 mg Oral QHS Jennette Kettle M, DO   300 mg  at 10/03/16 2144  . polyethylene glycol (MIRALAX / GLYCOLAX) packet 17 g  17 g Oral Daily PRN Etta Quill, DO      . polyvinyl alcohol (LIQUIFILM TEARS) 1.4 % ophthalmic solution 1 drop  1 drop Both Eyes TID PRN Etta Quill, DO      . senna (SENOKOT) tablet 8.6 mg  1 tablet Oral Daily Jennette Kettle M, DO   8.6 mg at 10/04/16 0814  . sodium chloride flush (NS) 0.9 % injection 3 mL  3 mL Intravenous Q12H Jennette Kettle M, DO   3 mL at 10/04/16 1236  . warfarin (COUMADIN) tablet 6 mg  6 mg Oral Once per day on Sun Mon Wed Fri Sat Patrecia Pour, Christean Grief, MD   6 mg at 10/03/16 1655   And  . [START ON 10/05/2016] warfarin (COUMADIN) tablet 3 mg  3 mg Oral Once per day on Tue Thu Doreatha Lew, MD      . Warfarin - Pharmacist Dosing Inpatient   Does not apply q1800 Doreatha Lew, MD         Discharge Medications: Please see discharge summary for a list of discharge medications.  Relevant Imaging Results:  Relevant Lab Results:   Additional Information SSN [id number]  Burnis Medin, LCSW

## 2016-10-06 LAB — URINE CULTURE

## 2016-10-30 ENCOUNTER — Emergency Department (HOSPITAL_COMMUNITY): Payer: Medicare Other

## 2016-10-30 ENCOUNTER — Inpatient Hospital Stay (HOSPITAL_COMMUNITY)
Admission: EM | Admit: 2016-10-30 | Discharge: 2016-11-03 | DRG: 871 | Disposition: A | Discharge status: Home Health | Payer: Medicare Other | Attending: Internal Medicine | Admitting: Internal Medicine

## 2016-10-30 ENCOUNTER — Encounter (HOSPITAL_COMMUNITY): Payer: Self-pay

## 2016-10-30 DIAGNOSIS — R6520 Severe sepsis without septic shock: Secondary | ICD-10-CM | POA: Diagnosis present

## 2016-10-30 DIAGNOSIS — E871 Hypo-osmolality and hyponatremia: Secondary | ICD-10-CM | POA: Diagnosis present

## 2016-10-30 DIAGNOSIS — D638 Anemia in other chronic diseases classified elsewhere: Secondary | ICD-10-CM | POA: Diagnosis present

## 2016-10-30 DIAGNOSIS — K219 Gastro-esophageal reflux disease without esophagitis: Secondary | ICD-10-CM | POA: Diagnosis present

## 2016-10-30 DIAGNOSIS — Z833 Family history of diabetes mellitus: Secondary | ICD-10-CM | POA: Diagnosis not present

## 2016-10-30 DIAGNOSIS — E1022 Type 1 diabetes mellitus with diabetic chronic kidney disease: Secondary | ICD-10-CM | POA: Diagnosis present

## 2016-10-30 DIAGNOSIS — Z86718 Personal history of other venous thrombosis and embolism: Secondary | ICD-10-CM

## 2016-10-30 DIAGNOSIS — E876 Hypokalemia: Secondary | ICD-10-CM | POA: Diagnosis present

## 2016-10-30 DIAGNOSIS — E1122 Type 2 diabetes mellitus with diabetic chronic kidney disease: Secondary | ICD-10-CM

## 2016-10-30 DIAGNOSIS — E1043 Type 1 diabetes mellitus with diabetic autonomic (poly)neuropathy: Secondary | ICD-10-CM | POA: Diagnosis present

## 2016-10-30 DIAGNOSIS — I10 Essential (primary) hypertension: Secondary | ICD-10-CM | POA: Diagnosis present

## 2016-10-30 DIAGNOSIS — K5900 Constipation, unspecified: Secondary | ICD-10-CM | POA: Diagnosis present

## 2016-10-30 DIAGNOSIS — Z9181 History of falling: Secondary | ICD-10-CM

## 2016-10-30 DIAGNOSIS — N400 Enlarged prostate without lower urinary tract symptoms: Secondary | ICD-10-CM | POA: Diagnosis present

## 2016-10-30 DIAGNOSIS — I5032 Chronic diastolic (congestive) heart failure: Secondary | ICD-10-CM | POA: Diagnosis present

## 2016-10-30 DIAGNOSIS — E538 Deficiency of other specified B group vitamins: Secondary | ICD-10-CM | POA: Diagnosis present

## 2016-10-30 DIAGNOSIS — M1990 Unspecified osteoarthritis, unspecified site: Secondary | ICD-10-CM | POA: Diagnosis present

## 2016-10-30 DIAGNOSIS — W19XXXA Unspecified fall, initial encounter: Secondary | ICD-10-CM

## 2016-10-30 DIAGNOSIS — Z8673 Personal history of transient ischemic attack (TIA), and cerebral infarction without residual deficits: Secondary | ICD-10-CM | POA: Diagnosis not present

## 2016-10-30 DIAGNOSIS — A419 Sepsis, unspecified organism: Principal | ICD-10-CM | POA: Diagnosis present

## 2016-10-30 DIAGNOSIS — N183 Chronic kidney disease, stage 3 unspecified: Secondary | ICD-10-CM | POA: Diagnosis present

## 2016-10-30 DIAGNOSIS — D472 Monoclonal gammopathy: Secondary | ICD-10-CM | POA: Diagnosis present

## 2016-10-30 DIAGNOSIS — E119 Type 2 diabetes mellitus without complications: Secondary | ICD-10-CM

## 2016-10-30 DIAGNOSIS — E785 Hyperlipidemia, unspecified: Secondary | ICD-10-CM | POA: Diagnosis present

## 2016-10-30 DIAGNOSIS — J189 Pneumonia, unspecified organism: Secondary | ICD-10-CM | POA: Diagnosis present

## 2016-10-30 DIAGNOSIS — R4182 Altered mental status, unspecified: Secondary | ICD-10-CM | POA: Diagnosis present

## 2016-10-30 DIAGNOSIS — N390 Urinary tract infection, site not specified: Secondary | ICD-10-CM | POA: Diagnosis present

## 2016-10-30 DIAGNOSIS — Z794 Long term (current) use of insulin: Secondary | ICD-10-CM | POA: Diagnosis not present

## 2016-10-30 DIAGNOSIS — Y95 Nosocomial condition: Secondary | ICD-10-CM | POA: Diagnosis present

## 2016-10-30 DIAGNOSIS — Z885 Allergy status to narcotic agent status: Secondary | ICD-10-CM

## 2016-10-30 DIAGNOSIS — I2510 Atherosclerotic heart disease of native coronary artery without angina pectoris: Secondary | ICD-10-CM | POA: Diagnosis present

## 2016-10-30 DIAGNOSIS — Z8042 Family history of malignant neoplasm of prostate: Secondary | ICD-10-CM | POA: Diagnosis not present

## 2016-10-30 DIAGNOSIS — I130 Hypertensive heart and chronic kidney disease with heart failure and stage 1 through stage 4 chronic kidney disease, or unspecified chronic kidney disease: Secondary | ICD-10-CM | POA: Diagnosis present

## 2016-10-30 DIAGNOSIS — F0390 Unspecified dementia without behavioral disturbance: Secondary | ICD-10-CM | POA: Diagnosis present

## 2016-10-30 DIAGNOSIS — R569 Unspecified convulsions: Secondary | ICD-10-CM | POA: Diagnosis present

## 2016-10-30 DIAGNOSIS — H409 Unspecified glaucoma: Secondary | ICD-10-CM | POA: Diagnosis present

## 2016-10-30 LAB — COMPREHENSIVE METABOLIC PANEL
ALBUMIN: 3.1 g/dL — AB (ref 3.5–5.0)
ALK PHOS: 123 U/L (ref 38–126)
ALT: 20 U/L (ref 17–63)
ANION GAP: 11 (ref 5–15)
AST: 27 U/L (ref 15–41)
BILIRUBIN TOTAL: 0.4 mg/dL (ref 0.3–1.2)
BUN: 36 mg/dL — ABNORMAL HIGH (ref 6–20)
CALCIUM: 9 mg/dL (ref 8.9–10.3)
CO2: 20 mmol/L — ABNORMAL LOW (ref 22–32)
CREATININE: 1.89 mg/dL — AB (ref 0.61–1.24)
Chloride: 105 mmol/L (ref 101–111)
GFR calc Af Amer: 34 mL/min — ABNORMAL LOW (ref >60)
GFR calc non Af Amer: 30 mL/min — ABNORMAL LOW (ref >60)
GLUCOSE: 89 mg/dL (ref 65–99)
Potassium: 3.7 mmol/L (ref 3.5–5.1)
Sodium: 136 mmol/L (ref 135–145)
TOTAL PROTEIN: 7.9 g/dL (ref 6.5–8.1)

## 2016-10-30 LAB — URINALYSIS, ROUTINE W REFLEX MICROSCOPIC
Bilirubin Urine: NEGATIVE
GLUCOSE, UA: NEGATIVE mg/dL
Ketones, ur: NEGATIVE mg/dL
LEUKOCYTES UA: NEGATIVE
NITRITE: NEGATIVE
PH: 5 (ref 5.0–8.0)
Protein, ur: 100 mg/dL — AB
SPECIFIC GRAVITY, URINE: 1.014 (ref 1.005–1.030)

## 2016-10-30 LAB — I-STAT CHEM 8, ED
BUN: 37 mg/dL — ABNORMAL HIGH (ref 6–20)
CHLORIDE: 105 mmol/L (ref 101–111)
Calcium, Ion: 1.11 mmol/L — ABNORMAL LOW (ref 1.15–1.40)
Creatinine, Ser: 1.8 mg/dL — ABNORMAL HIGH (ref 0.61–1.24)
Glucose, Bld: 88 mg/dL (ref 65–99)
HCT: 33 % — ABNORMAL LOW (ref 39.0–52.0)
HEMOGLOBIN: 11.2 g/dL — AB (ref 13.0–17.0)
POTASSIUM: 3.8 mmol/L (ref 3.5–5.1)
SODIUM: 139 mmol/L (ref 135–145)
TCO2: 22 mmol/L (ref 0–100)

## 2016-10-30 LAB — GLUCOSE, CAPILLARY: Glucose-Capillary: 86 mg/dL (ref 65–99)

## 2016-10-30 LAB — PROTIME-INR
INR: 1.24
PROTHROMBIN TIME: 15.7 s — AB (ref 11.4–15.2)

## 2016-10-30 LAB — CBC WITH DIFFERENTIAL/PLATELET
BASOS ABS: 0 10*3/uL (ref 0.0–0.1)
BASOS PCT: 0 %
EOS ABS: 0 10*3/uL (ref 0.0–0.7)
EOS PCT: 0 %
HEMATOCRIT: 30.6 % — AB (ref 39.0–52.0)
Hemoglobin: 10.2 g/dL — ABNORMAL LOW (ref 13.0–17.0)
Lymphocytes Relative: 9 %
Lymphs Abs: 0.7 10*3/uL (ref 0.7–4.0)
MCH: 33.1 pg (ref 26.0–34.0)
MCHC: 33.3 g/dL (ref 30.0–36.0)
MCV: 99.4 fL (ref 78.0–100.0)
MONO ABS: 0.2 10*3/uL (ref 0.1–1.0)
Monocytes Relative: 2 %
NEUTROS ABS: 6.9 10*3/uL (ref 1.7–7.7)
Neutrophils Relative %: 89 %
PLATELETS: 215 10*3/uL (ref 150–400)
RBC: 3.08 MIL/uL — ABNORMAL LOW (ref 4.22–5.81)
RDW: 14 % (ref 11.5–15.5)
WBC: 7.8 10*3/uL (ref 4.0–10.5)

## 2016-10-30 LAB — LACTIC ACID, PLASMA
Lactic Acid, Venous: 1.3 mmol/L (ref 0.5–1.9)
Lactic Acid, Venous: 1.4 mmol/L (ref 0.5–1.9)

## 2016-10-30 LAB — I-STAT TROPONIN, ED: Troponin i, poc: 0.06 ng/mL (ref 0.00–0.08)

## 2016-10-30 LAB — CBG MONITORING, ED: GLUCOSE-CAPILLARY: 94 mg/dL (ref 65–99)

## 2016-10-30 LAB — I-STAT CG4 LACTIC ACID, ED: Lactic Acid, Venous: 2.11 mmol/L (ref 0.5–1.9)

## 2016-10-30 MED ORDER — HEPARIN SODIUM (PORCINE) 5000 UNIT/ML IJ SOLN
5000.0000 [IU] | Freq: Three times a day (TID) | INTRAMUSCULAR | Status: DC
  Administered 2016-10-30 – 2016-10-31 (×2): 5000 [IU] via SUBCUTANEOUS
  Filled 2016-10-30 (×2): qty 1

## 2016-10-30 MED ORDER — ONDANSETRON HCL 4 MG PO TABS: 4.0000 mg | ORAL_TABLET | Freq: Four times a day (QID) | ORAL | Status: DC | PRN

## 2016-10-30 MED ORDER — DEXTROSE 5 % IV SOLN
1.0000 g | INTRAVENOUS | Status: DC
  Administered 2016-10-31: 1 g via INTRAVENOUS
  Filled 2016-10-30: qty 1

## 2016-10-30 MED ORDER — SODIUM CHLORIDE 0.9 % IV SOLN
1500.0000 mg | Freq: Once | INTRAVENOUS | Status: AC
  Administered 2016-10-30: 1500 mg via INTRAVENOUS
  Filled 2016-10-30: qty 1500

## 2016-10-30 MED ORDER — POLYETHYLENE GLYCOL 3350 17 GM/SCOOP PO POWD
17.0000 g | Freq: Every day | ORAL | Status: DC | PRN
  Filled 2016-10-30: qty 255

## 2016-10-30 MED ORDER — PIPERACILLIN-TAZOBACTAM 3.375 G IVPB 30 MIN
3.3750 g | Freq: Once | INTRAVENOUS | Status: DC
  Filled 2016-10-30: qty 50

## 2016-10-30 MED ORDER — METOPROLOL SUCCINATE ER 25 MG PO TB24
25.0000 mg | ORAL_TABLET | Freq: Every day | ORAL | Status: DC
  Administered 2016-10-31 – 2016-11-03 (×4): 25 mg via ORAL
  Filled 2016-10-30 (×4): qty 1

## 2016-10-30 MED ORDER — POLYETHYLENE GLYCOL 3350 17 G PO PACK: 17.0000 g | PACK | Freq: Every day | ORAL | Status: DC | PRN

## 2016-10-30 MED ORDER — SODIUM CHLORIDE 0.9 % IV BOLUS (SEPSIS)
1000.0000 mL | Freq: Once | INTRAVENOUS | Status: AC
  Administered 2016-10-30: 1000 mL via INTRAVENOUS

## 2016-10-30 MED ORDER — POLYVINYL ALCOHOL 1.4 % OP SOLN
1.0000 [drp] | OPHTHALMIC | Status: DC | PRN
  Filled 2016-10-30: qty 15

## 2016-10-30 MED ORDER — ALBUTEROL SULFATE (2.5 MG/3ML) 0.083% IN NEBU: 2.5000 mg | INHALATION_SOLUTION | RESPIRATORY_TRACT | Status: DC | PRN

## 2016-10-30 MED ORDER — INSULIN GLARGINE 100 UNIT/ML SOLOSTAR PEN: 20.0000 [IU] | PEN_INJECTOR | Freq: Every day | SUBCUTANEOUS | Status: DC

## 2016-10-30 MED ORDER — FLUTICASONE PROPIONATE 50 MCG/ACT NA SUSP
1.0000 | Freq: Two times a day (BID) | NASAL | Status: DC
  Administered 2016-10-30 – 2016-11-03 (×9): 1 via NASAL
  Filled 2016-10-30: qty 16

## 2016-10-30 MED ORDER — ACETAMINOPHEN 500 MG PO TABS
500.0000 mg | ORAL_TABLET | Freq: Two times a day (BID) | ORAL | Status: DC
  Administered 2016-10-30 – 2016-11-03 (×9): 500 mg via ORAL
  Filled 2016-10-30 (×9): qty 1

## 2016-10-30 MED ORDER — HYDRALAZINE HCL 25 MG PO TABS
75.0000 mg | ORAL_TABLET | Freq: Three times a day (TID) | ORAL | Status: DC
Start: 2016-10-30 — End: 2016-11-03
  Administered 2016-10-30 – 2016-11-03 (×12): 75 mg via ORAL
  Filled 2016-10-30 (×12): qty 3

## 2016-10-30 MED ORDER — VANCOMYCIN HCL IN DEXTROSE 750-5 MG/150ML-% IV SOLN
750.0000 mg | INTRAVENOUS | Status: DC
  Administered 2016-10-31 – 2016-11-03 (×4): 750 mg via INTRAVENOUS
  Filled 2016-10-30 (×4): qty 150

## 2016-10-30 MED ORDER — LISINOPRIL 20 MG PO TABS
20.0000 mg | ORAL_TABLET | Freq: Every day | ORAL | Status: DC
  Administered 2016-10-31 – 2016-11-03 (×4): 20 mg via ORAL
  Filled 2016-10-30 (×7): qty 1

## 2016-10-30 MED ORDER — ACETAMINOPHEN 325 MG PO TABS: 650.0000 mg | ORAL_TABLET | Freq: Four times a day (QID) | ORAL | Status: DC | PRN

## 2016-10-30 MED ORDER — HYDRALAZINE HCL 20 MG/ML IJ SOLN: 10.0000 mg | Freq: Three times a day (TID) | INTRAMUSCULAR | Status: DC | PRN

## 2016-10-30 MED ORDER — POLYETHYL GLYCOL-PROPYL GLYCOL 0.4-0.3 % OP SOLN: 1.0000 [drp] | Freq: Three times a day (TID) | OPHTHALMIC | Status: DC | PRN

## 2016-10-30 MED ORDER — DONEPEZIL HCL 10 MG PO TABS
10.0000 mg | ORAL_TABLET | Freq: Every day | ORAL | Status: DC
  Administered 2016-10-30 – 2016-11-02 (×4): 10 mg via ORAL
  Filled 2016-10-30 (×5): qty 1

## 2016-10-30 MED ORDER — DEXTROSE 5 % IV SOLN
1.0000 g | Freq: Once | INTRAVENOUS | Status: AC
  Administered 2016-10-30: 1 g via INTRAVENOUS
  Filled 2016-10-30: qty 1

## 2016-10-30 MED ORDER — ATORVASTATIN CALCIUM 20 MG PO TABS
20.0000 mg | ORAL_TABLET | Freq: Every day | ORAL | Status: DC
  Administered 2016-10-30 – 2016-11-02 (×4): 20 mg via ORAL
  Filled 2016-10-30 (×4): qty 1

## 2016-10-30 MED ORDER — INSULIN GLARGINE 100 UNIT/ML ~~LOC~~ SOLN
20.0000 [IU] | Freq: Every day | SUBCUTANEOUS | Status: DC
  Administered 2016-10-30 – 2016-11-03 (×5): 20 [IU] via SUBCUTANEOUS
  Filled 2016-10-30 (×5): qty 0.2

## 2016-10-30 MED ORDER — PANTOPRAZOLE SODIUM 40 MG PO TBEC
40.0000 mg | DELAYED_RELEASE_TABLET | Freq: Every day | ORAL | Status: DC
  Administered 2016-10-30 – 2016-11-03 (×5): 40 mg via ORAL
  Filled 2016-10-30 (×5): qty 1

## 2016-10-30 MED ORDER — SODIUM CHLORIDE 0.9 % IV SOLN
INTRAVENOUS | Status: AC
  Administered 2016-10-30 – 2016-10-31 (×2): via INTRAVENOUS

## 2016-10-30 MED ORDER — CLONIDINE HCL 0.1 MG PO TABS
0.1000 mg | ORAL_TABLET | Freq: Two times a day (BID) | ORAL | Status: DC
  Administered 2016-10-31 – 2016-11-03 (×7): 0.1 mg via ORAL
  Filled 2016-10-30 (×7): qty 1

## 2016-10-30 MED ORDER — PHENYTOIN SODIUM EXTENDED 100 MG PO CAPS
300.0000 mg | ORAL_CAPSULE | Freq: Every day | ORAL | Status: DC
  Administered 2016-10-30 – 2016-11-02 (×4): 300 mg via ORAL
  Filled 2016-10-30 (×4): qty 3

## 2016-10-30 MED ORDER — FUROSEMIDE 20 MG PO TABS: 40.0000 mg | ORAL_TABLET | Freq: Every day | ORAL | Status: DC

## 2016-10-30 MED ORDER — ACETAMINOPHEN 650 MG RE SUPP: 650.0000 mg | Freq: Four times a day (QID) | RECTAL | Status: DC | PRN

## 2016-10-30 MED ORDER — LORAZEPAM 2 MG/ML IJ SOLN: 1.0000 mg | INTRAMUSCULAR | Status: DC | PRN

## 2016-10-30 MED ORDER — ONDANSETRON HCL 4 MG/2ML IJ SOLN
4.0000 mg | Freq: Four times a day (QID) | INTRAMUSCULAR | Status: DC | PRN
  Administered 2016-10-30: 4 mg via INTRAVENOUS
  Filled 2016-10-30: qty 2

## 2016-10-30 MED ORDER — IPRATROPIUM-ALBUTEROL 0.5-2.5 (3) MG/3ML IN SOLN
3.0000 mL | Freq: Four times a day (QID) | RESPIRATORY_TRACT | Status: DC
  Administered 2016-10-30 (×2): 3 mL via RESPIRATORY_TRACT
  Filled 2016-10-30 (×2): qty 3

## 2016-10-30 MED ORDER — LATANOPROST 0.005 % OP SOLN
1.0000 [drp] | Freq: Every day | OPHTHALMIC | Status: DC
  Administered 2016-10-30 – 2016-11-02 (×4): 1 [drp] via OPHTHALMIC
  Filled 2016-10-30: qty 2.5

## 2016-10-30 MED ORDER — GUAIFENESIN ER 600 MG PO TB12
600.0000 mg | ORAL_TABLET | Freq: Two times a day (BID) | ORAL | Status: DC
  Administered 2016-10-30 – 2016-11-03 (×9): 600 mg via ORAL
  Filled 2016-10-30 (×9): qty 1

## 2016-10-30 NOTE — ED Provider Notes (Signed)
[facility] DEPT Provider Note   CSN: [id number] Arrival date & time: 10/30/16  0240     History   Chief Complaint Chief Complaint  Patient presents with  . Altered Mental Status  . Fall    HPI Eric [name] is a 83 y.o. male.  The history is provided by the patient and the EMS personnel. No language interpreter was used.  Altered Mental Status    Fall    Eric [name] is a 83 y.o. male who presents to the Emergency Department complaining of AMS.  He presents from home for evaluation of altered mental status and a fall. He was recently discharged from a rehabilitation facility following hospitalization for UTI with sepsis. He was getting out of bed with the assistance of S caregiver when he fell back, striking his head on the ground. No known loss of consciousness. Per reports he is confused and had a temperature to 100.7 per EMS with some nausea. His Coumadin has been held for the last 2 days. No reports of fever, vomiting, cough from home. Patient does endorse some mild abdominal discomfort today. Level V caveat due to confusion.  For EMS he complained of head and neck pain. Patient denies any head or neck pain in the emergency department. He received Tylenol and Zofran per EMS. Past Medical History:  Diagnosis Date  . Adult failure to thrive   . Altered mental status   . ANEMIA-NOS 02/17/2007  . Anxiety   . BACK PAIN, LUMBAR   . BENIGN PROSTATIC HYPERTROPHY   . Bilateral leg edema   . CAP (community acquired pneumonia)   . CKD (chronic kidney disease)   . DEPRESSION 12/25/2008  . Diabetes mellitus   . DIABETES MELLITUS, TYPE I, CONTROLLED, WITH RETINOPATHY   . Encephalopathy, metabolic   . Gastroparesis   . GERD 02/17/2007  . Hyperlipidemia   . HYPERTENSION 02/17/2007  . Hyponatremia   . LIVER DISORDER   . OSTEOARTHRITIS 03/04/2010  . Pneumonia   . PSA, INCREASED   . Stroke ([place])   . VITAMIN B12 DEFICIENCY     Patient Active Problem List   Diagnosis Date  Noted  . Sepsis due to pneumonia ([place]) 10/30/2016  . History of DVT (deep vein thrombosis) 10/30/2016  . Sepsis ([place]) 10/30/2016  . UTI (urinary tract infection) 10/03/2016  . Memory loss 06/22/2016  . AKI (acute kidney injury) ([place]) 11/12/2014  . TIA (transient ischemic attack) 11/09/2014  . Seizure ([place]) 02/08/2012  . Lesion of lung 02/08/2012  . DM2 (diabetes mellitus, type 2) ([place]) 02/08/2012  . Hypertensive encephalopathy 01/06/2012  . Hyperglycemia 01/06/2012  . MGUS (monoclonal gammopathy of unknown significance) 10/28/2011  . Bilateral leg edema 10/28/2011  . Fall at home 10/28/2011  . Failure to thrive in adult 10/28/2011  . Type 2 diabetes mellitus with hypoglycemia ([place]) 10/27/2011  . Dysphagia 10/27/2011  . Encephalopathy 10/27/2011  . Hypernatremia 10/24/2011  . Altered mental status 10/20/2011  . Generalized weakness 10/18/2011  . Encephalopathy, metabolic   . Dependent edema 10/02/2011  . Hyponatremia 10/02/2011  . Pneumonia 04/24/2011  . CKD (chronic kidney disease) stage 3, GFR 30-59 ml/min 04/19/2011  . Bronchitis, acute 04/16/2011  . Edema 03/30/2011  . OSTEOARTHRITIS 03/04/2010  . LIVER DISORDER 12/09/2009  . BACK PAIN, LUMBAR 09/09/2009  . ABDOMINAL PAIN, UNSPECIFIED SITE 01/31/2009  . SYNCOPE 01/08/2009  . ELBOW PAIN, LEFT 12/30/2008  . ABNORMAL ELECTROCARDIOGRAM 12/30/2008  . DEPRESSION 12/25/2008  . WEIGHT LOSS 12/25/2008  . NOSEBLEED 12/25/2008  .  VITAMIN B12 DEFICIENCY 10/24/2008  . DM (diabetes mellitus), type 1, uncontrolled w/ophthalmic complication ([place]) 62/37/6283  . PANCYTOPENIA 10/08/2008  . PSA, INCREASED 10/08/2008  . DYSURIA 10/03/2008  . LEG PAIN, BILATERAL 12/26/2007  . KNEE PAIN, LEFT 08/18/2007  . COUGH 04/18/2007  . Hyperlipidemia 02/17/2007  . ANEMIA-NOS 02/17/2007  . Essential hypertension 02/17/2007  . ALLERGIC RHINITIS 02/17/2007  . GERD 02/17/2007  . GASTROPARESIS 02/17/2007  . Benign prostatic hyperplasia 02/17/2007    . HYPERURICEMIA 02/17/2007    Past Surgical History:  Procedure Laterality Date  . LEFT HEART CATHETERIZATION WITH CORONARY ANGIOGRAM N/A 04/23/2011   Procedure: LEFT HEART CATHETERIZATION WITH CORONARY ANGIOGRAM;  Surgeon: Peter M Martinique, MD;  Location: [facility] CATH LAB;  Service: Cardiovascular;  Laterality: N/A;  . TRANSURETHRAL RESECTION OF PROSTATE         Home Medications    Prior to Admission medications   Medication Sig Start Date End Date Taking? Authorizing Provider  ACCU-CHEK AVIVA PLUS test strip  05/01/14  Yes [provider]  acetaminophen (TYLENOL) 500 MG tablet Take 500 mg by mouth 2 (two) times daily. Reported on 06/19/2015   Yes [provider]  amLODipine (NORVASC) 10 MG tablet Take 10 mg by mouth daily.  05/26/16  Yes [provider]  atorvastatin (LIPITOR) 20 MG tablet Take 1 tablet (20 mg total) by mouth daily at 6 PM. 11/13/14  Yes Charlynne Cousins, MD  B-D UF III MINI PEN NEEDLES 31G X 5 MM MISC  06/20/14  Yes [provider]  calcitRIOL (ROCALTROL) 0.25 MCG capsule Take 0.25 mcg by mouth daily.  12/26/11  Yes [provider]  cholecalciferol (VITAMIN D) 1000 UNITS tablet Take 2,000 Units by mouth daily.    Yes [provider]  cloNIDine (CATAPRES) 0.1 MG tablet Take 0.1 mg by mouth 2 (two) times daily as needed.  05/03/16  Yes [provider]  docusate sodium (COLACE) 100 MG capsule Take 100 mg by mouth daily.   Yes [provider]  donepezil (ARICEPT) 10 MG tablet Take 1 tablet (10 mg total) by mouth at bedtime. 06/22/16  Yes Dennie Bible, NP  fluticasone [facility]) 50 MCG/ACT nasal spray Place 1 spray into both nostrils 2 (two) times daily. 10/04/16  Yes Patrecia Pour, Christean Grief, MD  furosemide (LASIX) 20 MG tablet Take 1 tablet (20 mg total) by mouth 2 (two) times daily. Patient taking differently: Take 40 mg by mouth daily.  11/15/14  Yes Charlynne Cousins, MD  hydrALAZINE (APRESOLINE) 25 MG  tablet Take 75 mg by mouth 3 (three) times daily.  10/22/14  Yes [provider]  hydrOXYzine (ATARAX/VISTARIL) 10 MG tablet TAKE 1 TABLET BY MOUTH TWICE DAILY AS NEEDED FOR ITCHING 12/10/14  Yes [provider]  Insulin Glargine (LANTUS SOLOSTAR) 100 UNIT/ML Solostar Pen Inject 32 Units into the skin daily.    Yes [provider]  lisinopril (PRINIVIL,ZESTRIL) 40 MG tablet Take 1 tablet (40 mg total) by mouth 2 (two) times daily. Patient taking differently: Take 20 mg by mouth daily.  11/17/14  Yes Charlynne Cousins, MD  loratadine (CLARITIN) 10 MG tablet Take 10 mg by mouth daily with lunch.   Yes [provider]  LUMIGAN 0.01 % SOLN Place 1 drop into both eyes at bedtime. 08/03/16  Yes [provider]  metoprolol succinate (TOPROL-XL) 25 MG 24 hr tablet Take 25 mg by mouth daily. 02/18/14  Yes [provider]  OVER THE COUNTER MEDICATION Apply 1 application topically  daily. Over the counter cream to prevent rash on buttocks   Yes [provider]  pantoprazole (PROTONIX) 40 MG tablet  05/04/15  Yes [provider]  phenytoin (DILANTIN) 100 MG ER capsule Take 300 mg by mouth at bedtime.   Yes [provider]  Polyethyl Glycol-Propyl Glycol (SYSTANE ULTRA) 0.4-0.3 % SOLN Place 1 drop into both eyes 3 (three) times daily as needed (dry eyes).   Yes [provider]  polyethylene glycol powder (GLYCOLAX/MIRALAX) powder Take 17 g by mouth daily as needed.  05/18/14  Yes [provider]  senna (SENOKOT) 8.6 MG TABS Take 1 tablet by mouth daily.   Yes [provider]  Triamcinolone Acetonide (NASACORT AQ NA) Place 1 spray into both nostrils daily.   Yes [provider]    Family History Family History  Problem Relation Age of Onset  . Cancer Father        had uncertain type of cancer  . Cancer Brother        Prostate Cancer  . Cancer Brother        Prostate Cancer  . Diabetes Other      Social History Social History  Substance Use Topics  . Smoking status: Never Smoker  . Smokeless tobacco: Never Used  . Alcohol use No     Allergies   Percocet [oxycodone-acetaminophen]   Review of Systems Review of Systems  All other systems reviewed and are negative.    Physical Exam Updated Vital Signs BP (!) 175/74 (BP Location: Left Arm)   Pulse (!) 111   Temp (!) 100.4 F (38 C) (Oral)   Resp 20   Ht 5\' 10"  (1.778 m)   Wt 77.1 kg (170 lb)   SpO2 90%   BMI 24.39 kg/m   Physical Exam  Constitutional: He appears well-developed and well-nourished.  HENT:  Head: Normocephalic and atraumatic.  Neck:  C-spine nontender  Cardiovascular: Regular rhythm.   No murmur heard. Tachycardic  Pulmonary/Chest: Effort normal and breath sounds normal. No respiratory distress.  Abdominal: Soft. There is no tenderness. There is no rebound and no guarding.  Musculoskeletal: He exhibits no tenderness.  2+ pitting edema to bilateral lower extremities  Neurological: He is alert.  Oriented to place, disoriented to time. Mildly confused about recent events. Generalized weakness.  Skin: Skin is warm and dry.  Psychiatric: He has a normal mood and affect. His behavior is normal.  Nursing note and vitals reviewed.    ED Treatments / Results  Labs (all labs ordered are listed, but only abnormal results are displayed) Labs Reviewed  COMPREHENSIVE METABOLIC PANEL - Abnormal; Notable for the following:       Result Value   CO2 20 (*)    BUN 36 (*)    Creatinine, Ser 1.89 (*)    Albumin 3.1 (*)    GFR calc non Af Amer 30 (*)    GFR calc Af Amer 34 (*)    All other components within normal limits  CBC WITH DIFFERENTIAL/PLATELET - Abnormal; Notable for the following:    RBC 3.08 (*)    Hemoglobin 10.2 (*)    HCT 30.6 (*)    All other components within normal limits  URINALYSIS, ROUTINE W REFLEX MICROSCOPIC - Abnormal; Notable for the following:    Hgb urine dipstick  SMALL (*)    Protein, ur 100 (*)    Bacteria, UA RARE (*)    Squamous Epithelial / LPF 0-5 (*)    All  other components within normal limits  PROTIME-INR - Abnormal; Notable for the following:    Prothrombin Time 15.7 (*)    All other components within normal limits  I-STAT CG4 LACTIC ACID, ED - Abnormal; Notable for the following:    Lactic Acid, Venous 2.11 (*)    All other components within normal limits  I-STAT CHEM 8, ED - Abnormal; Notable for the following:    BUN 37 (*)    Creatinine, Ser 1.80 (*)    Calcium, Ion 1.11 (*)    Hemoglobin 11.2 (*)    HCT 33.0 (*)    All other components within normal limits  CULTURE, BLOOD (ROUTINE X 2)  CULTURE, BLOOD (ROUTINE X 2)  URINE CULTURE  CULTURE, EXPECTORATED SPUTUM-ASSESSMENT  LACTIC ACID, PLASMA  LACTIC ACID, PLASMA  BASIC METABOLIC PANEL  CBC  I-STAT TROPOININ, ED  CBG MONITORING, ED  I-STAT CG4 LACTIC ACID, ED    EKG  EKG Interpretation  Date/Time:  Saturday October 30 2016 09:30:17 EDT Ventricular Rate:  115 PR Interval:    QRS Duration: 79 QT Interval:  325 QTC Calculation: 450 R Axis:   20 Text Interpretation:  Sinus tachycardia LVH by voltage Confirmed by Hazle Coca [phone]) on 10/30/2016 10:30:34 AM       Radiology Ct Head Wo Contrast  Result Date: 10/30/2016 CLINICAL DATA:  [place] and hit his head this morning. Altered mental status. EXAM: CT HEAD WITHOUT CONTRAST CT CERVICAL SPINE WITHOUT CONTRAST TECHNIQUE: Multidetector CT imaging of the head and cervical spine was performed following the standard protocol without intravenous contrast. Multiplanar CT image reconstructions of the cervical spine were also generated. COMPARISON:  Head CT and brain MR dated 11/09/2014 and head CT and cervical spine CT dated 04/11/2014. FINDINGS: CT HEAD FINDINGS Brain: Diffusely enlarged ventricles and subarachnoid spaces. Patchy white matter low density in both cerebral hemispheres. No intracranial hemorrhage, mass lesion or CT  evidence of acute infarction. Vascular: No hyperdense vessel or unexpected calcification. Skull: Normal. Negative for fracture or focal lesion. Sinuses/Orbits: Mild to moderate mucosal thickening involving all of the paranasal sinuses. There is also a small amount of fluid in the left maxillary sinus. Unremarkable orbits. Other: None. CT CERVICAL SPINE FINDINGS Alignment: Normal. Skull base and vertebrae: No acute fracture. No primary bone lesion or focal pathologic process. Soft tissues and spinal canal: No prevertebral fluid or swelling. No visible canal hematoma. Disc levels:  Extensive multilevel degenerative changes. Upper chest: Bilateral calcified pleural plaques. Other: Bilateral carotid artery calcifications. IMPRESSION: 1. No skull fracture or intracranial hemorrhage. 2. No cervical spine fracture or subluxation. 3. Stable cerebral and cerebellar atrophy and minimal chronic small vessel white matter ischemic changes in both cerebral hemispheres. 4. Extensive multilevel cervical spine degenerative changes. 5. Chronic pansinusitis with no acute component in the left maxillary sinus. 6. Bilateral carotid artery atheromatous calcifications. 7. 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 Electronically Signed   By: Claudie Revering M.D.   On: 10/30/2016 10:57   Ct Cervical Spine Wo Contrast  Result Date: 10/30/2016 CLINICAL DATA:  [place] and hit his head this morning. Altered mental status. EXAM: CT HEAD WITHOUT CONTRAST CT CERVICAL SPINE WITHOUT CONTRAST TECHNIQUE: Multidetector CT imaging of the head and cervical spine was performed following the standard protocol without intravenous contrast. Multiplanar CT image reconstructions of the cervical spine were also generated. COMPARISON:  Head CT and brain MR dated 11/09/2014 and head CT and cervical spine CT dated 04/11/2014. FINDINGS: CT HEAD FINDINGS Brain: Diffusely  enlarged ventricles and subarachnoid spaces. Patchy white  matter low density in both cerebral hemispheres. No intracranial hemorrhage, mass lesion or CT evidence of acute infarction. Vascular: No hyperdense vessel or unexpected calcification. Skull: Normal. Negative for fracture or focal lesion. Sinuses/Orbits: Mild to moderate mucosal thickening involving all of the paranasal sinuses. There is also a small amount of fluid in the left maxillary sinus. Unremarkable orbits. Other: None. CT CERVICAL SPINE FINDINGS Alignment: Normal. Skull base and vertebrae: No acute fracture. No primary bone lesion or focal pathologic process. Soft tissues and spinal canal: No prevertebral fluid or swelling. No visible canal hematoma. Disc levels:  Extensive multilevel degenerative changes. Upper chest: Bilateral calcified pleural plaques. Other: Bilateral carotid artery calcifications. IMPRESSION: 1. No skull fracture or intracranial hemorrhage. 2. No cervical spine fracture or subluxation. 3. Stable cerebral and cerebellar atrophy and minimal chronic small vessel white matter ischemic changes in both cerebral hemispheres. 4. Extensive multilevel cervical spine degenerative changes. 5. Chronic pansinusitis with no acute component in the left maxillary sinus. 6. Bilateral carotid artery atheromatous calcifications. 7. 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 Electronically Signed   By: Claudie Revering M.D.   On: 10/30/2016 10:57   Dg Chest Port 1 View  Result Date: 10/30/2016 CLINICAL DATA:  Fever.  Fell this morning. EXAM: PORTABLE CHEST 1 VIEW COMPARISON:  10/02/2016. FINDINGS: Interval patchy airspace opacity in the right mid lung zone. Clear left lung. The cardiac silhouette remains borderline enlarged. Diffuse osteopenia. IMPRESSION: Interval right mid lung zone pneumonia. Electronically Signed   By: Claudie Revering M.D.   On: 10/30/2016 09:53    Procedures Procedures (including critical care time)  Medications Ordered in ED Medications   vancomycin (VANCOCIN) IVPB 750 mg/150 ml premix (not administered)  hydrALAZINE (APRESOLINE) injection 10 mg (not administered)  guaiFENesin (MUCINEX) 12 hr tablet 600 mg (600 mg Oral Given 10/30/16 1454)  ipratropium-albuterol (DUONEB) 0.5-2.5 (3) MG/3ML nebulizer solution 3 mL (3 mLs Nebulization Given 10/30/16 1456)  albuterol (PROVENTIL) (2.5 MG/3ML) 0.083% nebulizer solution 2.5 mg (not administered)  donepezil (ARICEPT) tablet 10 mg (not administered)  cloNIDine (CATAPRES) tablet 0.1 mg (not administered)  latanoprost (XALATAN) 0.005 % ophthalmic solution 1 drop (not administered)  fluticasone (FLONASE) 50 MCG/ACT nasal spray 1 spray (1 spray Each Nare Given 10/30/16 1654)  pantoprazole (PROTONIX) EC tablet 40 mg (40 mg Oral Given 10/30/16 1454)  hydrALAZINE (APRESOLINE) tablet 75 mg (75 mg Oral Given 10/30/16 1454)  acetaminophen (TYLENOL) tablet 500 mg (500 mg Oral Given 10/30/16 1454)  atorvastatin (LIPITOR) tablet 20 mg (20 mg Oral Given 10/30/16 1654)  furosemide (LASIX) tablet 40 mg (not administered)  lisinopril (PRINIVIL,ZESTRIL) tablet 20 mg (not administered)  metoprolol succinate (TOPROL-XL) 24 hr tablet 25 mg (not administered)  phenytoin (DILANTIN) ER capsule 300 mg (not administered)  0.9 %  sodium chloride infusion ( Intravenous New Bag/Given 10/30/16 1444)  acetaminophen (TYLENOL) tablet 650 mg (not administered)    Or  acetaminophen (TYLENOL) suppository 650 mg (not administered)  ondansetron (ZOFRAN) tablet 4 mg (not administered)    Or  ondansetron (ZOFRAN) injection 4 mg (not administered)  ceFEPIme (MAXIPIME) 1 g in dextrose 5 % 50 mL IVPB (not administered)  polyvinyl alcohol (LIQUIFILM TEARS) 1.4 % ophthalmic solution 1 drop (not administered)  insulin glargine (LANTUS) injection 20 Units (20 Units Subcutaneous Given 10/30/16 1653)  polyethylene glycol (MIRALAX / GLYCOLAX) packet 17 g (not administered)  LORazepam (ATIVAN) injection 1 mg (not administered)  sodium  chloride 0.9 % bolus 1,000  mL (0 mLs Intravenous Stopped 10/30/16 1213)  vancomycin (VANCOCIN) 1,500 mg in sodium chloride 0.9 % 500 mL IVPB (0 mg Intravenous Stopped 10/30/16 1246)  ceFEPIme (MAXIPIME) 1 g in dextrose 5 % 50 mL IVPB (0 g Intravenous Stopped 10/30/16 1146)     Initial Impression / Assessment and Plan / ED Course  I have reviewed the triage vital signs and the nursing notes.  Pertinent labs & imaging results that were available during my care of the patient were reviewed by me and considered in my medical decision making (see chart for details).     Patient here for evaluation of injuries following a fall. Patient ill-appearing on initial evaluation and febrile. He is started on IV fluids, IV antibiotics for presumed sepsis. CT scan of head and neck demonstrate no acute intracranial abnormalities. Chest x-ray is concerning for pneumonia,  will treat. Hospitalist consulted for admission for further treatment. Patient updated findings studies recommendation for admission and he is in agreement with plan.  Final Clinical Impressions(s) / ED Diagnoses   Final diagnoses:  Sepsis, due to unspecified organism ([place])  HCAP (healthcare-associated pneumonia)  Fall, initial encounter    New Prescriptions Current Discharge Medication List       Quintella Reichert, MD 10/30/16 1758

## 2016-10-30 NOTE — H&P (Signed)
[facility] History and Physical  Eric [name] OJJ:[id number] DOB: 1924/02/02 DOA: 10/30/2016  Referring physician:  PCP: [name], MD   Chief Complaint: SOB  HPI: Eric [name] is a 83 y.o. male  with past medical history of CK D, diabetes, gastroparesis, hypertension, liver disorder and recent hospitalization for UTI presents to the emergency room chief complaint of shortness of breath and cough. Patient states he began have a congested cough just prior to leaving the nursing home after rehabilitation. Patient states the cough has gotten worse. Patient fell this morning. No loss of consciousness. Brought to the emergency room for evaluation. Patient was found to be febrile by rescue squad. evaluation.   Review of Systems:  As per HPI otherwise 10 point review of systems negative.    Past Medical History:  Diagnosis Date  . Adult failure to thrive   . Altered mental status   . ANEMIA-NOS 02/17/2007  . Anxiety   . BACK PAIN, LUMBAR   . BENIGN PROSTATIC HYPERTROPHY   . Bilateral leg edema   . CAP (community acquired pneumonia)   . CKD (chronic kidney disease)   . DEPRESSION 12/25/2008  . Diabetes mellitus   . DIABETES MELLITUS, TYPE I, CONTROLLED, WITH RETINOPATHY   . Encephalopathy, metabolic   . Gastroparesis   . GERD 02/17/2007  . Hyperlipidemia   . HYPERTENSION 02/17/2007  . Hyponatremia   . LIVER DISORDER   . OSTEOARTHRITIS 03/04/2010  . Pneumonia   . PSA, INCREASED   . Stroke ([place])   . VITAMIN B12 DEFICIENCY    Past Surgical History:  Procedure Laterality Date  . LEFT HEART CATHETERIZATION WITH CORONARY ANGIOGRAM N/A 04/23/2011   Procedure: LEFT HEART CATHETERIZATION WITH CORONARY ANGIOGRAM;  Surgeon: Peter M Martinique, MD;  Location: [facility] CATH LAB;  Service: Cardiovascular;  Laterality: N/A;  . TRANSURETHRAL RESECTION OF PROSTATE     Social History:  reports that he has never smoked. He has never used smokeless tobacco. He reports that he does not drink  alcohol or use drugs.  Allergies  Allergen Reactions  . Percocet [Oxycodone-Acetaminophen] Rash    Family History  Problem Relation Age of Onset  . Cancer Father        had uncertain type of cancer  . Cancer Brother        Prostate Cancer  . Cancer Brother        Prostate Cancer  . Diabetes Other      Prior to Admission medications   Medication Sig Start Date End Date Taking? Authorizing Provider  ACCU-CHEK AVIVA PLUS test strip  05/01/14   [provider]  acetaminophen (TYLENOL) 500 MG tablet Take 500 mg by mouth 2 (two) times daily. Reported on 06/19/2015    [provider]  amLODipine (NORVASC) 10 MG tablet Take 10 mg by mouth daily.  05/26/16   [provider]  atorvastatin (LIPITOR) 20 MG tablet Take 1 tablet (20 mg total) by mouth daily at 6 PM. 11/13/14   Charlynne Cousins, MD  B-D UF III MINI PEN NEEDLES 31G X 5 MM MISC  06/20/14   [provider]  calcitRIOL (ROCALTROL) 0.25 MCG capsule Take 0.25 mcg by mouth daily.  12/26/11   [provider]  cholecalciferol (VITAMIN D) 1000 UNITS tablet Take 2,000 Units by mouth daily.     [provider]  cloNIDine (CATAPRES) 0.1 MG tablet Take 0.1 mg by mouth 2 (two) times daily as needed.  05/03/16   [provider]  docusate sodium (COLACE) 100 MG capsule Take 100 mg by mouth daily.    [provider]  donepezil (ARICEPT) 10 MG tablet Take 1 tablet (10 mg total) by mouth at bedtime. 06/22/16   Dennie Bible, NP  fluticasone [facility]) 50 MCG/ACT nasal spray Place 1 spray into both nostrils 2 (two) times daily. 10/04/16   Doreatha Lew, MD  furosemide (LASIX) 20 MG tablet Take 1 tablet (20 mg total) by mouth 2 (two) times daily. Patient taking differently: Take 40 mg by mouth daily.  11/15/14   Charlynne Cousins, MD  hydrALAZINE (APRESOLINE) 25 MG tablet Take 75 mg by mouth 3 (three) times daily.  10/22/14   [provider]  hydrOXYzine  (ATARAX/VISTARIL) 10 MG tablet TAKE 1 TABLET BY MOUTH TWICE DAILY AS NEEDED FOR ITCHING 12/10/14   [provider]  Insulin Glargine (LANTUS SOLOSTAR) 100 UNIT/ML Solostar Pen Inject 32 Units into the skin daily.     [provider]  lisinopril (PRINIVIL,ZESTRIL) 40 MG tablet Take 1 tablet (40 mg total) by mouth 2 (two) times daily. Patient taking differently: Take 20 mg by mouth daily.  11/17/14   Charlynne Cousins, MD  loratadine (CLARITIN) 10 MG tablet Take 10 mg by mouth daily with lunch.    [provider]  LUMIGAN 0.01 % SOLN Place 1 drop into both eyes at bedtime. 08/03/16   [provider]  metoprolol succinate (TOPROL-XL) 25 MG 24 hr tablet Take 25 mg by mouth daily. 02/18/14   [provider]  OVER THE COUNTER MEDICATION Apply 1 application topically daily. Over the counter cream to prevent rash on buttocks    [provider]  pantoprazole (PROTONIX) 40 MG tablet  05/04/15   [provider]  phenytoin (DILANTIN) 100 MG ER capsule Take 300 mg by mouth at bedtime.    [provider]  Polyethyl Glycol-Propyl Glycol (SYSTANE ULTRA) 0.4-0.3 % SOLN Place 1 drop into both eyes 3 (three) times daily as needed (dry eyes).    [provider]  polyethylene glycol powder (GLYCOLAX/MIRALAX) powder Take 17 g by mouth daily as needed.  05/18/14   [provider]  senna (SENOKOT) 8.6 MG TABS Take 1 tablet by mouth daily.    [provider]  Triamcinolone Acetonide (NASACORT AQ NA) Place 1 spray into both nostrils daily.    [provider]  warfarin (COUMADIN) 2 MG tablet Take 3-6 mg by mouth See admin instructions. Take 6mg  by mouth once daily every day except for Tuesdays and Thursdays. Take 3mg  on Tuesdays and Thursdays 12/11/15   [provider]   Physical Exam: Vitals:   10/30/16 0933 10/30/16 0948 10/30/16 1011 10/30/16 1045  BP:  (!) 198/103 (!) 200/87 (!) 196/84  Pulse:  (!) 115 (!)  114 (!) 113  Resp:  (!) 21 (!) 21 (!) 22  Temp:  (!) 102.9 F (39.4 C)    TempSrc:  Rectal    SpO2:  95% 98% 96%  Weight: 77.1 kg (170 lb)     Height: 5\' 10"  (1.778 m)       Wt Readings from Last 3 Encounters:  10/30/16 77.1 kg (170 lb)  10/03/16 79.8 kg (175 lb 14.8 oz)  06/22/16 83.9 kg (185 lb)    General:  Appears calm and comfortable; Alert and oriented 3 Eyes:  PERRL, EOMI, normal lids, iris ENT:  grossly normal hearing, lips & tongue Neck:  no LAD, masses or thyromegaly Cardiovascular:  RRR, no m/r/g.  No LE edema.  Respiratory:  Mild basilar crackles on posterior exam bilaterally, no wheeze or rhonchi, tachypnea, no use of the sensory muscles Abdomen:  soft, ntnd Skin:  no rash or induration seen on limited exam Musculoskeletal:  grossly normal tone BUE/BLE Psychiatric:  grossly normal mood and affect, speech fluent and appropriate Neurologic:  CN 2-12 grossly intact, moves all extremities in coordinated fashion.          Labs on Admission:  Basic Metabolic Panel:  Recent Labs Lab 10/30/16 1000 10/30/16 1014  NA 136 139  K 3.7 3.8  CL 105 105  CO2 20*  --   GLUCOSE 89 88  BUN 36* 37*  CREATININE 1.89* 1.80*  CALCIUM 9.0  --    Liver Function Tests:  Recent Labs Lab 10/30/16 1000  AST 27  ALT 20  ALKPHOS 123  BILITOT 0.4  PROT 7.9  ALBUMIN 3.1*   No results for input(s): LIPASE, AMYLASE in the last 168 hours. No results for input(s): AMMONIA in the last 168 hours. CBC:  Recent Labs Lab 10/30/16 1000 10/30/16 1014  WBC 7.8  --   NEUTROABS 6.9  --   HGB 10.2* 11.2*  HCT 30.6* 33.0*  MCV 99.4  --   PLT 215  --    Cardiac Enzymes: No results for input(s): CKTOTAL, CKMB, CKMBINDEX, TROPONINI in the last 168 hours.  BNP (last 3 results) No results for input(s): BNP in the last 8760 hours.  ProBNP (last 3 results) No results for input(s): PROBNP in the last 8760 hours.   Serum creatinine: 1.8 mg/dL (H) 10/30/16 1014 Estimated  creatinine clearance: 28.2 mL/min (A)  CBG:  Recent Labs Lab 10/30/16 1001  GLUCAP 94    Radiological Exams on Admission: Ct Head Wo Contrast  Result Date: 10/30/2016 CLINICAL DATA:  [place] and hit his head this morning. Altered mental status. EXAM: CT HEAD WITHOUT CONTRAST CT CERVICAL SPINE WITHOUT CONTRAST TECHNIQUE: Multidetector CT imaging of the head and cervical spine was performed following the standard protocol without intravenous contrast. Multiplanar CT image reconstructions of the cervical spine were also generated. COMPARISON:  Head CT and brain MR dated 11/09/2014 and head CT and cervical spine CT dated 04/11/2014. FINDINGS: CT HEAD FINDINGS Brain: Diffusely enlarged ventricles and subarachnoid spaces. Patchy white matter low density in both cerebral hemispheres. No intracranial hemorrhage, mass lesion or CT evidence of acute infarction. Vascular: No hyperdense vessel or unexpected calcification. Skull: Normal. Negative for fracture or focal lesion. Sinuses/Orbits: Mild to moderate mucosal thickening involving all of the paranasal sinuses. There is also a small amount of fluid in the left maxillary sinus. Unremarkable orbits. Other: None. CT CERVICAL SPINE FINDINGS Alignment: Normal. Skull base and vertebrae: No acute fracture. No primary bone lesion or focal pathologic process. Soft tissues and spinal canal: No prevertebral fluid or swelling. No visible canal hematoma. Disc levels:  Extensive multilevel degenerative changes. Upper chest: Bilateral calcified pleural plaques. Other: Bilateral carotid artery calcifications. IMPRESSION: 1. No skull fracture or intracranial hemorrhage. 2. No cervical spine fracture or subluxation. 3. Stable cerebral and cerebellar atrophy and minimal chronic small vessel white matter ischemic changes in both cerebral hemispheres. 4. Extensive multilevel cervical spine degenerative changes. 5. Chronic pansinusitis with no acute component in the left maxillary  sinus. 6. Bilateral carotid artery atheromatous calcifications. 7. 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 Electronically Signed   By: Claudie Revering M.D.   On: 10/30/2016 10:57   Ct Cervical Spine Wo Contrast  Result Date: 10/30/2016 CLINICAL DATA:  [place] and hit his head this morning. Altered mental status. EXAM: CT HEAD WITHOUT CONTRAST CT CERVICAL SPINE WITHOUT CONTRAST TECHNIQUE: Multidetector CT imaging of the head and cervical spine was performed following the standard protocol without intravenous contrast. Multiplanar CT image reconstructions of the cervical spine were also generated. COMPARISON:  Head CT and brain MR dated 11/09/2014 and head CT and cervical spine CT dated 04/11/2014. FINDINGS: CT HEAD FINDINGS Brain: Diffusely enlarged ventricles and subarachnoid spaces. Patchy white matter low density in both cerebral hemispheres. No intracranial hemorrhage, mass lesion or CT evidence of acute infarction. Vascular: No hyperdense vessel or unexpected calcification. Skull: Normal. Negative for fracture or focal lesion. Sinuses/Orbits: Mild to moderate mucosal thickening involving all of the paranasal sinuses. There is also a small amount of fluid in the left maxillary sinus. Unremarkable orbits. Other: None. CT CERVICAL SPINE FINDINGS Alignment: Normal. Skull base and vertebrae: No acute fracture. No primary bone lesion or focal pathologic process. Soft tissues and spinal canal: No prevertebral fluid or swelling. No visible canal hematoma. Disc levels:  Extensive multilevel degenerative changes. Upper chest: Bilateral calcified pleural plaques. Other: Bilateral carotid artery calcifications. IMPRESSION: 1. No skull fracture or intracranial hemorrhage. 2. No cervical spine fracture or subluxation. 3. Stable cerebral and cerebellar atrophy and minimal chronic small vessel white matter ischemic changes in both cerebral hemispheres. 4. Extensive multilevel cervical spine  degenerative changes. 5. Chronic pansinusitis with no acute component in the left maxillary sinus. 6. Bilateral carotid artery atheromatous calcifications. 7. 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 Electronically Signed   By: Claudie Revering M.D.   On: 10/30/2016 10:57   Dg Chest Port 1 View  Result Date: 10/30/2016 CLINICAL DATA:  Fever.  Fell this morning. EXAM: PORTABLE CHEST 1 VIEW COMPARISON:  10/02/2016. FINDINGS: Interval patchy airspace opacity in the right mid lung zone. Clear left lung. The cardiac silhouette remains borderline enlarged. Diffuse osteopenia. IMPRESSION: Interval right mid lung zone pneumonia. Electronically Signed   By: Claudie Revering M.D.   On: 10/30/2016 09:53    EKG: Independently reviewed. Sinus tach, no STEMI  Assessment/Plan Principal Problem:   Sepsis due to pneumonia [facility]) Active Problems:   Hyperlipidemia   Essential hypertension   GERD   Benign prostatic hyperplasia   CKD (chronic kidney disease) stage 3, GFR 30-59 ml/min   DM2 (diabetes mellitus, type 2) (HCC)   History of DVT (deep vein thrombosis)   Sepsis 2/2 pna Patient hemodynamically stable Given vanc emergency room, will continue Given cefepime in the emergency room, will continue Urine culture pending Blood cultures 2 pending Patient given 1000 mL of fluid in the emergency room Lactic acid elevated, will trend Mucinex twice a day Incentive spirometer Flutter valve  Hypertension When necessary hydralazine 10 mg IV as needed for severe blood pressure Continue Norvasc and clonidine and hydralazine and Lopressor  Memory impairment Continue Aricept  CHF Continue Lasix, lisinopril  Hx of seizure Continue Dilantin Ativan when necessary seizures Seizure precautions  Head injury Neg scan of Head Neuro checks  Diabetes Sliding scale insulin AC Glargine cut from 32 units to 20 units while nothing by mouth  DVT hx Cont warfarin  Seasonal  allergies Continue Flonase  Glaucoma Continue Lumigan-->Xalatan  Hyperlipidemia Continue statin  Constipation When necessary MiraLAX  CKD Monitor Cr daily Cr at baseline,  ~1.8  Code Status: FULL  DVT Prophylaxis: coumadin Family Communication: dgtr Disposition Plan: Pending Improvement  Status: [place]  Aggie Moats, MD Family Medicine [facility] [email or web address] Password [email or web address]

## 2016-10-30 NOTE — ED Triage Notes (Signed)
Pt brought in by EMS due to having fall this morning. Pt did hit his head, but no LOC. Pt recently discharged from rehab facility and finished course of abx for UTI. Pt stopped coumadin 2-3 days ago, unsure of why pt stopped taking coumadin. Per EMS pt had temp of 100.7 and hypertensive. Pt a&ox2. Pt received 1000mg  tylenol and 4mg  zofran.

## 2016-10-30 NOTE — Progress Notes (Signed)
ED called and said they didn't draw new Lactic acid, which was due at 12:37 pm around time of transfer of patient to 5w. Consulted with phlebotomy and they cannot draw Lactic acid because not showing up on there end to draw labs. MD has now been notified twice. Will continue to monitor.  "5w01. Need stat Lactic acid on pt., lab cannot draw without order. pt here for sepsis, last lactic 2.11."

## 2016-10-30 NOTE — Progress Notes (Addendum)
Pharmacy Antibiotic Note  Eric [name] is a 83 y.o. male admitted on 10/30/2016 s/p fall.  Pharmacy has been consulted for vancomycin dosing for sepsis.  Patient was recently treated for an UTI.  He is febrile and his WBC is WNL.  He has CKD with calculated CrCL at 28 ml/min.  Plan: - Vanc 1500mg  IV x 1, then 750mg  IV Q24H - Monitor renal fxn, clinical progress, vanc trough as indicated - F/U with continuation of Gram negative coverage and ?Coumadin   Height: 5\' 10"  (177.8 cm) Weight: 170 lb (77.1 kg) IBW/kg (Calculated) : 73  Temp (24hrs), Avg:102.9 F (39.4 C), Min:102.9 F (39.4 C), Max:102.9 F (39.4 C)   Recent Labs Lab 10/30/16 1000 10/30/16 1014  WBC 7.8  --   CREATININE  --  1.80*  LATICACIDVEN  --  2.11*    Estimated Creatinine Clearance: 28.2 mL/min (A) (by C-G formula based on SCr of 1.8 mg/dL (H)).    Allergies  Allergen Reactions  . Percocet [Oxycodone-Acetaminophen] Rash     Vanc 7/14 >> Cefepime   Iley Deignan D. Mina Marble, PharmD, BCPS Pager:  [phone] 10/30/2016, 10:59 AM    ===================================   Addendum: - add cefepime   Plan: - Cefepime 1gm IV Q24H   Ivori Storr D. Mina Marble, PharmD, BCPS Pager:  [phone] 10/30/2016, 2:19 PM

## 2016-10-31 DIAGNOSIS — Z86718 Personal history of other venous thrombosis and embolism: Secondary | ICD-10-CM

## 2016-10-31 DIAGNOSIS — J189 Pneumonia, unspecified organism: Secondary | ICD-10-CM

## 2016-10-31 DIAGNOSIS — N183 Chronic kidney disease, stage 3 (moderate): Secondary | ICD-10-CM

## 2016-10-31 DIAGNOSIS — A419 Sepsis, unspecified organism: Principal | ICD-10-CM

## 2016-10-31 DIAGNOSIS — Z794 Long term (current) use of insulin: Secondary | ICD-10-CM

## 2016-10-31 DIAGNOSIS — W19XXXA Unspecified fall, initial encounter: Secondary | ICD-10-CM

## 2016-10-31 DIAGNOSIS — E1122 Type 2 diabetes mellitus with diabetic chronic kidney disease: Secondary | ICD-10-CM

## 2016-10-31 DIAGNOSIS — I10 Essential (primary) hypertension: Secondary | ICD-10-CM

## 2016-10-31 LAB — CBC
HCT: 26.4 % — ABNORMAL LOW (ref 39.0–52.0)
HEMOGLOBIN: 8.8 g/dL — AB (ref 13.0–17.0)
MCH: 33.2 pg (ref 26.0–34.0)
MCHC: 33.3 g/dL (ref 30.0–36.0)
MCV: 99.6 fL (ref 78.0–100.0)
PLATELETS: 177 10*3/uL (ref 150–400)
RBC: 2.65 MIL/uL — ABNORMAL LOW (ref 4.22–5.81)
RDW: 14.3 % (ref 11.5–15.5)
WBC: 8.5 10*3/uL (ref 4.0–10.5)

## 2016-10-31 LAB — BASIC METABOLIC PANEL
ANION GAP: 10 (ref 5–15)
BUN: 32 mg/dL — ABNORMAL HIGH (ref 6–20)
CALCIUM: 7.7 mg/dL — AB (ref 8.9–10.3)
CO2: 18 mmol/L — ABNORMAL LOW (ref 22–32)
Chloride: 109 mmol/L (ref 101–111)
Creatinine, Ser: 1.86 mg/dL — ABNORMAL HIGH (ref 0.61–1.24)
GFR calc Af Amer: 35 mL/min — ABNORMAL LOW (ref >60)
GFR, EST NON AFRICAN AMERICAN: 30 mL/min — AB (ref >60)
GLUCOSE: 124 mg/dL — AB (ref 65–99)
Potassium: 3.7 mmol/L (ref 3.5–5.1)
Sodium: 137 mmol/L (ref 135–145)

## 2016-10-31 LAB — URINE CULTURE: Culture: <10000 — AB

## 2016-10-31 LAB — GLUCOSE, CAPILLARY
GLUCOSE-CAPILLARY: 71 mg/dL (ref 65–99)
GLUCOSE-CAPILLARY: 138 mg/dL — AB (ref 65–99)
GLUCOSE-CAPILLARY: 188 mg/dL — AB (ref 65–99)
GLUCOSE-CAPILLARY: 197 mg/dL — AB (ref 65–99)

## 2016-10-31 LAB — MRSA PCR SCREENING: MRSA by PCR: POSITIVE — AB

## 2016-10-31 LAB — PHENYTOIN LEVEL, TOTAL: Phenytoin Lvl: 7.8 ug/mL — ABNORMAL LOW (ref 10.0–20.0)

## 2016-10-31 MED ORDER — PIPERACILLIN-TAZOBACTAM 3.375 G IVPB
3.3750 g | Freq: Three times a day (TID) | INTRAVENOUS | Status: DC
  Administered 2016-10-31 – 2016-11-03 (×9): 3.375 g via INTRAVENOUS
  Filled 2016-10-31 (×10): qty 50

## 2016-10-31 MED ORDER — INSULIN ASPART 100 UNIT/ML ~~LOC~~ SOLN
0.0000 [IU] | Freq: Three times a day (TID) | SUBCUTANEOUS | Status: DC
  Administered 2016-10-31: 2 [IU] via SUBCUTANEOUS
  Administered 2016-11-01: 5 [IU] via SUBCUTANEOUS
  Administered 2016-11-01: 2 [IU] via SUBCUTANEOUS
  Administered 2016-11-01: 1 [IU] via SUBCUTANEOUS
  Administered 2016-11-02: 3 [IU] via SUBCUTANEOUS
  Administered 2016-11-02: 5 [IU] via SUBCUTANEOUS
  Administered 2016-11-03: 2 [IU] via SUBCUTANEOUS
  Administered 2016-11-03: 3 [IU] via SUBCUTANEOUS

## 2016-10-31 MED ORDER — INSULIN ASPART 100 UNIT/ML ~~LOC~~ SOLN
0.0000 [IU] | Freq: Every day | SUBCUTANEOUS | Status: DC
  Administered 2016-11-01: 2 [IU] via SUBCUTANEOUS

## 2016-10-31 MED ORDER — ENOXAPARIN SODIUM 80 MG/0.8ML ~~LOC~~ SOLN
80.0000 mg | Freq: Every day | SUBCUTANEOUS | Status: DC
  Administered 2016-11-01 – 2016-11-03 (×3): 80 mg via SUBCUTANEOUS
  Filled 2016-10-31 (×3): qty 0.8

## 2016-10-31 MED ORDER — ENOXAPARIN SODIUM 80 MG/0.8ML ~~LOC~~ SOLN
1.0000 mg/kg | Freq: Every day | SUBCUTANEOUS | Status: DC
  Administered 2016-10-31: 75 mg via SUBCUTANEOUS
  Filled 2016-10-31: qty 0.8

## 2016-10-31 MED ORDER — IPRATROPIUM-ALBUTEROL 0.5-2.5 (3) MG/3ML IN SOLN
3.0000 mL | Freq: Two times a day (BID) | RESPIRATORY_TRACT | Status: DC
  Administered 2016-10-31 – 2016-11-01 (×2): 3 mL via RESPIRATORY_TRACT
  Filled 2016-10-31 (×2): qty 3

## 2016-10-31 NOTE — Progress Notes (Signed)
Pharmacy Antibiotic Note  Eric [name] is a 83 y.o. male admitted on 10/30/2016 with sepsis/HCAP.  Pharmacy has been consulted for zosyn dosing. Pharmacy is also consulted for vancomycin dosing and currently patient is on 750 mg IV q24h regimen. Patient was previously on cefepime and was discontinued today (7/15).  Plan: Zosyn 3.375g IV q8h (4 hour infusion).  Monitor renal fxn, clinical progress  Height: 5\' 10"  (177.8 cm) Weight: 170 lb (77.1 kg) IBW/kg (Calculated) : 73  Temp (24hrs), Avg:99.2 F (37.3 C), Min:98.7 F (37.1 C), Max:99.8 F (37.7 C)   Recent Labs Lab 10/30/16 1000 10/30/16 1014 10/30/16 1628 10/30/16 1925 10/31/16 0349  WBC 7.8  --   --   --  8.5  CREATININE 1.89* 1.80*  --   --  1.86*  LATICACIDVEN  --  2.11* 1.3 1.4  --     Estimated Creatinine Clearance: 27.3 mL/min (A) (by C-G formula based on SCr of 1.86 mg/dL (H)).    Allergies  Allergen Reactions  . Percocet [Oxycodone-Acetaminophen] Rash    Antimicrobials this admission: Vanc 7/14 >> Cefepime 7/14 >>7/15 Zosyn 7/15 >>  Microbiology results: 7/14 urine Cx: insignificant growth (final) 7/14: blood Cx x2: pending Thank you for allowing pharmacy to be a part of this patient's care.  Jalene Mullet, Pharm.D. PGY1 Pharmacy Resident 10/31/2016 2:48 PM Main Pharmacy: [phone]

## 2016-10-31 NOTE — Progress Notes (Signed)
PT Cancellation Note  Patient Details Name: VASH QUEZADA MRN: [id number] DOB: 1923/11/29   Cancelled Treatment:    Reason Eval/Treat Not Completed: Fatigue/lethargy limiting ability to participate.  Attempted to work with patient.  Unable to remain awake.  Will return later today for PT evaluation.   Despina Pole 10/31/2016, 12:30 PM Carita Pian Sanjuana Kava, [place] Pager [phone]

## 2016-10-31 NOTE — Progress Notes (Addendum)
PROGRESS NOTE    DOMNIC [name]   QAS:[id number]  DOB: 03/16/1924  DOA: 10/30/2016 PCP: [name], MD   Brief Narrative:  Eric [name] is a 83 year old male with past medical history of TIA, HTN, DM2, CAD, BPH, grade 1dCHF, dementia, DVT in the past (was on Coumadin for secondary stroke prevention per stroke team and DVT).  Presents via EMS for fall, hit his head but no LOC, found to be febrile to 102.9 suspected pneumonia. Started on Vanc and Cefepime.  Admitted for sepsis due to UTI from 6/16 - 6/18 and discharged on Ceftin to complete 7 days.   Subjective: Sleepy. Confused. Thinks it is September in the 1800s. Had a long talk with his sister. She is comfortable with the fact that he lives alone, has sitters during the day and is alone at night. I have told them that I do not feel he is safe. When asked about Coumadin, she states it was recently was stopped by his PCP because it has not helped his leg swelling go down. She is concerned that he has been sleeping more than usual for the past 4-5 days. He was doing well when be came home from SNF other than a mild cough which subsequently progressed. She has never noted choking on food or drink.   Assessment & Plan:   Severe Sepsis due to pneumonia  - RML- Vanc and Cefepime- check MRSA PCR, SLP eval, MBS, - thickened liquids for today - Lactic acid normalized  Active Problems: Fall/ generalized weakness  - due to above - PT eval- may need to go to SNF again  H/o TIA  - on Coumadin for stroke prophylaxis per multiple notes in computer from Neurology - this was stopped prior to admission - INR 1.24 - at this time, he is a fall risk and therefore, will cont to hold Coumadin- can start Lovenox while he can be monitored in house  H/o DVT in the past - Lovenox for DVT prophylaxis    Hyperlipidemia - Statin  Dementia - Aricept    Essential hypertension with grade 1dCHF - per sister, the Einar Gip has released him from his  office as he does not have heart failure - cont Hydralazine, Clonidine, Lisinopril, Toprol- hold Lasix     GERD - Protonix    Benign prostatic hyperplasia? - not on medications for this    CKD (chronic kidney disease) stage 3, GFR 30-59 ml/min - stable GFR around 30-40    DM2 (diabetes mellitus, type 2)  - Lantus, SSI      DVT prophylaxis: Heparin Code Status: Full code Family Communication: sister/ POA Eric [name] Disposition Plan: cont to follow on telemetry Consultants:    Procedures:    Antimicrobials:  Anti-infectives    Start     Dose/Rate Route Frequency Ordered Stop   10/31/16 1100  vancomycin (VANCOCIN) IVPB 750 mg/150 ml premix     750 mg 150 mL/hr over 60 Minutes Intravenous Every 24 hours 10/30/16 1057     10/31/16 1100  ceFEPIme (MAXIPIME) 1 g in dextrose 5 % 50 mL IVPB     1 g 100 mL/hr over 30 Minutes Intravenous Every 24 hours 10/30/16 1235     10/30/16 1100  ceFEPIme (MAXIPIME) 1 g in dextrose 5 % 50 mL IVPB     1 g 100 mL/hr over 30 Minutes Intravenous  Once 10/30/16 1004 10/30/16 1146   10/30/16 1000  piperacillin-tazobactam (ZOSYN) IVPB 3.375 g  Status:  Discontinued  3.375 g 100 mL/hr over 30 Minutes Intravenous  Once 10/30/16 0951 10/30/16 1005   10/30/16 1000  vancomycin (VANCOCIN) 1,500 mg in sodium chloride 0.9 % 500 mL IVPB     1,500 mg 250 mL/hr over 120 Minutes Intravenous  Once 10/30/16 0953 10/30/16 1246       Objective: Vitals:   10/30/16 2004 10/30/16 2031 10/30/16 2232 10/31/16 0335  BP: 133/60  (!) 152/67 135/68  Pulse: 98  (!) 101 89  Resp: 18  17 18   Temp: 99.8 F (37.7 C)  98.7 F (37.1 C) 99.3 F (37.4 C)  TempSrc: Oral     SpO2: 94% 95% 93% 93%  Weight:      Height:        Intake/Output Summary (Last 24 hours) at 10/31/16 0931 Last data filed at 10/31/16 0718  Gross per 24 hour  Intake          2476.67 ml  Output              500 ml  Net          1976.67 ml   Filed Weights   10/30/16 0933  Weight: 77.1  kg (170 lb)    Examination: General exam: Appears comfortable  HEENT: PERRLA, oral mucosa moist, no sclera icterus or thrush Respiratory system: crackles in RLL Respiratory effort normal. Cardiovascular system: S1 & S2 heard, RRR.  No murmurs  Gastrointestinal system: Abdomen soft, non-tender, nondistended. Normal bowel sound. No organomegaly Central nervous system: Alert and oriented only to self today. No focal neurological deficits. Extremities: No cyanosis, clubbing or edema Skin: No rashes or ulcers Psychiatry:  Mood & affect appropriate.     Data Reviewed: I have personally reviewed following labs and imaging studies  CBC:  Recent Labs Lab 10/30/16 1000 10/30/16 1014 10/31/16 0349  WBC 7.8  --  8.5  NEUTROABS 6.9  --   --   HGB 10.2* 11.2* 8.8*  HCT 30.6* 33.0* 26.4*  MCV 99.4  --  99.6  PLT 215  --  177   Basic Metabolic Panel:  Recent Labs Lab 10/30/16 1000 10/30/16 1014 10/31/16 0349  NA 136 139 137  K 3.7 3.8 3.7  CL 105 105 109  CO2 20*  --  18*  GLUCOSE 89 88 124*  BUN 36* 37* 32*  CREATININE 1.89* 1.80* 1.86*  CALCIUM 9.0  --  7.7*   GFR: Estimated Creatinine Clearance: 27.3 mL/min (A) (by C-G formula based on SCr of 1.86 mg/dL (H)). Liver Function Tests:  Recent Labs Lab 10/30/16 1000  AST 27  ALT 20  ALKPHOS 123  BILITOT 0.4  PROT 7.9  ALBUMIN 3.1*   No results for input(s): LIPASE, AMYLASE in the last 168 hours. No results for input(s): AMMONIA in the last 168 hours. Coagulation Profile:  Recent Labs Lab 10/30/16 1000  INR 1.24   Cardiac Enzymes: No results for input(s): CKTOTAL, CKMB, CKMBINDEX, TROPONINI in the last 168 hours. BNP (last 3 results) No results for input(s): PROBNP in the last 8760 hours. HbA1C: No results for input(s): HGBA1C in the last 72 hours. CBG:  Recent Labs Lab 10/30/16 1001 10/30/16 2001 10/31/16 0223 10/31/16 0334  GLUCAP 94 86 71 138*   Lipid Profile: No results for input(s): CHOL, HDL,  LDLCALC, TRIG, CHOLHDL, LDLDIRECT in the last 72 hours. Thyroid Function Tests: No results for input(s): TSH, T4TOTAL, FREET4, T3FREE, THYROIDAB in the last 72 hours. Anemia Panel: No results for input(s): VITAMINB12, FOLATE, FERRITIN, TIBC,  IRON, RETICCTPCT in the last 72 hours. Urine analysis:    Component Value Date/Time   COLORURINE YELLOW 10/30/2016 1050   APPEARANCEUR CLEAR 10/30/2016 1050   LABSPEC 1.014 10/30/2016 1050   PHURINE 5.0 10/30/2016 1050   GLUCOSEU NEGATIVE 10/30/2016 1050   GLUCOSEU 500 03/30/2011 1055   HGBUR SMALL (A) 10/30/2016 1050   HGBUR moderate 10/03/2008 1349   BILIRUBINUR NEGATIVE 10/30/2016 1050   KETONESUR NEGATIVE 10/30/2016 1050   PROTEINUR 100 (A) 10/30/2016 1050   UROBILINOGEN 0.2 11/09/2014 1717   NITRITE NEGATIVE 10/30/2016 1050   LEUKOCYTESUR NEGATIVE 10/30/2016 1050   Sepsis Labs: @LABRCNTIP (procalcitonin:4,lacticidven:4) )No results found for this or any previous visit (from the past 240 hour(s)).       Radiology Studies: Ct Head Wo Contrast  Result Date: 10/30/2016 CLINICAL DATA:  [place] and hit his head this morning. Altered mental status. EXAM: CT HEAD WITHOUT CONTRAST CT CERVICAL SPINE WITHOUT CONTRAST TECHNIQUE: Multidetector CT imaging of the head and cervical spine was performed following the standard protocol without intravenous contrast. Multiplanar CT image reconstructions of the cervical spine were also generated. COMPARISON:  Head CT and brain MR dated 11/09/2014 and head CT and cervical spine CT dated 04/11/2014. FINDINGS: CT HEAD FINDINGS Brain: Diffusely enlarged ventricles and subarachnoid spaces. Patchy white matter low density in both cerebral hemispheres. No intracranial hemorrhage, mass lesion or CT evidence of acute infarction. Vascular: No hyperdense vessel or unexpected calcification. Skull: Normal. Negative for fracture or focal lesion. Sinuses/Orbits: Mild to moderate mucosal thickening involving all of the paranasal  sinuses. There is also a small amount of fluid in the left maxillary sinus. Unremarkable orbits. Other: None. CT CERVICAL SPINE FINDINGS Alignment: Normal. Skull base and vertebrae: No acute fracture. No primary bone lesion or focal pathologic process. Soft tissues and spinal canal: No prevertebral fluid or swelling. No visible canal hematoma. Disc levels:  Extensive multilevel degenerative changes. Upper chest: Bilateral calcified pleural plaques. Other: Bilateral carotid artery calcifications. IMPRESSION: 1. No skull fracture or intracranial hemorrhage. 2. No cervical spine fracture or subluxation. 3. Stable cerebral and cerebellar atrophy and minimal chronic small vessel white matter ischemic changes in both cerebral hemispheres. 4. Extensive multilevel cervical spine degenerative changes. 5. Chronic pansinusitis with no acute component in the left maxillary sinus. 6. Bilateral carotid artery atheromatous calcifications. 7. 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 Electronically Signed   By: Claudie Revering M.D.   On: 10/30/2016 10:57   Ct Cervical Spine Wo Contrast  Result Date: 10/30/2016 CLINICAL DATA:  [place] and hit his head this morning. Altered mental status. EXAM: CT HEAD WITHOUT CONTRAST CT CERVICAL SPINE WITHOUT CONTRAST TECHNIQUE: Multidetector CT imaging of the head and cervical spine was performed following the standard protocol without intravenous contrast. Multiplanar CT image reconstructions of the cervical spine were also generated. COMPARISON:  Head CT and brain MR dated 11/09/2014 and head CT and cervical spine CT dated 04/11/2014. FINDINGS: CT HEAD FINDINGS Brain: Diffusely enlarged ventricles and subarachnoid spaces. Patchy white matter low density in both cerebral hemispheres. No intracranial hemorrhage, mass lesion or CT evidence of acute infarction. Vascular: No hyperdense vessel or unexpected calcification. Skull: Normal. Negative for fracture or  focal lesion. Sinuses/Orbits: Mild to moderate mucosal thickening involving all of the paranasal sinuses. There is also a small amount of fluid in the left maxillary sinus. Unremarkable orbits. Other: None. CT CERVICAL SPINE FINDINGS Alignment: Normal. Skull base and vertebrae: No acute fracture. No primary bone lesion or focal pathologic process. Soft tissues  and spinal canal: No prevertebral fluid or swelling. No visible canal hematoma. Disc levels:  Extensive multilevel degenerative changes. Upper chest: Bilateral calcified pleural plaques. Other: Bilateral carotid artery calcifications. IMPRESSION: 1. No skull fracture or intracranial hemorrhage. 2. No cervical spine fracture or subluxation. 3. Stable cerebral and cerebellar atrophy and minimal chronic small vessel white matter ischemic changes in both cerebral hemispheres. 4. Extensive multilevel cervical spine degenerative changes. 5. Chronic pansinusitis with no acute component in the left maxillary sinus. 6. Bilateral carotid artery atheromatous calcifications. 7. 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 Electronically Signed   By: Claudie Revering M.D.   On: 10/30/2016 10:57   Dg Chest Port 1 View  Result Date: 10/30/2016 CLINICAL DATA:  Fever.  Fell this morning. EXAM: PORTABLE CHEST 1 VIEW COMPARISON:  10/02/2016. FINDINGS: Interval patchy airspace opacity in the right mid lung zone. Clear left lung. The cardiac silhouette remains borderline enlarged. Diffuse osteopenia. IMPRESSION: Interval right mid lung zone pneumonia. Electronically Signed   By: Claudie Revering M.D.   On: 10/30/2016 09:53      Scheduled Meds: . acetaminophen  500 mg Oral BID  . atorvastatin  20 mg Oral q1800  . cloNIDine  0.1 mg Oral BID  . donepezil  10 mg Oral QHS  . fluticasone  1 spray Each Nare BID  . guaiFENesin  600 mg Oral BID  . heparin  5,000 Units Subcutaneous Q8H  . hydrALAZINE  75 mg Oral TID  . insulin glargine  20 Units  Subcutaneous Daily  . ipratropium-albuterol  3 mL Nebulization BID  . latanoprost  1 drop Both Eyes QHS  . lisinopril  20 mg Oral Daily  . metoprolol succinate  25 mg Oral Daily  . pantoprazole  40 mg Oral Daily  . phenytoin  300 mg Oral QHS   Continuous Infusions: . ceFEPime (MAXIPIME) IV    . vancomycin       LOS: 1 day    Time spent in minutes: 35    Debbe Odea, MD [facility] Pager: [email or web address] Password [facility] 10/31/2016, 9:31 AM

## 2016-10-31 NOTE — Progress Notes (Signed)
Pt is a type 1 diabetic per pt history. Pt CBG has slowly been dropping throughout this shift and has no orders for insulin coverage or CBG monitoring. Sofia,MD notified at 2030 with no return pages. Sofia,MD paged again at 0255 and MD returned page and did not place any new orders. Sofia,MD informed this RN to continue monitoring this patient's CBG and to just give him an amp of D50 if patient's CBG drops below 70. Will continue to monitor and treat per MD orders.

## 2016-10-31 NOTE — Progress Notes (Signed)
SLP Cancellation Note  Patient Details Name: LARON BOORMAN MRN: [id number] DOB: 09-27-1923   Cancelled treatment:       Reason Eval/Treat Not Completed: Other (comment) (will perform next date). Orders for MBS received. Will f/u next date for MBS.  Deneise Lever, [place], [place] Speech-Language Pathologist [place] 10/31/2016, 3:32 PM

## 2016-10-31 NOTE — Evaluation (Signed)
Physical Therapy Evaluation Patient Details Name: Eric [name] MRN: [id number] DOB: [birth year] Today's Date: 10/31/2016   History of Present Illness  Patient is a 83 yo male admitted 10/30/16 with AMS, SOB, cough, and following a fall.  Patient with sepsis due to pneumonia, HTN.    PMH:  CKD, DM, HTN, FTT, back pain, HLD, CVA, OA, dementia, CHF, seizure, recently discharged from SNF.  Clinical Impression  Patient presents with problems listed below.  Will benefit from acute PT to maximize functional mobility prior to discharge.  Patient was able to ambulate with RW pta.  Today, patient required max assist to attempt to stand unsuccessfully.  Patient not safe to be at home alone.  Recommend SNF at discharge.    Follow Up Recommendations SNF;Supervision/Assistance - 24 hour    Equipment Recommendations  None recommended by PT    Recommendations for Other Services       Precautions / Restrictions Precautions Precautions: Fall Precaution Comments: Fall pta Restrictions Weight Bearing Restrictions: No      Mobility  Bed Mobility               General bed mobility comments: Patient in chair  Transfers Overall transfer level: Needs assistance Equipment used: 1 person hand held assist Transfers: Sit to/from Stand Sit to Stand: Max assist         General transfer comment: Verbal cues for hand placement.  Patient required max assist to initiate transfer.  Unable to reach fully upright position with 2 attempts.  Cues to extend hips.  Once in chair, patient was able to bring trunk forward and scoot hips back into chair.  Ambulation/Gait             General Gait Details: NT  Stairs            Wheelchair Mobility    Modified Rankin (Stroke Patients Only)       Balance Overall balance assessment: Needs assistance;History of Falls         Standing balance support: Bilateral upper extremity supported Standing balance-Leahy Scale: Zero                               Pertinent Vitals/Pain Pain Assessment: No/denies pain    Home Living Family/patient expects to be discharged to:: Private residence Living Arrangements: Alone Available Help at Discharge: Personal care attendant;Available PRN/intermittently Type of Home: House Home Access: Stairs to enter Entrance Stairs-Rails: None Entrance Stairs-Number of Steps: 1 Home Layout: One level Home Equipment: Walker - 4 wheels;Shower seat;Walker - 2 wheels;Cane - single point;Bedside commode;Wheelchair - manual Additional Comments: Patient reports sister helps with transportation.  Unsure.    Prior Function Level of Independence: Needs assistance;Independent with assistive device(s)   Gait / Transfers Assistance Needed: amb with wheeled walker  ADL's / Homemaking Assistance Needed: Pt reports he has an aide who assists with dressing and bathing ADLs as well as IADLs including medication management and grocery shopping        Hand Dominance        Extremity/Trunk Assessment   Upper Extremity Assessment Upper Extremity Assessment: Generalized weakness    Lower Extremity Assessment Lower Extremity Assessment: Generalized weakness    Cervical / Trunk Assessment Cervical / Trunk Assessment: Kyphotic  Communication   Communication: No difficulties  Cognition Arousal/Alertness: Lethargic Behavior During Therapy: Flat affect Overall Cognitive Status: No family/caregiver present to determine baseline cognitive functioning  General Comments: Disoriented to time and situation.  Difficulty providing history information.  Very lethargic: in am unable to stay awake.  This pm, can keep patient awake during session, but falls asleep immediately after.      General Comments      Exercises     Assessment/Plan    PT Assessment Patient needs continued PT services  PT Problem List Decreased strength;Decreased activity  tolerance;Decreased balance;Decreased mobility;Decreased cognition;Decreased knowledge of use of DME;Cardiopulmonary status limiting activity       PT Treatment Interventions DME instruction;Gait training;Functional mobility training;Therapeutic activities;Therapeutic exercise;Balance training;Patient/family education    PT Goals (Current goals can be found in the Care Plan section)  Acute Rehab PT Goals Patient Stated Goal: To get stronger PT Goal Formulation: With patient Time For Goal Achievement: 11/14/16 Potential to Achieve Goals: Fair    Frequency Min 3X/week   Barriers to discharge Decreased caregiver support Patient lives alone with prn assist only.    Co-evaluation               AM-PAC PT "6 Clicks" Daily Activity  Outcome Measure Difficulty turning over in bed (including adjusting bedclothes, sheets and blankets)?: Total Difficulty moving from lying on back to sitting on the side of the bed? : Total Difficulty sitting down on and standing up from a chair with arms (e.g., wheelchair, bedside commode, etc,.)?: Total Help needed moving to and from a bed to chair (including a wheelchair)?: A Lot Help needed walking in hospital room?: Total Help needed climbing 3-5 steps with a railing? : Total 6 Click Score: 7    End of Session Equipment Utilized During Treatment: Gait belt Activity Tolerance: Patient limited by lethargy;Patient limited by fatigue Patient left: in chair;with call bell/phone within reach;with chair alarm set Nurse Communication: Mobility status (Recommend SNF) PT Visit Diagnosis: Unsteadiness on feet (R26.81);Difficulty in walking, not elsewhere classified (R26.2);Muscle weakness (generalized) (M62.81);History of falling (Z91.81);Adult, failure to thrive (R62.7)    Time: 4401-0272 PT Time Calculation (min) (ACUTE ONLY): 14 min   Charges:   PT Evaluation $PT Eval Moderate Complexity: 1 Procedure     PT G Codes:        Carita Pian. Sanjuana Kava,  [facility] Acute Rehab Services Pager [place] 10/31/2016, 5:10 PM

## 2016-11-01 ENCOUNTER — Inpatient Hospital Stay (HOSPITAL_COMMUNITY): Payer: Medicare Other

## 2016-11-01 LAB — CBC
HCT: 25.3 % — ABNORMAL LOW (ref 39.0–52.0)
Hemoglobin: 8.3 g/dL — ABNORMAL LOW (ref 13.0–17.0)
MCH: 32.2 pg (ref 26.0–34.0)
MCHC: 32.8 g/dL (ref 30.0–36.0)
MCV: 98.1 fL (ref 78.0–100.0)
PLATELETS: 176 10*3/uL (ref 150–400)
RBC: 2.58 MIL/uL — AB (ref 4.22–5.81)
RDW: 14.2 % (ref 11.5–15.5)
WBC: 9.5 10*3/uL (ref 4.0–10.5)

## 2016-11-01 LAB — BASIC METABOLIC PANEL
ANION GAP: 8 (ref 5–15)
Anion gap: 9 (ref 5–15)
BUN: 38 mg/dL — ABNORMAL HIGH (ref 6–20)
BUN: 36 mg/dL — ABNORMAL HIGH (ref 6–20)
CALCIUM: 7.7 mg/dL — AB (ref 8.9–10.3)
CALCIUM: 7.6 mg/dL — AB (ref 8.9–10.3)
CO2: 19 mmol/L — AB (ref 22–32)
CO2: 20 mmol/L — ABNORMAL LOW (ref 22–32)
CREATININE: 1.96 mg/dL — AB (ref 0.61–1.24)
Chloride: 106 mmol/L (ref 101–111)
Chloride: 104 mmol/L (ref 101–111)
Creatinine, Ser: 2.06 mg/dL — ABNORMAL HIGH (ref 0.61–1.24)
GFR calc Af Amer: 31 mL/min — ABNORMAL LOW (ref >60)
GFR, EST AFRICAN AMERICAN: 33 mL/min — AB (ref >60)
GFR, EST NON AFRICAN AMERICAN: 28 mL/min — AB (ref >60)
GFR, EST NON AFRICAN AMERICAN: 27 mL/min — AB (ref >60)
Glucose, Bld: 192 mg/dL — ABNORMAL HIGH (ref 65–99)
Glucose, Bld: 171 mg/dL — ABNORMAL HIGH (ref 65–99)
Potassium: 3.6 mmol/L (ref 3.5–5.1)
Potassium: 3.3 mmol/L — ABNORMAL LOW (ref 3.5–5.1)
SODIUM: 134 mmol/L — AB (ref 135–145)
SODIUM: 132 mmol/L — AB (ref 135–145)

## 2016-11-01 LAB — GLUCOSE, CAPILLARY
GLUCOSE-CAPILLARY: 181 mg/dL — AB (ref 65–99)
GLUCOSE-CAPILLARY: 292 mg/dL — AB (ref 65–99)
GLUCOSE-CAPILLARY: 135 mg/dL — AB (ref 65–99)
Glucose-Capillary: 203 mg/dL — ABNORMAL HIGH (ref 65–99)

## 2016-11-01 MED ORDER — SODIUM BICARBONATE 650 MG PO TABS
650.0000 mg | ORAL_TABLET | Freq: Three times a day (TID) | ORAL | Status: DC
  Administered 2016-11-01: 650 mg via ORAL
  Filled 2016-11-01: qty 1

## 2016-11-01 MED ORDER — ALBUTEROL SULFATE (2.5 MG/3ML) 0.083% IN NEBU: 2.5000 mg | INHALATION_SOLUTION | Freq: Four times a day (QID) | RESPIRATORY_TRACT | Status: DC | PRN

## 2016-11-01 MED ORDER — SODIUM BICARBONATE 650 MG PO TABS
650.0000 mg | ORAL_TABLET | Freq: Three times a day (TID) | ORAL | Status: DC
  Administered 2016-11-01 – 2016-11-03 (×7): 650 mg via ORAL
  Filled 2016-11-01 (×7): qty 1

## 2016-11-01 MED ORDER — SODIUM CHLORIDE 0.9 % IV BOLUS (SEPSIS)
250.0000 mL | Freq: Once | INTRAVENOUS | Status: AC
Start: 2016-11-01 — End: 2016-11-01
  Administered 2016-11-01: 250 mL via INTRAVENOUS

## 2016-11-01 NOTE — Progress Notes (Signed)
Modified Barium Swallow Progress Note  Patient Details  Name: Eric [name] MRN: [id number] Date of Birth: 03-16-1924  Today's Date: 11/01/2016  Modified Barium Swallow completed.  Full report located under Chart Review in the Imaging Section.  Brief recommendations include the following:  Clinical Impression  Pt demonstrated mild oral and pharygneal dysphagia. Premature spill to valleculae with thin due to decreased oral cohesion and delayed solid bolus transfer. Mild motor based deficits with mild vallecular and pyriform sinsus residue without pt awareness, cleared with verbal cues for second swallow. Recommend Dys 3 texture, thin liquids, pills whole in applesauce, straws allowed, swallow 2 times after every 2-3 bites/sips to clear residue. Further education and implementation of strategies needed with ST.   Swallow Evaluation Recommendations       SLP Diet Recommendations: Dysphagia 3 (Mech soft) solids;Thin liquid   Liquid Administration via: Cup;Straw   Medication Administration: Whole meds with puree   Supervision: Patient able to self feed;Intermittent supervision to cue for compensatory strategies   Compensations: Slow rate;Small sips/bites;Multiple dry swallows after each bite/sip   Postural Changes: Seated upright at 90 degrees;Remain semi-upright after after feeds/meals (Comment)   Oral Care Recommendations: Oral care BID        Houston Siren 11/01/2016,11:04 AM   Orbie Pyo Colvin Caroli.Ed [employer] [phone]

## 2016-11-01 NOTE — Plan of Care (Signed)
Problem: Safety: Goal: Ability to remain free from injury will improve Outcome: Progressing Pt will be free from falls and injuries during this hospitalization.  Problem: Skin Integrity: Goal: Risk for impaired skin integrity will decrease Outcome: Progressing Pt will be turned q 2 hrs to prevent pressure injuries.  Problem: Respiratory: Goal: Ability to maintain a clear airway will improve Outcome: Progressing Pt will be remain sitting in an upright position while eating to prevent aspiration.

## 2016-11-01 NOTE — Progress Notes (Signed)
CSW received consult regarding PT recommendation of SNF at discharge.  Patient's sister (POA) is refusing SNF. He was recently at [facility] from 6/18 and used 22 days. Patient's sister reported that patient was about to start home health therapy before this hospital admission and has 3 aides that assist him at home when she is not there. RNCM aware.  CSW signing off.   Eric [name] LCSWA [phone]

## 2016-11-01 NOTE — Care Management Note (Signed)
Case Management Note  Patient Details  Name: Eric [name] MRN: [id number] Date of Birth: 02-16-1924  Subjective/Objective:           Admitted 10/30/16 with AMS, SOB, cough, and following a fall.  Patient with sepsis due to pneumonia, HTN. PMH:  CKD, DM, HTN, FTT, back pain, HLD, CVA, OA, dementia, CHF, seizure, recently discharged from SNF.  PTA  pt received home health services provided by [facility] (RN,PT,OT), and has PCS (nurse aide) qd, 8am-8pm,which is  privately paid. CM   Eric [name] (Sister) Eric [name] ([phone]      Action/Plan: Per  PT's recommendation : SNF. However, per pt's sister @ d/c the plan is to return to home with home health services and PCS services, declines SNF placement . CM will continue  following for disposition needs.  Expected Discharge Date:                  Expected Discharge Plan:    In-House Referral:  Clinical Social Work  Discharge planning Services  CM Consult  Post Acute Care Choice:    Choice offered to:   patient  DME Arranged:    DME Agency:     HH Arranged:   PT,OT,RN [place]:   [facility], active with Agency PTA  Status of Service:  Completed, signed off  If discussed at [place] of Stay Meetings, dates discussed:    Additional Comments:  Sharin Mons, RN 11/01/2016, 10:57 AM

## 2016-11-01 NOTE — Progress Notes (Signed)
PROGRESS NOTE    Eric [name]   UMP:[id number]  DOB: 1923/07/23  DOA: 10/30/2016 PCP: [name], MD   Brief Narrative:  Eric [name] is a 83 year old male with past medical history of TIA, HTN, DM2, CAD, BPH, grade 1dCHF, dementia, DVT in the past (was on Coumadin for secondary stroke prevention per stroke team and DVT).  Presents via EMS for fall, hit his head but no LOC, found to be febrile to 102.9 suspected pneumonia. Started on Vanc and Cefepime.  Admitted for sepsis due to UTI from 6/16 - 6/18 and discharged on Ceftin to complete 7 days.   Subjective: Mild cough noted. Patient does not feel concerned. No dyspnea or chest pain. He has no other complaints today.  Assessment & Plan:   Severe Sepsis due to pneumonia  - RML - Vanc and Cefepime->> Vanc/ Zosyn on 7/15 - MRSA PCR + - 7/16- MBS>> mild oral and pharyngeal dysphagia- D 3 thin liquids, pills whole in applesauce, straws allowed, swallow 2 times after every 2-3 bites/sips to clear residue. - Lactic acid normalized - respiratory status stable, not hypoxic - cont Vanc Zosyn - will convert to Doxy and Augmentin on d/c   Active Problems: Fall/ generalized weakness  - due to above - PT eval- recommending SNF again  H/o TIA  - on Coumadin for stroke prophylaxis per multiple notes in computer from Neurology - this was stopped prior to admission - INR 1.24 - at this time, he is a fall risk and therefore, will cont to hold Coumadin- can start Lovenox while he can be monitored in house  H/o DVT in the past - Lovenox     Hyperlipidemia - Statin  Dementia - Aricept    Essential hypertension with grade 1dCHF - per sister, Dr Einar Gip has released him from his office as he does not have heart failure - cont Hydralazine, Clonidine, Lisinopril, Toprol - hold Lasix     GERD - Protonix    Benign prostatic hyperplasia? - not on medications for this    CKD (chronic kidney disease) stage 3, GFR 30-59 ml/min -  baseline GFR around 30-40  Acidotic - lactic acid normalizes - start Bicarb tabs    DM2 (diabetes mellitus, type 2)  - Lantus, SSI      DVT prophylaxis: Heparin Code Status: Full code Family Communication: sister/ POA [place] Disposition Plan:  Possible SNF- case manager following to see if insurance will cover Consultants:    Procedures:    Antimicrobials:  Anti-infectives    Start     Dose/Rate Route Frequency Ordered Stop   10/31/16 1500  piperacillin-tazobactam (ZOSYN) IVPB 3.375 g     3.375 g 12.5 mL/hr over 240 Minutes Intravenous Every 8 hours 10/31/16 1457     10/31/16 1100  vancomycin (VANCOCIN) IVPB 750 mg/150 ml premix     750 mg 150 mL/hr over 60 Minutes Intravenous Every 24 hours 10/30/16 1057     10/31/16 1100  ceFEPIme (MAXIPIME) 1 g in dextrose 5 % 50 mL IVPB  Status:  Discontinued     1 g 100 mL/hr over 30 Minutes Intravenous Every 24 hours 10/30/16 1235 10/31/16 1330   10/30/16 1100  ceFEPIme (MAXIPIME) 1 g in dextrose 5 % 50 mL IVPB     1 g 100 mL/hr over 30 Minutes Intravenous  Once 10/30/16 1004 10/30/16 1146   10/30/16 1000  piperacillin-tazobactam (ZOSYN) IVPB 3.375 g  Status:  Discontinued     3.375 g 100 mL/hr  over 30 Minutes Intravenous  Once 10/30/16 0951 10/30/16 1005   10/30/16 1000  vancomycin (VANCOCIN) 1,500 mg in sodium chloride 0.9 % 500 mL IVPB     1,500 mg 250 mL/hr over 120 Minutes Intravenous  Once 10/30/16 0953 10/30/16 1246       Objective: Vitals:   10/31/16 2300 11/01/16 0503 11/01/16 0731 11/01/16 1046  BP:  112/65  (!) 146/59  Pulse:  95  97  Resp:  20  18  Temp: 99 F (37.2 C) 99.8 F (37.7 C)  98.4 F (36.9 C)  TempSrc: Oral Oral  Oral  SpO2:  93% 92% 92%  Weight:      Height:        Intake/Output Summary (Last 24 hours) at 11/01/16 1207 Last data filed at 11/01/16 5170  Gross per 24 hour  Intake              250 ml  Output                0 ml  Net              250 ml   Filed Weights   10/30/16 0933    Weight: 77.1 kg (170 lb)    Examination: General exam: Appears comfortable  HEENT: PERRLA, oral mucosa moist, no sclera icterus or thrush Respiratory system:   Respiratory effort normal. Cardiovascular system: S1 & S2 heard, RRR.  No murmurs  Gastrointestinal system: Abdomen soft, non-tender, nondistended. Normal bowel sound. No organomegaly Central nervous system: Alert and oriented to self and place today- not date or time. No focal neurological deficits. Extremities: No cyanosis, clubbing or edema Skin: No rashes or ulcers Psychiatry:  Mood & affect appropriate.     Data Reviewed: I have personally reviewed following labs and imaging studies  CBC:  Recent Labs Lab 10/30/16 1000 10/30/16 1014 10/31/16 0349 11/01/16 0459  WBC 7.8  --  8.5 9.5  NEUTROABS 6.9  --   --   --   HGB 10.2* 11.2* 8.8* 8.3*  HCT 30.6* 33.0* 26.4* 25.3*  MCV 99.4  --  99.6 98.1  PLT 215  --  177 017   Basic Metabolic Panel:  Recent Labs Lab 10/30/16 1000 10/30/16 1014 10/31/16 0349 11/01/16 0459  NA 136 139 137 134*  K 3.7 3.8 3.7 3.6  CL 105 105 109 106  CO2 20*  --  18* 19*  GLUCOSE 89 88 124* 192*  BUN 36* 37* 32* 38*  CREATININE 1.89* 1.80* 1.86* 1.96*  CALCIUM 9.0  --  7.7* 7.7*   GFR: Estimated Creatinine Clearance: 25.9 mL/min (A) (by C-G formula based on SCr of 1.96 mg/dL (H)). Liver Function Tests:  Recent Labs Lab 10/30/16 1000  AST 27  ALT 20  ALKPHOS 123  BILITOT 0.4  PROT 7.9  ALBUMIN 3.1*   No results for input(s): LIPASE, AMYLASE in the last 168 hours. No results for input(s): AMMONIA in the last 168 hours. Coagulation Profile:  Recent Labs Lab 10/30/16 1000  INR 1.24   Cardiac Enzymes: No results for input(s): CKTOTAL, CKMB, CKMBINDEX, TROPONINI in the last 168 hours. BNP (last 3 results) No results for input(s): PROBNP in the last 8760 hours. HbA1C: No results for input(s): HGBA1C in the last 72 hours. CBG:  Recent Labs Lab 10/31/16 0223  10/31/16 0334 10/31/16 1722 10/31/16 2130 11/01/16 0817  GLUCAP 71 138* 188* 197* 181*   Lipid Profile: No results for input(s): CHOL, HDL, LDLCALC, TRIG, CHOLHDL,  LDLDIRECT in the last 72 hours. Thyroid Function Tests: No results for input(s): TSH, T4TOTAL, FREET4, T3FREE, THYROIDAB in the last 72 hours. Anemia Panel: No results for input(s): VITAMINB12, FOLATE, FERRITIN, TIBC, IRON, RETICCTPCT in the last 72 hours. Urine analysis:    Component Value Date/Time   COLORURINE YELLOW 10/30/2016 1050   APPEARANCEUR CLEAR 10/30/2016 1050   LABSPEC 1.014 10/30/2016 1050   PHURINE 5.0 10/30/2016 1050   GLUCOSEU NEGATIVE 10/30/2016 1050   GLUCOSEU 500 03/30/2011 1055   HGBUR SMALL (A) 10/30/2016 1050   HGBUR moderate 10/03/2008 1349   BILIRUBINUR NEGATIVE 10/30/2016 1050   KETONESUR NEGATIVE 10/30/2016 1050   PROTEINUR 100 (A) 10/30/2016 1050   UROBILINOGEN 0.2 11/09/2014 1717   NITRITE NEGATIVE 10/30/2016 1050   LEUKOCYTESUR NEGATIVE 10/30/2016 1050   Sepsis Labs: @LABRCNTIP (procalcitonin:4,lacticidven:4) ) Recent Results (from the past 240 hour(s))  Blood Culture (routine x 2)     Status: None (Preliminary result)   Collection Time: 10/30/16  9:40 AM  Result Value Ref Range Status   Specimen Description BLOOD LEFT WRIST  Final   Special Requests IN PEDIATRIC BOTTLE Blood Culture adequate volume  Final   Culture NO GROWTH 1 DAY  Final   Report Status PENDING  Incomplete  Blood Culture (routine x 2)     Status: None (Preliminary result)   Collection Time: 10/30/16  9:55 AM  Result Value Ref Range Status   Specimen Description BLOOD RIGHT FOREARM  Final   Special Requests   Final    BOTTLES DRAWN AEROBIC ONLY Blood Culture adequate volume   Culture NO GROWTH 1 DAY  Final   Report Status PENDING  Incomplete  Urine culture     Status: Abnormal   Collection Time: 10/30/16 10:50 AM  Result Value Ref Range Status   Specimen Description URINE, CLEAN CATCH  Final   Special  Requests NONE  Final   Culture <10,000 COLONIES/mL INSIGNIFICANT GROWTH (A)  Final   Report Status 10/31/2016 FINAL  Final  MRSA PCR Screening     Status: Abnormal   Collection Time: 10/31/16 11:12 AM  Result Value Ref Range Status   MRSA by PCR POSITIVE (A) NEGATIVE Final    Comment:        The GeneXpert MRSA Assay (FDA approved for NASAL specimens only), is one component of a comprehensive MRSA colonization surveillance program. It is not intended to diagnose MRSA infection nor to guide or monitor treatment for MRSA infections. CRITICAL RESULT CALLED TO, READ BACK BY AND VERIFIED WITH: RJudeth Horn, RN AT 0814 ON 10/31/16 BY C. JESSUP, MLT.          Radiology Studies: Dg Swallowing Func-speech Pathology  Result Date: 11/01/2016 Objective Swallowing Evaluation: Type of Study: Bedside Swallow Evaluation Patient Details Name: AZIAH BROSTROM MRN: [id number] Date of Birth: 1923/11/22 Today's Date: 11/01/2016 Time: SLP Start Time (ACUTE ONLY): 0957-SLP Stop Time (ACUTE ONLY): 1009 SLP Time Calculation (min) (ACUTE ONLY): 12 min Past Medical History: Past Medical History: Diagnosis Date . Adult failure to thrive  . Altered mental status  . ANEMIA-NOS 02/17/2007 . Anxiety  . BACK PAIN, LUMBAR  . BENIGN PROSTATIC HYPERTROPHY  . Bilateral leg edema  . CAP (community acquired pneumonia)  . CKD (chronic kidney disease)  . DEPRESSION 12/25/2008 . Diabetes mellitus  . DIABETES MELLITUS, TYPE I, CONTROLLED, WITH RETINOPATHY  . Encephalopathy, metabolic  . Gastroparesis  . GERD 02/17/2007 . Hyperlipidemia  . HYPERTENSION 02/17/2007 . Hyponatremia  . LIVER DISORDER  . OSTEOARTHRITIS 03/04/2010 .  Pneumonia  . PSA, INCREASED  . Stroke ([place])  . VITAMIN B12 DEFICIENCY  Past Surgical History: Past Surgical History: Procedure Laterality Date . LEFT HEART CATHETERIZATION WITH CORONARY ANGIOGRAM N/A 04/23/2011  Procedure: LEFT HEART CATHETERIZATION WITH CORONARY ANGIOGRAM;  Surgeon: Peter M Martinique, MD;  Location: [facility] CATH  LAB;  Service: Cardiovascular;  Laterality: N/A; . TRANSURETHRAL RESECTION OF PROSTATE   HPI: Konor P Troxleris a 83 y.o.malewith past medical history of CK D, diabetes, gastroparesis, hypertension, liver disorder and recent hospitalization for UTI presents to the emergency room chief complaint of shortness of breath and cough and a fall. CXR Interval right mid lung zone pneumonia. CT No skull fracture or intracranial hemorrhage. CT spine Extensive multilevel cervical spine degenerative changes No Data Recorded Assessment / Plan / Recommendation CHL IP CLINICAL IMPRESSIONS 11/01/2016 Clinical Impression Pt demonstrated mild oral and pharygneal dysphagia. Premature spill to valleculae with thin due to decreased oral cohesion and delayed solid bolus transfer. Mild motor based deficits with mild vallecular and pyriform sinsus residue without pt awareness, cleared with verbal cues for second swallow. Recommend Dys 3 texture, thin liquids, pills whole in applesauce, straws allowed, swallow 2 times after every 2-3 bites/sips to clear residue. Further education and implementation of strategies needed with ST. SLP Visit Diagnosis Dysphagia, oropharyngeal phase (R13.12) Attention and concentration deficit following -- Frontal lobe and executive function deficit following -- Impact on safety and function Mild aspiration risk   CHL IP TREATMENT RECOMMENDATION 11/01/2016 Treatment Recommendations Therapy as outlined in treatment plan below   Prognosis 11/01/2016 Prognosis for Safe Diet Advancement Good Barriers to Reach Goals -- Barriers/Prognosis Comment -- CHL IP DIET RECOMMENDATION 11/01/2016 SLP Diet Recommendations Dysphagia 3 (Mech soft) solids;Thin liquid Liquid Administration via Cup;Straw Medication Administration Whole meds with puree Compensations Slow rate;Small sips/bites;Multiple dry swallows after each bite/sip Postural Changes Seated upright at 90 degrees;Remain semi-upright after after feeds/meals (Comment)    CHL IP OTHER RECOMMENDATIONS 11/01/2016 Recommended Consults -- Oral Care Recommendations Oral care BID Other Recommendations --   CHL IP FOLLOW UP RECOMMENDATIONS 11/01/2016 Follow up Recommendations None   CHL IP FREQUENCY AND DURATION 11/01/2016 Speech Therapy Frequency (ACUTE ONLY) min 2x/week Treatment Duration 2 weeks      CHL IP ORAL PHASE 11/01/2016 Oral Phase Impaired Oral - Pudding Teaspoon -- Oral - Pudding Cup -- Oral - Honey Teaspoon -- Oral - Honey Cup -- Oral - Nectar Teaspoon -- Oral - Nectar Cup -- Oral - Nectar Straw -- Oral - Thin Teaspoon -- Oral - Thin Cup Premature spillage Oral - Thin Straw Premature spillage Oral - Puree -- Oral - Mech Soft -- Oral - Regular Delayed oral transit Oral - Multi-Consistency -- Oral - Pill -- Oral Phase - Comment --  CHL IP PHARYNGEAL PHASE 11/01/2016 Pharyngeal Phase Impaired Pharyngeal- Pudding Teaspoon -- Pharyngeal -- Pharyngeal- Pudding Cup -- Pharyngeal -- Pharyngeal- Honey Teaspoon -- Pharyngeal -- Pharyngeal- Honey Cup -- Pharyngeal -- Pharyngeal- Nectar Teaspoon -- Pharyngeal -- Pharyngeal- Nectar Cup -- Pharyngeal -- Pharyngeal- Nectar Straw -- Pharyngeal -- Pharyngeal- Thin Teaspoon -- Pharyngeal -- Pharyngeal- Thin Cup Pharyngeal residue - valleculae;Pharyngeal residue - pyriform;Reduced epiglottic inversion;Reduced laryngeal elevation Pharyngeal -- Pharyngeal- Thin Straw Pharyngeal residue - valleculae;Pharyngeal residue - pyriform;Reduced epiglottic inversion;Reduced laryngeal elevation Pharyngeal -- Pharyngeal- Puree -- Pharyngeal -- Pharyngeal- Mechanical Soft -- Pharyngeal -- Pharyngeal- Regular WFL Pharyngeal -- Pharyngeal- Multi-consistency -- Pharyngeal -- Pharyngeal- Pill -- Pharyngeal -- Pharyngeal Comment --  CHL IP CERVICAL ESOPHAGEAL PHASE 11/01/2016 Cervical Esophageal Phase WFL Pudding Teaspoon --  Pudding Cup -- Honey Teaspoon -- Honey Cup -- Nectar Teaspoon -- Nectar Cup -- Nectar Straw -- Thin Teaspoon -- Thin Cup -- Thin Straw -- Puree --  Mechanical Soft -- Regular -- Multi-consistency -- Pill -- Cervical Esophageal Comment -- CHL IP GO 11/11/2014 Functional Assessment Tool Used clinical judgment Functional Limitations Spoken language expressive Swallow Current Status [phone]) (None) Swallow Goal Status (E1007) (None) Swallow Discharge Status (H2197) (None) Motor Speech Current Status (J8832) (None) Motor Speech Goal Status (P4982) (None) Motor Speech Goal Status (M4158) (None) Spoken Language Comprehension Current Status (X0940) (None) Spoken Language Comprehension Goal Status (H6808) (None) Spoken Language Comprehension Discharge Status [phone]) (None) Spoken Language Expression Current Status (P5945) CH Spoken Language Expression Goal Status (O5929) [place] Spoken Language Expression Discharge Status (W4462) CH Attention Current Status (M6381) (None) Attention Goal Status (R7116) (None) Attention Discharge Status (F7903) (None) Memory Current Status (Y3338) (None) Memory Goal Status (V2919) (None) Memory Discharge Status (T6606) (None) Voice Current Status (Y0459) (None) Voice Goal Status (X7741) (None) Voice Discharge Status (S2395) (None) Other Speech-Language Pathology Functional Limitation Current Status (V2023) (None) Other Speech-Language Pathology Functional Limitation Goal Status (X4356) (None) Other Speech-Language Pathology Functional Limitation Discharge Status [phone]) (None) [place] Siren 11/01/2016, 11:03 AM  Orbie Pyo Colvin Caroli.Ed CCC-SLP Pager [phone]                Scheduled Meds: . acetaminophen  500 mg Oral BID  . atorvastatin  20 mg Oral q1800  . cloNIDine  0.1 mg Oral BID  . donepezil  10 mg Oral QHS  . enoxaparin (LOVENOX) injection  80 mg Subcutaneous Daily  . fluticasone  1 spray Each Nare BID  . guaiFENesin  600 mg Oral BID  . hydrALAZINE  75 mg Oral TID  . insulin aspart  0-5 Units Subcutaneous QHS  . insulin aspart  0-9 Units Subcutaneous TID WC  . insulin glargine  20 Units Subcutaneous Daily  .  latanoprost  1 drop Both Eyes QHS  . lisinopril  20 mg Oral Daily  . metoprolol succinate  25 mg Oral Daily  . pantoprazole  40 mg Oral Daily  . phenytoin  300 mg Oral QHS  . sodium bicarbonate  650 mg Oral TID   Continuous Infusions: . piperacillin-tazobactam (ZOSYN)  IV 3.375 g (11/01/16 0528)  . vancomycin 750 mg (11/01/16 1046)     LOS: 2 days    Time spent in minutes: 35    Debbe Odea, MD [facility] Pager: [email or web address] Password [facility] 11/01/2016, 12:07 PM

## 2016-11-02 DIAGNOSIS — J189 Pneumonia, unspecified organism: Secondary | ICD-10-CM

## 2016-11-02 DIAGNOSIS — A419 Sepsis, unspecified organism: Secondary | ICD-10-CM

## 2016-11-02 DIAGNOSIS — R6520 Severe sepsis without septic shock: Secondary | ICD-10-CM

## 2016-11-02 LAB — BASIC METABOLIC PANEL
Anion gap: 6 (ref 5–15)
BUN: 35 mg/dL — ABNORMAL HIGH (ref 6–20)
CHLORIDE: 107 mmol/L (ref 101–111)
CO2: 20 mmol/L — ABNORMAL LOW (ref 22–32)
CREATININE: 1.93 mg/dL — AB (ref 0.61–1.24)
Calcium: 7.6 mg/dL — ABNORMAL LOW (ref 8.9–10.3)
GFR, EST AFRICAN AMERICAN: 34 mL/min — AB (ref >60)
GFR, EST NON AFRICAN AMERICAN: 29 mL/min — AB (ref >60)
Glucose, Bld: 112 mg/dL — ABNORMAL HIGH (ref 65–99)
Potassium: 3.1 mmol/L — ABNORMAL LOW (ref 3.5–5.1)
SODIUM: 133 mmol/L — AB (ref 135–145)

## 2016-11-02 LAB — CBC
HCT: 23.9 % — ABNORMAL LOW (ref 39.0–52.0)
Hemoglobin: 8 g/dL — ABNORMAL LOW (ref 13.0–17.0)
MCH: 32.5 pg (ref 26.0–34.0)
MCHC: 33.5 g/dL (ref 30.0–36.0)
MCV: 97.2 fL (ref 78.0–100.0)
PLATELETS: 185 10*3/uL (ref 150–400)
RBC: 2.46 MIL/uL — AB (ref 4.22–5.81)
RDW: 14.4 % (ref 11.5–15.5)
WBC: 7.2 10*3/uL (ref 4.0–10.5)

## 2016-11-02 LAB — MAGNESIUM: MAGNESIUM: 1.8 mg/dL (ref 1.7–2.4)

## 2016-11-02 LAB — GLUCOSE, CAPILLARY
GLUCOSE-CAPILLARY: 100 mg/dL — AB (ref 65–99)
GLUCOSE-CAPILLARY: 222 mg/dL — AB (ref 65–99)
Glucose-Capillary: 264 mg/dL — ABNORMAL HIGH (ref 65–99)
Glucose-Capillary: 188 mg/dL — ABNORMAL HIGH (ref 65–99)

## 2016-11-02 MED ORDER — POTASSIUM CHLORIDE CRYS ER 20 MEQ PO TBCR
40.0000 meq | EXTENDED_RELEASE_TABLET | ORAL | Status: AC
  Administered 2016-11-02 (×2): 40 meq via ORAL
  Filled 2016-11-02 (×2): qty 2

## 2016-11-02 MED ORDER — DOXYCYCLINE MONOHYDRATE 100 MG PO TABS: 100.0000 mg | ORAL_TABLET | Freq: Two times a day (BID) | ORAL | 0 refills | Status: DC

## 2016-11-02 MED ORDER — AMOXICILLIN-POT CLAVULANATE 875-125 MG PO TABS: 1.0000 | ORAL_TABLET | Freq: Two times a day (BID) | ORAL | 0 refills | Status: AC

## 2016-11-02 NOTE — Progress Notes (Signed)
NCM received call from nurse stating pt's sister is appealing d/c 2/2 to unaware of pt  discharging today. CM called pt's sister Vicente Males to discuss concern and Vicente Males stated she was unaware of  d/c today and pt lives alone and she has to set up private duty care for pt prior to d/c. Vicente Males states she visited pt today @ bedside and it wasn't communicated pt would d/c today, awaiting call from MD. Vicente Males stated she's making arrangements to have private duty care in place to received pt by tomorrow noon. CM shared information with nurse,nurse to make MD aware. Resumption of home health services provided by [facility] are in place. Pt will need transportation to home via [facility] once discharged. Whitman Hero RN,BSN,CM

## 2016-11-02 NOTE — Discharge Instructions (Signed)
He needs 24 hr care at home and should not be left alone at all.   Please take all your medications with you for your next visit with your Primary MD. Please request your Primary MD to go over all hospital test results at the follow up. Please ask your Primary MD to get all Hospital records sent to his/her office.  If you experience worsening of your admission symptoms, develop shortness of breath, chest pain, suicidal or homicidal thoughts or a life threatening emergency, you must seek medical attention immediately by calling 911 or calling your MD.  Eric [name] must read the complete instructions/literature along with all the possible adverse reactions/side effects for all the medicines you take including new medications that have been prescribed to you. Take new medicines after you have completely understood and accpet all the possible adverse reactions/side effects.   Do not drive when taking pain medications or sedatives.    Do not take more than prescribed Pain, Sleep and Anxiety Medications  If you have smoked or chewed Tobacco in the last 2 yrs please stop. Stop any regular alcohol and or recreational drug use.  Wear Seat belts while driving.

## 2016-11-02 NOTE — Progress Notes (Signed)
[facility]  Called because his sister would like to appeal discharge as there is no staff in the home for him. I spoke with her 2 days ago in detail and explained that day that he would be discharged in 24-48 hrs and therefore she was aware that he would be discharged soon. Furthermore, she declined for him to go to SNF yesterday and wanted him to go home. See note by SW on 7/16. The patient has no medical reason to be kept in the hospital.  Debbe Odea, MD

## 2016-11-02 NOTE — Progress Notes (Signed)
Spoke with Mariann Laster in [place], she is going to look at all the information in approx 40 minutes after she has lunch.

## 2016-11-02 NOTE — Progress Notes (Signed)
Physical Therapy Treatment Patient Details Name: Eric [name] MRN: [id number] DOB: 14-Jul-1923 Today's Date: 11/02/2016    History of Present Illness Patient is a 83 yo male admitted 10/30/16 with AMS, SOB, cough, and following a fall.  Patient with sepsis due to pneumonia, HTN.    PMH:  CKD, DM, HTN, FTT, back pain, HLD, CVA, OA, dementia, CHF, seizure, recently discharged from SNF.    PT Comments    Pt is progressing toward goals. 2+ mod assist overall. Pt tolerated transfers and minimal ambulation with no c/o pain, but may have been limited by his urge to have a bowel movement. Pt would benefit from continued care to further progress toward goals and d/c to home health. Discharge plan remains appropriate.    Follow Up Recommendations  Home health PT;Supervision/Assistance - 24 hour     Equipment Recommendations  None recommended by PT    Recommendations for Other Services       Precautions / Restrictions Precautions Precautions: Fall Restrictions Weight Bearing Restrictions: No    Mobility  Bed Mobility Overal bed mobility: Needs Assistance Bed Mobility: Supine to Sit     Supine to sit: Min guard     General bed mobility comments: Hand on patient for safety during supine to sit.  Transfers Overall transfer level: Needs assistance Equipment used: Rolling walker (2 wheeled) Transfers: Sit to/from [facility] Sit to Stand: Mod assist;+2 physical assistance Stand pivot transfers: Mod assist;+2 physical assistance       General transfer comment: Verbal and visual cues for hand placement. Pt required 2+ mod assist for sit to stand from bed and stand pivot transfer from bed to commode.  Ambulation/Gait Ambulation/Gait assistance: Mod assist;+2 physical assistance Ambulation Distance (Feet): 3 Feet Assistive device: Rolling walker (2 wheeled) Gait Pattern/deviations: Step-to pattern;Decreased step length - right;Decreased step length - left;Narrow  base of support     General Gait Details: Pt required mod assist +2 for 32ft of ambulation from commode to chair   Stairs            Wheelchair Mobility    Modified Rankin (Stroke Patients Only) Modified Rankin (Stroke Patients Only) Pre-Morbid Rankin Score: Moderately severe disability Modified Rankin: Moderately severe disability     Balance Overall balance assessment: Needs assistance Sitting-balance support: Feet supported Sitting balance-Leahy Scale: Good Sitting balance - Comments: Pt able to sit unsupported at EOB.   Standing balance support: Bilateral upper extremity supported Standing balance-Leahy Scale: Poor Standing balance comment: Pt reliant on 2+ mod assist and RW to stand.                            Cognition Arousal/Alertness: Awake/alert Behavior During Therapy: Flat affect Overall Cognitive Status: No family/caregiver present to determine baseline cognitive functioning                                 General Comments: Pt unable to provide DOB.      Exercises      General Comments General comments (skin integrity, edema, etc.): Pt's catheter malfunctioned after transition from sit to stand, causing leakage on pt and on floor. Cleaned up before transferring pt to commode to attempt bowel movement. Pt stated he has been constipated. Pt tolerated transfers and minimal gait with no c/o pain or fatigue. Nurse notified of pt's request to have daughter notified of his upcoming departure from  hospital. Unable to confirm pt's claim that he will be discharged today.      Pertinent Vitals/Pain Pain Assessment: 0-10 Pain Score: 4  Pain Intervention(s): Repositioned;Monitored during session;Limited activity within patient's tolerance    Home Living                      Prior Function            PT Goals (current goals can now be found in the care plan section) Acute Rehab PT Goals PT Goal Formulation: With  patient Time For Goal Achievement: 11/14/16 Potential to Achieve Goals: Good Progress towards PT goals: Progressing toward goals    Frequency    Min 3X/week      PT Plan Current plan remains appropriate    Co-evaluation              AM-PAC PT "6 Clicks" Daily Activity  Outcome Measure  Difficulty turning over in bed (including adjusting bedclothes, sheets and blankets)?: Total Difficulty moving from lying on back to sitting on the side of the bed? : Total Difficulty sitting down on and standing up from a chair with arms (e.g., wheelchair, bedside commode, etc,.)?: Total Help needed moving to and from a bed to chair (including a wheelchair)?: A Lot Help needed walking in hospital room?: A Lot Help needed climbing 3-5 steps with a railing? : Total 6 Click Score: 8    End of Session Equipment Utilized During Treatment: Gait belt Activity Tolerance: Patient tolerated treatment well Patient left: in chair;with call bell/phone within reach;with chair alarm set Nurse Communication:  (see general comments) PT Visit Diagnosis: Unsteadiness on feet (R26.81);Difficulty in walking, not elsewhere classified (R26.2);Muscle weakness (generalized) (M62.81);History of falling (Z91.81)     Time: 1638-4536 PT Time Calculation (min) (ACUTE ONLY): 35 min  Charges:  $Therapeutic Activity: 23-37 mins                    G Codes:       Rayford Halsted, [place] Office #[phone]    Rayford Halsted 11/02/2016, 12:34 PM

## 2016-11-02 NOTE — Discharge Summary (Addendum)
Physician Discharge Summary  Eric [name] SPQ:[id number] DOB: April 20, 1923 DOA: 10/30/2016  PCP: [name], MD  Admit date: 10/30/2016 Discharge date: 11/02/2016  Admitted From: home Disposition:  home   Recommendations for Outpatient Follow-up:  1. Needs Bmet and CBC on 7/19[facility] will follow 2. Consider anemia panel at next office visit 3. Needs 24 hr care at home- spoke with his sister, POA, about this in detail.   Home Health:  ordered  Equipment/Devices:  walker    Discharge Condition:  stable   CODE STATUS:  Full code  Consultations:  none    Discharge Diagnoses:  Principal Problem:   Sepsis due to pneumonia [facility]) Active Problems:  Fall at home   Hyperlipidemia   Essential hypertension   GERD   Benign prostatic hyperplasia   CKD (chronic kidney disease) stage 3, GFR 30-59 ml/min   DM2 (diabetes mellitus, type 2) (HCC)   History of DVT (deep vein thrombosis)  Hypokalemia Hyponatermia Anemia of chronic disease Mild- mod dementia  Subjective: No shortness of breath. No cough but noted to have a mild cough when I was in the room. No chest pain, nausea or vomiting. No diarrhea or constipation.   Brief Summary: Eric [name] is a 83 year old male with past medical history of TIA, HTN, DM2, CAD, BPH, grade 1dCHF, dementia, DVT in the past (was on Coumadin for secondary stroke prevention per stroke team and DVT).  Presents via EMS for fall, hit his head but no LOC, found to be febrile to 102.9 suspected pneumonia. Started on Vanc and Cefepime.  Admitted for sepsis due to UTI from 6/16 - 6/18 and discharged on Ceftin to complete 7 days.    Hospital Course:  Severe Sepsis due to pneumonia - fever 102.9, HR > 100, LA 2.11 - RML pneumonia- ? aspiration - Vanc and Cefepime->> Vanc/ Zosyn on 7/15 - MRSA PCR + - 7/16- MBS>> mild oral and pharyngeal dysphagia- D 3 thin liquids, pills whole in applesauce, straws allowed, swallow 2 times after every 2-3 bites/sips to  clear residue. -fevers and tahcycardia resolved and Lactic acid has normalized - respiratory status stable- has not been hypoxic at all - WBC counts have been normal - will switch Vanc & Zosyn to Doxy and Augmentin today to complete a 1 wk course  Active Problems: Fall/ generalized weakness  - due to above - PT eval recommended SNF yesterday vs 24 hr supervision at home- the sister declined SNF for him as she has private caretakers - today PT recommends HHPT with 24 hr supervision  Hyponatremia - stop Lasix- euvolemic  H/o TIA  - on Coumadin for stroke prophylaxis per multiple notes in computer from Neurology - this was stopped by PCP prior to admission - INR 1.24 - at this time, he is a fall risk (presented with fall) and therefore, will cont to hold Coumadin-    H/o DVT in the past - Lovenox given in the hospital    Hyperlipidemia - Statin  Dementia - Aricept    Essential hypertension with grade 1dCHF - per sister, Dr Einar Gip has released him from his office as he does not have heart failure - cont Hydralazine, Clonidine, Lisinopril, Toprol - hold Lasix     GERD - Protonix    Benign prostatic hyperplasia? - not on medications for this    CKD (chronic kidney disease) stage 3, GFR 30-59 ml/min - baseline GFR 30-40- stable  Anemia - baseline Hb 10-11 - Hb now around 8 - he  received fluid boluses in the ER and then continuous fluids and this may be partly dilutional - likely anemia of chronic disease sumperimposed by anemia of acute illness - no blood loss noted in hospital     DM2 (diabetes mellitus, type 2)  - Lantus, SSI      Discharge Instructions  Discharge Instructions    Discharge diet:    Complete by:  As directed    To prevent aspiration take mechanical soft food (chopped) pills whole in applesauce, straws allowed, swallow 2 times after every 2-3 bites/sips to clear residue. Low sodium heart healthy diabetic diet   Increase activity  slowly    Complete by:  As directed      Allergies as of 11/02/2016      Reactions   Percocet [oxycodone-acetaminophen] Rash      Medication List    STOP taking these medications   furosemide 20 MG tablet Commonly known as:  LASIX     TAKE these medications   ACCU-CHEK AVIVA PLUS test strip Generic drug:  glucose blood   acetaminophen 500 MG tablet Commonly known as:  TYLENOL Take 500 mg by mouth 2 (two) times daily. Reported on 06/19/2015   amLODipine 10 MG tablet Commonly known as:  NORVASC Take 10 mg by mouth daily.   amoxicillin-clavulanate 875-125 MG tablet Commonly known as:  AUGMENTIN Take 1 tablet by mouth every 12 (twelve) hours.   atorvastatin 20 MG tablet Commonly known as:  LIPITOR Take 1 tablet (20 mg total) by mouth daily at 6 PM.   B-D UF III MINI PEN NEEDLES 31G X 5 MM Misc Generic drug:  Insulin Pen Needle   calcitRIOL 0.25 MCG capsule Commonly known as:  ROCALTROL Take 0.25 mcg by mouth daily.   cholecalciferol 1000 units tablet Commonly known as:  VITAMIN D Take 2,000 Units by mouth daily.   cloNIDine 0.1 MG tablet Commonly known as:  CATAPRES Take 0.1 mg by mouth 2 (two) times daily as needed.   docusate sodium 100 MG capsule Commonly known as:  COLACE Take 100 mg by mouth daily.   donepezil 10 MG tablet Commonly known as:  ARICEPT Take 1 tablet (10 mg total) by mouth at bedtime.   doxycycline 100 MG tablet Commonly known as:  ADOXA Take 1 tablet (100 mg total) by mouth 2 (two) times daily.   fluticasone 50 MCG/ACT nasal spray Commonly known as:  FLONASE Place 1 spray into both nostrils 2 (two) times daily.   hydrALAZINE 25 MG tablet Commonly known as:  APRESOLINE Take 75 mg by mouth 3 (three) times daily.   hydrOXYzine 10 MG tablet Commonly known as:  ATARAX/VISTARIL TAKE 1 TABLET BY MOUTH TWICE DAILY AS NEEDED FOR ITCHING   LANTUS SOLOSTAR 100 UNIT/ML Solostar Pen Generic drug:  Insulin Glargine Inject 32 Units into the  skin daily.   lisinopril 40 MG tablet Commonly known as:  PRINIVIL,ZESTRIL Take 1 tablet (40 mg total) by mouth 2 (two) times daily. What changed:  how much to take  when to take this   loratadine 10 MG tablet Commonly known as:  CLARITIN Take 10 mg by mouth daily with lunch.   LUMIGAN 0.01 % Soln Generic drug:  bimatoprost Place 1 drop into both eyes at bedtime.   metoprolol succinate 25 MG 24 hr tablet Commonly known as:  TOPROL-XL Take 25 mg by mouth daily.   NASACORT AQ NA Place 1 spray into both nostrils daily.   OVER THE COUNTER MEDICATION Apply 1  application topically daily. Over the counter cream to prevent rash on buttocks   pantoprazole 40 MG tablet Commonly known as:  PROTONIX   phenytoin 100 MG ER capsule Commonly known as:  DILANTIN Take 300 mg by mouth at bedtime.   polyethylene glycol powder powder Commonly known as:  GLYCOLAX/MIRALAX Take 17 g by mouth daily as needed.   senna 8.6 MG Tabs tablet Commonly known as:  SENOKOT Take 1 tablet by mouth daily.   SYSTANE ULTRA 0.4-0.3 % Soln Generic drug:  Polyethyl Glycol-Propyl Glycol Place 1 drop into both eyes 3 (three) times daily as needed (dry eyes).      Follow-up Information    [place] Follow up.   Specialty:  [place] Why:  resumption of home health services arranged , office will call and set up home visits Contact information: [place] [address] [phone]        [name], MD Follow up.   Specialty:  Internal Medicine Why:  f/u in 1 wk he needs a Bmet and CBC in 2 days by [facility] and then again at f/u visit Contact information: [address] [place] [address] [phone]          Allergies  Allergen Reactions  . Percocet [Oxycodone-Acetaminophen] Rash     Procedures/Studies:    Ct Head Wo Contrast  Result Date: 10/30/2016 CLINICAL DATA:  [place] and hit his head this morning. Altered  mental status. EXAM: CT HEAD WITHOUT CONTRAST CT CERVICAL SPINE WITHOUT CONTRAST TECHNIQUE: Multidetector CT imaging of the head and cervical spine was performed following the standard protocol without intravenous contrast. Multiplanar CT image reconstructions of the cervical spine were also generated. COMPARISON:  Head CT and brain MR dated 11/09/2014 and head CT and cervical spine CT dated 04/11/2014. FINDINGS: CT HEAD FINDINGS Brain: Diffusely enlarged ventricles and subarachnoid spaces. Patchy white matter low density in both cerebral hemispheres. No intracranial hemorrhage, mass lesion or CT evidence of acute infarction. Vascular: No hyperdense vessel or unexpected calcification. Skull: Normal. Negative for fracture or focal lesion. Sinuses/Orbits: Mild to moderate mucosal thickening involving all of the paranasal sinuses. There is also a small amount of fluid in the left maxillary sinus. Unremarkable orbits. Other: None. CT CERVICAL SPINE FINDINGS Alignment: Normal. Skull base and vertebrae: No acute fracture. No primary bone lesion or focal pathologic process. Soft tissues and spinal canal: No prevertebral fluid or swelling. No visible canal hematoma. Disc levels:  Extensive multilevel degenerative changes. Upper chest: Bilateral calcified pleural plaques. Other: Bilateral carotid artery calcifications. IMPRESSION: 1. No skull fracture or intracranial hemorrhage. 2. No cervical spine fracture or subluxation. 3. Stable cerebral and cerebellar atrophy and minimal chronic small vessel white matter ischemic changes in both cerebral hemispheres. 4. Extensive multilevel cervical spine degenerative changes. 5. Chronic pansinusitis with no acute component in the left maxillary sinus. 6. Bilateral carotid artery atheromatous calcifications. 7. 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 Electronically Signed   By: Claudie Revering M.D.   On: 10/30/2016 10:57   Ct Cervical Spine Wo  Contrast  Result Date: 10/30/2016 CLINICAL DATA:  [place] and hit his head this morning. Altered mental status. EXAM: CT HEAD WITHOUT CONTRAST CT CERVICAL SPINE WITHOUT CONTRAST TECHNIQUE: Multidetector CT imaging of the head and cervical spine was performed following the standard protocol without intravenous contrast. Multiplanar CT image reconstructions of the cervical spine were also generated. COMPARISON:  Head CT and brain MR dated 11/09/2014 and  head CT and cervical spine CT dated 04/11/2014. FINDINGS: CT HEAD FINDINGS Brain: Diffusely enlarged ventricles and subarachnoid spaces. Patchy white matter low density in both cerebral hemispheres. No intracranial hemorrhage, mass lesion or CT evidence of acute infarction. Vascular: No hyperdense vessel or unexpected calcification. Skull: Normal. Negative for fracture or focal lesion. Sinuses/Orbits: Mild to moderate mucosal thickening involving all of the paranasal sinuses. There is also a small amount of fluid in the left maxillary sinus. Unremarkable orbits. Other: None. CT CERVICAL SPINE FINDINGS Alignment: Normal. Skull base and vertebrae: No acute fracture. No primary bone lesion or focal pathologic process. Soft tissues and spinal canal: No prevertebral fluid or swelling. No visible canal hematoma. Disc levels:  Extensive multilevel degenerative changes. Upper chest: Bilateral calcified pleural plaques. Other: Bilateral carotid artery calcifications. IMPRESSION: 1. No skull fracture or intracranial hemorrhage. 2. No cervical spine fracture or subluxation. 3. Stable cerebral and cerebellar atrophy and minimal chronic small vessel white matter ischemic changes in both cerebral hemispheres. 4. Extensive multilevel cervical spine degenerative changes. 5. Chronic pansinusitis with no acute component in the left maxillary sinus. 6. Bilateral carotid artery atheromatous calcifications. 7. 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  Electronically Signed   By: Claudie Revering M.D.   On: 10/30/2016 10:57   Dg Chest Port 1 View  Result Date: 10/30/2016 CLINICAL DATA:  Fever.  Fell this morning. EXAM: PORTABLE CHEST 1 VIEW COMPARISON:  10/02/2016. FINDINGS: Interval patchy airspace opacity in the right mid lung zone. Clear left lung. The cardiac silhouette remains borderline enlarged. Diffuse osteopenia. IMPRESSION: Interval right mid lung zone pneumonia. Electronically Signed   By: Claudie Revering M.D.   On: 10/30/2016 09:53   Dg Swallowing Func-speech Pathology  Result Date: 11/01/2016 Objective Swallowing Evaluation: Type of Study: Bedside Swallow Evaluation Patient Details Name: ZIDANE RENNER MRN: [id number] Date of Birth: Apr 21, 1923 Today's Date: 11/01/2016 Time: SLP Start Time (ACUTE ONLY): 0957-SLP Stop Time (ACUTE ONLY): 1009 SLP Time Calculation (min) (ACUTE ONLY): 12 min Past Medical History: Past Medical History: Diagnosis Date . Adult failure to thrive  . Altered mental status  . ANEMIA-NOS 02/17/2007 . Anxiety  . BACK PAIN, LUMBAR  . BENIGN PROSTATIC HYPERTROPHY  . Bilateral leg edema  . CAP (community acquired pneumonia)  . CKD (chronic kidney disease)  . DEPRESSION 12/25/2008 . Diabetes mellitus  . DIABETES MELLITUS, TYPE I, CONTROLLED, WITH RETINOPATHY  . Encephalopathy, metabolic  . Gastroparesis  . GERD 02/17/2007 . Hyperlipidemia  . HYPERTENSION 02/17/2007 . Hyponatremia  . LIVER DISORDER  . OSTEOARTHRITIS 03/04/2010 . Pneumonia  . PSA, INCREASED  . Stroke ([place])  . VITAMIN B12 DEFICIENCY  Past Surgical History: Past Surgical History: Procedure Laterality Date . LEFT HEART CATHETERIZATION WITH CORONARY ANGIOGRAM N/A 04/23/2011  Procedure: LEFT HEART CATHETERIZATION WITH CORONARY ANGIOGRAM;  Surgeon: Peter M Martinique, MD;  Location: [facility] CATH LAB;  Service: Cardiovascular;  Laterality: N/A; . TRANSURETHRAL RESECTION OF PROSTATE   HPI: Derel P Troxleris a 83 y.o.malewith past medical history of CK D, diabetes, gastroparesis,  hypertension, liver disorder and recent hospitalization for UTI presents to the emergency room chief complaint of shortness of breath and cough and a fall. CXR Interval right mid lung zone pneumonia. CT No skull fracture or intracranial hemorrhage. CT spine Extensive multilevel cervical spine degenerative changes No Data Recorded Assessment / Plan / Recommendation CHL IP CLINICAL IMPRESSIONS 11/01/2016 Clinical Impression Pt demonstrated mild oral and pharygneal dysphagia. Premature spill to valleculae with thin due to decreased oral cohesion and  delayed solid bolus transfer. Mild motor based deficits with mild vallecular and pyriform sinsus residue without pt awareness, cleared with verbal cues for second swallow. Recommend Dys 3 texture, thin liquids, pills whole in applesauce, straws allowed, swallow 2 times after every 2-3 bites/sips to clear residue. Further education and implementation of strategies needed with ST. SLP Visit Diagnosis Dysphagia, oropharyngeal phase (R13.12) Attention and concentration deficit following -- Frontal lobe and executive function deficit following -- Impact on safety and function Mild aspiration risk   CHL IP TREATMENT RECOMMENDATION 11/01/2016 Treatment Recommendations Therapy as outlined in treatment plan below   Prognosis 11/01/2016 Prognosis for Safe Diet Advancement Good Barriers to Reach Goals -- Barriers/Prognosis Comment -- CHL IP DIET RECOMMENDATION 11/01/2016 SLP Diet Recommendations Dysphagia 3 (Mech soft) solids;Thin liquid Liquid Administration via Cup;Straw Medication Administration Whole meds with puree Compensations Slow rate;Small sips/bites;Multiple dry swallows after each bite/sip Postural Changes Seated upright at 90 degrees;Remain semi-upright after after feeds/meals (Comment)   CHL IP OTHER RECOMMENDATIONS 11/01/2016 Recommended Consults -- Oral Care Recommendations Oral care BID Other Recommendations --   CHL IP FOLLOW UP RECOMMENDATIONS 11/01/2016 Follow up  Recommendations None   CHL IP FREQUENCY AND DURATION 11/01/2016 Speech Therapy Frequency (ACUTE ONLY) min 2x/week Treatment Duration 2 weeks      CHL IP ORAL PHASE 11/01/2016 Oral Phase Impaired Oral - Pudding Teaspoon -- Oral - Pudding Cup -- Oral - Honey Teaspoon -- Oral - Honey Cup -- Oral - Nectar Teaspoon -- Oral - Nectar Cup -- Oral - Nectar Straw -- Oral - Thin Teaspoon -- Oral - Thin Cup Premature spillage Oral - Thin Straw Premature spillage Oral - Puree -- Oral - Mech Soft -- Oral - Regular Delayed oral transit Oral - Multi-Consistency -- Oral - Pill -- Oral Phase - Comment --  CHL IP PHARYNGEAL PHASE 11/01/2016 Pharyngeal Phase Impaired Pharyngeal- Pudding Teaspoon -- Pharyngeal -- Pharyngeal- Pudding Cup -- Pharyngeal -- Pharyngeal- Honey Teaspoon -- Pharyngeal -- Pharyngeal- Honey Cup -- Pharyngeal -- Pharyngeal- Nectar Teaspoon -- Pharyngeal -- Pharyngeal- Nectar Cup -- Pharyngeal -- Pharyngeal- Nectar Straw -- Pharyngeal -- Pharyngeal- Thin Teaspoon -- Pharyngeal -- Pharyngeal- Thin Cup Pharyngeal residue - valleculae;Pharyngeal residue - pyriform;Reduced epiglottic inversion;Reduced laryngeal elevation Pharyngeal -- Pharyngeal- Thin Straw Pharyngeal residue - valleculae;Pharyngeal residue - pyriform;Reduced epiglottic inversion;Reduced laryngeal elevation Pharyngeal -- Pharyngeal- Puree -- Pharyngeal -- Pharyngeal- Mechanical Soft -- Pharyngeal -- Pharyngeal- Regular WFL Pharyngeal -- Pharyngeal- Multi-consistency -- Pharyngeal -- Pharyngeal- Pill -- Pharyngeal -- Pharyngeal Comment --  CHL IP CERVICAL ESOPHAGEAL PHASE 11/01/2016 Cervical Esophageal Phase WFL Pudding Teaspoon -- Pudding Cup -- Honey Teaspoon -- Honey Cup -- Nectar Teaspoon -- Nectar Cup -- Nectar Straw -- Thin Teaspoon -- Thin Cup -- Thin Straw -- Puree -- Mechanical Soft -- Regular -- Multi-consistency -- Pill -- Cervical Esophageal Comment -- CHL IP GO 11/11/2014 Functional Assessment Tool Used clinical judgment Functional Limitations  Spoken language expressive Swallow Current Status [phone]) (None) Swallow Goal Status (P7948) (None) Swallow Discharge Status (A1655) (None) Motor Speech Current Status (V7482) (None) Motor Speech Goal Status (L0786) (None) Motor Speech Goal Status (L5449) (None) Spoken Language Comprehension Current Status (E0100) (None) Spoken Language Comprehension Goal Status (F1219) (None) Spoken Language Comprehension Discharge Status [phone]) (None) Spoken Language Expression Current Status (G5498) CH Spoken Language Expression Goal Status (Y6415) [place] Spoken Language Expression Discharge Status (A3094) CH Attention Current Status (M7680) (None) Attention Goal Status (S8110) (None) Attention Discharge Status (R1594) (None) Memory Current Status (V8592) (None) Memory Goal Status (T2446) (None) Memory Discharge  Status [phone]) (None) Voice Current Status [phone]) (None) Voice Goal Status (E9528) (None) Voice Discharge Status [phone]) (None) Other Speech-Language Pathology Functional Limitation Current Status (M0102) (None) Other Speech-Language Pathology Functional Limitation Goal Status (V2536) (None) Other Speech-Language Pathology Functional Limitation Discharge Status [phone]) (None) [place] Siren 11/01/2016, 11:03 AM  Orbie Pyo Colvin Caroli.Ed CCC-SLP Pager [phone]                 Discharge Exam: Vitals:   11/02/16 0544 11/02/16 1052  BP: (!) 161/66 (!) 134/57  Pulse: 89 85  Resp: 18   Temp: 98.6 F (37 C)    Vitals:   11/01/16 1522 11/01/16 2112 11/02/16 0544 11/02/16 1052  BP: (!) 113/54 (!) 172/75 (!) 161/66 (!) 134/57  Pulse: 76 98 89 85  Resp: 18  18   Temp: 97.9 F (36.6 C) 98.5 F (36.9 C) 98.6 F (37 C)   TempSrc: Oral Oral Oral   SpO2: 98% 90% 93% 95%  Weight:      Height:        General: Pt is alert, awake, not in acute distress Cardiovascular: RRR, S1/S2 +, no rubs, no gallops Respiratory: crackles in right mid lung field, no wheezing, no rhonchi Abdominal: Soft, NT, ND, bowel  sounds + Extremities: no edema, no cyanosis    The results of significant diagnostics from this hospitalization (including imaging, microbiology, ancillary and laboratory) are listed below for reference.     Microbiology: Recent Results (from the past 240 hour(s))  Blood Culture (routine x 2)     Status: None (Preliminary result)   Collection Time: 10/30/16  9:40 AM  Result Value Ref Range Status   Specimen Description BLOOD LEFT WRIST  Final   Special Requests IN PEDIATRIC BOTTLE Blood Culture adequate volume  Final   Culture NO GROWTH 2 DAYS  Final   Report Status PENDING  Incomplete  Blood Culture (routine x 2)     Status: None (Preliminary result)   Collection Time: 10/30/16  9:55 AM  Result Value Ref Range Status   Specimen Description BLOOD RIGHT FOREARM  Final   Special Requests   Final    BOTTLES DRAWN AEROBIC ONLY Blood Culture adequate volume   Culture NO GROWTH 2 DAYS  Final   Report Status PENDING  Incomplete  Urine culture     Status: Abnormal   Collection Time: 10/30/16 10:50 AM  Result Value Ref Range Status   Specimen Description URINE, CLEAN CATCH  Final   Special Requests NONE  Final   Culture <10,000 COLONIES/mL INSIGNIFICANT GROWTH (A)  Final   Report Status 10/31/2016 FINAL  Final  MRSA PCR Screening     Status: Abnormal   Collection Time: 10/31/16 11:12 AM  Result Value Ref Range Status   MRSA by PCR POSITIVE (A) NEGATIVE Final    Comment:        The GeneXpert MRSA Assay (FDA approved for NASAL specimens only), is one component of a comprehensive MRSA colonization surveillance program. It is not intended to diagnose MRSA infection nor to guide or monitor treatment for MRSA infections. CRITICAL RESULT CALLED TO, READ BACK BY AND VERIFIED WITH: RJudeth Horn, RN AT [phone] ON 10/31/16 BY C. JESSUP, MLT.      Labs: BNP (last 3 results) No results for input(s): BNP in the last 8760 hours. Basic Metabolic Panel:  Recent Labs Lab 10/30/16 1000  10/30/16 1014 10/31/16 0349 11/01/16 0459 11/01/16 1654 11/02/16 0347 11/02/16 0756  NA 136 139 137 134* 132*  133*  --   K 3.7 3.8 3.7 3.6 3.3* 3.1*  --   CL 105 105 109 106 104 107  --   CO2 20*  --  18* 19* 20* 20*  --   GLUCOSE 89 88 124* 192* 171* 112*  --   BUN 36* 37* 32* 38* 36* 35*  --   CREATININE 1.89* 1.80* 1.86* 1.96* 2.06* 1.93*  --   CALCIUM 9.0  --  7.7* 7.7* 7.6* 7.6*  --   MG  --   --   --   --   --   --  1.8   Liver Function Tests:  Recent Labs Lab 10/30/16 1000  AST 27  ALT 20  ALKPHOS 123  BILITOT 0.4  PROT 7.9  ALBUMIN 3.1*   No results for input(s): LIPASE, AMYLASE in the last 168 hours. No results for input(s): AMMONIA in the last 168 hours. CBC:  Recent Labs Lab 10/30/16 1000 10/30/16 1014 10/31/16 0349 11/01/16 0459 11/02/16 0347  WBC 7.8  --  8.5 9.5 7.2  NEUTROABS 6.9  --   --   --   --   HGB 10.2* 11.2* 8.8* 8.3* 8.0*  HCT 30.6* 33.0* 26.4* 25.3* 23.9*  MCV 99.4  --  99.6 98.1 97.2  PLT 215  --  177 176 185   Cardiac Enzymes: No results for input(s): CKTOTAL, CKMB, CKMBINDEX, TROPONINI in the last 168 hours. BNP: Invalid input(s): POCBNP CBG:  Recent Labs Lab 11/01/16 1226 11/01/16 1655 11/01/16 2110 11/02/16 0758 11/02/16 1149  GLUCAP 292* 135* 203* 100* 264*   D-Dimer No results for input(s): DDIMER in the last 72 hours. Hgb A1c No results for input(s): HGBA1C in the last 72 hours. Lipid Profile No results for input(s): CHOL, HDL, LDLCALC, TRIG, CHOLHDL, LDLDIRECT in the last 72 hours. Thyroid function studies No results for input(s): TSH, T4TOTAL, T3FREE, THYROIDAB in the last 72 hours.  Invalid input(s): FREET3 Anemia work up No results for input(s): VITAMINB12, FOLATE, FERRITIN, TIBC, IRON, RETICCTPCT in the last 72 hours. Urinalysis    Component Value Date/Time   COLORURINE YELLOW 10/30/2016 1050   APPEARANCEUR CLEAR 10/30/2016 1050   LABSPEC 1.014 10/30/2016 1050   PHURINE 5.0 10/30/2016 1050    GLUCOSEU NEGATIVE 10/30/2016 1050   GLUCOSEU 500 03/30/2011 1055   HGBUR SMALL (A) 10/30/2016 1050   HGBUR moderate 10/03/2008 1349   BILIRUBINUR NEGATIVE 10/30/2016 1050   KETONESUR NEGATIVE 10/30/2016 1050   PROTEINUR 100 (A) 10/30/2016 1050   UROBILINOGEN 0.2 11/09/2014 1717   NITRITE NEGATIVE 10/30/2016 1050   LEUKOCYTESUR NEGATIVE 10/30/2016 1050   Sepsis Labs Invalid input(s): PROCALCITONIN,  WBC,  LACTICIDVEN Microbiology Recent Results (from the past 240 hour(s))  Blood Culture (routine x 2)     Status: None (Preliminary result)   Collection Time: 10/30/16  9:40 AM  Result Value Ref Range Status   Specimen Description BLOOD LEFT WRIST  Final   Special Requests IN PEDIATRIC BOTTLE Blood Culture adequate volume  Final   Culture NO GROWTH 2 DAYS  Final   Report Status PENDING  Incomplete  Blood Culture (routine x 2)     Status: None (Preliminary result)   Collection Time: 10/30/16  9:55 AM  Result Value Ref Range Status   Specimen Description BLOOD RIGHT FOREARM  Final   Special Requests   Final    BOTTLES DRAWN AEROBIC ONLY Blood Culture adequate volume   Culture NO GROWTH 2 DAYS  Final   Report Status PENDING  Incomplete  Urine culture     Status: Abnormal   Collection Time: 10/30/16 10:50 AM  Result Value Ref Range Status   Specimen Description URINE, CLEAN CATCH  Final   Special Requests NONE  Final   Culture <10,000 COLONIES/mL INSIGNIFICANT GROWTH (A)  Final   Report Status 10/31/2016 FINAL  Final  MRSA PCR Screening     Status: Abnormal   Collection Time: 10/31/16 11:12 AM  Result Value Ref Range Status   MRSA by PCR POSITIVE (A) NEGATIVE Final    Comment:        The GeneXpert MRSA Assay (FDA approved for NASAL specimens only), is one component of a comprehensive MRSA colonization surveillance program. It is not intended to diagnose MRSA infection nor to guide or monitor treatment for MRSA infections. CRITICAL RESULT CALLED TO, READ BACK BY AND VERIFIED  WITH: RJudeth Horn, RN AT [phone] ON 10/31/16 BY C. JESSUP, MLT.      Time coordinating discharge: Over 30 minutes  SIGNED:   Debbe Odea, MD  [facility] 11/02/2016, 1:45 PM Pager   If 7PM-7AM, please contact night-coverage [email or web address] Password [email or web address]

## 2016-11-03 LAB — GLUCOSE, CAPILLARY
GLUCOSE-CAPILLARY: 154 mg/dL — AB (ref 65–99)
Glucose-Capillary: 218 mg/dL — ABNORMAL HIGH (ref 65–99)

## 2016-11-03 MED ORDER — POTASSIUM CHLORIDE CRYS ER 20 MEQ PO TBCR
40.0000 meq | EXTENDED_RELEASE_TABLET | Freq: Once | ORAL | Status: AC
  Administered 2016-11-03: 40 meq via ORAL
  Filled 2016-11-03: qty 2

## 2016-11-03 NOTE — Progress Notes (Addendum)
D/C summary completed 11/02/2016. Pt seen and examined at the bedside. Pt medically stable for discharge. Potassium supplemented prior to discharge. Please refer to d/c summary completed 7/17. No changes in medications since 11/02/16.  Leisa Lenz [facility] [phone]

## 2016-11-03 NOTE — Progress Notes (Signed)
  Speech Language Pathology Treatment: Dysphagia  Patient Details Name: Eric [name] MRN: [id number] DOB: 1923/06/17 Today's Date: 11/03/2016 Time: 7793-9030 SLP Time Calculation (min) (ACUTE ONLY): 11 min  Assessment / Plan / Recommendation Clinical Impression  Pt needs max cues to recall strategies. Sign designed for pt to see was on wall behind him therefore SLP moved to front wall although uncertain if pt will recall when and how frequent to perform second swallow. Immediate cough after consecutive straw sips thin with verbal reminders for decreased intake which pt was able to perform with several second delay. Mastication and transit appropriate with Dys 3 texture. Smaller sips appeared safe. Pt requires full supervision for reminders of precautions. Continue Dys 3, thin and brief ST follow up.    HPI HPI: Rishith Siddoway Troxleris a 83 y.o.malewith past medical history of CK D, diabetes, gastroparesis, hypertension, liver disorder and recent hospitalization for UTI presents to the emergency room chief complaint of shortness of breath and cough and a fall. CXR Interval right mid lung zone pneumonia. CT No skull fracture or intracranial hemorrhage. CT spine Extensive multilevel cervical spine degenerative changes      SLP Plan  Continue with current plan of care       Recommendations  Diet recommendations: Dysphagia 3 (mechanical soft);Thin liquid Liquids provided via: Cup;Straw Medication Administration: Whole meds with puree Supervision: Patient able to self feed;Intermittent supervision to cue for compensatory strategies Compensations: Slow rate;Small sips/bites;Multiple dry swallows after each bite/sip Postural Changes and/or Swallow Maneuvers: Seated upright 90 degrees                Oral Care Recommendations: Oral care BID Follow up Recommendations: None SLP Visit Diagnosis: Dysphagia, oropharyngeal phase (R13.12) Plan: Continue with current plan of care       GO                 Houston Siren 11/03/2016, 10:42 AM  Orbie Pyo Colvin Caroli.Ed [employer] [phone]

## 2016-11-04 LAB — CULTURE, BLOOD (ROUTINE X 2)
CULTURE: NO GROWTH
Culture: NO GROWTH
SPECIAL REQUESTS: ADEQUATE
SPECIAL REQUESTS: ADEQUATE

## 2016-12-09 ENCOUNTER — Other Ambulatory Visit: Payer: Self-pay | Admitting: Internal Medicine

## 2016-12-09 DIAGNOSIS — J90 Pleural effusion, not elsewhere classified: Secondary | ICD-10-CM

## 2016-12-09 DIAGNOSIS — J189 Pneumonia, unspecified organism: Secondary | ICD-10-CM

## 2016-12-10 ENCOUNTER — Other Ambulatory Visit: Payer: Self-pay | Admitting: Internal Medicine

## 2016-12-10 ENCOUNTER — Ambulatory Visit
Admission: RE | Admit: 2016-12-10 | Discharge: 2016-12-10 | Disposition: A | Discharge status: Home / Self Care | Payer: Medicare Other | Source: Ambulatory Visit | Attending: Internal Medicine | Admitting: Internal Medicine

## 2016-12-10 DIAGNOSIS — J189 Pneumonia, unspecified organism: Secondary | ICD-10-CM

## 2016-12-10 DIAGNOSIS — I422 Other hypertrophic cardiomyopathy: Secondary | ICD-10-CM

## 2016-12-10 DIAGNOSIS — E278 Other specified disorders of adrenal gland: Secondary | ICD-10-CM

## 2016-12-10 DIAGNOSIS — J90 Pleural effusion, not elsewhere classified: Secondary | ICD-10-CM

## 2016-12-14 ENCOUNTER — Encounter (HOSPITAL_COMMUNITY): Payer: Self-pay

## 2016-12-14 ENCOUNTER — Ambulatory Visit (HOSPITAL_COMMUNITY): Payer: Medicare Other | Attending: Cardiology

## 2016-12-15 ENCOUNTER — Ambulatory Visit
Admission: RE | Admit: 2016-12-15 | Discharge: 2016-12-15 | Disposition: A | Discharge status: Home / Self Care | Payer: Medicare Other | Source: Ambulatory Visit | Attending: Internal Medicine | Admitting: Internal Medicine

## 2016-12-15 DIAGNOSIS — E278 Other specified disorders of adrenal gland: Secondary | ICD-10-CM

## 2016-12-17 ENCOUNTER — Other Ambulatory Visit: Payer: Self-pay | Admitting: Internal Medicine

## 2016-12-17 DIAGNOSIS — K8689 Other specified diseases of pancreas: Secondary | ICD-10-CM

## 2016-12-17 DIAGNOSIS — E278 Other specified disorders of adrenal gland: Secondary | ICD-10-CM

## 2016-12-21 ENCOUNTER — Ambulatory Visit (INDEPENDENT_AMBULATORY_CARE_PROVIDER_SITE_OTHER): Payer: Medicare Other | Admitting: Podiatry

## 2016-12-21 ENCOUNTER — Encounter: Payer: Self-pay | Admitting: Podiatry

## 2016-12-21 DIAGNOSIS — M79676 Pain in unspecified toe(s): Secondary | ICD-10-CM | POA: Diagnosis not present

## 2016-12-21 DIAGNOSIS — Z794 Long term (current) use of insulin: Secondary | ICD-10-CM

## 2016-12-21 DIAGNOSIS — E1140 Type 2 diabetes mellitus with diabetic neuropathy, unspecified: Secondary | ICD-10-CM

## 2016-12-21 DIAGNOSIS — B351 Tinea unguium: Secondary | ICD-10-CM | POA: Diagnosis not present

## 2016-12-21 NOTE — Progress Notes (Signed)
Patient ID: Eric [name], male   DOB: October 16, 1923, 83 y.o.   MRN: [id number] HPI  Complaint:  Visit Type: Patient returns to my office for continued preventative foot care services. Complaint: Patient states" my nails have grown long and thick and become painful to walk and wear shoes" Patient has been diagnosed with DM with neuropathy. .. He presents for preventative foot care services. No changes to ROS.    Podiatric Exam: Vascular: dorsalis pedis and posterior tibial pulses are negative. Capillary return is immediate. Temperature gradient is negative due to severe foot swelling.. Skin turgor WNL, bilateral swelling  Sensorium: Diminished  Semmes Weinstein monofilament test. Normal tactile sensation bilaterally.  Nail Exam: Pt has thick disfigured discolored nails with subungual debris noted bilateral entire nail hallux through fifth toenails Ulcer Exam: There is no evidence of ulcer or pre-ulcerative changes or infection. Orthopedic Exam: Muscle tone and strength are WNL. No limitations in general ROM. No crepitus or effusions noted. Foot type and digits show no abnormalities. Bony prominences are unremarkable. Skin: No Porokeratosis. No infection or ulcers  Diagnosis:  Tinea unguium, Pain in right toe, pain in left toes  Treatment & Plan Procedures and Treatment: Consent by patient was obtained for treatment procedures. The patient understood the discussion of treatment and procedures well. All questions were answered thoroughly reviewed. Debridement of mycotic and hypertrophic toenails, 1 through 5 bilateral and clearing of subungual debris. No ulceration, no infection noted.  Return Visit-Office Procedure: Patient instructed to return to the office for a follow up visit 3 months for continued evaluation and treatment.   Gardiner Barefoot DPM

## 2016-12-29 ENCOUNTER — Other Ambulatory Visit: Payer: Medicare Other

## 2016-12-29 ENCOUNTER — Ambulatory Visit
Admission: RE | Admit: 2016-12-29 | Discharge: 2016-12-29 | Disposition: A | Discharge status: Home / Self Care | Payer: Medicare Other | Source: Ambulatory Visit | Attending: Internal Medicine | Admitting: Internal Medicine

## 2016-12-29 DIAGNOSIS — E278 Other specified disorders of adrenal gland: Secondary | ICD-10-CM

## 2016-12-29 MED ORDER — GADOBENATE DIMEGLUMINE 529 MG/ML IV SOLN
8.0000 mL | Freq: Once | INTRAVENOUS | Status: AC | PRN
  Administered 2016-12-29: 8 mL via INTRAVENOUS

## 2017-01-06 ENCOUNTER — Other Ambulatory Visit: Payer: Medicare Other

## 2017-02-10 ENCOUNTER — Other Ambulatory Visit: Payer: Self-pay | Admitting: Internal Medicine

## 2017-02-10 DIAGNOSIS — J189 Pneumonia, unspecified organism: Secondary | ICD-10-CM

## 2017-02-15 ENCOUNTER — Ambulatory Visit
Admission: RE | Admit: 2017-02-15 | Discharge: 2017-02-15 | Disposition: A | Discharge status: Home / Self Care | Payer: Medicare Other | Source: Ambulatory Visit | Attending: Internal Medicine | Admitting: Internal Medicine

## 2017-02-15 DIAGNOSIS — J189 Pneumonia, unspecified organism: Secondary | ICD-10-CM

## 2017-03-08 ENCOUNTER — Ambulatory Visit (INDEPENDENT_AMBULATORY_CARE_PROVIDER_SITE_OTHER): Payer: Self-pay

## 2017-03-08 ENCOUNTER — Ambulatory Visit (INDEPENDENT_AMBULATORY_CARE_PROVIDER_SITE_OTHER): Payer: Medicare Other | Admitting: Orthopaedic Surgery

## 2017-03-08 ENCOUNTER — Encounter (INDEPENDENT_AMBULATORY_CARE_PROVIDER_SITE_OTHER): Payer: Self-pay | Admitting: Orthopaedic Surgery

## 2017-03-08 DIAGNOSIS — M25561 Pain in right knee: Secondary | ICD-10-CM | POA: Diagnosis not present

## 2017-03-08 DIAGNOSIS — M25562 Pain in left knee: Secondary | ICD-10-CM | POA: Diagnosis not present

## 2017-03-08 DIAGNOSIS — G8929 Other chronic pain: Secondary | ICD-10-CM

## 2017-03-08 MED ORDER — BUPIVACAINE HCL 0.5 % IJ SOLN
2.0000 mL | INTRAMUSCULAR | Status: AC | PRN
  Administered 2017-03-08: 2 mL via INTRA_ARTICULAR

## 2017-03-08 MED ORDER — DICLOFENAC SODIUM 1 % TD GEL: 2.0000 g | Freq: Four times a day (QID) | TRANSDERMAL | 5 refills | Status: DC

## 2017-03-08 MED ORDER — METHYLPREDNISOLONE ACETATE 40 MG/ML IJ SUSP
40.0000 mg | INTRAMUSCULAR | Status: AC | PRN
  Administered 2017-03-08: 40 mg via INTRA_ARTICULAR

## 2017-03-08 MED ORDER — LIDOCAINE HCL 1 % IJ SOLN
2.0000 mL | INTRAMUSCULAR | Status: AC | PRN
  Administered 2017-03-08: 2 mL

## 2017-03-08 NOTE — Progress Notes (Signed)
Office Visit Note   Patient: Eric [name]           Date of Birth: [birth year]           MRN: [id number] Visit Date: 03/08/2017              Requested by: [name], MD [address] [place],  [address] PCP: [name], MD   Assessment & Plan: Visit Diagnoses:  1. Chronic pain of both knees     Plan: Impression is advanced degenerative joint disease bilateral knees.  Prescription for Voltaren.  Patient not a candidate for a total knee replacement given age.  Bilateral cortisone injections were performed today.  Questions encouraged and answered.  Follow-up as needed.  Follow-Up Instructions: Return if symptoms worsen or fail to improve.   Orders:  Orders Placed This Encounter  Procedures  . XR KNEE 3 VIEW RIGHT  . XR KNEE 3 VIEW LEFT   Meds ordered this encounter  Medications  . diclofenac sodium (VOLTAREN) 1 % GEL    Sig: Apply 2 g topically 4 (four) times daily.    Dispense:  1 Tube    Refill:  5      Procedures: Large Joint Inj: bilateral knee on 03/08/2017 4:58 PM Indications: pain Details: 22 G needle  Arthrogram: No  Medications (Right): 2 mL lidocaine 1 %; 2 mL bupivacaine 0.5 %; 40 mg methylPREDNISolone acetate 40 MG/ML Medications (Left): 2 mL lidocaine 1 %; 2 mL bupivacaine 0.5 %; 40 mg methylPREDNISolone acetate 40 MG/ML Outcome: tolerated well, no immediate complications Patient was prepped and draped in the usual sterile fashion.       Clinical Data: No additional findings.   Subjective: Chief Complaint  Patient presents with  . Right Knee - Pain  . Left Knee - Pain    Patient is 83 year old gentleman but with bilateral knee pain that has progressively gotten worse over the last 1-2 weeks.  He endorses cracking and giving way.  He walks with a Rollator.  He has chronic pain that does not radiate.  Denies any numbness and tingling.    Review of Systems  Constitutional: Negative.   All other systems reviewed and are  negative.    Objective: Vital Signs: There were no vitals taken for this visit.  Physical Exam  Constitutional: He is oriented to person, place, and time. He appears well-developed and well-nourished.  HENT:  Head: Normocephalic and atraumatic.  Eyes: Pupils are equal, round, and reactive to light.  Neck: Neck supple.  Pulmonary/Chest: Effort normal.  Abdominal: Soft.  Musculoskeletal: Normal range of motion.  Neurological: He is alert and oriented to person, place, and time.  Skin: Skin is warm.  Psychiatric: He has a normal mood and affect. His behavior is normal. Judgment and thought content normal.  Nursing note and vitals reviewed.   Ortho Exam Bilateral knee exam shows no joint effusion.  Cause and cruciates are stable.  Significant patellar crepitus. Specialty Comments:  No specialty comments available.  Imaging: Xr Knee 3 View Left  Result Date: 03/08/2017 Advanced degenerative joint disease  Xr Knee 3 View Right  Result Date: 03/08/2017 Advanced degenerative joint disease    PMFS History: Patient Active Problem List   Diagnosis Date Noted  . HCAP (healthcare-associated pneumonia)   . Severe sepsis ([place])   . Sepsis due to pneumonia ([place]) 10/30/2016  . History of DVT (deep vein thrombosis) 10/30/2016  . UTI (urinary tract infection) 10/03/2016  . Memory loss 06/22/2016  .  AKI (acute kidney injury) ([place]) 11/12/2014  . TIA (transient ischemic attack) 11/09/2014  . Seizure ([place]) 02/08/2012  . Lesion of lung 02/08/2012  . DM2 (diabetes mellitus, type 2) ([place]) 02/08/2012  . Hypertensive encephalopathy 01/06/2012  . Hyperglycemia 01/06/2012  . MGUS (monoclonal gammopathy of unknown significance) 10/28/2011  . Bilateral leg edema 10/28/2011  . Fall at home 10/28/2011  . Failure to thrive in adult 10/28/2011  . Type 2 diabetes mellitus with hypoglycemia ([place]) 10/27/2011  . Dysphagia 10/27/2011  . Encephalopathy 10/27/2011  . Hypernatremia 10/24/2011  .  Altered mental status 10/20/2011  . Generalized weakness 10/18/2011  . Encephalopathy, metabolic   . Dependent edema 10/02/2011  . Hyponatremia 10/02/2011  . Pneumonia 04/24/2011  . CKD (chronic kidney disease) stage 3, GFR 30-59 ml/min ([place]) 04/19/2011  . Bronchitis, acute 04/16/2011  . Edema 03/30/2011  . OSTEOARTHRITIS 03/04/2010  . LIVER DISORDER 12/09/2009  . BACK PAIN, LUMBAR 09/09/2009  . ABDOMINAL PAIN, UNSPECIFIED SITE 01/31/2009  . SYNCOPE 01/08/2009  . ELBOW PAIN, LEFT 12/30/2008  . ABNORMAL ELECTROCARDIOGRAM 12/30/2008  . DEPRESSION 12/25/2008  . WEIGHT LOSS 12/25/2008  . NOSEBLEED 12/25/2008  . VITAMIN B12 DEFICIENCY 10/24/2008  . DM (diabetes mellitus), type 1, uncontrolled w/ophthalmic complication ([place]) 28/36/6294  . PANCYTOPENIA 10/08/2008  . PSA, INCREASED 10/08/2008  . DYSURIA 10/03/2008  . LEG PAIN, BILATERAL 12/26/2007  . KNEE PAIN, LEFT 08/18/2007  . COUGH 04/18/2007  . Hyperlipidemia 02/17/2007  . ANEMIA-NOS 02/17/2007  . Essential hypertension 02/17/2007  . ALLERGIC RHINITIS 02/17/2007  . GERD 02/17/2007  . GASTROPARESIS 02/17/2007  . Benign prostatic hyperplasia 02/17/2007  . HYPERURICEMIA 02/17/2007   Past Medical History:  Diagnosis Date  . Adult failure to thrive   . Altered mental status   . ANEMIA-NOS 02/17/2007  . Anxiety   . BACK PAIN, LUMBAR   . BENIGN PROSTATIC HYPERTROPHY   . Bilateral leg edema   . CAP (community acquired pneumonia)   . CKD (chronic kidney disease)   . DEPRESSION 12/25/2008  . Diabetes mellitus   . DIABETES MELLITUS, TYPE I, CONTROLLED, WITH RETINOPATHY   . Encephalopathy, metabolic   . Gastroparesis   . GERD 02/17/2007  . Hyperlipidemia   . HYPERTENSION 02/17/2007  . Hyponatremia   . LIVER DISORDER   . OSTEOARTHRITIS 03/04/2010  . Pneumonia   . PSA, INCREASED   . Stroke ([place])   . VITAMIN B12 DEFICIENCY     Family History  Problem Relation Age of Onset  . Cancer Father        had uncertain type of  cancer  . Cancer Brother        Prostate Cancer  . Cancer Brother        Prostate Cancer  . Diabetes Other     Past Surgical History:  Procedure Laterality Date  . LEFT HEART CATHETERIZATION WITH CORONARY ANGIOGRAM N/A 04/23/2011   Procedure: LEFT HEART CATHETERIZATION WITH CORONARY ANGIOGRAM;  Surgeon: Peter M Martinique, MD;  Location: [facility] CATH LAB;  Service: Cardiovascular;  Laterality: N/A;  . TRANSURETHRAL RESECTION OF PROSTATE     Social History   Occupational History  . 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 Smoking status: Never Smoker  . Smokeless tobacco: Never Used  Substance and Sexual Activity  . Alcohol use: No  . Drug use: No  . Sexual activity: No

## 2017-03-22 ENCOUNTER — Ambulatory Visit: Payer: Medicare Other | Admitting: Podiatry

## 2017-03-22 ENCOUNTER — Encounter: Payer: Self-pay | Admitting: Podiatry

## 2017-03-22 DIAGNOSIS — B351 Tinea unguium: Secondary | ICD-10-CM

## 2017-03-22 DIAGNOSIS — M79676 Pain in unspecified toe(s): Secondary | ICD-10-CM

## 2017-03-22 DIAGNOSIS — E1140 Type 2 diabetes mellitus with diabetic neuropathy, unspecified: Secondary | ICD-10-CM | POA: Diagnosis not present

## 2017-03-22 DIAGNOSIS — Z794 Long term (current) use of insulin: Secondary | ICD-10-CM

## 2017-03-22 NOTE — Progress Notes (Signed)
Patient ID: Eric [name], male   DOB: May 17, 1923, 83 y.o.   MRN: [id number] HPI  Complaint:  Visit Type: Patient returns to my office for continued preventative foot care services. Complaint: Patient states" my nails have grown long and thick and become painful to walk and wear shoes" Patient has been diagnosed with DM with neuropathy. .. He presents for preventative foot care services. No changes to ROS.    Podiatric Exam: Vascular: dorsalis pedis and posterior tibial pulses are negative. Capillary return is immediate. Temperature gradient is negative due to severe foot swelling.. Skin turgor WNL, bilateral swelling  Sensorium: Diminished  Semmes Weinstein monofilament test. Normal tactile sensation bilaterally.  Nail Exam: Pt has thick disfigured discolored nails with subungual debris noted bilateral entire nail hallux through fifth toenails Ulcer Exam: There is no evidence of ulcer or pre-ulcerative changes or infection. Orthopedic Exam: Muscle tone and strength are WNL. No limitations in general ROM. No crepitus or effusions noted. Foot type and digits show no abnormalities. Bony prominences are unremarkable. Skin: No Porokeratosis. No infection or ulcers  Diagnosis:  Tinea unguium, Pain in right toe, pain in left toes  Treatment & Plan Procedures and Treatment: Consent by patient was obtained for treatment procedures. The patient understood the discussion of treatment and procedures well. All questions were answered thoroughly reviewed. Debridement of mycotic and hypertrophic toenails, 1 through 5 bilateral and clearing of subungual debris. No ulceration, no infection noted.  Return Visit-Office Procedure: Patient instructed to return to the office for a follow up visit 3 months for continued evaluation and treatment.   Gardiner Barefoot DPM

## 2017-03-29 ENCOUNTER — Telehealth (INDEPENDENT_AMBULATORY_CARE_PROVIDER_SITE_OTHER): Payer: Self-pay

## 2017-03-29 NOTE — Telephone Encounter (Signed)
error 

## 2017-05-06 ENCOUNTER — Other Ambulatory Visit: Payer: Self-pay | Admitting: Internal Medicine

## 2017-05-06 DIAGNOSIS — K869 Disease of pancreas, unspecified: Secondary | ICD-10-CM

## 2017-05-15 ENCOUNTER — Other Ambulatory Visit: Payer: Medicare Other

## 2017-05-15 ENCOUNTER — Other Ambulatory Visit: Payer: Self-pay | Admitting: Internal Medicine

## 2017-05-15 ENCOUNTER — Ambulatory Visit
Admission: RE | Admit: 2017-05-15 | Discharge: 2017-05-15 | Disposition: A | Discharge status: Home / Self Care | Payer: Medicare Other | Source: Ambulatory Visit | Attending: Internal Medicine | Admitting: Internal Medicine

## 2017-05-15 DIAGNOSIS — K869 Disease of pancreas, unspecified: Secondary | ICD-10-CM

## 2017-05-24 ENCOUNTER — Ambulatory Visit
Admission: RE | Admit: 2017-05-24 | Discharge: 2017-05-24 | Disposition: A | Discharge status: Home / Self Care | Payer: Medicare Other | Source: Ambulatory Visit | Attending: Internal Medicine | Admitting: Internal Medicine

## 2017-05-24 DIAGNOSIS — K869 Disease of pancreas, unspecified: Secondary | ICD-10-CM

## 2017-05-24 MED ORDER — GADOBENATE DIMEGLUMINE 529 MG/ML IV SOLN
7.0000 mL | Freq: Once | INTRAVENOUS | Status: AC | PRN
  Administered 2017-05-24: 7 mL via INTRAVENOUS

## 2017-06-14 ENCOUNTER — Emergency Department (HOSPITAL_COMMUNITY): Payer: Medicare Other

## 2017-06-14 ENCOUNTER — Emergency Department (HOSPITAL_COMMUNITY)
Admission: EM | Admit: 2017-06-14 | Discharge: 2017-06-14 | Disposition: A | Discharge status: Home / Self Care | Payer: Medicare Other | Attending: Physician Assistant | Admitting: Physician Assistant

## 2017-06-14 ENCOUNTER — Encounter (HOSPITAL_COMMUNITY): Payer: Self-pay

## 2017-06-14 DIAGNOSIS — Z79899 Other long term (current) drug therapy: Secondary | ICD-10-CM | POA: Diagnosis not present

## 2017-06-14 DIAGNOSIS — E1122 Type 2 diabetes mellitus with diabetic chronic kidney disease: Secondary | ICD-10-CM | POA: Insufficient documentation

## 2017-06-14 DIAGNOSIS — R531 Weakness: Secondary | ICD-10-CM | POA: Diagnosis not present

## 2017-06-14 DIAGNOSIS — Z8673 Personal history of transient ischemic attack (TIA), and cerebral infarction without residual deficits: Secondary | ICD-10-CM | POA: Insufficient documentation

## 2017-06-14 DIAGNOSIS — Z794 Long term (current) use of insulin: Secondary | ICD-10-CM | POA: Insufficient documentation

## 2017-06-14 DIAGNOSIS — R5383 Other fatigue: Secondary | ICD-10-CM | POA: Diagnosis not present

## 2017-06-14 DIAGNOSIS — I129 Hypertensive chronic kidney disease with stage 1 through stage 4 chronic kidney disease, or unspecified chronic kidney disease: Secondary | ICD-10-CM | POA: Diagnosis not present

## 2017-06-14 DIAGNOSIS — R4182 Altered mental status, unspecified: Secondary | ICD-10-CM | POA: Diagnosis present

## 2017-06-14 DIAGNOSIS — N183 Chronic kidney disease, stage 3 (moderate): Secondary | ICD-10-CM | POA: Diagnosis not present

## 2017-06-14 DIAGNOSIS — R2681 Unsteadiness on feet: Secondary | ICD-10-CM | POA: Diagnosis not present

## 2017-06-14 LAB — CBC WITH DIFFERENTIAL/PLATELET
Basophils Absolute: 0 10*3/uL (ref 0.0–0.1)
Basophils Relative: 0 %
Eosinophils Absolute: 0.1 10*3/uL (ref 0.0–0.7)
Eosinophils Relative: 1 %
HEMATOCRIT: 34.1 % — AB (ref 39.0–52.0)
HEMOGLOBIN: 11.7 g/dL — AB (ref 13.0–17.0)
LYMPHS ABS: 1.4 10*3/uL (ref 0.7–4.0)
Lymphocytes Relative: 26 %
MCH: 33.9 pg (ref 26.0–34.0)
MCHC: 34.3 g/dL (ref 30.0–36.0)
MCV: 98.8 fL (ref 78.0–100.0)
Monocytes Absolute: 0.1 10*3/uL (ref 0.1–1.0)
Monocytes Relative: 2 %
NEUTROS ABS: 3.8 10*3/uL (ref 1.7–7.7)
NEUTROS PCT: 71 %
Platelets: 188 10*3/uL (ref 150–400)
RBC: 3.45 MIL/uL — AB (ref 4.22–5.81)
RDW: 12.5 % (ref 11.5–15.5)
WBC: 5.4 10*3/uL (ref 4.0–10.5)

## 2017-06-14 LAB — COMPREHENSIVE METABOLIC PANEL
ALT: 21 U/L (ref 17–63)
ANION GAP: 11 (ref 5–15)
AST: 30 U/L (ref 15–41)
Albumin: 3.2 g/dL — ABNORMAL LOW (ref 3.5–5.0)
Alkaline Phosphatase: 174 U/L — ABNORMAL HIGH (ref 38–126)
BUN: 32 mg/dL — ABNORMAL HIGH (ref 6–20)
CHLORIDE: 107 mmol/L (ref 101–111)
CO2: 19 mmol/L — AB (ref 22–32)
Calcium: 9.8 mg/dL (ref 8.9–10.3)
Creatinine, Ser: 2.12 mg/dL — ABNORMAL HIGH (ref 0.61–1.24)
GFR calc non Af Amer: 26 mL/min — ABNORMAL LOW (ref >60)
GFR, EST AFRICAN AMERICAN: 30 mL/min — AB (ref >60)
Glucose, Bld: 128 mg/dL — ABNORMAL HIGH (ref 65–99)
POTASSIUM: 3.8 mmol/L (ref 3.5–5.1)
SODIUM: 137 mmol/L (ref 135–145)
Total Bilirubin: 0.5 mg/dL (ref 0.3–1.2)
Total Protein: 7.5 g/dL (ref 6.5–8.1)

## 2017-06-14 LAB — I-STAT TROPONIN, ED: TROPONIN I, POC: 0.05 ng/mL (ref 0.00–0.08)

## 2017-06-14 LAB — CBG MONITORING, ED: GLUCOSE-CAPILLARY: 147 mg/dL — AB (ref 65–99)

## 2017-06-14 LAB — I-STAT CG4 LACTIC ACID, ED: Lactic Acid, Venous: 1.73 mmol/L (ref 0.5–1.9)

## 2017-06-14 MED ORDER — HYDRALAZINE HCL 25 MG PO TABS
25.0000 mg | ORAL_TABLET | Freq: Once | ORAL | Status: AC
  Administered 2017-06-14: 25 mg via ORAL
  Filled 2017-06-14: qty 1

## 2017-06-14 MED ORDER — METOPROLOL TARTRATE 25 MG PO TABS
25.0000 mg | ORAL_TABLET | Freq: Once | ORAL | Status: AC
  Administered 2017-06-14: 25 mg via ORAL
  Filled 2017-06-14: qty 1

## 2017-06-14 MED ORDER — CLONIDINE HCL 0.1 MG PO TABS
0.1000 mg | ORAL_TABLET | Freq: Once | ORAL | Status: AC
  Administered 2017-06-14: 0.1 mg via ORAL
  Filled 2017-06-14: qty 1

## 2017-06-14 NOTE — ED Notes (Signed)
Patient transported to MRI 

## 2017-06-14 NOTE — ED Notes (Signed)
Pt returned from MRI.  Pt continues to be confused.  No complaints voiced

## 2017-06-14 NOTE — ED Provider Notes (Addendum)
[facility] EMERGENCY DEPARTMENT Provider Note   CSN: [id number] Arrival date & time: 06/14/17  0932     History   Chief Complaint Chief Complaint  Patient presents with  . Altered Mental Status    HPI CHIBUIKEM THANG is a 84 y.o. male.  HPI   Patient is a 84 year old male presenting with a week and a half of increasing altered mental status.  Patient's caregiver is here with him and she reports that for the last week and half has become more altered, he is been leaning slightly to the left.  She reports taking of the primary care physician and dropping off a urine, which did not show any infection.  Is got no focal symptoms.  She just feels he has been more tired, all over, unable to walk with his walker as usual.  He also has been leaning to the left. Past Medical History:  Diagnosis Date  . Adult failure to thrive   . Altered mental status   . ANEMIA-NOS 02/17/2007  . Anxiety   . BACK PAIN, LUMBAR   . BENIGN PROSTATIC HYPERTROPHY   . Bilateral leg edema   . CAP (community acquired pneumonia)   . CKD (chronic kidney disease)   . DEPRESSION 12/25/2008  . Diabetes mellitus   . DIABETES MELLITUS, TYPE I, CONTROLLED, WITH RETINOPATHY   . Encephalopathy, metabolic   . Gastroparesis   . GERD 02/17/2007  . Hyperlipidemia   . HYPERTENSION 02/17/2007  . Hyponatremia   . LIVER DISORDER   . OSTEOARTHRITIS 03/04/2010  . Pneumonia   . PSA, INCREASED   . Stroke ([place])   . VITAMIN B12 DEFICIENCY     Patient Active Problem List   Diagnosis Date Noted  . HCAP (healthcare-associated pneumonia)   . Severe sepsis ([place])   . Sepsis due to pneumonia ([place]) 10/30/2016  . History of DVT (deep vein thrombosis) 10/30/2016  . UTI (urinary tract infection) 10/03/2016  . Memory loss 06/22/2016  . AKI (acute kidney injury) ([place]) 11/12/2014  . TIA (transient ischemic attack) 11/09/2014  . Seizure ([place]) 02/08/2012  . Lesion of lung 02/08/2012  . DM2 (diabetes mellitus,  type 2) ([place]) 02/08/2012  . Hypertensive encephalopathy 01/06/2012  . Hyperglycemia 01/06/2012  . MGUS (monoclonal gammopathy of unknown significance) 10/28/2011  . Bilateral leg edema 10/28/2011  . Fall at home 10/28/2011  . Failure to thrive in adult 10/28/2011  . Type 2 diabetes mellitus with hypoglycemia ([place]) 10/27/2011  . Dysphagia 10/27/2011  . Encephalopathy 10/27/2011  . Hypernatremia 10/24/2011  . Altered mental status 10/20/2011  . Generalized weakness 10/18/2011  . Encephalopathy, metabolic   . Dependent edema 10/02/2011  . Hyponatremia 10/02/2011  . Pneumonia 04/24/2011  . CKD (chronic kidney disease) stage 3, GFR 30-59 ml/min ([place]) 04/19/2011  . Bronchitis, acute 04/16/2011  . Edema 03/30/2011  . OSTEOARTHRITIS 03/04/2010  . LIVER DISORDER 12/09/2009  . BACK PAIN, LUMBAR 09/09/2009  . ABDOMINAL PAIN, UNSPECIFIED SITE 01/31/2009  . SYNCOPE 01/08/2009  . ELBOW PAIN, LEFT 12/30/2008  . ABNORMAL ELECTROCARDIOGRAM 12/30/2008  . DEPRESSION 12/25/2008  . WEIGHT LOSS 12/25/2008  . NOSEBLEED 12/25/2008  . VITAMIN B12 DEFICIENCY 10/24/2008  . DM (diabetes mellitus), type 1, uncontrolled w/ophthalmic complication ([place]) 46/65/9935  . PANCYTOPENIA 10/08/2008  . PSA, INCREASED 10/08/2008  . DYSURIA 10/03/2008  . LEG PAIN, BILATERAL 12/26/2007  . KNEE PAIN, LEFT 08/18/2007  . COUGH 04/18/2007  . Hyperlipidemia 02/17/2007  . ANEMIA-NOS 02/17/2007  . Essential hypertension 02/17/2007  . ALLERGIC  RHINITIS 02/17/2007  . GERD 02/17/2007  . GASTROPARESIS 02/17/2007  . Benign prostatic hyperplasia 02/17/2007  . HYPERURICEMIA 02/17/2007    Past Surgical History:  Procedure Laterality Date  . LEFT HEART CATHETERIZATION WITH CORONARY ANGIOGRAM N/A 04/23/2011   Procedure: LEFT HEART CATHETERIZATION WITH CORONARY ANGIOGRAM;  Surgeon: Peter M Martinique, MD;  Location: [facility] CATH LAB;  Service: Cardiovascular;  Laterality: N/A;  . TRANSURETHRAL RESECTION OF PROSTATE         Home  Medications    Prior to Admission medications   Medication Sig Start Date End Date Taking? Authorizing Provider  ACCU-CHEK AVIVA PLUS test strip  05/01/14  Yes [provider]  acetaminophen (TYLENOL) 500 MG tablet Take 500 mg by mouth 2 (two) times daily. Reported on 06/19/2015   Yes [provider]  amLODipine (NORVASC) 10 MG tablet Take 10 mg by mouth every evening.  05/26/16  Yes [provider]  atorvastatin (LIPITOR) 20 MG tablet Take 1 tablet (20 mg total) by mouth daily at 6 PM. 11/13/14  Yes Charlynne Cousins, MD  B-D UF III MINI PEN NEEDLES 31G X 5 MM MISC  06/20/14  Yes [provider]  calamine lotion Apply 1 application topically 2 (two) times daily as needed for itching (to shoulders/ back).   Yes [provider]  calcitRIOL (ROCALTROL) 0.25 MCG capsule Take 0.25 mcg by mouth daily.  12/26/11  Yes [provider]  cholecalciferol (VITAMIN D) 1000 UNITS tablet Take 2,000 Units by mouth daily.    Yes [provider]  cloNIDine (CATAPRES) 0.1 MG tablet Take 0.1 mg by mouth 2 (two) times daily.  05/03/16  Yes [provider]  diclofenac sodium (VOLTAREN) 1 % GEL Apply 2 g topically 4 (four) times daily. 03/08/17  Yes Leandrew Koyanagi, MD  docusate sodium (COLACE) 100 MG capsule Take 100 mg by mouth daily.   Yes [provider]  donepezil (ARICEPT) 10 MG tablet Take 1 tablet (10 mg total) by mouth at bedtime. 06/22/16  Yes Dennie Bible, NP  fluticasone [facility]) 50 MCG/ACT nasal spray Place 1 spray into both nostrils 2 (two) times daily. Patient taking differently: Place 1 spray into both nostrils 2 (two) times daily as needed for allergies.  10/04/16  Yes Patrecia Pour, Christean Grief, MD  hydrALAZINE (APRESOLINE) 25 MG tablet Take 75 mg by mouth 3 (three) times daily.  10/22/14  Yes [provider]  hydrOXYzine (ATARAX/VISTARIL) 10 MG tablet TAKE 1 TABLET BY MOUTH TWICE DAILY AS NEEDED FOR ITCHING 12/10/14  Yes  [provider]  insulin aspart (NOVOLOG FLEXPEN) 100 UNIT/ML FlexPen Inject 0-5 Units into the skin 3 (three) times daily as needed for high blood sugar (sliding scale).   Yes [provider]  Insulin Glargine (LANTUS SOLOSTAR) 100 UNIT/ML Solostar Pen Inject 25 Units into the skin every morning.    Yes [provider]  latanoprost (XALATAN) 0.005 % ophthalmic solution Place 1 drop into both eyes at bedtime. 06/11/17  Yes [provider]  lisinopril (PRINIVIL,ZESTRIL) 40 MG tablet Take 1 tablet (40 mg total) by mouth 2 (two) times daily. Patient taking differently: Take 20 mg by mouth daily.  11/17/14  Yes Charlynne Cousins, MD  loratadine (CLARITIN) 10 MG tablet Take 10 mg by mouth daily with lunch.   Yes [provider]  LUMIGAN 0.01 % SOLN Place 1 drop into both eyes at bedtime. 08/03/16  Yes [provider]  metoprolol succinate (TOPROL-XL) 25 MG 24 hr tablet Take  25 mg by mouth every evening.  02/18/14  Yes [provider]  OVER THE COUNTER MEDICATION Apply 1 application topically 4 (four) times daily as needed (clotrimazole= to the buttocks for rash as needed).    Yes [provider]  pantoprazole (PROTONIX) 40 MG tablet Take 40 mg by mouth daily.  05/04/15  Yes [provider]  phenytoin (DILANTIN) 100 MG ER capsule Take 300 mg by mouth at bedtime.   Yes [provider]  Polyethyl Glycol-Propyl Glycol (SYSTANE ULTRA) 0.4-0.3 % SOLN Place 1 drop into both eyes 3 (three) times daily as needed (dry eyes).   Yes [provider]  polyethylene glycol powder (GLYCOLAX/MIRALAX) powder Take 17 g by mouth daily as needed for mild constipation.  05/18/14  Yes [provider]  Probiotic Product (PROBIOTIC-10 PO) Take 1 capsule by mouth daily.   Yes [provider]  senna (SENOKOT) 8.6 MG TABS Take 1 tablet by mouth daily.   Yes [provider]  Triamcinolone Acetonide (NASACORT AQ  NA) Place 1 spray into both nostrils daily as needed (for allergies).    Yes [provider]  doxycycline (ADOXA) 100 MG tablet Take 1 tablet (100 mg total) by mouth 2 (two) times daily. Patient not taking: Reported on 06/14/2017 11/02/16   Debbe Odea, MD    Family History Family History  Problem Relation Age of Onset  . Cancer Father        had uncertain type of cancer  . Cancer Brother        Prostate Cancer  . Cancer Brother        Prostate Cancer  . Diabetes Other     Social History Social History   Tobacco Use  . Smoking status: Never Smoker  . Smokeless tobacco: Never Used  Substance Use Topics  . Alcohol use: No  . Drug use: No     Allergies   Percocet [oxycodone-acetaminophen]   Review of Systems Review of Systems  Constitutional: Positive for fatigue. Negative for activity change and fever.  Respiratory: Negative for shortness of breath.   Cardiovascular: Negative for chest pain.  Gastrointestinal: Negative for abdominal pain.  Neurological: Positive for weakness. Negative for facial asymmetry and speech difficulty.  All other systems reviewed and are negative.    Physical Exam Updated Vital Signs BP (!) 193/96   Pulse 78   Temp 98 F (36.7 C) (Oral)   Resp 16   SpO2 100%   Physical Exam  Constitutional: He is oriented to person, place, and time. He appears well-nourished.  HENT:  Head: Normocephalic.  Right Ear: External ear normal.  Left Ear: External ear normal.  Mouth/Throat: Oropharynx is clear and moist. No oropharyngeal exudate.  Eyes: Conjunctivae are normal. Right eye exhibits no discharge. Left eye exhibits no discharge.  Cardiovascular: Normal rate and regular rhythm.  No murmur heard. Pulmonary/Chest: Effort normal and breath sounds normal. No respiratory distress.  Abdominal: Soft. He exhibits no distension. There is no tenderness.  Musculoskeletal:  Patient has difficulty raising both arms up due to fatigue.  But no  notable pronator drift.  Cranial nerves appear intact.  Patient able to lift both legs off the bed.  Finger to nose difficult to accomplish because of patient's global fatigue.  Neurological: He is oriented to person, place, and time.  Skin: Skin is warm and dry. He is not diaphoretic.  Psychiatric: He has a normal mood and affect. His behavior is normal.     ED Treatments /  Results  Labs (all labs ordered are listed, but only abnormal results are displayed) Labs Reviewed  COMPREHENSIVE METABOLIC PANEL - Abnormal; Notable for the following components:      Result Value   CO2 19 (*)    Glucose, Bld 128 (*)    BUN 32 (*)    Creatinine, Ser 2.12 (*)    Albumin 3.2 (*)    Alkaline Phosphatase 174 (*)    GFR calc non Af Amer 26 (*)    GFR calc Af Amer 30 (*)    All other components within normal limits  CBC WITH DIFFERENTIAL/PLATELET - Abnormal; Notable for the following components:   RBC 3.45 (*)    Hemoglobin 11.7 (*)    HCT 34.1 (*)    All other components within normal limits  CBG MONITORING, ED - Abnormal; Notable for the following components:   Glucose-Capillary 147 (*)    All other components within normal limits  URINALYSIS, ROUTINE W REFLEX MICROSCOPIC  I-STAT CG4 LACTIC ACID, ED  I-STAT TROPONIN, ED    EKG  EKG Interpretation  Date/Time:  Tuesday June 14 2017 10:59:52 EST Ventricular Rate:  75 PR Interval:    QRS Duration: 89 QT Interval:  408 QTC Calculation: 456 R Axis:   -10 Text Interpretation:  Sinus rhythm unchanged from prior Confirmed by Thomasene Lot Joedy Eickhoff [phone]) on 06/14/2017 3:58:41 PM       Radiology Dg Chest 2 View  Result Date: 06/14/2017 CLINICAL DATA:  Chest pain EXAM: CHEST  2 VIEW COMPARISON:  10/30/2016 chest radiograph. FINDINGS: Stable cardiomediastinal silhouette with normal heart size. No pneumothorax. No pleural effusion. Lungs appear clear, with no acute consolidative airspace disease and no pulmonary edema. IMPRESSION: No active  cardiopulmonary disease. Electronically Signed   By: Ilona Sorrel M.D.   On: 06/14/2017 10:34   Ct Head Wo Contrast  Result Date: 06/14/2017 CLINICAL DATA:  Increased altered mental status, LEFT-sided leaning, ataxia, symptoms for 1 week, question stroke, history of GERD, hypertension, type II diabetes mellitus, stroke EXAM: CT HEAD WITHOUT CONTRAST TECHNIQUE: Contiguous axial images were obtained from the base of the skull through the vertex without intravenous contrast. Sagittal and coronal MPR images reconstructed from axial data set. COMPARISON:  10/30/2016 FINDINGS: Brain: Generalized atrophy. Normal ventricular morphology. No midline shift or mass effect. Small vessel chronic ischemic changes of deep cerebral white matter. Extensive scattered dural calcifications. No definite intracranial hemorrhage, mass lesion, or evidence acute infarction. No extra-axial fluid collections. Scattered artifacts at brainstem. Vascular: Atherosclerotic calcification of internal carotid arteries at skull base. No hyperdense vessels. Skull: Intact Sinuses/Orbits: Clear Other: N/A IMPRESSION: Atrophy with small vessel chronic ischemic changes of deep cerebral white matter. No acute intracranial abnormalities. Electronically Signed   By: Lavonia Dana M.D.   On: 06/14/2017 10:20   Mr Brain Wo Contrast  Result Date: 06/14/2017 CLINICAL DATA:  84 year old male with altered mental status for 1.5 weeks. Possible new left side weakness during that time. EXAM: MRI HEAD WITHOUT CONTRAST TECHNIQUE: Multiplanar, multiecho pulse sequences of the brain and surrounding structures were obtained without intravenous contrast. COMPARISON:  Head CT without contrast 1014 hours today, and earlier. Brain MRI 11/09/2014. FINDINGS: Brain: No convincing restricted diffusion to suggest acute infarction. There is chronic encephalomalacia of the ventral medulla, especially the right medullary pyramid (series 6, image 6), stable since 2016. Several  small chronic lacunar infarcts in the lower left cerebellum are new since 2016. Elsewhere gray and white matter signal is stable since 2016. Mild generalized cerebral  volume loss since the prior MRI. No midline shift, mass effect, evidence of mass lesion, ventriculomegaly, extra-axial collection or acute intracranial hemorrhage. Cervicomedullary junction and pituitary are within normal limits. Dural calcifications. No definite chronic cerebral blood products. Vascular: Major intracranial vascular flow voids are stable since 2016. Skull and upper cervical spine: Chronic cervical disc and endplate degeneration. Multilevel degenerative cervical spinal stenosis, likely worst at the C4-C5 and C6-C7 levels based on the 10/30/2016 CT cervical spine. Visualized bone marrow signal is within normal limits. Sinuses/Orbits: Stable and negative. Other: Left mastoid effusion has resolved since 2016. Grossly normal visible internal auditory structures. Scalp and face soft tissues appear negative. IMPRESSION: 1.  No acute intracranial abnormality. 2. There has been only mild progression of chronic small vessel disease since 2016 in the left cerebellar hemisphere. 3. Chronic multilevel degenerative cervical spinal stenosis. Electronically Signed   By: Genevie Ann M.D.   On: 06/14/2017 15:04    Procedures Procedures (including critical care time)  Medications Ordered in ED Medications  hydrALAZINE (APRESOLINE) tablet 25 mg (not administered)  metoprolol tartrate (LOPRESSOR) tablet 25 mg (not administered)  cloNIDine (CATAPRES) tablet 0.1 mg (0.1 mg Oral Given 06/14/17 1554)     Initial Impression / Assessment and Plan / ED Course  I have reviewed the triage vital signs and the nursing notes.  Pertinent labs & imaging results that were available during my care of the patient were reviewed by me and considered in my medical decision making (see chart for details).     Patient is a 84 year old male presenting with a week  and a half of increasing altered mental status.  Patient's caregiver is here with him and she reports that for the last week and half has become more altered, he is been leaning slightly to the left.    She reports taking of the primary care physician and dropping off a urine, which did not show any infection.  Has got no focal symptoms.  She just feels he has been more tired, all over, unable to walk with his walker as usual.  He also has been leaning to the left.  10:58 AM Will get an initial workup for elderly with altered mental status.  However suspect the patient may have had a stroke given that he is listing to the left.    In further discussion it really appears that this has been a decline over the last several months.  3:54 PM Patient appears very well.  Patient has elevated blood pressure because we have not allowed him to take his hoome blood pressure medications here (while waiting MRI) .  His MRI is now negative.  X-rays negative labs are reassuring.  Has been baseline here.  I do not see any reason for admission at this time.  Will discharge home given his reassuring workup.  4:10 PM Called ANNA, POA X 2 to discuss.  Final Clinical Impressions(s) / ED Diagnoses   Final diagnoses:  None    ED Discharge Orders    None       Macarthur Critchley, MD 06/14/17 1559    Macarthur Critchley, MD 06/14/17 1611

## 2017-06-14 NOTE — Discharge Instructions (Signed)
We are sorry to report that we cannot find a reason why Eric [name] has become increasingly unsteady over the last several weeks.  His urine is reassuring, x-ray is normal.  Labs have been reassuring, and MRI is negative.  We do think that he should follow-up with your primary care physician, often in the aging population physical therapy can be useful in helping establish safe mobile practices.  Please follow-up with PCP as soon as possible.

## 2017-06-14 NOTE — ED Triage Notes (Signed)
Pt from home by [facility] for Altered mental status pt has been getting worse over the past week with left sided lean and unable to use his walker any more. Pt AxO x3 to everything but date. Pt has in home care taker

## 2017-06-14 NOTE — ED Notes (Signed)
Urinal in place to obtain urine specimen

## 2017-06-14 NOTE — ED Notes (Signed)
Pt's caregiver came to pick pt up.  Discharge papers given to her.  Understanding voiced.

## 2017-06-14 NOTE — ED Notes (Signed)
Pt CBG was 147, notified Ruth(RN)

## 2017-06-14 NOTE — ED Notes (Signed)
Olivia Mackie, caregiver, [phone] Jacinto Halim [phone] Doren Custard, son, [place]          [phone]

## 2017-06-14 NOTE — ED Notes (Signed)
Pt given blue scrubs to be discharged in. In [place] C hallway waiting for transportation home.

## 2017-06-21 ENCOUNTER — Ambulatory Visit: Payer: Medicare Other | Admitting: Podiatry

## 2017-06-29 ENCOUNTER — Other Ambulatory Visit: Payer: Self-pay

## 2017-06-29 ENCOUNTER — Emergency Department (HOSPITAL_COMMUNITY)
Admission: EM | Admit: 2017-06-29 | Discharge: 2017-06-29 | Disposition: A | Discharge status: Home / Self Care | Payer: Medicare Other | Attending: Emergency Medicine | Admitting: Emergency Medicine

## 2017-06-29 ENCOUNTER — Emergency Department (HOSPITAL_COMMUNITY): Payer: Medicare Other

## 2017-06-29 ENCOUNTER — Encounter (HOSPITAL_COMMUNITY): Payer: Self-pay

## 2017-06-29 DIAGNOSIS — R296 Repeated falls: Secondary | ICD-10-CM | POA: Diagnosis not present

## 2017-06-29 DIAGNOSIS — R600 Localized edema: Secondary | ICD-10-CM | POA: Insufficient documentation

## 2017-06-29 DIAGNOSIS — E860 Dehydration: Secondary | ICD-10-CM | POA: Insufficient documentation

## 2017-06-29 DIAGNOSIS — R4182 Altered mental status, unspecified: Secondary | ICD-10-CM | POA: Diagnosis present

## 2017-06-29 DIAGNOSIS — I129 Hypertensive chronic kidney disease with stage 1 through stage 4 chronic kidney disease, or unspecified chronic kidney disease: Secondary | ICD-10-CM | POA: Diagnosis not present

## 2017-06-29 DIAGNOSIS — N183 Chronic kidney disease, stage 3 (moderate): Secondary | ICD-10-CM | POA: Diagnosis not present

## 2017-06-29 DIAGNOSIS — E1122 Type 2 diabetes mellitus with diabetic chronic kidney disease: Secondary | ICD-10-CM | POA: Diagnosis not present

## 2017-06-29 LAB — COMPREHENSIVE METABOLIC PANEL
ALBUMIN: 2.8 g/dL — AB (ref 3.5–5.0)
ALT: 22 U/L (ref 17–63)
AST: 29 U/L (ref 15–41)
Alkaline Phosphatase: 162 U/L — ABNORMAL HIGH (ref 38–126)
Anion gap: 9 (ref 5–15)
BILIRUBIN TOTAL: 0.3 mg/dL (ref 0.3–1.2)
BUN: 35 mg/dL — AB (ref 6–20)
CO2: 21 mmol/L — ABNORMAL LOW (ref 22–32)
CREATININE: 2.16 mg/dL — AB (ref 0.61–1.24)
Calcium: 9.1 mg/dL (ref 8.9–10.3)
Chloride: 106 mmol/L (ref 101–111)
GFR calc Af Amer: 29 mL/min — ABNORMAL LOW (ref >60)
GFR, EST NON AFRICAN AMERICAN: 25 mL/min — AB (ref >60)
GLUCOSE: 193 mg/dL — AB (ref 65–99)
Potassium: 4 mmol/L (ref 3.5–5.1)
Sodium: 136 mmol/L (ref 135–145)
Total Protein: 6.4 g/dL — ABNORMAL LOW (ref 6.5–8.1)

## 2017-06-29 LAB — URINALYSIS, ROUTINE W REFLEX MICROSCOPIC
BILIRUBIN URINE: NEGATIVE
Glucose, UA: NEGATIVE mg/dL
HGB URINE DIPSTICK: NEGATIVE
KETONES UR: NEGATIVE mg/dL
LEUKOCYTES UA: NEGATIVE
Nitrite: NEGATIVE
PROTEIN: 100 mg/dL — AB
SQUAMOUS EPITHELIAL / LPF: NONE SEEN
Specific Gravity, Urine: 1.013 (ref 1.005–1.030)
pH: 5 (ref 5.0–8.0)

## 2017-06-29 LAB — CBC
HEMATOCRIT: 30 % — AB (ref 39.0–52.0)
Hemoglobin: 10.3 g/dL — ABNORMAL LOW (ref 13.0–17.0)
MCH: 34.3 pg — AB (ref 26.0–34.0)
MCHC: 34.3 g/dL (ref 30.0–36.0)
MCV: 100 fL (ref 78.0–100.0)
PLATELETS: 174 10*3/uL (ref 150–400)
RBC: 3 MIL/uL — ABNORMAL LOW (ref 4.22–5.81)
RDW: 12.6 % (ref 11.5–15.5)
WBC: 5.1 10*3/uL (ref 4.0–10.5)

## 2017-06-29 LAB — CBG MONITORING, ED: Glucose-Capillary: 107 mg/dL — ABNORMAL HIGH (ref 65–99)

## 2017-06-29 LAB — I-STAT TROPONIN, ED: TROPONIN I, POC: 0.04 ng/mL (ref 0.00–0.08)

## 2017-06-29 LAB — BRAIN NATRIURETIC PEPTIDE: B Natriuretic Peptide: 256 pg/mL — ABNORMAL HIGH (ref 0.0–100.0)

## 2017-06-29 MED ORDER — SODIUM CHLORIDE 0.9 % IV BOLUS (SEPSIS)
500.0000 mL | Freq: Once | INTRAVENOUS | Status: AC
  Administered 2017-06-29: 500 mL via INTRAVENOUS

## 2017-06-29 NOTE — ED Notes (Signed)
ED Provider at bedside. 

## 2017-06-29 NOTE — ED Notes (Signed)
Patient transported to X-ray 

## 2017-06-29 NOTE — ED Triage Notes (Signed)
Pt presents for evaluation of ongoing AMS. Pt family states has been seen multiple times for same but reports PCP wanted pt admitted for observation. Pt is oriented x 3 and reports increased fatigue.

## 2017-06-29 NOTE — Discharge Instructions (Signed)
You were seen in the ED today with weakness and sleepiness. Your labs, x-ray, and CT scans were normal. We would like for you to call your PCP today or first thing tomorrow to schedule an urgent outpatient appointment for re-evaluation. Return to the ED immediately with any new or worsening symptoms.

## 2017-06-29 NOTE — ED Notes (Signed)
Sister at bedside. Reports she was concerned that pt was sleeping all the time even during eating. Also decreased urine output and swelling in hands and feeling. 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

## 2017-06-29 NOTE — ED Provider Notes (Signed)
Emergency Department Provider Note   I have reviewed the triage vital signs and the nursing notes.   HISTORY  Chief Complaint Altered Mental Status   HPI Eric [name] is a 84 y.o. male with PMH of CKD, DM, GERD, prior CVA, and prior AMS episodes and stayed the emergency department for evaluation of increased confusion and fatigue over the last 3 weeks.  The patient's sister is here at bedside and provides most of the history and background information.  The patient has health aides to come in multiple times per day to mainly help the patient with household tasks and cooking.  Over the last 3 weeks he has lost his appetite and she reports "has forgotten how to eat."  He has had several falls with increased generalized weakness.  The sister has also been told about decreased urine output.  No fevers or chills.  No vomiting or diarrhea.  No new medications or changes to medication dosing. He was evaluated on 2/26 in the ED with labs and an MRI which were unremarkable. Patient denies any active symptoms stating that he feels "pretty good."   Level 5 caveat: Fatigue and Confusion.    Past Medical History:  Diagnosis Date  . Adult failure to thrive   . Altered mental status   . ANEMIA-NOS 02/17/2007  . Anxiety   . BACK PAIN, LUMBAR   . BENIGN PROSTATIC HYPERTROPHY   . Bilateral leg edema   . CAP (community acquired pneumonia)   . CKD (chronic kidney disease)   . DEPRESSION 12/25/2008  . Diabetes mellitus   . DIABETES MELLITUS, TYPE I, CONTROLLED, WITH RETINOPATHY   . Encephalopathy, metabolic   . Gastroparesis   . GERD 02/17/2007  . Hyperlipidemia   . HYPERTENSION 02/17/2007  . Hyponatremia   . LIVER DISORDER   . OSTEOARTHRITIS 03/04/2010  . Pneumonia   . PSA, INCREASED   . Stroke ([place])   . VITAMIN B12 DEFICIENCY     Patient Active Problem List   Diagnosis Date Noted  . HCAP (healthcare-associated pneumonia)   . Severe sepsis ([place])   . Sepsis due to pneumonia ([place])  10/30/2016  . History of DVT (deep vein thrombosis) 10/30/2016  . UTI (urinary tract infection) 10/03/2016  . Memory loss 06/22/2016  . AKI (acute kidney injury) ([place]) 11/12/2014  . TIA (transient ischemic attack) 11/09/2014  . Seizure ([place]) 02/08/2012  . Lesion of lung 02/08/2012  . DM2 (diabetes mellitus, type 2) ([place]) 02/08/2012  . Hypertensive encephalopathy 01/06/2012  . Hyperglycemia 01/06/2012  . MGUS (monoclonal gammopathy of unknown significance) 10/28/2011  . Bilateral leg edema 10/28/2011  . Fall at home 10/28/2011  . Failure to thrive in adult 10/28/2011  . Type 2 diabetes mellitus with hypoglycemia ([place]) 10/27/2011  . Dysphagia 10/27/2011  . Encephalopathy 10/27/2011  . Hypernatremia 10/24/2011  . Altered mental status 10/20/2011  . Generalized weakness 10/18/2011  . Encephalopathy, metabolic   . Dependent edema 10/02/2011  . Hyponatremia 10/02/2011  . Pneumonia 04/24/2011  . CKD (chronic kidney disease) stage 3, GFR 30-59 ml/min ([place]) 04/19/2011  . Bronchitis, acute 04/16/2011  . Edema 03/30/2011  . OSTEOARTHRITIS 03/04/2010  . LIVER DISORDER 12/09/2009  . BACK PAIN, LUMBAR 09/09/2009  . ABDOMINAL PAIN, UNSPECIFIED SITE 01/31/2009  . SYNCOPE 01/08/2009  . ELBOW PAIN, LEFT 12/30/2008  . ABNORMAL ELECTROCARDIOGRAM 12/30/2008  . DEPRESSION 12/25/2008  . WEIGHT LOSS 12/25/2008  . NOSEBLEED 12/25/2008  . VITAMIN B12 DEFICIENCY 10/24/2008  . DM (diabetes mellitus), type 1, uncontrolled  w/ophthalmic complication ([place]) 75/64/3329  . PANCYTOPENIA 10/08/2008  . PSA, INCREASED 10/08/2008  . DYSURIA 10/03/2008  . LEG PAIN, BILATERAL 12/26/2007  . KNEE PAIN, LEFT 08/18/2007  . COUGH 04/18/2007  . Hyperlipidemia 02/17/2007  . ANEMIA-NOS 02/17/2007  . Essential hypertension 02/17/2007  . ALLERGIC RHINITIS 02/17/2007  . GERD 02/17/2007  . GASTROPARESIS 02/17/2007  . Benign prostatic hyperplasia 02/17/2007  . HYPERURICEMIA 02/17/2007    Past Surgical History:    Procedure Laterality Date  . LEFT HEART CATHETERIZATION WITH CORONARY ANGIOGRAM N/A 04/23/2011   Procedure: LEFT HEART CATHETERIZATION WITH CORONARY ANGIOGRAM;  Surgeon: Peter M Martinique, MD;  Location: [facility] CATH LAB;  Service: Cardiovascular;  Laterality: N/A;  . TRANSURETHRAL RESECTION OF PROSTATE      Current Outpatient Rx  . Order #: [id number] Class: Historical Med  . Order #: [id number] Class: Historical Med  . Order #: [id number] Class: Historical Med  . Order #: [id number] Class: Print  . Order #: [id number] Class: Historical Med  . Order #: [id number] Class: Historical Med  . Order #: [id number] Class: Historical Med  . Order #: [id number] Class: Historical Med  . Order #: [id number] Class: Historical Med  . Order #: [id number] Class: Normal  . Order #: [id number] Class: Historical Med  . Order #: [id number] Class: Normal  . Order #: [id number] Class: Normal  . Order #: [id number] Class: No Print  . Order #: [id number] Class: Historical Med  . Order #: [id number] Class: Historical Med  . Order #: [id number] Class: Historical Med  . Order #: [id number] Class: Historical Med  . Order #: [id number] Class: Historical Med  . Order #: [id number] Class: No Print  . Order #: [id number] Class: Historical Med  . Order #: [id number] Class: Historical Med  . Order #: [id number] Class: Historical Med  . Order #: [id number] Class: Historical Med  . Order #: [id number] Class: Historical Med  . Order #: [id number] Class: Historical Med  . Order #: [id number] Class: Historical Med  . Order #: [id number] Class: Historical Med  . Order #: [id number] Class: Historical Med  . Order #: [id number] Class: Historical Med  . Order #: [id number] Class: Historical Med    Allergies Percocet [oxycodone-acetaminophen]  Family History  Problem Relation Age of Onset  . Cancer Father        had uncertain type of cancer  . Cancer Brother        Prostate Cancer  . Cancer Brother        Prostate Cancer  . Diabetes Other     Social History Social History    Tobacco Use  . Smoking status: Never Smoker  . Smokeless tobacco: Never Used  Substance Use Topics  . Alcohol use: No  . Drug use: No    Review of Systems  Level 5 caveat: Confusion and Fatigue.   ____________________________________________   PHYSICAL EXAM:  VITAL SIGNS: ED Triage Vitals  Enc Vitals Group     BP 06/29/17 1113 (!) 165/75     Pulse Rate 06/29/17 1113 76     Resp 06/29/17 1113 16     Temp 06/29/17 1113 98.8 F (37.1 C)     Temp Source 06/29/17 1113 Oral     SpO2 06/29/17 1113 100 %     Pain Score 06/29/17 1212 0   Constitutional: Alert with some confusion regarding date/time. Well appearing and in no acute distress. Eyes: Conjunctivae are normal. PERRL. Head: Atraumatic. Nose: No congestion/rhinnorhea. Mouth/Throat: Mucous membranes are slightly dry.  Neck: No stridor.  Cardiovascular: Normal rate, regular rhythm. Good peripheral circulation. Grossly normal heart sounds.   Respiratory: Normal respiratory effort.  No retractions. Lungs CTAB. Gastrointestinal: Soft  and nontender. No distention.  Musculoskeletal: No lower extremity tenderness with 3+ pitting edema in the legs bilaterally. No gross deformities of extremities. Neurologic:  Normal speech and language. Dozing off during exam at times but awakens easily to provide answers to questions. No gross focal neurologic deficits are appreciated. CN 2-12 normal.  Skin:  Skin is warm, dry and intact. No rash noted.  ____________________________________________   LABS (all labs ordered are listed, but only abnormal results are displayed)  Labs Reviewed  COMPREHENSIVE METABOLIC PANEL - Abnormal; Notable for the following components:      Result Value   CO2 21 (*)    Glucose, Bld 193 (*)    BUN 35 (*)    Creatinine, Ser 2.16 (*)    Total Protein 6.4 (*)    Albumin 2.8 (*)    Alkaline Phosphatase 162 (*)    GFR calc non Af Amer 25 (*)    GFR calc Af Amer 29 (*)    All other components within  normal limits  CBC - Abnormal; Notable for the following components:   RBC 3.00 (*)    Hemoglobin 10.3 (*)    HCT 30.0 (*)    MCH 34.3 (*)    All other components within normal limits  BRAIN NATRIURETIC PEPTIDE - Abnormal; Notable for the following components:   B Natriuretic Peptide 256.0 (*)    All other components within normal limits  URINALYSIS, ROUTINE W REFLEX MICROSCOPIC - Abnormal; Notable for the following components:   Color, Urine STRAW (*)    Protein, ur 100 (*)    Bacteria, UA RARE (*)    All other components within normal limits  CBG MONITORING, ED - Abnormal; Notable for the following components:   Glucose-Capillary 107 (*)    All other components within normal limits  URINE CULTURE  I-STAT TROPONIN, ED   ____________________________________________  EKG   EKG Interpretation  Date/Time:  Wednesday June 29 2017 11:07:36 EDT Ventricular Rate:  73 PR Interval:  232 QRS Duration: 74 QT Interval:  388 QTC Calculation: 427 R Axis:   -30 Text Interpretation:  Sinus rhythm with 1st degree A-V block Possible Left atrial enlargement Left axis deviation Left ventricular hypertrophy Nonspecific ST and T wave abnormality Abnormal ECG No STEMI.  Confirmed by Nanda Quinton [phone]) on 06/29/2017 2:23:35 PM       ____________________________________________  RADIOLOGY  Dg Chest 2 View  Result Date: 06/29/2017 CLINICAL DATA:  84 year old male with a history of altered mental status EXAM: CHEST - 2 VIEW COMPARISON:  06/14/2017, CT 02/15/2017 FINDINGS: Cardiomediastinal silhouette unchanged in size and contour. No evidence of central vascular congestion. No pneumothorax. Similar appearance of chronic interstitial opacities. No confluent airspace disease. Calcifications of the aortic arch. No acute displaced fracture. IMPRESSION: Chronic lung changes without evidence of acute superimposed cardiopulmonary disease. Electronically Signed   By: Corrie Mckusick D.O.   On: 06/29/2017  13:43   Ct Head Wo Contrast  Result Date: 06/29/2017 CLINICAL DATA:  Metabolic encephalopathy. Increasing fatigue and altered mental status. EXAM: CT HEAD WITHOUT CONTRAST TECHNIQUE: Contiguous axial images were obtained from the base of the skull through the vertex without intravenous contrast. COMPARISON:  Head CT scan and brain MRI 06/14/2017. FINDINGS: Brain: No evidence of acute infarction, hemorrhage, hydrocephalus, extra-axial collection or mass lesion/mass effect. Atrophy and chronic microvascular ischemic change are again seen. Vascular: Extensive atherosclerosis is noted. Skull: Intact. Sinuses/Orbits: Negative. Other: None. IMPRESSION: No acute abnormality. Atrophy and chronic microvascular ischemic change. Atherosclerosis. Electronically  Signed   By: Inge Rise M.D.   On: 06/29/2017 14:12    ____________________________________________   PROCEDURES  Procedure(s) performed:   Procedures  None ____________________________________________   INITIAL IMPRESSION / ASSESSMENT AND PLAN / ED COURSE  Pertinent labs & imaging results that were available during my care of the patient were reviewed by me and considered in my medical decision making (see chart for details).  Patient presents to the emergency department for evaluation of acute on chronic fatigue, sleepiness, confusion.  Labs reviewed from triage which show baseline CKD.  UA is pending.  Family describes several falls recently.  Plan to obtain CT imaging of the head to rule out subacute subdural.  Also will obtain chest x-ray given the patient's worsening lower extremity edema but he does not have any hypoxemia or other clinical signs to make me suspect pneumonia. No focal neurological deficits on exam.   04:22 PM Labs and imaging reviewed. No acute abnormality. CKD near baseline. No UTI. CT head and CXR are unremarkable. Patient remains awake and alert here. Discussed labs and impression with sister and patient in  detail. Patient had a recent MRI for these symptoms which was normal. No indication for admission at this time. Patient has home health aids at home TID. Sister does not feel that she needs additional assistance at home at this time. They will f/u with the PCP in the coming week and return to the ED with any new/worsening symptoms. D/C after IVF bolus complete.   At this time, I do not feel there is any life-threatening condition present. I have reviewed and discussed all results (EKG, imaging, lab, urine as appropriate), exam findings with patient. I have reviewed nursing notes and appropriate previous records.  I feel the patient is safe to be discharged home without further emergent workup. Discussed usual and customary return precautions. Patient and family (if present) verbalize understanding and are comfortable with this plan.  Patient will follow-up with their primary care provider. If they do not have a primary care provider, information for follow-up has been provided to them. All questions have been answered.  ____________________________________________  FINAL CLINICAL IMPRESSION(S) / ED DIAGNOSES  Final diagnoses:  Altered mental status, unspecified altered mental status type  Dehydration    MEDICATIONS GIVEN DURING THIS VISIT:  Medications  sodium chloride 0.9 % bolus 500 mL (0 mLs Intravenous Stopped 06/29/17 1752)    Note:  This document was prepared using Dragon voice recognition software and may include unintentional dictation errors.  Nanda Quinton, MD Emergency Medicine    Arch Methot, [facility], MD 06/29/17 Pauline Aus

## 2017-06-29 NOTE — ED Notes (Signed)
IV TEAM AT BEDSIDE 

## 2017-06-29 NOTE — ED Notes (Signed)
IV attempt x2.

## 2017-06-30 LAB — URINE CULTURE
Culture: NO GROWTH
Special Requests: NORMAL

## 2017-07-18 DIAGNOSIS — N390 Urinary tract infection, site not specified: Secondary | ICD-10-CM

## 2017-07-18 HISTORY — DX: Urinary tract infection, site not specified: N39.0

## 2017-08-02 ENCOUNTER — Emergency Department (HOSPITAL_COMMUNITY): Payer: Medicare Other

## 2017-08-02 ENCOUNTER — Inpatient Hospital Stay (HOSPITAL_COMMUNITY)
Admission: EM | Admit: 2017-08-02 | Discharge: 2017-08-05 | DRG: 689 | Disposition: A | Discharge status: Skilled Nursing Facility | Payer: Medicare Other | Attending: Family Medicine | Admitting: Family Medicine

## 2017-08-02 ENCOUNTER — Encounter (HOSPITAL_COMMUNITY): Payer: Self-pay | Admitting: Emergency Medicine

## 2017-08-02 DIAGNOSIS — Z66 Do not resuscitate: Secondary | ICD-10-CM | POA: Diagnosis present

## 2017-08-02 DIAGNOSIS — G40909 Epilepsy, unspecified, not intractable, without status epilepticus: Secondary | ICD-10-CM | POA: Diagnosis present

## 2017-08-02 DIAGNOSIS — Z8701 Personal history of pneumonia (recurrent): Secondary | ICD-10-CM | POA: Diagnosis not present

## 2017-08-02 DIAGNOSIS — E1043 Type 1 diabetes mellitus with diabetic autonomic (poly)neuropathy: Secondary | ICD-10-CM | POA: Diagnosis present

## 2017-08-02 DIAGNOSIS — Z79899 Other long term (current) drug therapy: Secondary | ICD-10-CM | POA: Diagnosis not present

## 2017-08-02 DIAGNOSIS — N183 Chronic kidney disease, stage 3 unspecified: Secondary | ICD-10-CM | POA: Diagnosis present

## 2017-08-02 DIAGNOSIS — R410 Disorientation, unspecified: Secondary | ICD-10-CM

## 2017-08-02 DIAGNOSIS — Z885 Allergy status to narcotic agent status: Secondary | ICD-10-CM | POA: Diagnosis not present

## 2017-08-02 DIAGNOSIS — E785 Hyperlipidemia, unspecified: Secondary | ICD-10-CM | POA: Diagnosis present

## 2017-08-02 DIAGNOSIS — I129 Hypertensive chronic kidney disease with stage 1 through stage 4 chronic kidney disease, or unspecified chronic kidney disease: Secondary | ICD-10-CM | POA: Diagnosis present

## 2017-08-02 DIAGNOSIS — N179 Acute kidney failure, unspecified: Secondary | ICD-10-CM | POA: Diagnosis not present

## 2017-08-02 DIAGNOSIS — G9341 Metabolic encephalopathy: Secondary | ICD-10-CM

## 2017-08-02 DIAGNOSIS — E538 Deficiency of other specified B group vitamins: Secondary | ICD-10-CM | POA: Diagnosis present

## 2017-08-02 DIAGNOSIS — R4182 Altered mental status, unspecified: Secondary | ICD-10-CM | POA: Diagnosis present

## 2017-08-02 DIAGNOSIS — E1022 Type 1 diabetes mellitus with diabetic chronic kidney disease: Secondary | ICD-10-CM | POA: Diagnosis present

## 2017-08-02 DIAGNOSIS — N400 Enlarged prostate without lower urinary tract symptoms: Secondary | ICD-10-CM | POA: Diagnosis present

## 2017-08-02 DIAGNOSIS — B9620 Unspecified Escherichia coli [E. coli] as the cause of diseases classified elsewhere: Secondary | ICD-10-CM | POA: Diagnosis not present

## 2017-08-02 DIAGNOSIS — E1065 Type 1 diabetes mellitus with hyperglycemia: Secondary | ICD-10-CM | POA: Diagnosis present

## 2017-08-02 DIAGNOSIS — Z794 Long term (current) use of insulin: Secondary | ICD-10-CM | POA: Diagnosis not present

## 2017-08-02 DIAGNOSIS — I160 Hypertensive urgency: Secondary | ICD-10-CM | POA: Diagnosis present

## 2017-08-02 DIAGNOSIS — R569 Unspecified convulsions: Secondary | ICD-10-CM | POA: Diagnosis not present

## 2017-08-02 DIAGNOSIS — E860 Dehydration: Secondary | ICD-10-CM | POA: Diagnosis present

## 2017-08-02 DIAGNOSIS — N3000 Acute cystitis without hematuria: Secondary | ICD-10-CM | POA: Diagnosis not present

## 2017-08-02 DIAGNOSIS — K219 Gastro-esophageal reflux disease without esophagitis: Secondary | ICD-10-CM | POA: Diagnosis present

## 2017-08-02 DIAGNOSIS — M1990 Unspecified osteoarthritis, unspecified site: Secondary | ICD-10-CM | POA: Diagnosis present

## 2017-08-02 DIAGNOSIS — Z8673 Personal history of transient ischemic attack (TIA), and cerebral infarction without residual deficits: Secondary | ICD-10-CM

## 2017-08-02 DIAGNOSIS — Z833 Family history of diabetes mellitus: Secondary | ICD-10-CM | POA: Diagnosis not present

## 2017-08-02 DIAGNOSIS — Z8042 Family history of malignant neoplasm of prostate: Secondary | ICD-10-CM

## 2017-08-02 DIAGNOSIS — E1039 Type 1 diabetes mellitus with other diabetic ophthalmic complication: Secondary | ICD-10-CM | POA: Diagnosis present

## 2017-08-02 DIAGNOSIS — Z86718 Personal history of other venous thrombosis and embolism: Secondary | ICD-10-CM

## 2017-08-02 DIAGNOSIS — IMO0002 Reserved for concepts with insufficient information to code with codable children: Secondary | ICD-10-CM | POA: Diagnosis present

## 2017-08-02 DIAGNOSIS — N390 Urinary tract infection, site not specified: Secondary | ICD-10-CM | POA: Diagnosis not present

## 2017-08-02 DIAGNOSIS — F0390 Unspecified dementia without behavioral disturbance: Secondary | ICD-10-CM | POA: Diagnosis present

## 2017-08-02 HISTORY — DX: Urinary tract infection, site not specified: N39.0

## 2017-08-02 LAB — CBC
HEMATOCRIT: 31.8 % — AB (ref 39.0–52.0)
Hemoglobin: 10.9 g/dL — ABNORMAL LOW (ref 13.0–17.0)
MCH: 33.9 pg (ref 26.0–34.0)
MCHC: 34.3 g/dL (ref 30.0–36.0)
MCV: 98.8 fL (ref 78.0–100.0)
Platelets: 233 10*3/uL (ref 150–400)
RBC: 3.22 MIL/uL — ABNORMAL LOW (ref 4.22–5.81)
RDW: 13 % (ref 11.5–15.5)
WBC: 6.1 10*3/uL (ref 4.0–10.5)

## 2017-08-02 LAB — URINALYSIS, ROUTINE W REFLEX MICROSCOPIC
Bilirubin Urine: NEGATIVE
Glucose, UA: NEGATIVE mg/dL
Ketones, ur: NEGATIVE mg/dL
Nitrite: POSITIVE — AB
PH: 5.5 (ref 5.0–8.0)
Protein, ur: >300 mg/dL — AB

## 2017-08-02 LAB — COMPREHENSIVE METABOLIC PANEL
ALT: 21 U/L (ref 17–63)
AST: 25 U/L (ref 15–41)
Albumin: 3 g/dL — ABNORMAL LOW (ref 3.5–5.0)
Alkaline Phosphatase: 160 U/L — ABNORMAL HIGH (ref 38–126)
Anion gap: 10 (ref 5–15)
BILIRUBIN TOTAL: 0.6 mg/dL (ref 0.3–1.2)
BUN: 36 mg/dL — AB (ref 6–20)
CO2: 22 mmol/L (ref 22–32)
CREATININE: 1.92 mg/dL — AB (ref 0.61–1.24)
Calcium: 10.2 mg/dL (ref 8.9–10.3)
Chloride: 107 mmol/L (ref 101–111)
GFR calc Af Amer: 33 mL/min — ABNORMAL LOW (ref >60)
GFR calc non Af Amer: 29 mL/min — ABNORMAL LOW (ref >60)
GLUCOSE: 130 mg/dL — AB (ref 65–99)
POTASSIUM: 3.8 mmol/L (ref 3.5–5.1)
Sodium: 139 mmol/L (ref 135–145)
Total Protein: 7.3 g/dL (ref 6.5–8.1)

## 2017-08-02 LAB — URINALYSIS, MICROSCOPIC (REFLEX): SQUAMOUS EPITHELIAL / LPF: NONE SEEN

## 2017-08-02 LAB — I-STAT CG4 LACTIC ACID, ED: Lactic Acid, Venous: 1.9 mmol/L (ref 0.5–1.9)

## 2017-08-02 LAB — CBG MONITORING, ED: Glucose-Capillary: 135 mg/dL — ABNORMAL HIGH (ref 65–99)

## 2017-08-02 MED ORDER — CLONIDINE HCL 0.1 MG PO TABS
0.1000 mg | ORAL_TABLET | Freq: Two times a day (BID) | ORAL | Status: DC
  Administered 2017-08-02 – 2017-08-05 (×6): 0.1 mg via ORAL
  Filled 2017-08-02 (×6): qty 1

## 2017-08-02 MED ORDER — AMLODIPINE BESYLATE 10 MG PO TABS
10.0000 mg | ORAL_TABLET | Freq: Every evening | ORAL | Status: DC
  Administered 2017-08-03 – 2017-08-04 (×2): 10 mg via ORAL
  Filled 2017-08-02 (×3): qty 1

## 2017-08-02 MED ORDER — HYDRALAZINE HCL 20 MG/ML IJ SOLN
10.0000 mg | INTRAMUSCULAR | Status: AC
  Administered 2017-08-02: 10 mg via INTRAVENOUS
  Filled 2017-08-02: qty 1

## 2017-08-02 MED ORDER — INSULIN GLARGINE 100 UNIT/ML ~~LOC~~ SOLN
25.0000 [IU] | Freq: Every morning | SUBCUTANEOUS | Status: DC
  Administered 2017-08-03 – 2017-08-05 (×3): 25 [IU] via SUBCUTANEOUS
  Filled 2017-08-02 (×4): qty 0.25

## 2017-08-02 MED ORDER — INSULIN ASPART 100 UNIT/ML ~~LOC~~ SOLN
0.0000 [IU] | Freq: Three times a day (TID) | SUBCUTANEOUS | Status: DC
  Administered 2017-08-03: 2 [IU] via SUBCUTANEOUS
  Administered 2017-08-03: 3 [IU] via SUBCUTANEOUS
  Administered 2017-08-03: 2 [IU] via SUBCUTANEOUS
  Filled 2017-08-02: qty 1

## 2017-08-02 MED ORDER — ONDANSETRON HCL 4 MG/2ML IJ SOLN: 4.0000 mg | Freq: Four times a day (QID) | INTRAMUSCULAR | Status: DC | PRN

## 2017-08-02 MED ORDER — LATANOPROST 0.005 % OP SOLN
1.0000 [drp] | Freq: Every day | OPHTHALMIC | Status: DC
  Administered 2017-08-03 – 2017-08-05 (×2): 1 [drp] via OPHTHALMIC
  Filled 2017-08-02 (×2): qty 2.5

## 2017-08-02 MED ORDER — HYDROXYZINE HCL 25 MG PO TABS: 25.0000 mg | ORAL_TABLET | Freq: Three times a day (TID) | ORAL | Status: DC | PRN

## 2017-08-02 MED ORDER — METOPROLOL TARTRATE 5 MG/5ML IV SOLN
5.0000 mg | Freq: Once | INTRAVENOUS | Status: AC
  Administered 2017-08-02: 5 mg via INTRAVENOUS
  Filled 2017-08-02: qty 5

## 2017-08-02 MED ORDER — DONEPEZIL HCL 10 MG PO TABS
10.0000 mg | ORAL_TABLET | Freq: Every day | ORAL | Status: DC
  Administered 2017-08-03 – 2017-08-04 (×3): 10 mg via ORAL
  Filled 2017-08-02 (×3): qty 1

## 2017-08-02 MED ORDER — HYDRALAZINE HCL 20 MG/ML IJ SOLN: 5.0000 mg | INTRAMUSCULAR | Status: DC | PRN

## 2017-08-02 MED ORDER — CLONIDINE HCL 0.1 MG PO TABS
0.1000 mg | ORAL_TABLET | Freq: Once | ORAL | Status: AC
  Administered 2017-08-02: 0.1 mg via ORAL
  Filled 2017-08-02: qty 1

## 2017-08-02 MED ORDER — SODIUM CHLORIDE 0.9 % IV SOLN
INTRAVENOUS | Status: DC
  Administered 2017-08-02 – 2017-08-04 (×3): via INTRAVENOUS

## 2017-08-02 MED ORDER — PHENYTOIN SODIUM EXTENDED 100 MG PO CAPS
300.0000 mg | ORAL_CAPSULE | Freq: Every day | ORAL | Status: DC
  Administered 2017-08-02 – 2017-08-04 (×3): 300 mg via ORAL
  Filled 2017-08-02 (×3): qty 3

## 2017-08-02 MED ORDER — POLYETHYLENE GLYCOL 3350 17 G PO PACK: 17.0000 g | PACK | Freq: Every day | ORAL | Status: DC | PRN

## 2017-08-02 MED ORDER — CEFTRIAXONE SODIUM 1 G IJ SOLR
1.0000 g | Freq: Once | INTRAMUSCULAR | Status: AC
  Administered 2017-08-02: 1 g via INTRAVENOUS
  Filled 2017-08-02: qty 10

## 2017-08-02 MED ORDER — ACETAMINOPHEN 325 MG PO TABS: 650.0000 mg | ORAL_TABLET | Freq: Four times a day (QID) | ORAL | Status: DC | PRN

## 2017-08-02 MED ORDER — ACETAMINOPHEN 650 MG RE SUPP: 650.0000 mg | Freq: Four times a day (QID) | RECTAL | Status: DC | PRN

## 2017-08-02 MED ORDER — HEPARIN SODIUM (PORCINE) 5000 UNIT/ML IJ SOLN
5000.0000 [IU] | Freq: Three times a day (TID) | INTRAMUSCULAR | Status: DC
  Administered 2017-08-03 – 2017-08-05 (×8): 5000 [IU] via SUBCUTANEOUS
  Filled 2017-08-02 (×7): qty 1

## 2017-08-02 MED ORDER — ONDANSETRON HCL 4 MG PO TABS: 4.0000 mg | ORAL_TABLET | Freq: Four times a day (QID) | ORAL | Status: DC | PRN

## 2017-08-02 MED ORDER — SODIUM CHLORIDE 0.9 % IV BOLUS
1000.0000 mL | Freq: Once | INTRAVENOUS | Status: AC
  Administered 2017-08-02: 1000 mL via INTRAVENOUS

## 2017-08-02 MED ORDER — LORATADINE 10 MG PO TABS
10.0000 mg | ORAL_TABLET | Freq: Every day | ORAL | Status: DC
  Administered 2017-08-03 – 2017-08-05 (×3): 10 mg via ORAL
  Filled 2017-08-02 (×4): qty 1

## 2017-08-02 MED ORDER — HYDRALAZINE HCL 50 MG PO TABS
75.0000 mg | ORAL_TABLET | Freq: Three times a day (TID) | ORAL | Status: DC
  Administered 2017-08-02 – 2017-08-05 (×9): 75 mg via ORAL
  Filled 2017-08-02 (×5): qty 1
  Filled 2017-08-02: qty 3
  Filled 2017-08-02 (×3): qty 1

## 2017-08-02 MED ORDER — LISINOPRIL 20 MG PO TABS
20.0000 mg | ORAL_TABLET | Freq: Every day | ORAL | Status: DC
  Administered 2017-08-03: 20 mg via ORAL
  Filled 2017-08-02 (×2): qty 1

## 2017-08-02 MED ORDER — SODIUM CHLORIDE 0.9 % IV SOLN
INTRAVENOUS | Status: DC
  Administered 2017-08-02: 21:00:00 via INTRAVENOUS

## 2017-08-02 MED ORDER — SODIUM CHLORIDE 0.9 % IV SOLN
1.0000 g | INTRAVENOUS | Status: DC
  Administered 2017-08-03 – 2017-08-04 (×2): 1 g via INTRAVENOUS
  Filled 2017-08-02: qty 10
  Filled 2017-08-02: qty 1

## 2017-08-02 MED ORDER — METOPROLOL TARTRATE 5 MG/5ML IV SOLN: 10.0000 mg | Freq: Once | INTRAVENOUS | Status: DC

## 2017-08-02 MED ORDER — PANTOPRAZOLE SODIUM 40 MG PO TBEC
40.0000 mg | DELAYED_RELEASE_TABLET | Freq: Every day | ORAL | Status: DC
  Administered 2017-08-03 – 2017-08-05 (×3): 40 mg via ORAL
  Filled 2017-08-02 (×3): qty 1

## 2017-08-02 MED ORDER — METOPROLOL SUCCINATE ER 25 MG PO TB24
50.0000 mg | ORAL_TABLET | Freq: Every day | ORAL | Status: DC
  Administered 2017-08-03 – 2017-08-05 (×4): 50 mg via ORAL
  Filled 2017-08-02 (×2): qty 2
  Filled 2017-08-02: qty 1
  Filled 2017-08-02 (×2): qty 2

## 2017-08-02 MED ORDER — LABETALOL HCL 5 MG/ML IV SOLN
10.0000 mg | Freq: Once | INTRAVENOUS | Status: DC
  Administered 2017-08-02: 10 mg via INTRAVENOUS
  Filled 2017-08-02: qty 4

## 2017-08-02 MED ORDER — INSULIN ASPART 100 UNIT/ML ~~LOC~~ SOLN: 0.0000 [IU] | Freq: Every day | SUBCUTANEOUS | Status: DC

## 2017-08-02 NOTE — ED Provider Notes (Signed)
[facility] EMERGENCY DEPARTMENT Provider Note   CSN: [id number] Arrival date & time: 08/02/17  1904     History   Chief Complaint Chief Complaint  Patient presents with  . Altered Mental Status    HPI Eric [name] is a 84 y.o. male.  HPI Presents from home with concern of altered mental status. Patient is alone, confused, perseverating, level 5 caveat secondary to altered mental status. EMS reports, on their arrival, the patient was reportedly acting differently over the past day, possibly as long as 2 days, with no report of fall, vomiting, diarrhea by people at the patient's apartment. Patient herself answers questions briefly, but only inconsistently appropriately, does seemingly deny pain, but is not oriented to person, place, time.  Past Medical History:  Diagnosis Date  . Adult failure to thrive   . Altered mental status   . ANEMIA-NOS 02/17/2007  . Anxiety   . BACK PAIN, LUMBAR   . BENIGN PROSTATIC HYPERTROPHY   . Bilateral leg edema   . CAP (community acquired pneumonia)   . CKD (chronic kidney disease)   . DEPRESSION 12/25/2008  . Diabetes mellitus   . DIABETES MELLITUS, TYPE I, CONTROLLED, WITH RETINOPATHY   . Encephalopathy, metabolic   . Gastroparesis   . GERD 02/17/2007  . Hyperlipidemia   . HYPERTENSION 02/17/2007  . Hyponatremia   . LIVER DISORDER   . OSTEOARTHRITIS 03/04/2010  . Pneumonia   . PSA, INCREASED   . Stroke ([place])   . VITAMIN B12 DEFICIENCY     Patient Active Problem List   Diagnosis Date Noted  . HCAP (healthcare-associated pneumonia)   . Severe sepsis ([place])   . Sepsis due to pneumonia ([place]) 10/30/2016  . History of DVT (deep vein thrombosis) 10/30/2016  . UTI (urinary tract infection) 10/03/2016  . Memory loss 06/22/2016  . AKI (acute kidney injury) ([place]) 11/12/2014  . TIA (transient ischemic attack) 11/09/2014  . Seizure ([place]) 02/08/2012  . Lesion of lung 02/08/2012  . DM2 (diabetes mellitus, type 2)  ([place]) 02/08/2012  . Hypertensive encephalopathy 01/06/2012  . Hyperglycemia 01/06/2012  . MGUS (monoclonal gammopathy of unknown significance) 10/28/2011  . Bilateral leg edema 10/28/2011  . Fall at home 10/28/2011  . Failure to thrive in adult 10/28/2011  . Type 2 diabetes mellitus with hypoglycemia ([place]) 10/27/2011  . Dysphagia 10/27/2011  . Encephalopathy 10/27/2011  . Hypernatremia 10/24/2011  . Altered mental status 10/20/2011  . Generalized weakness 10/18/2011  . Encephalopathy, metabolic   . Dependent edema 10/02/2011  . Hyponatremia 10/02/2011  . Pneumonia 04/24/2011  . CKD (chronic kidney disease) stage 3, GFR 30-59 ml/min ([place]) 04/19/2011  . Bronchitis, acute 04/16/2011  . Edema 03/30/2011  . OSTEOARTHRITIS 03/04/2010  . LIVER DISORDER 12/09/2009  . BACK PAIN, LUMBAR 09/09/2009  . ABDOMINAL PAIN, UNSPECIFIED SITE 01/31/2009  . SYNCOPE 01/08/2009  . ELBOW PAIN, LEFT 12/30/2008  . ABNORMAL ELECTROCARDIOGRAM 12/30/2008  . DEPRESSION 12/25/2008  . WEIGHT LOSS 12/25/2008  . NOSEBLEED 12/25/2008  . VITAMIN B12 DEFICIENCY 10/24/2008  . DM (diabetes mellitus), type 1, uncontrolled w/ophthalmic complication ([place]) 58/52/7782  . PANCYTOPENIA 10/08/2008  . PSA, INCREASED 10/08/2008  . DYSURIA 10/03/2008  . LEG PAIN, BILATERAL 12/26/2007  . KNEE PAIN, LEFT 08/18/2007  . COUGH 04/18/2007  . Hyperlipidemia 02/17/2007  . ANEMIA-NOS 02/17/2007  . Essential hypertension 02/17/2007  . ALLERGIC RHINITIS 02/17/2007  . GERD 02/17/2007  . GASTROPARESIS 02/17/2007  . Benign prostatic hyperplasia 02/17/2007  . HYPERURICEMIA 02/17/2007    Past  Surgical History:  Procedure Laterality Date  . LEFT HEART CATHETERIZATION WITH CORONARY ANGIOGRAM N/A 04/23/2011   Procedure: LEFT HEART CATHETERIZATION WITH CORONARY ANGIOGRAM;  Surgeon: Peter M Martinique, MD;  Location: [facility] CATH LAB;  Service: Cardiovascular;  Laterality: N/A;  . TRANSURETHRAL RESECTION OF PROSTATE          Home  Medications    Prior to Admission medications   Medication Sig Start Date End Date Taking? Authorizing Provider  ACCU-CHEK AVIVA PLUS test strip  05/01/14   [provider]  acetaminophen (TYLENOL) 500 MG tablet Take 500 mg by mouth 2 (two) times daily. Reported on 06/19/2015    [provider]  amLODipine (NORVASC) 10 MG tablet Take 10 mg by mouth every evening.  05/26/16   [provider]  atorvastatin (LIPITOR) 20 MG tablet Take 1 tablet (20 mg total) by mouth daily at 6 PM. Patient not taking: Reported on 08/02/2017 11/13/14   Charlynne Cousins, MD  B-D UF III MINI PEN NEEDLES 31G X 5 MM MISC  06/20/14   [provider]  calamine lotion Apply 1 application topically 2 (two) times daily as needed for itching (to shoulders/ back).    [provider]  calcitRIOL (ROCALTROL) 0.25 MCG capsule Take 0.25 mcg by mouth daily.  12/26/11   [provider]  cholecalciferol (VITAMIN D) 1000 UNITS tablet Take 2,000 Units by mouth daily.     [provider]  cloNIDine (CATAPRES) 0.1 MG tablet Take 0.1 mg by mouth 2 (two) times daily.  05/03/16   [provider]  diclofenac sodium (VOLTAREN) 1 % GEL Apply 2 g topically 4 (four) times daily. 03/08/17   Leandrew Koyanagi, MD  docusate sodium (COLACE) 100 MG capsule Take 100 mg by mouth daily.    [provider]  donepezil (ARICEPT) 10 MG tablet Take 1 tablet (10 mg total) by mouth at bedtime. 06/22/16   Dennie Bible, NP  doxycycline (ADOXA) 100 MG tablet Take 1 tablet (100 mg total) by mouth 2 (two) times daily. Patient not taking: Reported on 06/14/2017 11/02/16   Debbe Odea, MD  fluticasone [facility]) 50 MCG/ACT nasal spray Place 1 spray into both nostrils 2 (two) times daily. Patient taking differently: Place 1 spray into both nostrils 2 (two) times daily as needed for allergies.  10/04/16   Doreatha Lew, MD  hydrALAZINE (APRESOLINE) 25 MG tablet Take 75 mg by mouth 3 (three)  times daily.  10/22/14   [provider]  hydrOXYzine (ATARAX/VISTARIL) 10 MG tablet TAKE 1 TABLET BY MOUTH TWICE DAILY AS NEEDED FOR ITCHING 12/10/14   [provider]  insulin aspart (NOVOLOG FLEXPEN) 100 UNIT/ML FlexPen Inject 0-5 Units into the skin 3 (three) times daily as needed for high blood sugar (sliding scale).    [provider]  Insulin Glargine (LANTUS SOLOSTAR) 100 UNIT/ML Solostar Pen Inject 25 Units into the skin every morning.     [provider]  latanoprost (XALATAN) 0.005 % ophthalmic solution Place 1 drop into both eyes at bedtime. 06/11/17   [provider]  lisinopril (PRINIVIL,ZESTRIL) 20 MG tablet Take 20 mg by mouth daily. 06/30/17   [provider]  lisinopril (PRINIVIL,ZESTRIL) 40 MG tablet Take 1 tablet (40 mg total) by mouth 2 (two) times daily. Patient taking differently: Take 20 mg by mouth daily.  11/17/14   Charlynne Cousins, MD  loratadine (CLARITIN) 10 MG tablet Take 10 mg by mouth daily with lunch.    [provider]  LUMIGAN 0.01 % SOLN Place 1 drop into both eyes at bedtime. 08/03/16   [provider]  metoprolol succinate (TOPROL-XL) 50 MG 24 hr tablet Take 50 mg by mouth daily. 06/30/17   [provider]  OVER THE COUNTER MEDICATION Apply 1 application topically 4 (four) times daily as needed (clotrimazole= to the buttocks for rash as needed).     [provider]  pantoprazole (PROTONIX) 40 MG tablet Take 40 mg by mouth daily.  05/04/15   [provider]  phenytoin (DILANTIN) 100 MG ER capsule Take 300 mg by mouth at bedtime.    [provider]  Polyethyl Glycol-Propyl Glycol (SYSTANE ULTRA) 0.4-0.3 % SOLN Place 1 drop into both eyes 3 (three) times daily as needed (dry eyes).    [provider]  polyethylene glycol powder (GLYCOLAX/MIRALAX) powder Take 17 g by mouth daily as needed for mild constipation.  05/18/14   [provider]    Probiotic Product (PROBIOTIC-10 PO) Take 1 capsule by mouth daily.    [provider]  senna (SENOKOT) 8.6 MG TABS Take 1 tablet by mouth daily.    [provider]  Triamcinolone Acetonide (NASACORT AQ NA) Place 1 spray into both nostrils daily as needed (for allergies).     [provider]    Family History Family History  Problem Relation Age of Onset  . Cancer Father        had uncertain type of cancer  . Cancer Brother        Prostate Cancer  . Cancer Brother        Prostate Cancer  . Diabetes Other     Social History Social History   Tobacco Use  . Smoking status: Never Smoker  . Smokeless tobacco: Never Used  Substance Use Topics  . Alcohol use: No  . Drug use: No     Allergies   Percocet [oxycodone-acetaminophen]   Review of Systems Review of Systems  Unable to perform ROS: Mental status change     Physical Exam Updated Vital Signs BP (!) 223/111   Pulse 91   Temp 98.6 F (37 C) (Oral)   Resp 17   Ht 6' (1.829 m)   Wt 77.1 kg (170 lb)   SpO2 95%   BMI 23.06 kg/m   Physical Exam  Constitutional: He has a sickly appearance.  HENT:  Head: Normocephalic and atraumatic.  Eyes: Conjunctivae and EOM are normal.  Cardiovascular: Normal rate and regular rhythm.  Pulmonary/Chest: Effort normal. No stridor. No respiratory distress.  Abdominal: He exhibits no distension.  Musculoskeletal: He exhibits no edema.  Neurological: He displays atrophy. He displays no tremor. He displays no seizure activity.  Skin: Skin is warm and dry.  Psychiatric: He is slowed and withdrawn. Cognition and memory are impaired.  Nursing note and vitals reviewed.    ED Treatments / Results  Labs (all labs ordered are listed, but only abnormal results are displayed) Labs Reviewed  COMPREHENSIVE METABOLIC PANEL - Abnormal; Notable for the following components:      Result Value   Glucose, Bld 130 (*)    BUN 36 (*)    Creatinine, Ser 1.92 (*)     Albumin 3.0 (*)    Alkaline Phosphatase 160 (*)    GFR calc non Af Amer 29 (*)    GFR calc Af Amer 33 (*)    All other components within normal limits  CBC - Abnormal; Notable for the following components:  RBC 3.22 (*)    Hemoglobin 10.9 (*)    HCT 31.8 (*)    All other components within normal limits  URINALYSIS, ROUTINE W REFLEX MICROSCOPIC - Abnormal; Notable for the following components:   APPearance CLOUDY (*)    Specific Gravity, Urine >1.030 (*)    Hgb urine dipstick SMALL (*)    Protein, ur >300 (*)    Nitrite POSITIVE (*)    Leukocytes, UA SMALL (*)    All other components within normal limits  URINALYSIS, MICROSCOPIC (REFLEX) - Abnormal; Notable for the following components:   Bacteria, UA MANY (*)    All other components within normal limits  URINE CULTURE  I-STAT CG4 LACTIC ACID, ED  CBG MONITORING, ED    EKG EKG Interpretation  Date/Time:  Tuesday August 02 2017 19:16:27 EDT Ventricular Rate:  91 PR Interval:    QRS Duration: 94 QT Interval:  389 QTC Calculation: 479 R Axis:   -3 Text Interpretation:  Sinus tachycardia Artifact Abnormal ekg Confirmed by Carmin Muskrat [phone]) on 08/02/2017 7:20:09 PM   Radiology Ct Head Wo Contrast  Result Date: 08/02/2017 CLINICAL DATA:  84 year old male with a history of altered mental status EXAM: CT HEAD WITHOUT CONTRAST TECHNIQUE: Contiguous axial images were obtained from the base of the skull through the vertex without intravenous contrast. COMPARISON:  06/29/2017, 06/14/2017 FINDINGS: Brain: No acute intracranial hemorrhage. No midline shift or mass effect. Gray-white differentiation relatively maintained. Unremarkable configuration the ventricles. Mild volume loss. Vascular: Calcifications of the intracranial vasculature. Skull: No acute fracture. Sinuses/Orbits: Unremarkable appearance of the orbits. Other: None IMPRESSION: Head CT negative for acute intracranial abnormality. Similar appearance of volume loss.  Electronically Signed   By: Corrie Mckusick D.O.   On: 08/02/2017 20:29   Dg Chest Port 1 View  Result Date: 08/02/2017 CLINICAL DATA:  84 y/o  M; altered level of consciousness. EXAM: PORTABLE CHEST 1 VIEW COMPARISON:  06/29/2017 chest radiograph FINDINGS: Cardiac silhouette accentuated by low lung volumes and apical lordotic technique. Aortic atherosclerosis with calcification. Clear lungs. No pleural effusion or pneumothorax. No acute osseous abnormality is evident. IMPRESSION: No active disease. Electronically Signed   By: Kristine Garbe M.D.   On: 08/02/2017 20:15    Procedures Procedures (including critical care time)  Medications Ordered in ED Medications  sodium chloride 0.9 % bolus 1,000 mL (1,000 mLs Intravenous New Bag/Given 08/02/17 2044)    And  0.9 %  sodium chloride infusion ( Intravenous New Bag/Given 08/02/17 2044)  cefTRIAXone (ROCEPHIN) 1 g in sodium chloride 0.9 % 100 mL IVPB (has no administration in time range)  metoprolol tartrate (LOPRESSOR) injection 10 mg (has no administration in time range)  labetalol (NORMODYNE,TRANDATE) injection 10 mg (has no administration in time range)  hydrALAZINE (APRESOLINE) injection 10 mg (10 mg Intravenous Given 08/02/17 2050)  cloNIDine (CATAPRES) tablet 0.1 mg (0.1 mg Oral Given 08/02/17 2050)     Initial Impression / Assessment and Plan / ED Course  I have reviewed the triage vital signs and the nursing notes.  Pertinent labs & imaging results that were available during my care of the patient were reviewed by me and considered in my medical decision making (see chart for details).   His initial blood pressure 223/111, there is a history of hypertension, this is abnormal for him.  Has received IV hydralazine and clonidine after my initial evaluation.  Update:, Labs notable for urinary tract infection. Given the patient's altered mental status, as well as hypertension, he will require  admission for further evaluation and  management.  9:54 PM Patient remains hypertensive after initial clonidine, hydralazine. Labetalol ordered. On repeat exam he appears about the same, calm, though not oriented.  Sister now present, she notes that the patient typically takes medication as directed, but did not take any medicine today.  Final Clinical Impressions(s) / ED Diagnoses  Altered mental status UTI Hypertensive urgency  CRITICAL CARE Performed by: Carmin Muskrat Total critical care time: 35 minutes Critical care time was exclusive of separately billable procedures and treating other patients. Critical care was necessary to treat or prevent imminent or life-threatening deterioration. Critical care was time spent personally by me on the following activities: development of treatment plan with patient and/or surrogate as well as nursing, discussions with consultants, evaluation of patient's response to treatment, examination of patient, obtaining history from patient or surrogate, ordering and performing treatments and interventions, ordering and review of laboratory studies, ordering and review of radiographic studies, pulse oximetry and re-evaluation of patient's condition.    Carmin Muskrat, MD 08/02/17 2159

## 2017-08-02 NOTE — ED Notes (Signed)
Patient transported to CT 

## 2017-08-02 NOTE — H&P (Addendum)
PCP:   [name], MD   Chief Complaint:  Confusion  HPI: this is a 84 y/o who presents today was found to be poorly responsive to questions. He strayed in a fetal position all day with huis eyes closed.  When they tried to get him to do things he was combative. He  was took weak to walk. He was confused. He was warm to the touch. Yesterday he was his usual self, this was new as of this morning. He was taken to the ER. History provided by his sister Donnie Aho his [place]  [phone].   Review of Systems:  The patient denies anorexia, fever, weight loss, confusion, vision loss, decreased hearing, hoarseness, chest pain, syncope, dyspnea on exertion, peripheral edema, balance deficits, hemoptysis, abdominal pain, melena, hematochezia, severe indigestion/heartburn, hematuria, incontinence, genital sores, muscle weakness, suspicious skin lesions, transient blindness, difficulty walking, depression, unusual weight change, abnormal bleeding, enlarged lymph nodes, angioedema, and breast masses.  Past Medical History: Past Medical History:  Diagnosis Date  . Adult failure to thrive   . Altered mental status   . ANEMIA-NOS 02/17/2007  . Anxiety   . BACK PAIN, LUMBAR   . BENIGN PROSTATIC HYPERTROPHY   . Bilateral leg edema   . CAP (community acquired pneumonia)   . CKD (chronic kidney disease)   . DEPRESSION 12/25/2008  . Diabetes mellitus   . DIABETES MELLITUS, TYPE I, CONTROLLED, WITH RETINOPATHY   . Encephalopathy, metabolic   . Gastroparesis   . GERD 02/17/2007  . Hyperlipidemia   . HYPERTENSION 02/17/2007  . Hyponatremia   . LIVER DISORDER   . OSTEOARTHRITIS 03/04/2010  . Pneumonia   . PSA, INCREASED   . Stroke ([place])   . VITAMIN B12 DEFICIENCY    Past Surgical History:  Procedure Laterality Date  . LEFT HEART CATHETERIZATION WITH CORONARY ANGIOGRAM N/A 04/23/2011   Procedure: LEFT HEART CATHETERIZATION WITH CORONARY ANGIOGRAM;  Surgeon: Peter M Martinique, MD;  Location: [facility] CATH LAB;   Service: Cardiovascular;  Laterality: N/A;  . TRANSURETHRAL RESECTION OF PROSTATE      Medications: Prior to Admission medications   Medication Sig Start Date End Date Taking? Authorizing Provider  acetaminophen (TYLENOL) 500 MG tablet Take 500 mg by mouth 2 (two) times daily. Reported on 06/19/2015   Yes [provider]  amLODipine (NORVASC) 10 MG tablet Take 10 mg by mouth every evening.  05/26/16  Yes [provider]  calamine lotion Apply 1 application topically 2 (two) times daily as needed for itching (to shoulders/ back).   Yes [provider]  calcitRIOL (ROCALTROL) 0.25 MCG capsule Take 0.25 mcg by mouth daily.  12/26/11  Yes [provider]  cholecalciferol (VITAMIN D) 1000 UNITS tablet Take 2,000 Units by mouth daily.    Yes [provider]  cloNIDine (CATAPRES) 0.1 MG tablet Take 0.1 mg by mouth 2 (two) times daily.  05/03/16  Yes [provider]  diclofenac sodium (VOLTAREN) 1 % GEL Apply 2 g topically 4 (four) times daily. 03/08/17  Yes Leandrew Koyanagi, MD  docusate sodium (COLACE) 100 MG capsule Take 100 mg by mouth daily.   Yes [provider]  donepezil (ARICEPT) 10 MG tablet Take 1 tablet (10 mg total) by mouth at bedtime. 06/22/16  Yes Dennie Bible, NP  fluticasone [facility]) 50 MCG/ACT nasal spray Place 1 spray into both nostrils 2 (two) times daily. Patient taking differently: Place 1 spray into both nostrils 2 (two) times daily as needed for allergies.  10/04/16  Yes Patrecia Pour, Christean Grief, MD  hydrALAZINE (APRESOLINE) 25 MG tablet Take 75 mg by mouth 3 (three) times daily.  10/22/14  Yes [provider]  hydrOXYzine (ATARAX/VISTARIL) 10 MG tablet TAKE 1 TABLET BY MOUTH TWICE DAILY AS NEEDED FOR ITCHING 12/10/14  Yes [provider]  insulin aspart (NOVOLOG FLEXPEN) 100 UNIT/ML FlexPen Inject 0-5 Units into the skin 3 (three) times daily as needed for high blood sugar (sliding scale).   Yes [provider]  Insulin Glargine (LANTUS SOLOSTAR) 100 UNIT/ML Solostar Pen Inject 25 Units into the skin every morning.    Yes [provider]  latanoprost (XALATAN) 0.005 % ophthalmic solution Place 1 drop into both eyes at bedtime. 06/11/17  Yes [provider]  lisinopril (PRINIVIL,ZESTRIL) 20 MG tablet Take 20 mg by mouth daily. 06/30/17  Yes [provider]  loratadine (CLARITIN) 10 MG tablet Take 10 mg by mouth daily with lunch.   Yes [provider]  LUMIGAN 0.01 % SOLN Place 1 drop into both eyes at bedtime. 08/03/16  Yes [provider]  metoprolol succinate (TOPROL-XL) 50 MG 24 hr tablet Take 50 mg by mouth daily. 06/30/17  Yes [provider]  OVER THE COUNTER MEDICATION Apply 1 application topically 4 (four) times daily as needed (clotrimazole= to the buttocks for rash as needed).    Yes [provider]  pantoprazole (PROTONIX) 40 MG tablet Take 40 mg by mouth daily.  05/04/15  Yes [provider]  phenytoin (DILANTIN) 100 MG ER capsule Take 300 mg by mouth at bedtime.   Yes [provider]  Polyethyl Glycol-Propyl Glycol (SYSTANE ULTRA) 0.4-0.3 % SOLN Place 1 drop into both eyes 3 (three) times daily as needed (dry eyes).   Yes [provider]  polyethylene glycol powder (GLYCOLAX/MIRALAX) powder Take 17 g by mouth daily as needed for mild constipation.  05/18/14  Yes [provider]  Probiotic Product (PROBIOTIC-10 PO) Take 1 capsule by mouth daily.   Yes [provider]  senna (SENOKOT) 8.6 MG TABS Take 1 tablet by mouth daily.   Yes [provider]  Triamcinolone Acetonide (NASACORT AQ NA) Place 1 spray into both nostrils daily as needed (for allergies).    Yes [provider]  ACCU-CHEK AVIVA PLUS test strip  05/01/14   [provider]  atorvastatin (LIPITOR) 20 MG tablet Take 1 tablet (20 mg total) by mouth daily at 6 PM. Patient not taking: Reported on  08/02/2017 11/13/14   Charlynne Cousins, MD  B-D UF III MINI PEN NEEDLES 31G X 5 MM MISC  06/20/14   [provider]  doxycycline (ADOXA) 100 MG tablet Take 1 tablet (100 mg total) by mouth 2 (two) times daily. Patient not taking: Reported on 06/14/2017 11/02/16   Debbe Odea, MD  lisinopril (PRINIVIL,ZESTRIL) 40 MG tablet Take 1 tablet (40 mg total) by mouth 2 (two) times daily. Patient not taking: Reported on 08/02/2017 11/17/14   Charlynne Cousins, MD    Allergies:   Allergies  Allergen Reactions  . Percocet [Oxycodone-Acetaminophen] Rash    Social History:  reports that he has never smoked. He has never used smokeless tobacco. He reports that he does not drink alcohol or use drugs.  Family History: Family History  Problem Relation Age of Onset  . Cancer Father        had uncertain type of cancer  . Cancer Brother        Prostate Cancer  .  Cancer Brother        Prostate Cancer  . Diabetes Other     Physical Exam: Vitals:   08/02/17 2130 08/02/17 2145 08/02/17 2200 08/02/17 2208  BP: (!) 216/102 (!) 202/98 (!) 194/94 (!) 179/79  Pulse: 98 98 98 95  Resp: 16 13 19 19   Temp:      TempSrc:      SpO2: 98% 97% 99% 100%  Weight:      Height:        General:  Alert and oriented times three, well developed and nourished, no acute distress, weak Eyes: PERRLA, pink conjunctiva, no scleral icterus ENT: Moist oral mucosa, neck supple, no thyromegaly Lungs: clear to ascultation, no wheeze, no crackles, no use of accessory muscles Cardiovascular: regular rate and rhythm, no regurgitation, no gallops, no murmurs. No carotid bruits, no JVD Abdomen: soft, positive BS, non-tender, non-distended, no organomegaly, not an acute abdomen GU: not examined Neuro: CN II - XII grossly intact, sensation intact Musculoskeletal: strength 5/5 all extremities, no clubbing, cyanosis or edema Skin: no rash, no subcutaneous crepitation, no decubitus Psych: appropriate patient   Labs  on Admission:  Recent Labs    08/02/17 1929  NA 139  K 3.8  CL 107  CO2 22  GLUCOSE 130*  BUN 36*  CREATININE 1.92*  CALCIUM 10.2   Recent Labs    08/02/17 1929  AST 25  ALT 21  ALKPHOS 160*  BILITOT 0.6  PROT 7.3  ALBUMIN 3.0*   No results for input(s): LIPASE, AMYLASE in the last 72 hours. Recent Labs    08/02/17 1929  WBC 6.1  HGB 10.9*  HCT 31.8*  MCV 98.8  PLT 233   No results for input(s): CKTOTAL, CKMB, CKMBINDEX, TROPONINI in the last 72 hours. Invalid input(s): POCBNP No results for input(s): DDIMER in the last 72 hours. No results for input(s): HGBA1C in the last 72 hours. No results for input(s): CHOL, HDL, LDLCALC, TRIG, CHOLHDL, LDLDIRECT in the last 72 hours. No results for input(s): TSH, T4TOTAL, T3FREE, THYROIDAB in the last 72 hours.  Invalid input(s): FREET3 No results for input(s): VITAMINB12, FOLATE, FERRITIN, TIBC, IRON, RETICCTPCT in the last 72 hours.  Micro Results: Results for WOODARD, PERRELL (MRN [id number]) as of 08/02/2017 22:17  Ref. Range 08/02/2017 20:46  URINALYSIS, ROUTINE W REFLEX MICROSCOPIC Unknown Rpt (A)  Appearance Latest Ref Range: CLEAR  CLOUDY (A)  Bilirubin Urine Latest Ref Range: NEGATIVE  NEGATIVE  Color, Urine Latest Ref Range: YELLOW  YELLOW  Glucose Latest Ref Range: NEGATIVE mg/dL NEGATIVE  Hgb urine dipstick Latest Ref Range: NEGATIVE  SMALL (A)  Ketones, ur Latest Ref Range: NEGATIVE mg/dL NEGATIVE  Leukocytes, UA Latest Ref Range: NEGATIVE  SMALL (A)  Nitrite Latest Ref Range: NEGATIVE  POSITIVE (A)  pH Latest Ref Range: 5.0 - 8.0  5.5  Protein Latest Ref Range: NEGATIVE mg/dL >300 (A)  Specific Gravity, Urine Latest Ref Range: 1.005 - 1.030  >1.030 (H)  Bacteria, UA Latest Ref Range: NONE SEEN  MANY (A)  Granular Casts, UA Unknown PRESENT  Hyaline Casts, UA Unknown PRESENT  RBC / HPF Latest Ref Range: 0 - 5 RBC/hpf 0-5  Squamous Epithelial / LPF Latest Ref Range: NONE SEEN  NONE SEEN  Urine-Other Unknown  LESS THAN 10 mL O...  WBC Clumps Unknown PRESENT  WBC, UA Latest Ref Range: 0 - 5 WBC/hpf TOO NUMEROUS TO C...     Radiological Exams on Admission: Ct Head Wo Contrast  Result  Date: 08/02/2017 CLINICAL DATA:  84 year old male with a history of altered mental status EXAM: CT HEAD WITHOUT CONTRAST TECHNIQUE: Contiguous axial images were obtained from the base of the skull through the vertex without intravenous contrast. COMPARISON:  06/29/2017, 06/14/2017 FINDINGS: Brain: No acute intracranial hemorrhage. No midline shift or mass effect. Gray-white differentiation relatively maintained. Unremarkable configuration the ventricles. Mild volume loss. Vascular: Calcifications of the intracranial vasculature. Skull: No acute fracture. Sinuses/Orbits: Unremarkable appearance of the orbits. Other: None IMPRESSION: Head CT negative for acute intracranial abnormality. Similar appearance of volume loss. Electronically Signed   By: Corrie Mckusick D.O.   On: 08/02/2017 20:29   Dg Chest Port 1 View  Result Date: 08/02/2017 CLINICAL DATA:  84 y/o  M; altered level of consciousness. EXAM: PORTABLE CHEST 1 VIEW COMPARISON:  06/29/2017 chest radiograph FINDINGS: Cardiac silhouette accentuated by low lung volumes and apical lordotic technique. Aortic atherosclerosis with calcification. Clear lungs. No pleural effusion or pneumothorax. No acute osseous abnormality is evident. IMPRESSION: No active disease. Electronically Signed   By: Kristine Garbe M.D.   On: 08/02/2017 20:15    Assessment/Plan Present on Admission: . UTI (urinary tract infection) -Admit to MedSurg -Urine and blood cultures ordered -IV fluid hydration continue -IV Rocephin  . Altered mental status -Due to UTI.  Treating underlying cause -Controlled blood pressure  HTN uncontrolled -SBP>200 in ER. patiuent given clonidine and lopressor. BP improved, now with SBP169. -Home meds resumed -As needed blood pressure medications  ordered  . CKD (chronic kidney disease) stage 3, GFR 30-59 ml/min (HCC) -Stable, at baseline  . DM (diabetes mellitus)(HCC) -Stable, home meds resumed -Siding scale insulin ordered  . Hyperlipidemia -Stable, home meds resumed  Dementia -Home meds resumed  Sir Mallis 08/02/2017, 10:11 PM

## 2017-08-02 NOTE — ED Triage Notes (Signed)
Per EMS pt from home LSN 2000 thought pt was tired realized her was more altered than normal, h/s of stroke and has dementia, no deficits from prior stroke, pt smells of UTI, BP 250/130, CBG 121, 97% room air

## 2017-08-03 ENCOUNTER — Other Ambulatory Visit: Payer: Self-pay

## 2017-08-03 ENCOUNTER — Encounter (HOSPITAL_COMMUNITY): Payer: Self-pay | Admitting: General Practice

## 2017-08-03 LAB — GLUCOSE, CAPILLARY
GLUCOSE-CAPILLARY: 133 mg/dL — AB (ref 65–99)
GLUCOSE-CAPILLARY: 82 mg/dL (ref 65–99)
Glucose-Capillary: 188 mg/dL — ABNORMAL HIGH (ref 65–99)

## 2017-08-03 LAB — BASIC METABOLIC PANEL
Anion gap: 11 (ref 5–15)
BUN: 35 mg/dL — AB (ref 6–20)
CO2: 18 mmol/L — ABNORMAL LOW (ref 22–32)
CREATININE: 1.85 mg/dL — AB (ref 0.61–1.24)
Calcium: 9.6 mg/dL (ref 8.9–10.3)
Chloride: 111 mmol/L (ref 101–111)
GFR calc Af Amer: 35 mL/min — ABNORMAL LOW (ref >60)
GFR calc non Af Amer: 30 mL/min — ABNORMAL LOW (ref >60)
GLUCOSE: 157 mg/dL — AB (ref 65–99)
Potassium: 3.8 mmol/L (ref 3.5–5.1)
SODIUM: 140 mmol/L (ref 135–145)

## 2017-08-03 LAB — CBC
HCT: 31.7 % — ABNORMAL LOW (ref 39.0–52.0)
HCT: 31.9 % — ABNORMAL LOW (ref 39.0–52.0)
Hemoglobin: 10.4 g/dL — ABNORMAL LOW (ref 13.0–17.0)
Hemoglobin: 10.8 g/dL — ABNORMAL LOW (ref 13.0–17.0)
MCH: 32.8 pg (ref 26.0–34.0)
MCH: 33.5 pg (ref 26.0–34.0)
MCHC: 32.8 g/dL (ref 30.0–36.0)
MCHC: 33.9 g/dL (ref 30.0–36.0)
MCV: 100 fL (ref 78.0–100.0)
MCV: 99.1 fL (ref 78.0–100.0)
PLATELETS: 194 10*3/uL (ref 150–400)
PLATELETS: 208 10*3/uL (ref 150–400)
RBC: 3.17 MIL/uL — ABNORMAL LOW (ref 4.22–5.81)
RBC: 3.22 MIL/uL — AB (ref 4.22–5.81)
RDW: 12.7 % (ref 11.5–15.5)
RDW: 12.9 % (ref 11.5–15.5)
WBC: 4 10*3/uL (ref 4.0–10.5)
WBC: 6.6 10*3/uL (ref 4.0–10.5)

## 2017-08-03 LAB — CBG MONITORING, ED: Glucose-Capillary: 148 mg/dL — ABNORMAL HIGH (ref 65–99)

## 2017-08-03 LAB — CREATININE, SERUM
CREATININE: 1.79 mg/dL — AB (ref 0.61–1.24)
GFR calc Af Amer: 36 mL/min — ABNORMAL LOW (ref >60)
GFR calc non Af Amer: 31 mL/min — ABNORMAL LOW (ref >60)

## 2017-08-03 LAB — PHENYTOIN LEVEL, TOTAL: Phenytoin Lvl: 6.8 ug/mL — ABNORMAL LOW (ref 10.0–20.0)

## 2017-08-03 LAB — LACTIC ACID, PLASMA: Lactic Acid, Venous: 2.1 mmol/L (ref 0.5–1.9)

## 2017-08-03 NOTE — ED Notes (Signed)
Rn changed Brief, patient clean and dry and has no complaints at this time.

## 2017-08-03 NOTE — ED Notes (Signed)
Patient CBG was 148. °

## 2017-08-03 NOTE — ED Notes (Signed)
Fluids not infusing

## 2017-08-03 NOTE — Progress Notes (Signed)
Lab called RN with pt's lactic acid results 2.1. MD on unit notified and MD ordered to increase pt's fluids to 152ml/hr. Pt resting in bed with caregiver at bedside. Will continue to closely monitor. PDarden Palmer Wisdom Rickey Rn

## 2017-08-03 NOTE — Progress Notes (Signed)
Pt resting in bed with call light within reach. Reported off to oncoming shift to continue to monitor pt especially the temp. P. Darden Palmer Khush Pasion RN   08/03/17 1948  Vitals  Temp (!) 100.6 F (38.1 C)  Temp Source Oral  BP (!) 136/56  MAP (mmHg) 79  BP Location Right Arm  BP Method Automatic  Patient Position (if appropriate) Lying  Pulse Rate 91  ECG Heart Rate 92  Resp 19  Oxygen Therapy  SpO2 98 %  O2 Device Room Air

## 2017-08-03 NOTE — ED Notes (Signed)
Attempted report 

## 2017-08-03 NOTE — ED Notes (Signed)
Attempted to call report

## 2017-08-03 NOTE — Progress Notes (Signed)
PROGRESS NOTE  Eric [name]:[id number] DOB: 1924/01/03 DOA: 08/02/2017 PCP: [name], MD  HPI/Recap of past 47 hours: 84 year old male with past medical history of stage III chronic kidney disease, BPH, diabetes mellitus and I suspect underlying dementia found by his sister on 4/16 at home confused, too weak to walk eyes closed and combative.  Reportedly the day prior, he was his normal state of health.  He was brought into the emergency room where he was found to have a large urinary tract infection.  Creatinine stable, but lactic acid level actually trended upwards following hospitalization   As has patient's blood pressure.  Patient himself with no complaints, although he does remain chronically confused.  He is only oriented x1.  He denies any complaints  Assessment/Plan: Active Problems:   DM (diabetes mellitus), type 1, uncontrolled w/ophthalmic complication ([place]): Managed with sliding scale.   Hyperlipidemia   Benign prostatic hyperplasia   CKD (chronic kidney disease) stage 3, GFR 30-59 ml/min (HCC)   Altered mental status/Acute metabolic encephalopathy: Secondary to UTI.  I suspect he may have some underlying dementia.  Continue to follow   Seizure [facility]): Continue home medications   UTI (urinary tract infection): Lactic acid level likely elevated secondary to dehydration.  Given normal white count, no signs of tachycardia or fever, would suspect that this is more of a straightforward urinary tract infection.  Continue IV antibiotics and await urine cultures   Code Status: DNR  Family Communication: Left message for sister  Disposition Plan: Continue inpatient for several more days until infection better controlled, mentation closer to baseline   Consultants:  None  Procedures:  None  Antimicrobials:  IV Rocephin 4/16-present  DVT prophylaxis: SCDs   Objective: Vitals:   08/03/17 0745 08/03/17 0800 08/03/17 0815 08/03/17 1133  BP: 138/67 140/66  125/67 (!) 175/85  Pulse: 73 77 76   Resp: 14 13 18 16   Temp:    98 F (36.7 C)  TempSrc:    Oral  SpO2: 100% 100% 100% 100%  Weight:      Height:        Intake/Output Summary (Last 24 hours) at 08/03/2017 1435 Last data filed at 08/02/2017 2340 Gross per 24 hour  Intake 1100 ml  Output -  Net 1100 ml   Filed Weights   08/02/17 1910  Weight: 77.1 kg (170 lb)   Body mass index is 23.06 kg/m.  Exam:   General: Awake, oriented x1  HEENT: Normocephalic and atraumatic, mucous membranes are dry  neck: Supple, no JVD  Cardiovascular: Regular rate and rhythm, S1-S2  Respiratory: Clear to auscultation bilaterally  Abdomen: Soft, nontender, nondistended, positive bowel sounds  Musculoskeletal: No clubbing or cyanosis or edema  Skin: No skin breaks, tears or lesions  Psychiatry: Acutely confused, suspect underlying dementia  Neuro: No focal deficits   Data Reviewed: CBC: Recent Labs  Lab 08/02/17 1929 08/02/17 2357 08/03/17 0500  WBC 6.1 6.6 4.0  HGB 10.9* 10.8* 10.4*  HCT 31.8* 31.9* 31.7*  MCV 98.8 99.1 100.0  PLT 233 208 500   Basic Metabolic Panel: Recent Labs  Lab 08/02/17 1929 08/02/17 2357 08/03/17 0500  NA 139  --  140  K 3.8  --  3.8  CL 107  --  111  CO2 22  --  18*  GLUCOSE 130*  --  157*  BUN 36*  --  35*  CREATININE 1.92* 1.79* 1.85*  CALCIUM 10.2  --  9.6   GFR: Estimated Creatinine  Clearance: 28.4 mL/min (A) (by C-G formula based on SCr of 1.85 mg/dL (H)). Liver Function Tests: Recent Labs  Lab 08/02/17 1929  AST 25  ALT 21  ALKPHOS 160*  BILITOT 0.6  PROT 7.3  ALBUMIN 3.0*   No results for input(s): LIPASE, AMYLASE in the last 168 hours. No results for input(s): AMMONIA in the last 168 hours. Coagulation Profile: No results for input(s): INR, PROTIME in the last 168 hours. Cardiac Enzymes: No results for input(s): CKTOTAL, CKMB, CKMBINDEX, TROPONINI in the last 168 hours. BNP (last 3 results) No results for input(s):  PROBNP in the last 8760 hours. HbA1C: No results for input(s): HGBA1C in the last 72 hours. CBG: Recent Labs  Lab 08/02/17 2324 08/03/17 0745 08/03/17 1300  GLUCAP 135* 148* 188*   Lipid Profile: No results for input(s): CHOL, HDL, LDLCALC, TRIG, CHOLHDL, LDLDIRECT in the last 72 hours. Thyroid Function Tests: No results for input(s): TSH, T4TOTAL, FREET4, T3FREE, THYROIDAB in the last 72 hours. Anemia Panel: No results for input(s): VITAMINB12, FOLATE, FERRITIN, TIBC, IRON, RETICCTPCT in the last 72 hours. Urine analysis:    Component Value Date/Time   COLORURINE YELLOW 08/02/2017 2046   APPEARANCEUR CLOUDY (A) 08/02/2017 2046   LABSPEC >1.030 (H) 08/02/2017 2046   PHURINE 5.5 08/02/2017 2046   GLUCOSEU NEGATIVE 08/02/2017 2046   GLUCOSEU 500 03/30/2011 1055   HGBUR SMALL (A) 08/02/2017 2046   HGBUR moderate 10/03/2008 1349   BILIRUBINUR NEGATIVE 08/02/2017 2046   KETONESUR NEGATIVE 08/02/2017 2046   PROTEINUR >300 (A) 08/02/2017 2046   UROBILINOGEN 0.2 11/09/2014 1717   NITRITE POSITIVE (A) 08/02/2017 2046   LEUKOCYTESUR SMALL (A) 08/02/2017 2046   Sepsis Labs: @LABRCNTIP (procalcitonin:4,lacticidven:4)  )No results found for this or any previous visit (from the past 240 hour(s)).    Studies: Ct Head Wo Contrast  Result Date: 08/02/2017 CLINICAL DATA:  84 year old male with a history of altered mental status EXAM: CT HEAD WITHOUT CONTRAST TECHNIQUE: Contiguous axial images were obtained from the base of the skull through the vertex without intravenous contrast. COMPARISON:  06/29/2017, 06/14/2017 FINDINGS: Brain: No acute intracranial hemorrhage. No midline shift or mass effect. Gray-white differentiation relatively maintained. Unremarkable configuration the ventricles. Mild volume loss. Vascular: Calcifications of the intracranial vasculature. Skull: No acute fracture. Sinuses/Orbits: Unremarkable appearance of the orbits. Other: None IMPRESSION: Head CT negative for  acute intracranial abnormality. Similar appearance of volume loss. Electronically Signed   By: Corrie Mckusick D.O.   On: 08/02/2017 20:29   Dg Chest Port 1 View  Result Date: 08/02/2017 CLINICAL DATA:  84 y/o  M; altered level of consciousness. EXAM: PORTABLE CHEST 1 VIEW COMPARISON:  06/29/2017 chest radiograph FINDINGS: Cardiac silhouette accentuated by low lung volumes and apical lordotic technique. Aortic atherosclerosis with calcification. Clear lungs. No pleural effusion or pneumothorax. No acute osseous abnormality is evident. IMPRESSION: No active disease. Electronically Signed   By: Kristine Garbe M.D.   On: 08/02/2017 20:15    Scheduled Meds: . amLODipine  10 mg Oral QPM  . cloNIDine  0.1 mg Oral BID  . donepezil  10 mg Oral QHS  . heparin  5,000 Units Subcutaneous Q8H  . hydrALAZINE  75 mg Oral TID  . insulin aspart  0-15 Units Subcutaneous TID WC  . insulin aspart  0-5 Units Subcutaneous QHS  . insulin glargine  25 Units Subcutaneous q morning - 10a  . latanoprost  1 drop Both Eyes QHS  . lisinopril  20 mg Oral Daily  . loratadine  10 mg Oral Q lunch  . metoprolol succinate  50 mg Oral Daily  . pantoprazole  40 mg Oral Daily  . phenytoin  300 mg Oral QHS    Continuous Infusions: . sodium chloride 100 mL/hr at 08/03/17 1230  . cefTRIAXone (ROCEPHIN)  IV       LOS: 1 day     Annita Brod, MD [facility]  To reach me or the doctor on call, go to: [email or web address] Password [email or web address]  08/03/2017, 2:35 PM

## 2017-08-03 NOTE — Progress Notes (Signed)
Pt has a healed pressure ulcer to bilateral butt cheeks but skin is intact and not open/broken. Pink foam dsg and intact bilaterally to buttocks. Condom cath applied as pt remains incontinent. Will continue to closely monitor pt. Delia Heady RN

## 2017-08-03 NOTE — Care Management Note (Signed)
Case Management Note  Patient Details  Name: Eric [name] MRN: [id number] Date of Birth: 1923/10/06  Subjective/Objective:       Pt admitted with UTI. He is from home with family. His sister is his POA.             Action/Plan: Currently the plan is for patient to return home at d/c. MD please order PT/OT evals as needed. CM following for d/c needs.    Expected Discharge Date:                  Expected Discharge Plan:     In-House Referral:     Discharge planning Services     Post Acute Care Choice:    Choice offered to:     DME Arranged:    DME Agency:     HH Arranged:    HH Agency:     Status of Service:  In process, will continue to follow  If discussed at Long Length of Stay Meetings, dates discussed:    Additional Comments:  Pollie Friar, RN 08/03/2017, 11:53 AM

## 2017-08-04 DIAGNOSIS — N179 Acute kidney failure, unspecified: Secondary | ICD-10-CM

## 2017-08-04 LAB — GLUCOSE, CAPILLARY
Glucose-Capillary: 61 mg/dL — ABNORMAL LOW (ref 65–99)
Glucose-Capillary: 82 mg/dL (ref 65–99)
Glucose-Capillary: 90 mg/dL (ref 65–99)
Glucose-Capillary: 197 mg/dL — ABNORMAL HIGH (ref 65–99)
Glucose-Capillary: 134 mg/dL — ABNORMAL HIGH (ref 65–99)

## 2017-08-04 LAB — BASIC METABOLIC PANEL WITH GFR
Anion gap: 7 (ref 5–15)
BUN: 41 mg/dL — ABNORMAL HIGH (ref 6–20)
CO2: 19 mmol/L — ABNORMAL LOW (ref 22–32)
Calcium: 8.8 mg/dL — ABNORMAL LOW (ref 8.9–10.3)
Chloride: 114 mmol/L — ABNORMAL HIGH (ref 101–111)
Creatinine, Ser: 2.32 mg/dL — ABNORMAL HIGH (ref 0.61–1.24)
GFR calc Af Amer: 27 mL/min — ABNORMAL LOW
GFR calc non Af Amer: 23 mL/min — ABNORMAL LOW
Glucose, Bld: 77 mg/dL (ref 65–99)
Potassium: 3.8 mmol/L (ref 3.5–5.1)
Sodium: 140 mmol/L (ref 135–145)

## 2017-08-04 LAB — CBC
HCT: 30.8 % — ABNORMAL LOW (ref 39.0–52.0)
Hemoglobin: 10.1 g/dL — ABNORMAL LOW (ref 13.0–17.0)
MCH: 32.9 pg (ref 26.0–34.0)
MCHC: 32.8 g/dL (ref 30.0–36.0)
MCV: 100.3 fL — ABNORMAL HIGH (ref 78.0–100.0)
Platelets: 204 K/uL (ref 150–400)
RBC: 3.07 MIL/uL — ABNORMAL LOW (ref 4.22–5.81)
RDW: 13.4 % (ref 11.5–15.5)
WBC: 9.2 K/uL (ref 4.0–10.5)

## 2017-08-04 LAB — LACTIC ACID, PLASMA: LACTIC ACID, VENOUS: 1 mmol/L (ref 0.5–1.9)

## 2017-08-04 MED ORDER — INSULIN ASPART 100 UNIT/ML ~~LOC~~ SOLN
0.0000 [IU] | Freq: Three times a day (TID) | SUBCUTANEOUS | Status: DC
  Administered 2017-08-04: 2 [IU] via SUBCUTANEOUS
  Administered 2017-08-05: 1 [IU] via SUBCUTANEOUS

## 2017-08-04 NOTE — Progress Notes (Signed)
Inpatient Diabetes Program Recommendations  AACE/ADA: New Consensus Statement on Inpatient Glycemic Control (2015)  Target Ranges:  Prepandial:   less than 140 mg/dL      Peak postprandial:   less than 180 mg/dL (1-2 hours)      Critically ill patients:  140 - 180 mg/dL   Results for MALIKHI, OGAN (MRN [id number]) as of 08/04/2017 09:07  Ref. Range 08/03/2017 07:45 08/03/2017 13:00 08/03/2017 15:23 08/03/2017 22:12 08/04/2017 06:19 08/04/2017 06:54  Glucose-Capillary Latest Ref Range: 65 - 99 mg/dL 148 (H) 188 (H) 133 (H) 82 61 (L) 82   Review of Glycemic Control  Diabetes history: DM 1 Outpatient Diabetes medications: Lantus 25 units, Novolog 0-5 units tid Current orders for Inpatient glycemic control: Lantus 25 units, Novolog Moderate Correction 0-15 units tid  Inpatient Diabetes Program Recommendations:    Patient with hypoglycemia, patient is on a more sensitive Correction scale at home. Consider decreasing Novolog Correction to Novolog Sensitive Correction 0-9 units tid.  Thanks, Tama Headings RN, MSN, BC-ADM, [facility] Inpatient Diabetes Coordinator Team Pager [phone] (8a-5p)

## 2017-08-04 NOTE — Progress Notes (Signed)
PROGRESS NOTE  Eric [name] GUR:[id number] DOB: 1923/12/18 DOA: 08/02/2017 PCP: [name], MD  HPI/Recap of past 11 hours: 84 year old male with past medical history of stage III chronic kidney disease, BPH, diabetes mellitus and I suspect underlying dementia found by his sister on 4/16 at home confused, too weak to walk eyes closed and combative.  Reportedly the day prior, he was his normal state of health.  He was brought into the emergency room where he was found to have a large urinary tract infection.  Creatinine stable, but following admission, blood pressure and lactic acid level trended upward initially.  Today, patient doing much better, oriented x2, calm.  Lactic acid level normalized.  Creatinine did worsen urine cultures return for greater than 100,000 colonies of E. coli  Assessment/Plan: Active Problems:   DM (diabetes mellitus), type 1, uncontrolled w/ophthalmic complication ([place]): Managed with sliding scale.   Hyperlipidemia   Benign prostatic hyperplasia Acute kidney injury CKD (chronic kidney disease) stage 3, GFR 30-59 ml/min ([place]): May have been secondary to Rocephin versus initial poor p.o. intake.  Hydrating and recheck in the morning   Altered mental status/Acute metabolic encephalopathy: Secondary to UTI.  I suspect he may have some underlying dementia.  Appears to be much more at baseline, oriented x2, no acute distress.   Seizure [facility]): Continue home medications   UTI (urinary tract infection): Lactic acid level likely elevated secondary to dehydration.  Given normal white count, no signs of tachycardia or fever, would suspect that this is more of a straightforward urinary tract infection.  Continue IV antibiotics and await urine cultures   Code Status: DNR  Family Communication: Spoke with caregiver.  Disposition Plan: Waiting for PT evaluation, potential discharge tomorrow   Consultants:  None  Procedures:  None  Antimicrobials:  IV Rocephin  4/16-present  DVT prophylaxis: SCDs   Objective: Vitals:   08/03/17 2326 08/04/17 0443 08/04/17 0855 08/04/17 1213  BP: (!) 148/73 (!) 151/93 (!) 189/99 (!) 163/75  Pulse: 90 85 88 85  Resp: 20 18 19    Temp: 99.3 F (37.4 C) 97.7 F (36.5 C) 97.7 F (36.5 C) 98.1 F (36.7 C)  TempSrc: Axillary Oral Oral Oral  SpO2: 100% 100% 100% 100%  Weight:      Height:        Intake/Output Summary (Last 24 hours) at 08/04/2017 1519 Last data filed at 08/04/2017 1441 Gross per 24 hour  Intake 546.67 ml  Output -  Net 546.67 ml   Filed Weights   08/02/17 1910  Weight: 77.1 kg (170 lb)   Body mass index is 23.06 kg/m.  Exam:   General: Awake, oriented x2  HEENT: Normocephalic and atraumatic, mucous membranes are dry  neck: Supple, no JVD  Cardiovascular: Regular rate and rhythm, S1-S2  Respiratory: Clear to auscultation bilaterally  Abdomen: Soft, nontender, nondistended, positive bowel sounds  Musculoskeletal: No clubbing or cyanosis or edema  Skin: No skin breaks, tears or lesions  Psychiatry:  no acute psychoses.  There may be some mild underlying dementia  Neuro: No focal deficits   Data Reviewed: CBC: Recent Labs  Lab 08/02/17 1929 08/02/17 2357 08/03/17 0500 08/04/17 0505  WBC 6.1 6.6 4.0 9.2  HGB 10.9* 10.8* 10.4* 10.1*  HCT 31.8* 31.9* 31.7* 30.8*  MCV 98.8 99.1 100.0 100.3*  PLT 233 208 194 283   Basic Metabolic Panel: Recent Labs  Lab 08/02/17 1929 08/02/17 2357 08/03/17 0500 08/04/17 0505  NA 139  --  140 140  K 3.8  --  3.8 3.8  CL 107  --  111 114*  CO2 22  --  18* 19*  GLUCOSE 130*  --  157* 77  BUN 36*  --  35* 41*  CREATININE 1.92* 1.79* 1.85* 2.32*  CALCIUM 10.2  --  9.6 8.8*   GFR: Estimated Creatinine Clearance: 22.6 mL/min (A) (by C-G formula based on SCr of 2.32 mg/dL (H)). Liver Function Tests: Recent Labs  Lab 08/02/17 1929  AST 25  ALT 21  ALKPHOS 160*  BILITOT 0.6  PROT 7.3  ALBUMIN 3.0*   No results for  input(s): LIPASE, AMYLASE in the last 168 hours. No results for input(s): AMMONIA in the last 168 hours. Coagulation Profile: No results for input(s): INR, PROTIME in the last 168 hours. Cardiac Enzymes: No results for input(s): CKTOTAL, CKMB, CKMBINDEX, TROPONINI in the last 168 hours. BNP (last 3 results) No results for input(s): PROBNP in the last 8760 hours. HbA1C: No results for input(s): HGBA1C in the last 72 hours. CBG: Recent Labs  Lab 08/03/17 1523 08/03/17 2212 08/04/17 0619 08/04/17 0654 08/04/17 1216  GLUCAP 133* 82 61* 82 90   Lipid Profile: No results for input(s): CHOL, HDL, LDLCALC, TRIG, CHOLHDL, LDLDIRECT in the last 72 hours. Thyroid Function Tests: No results for input(s): TSH, T4TOTAL, FREET4, T3FREE, THYROIDAB in the last 72 hours. Anemia Panel: No results for input(s): VITAMINB12, FOLATE, FERRITIN, TIBC, IRON, RETICCTPCT in the last 72 hours. Urine analysis:    Component Value Date/Time   COLORURINE YELLOW 08/02/2017 2046   APPEARANCEUR CLOUDY (A) 08/02/2017 2046   LABSPEC >1.030 (H) 08/02/2017 2046   PHURINE 5.5 08/02/2017 2046   GLUCOSEU NEGATIVE 08/02/2017 2046   GLUCOSEU 500 03/30/2011 1055   HGBUR SMALL (A) 08/02/2017 2046   HGBUR moderate 10/03/2008 [place] NEGATIVE 08/02/2017 2046   KETONESUR NEGATIVE 08/02/2017 2046   PROTEINUR >300 (A) 08/02/2017 2046   UROBILINOGEN 0.2 11/09/2014 1717   NITRITE POSITIVE (A) 08/02/2017 2046   LEUKOCYTESUR SMALL (A) 08/02/2017 2046   Sepsis Labs: @LABRCNTIP (procalcitonin:4,lacticidven:4)  ) Recent Results (from the past 240 hour(s))  Urine culture     Status: Abnormal (Preliminary result)   Collection Time: 08/02/17  8:46 PM  Result Value Ref Range Status   Specimen Description URINE, CATHETERIZED  Final   Special Requests NONE  Final   Culture (A)  Final    >=100,000 COLONIES/mL ESCHERICHIA COLI SUSCEPTIBILITIES TO FOLLOW Performed at [facility] [address]  [place], [place] [address]    Report Status PENDING  Incomplete      Studies: No results found.  Scheduled Meds: . amLODipine  10 mg Oral QPM  . cloNIDine  0.1 mg Oral BID  . donepezil  10 mg Oral QHS  . heparin  5,000 Units Subcutaneous Q8H  . hydrALAZINE  75 mg Oral TID  . insulin aspart  0-9 Units Subcutaneous TID WC  . insulin glargine  25 Units Subcutaneous q morning - 10a  . latanoprost  1 drop Both Eyes QHS  . loratadine  10 mg Oral Q lunch  . metoprolol succinate  50 mg Oral Daily  . pantoprazole  40 mg Oral Daily  . phenytoin  300 mg Oral QHS    Continuous Infusions: . sodium chloride 100 mL/hr at 08/04/17 0216  . cefTRIAXone (ROCEPHIN)  IV Stopped (08/03/17 2323)     LOS: 2 days     Annita Brod, MD [facility]  To reach me  or the doctor on call, go to: [email or web address] Password [facility]  08/04/2017, 3:19 PM

## 2017-08-04 NOTE — Evaluation (Signed)
Physical Therapy Evaluation Patient Details Name: Eric [name] MRN: [id number] DOB: 02-Oct-1923 Today's Date: 08/04/2017   History of Present Illness  Pt is a 84 y/o male with past medical history of stage III chronic kidney disease, BPH, diabetes mellitus and suspected underlying dementia. Pt was found by his sister on 4/16 at home confused, too weak to walk, eyes closed and combative.  Reportedly the day prior, he was his normal state of health.  He was brought into the emergency room where he was found to have a large urinary tract infection.      Clinical Impression  Pt presented supine in bed with HOB elevated, awake and willing to participate in therapy session. Uncertain of pt's PLOF as he was confused throughout and no family/caregivers present during evaluation. Pt currently requires mod A for bed mobility, mod A to stand from elevated bed position with RW and mod-max A to take a few lateral steps at EOB with RW. Pt would continue to benefit from skilled physical therapy services at this time while admitted and after d/c to address the below listed limitations in order to improve overall safety and independence with functional mobility.     Follow Up Recommendations SNF;Supervision/Assistance - 24 hour    Equipment Recommendations  None recommended by PT    Recommendations for Other Services       Precautions / Restrictions Precautions Precautions: Fall Restrictions Weight Bearing Restrictions: No      Mobility  Bed Mobility Overal bed mobility: Needs Assistance Bed Mobility: Rolling;Sidelying to Sit;Sit to Supine Rolling: Mod assist Sidelying to sit: Min assist   Sit to supine: Min assist   General bed mobility comments: increased time and effort, use of bed rails, light assist to elevated trunk and to return bilateral LEs onto bed  Transfers Overall transfer level: Needs assistance Equipment used: Rolling walker (2 wheeled) Transfers: Sit to/from Stand Sit to  Stand: Mod assist;From elevated surface         General transfer comment: increased time and effort, good technique, bed in elevated position, mod A to power into standing and for stability with transitional movement  Ambulation/Gait             General Gait Details: pt able to take 3 side steps towards his L side towards HOB with mod-max A and RW  Stairs            Wheelchair Mobility    Modified Rankin (Stroke Patients Only)       Balance Overall balance assessment: Needs assistance;History of Falls Sitting-balance support: Feet supported Sitting balance-Leahy Scale: Fair     Standing balance support: During functional activity;Bilateral upper extremity supported Standing balance-Leahy Scale: Poor                               Pertinent Vitals/Pain Pain Assessment: No/denies pain    Home Living Family/patient expects to be discharged to:: Unsure                 Additional Comments: pt confused and unable to provide reliable information; no family or caregiver present during evaluation    Prior Function           Comments: pt confused and unable to provide reliable information; no family or caregiver present during evaluation     Hand Dominance        Extremity/Trunk Assessment   Upper Extremity Assessment Upper Extremity Assessment: Generalized weakness  Lower Extremity Assessment Lower Extremity Assessment: Generalized weakness       Communication   Communication: No difficulties  Cognition Arousal/Alertness: Awake/alert Behavior During Therapy: WFL for tasks assessed/performed Overall Cognitive Status: Impaired/Different from baseline Area of Impairment: Memory;Following commands;Safety/judgement;Problem solving                     Memory: Decreased short-term memory;Decreased recall of precautions Following Commands: Follows one step commands consistently Safety/Judgement: Decreased awareness of  safety;Decreased awareness of deficits   Problem Solving: Difficulty sequencing;Requires verbal cues        General Comments      Exercises     Assessment/Plan    PT Assessment Patient needs continued PT services  PT Problem List Decreased strength;Decreased activity tolerance;Decreased balance;Decreased mobility;Decreased coordination;Decreased cognition;Decreased knowledge of use of DME;Decreased safety awareness;Decreased knowledge of precautions       PT Treatment Interventions DME instruction;Gait training;Stair training;Functional mobility training;Therapeutic activities;Therapeutic exercise;Balance training;Neuromuscular re-education;Cognitive remediation;Patient/family education    PT Goals (Current goals can be found in the Care Plan section)  Acute Rehab PT Goals Patient Stated Goal: return home PT Goal Formulation: Patient unable to participate in goal setting Time For Goal Achievement: 08/18/17 Potential to Achieve Goals: Good    Frequency Min 2X/week   Barriers to discharge        Co-evaluation               AM-PAC PT "6 Clicks" Daily Activity  Outcome Measure Difficulty turning over in bed (including adjusting bedclothes, sheets and blankets)?: Unable Difficulty moving from lying on back to sitting on the side of the bed? : Unable Difficulty sitting down on and standing up from a chair with arms (e.g., wheelchair, bedside commode, etc,.)?: Unable Help needed moving to and from a bed to chair (including a wheelchair)?: A Lot Help needed walking in hospital room?: A Lot Help needed climbing 3-5 steps with a railing? : Total 6 Click Score: 8    End of Session Equipment Utilized During Treatment: Gait belt Activity Tolerance: Patient tolerated treatment well Patient left: in bed;with call bell/phone within reach;with bed alarm set Nurse Communication: Mobility status PT Visit Diagnosis: Other abnormalities of gait and mobility (R26.89);Muscle  weakness (generalized) (M62.81)    Time: 6415-8309 PT Time Calculation (min) (ACUTE ONLY): 23 min   Charges:   PT Evaluation $PT Eval Moderate Complexity: 1 Mod PT Treatments $Therapeutic Activity: 8-22 mins   PT G Codes:        Gilbert, PT, DPT [place]   Redbird Smith 08/04/2017, 5:12 PM

## 2017-08-04 NOTE — Plan of Care (Signed)
  Problem: Clinical Measurements: Goal: Ability to maintain clinical measurements within normal limits will improve Outcome: Progressing   

## 2017-08-05 DIAGNOSIS — E1039 Type 1 diabetes mellitus with other diabetic ophthalmic complication: Secondary | ICD-10-CM

## 2017-08-05 DIAGNOSIS — B9620 Unspecified Escherichia coli [E. coli] as the cause of diseases classified elsewhere: Secondary | ICD-10-CM

## 2017-08-05 DIAGNOSIS — N3000 Acute cystitis without hematuria: Secondary | ICD-10-CM

## 2017-08-05 DIAGNOSIS — N183 Chronic kidney disease, stage 3 (moderate): Secondary | ICD-10-CM

## 2017-08-05 DIAGNOSIS — N390 Urinary tract infection, site not specified: Principal | ICD-10-CM

## 2017-08-05 DIAGNOSIS — R4182 Altered mental status, unspecified: Secondary | ICD-10-CM

## 2017-08-05 DIAGNOSIS — G9341 Metabolic encephalopathy: Secondary | ICD-10-CM

## 2017-08-05 DIAGNOSIS — R569 Unspecified convulsions: Secondary | ICD-10-CM

## 2017-08-05 DIAGNOSIS — E1065 Type 1 diabetes mellitus with hyperglycemia: Secondary | ICD-10-CM

## 2017-08-05 DIAGNOSIS — E785 Hyperlipidemia, unspecified: Secondary | ICD-10-CM

## 2017-08-05 LAB — CBC
HEMATOCRIT: 25.5 % — AB (ref 39.0–52.0)
Hemoglobin: 8.5 g/dL — ABNORMAL LOW (ref 13.0–17.0)
MCH: 33.6 pg (ref 26.0–34.0)
MCHC: 33.3 g/dL (ref 30.0–36.0)
MCV: 100.8 fL — ABNORMAL HIGH (ref 78.0–100.0)
PLATELETS: 147 10*3/uL — AB (ref 150–400)
RBC: 2.53 MIL/uL — ABNORMAL LOW (ref 4.22–5.81)
RDW: 13.6 % (ref 11.5–15.5)
WBC: 6.7 10*3/uL (ref 4.0–10.5)

## 2017-08-05 LAB — BASIC METABOLIC PANEL
ANION GAP: 9 (ref 5–15)
BUN: 41 mg/dL — AB (ref 6–20)
CO2: 17 mmol/L — ABNORMAL LOW (ref 22–32)
Calcium: 8.4 mg/dL — ABNORMAL LOW (ref 8.9–10.3)
Chloride: 112 mmol/L — ABNORMAL HIGH (ref 101–111)
Creatinine, Ser: 2.18 mg/dL — ABNORMAL HIGH (ref 0.61–1.24)
GFR calc Af Amer: 29 mL/min — ABNORMAL LOW (ref >60)
GFR, EST NON AFRICAN AMERICAN: 25 mL/min — AB (ref >60)
Glucose, Bld: 112 mg/dL — ABNORMAL HIGH (ref 65–99)
POTASSIUM: 3.6 mmol/L (ref 3.5–5.1)
SODIUM: 138 mmol/L (ref 135–145)

## 2017-08-05 LAB — URINE CULTURE

## 2017-08-05 LAB — GLUCOSE, CAPILLARY
GLUCOSE-CAPILLARY: 106 mg/dL — AB (ref 65–99)
Glucose-Capillary: 55 mg/dL — ABNORMAL LOW (ref 65–99)
Glucose-Capillary: 99 mg/dL (ref 65–99)
Glucose-Capillary: 137 mg/dL — ABNORMAL HIGH (ref 65–99)

## 2017-08-05 MED ORDER — CEFDINIR 300 MG PO CAPS
300.0000 mg | ORAL_CAPSULE | Freq: Every day | ORAL | Status: DC
  Administered 2017-08-05: 300 mg via ORAL
  Filled 2017-08-05: qty 1

## 2017-08-05 MED ORDER — CEFDINIR 300 MG PO CAPS: 300.0000 mg | ORAL_CAPSULE | Freq: Every day | ORAL | Status: AC

## 2017-08-05 NOTE — Care Management Important Message (Signed)
Important Message  Patient Details  Name: Eric [name] MRN: [id number] Date of Birth: 1924/01/01   Medicare Important Message Given:  Yes    [name] [name] 08/05/2017, 2:49 PM

## 2017-08-05 NOTE — Progress Notes (Signed)
Patient will discharge to [facility] Anticipated discharge date: 4/19 Family notified: pt sister Philis Fendt by Corey Harold- scheduled for 3pm Report #: [phone] Rm 128A  CSW signing off.  Jorge Ny, LCSW Clinical Social Worker [phone]

## 2017-08-05 NOTE — Progress Notes (Signed)
Pt A&OX2. This morning's CBG=55. Pt was given orange juice. Repeat CBG=99.

## 2017-08-05 NOTE — Clinical Social Work Placement (Signed)
   CLINICAL SOCIAL WORK PLACEMENT  NOTE  Date:  08/05/2017  Patient Details  Name: Eric [name] MRN: [id number] Date of Birth: [birth year]  Clinical Social Work is seeking post-discharge placement for this patient at the [place] level of care (*CSW will initial, date and re-position this form in  chart as items are completed):  Yes   Patient/family provided with [facility] Work Department's list of facilities offering this level of care within the geographic area requested by the patient (or if unable, by the patient's family).  Yes   Patient/family informed of their freedom to choose among providers that offer the needed level of care, that participate in Medicare, Medicaid or managed care program needed by the patient, have an available bed and are willing to accept the patient.  Yes   Patient/family informed of [facility]'s ownership interest in [facility], as well as of the fact that they are under no obligation to receive care at these facilities.  PASRR submitted to EDS on       PASRR number received on       Existing PASRR number confirmed on 08/05/17     FL2 transmitted to all facilities in geographic area requested by pt/family on 08/05/17     FL2 transmitted to all facilities within larger geographic area on       Patient informed that his/her managed care company has contracts with or will negotiate with certain facilities, including the following:        Yes   Patient/family informed of bed offers received.  Patient chooses bed at [facility]     Physician recommends and patient chooses bed at      Patient to be transferred to [facility] on 08/05/17.  Patient to be transferred to facility by [facility]     Patient family notified on 08/05/17 of transfer.  Name of family member notified:  Anna HIll     PHYSICIAN Please sign FL2     Additional Comment:     _______________________________________________ Jorge Ny, LCSW 08/05/2017, 12:53 PM

## 2017-08-05 NOTE — Progress Notes (Signed)
Inpatient Diabetes Program Recommendations  AACE/ADA: New Consensus Statement on Inpatient Glycemic Control (2015)  Target Ranges:  Prepandial:   less than 140 mg/dL      Peak postprandial:   less than 180 mg/dL (1-2 hours)      Critically ill patients:  140 - 180 mg/dL   Results for FALLOU, HULBERT (MRN [id number]) as of 08/05/2017 09:36  Ref. Range 08/04/2017 06:19 08/04/2017 06:54 08/04/2017 12:16 08/04/2017 16:23 08/04/2017 21:08 08/05/2017 06:17 08/05/2017 07:06  Glucose-Capillary Latest Ref Range: 65 - 99 mg/dL 61 (L) 82 90 197 (H) 134 (H) 55 (L) 99   Review of Glycemic Control  Diabetes history: DM 1 Outpatient Diabetes medications: Lantus 25 units, Novolog 0-5 units tid Current orders for Inpatient glycemic control: Lantus 25 units, Novolog Sensitive Correction 0-9 units tid  Inpatient Diabetes Program Recommendations:    Hypoglycemia again this am in the 50's. Consider decreasing home dose of Lantus to 22 units.  Thanks,  Tama Headings RN, MSN, BC-ADM, [facility] Inpatient Diabetes Coordinator Team Pager [phone] (8a-5p)

## 2017-08-05 NOTE — Discharge Summary (Signed)
Physician Discharge Summary  Virgie Chery Poorman BJS:[id number] DOB: 05-12-1923 DOA: 08/02/2017  PCP: [name], MD  Admit date: 08/02/2017 Discharge date: 08/05/2017  Admitted From: Home Disposition: SNF  Recommendations for Outpatient Follow-up:  1. Follow up with PCP in 1 week 2. Please obtain BMP/CBC in one week to recheck kidney function. 3. Restart lisinopril 20 mg daily when creatinine at baseline 4. Please follow up on the following pending results: None  Home Health: SNF Equipment/Devices: None  Discharge Condition: Stable CODE STATUS: DNR Diet recommendation: Heart healthy   Brief/Interim Summary:  Admission HPI written by Quintella Baton, MD   Chief Complaint:  Confusion  HPI: this is a 84 y/o who presents today was found to be poorly responsive to questions. He strayed in a fetal position all day with huis eyes closed.  When they tried to get him to do things he was combative. He  was took weak to walk. He was confused. He was warm to the touch. Yesterday he was his usual self, this was new as of this morning. He was taken to the ER. History provided by his sister Donnie Aho his [place]  [phone].    Hospital course:  UTI Urine culture significant for E. Coli. Sensitive to 3rd generation cephalosporin. Empirically treated with ceftriaxone and transitioned to Cefdinir (renally adjusted) prior to discharge. Continue for a total of 10 days. End date 08/12/17  Diabetes mellitus, type 1 Insulin dependent. Last hemoglobin A1C of 6.5% in 2016. On Lantus 25 units daily and sliding scale.  Acute metabolic encephalopathy Secondary to UTI. Seems baseline.  Seizure disorder Stable. Continued Dilantin  Essential hypertension Slightly uncontrolled. Continued amlodipine, hydralazine, metoprolol, clonidine. Lisinopril held in setting of AKI.  Acute kidney injury on CKD 3 Baseline creatinine of 1.8-2  Dementia Continued Aricept  Hyperlipidemia Continued  Lipitor   Discharge Diagnoses:  Active Problems:   DM (diabetes mellitus), type 1, uncontrolled w/ophthalmic complication (HCC)   Hyperlipidemia   Benign prostatic hyperplasia   CKD (chronic kidney disease) stage 3, GFR 30-59 ml/min (HCC)   Acute metabolic encephalopathy   Altered mental status   Seizure ([place])   UTI (urinary tract infection)    Discharge Instructions  Discharge Instructions    Diet - low sodium heart healthy   Complete by:  As directed    Increase activity slowly   Complete by:  As directed      Allergies as of 08/05/2017      Reactions   Percocet [oxycodone-acetaminophen] Rash      Medication List    STOP taking these medications   atorvastatin 20 MG tablet Commonly known as:  LIPITOR   doxycycline 100 MG tablet Commonly known as:  ADOXA   lisinopril 20 MG tablet Commonly known as:  PRINIVIL,ZESTRIL   lisinopril 40 MG tablet Commonly known as:  PRINIVIL,ZESTRIL     TAKE these medications   ACCU-CHEK AVIVA PLUS test strip Generic drug:  glucose blood   acetaminophen 500 MG tablet Commonly known as:  TYLENOL Take 500 mg by mouth 2 (two) times daily. Reported on 06/19/2015   amLODipine 10 MG tablet Commonly known as:  NORVASC Take 10 mg by mouth every evening.   B-D UF III MINI PEN NEEDLES 31G X 5 MM Misc Generic drug:  Insulin Pen Needle   calamine lotion Apply 1 application topically 2 (two) times daily as needed for itching (to shoulders/ back).   calcitRIOL 0.25 MCG capsule Commonly known as:  ROCALTROL Take 0.25 mcg  by mouth daily.   cefdinir 300 MG capsule Commonly known as:  OMNICEF Take 1 capsule (300 mg total) by mouth daily for 6 days. First dose on 08/06/17 Start taking on:  08/06/2017   cholecalciferol 1000 units tablet Commonly known as:  VITAMIN D Take 2,000 Units by mouth daily.   cloNIDine 0.1 MG tablet Commonly known as:  CATAPRES Take 0.1 mg by mouth 2 (two) times daily.   diclofenac sodium 1 % Gel Commonly  known as:  VOLTAREN Apply 2 g topically 4 (four) times daily.   docusate sodium 100 MG capsule Commonly known as:  COLACE Take 100 mg by mouth daily.   donepezil 10 MG tablet Commonly known as:  ARICEPT Take 1 tablet (10 mg total) by mouth at bedtime.   fluticasone 50 MCG/ACT nasal spray Commonly known as:  FLONASE Place 1 spray into both nostrils 2 (two) times daily. What changed:    when to take this  reasons to take this   hydrALAZINE 25 MG tablet Commonly known as:  APRESOLINE Take 75 mg by mouth 3 (three) times daily.   hydrOXYzine 10 MG tablet Commonly known as:  ATARAX/VISTARIL TAKE 1 TABLET BY MOUTH TWICE DAILY AS NEEDED FOR ITCHING   LANTUS SOLOSTAR 100 UNIT/ML Solostar Pen Generic drug:  Insulin Glargine Inject 25 Units into the skin every morning.   latanoprost 0.005 % ophthalmic solution Commonly known as:  XALATAN Place 1 drop into both eyes at bedtime.   loratadine 10 MG tablet Commonly known as:  CLARITIN Take 10 mg by mouth daily with lunch.   LUMIGAN 0.01 % Soln Generic drug:  bimatoprost Place 1 drop into both eyes at bedtime.   metoprolol succinate 50 MG 24 hr tablet Commonly known as:  TOPROL-XL Take 50 mg by mouth daily.   NASACORT AQ NA Place 1 spray into both nostrils daily as needed (for allergies).   NOVOLOG FLEXPEN 100 UNIT/ML FlexPen Generic drug:  insulin aspart Inject 0-5 Units into the skin 3 (three) times daily as needed for high blood sugar (sliding scale).   OVER THE COUNTER MEDICATION Apply 1 application topically 4 (four) times daily as needed (clotrimazole= to the buttocks for rash as needed).   pantoprazole 40 MG tablet Commonly known as:  PROTONIX Take 40 mg by mouth daily.   phenytoin 100 MG ER capsule Commonly known as:  DILANTIN Take 300 mg by mouth at bedtime.   polyethylene glycol powder powder Commonly known as:  GLYCOLAX/MIRALAX Take 17 g by mouth daily as needed for mild constipation.   PROBIOTIC-10  PO Take 1 capsule by mouth daily.   senna 8.6 MG Tabs tablet Commonly known as:  SENOKOT Take 1 tablet by mouth daily.   SYSTANE ULTRA 0.4-0.3 % Soln Generic drug:  Polyethyl Glycol-Propyl Glycol Place 1 drop into both eyes 3 (three) times daily as needed (dry eyes).       Allergies  Allergen Reactions  . Percocet [Oxycodone-Acetaminophen] Rash    Consultations:  None   Procedures/Studies: Ct Head Wo Contrast  Result Date: 08/02/2017 CLINICAL DATA:  84 year old male with a history of altered mental status EXAM: CT HEAD WITHOUT CONTRAST TECHNIQUE: Contiguous axial images were obtained from the base of the skull through the vertex without intravenous contrast. COMPARISON:  06/29/2017, 06/14/2017 FINDINGS: Brain: No acute intracranial hemorrhage. No midline shift or mass effect. Gray-white differentiation relatively maintained. Unremarkable configuration the ventricles. Mild volume loss. Vascular: Calcifications of the intracranial vasculature. Skull: No acute fracture. Sinuses/Orbits: Unremarkable appearance  of the orbits. Other: None IMPRESSION: Head CT negative for acute intracranial abnormality. Similar appearance of volume loss. Electronically Signed   By: Corrie Mckusick D.O.   On: 08/02/2017 20:29   Dg Chest Port 1 View  Result Date: 08/02/2017 CLINICAL DATA:  84 y/o  M; altered level of consciousness. EXAM: PORTABLE CHEST 1 VIEW COMPARISON:  06/29/2017 chest radiograph FINDINGS: Cardiac silhouette accentuated by low lung volumes and apical lordotic technique. Aortic atherosclerosis with calcification. Clear lungs. No pleural effusion or pneumothorax. No acute osseous abnormality is evident. IMPRESSION: No active disease. Electronically Signed   By: Kristine Garbe M.D.   On: 08/02/2017 20:15      Subjective: No issues  Discharge Exam: Vitals:   08/05/17 0408 08/05/17 0843  BP: (!) 153/68 (!) 142/73  Pulse: 76 67  Resp: 18 18  Temp: 97.9 F (36.6 C) 98 F  (36.7 C)  SpO2: 99% 100%   Vitals:   08/04/17 2217 08/05/17 0039 08/05/17 0408 08/05/17 0843  BP: (!) 181/76 (!) 129/59 (!) 153/68 (!) 142/73  Pulse: 95 77 76 67  Resp:  18 18 18   Temp:  98.9 F (37.2 C) 97.9 F (36.6 C) 98 F (36.7 C)  TempSrc:  Oral Oral Oral  SpO2:  100% 99% 100%  Weight:      Height:        General exam: Appears calm and comfortable Respiratory system: Clear to auscultation. Respiratory effort normal. Cardiovascular system: S1 & S2 heard, RRR. No murmurs, rubs, gallops or clicks. Gastrointestinal system: Abdomen is nondistended, soft and nontender. No organomegaly or masses felt. Normal bowel sounds heard. Central nervous system: Alert and oriented to person only. No focal neurological deficits. Extremities: No edema. No calf tenderness Skin: No cyanosis. No rashes Psychiatry: Normal mood/affect  The results of significant diagnostics from this hospitalization (including imaging, microbiology, ancillary and laboratory) are listed below for reference.     Microbiology: Recent Results (from the past 240 hour(s))  Urine culture     Status: Abnormal   Collection Time: 08/02/17  8:46 PM  Result Value Ref Range Status   Specimen Description URINE, CATHETERIZED  Final   Special Requests   Final    NONE Performed at [facility], [address] [place], [place] [address]    Culture >=100,000 COLONIES/mL ESCHERICHIA COLI (A)  Final   Report Status 08/05/2017 FINAL  Final   Organism ID, Bacteria ESCHERICHIA COLI (A)  Final      Susceptibility   Escherichia coli - MIC*    AMPICILLIN >=32 RESISTANT Resistant     CEFAZOLIN >=64 RESISTANT Resistant     CEFTRIAXONE <=1 SENSITIVE Sensitive     CIPROFLOXACIN <=0.25 SENSITIVE Sensitive     GENTAMICIN <=1 SENSITIVE Sensitive     IMIPENEM <=0.25 SENSITIVE Sensitive     NITROFURANTOIN <=16 SENSITIVE Sensitive     TRIMETH/SULFA <=20 SENSITIVE Sensitive     AMPICILLIN/SULBACTAM >=32 RESISTANT Resistant      PIP/TAZO 8 SENSITIVE Sensitive     Extended ESBL NEGATIVE Sensitive     * >=100,000 COLONIES/mL ESCHERICHIA COLI     Labs: BNP (last 3 results) Recent Labs    06/29/17 1329  BNP 176.1*   Basic Metabolic Panel: Recent Labs  Lab 08/02/17 1929 08/02/17 2357 08/03/17 0500 08/04/17 0505 08/05/17 0739  NA 139  --  140 140 138  K 3.8  --  3.8 3.8 3.6  CL 107  --  111 114* 112*  CO2 22  --  18* 19* 17*  GLUCOSE 130*  --  157* 77 112*  BUN 36*  --  35* 41* 41*  CREATININE 1.92* 1.79* 1.85* 2.32* 2.18*  CALCIUM 10.2  --  9.6 8.8* 8.4*   Liver Function Tests: Recent Labs  Lab 08/02/17 1929  AST 25  ALT 21  ALKPHOS 160*  BILITOT 0.6  PROT 7.3  ALBUMIN 3.0*   No results for input(s): LIPASE, AMYLASE in the last 168 hours. No results for input(s): AMMONIA in the last 168 hours. CBC: Recent Labs  Lab 08/02/17 1929 08/02/17 2357 08/03/17 0500 08/04/17 0505 08/05/17 0739  WBC 6.1 6.6 4.0 9.2 6.7  HGB 10.9* 10.8* 10.4* 10.1* 8.5*  HCT 31.8* 31.9* 31.7* 30.8* 25.5*  MCV 98.8 99.1 100.0 100.3* 100.8*  PLT 233 208 194 204 147*   Cardiac Enzymes: No results for input(s): CKTOTAL, CKMB, CKMBINDEX, TROPONINI in the last 168 hours. BNP: Invalid input(s): POCBNP CBG: Recent Labs  Lab 08/04/17 1623 08/04/17 2108 08/05/17 0617 08/05/17 0706 08/05/17 1150  GLUCAP 197* 134* 55* 99 137*   D-Dimer No results for input(s): DDIMER in the last 72 hours. Hgb A1c No results for input(s): HGBA1C in the last 72 hours. Lipid Profile No results for input(s): CHOL, HDL, LDLCALC, TRIG, CHOLHDL, LDLDIRECT in the last 72 hours. Thyroid function studies No results for input(s): TSH, T4TOTAL, T3FREE, THYROIDAB in the last 72 hours.  Invalid input(s): FREET3 Anemia work up No results for input(s): VITAMINB12, FOLATE, FERRITIN, TIBC, IRON, RETICCTPCT in the last 72 hours. Urinalysis    Component Value Date/Time   COLORURINE YELLOW 08/02/2017 2046   APPEARANCEUR CLOUDY (A)  08/02/2017 2046   LABSPEC >1.030 (H) 08/02/2017 2046   PHURINE 5.5 08/02/2017 2046   GLUCOSEU NEGATIVE 08/02/2017 2046   GLUCOSEU 500 03/30/2011 1055   HGBUR SMALL (A) 08/02/2017 2046   HGBUR moderate 10/03/2008 1349   BILIRUBINUR NEGATIVE 08/02/2017 2046   KETONESUR NEGATIVE 08/02/2017 2046   PROTEINUR >300 (A) 08/02/2017 2046   UROBILINOGEN 0.2 11/09/2014 1717   NITRITE POSITIVE (A) 08/02/2017 2046   LEUKOCYTESUR SMALL (A) 08/02/2017 2046   Sepsis Labs Invalid input(s): PROCALCITONIN,  WBC,  LACTICIDVEN Microbiology Recent Results (from the past 240 hour(s))  Urine culture     Status: Abnormal   Collection Time: 08/02/17  8:46 PM  Result Value Ref Range Status   Specimen Description URINE, CATHETERIZED  Final   Special Requests   Final    NONE Performed at [facility], [address], [place], [place] [address]    Culture >=100,000 COLONIES/mL ESCHERICHIA COLI (A)  Final   Report Status 08/05/2017 FINAL  Final   Organism ID, Bacteria ESCHERICHIA COLI (A)  Final      Susceptibility   Escherichia coli - MIC*    AMPICILLIN >=32 RESISTANT Resistant     CEFAZOLIN >=64 RESISTANT Resistant     CEFTRIAXONE <=1 SENSITIVE Sensitive     CIPROFLOXACIN <=0.25 SENSITIVE Sensitive     GENTAMICIN <=1 SENSITIVE Sensitive     IMIPENEM <=0.25 SENSITIVE Sensitive     NITROFURANTOIN <=16 SENSITIVE Sensitive     TRIMETH/SULFA <=20 SENSITIVE Sensitive     AMPICILLIN/SULBACTAM >=32 RESISTANT Resistant     PIP/TAZO 8 SENSITIVE Sensitive     Extended ESBL NEGATIVE Sensitive     * >=100,000 COLONIES/mL ESCHERICHIA COLI     Time coordinating discharge: Over 30 minutes  SIGNED:   Cordelia Poche, MD [facility] 08/05/2017, 11:52 AM Pager [phone]  If 7PM-7AM, please  contact night-coverage [email or web address] Password [email or web address]

## 2017-08-05 NOTE — Progress Notes (Signed)
Report called in to [facility] took report. Patient informed on transfer and family updated.

## 2017-08-05 NOTE — Discharge Instructions (Signed)
Urinary Tract Infection, Adult  A urinary tract infection (UTI) is an infection of any part of the urinary tract, which includes the kidneys, ureters, bladder, and urethra. These organs make, store, and get rid of urine in the body. UTI can be a bladder infection (cystitis) or kidney infection (pyelonephritis).  What are the causes?  This infection may be caused by fungi, viruses, or bacteria. Bacteria are the most common cause of UTIs. This condition can also be caused by repeated incomplete emptying of the bladder during urination.  What increases the risk?  This condition is more likely to develop if:   You ignore your need to urinate or hold urine for long periods of time.   You do not empty your bladder completely during urination.   You wipe back to front after urinating or having a bowel movement, if you are male.   You are uncircumcised, if you are male.   You are constipated.   You have a urinary catheter that stays in place (indwelling).   You have a weak defense (immune) system.   You have a medical condition that affects your bowels, kidneys, or bladder.   You have diabetes.   You take antibiotic medicines frequently or for long periods of time, and the antibiotics no longer work well against certain types of infections (antibiotic resistance).   You take medicines that irritate your urinary tract.   You are exposed to chemicals that irritate your urinary tract.   You are male.    What are the signs or symptoms?  Symptoms of this condition include:   Fever.   Frequent urination or passing small amounts of urine frequently.   Needing to urinate urgently.   Pain or burning with urination.   Urine that smells bad or unusual.   Cloudy urine.   Pain in the lower abdomen or back.   Trouble urinating.   Blood in the urine.   Vomiting or being less hungry than normal.   Diarrhea or abdominal pain.   Vaginal discharge, if you are male.    How is this diagnosed?  This condition is  diagnosed with a medical history and physical exam. You will also need to provide a urine sample to test your urine. Other tests may be done, including:   Blood tests.   Sexually transmitted disease (STD) testing.    If you have had more than one UTI, a cystoscopy or imaging studies may be done to determine the cause of the infections.  How is this treated?  Treatment for this condition often includes a combination of two or more of the following:   Antibiotic medicine.   Other medicines to treat less common causes of UTI.   Over-the-counter medicines to treat pain.   Drinking enough water to stay hydrated.    Follow these instructions at home:   Take over-the-counter and prescription medicines only as told by your health care provider.   If you were prescribed an antibiotic, take it as told by your health care provider. Do not stop taking the antibiotic even if you start to feel better.   Avoid alcohol, caffeine, tea, and carbonated beverages. They can irritate your bladder.   Drink enough fluid to keep your urine clear or pale yellow.   Keep all follow-up visits as told by your health care provider. This is important.   Make sure to:  ? Empty your bladder often and completely. Do not hold urine for long periods of time.  ?   Empty your bladder before and after sex.  ? Wipe from front to back after a bowel movement if you are male. Use each tissue one time when you wipe.  Contact a health care provider if:   You have back pain.   You have a fever.   You feel nauseous or vomit.   Your symptoms do not get better after 3 days.   Your symptoms go away and then return.  Get help right away if:   You have severe back pain or lower abdominal pain.   You are vomiting and cannot keep down any medicines or water.  This information is not intended to replace advice given to you by your health care provider. Make sure you discuss any questions you have with your health care provider.  Document Released:  01/13/2005 Document Revised: 09/17/2015 Document Reviewed: 02/24/2015  Elsevier Interactive Patient Education  2018 [employer]

## 2017-08-05 NOTE — Care Management Note (Signed)
Case Management Note  Patient Details  Name: Eric [name] MRN: [id number] Date of Birth: 1923/12/30  Subjective/Objective:                    Action/Plan: Pt discharging to SNF. No further needs per CM.  Expected Discharge Date:  08/05/17               Expected Discharge Plan:  Skilled Nursing Facility  In-House Referral:  Clinical Social Work  Discharge planning Services     Post Acute Care Choice:    Choice offered to:     DME Arranged:    DME Agency:     HH Arranged:    [place] [facility] Agency:     Status of Service:  Completed, signed off  If discussed at [employer], dates discussed:    Additional Comments:  Pollie Friar, RN 08/05/2017, 12:51 PM

## 2017-08-05 NOTE — NC FL2 (Signed)
[place] LEVEL OF CARE SCREENING TOOL     IDENTIFICATION  Patient Name: Eric [name] Birthdate: [birth year] Sex: male Admission Date (Current Location): 08/02/2017  [facility] and [place] Number:  Herbalist and Address:  [facility], [place] [address], [place], [place] [address]      Provider Number: [id number]  Attending Physician Name and Address:  Mariel Aloe, MD  Relative Name and Phone Number:       Current Level of Care: Hospital Recommended Level of Care: [place] Prior Approval Number:    Date Approved/Denied:   PASRR Number: [id number]  Discharge Plan: SNF    Current Diagnoses: Patient Active Problem List   Diagnosis Date Noted  . HCAP (healthcare-associated pneumonia)   . Severe sepsis ([place])   . Sepsis due to pneumonia ([place]) 10/30/2016  . History of DVT (deep vein thrombosis) 10/30/2016  . UTI (urinary tract infection) 10/03/2016  . Memory loss 06/22/2016  . AKI (acute kidney injury) ([place]) 11/12/2014  . TIA (transient ischemic attack) 11/09/2014  . Seizure ([place]) 02/08/2012  . Lesion of lung 02/08/2012  . DM2 (diabetes mellitus, type 2) ([place]) 02/08/2012  . Hypertensive encephalopathy 01/06/2012  . Hyperglycemia 01/06/2012  . MGUS (monoclonal gammopathy of unknown significance) 10/28/2011  . Bilateral leg edema 10/28/2011  . Fall at home 10/28/2011  . Failure to thrive in adult 10/28/2011  . Type 2 diabetes mellitus with hypoglycemia ([place]) 10/27/2011  . Dysphagia 10/27/2011  . Encephalopathy 10/27/2011  . Hypernatremia 10/24/2011  . Altered mental status 10/20/2011  . Generalized weakness 10/18/2011  . Acute metabolic encephalopathy   . Dependent edema 10/02/2011  . Hyponatremia 10/02/2011  . Pneumonia 04/24/2011  . CKD (chronic kidney disease) stage 3, GFR 30-59 ml/min ([place]) 04/19/2011  . Bronchitis, acute 04/16/2011  . Edema 03/30/2011  . OSTEOARTHRITIS 03/04/2010  . LIVER  DISORDER 12/09/2009  . BACK PAIN, LUMBAR 09/09/2009  . ABDOMINAL PAIN, UNSPECIFIED SITE 01/31/2009  . SYNCOPE 01/08/2009  . ELBOW PAIN, LEFT 12/30/2008  . ABNORMAL ELECTROCARDIOGRAM 12/30/2008  . DEPRESSION 12/25/2008  . WEIGHT LOSS 12/25/2008  . NOSEBLEED 12/25/2008  . VITAMIN B12 DEFICIENCY 10/24/2008  . DM (diabetes mellitus), type 1, uncontrolled w/ophthalmic complication ([place]) 23/55/7322  . PANCYTOPENIA 10/08/2008  . PSA, INCREASED 10/08/2008  . DYSURIA 10/03/2008  . LEG PAIN, BILATERAL 12/26/2007  . KNEE PAIN, LEFT 08/18/2007  . COUGH 04/18/2007  . Hyperlipidemia 02/17/2007  . ANEMIA-NOS 02/17/2007  . Essential hypertension 02/17/2007  . ALLERGIC RHINITIS 02/17/2007  . GERD 02/17/2007  . GASTROPARESIS 02/17/2007  . Benign prostatic hyperplasia 02/17/2007  . HYPERURICEMIA 02/17/2007    Orientation RESPIRATION BLADDER Height & Weight     Self, Place  Normal Incontinent, External catheter Weight: 170 lb (77.1 kg) Height:  6' (182.9 cm)  BEHAVIORAL SYMPTOMS/MOOD NEUROLOGICAL BOWEL NUTRITION STATUS      Continent Diet(cardiac; carb modified)  AMBULATORY STATUS COMMUNICATION OF NEEDS Skin   Extensive Assist Verbally Normal                       Personal Care Assistance Level of Assistance  Bathing, Dressing Bathing Assistance: Maximum assistance   Dressing Assistance: Maximum assistance     Functional Limitations Info             SPECIAL CARE FACTORS FREQUENCY  PT (By licensed PT), OT (By licensed OT)     PT Frequency: 5/wk OT Frequency: 5/wk  Contractures      Additional Factors Info  Code Status, Allergies, Isolation Precautions Code Status Info: DNR Allergies Info: Percocet Oxycodone-acetaminophen     Isolation Precautions Info: MRSA     Current Medications (08/05/2017):  This is the current hospital active medication list Current Facility-Administered Medications  Medication Dose Route Frequency Provider Last Rate Last Dose   . acetaminophen (TYLENOL) tablet 650 mg  650 mg Oral Q6H PRN Crosley, Debby, MD       Or  . acetaminophen (TYLENOL) suppository 650 mg  650 mg Rectal Q6H PRN Crosley, Debby, MD      . amLODipine (NORVASC) tablet 10 mg  10 mg Oral QPM Crosley, Debby, MD   10 mg at 08/04/17 1741  . cefdinir (OMNICEF) capsule 300 mg  300 mg Oral Daily Mariel Aloe, MD      . cloNIDine (CATAPRES) tablet 0.1 mg  0.1 mg Oral BID Crosley, Debby, MD   0.1 mg at 08/05/17 1023  . donepezil (ARICEPT) tablet 10 mg  10 mg Oral QHS Crosley, Debby, MD   10 mg at 08/04/17 2217  . heparin injection 5,000 Units  5,000 Units Subcutaneous Q8H Quintella Baton, MD   5,000 Units at 08/05/17 0550  . hydrALAZINE (APRESOLINE) injection 5 mg  5 mg Intravenous Q4H PRN Crosley, Debby, MD      . hydrALAZINE (APRESOLINE) tablet 75 mg  75 mg Oral TID Claria Dice, Debby, MD   75 mg at 08/05/17 1023  . hydrOXYzine (ATARAX/VISTARIL) tablet 25 mg  25 mg Oral TID PRN Crosley, Debby, MD      . insulin aspart (novoLOG) injection 0-9 Units  0-9 Units Subcutaneous TID WC Annita Brod, MD   2 Units at 08/04/17 1742  . insulin glargine (LANTUS) injection 25 Units  25 Units Subcutaneous q morning - 10a Quintella Baton, MD   25 Units at 08/05/17 1022  . latanoprost (XALATAN) 0.005 % ophthalmic solution 1 drop  1 drop Both Eyes QHS Crosley, Debby, MD   1 drop at 08/05/17 0122  . loratadine (CLARITIN) tablet 10 mg  10 mg Oral Q lunch Crosley, Debby, MD   10 mg at 08/04/17 1318  . metoprolol succinate (TOPROL-XL) 24 hr tablet 50 mg  50 mg Oral Daily Crosley, Debby, MD   50 mg at 08/05/17 1024  . ondansetron (ZOFRAN) tablet 4 mg  4 mg Oral Q6H PRN Crosley, Debby, MD       Or  . ondansetron (ZOFRAN) injection 4 mg  4 mg Intravenous Q6H PRN Crosley, Debby, MD      . pantoprazole (PROTONIX) EC tablet 40 mg  40 mg Oral Daily Crosley, Debby, MD   40 mg at 08/05/17 1023  . phenytoin (DILANTIN) ER capsule 300 mg  300 mg Oral QHS Crosley, Debby, MD   300 mg at  08/04/17 2217  . polyethylene glycol (MIRALAX / GLYCOLAX) packet 17 g  17 g Oral Daily PRN Quintella Baton, MD         Discharge Medications: Please see discharge summary for a list of discharge medications.  Relevant Imaging Results:  Relevant Lab Results:   Additional Information SSN [id number]  Jorge Ny, LCSW

## 2017-08-22 ENCOUNTER — Ambulatory Visit: Payer: Medicare Other | Admitting: Neurology

## 2017-08-22 ENCOUNTER — Encounter: Payer: Self-pay | Admitting: Neurology

## 2017-08-22 VITALS — BP 118/65 | HR 58 | Ht 69.0 in | Wt 176.0 lb

## 2017-08-22 DIAGNOSIS — G4719 Other hypersomnia: Secondary | ICD-10-CM | POA: Diagnosis not present

## 2017-08-22 NOTE — Progress Notes (Signed)
[facility]  PATIENT: Eric [name] DOB: Dec 31, 1923   REASON FOR VISIT: follow up for TIA  , memory loss HISTORY FROM: patient and caregiver  HISTORY OF PRESENT ILLNESS: HISTORY 12/30/14 PS89 year African-American male seen today for first office follow-up visit for TIA from hospital admission in July 2016.84yo man with PMH of HTN, GERD< DM, BPH, HLD, CKD, Grade 1 diastolic CHF, dementia who presents for dysarthria. His symptoms started today at about 230pm after church. He was talking to a friend and noticed that his words were garbled. He knew what he was trying to say, but could not get it out. He also had right leg weakness and had to be helped out of his chair. The leg weakness has happened once before, Wednesday of this week, after exercising at the [facility]. His symptoms resolved by the time EMS had arrived. He has never had a stroke before. He denies falling, sensation changes, lightheadedness, dizziness, recent illness, nausea, vomiting, chest pain, SOB. Risk factors for stroke include HTN, DM, HLD. CT head done in the ED showed no acute pathology. MRI showed no acute pathology, mild atherosclerosis. Carotid Doppler showed no significant extracranial stenosis. MRA of the brain showed no large vessel stenosis. LDL cholesterol was elevated and hemoglobin A1c was 6.5. Patient was started on Plavix and is tolerating it well without bleeding or bruising. He also has long-standing history of mild dementia which appears stable on Aricept. He lives at home but his family provides some support and checks on him frequently. He walks with a cane because of his arthritis but has not had any major falls. Update 06/19/2015 PS: He returns for follow-up after last visit 6 months ago. He is accompanied by his sister and a provides most of the history. Patient continues to live alone but has AIDS during the daytime hours and is alone at night. Patient's sister lives nearby and checks on  him daily. His medications are given to him supervised. He has an emergency and that system. He is had no accident accidents, falls or injuries. The sister feels that he is cognitively doing a little better even though on the Mini-Mental status exam today he scored 18/30 which is a decline from 21/30 at last visit. Patient does not socialize a lot chart and spends most of his time at home. He is involved only in going out for church related activities. He has not had any agitations, delusions, hallucinations or unsafe behavior. He has had no recurrent TIA or stroke symptoms. He is tolerating aspirin without bleeding or bruising. His blood pressure is well controlled and today it is 124/72. He is tolerating Lipitor well without side effects. His fasting sugars have been well controlled UPDATE 9/5/17CM Mr Eric [name], 84 year old male returns for follow-up with his sister. Patient continues to live alone but she checks on him frequently. He has assistance during the day. No recent falls. They both feel his memory is better at present. No wandering behavior and difficulty sleeping no hallucinations. He has not had recurrent TIA or stroke symptoms and is tolerating aspirin without significant bruising. He is also on Lipitor without muscle aches or myalgias. He returns for reevaluation UPDATE 03/06/2018CM [name], 84 year old male returns for follow-up with his caregiver. He has a history of TIA  In July 2016. He is currently on Coumadin for secondary stroke prevention and hx of DVT.  He also has a history of chronic lower extremity edema secondary to DVT. His aspirin was changed to Coumadin  by his primary care. Patient continues to live in his own home but has someone checks on him frequently to administer medications and prepare meals. He also needs some help with bathing and dressing. Blood pressure in the office today 140/80. He remains on Lipitor for hypercholesterolemia, denies any myalgias. He continues to have  memory loss which predated his stroke. He is on Aricept 10 mg daily. He continues to be involved in church activities. Caregiver reports there have been no safety issues His caregiver feels his memory is stable. He returns for reevaluation Update 08/22/2017 : returns for follow-up after last visit a year ago with Gilford Raid, nurse practitioner. He is accompanied by his aide and sister. They're concerned that he may be having possible Compex partial seizures. The patient for the last couple of months has been having intermittent episodes of dozing off and been briefly unresponsive. He can pick up right away when aroused and is not disoriented or confused. There has been no witnessed tonic-clonic activity tongue bite mark twitchings noted. Patient was previously on phenytoin falling remote seizures and apparently had done well until recently. For some unclear reason his phenytoin dose was reduced from 300 mg at night to only 100 mg. The patient was diagnosed with urinary tract infection on and was admitted last month to the hospital. He is been on antibiotics. But since then his been quite sleepy during the day he can doze off easily in the middle of conversations. He can be aroused easily and does not appear to be disoriented or confused when aroused. There is no weakness tongue bite or tonic-clonic activity of her twitchings noted. The patient has not been sleeping well consistently at night. He is presently in a nursing home for rehabilitation. Is able to walk with a walker but does need one person assist. He does fall frequently.he was recently discharged from the hospital on 08/05/17 falling Escherichia coli urinary tract infection.I have reviewed the hospital discharge summary. CT scan of the head was obtained which showed no acute abnormality. Dilantin level was 6.9. EEG was not obtained. REVIEW OF SYSTEMS: Full 14 system review of systems performed and notable only for those listed, all others are neg:    runny nose, wheezing, shortness of breath, leg swelling, excessive thirst, constipation, daytime sleepiness, snoring, aching muscles, weakness, tremors, rash, itching and falls. Recent UTI. All other systems negative  ALLERGIES: Allergies  Allergen Reactions  . Percocet [Oxycodone-Acetaminophen] Rash    HOME MEDICATIONS: Outpatient Medications Prior to Visit  Medication Sig Dispense Refill  . ACCU-CHEK AVIVA PLUS test strip     . acetaminophen (TYLENOL) 500 MG tablet Take 500 mg by mouth 2 (two) times daily. Reported on 06/19/2015    . amLODipine (NORVASC) 10 MG tablet Take 10 mg by mouth every evening.     . B-D UF III MINI PEN NEEDLES 31G X 5 MM MISC   3  . calamine lotion Apply 1 application topically 2 (two) times daily as needed for itching (to shoulders/ back).    . calcitRIOL (ROCALTROL) 0.25 MCG capsule Take 0.25 mcg by mouth daily.     . cholecalciferol (VITAMIN D) 1000 UNITS tablet Take 2,000 Units by mouth daily.     . cloNIDine (CATAPRES - DOSED IN MG/24 HR) 0.2 mg/24hr patch     . cloNIDine (CATAPRES) 0.1 MG tablet Take 0.1 mg by mouth 2 (two) times daily.     . diclofenac sodium (VOLTAREN) 1 % GEL Apply 2 g topically 4 (four)  times daily. 1 Tube 5  . docusate sodium (COLACE) 100 MG capsule Take 100 mg by mouth daily.    Marland Kitchen donepezil (ARICEPT) 10 MG tablet Take 1 tablet (10 mg total) by mouth at bedtime. 30 tablet 6  . fluticasone (FLONASE) 50 MCG/ACT nasal spray Place 1 spray into both nostrils 2 (two) times daily. (Patient taking differently: Place 1 spray into both nostrils 2 (two) times daily as needed for allergies. )  2  . hydrALAZINE (APRESOLINE) 25 MG tablet Take 75 mg by mouth 3 (three) times daily.   1  . hydrOXYzine (ATARAX/VISTARIL) 10 MG tablet TAKE 1 TABLET BY MOUTH TWICE DAILY AS NEEDED FOR ITCHING  1  . insulin aspart (NOVOLOG FLEXPEN) 100 UNIT/ML FlexPen Inject 0-5 Units into the skin 3 (three) times daily as needed for high blood sugar (sliding scale).    .  Insulin Glargine (LANTUS SOLOSTAR) 100 UNIT/ML Solostar Pen Inject 25 Units into the skin every morning.     . latanoprost (XALATAN) 0.005 % ophthalmic solution Place 1 drop into both eyes at bedtime.    Marland Kitchen loratadine (CLARITIN) 10 MG tablet Take 10 mg by mouth daily with lunch.    . LUMIGAN 0.01 % SOLN Place 1 drop into both eyes at bedtime.  2  . metoprolol succinate (TOPROL-XL) 50 MG 24 hr tablet Take 50 mg by mouth daily.    Marland Kitchen OVER THE COUNTER MEDICATION Apply 1 application topically 4 (four) times daily as needed (clotrimazole= to the buttocks for rash as needed).     . pantoprazole (PROTONIX) 40 MG tablet Take 40 mg by mouth daily.     . phenytoin (DILANTIN) 100 MG ER capsule Take 200 mg by mouth daily.    Vladimir Faster Glycol-Propyl Glycol (SYSTANE ULTRA) 0.4-0.3 % SOLN Place 1 drop into both eyes 3 (three) times daily as needed (dry eyes).    . polyethylene glycol powder (GLYCOLAX/MIRALAX) powder Take 17 g by mouth daily as needed for mild constipation.   12  . Probiotic Product (PROBIOTIC-10 PO) Take 1 capsule by mouth daily.    Marland Kitchen senna (SENOKOT) 8.6 MG TABS Take 1 tablet by mouth daily.    . Triamcinolone Acetonide (NASACORT AQ NA) Place 1 spray into both nostrils daily as needed (for allergies).      No facility-administered medications prior to visit.     PAST MEDICAL HISTORY: Past Medical History:  Diagnosis Date  . Adult failure to thrive   . Altered mental status   . ANEMIA-NOS 02/17/2007  . Anxiety   . BACK PAIN, LUMBAR   . BENIGN PROSTATIC HYPERTROPHY   . Bilateral leg edema   . CAP (community acquired pneumonia)   . CKD (chronic kidney disease)   . DEPRESSION 12/25/2008  . Diabetes mellitus   . DIABETES MELLITUS, TYPE I, CONTROLLED, WITH RETINOPATHY   . Encephalopathy, metabolic   . Gastroparesis   . GERD 02/17/2007  . Hyperlipidemia   . HYPERTENSION 02/17/2007  . Hyponatremia   . LIVER DISORDER   . OSTEOARTHRITIS 03/04/2010  . Pneumonia   . PSA, INCREASED   .  Stroke ([place])   . UTI (urinary tract infection) 07/2017  . VITAMIN B12 DEFICIENCY     PAST SURGICAL HISTORY: Past Surgical History:  Procedure Laterality Date  . LEFT HEART CATHETERIZATION WITH CORONARY ANGIOGRAM N/A 04/23/2011   Procedure: LEFT HEART CATHETERIZATION WITH CORONARY ANGIOGRAM;  Surgeon: Peter M Martinique, MD;  Location: [facility] CATH LAB;  Service: Cardiovascular;  Laterality: N/A;  .  TRANSURETHRAL RESECTION OF PROSTATE      FAMILY HISTORY: Family History  Problem Relation Age of Onset  . Cancer Father        had uncertain type of cancer  . Cancer Brother        Prostate Cancer  . Cancer Brother        Prostate Cancer  . Diabetes Other     SOCIAL HISTORY: Social History   Socioeconomic History  . Marital status: Widowed    Spouse name: Not on file  . Number of children: Not on file  . Years of education: Not on file  . Highest education level: Not on file  Occupational History  . 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 Financial resource strain: Not on file  . Food insecurity:    Worry: Not on file    Inability: Not on file  . Transportation needs:    Medical: Not on file    Non-medical: Not on file  Tobacco Use  . Smoking status: Never Smoker  . Smokeless tobacco: Never Used  Substance and Sexual Activity  . Alcohol use: No  . Drug use: No  . Sexual activity: Never  Lifestyle  . Physical activity:    Days per week: Not on file    Minutes per session: Not on file  . Stress: Not on file  Relationships  . Social connections:    Talks on phone: Not on file    Gets together: Not on file    Attends religious service: Not on file    Active member of club or organization: Not on file    Attends meetings of clubs or organizations: Not on file    Relationship status: Not on file  . Intimate partner violence:    Fear of current or ex partner: Not on file    Emotionally abused: Not on file    Physically abused: Not  on file    Forced sexual activity: Not on file  Other Topics Concern  . Not on file  Social History Narrative   Works Armed forces operational officer.   Widowed 2010.   Regular exercise-no     PHYSICAL EXAM  Vitals:   06/22/16 0854  BP: (!) 140/80  Pulse: 79  Weight: 185 lb (83.9 kg)   Body mass index is 25.99 kg/m.  Generalized: frail, elderly male in no acute distress  Head: normocephalic and atraumatic,.   Neck: Supple, no carotid bruits  Cardiac: Regular rate rhythm, no murmur  Musculoskeletal: Severe kyphosis Skin bilateral pedal edema  Neurological examination   Mentation: Alert oriented to time, place, MMSE not done Last visit 16/30. history taking. Attention span and concentration diminished. Recent and remote memory poor.  Follows all commands speech and language fluent.   Cranial nerve II-XII: Fundoscopic exam not donePupils were equal round reactive to light extraocular movements were full, visual field were full on confrontational test. Facial sensation and strength were normal. hearing was intact to finger rubbing bilaterally. Uvula tongue midline. head turning and shoulder shrug were normal and symmetric.Tongue protrusion into cheek strength was normal. Motor: normal bulk and tone, full strength in the BUE, BLE, fine finger movements normal, no pronator drift.  Sensory: normal and symmetric to light touch, pinprick, and  Vibration, in the upper and lower extremities Coordination: finger-nose-finger, heel-to-shin bilaterally, no dysmetria, no tremor Reflexes: 1+ upper lower and symmetric, plantar responses were flexor bilaterally. Gait and Station: deferred as patient did  not bring his walker. DIAGNOSTIC DATA (LABS, IMAGING, TESTING) - I reviewed patient records, labs, notes, testing and imaging myself where available.  Lab Results  Component Value Date   WBC 6.7 08/05/2017   HGB 8.5 (L) 08/05/2017   HCT 25.5 (L) 08/05/2017   MCV 100.8 (H) 08/05/2017   PLT 147 (L)  08/05/2017      Component Value Date/Time   NA 138 08/05/2017 0739   K 3.6 08/05/2017 0739   CL 112 (H) 08/05/2017 0739   CO2 17 (L) 08/05/2017 0739   GLUCOSE 112 (H) 08/05/2017 0739   BUN 41 (H) 08/05/2017 0739   CREATININE 2.18 (H) 08/05/2017 0739   CALCIUM 8.4 (L) 08/05/2017 0739   CALCIUM 8.8 04/29/2011 1005   PROT 7.3 08/02/2017 1929   ALBUMIN 3.0 (L) 08/02/2017 1929   AST 25 08/02/2017 1929   ALT 21 08/02/2017 1929   ALKPHOS 160 (H) 08/02/2017 1929   BILITOT 0.6 08/02/2017 1929   GFRNONAA 25 (L) 08/05/2017 0739   GFRAA 29 (L) 08/05/2017 0739   Lab Results  Component Value Date   CHOL 159 11/10/2014   HDL 31 (L) 11/10/2014   LDLCALC 106 (H) 11/10/2014   LDLDIRECT 40.5 10/28/2006   TRIG 112 11/10/2014   CHOLHDL 5.1 11/10/2014   Lab Results  Component Value Date   HGBA1C 6.5 (H) 11/10/2014       ASSESSMENT AND PLAN  54 year African-American male with left hemispheric TIA in July 2016 with vascular risk factors of hypertension, hyperlipidemia, age and sex. Chronic DVT.  Mild underlying dementia which appears stable. New complaints of dozing off and excessive daytime sleepiness likely related to altered sleep-wake cycle in the setting of urinary tract infection. Remote history of seizures but suboptimal Dilantin dosing and levels.    I had a long discussion with the patient, his sister and aide regarding his episodes of brief daytime dozing off which probably represent excessive daytime sleepiness related to a disturbed sleep-wake cycle and underlying urinary tract infection. He does however have history of seizures and his Dilantin dose as well as recent levels are suboptimal hence recommend increasing dose to 200 mg daily and checking levels in 2 weeks. Check EEG. Continue Aricept for memory loss and aggressive risk factor modification for stroke prevention. Use cane at all times for safe ambulation and to prevent falls  He'll return for follow-up in 3 months with my  nurse practitioner Janett Billow or call earlier if necessary. Antony Contras, MD [facility] [address], [place], [place] [address] [phone]

## 2017-08-22 NOTE — Patient Instructions (Signed)
I had a long discussion with the patient, his sister and aide regarding his episodes of brief daytime dozing off which probably represent excessive daytime sleepiness related to a disturbed sleep-wake cycle and underlying urinary tract infection. He does however have history of seizures and his Dilantin dose as well as recent levels are suboptimal hence recommend increasing dose to 200 mg daily and checking levels in 2 weeks. Check EEG. Continue Aricept for memory loss and aggressive risk factor modification for stroke prevention. He'll return for follow-up in 3 months with my nurse practitioner Janett Billow or call earlier if necessary.

## 2017-08-26 ENCOUNTER — Emergency Department (HOSPITAL_COMMUNITY): Payer: Medicare Other

## 2017-08-26 ENCOUNTER — Encounter (HOSPITAL_COMMUNITY): Payer: Self-pay | Admitting: Internal Medicine

## 2017-08-26 ENCOUNTER — Inpatient Hospital Stay (HOSPITAL_COMMUNITY)
Admission: EM | Admit: 2017-08-26 | Discharge: 2017-08-29 | DRG: 070 | Disposition: A | Discharge status: Home Health | Payer: Medicare Other | Attending: Internal Medicine | Admitting: Internal Medicine

## 2017-08-26 DIAGNOSIS — G9341 Metabolic encephalopathy: Secondary | ICD-10-CM | POA: Diagnosis present

## 2017-08-26 DIAGNOSIS — Z833 Family history of diabetes mellitus: Secondary | ICD-10-CM

## 2017-08-26 DIAGNOSIS — R627 Adult failure to thrive: Secondary | ICD-10-CM | POA: Diagnosis present

## 2017-08-26 DIAGNOSIS — F0390 Unspecified dementia without behavioral disturbance: Secondary | ICD-10-CM | POA: Diagnosis present

## 2017-08-26 DIAGNOSIS — J810 Acute pulmonary edema: Secondary | ICD-10-CM | POA: Diagnosis not present

## 2017-08-26 DIAGNOSIS — R4182 Altered mental status, unspecified: Secondary | ICD-10-CM | POA: Diagnosis present

## 2017-08-26 DIAGNOSIS — R569 Unspecified convulsions: Secondary | ICD-10-CM

## 2017-08-26 DIAGNOSIS — I5031 Acute diastolic (congestive) heart failure: Secondary | ICD-10-CM | POA: Diagnosis present

## 2017-08-26 DIAGNOSIS — N400 Enlarged prostate without lower urinary tract symptoms: Secondary | ICD-10-CM | POA: Diagnosis present

## 2017-08-26 DIAGNOSIS — E785 Hyperlipidemia, unspecified: Secondary | ICD-10-CM | POA: Diagnosis present

## 2017-08-26 DIAGNOSIS — I5030 Unspecified diastolic (congestive) heart failure: Secondary | ICD-10-CM | POA: Diagnosis present

## 2017-08-26 DIAGNOSIS — J9610 Chronic respiratory failure, unspecified whether with hypoxia or hypercapnia: Secondary | ICD-10-CM | POA: Diagnosis present

## 2017-08-26 DIAGNOSIS — I5033 Acute on chronic diastolic (congestive) heart failure: Secondary | ICD-10-CM | POA: Diagnosis present

## 2017-08-26 DIAGNOSIS — R748 Abnormal levels of other serum enzymes: Secondary | ICD-10-CM | POA: Diagnosis present

## 2017-08-26 DIAGNOSIS — D649 Anemia, unspecified: Secondary | ICD-10-CM

## 2017-08-26 DIAGNOSIS — I674 Hypertensive encephalopathy: Secondary | ICD-10-CM | POA: Diagnosis present

## 2017-08-26 DIAGNOSIS — Z8701 Personal history of pneumonia (recurrent): Secondary | ICD-10-CM

## 2017-08-26 DIAGNOSIS — N183 Chronic kidney disease, stage 3 unspecified: Secondary | ICD-10-CM | POA: Diagnosis present

## 2017-08-26 DIAGNOSIS — K3184 Gastroparesis: Secondary | ICD-10-CM | POA: Diagnosis present

## 2017-08-26 DIAGNOSIS — M1990 Unspecified osteoarthritis, unspecified site: Secondary | ICD-10-CM | POA: Diagnosis present

## 2017-08-26 DIAGNOSIS — E1043 Type 1 diabetes mellitus with diabetic autonomic (poly)neuropathy: Secondary | ICD-10-CM | POA: Diagnosis present

## 2017-08-26 DIAGNOSIS — I130 Hypertensive heart and chronic kidney disease with heart failure and stage 1 through stage 4 chronic kidney disease, or unspecified chronic kidney disease: Secondary | ICD-10-CM | POA: Diagnosis present

## 2017-08-26 DIAGNOSIS — Z87440 Personal history of urinary (tract) infections: Secondary | ICD-10-CM

## 2017-08-26 DIAGNOSIS — R778 Other specified abnormalities of plasma proteins: Secondary | ICD-10-CM | POA: Diagnosis present

## 2017-08-26 DIAGNOSIS — I2510 Atherosclerotic heart disease of native coronary artery without angina pectoris: Secondary | ICD-10-CM | POA: Diagnosis present

## 2017-08-26 DIAGNOSIS — R404 Transient alteration of awareness: Secondary | ICD-10-CM | POA: Diagnosis present

## 2017-08-26 DIAGNOSIS — R001 Bradycardia, unspecified: Secondary | ICD-10-CM | POA: Diagnosis present

## 2017-08-26 DIAGNOSIS — K219 Gastro-esophageal reflux disease without esophagitis: Secondary | ICD-10-CM | POA: Diagnosis present

## 2017-08-26 DIAGNOSIS — E1022 Type 1 diabetes mellitus with diabetic chronic kidney disease: Secondary | ICD-10-CM | POA: Diagnosis present

## 2017-08-26 DIAGNOSIS — I441 Atrioventricular block, second degree: Secondary | ICD-10-CM | POA: Diagnosis not present

## 2017-08-26 DIAGNOSIS — E1122 Type 2 diabetes mellitus with diabetic chronic kidney disease: Secondary | ICD-10-CM | POA: Diagnosis not present

## 2017-08-26 DIAGNOSIS — L89322 Pressure ulcer of left buttock, stage 2: Secondary | ICD-10-CM | POA: Diagnosis present

## 2017-08-26 DIAGNOSIS — D472 Monoclonal gammopathy: Secondary | ICD-10-CM | POA: Diagnosis present

## 2017-08-26 DIAGNOSIS — I10 Essential (primary) hypertension: Secondary | ICD-10-CM | POA: Diagnosis not present

## 2017-08-26 DIAGNOSIS — Z885 Allergy status to narcotic agent status: Secondary | ICD-10-CM

## 2017-08-26 DIAGNOSIS — E10319 Type 1 diabetes mellitus with unspecified diabetic retinopathy without macular edema: Secondary | ICD-10-CM | POA: Diagnosis present

## 2017-08-26 DIAGNOSIS — Z8673 Personal history of transient ischemic attack (TIA), and cerebral infarction without residual deficits: Secondary | ICD-10-CM

## 2017-08-26 DIAGNOSIS — Z794 Long term (current) use of insulin: Secondary | ICD-10-CM

## 2017-08-26 DIAGNOSIS — Z79899 Other long term (current) drug therapy: Secondary | ICD-10-CM

## 2017-08-26 DIAGNOSIS — I361 Nonrheumatic tricuspid (valve) insufficiency: Secondary | ICD-10-CM | POA: Diagnosis not present

## 2017-08-26 DIAGNOSIS — L89312 Pressure ulcer of right buttock, stage 2: Secondary | ICD-10-CM | POA: Diagnosis present

## 2017-08-26 DIAGNOSIS — E119 Type 2 diabetes mellitus without complications: Secondary | ICD-10-CM

## 2017-08-26 DIAGNOSIS — D631 Anemia in chronic kidney disease: Secondary | ICD-10-CM | POA: Diagnosis present

## 2017-08-26 DIAGNOSIS — Z9181 History of falling: Secondary | ICD-10-CM

## 2017-08-26 DIAGNOSIS — R7989 Other specified abnormal findings of blood chemistry: Secondary | ICD-10-CM | POA: Diagnosis present

## 2017-08-26 DIAGNOSIS — G40909 Epilepsy, unspecified, not intractable, without status epilepticus: Secondary | ICD-10-CM | POA: Diagnosis present

## 2017-08-26 DIAGNOSIS — D539 Nutritional anemia, unspecified: Secondary | ICD-10-CM | POA: Diagnosis present

## 2017-08-26 DIAGNOSIS — L8990 Pressure ulcer of unspecified site, unspecified stage: Secondary | ICD-10-CM

## 2017-08-26 HISTORY — DX: Atherosclerotic heart disease of native coronary artery without angina pectoris: I25.10

## 2017-08-26 LAB — IRON AND TIBC
Iron: 42 ug/dL — ABNORMAL LOW (ref 45–182)
SATURATION RATIOS: 22 % (ref 17.9–39.5)
TIBC: 192 ug/dL — ABNORMAL LOW (ref 250–450)
UIBC: 150 ug/dL

## 2017-08-26 LAB — URINALYSIS, ROUTINE W REFLEX MICROSCOPIC
Bilirubin Urine: NEGATIVE
Glucose, UA: NEGATIVE mg/dL
Ketones, ur: NEGATIVE mg/dL
Leukocytes, UA: NEGATIVE
Nitrite: NEGATIVE
PROTEIN: 30 mg/dL — AB
SPECIFIC GRAVITY, URINE: 1.004 — AB (ref 1.005–1.030)
pH: 7 (ref 5.0–8.0)

## 2017-08-26 LAB — COMPREHENSIVE METABOLIC PANEL
ALK PHOS: 123 U/L (ref 38–126)
ALT: 26 U/L (ref 17–63)
AST: 25 U/L (ref 15–41)
Albumin: 2.7 g/dL — ABNORMAL LOW (ref 3.5–5.0)
Anion gap: 9 (ref 5–15)
BUN: 29 mg/dL — AB (ref 6–20)
CALCIUM: 8.7 mg/dL — AB (ref 8.9–10.3)
CHLORIDE: 107 mmol/L (ref 101–111)
CO2: 21 mmol/L — AB (ref 22–32)
CREATININE: 2.24 mg/dL — AB (ref 0.61–1.24)
GFR calc non Af Amer: 24 mL/min — ABNORMAL LOW (ref >60)
GFR, EST AFRICAN AMERICAN: 28 mL/min — AB (ref >60)
Glucose, Bld: 144 mg/dL — ABNORMAL HIGH (ref 65–99)
Potassium: 3.7 mmol/L (ref 3.5–5.1)
Sodium: 137 mmol/L (ref 135–145)
Total Bilirubin: 0.6 mg/dL (ref 0.3–1.2)
Total Protein: 6.6 g/dL (ref 6.5–8.1)

## 2017-08-26 LAB — CBG MONITORING, ED
GLUCOSE-CAPILLARY: 96 mg/dL (ref 65–99)
Glucose-Capillary: 159 mg/dL — ABNORMAL HIGH (ref 65–99)
Glucose-Capillary: 71 mg/dL (ref 65–99)

## 2017-08-26 LAB — RETICULOCYTES
RBC.: 3.25 MIL/uL — AB (ref 4.22–5.81)
RETIC CT PCT: 3 % (ref 0.4–3.1)
Retic Count, Absolute: 97.5 10*3/uL (ref 19.0–186.0)

## 2017-08-26 LAB — I-STAT VENOUS BLOOD GAS, ED
ACID-BASE DEFICIT: 3 mmol/L — AB (ref 0.0–2.0)
BICARBONATE: 20.9 mmol/L (ref 20.0–28.0)
O2 SAT: 66 %
PO2 VEN: 34 mmHg (ref 32.0–45.0)
TCO2: 22 mmol/L (ref 22–32)
pCO2, Ven: 34 mmHg — ABNORMAL LOW (ref 44.0–60.0)
pH, Ven: 7.398 (ref 7.250–7.430)

## 2017-08-26 LAB — FERRITIN: FERRITIN: 173 ng/mL (ref 24–336)

## 2017-08-26 LAB — CBC WITH DIFFERENTIAL/PLATELET
Basophils Absolute: 0 10*3/uL (ref 0.0–0.1)
Basophils Relative: 0 %
Eosinophils Absolute: 0 10*3/uL (ref 0.0–0.7)
Eosinophils Relative: 1 %
HCT: 30.3 % — ABNORMAL LOW (ref 39.0–52.0)
HEMOGLOBIN: 9.9 g/dL — AB (ref 13.0–17.0)
LYMPHS ABS: 1.6 10*3/uL (ref 0.7–4.0)
LYMPHS PCT: 32 %
MCH: 33 pg (ref 26.0–34.0)
MCHC: 32.7 g/dL (ref 30.0–36.0)
MCV: 101 fL — AB (ref 78.0–100.0)
MONOS PCT: 2 %
Monocytes Absolute: 0.1 10*3/uL (ref 0.1–1.0)
NEUTROS PCT: 65 %
Neutro Abs: 3.4 10*3/uL (ref 1.7–7.7)
Platelets: 327 10*3/uL (ref 150–400)
RBC: 3 MIL/uL — AB (ref 4.22–5.81)
RDW: 15.2 % (ref 11.5–15.5)
WBC: 5.1 10*3/uL (ref 4.0–10.5)

## 2017-08-26 LAB — FOLATE: Folate: 9.2 ng/mL (ref >5.9)

## 2017-08-26 LAB — LACTIC ACID, PLASMA: Lactic Acid, Venous: 1.5 mmol/L (ref 0.5–1.9)

## 2017-08-26 LAB — VITAMIN B12: VITAMIN B 12: 276 pg/mL (ref 180–914)

## 2017-08-26 LAB — PHENYTOIN LEVEL, TOTAL: Phenytoin Lvl: 2.9 ug/mL — ABNORMAL LOW (ref 10.0–20.0)

## 2017-08-26 LAB — TROPONIN I: TROPONIN I: 0.1 ng/mL — AB (ref <0.03)

## 2017-08-26 MED ORDER — FUROSEMIDE 10 MG/ML IJ SOLN
40.0000 mg | Freq: Once | INTRAMUSCULAR | Status: AC
  Administered 2017-08-26: 40 mg via INTRAVENOUS
  Filled 2017-08-26: qty 4

## 2017-08-26 MED ORDER — TRIAMCINOLONE 0.1 % CREAM:EUCERIN CREAM 1:1
TOPICAL_CREAM | Freq: Two times a day (BID) | CUTANEOUS | Status: DC | PRN
  Administered 2017-08-27: 06:00:00 via TOPICAL
  Filled 2017-08-26: qty 1

## 2017-08-26 MED ORDER — CLONIDINE HCL 0.1 MG PO TABS
0.1000 mg | ORAL_TABLET | Freq: Once | ORAL | Status: AC
  Administered 2017-08-26: 0.1 mg via ORAL
  Filled 2017-08-26: qty 1

## 2017-08-26 NOTE — ED Provider Notes (Signed)
[facility] EMERGENCY DEPARTMENT Provider Note   CSN: [id number] Arrival date & time: 08/26/17  1348  History   Chief Complaint Chief Complaint  Patient presents with  . Altered Mental Status    HPI Eric [name] is a 84 y.o. male with a past medical history of dementia, hypertension, hyperlipidemia, CKD 3, diabetes, seizures who presented to the ED following a period of decreased responsiveness.  Patient's sister assists in providing history.  She states around 1:00 this afternoon patient was noted to be staring off into space and was not responsive at the time.  This lasted about 10 minutes.  By time EMS arrived he was responsive, oriented to self and partially to place.  She denies witnessing any shaking movements or other symptoms during this episode.  Patient was recently discharged from skilled nursing facility, home health CNA last night told the sister the patient had an episode of difficulty breathing, cough with potential mucus production.    Patient currently appears at baseline mental status, does not voice any complaints apart from his back itching.   Past Medical History:  Diagnosis Date  . Adult failure to thrive   . Altered mental status   . ANEMIA-NOS 02/17/2007  . Anxiety   . BACK PAIN, LUMBAR   . BENIGN PROSTATIC HYPERTROPHY   . Bilateral leg edema   . CAP (community acquired pneumonia)   . CKD (chronic kidney disease)   . DEPRESSION 12/25/2008  . Diabetes mellitus   . DIABETES MELLITUS, TYPE I, CONTROLLED, WITH RETINOPATHY   . Encephalopathy, metabolic   . Gastroparesis   . GERD 02/17/2007  . Hyperlipidemia   . HYPERTENSION 02/17/2007  . Hyponatremia   . LIVER DISORDER   . OSTEOARTHRITIS 03/04/2010  . Pneumonia   . PSA, INCREASED   . Stroke ([place])   . UTI (urinary tract infection) 07/2017  . VITAMIN B12 DEFICIENCY     Patient Active Problem List   Diagnosis Date Noted  . Acute diastolic CHF (congestive heart failure) ([place])  08/26/2017  . Staring episodes 08/26/2017  . HCAP (healthcare-associated pneumonia)   . Severe sepsis ([place])   . Sepsis due to pneumonia ([place]) 10/30/2016  . History of DVT (deep vein thrombosis) 10/30/2016  . UTI (urinary tract infection) 10/03/2016  . Memory loss 06/22/2016  . AKI (acute kidney injury) ([place]) 11/12/2014  . TIA (transient ischemic attack) 11/09/2014  . Seizure ([place]) 02/08/2012  . Lesion of lung 02/08/2012  . DM2 (diabetes mellitus, type 2) ([place]) 02/08/2012  . Hypertensive encephalopathy 01/06/2012  . Hyperglycemia 01/06/2012  . MGUS (monoclonal gammopathy of unknown significance) 10/28/2011  . Bilateral leg edema 10/28/2011  . Fall at home 10/28/2011  . Failure to thrive in adult 10/28/2011  . Type 2 diabetes mellitus with hypoglycemia ([place]) 10/27/2011  . Dysphagia 10/27/2011  . Encephalopathy 10/27/2011  . Hypernatremia 10/24/2011  . Altered mental status 10/20/2011  . Generalized weakness 10/18/2011  . Acute metabolic encephalopathy   . Dependent edema 10/02/2011  . Hyponatremia 10/02/2011  . Pneumonia 04/24/2011  . CKD (chronic kidney disease) stage 3, GFR 30-59 ml/min ([place]) 04/19/2011  . Bronchitis, acute 04/16/2011  . Edema 03/30/2011  . OSTEOARTHRITIS 03/04/2010  . LIVER DISORDER 12/09/2009  . BACK PAIN, LUMBAR 09/09/2009  . ABDOMINAL PAIN, UNSPECIFIED SITE 01/31/2009  . SYNCOPE 01/08/2009  . ELBOW PAIN, LEFT 12/30/2008  . ABNORMAL ELECTROCARDIOGRAM 12/30/2008  . DEPRESSION 12/25/2008  . WEIGHT LOSS 12/25/2008  . NOSEBLEED 12/25/2008  . VITAMIN B12 DEFICIENCY 10/24/2008  .  DM (diabetes mellitus), type 1, uncontrolled w/ophthalmic complication ([place]) 48/54/6270  . PANCYTOPENIA 10/08/2008  . PSA, INCREASED 10/08/2008  . DYSURIA 10/03/2008  . LEG PAIN, BILATERAL 12/26/2007  . KNEE PAIN, LEFT 08/18/2007  . COUGH 04/18/2007  . Hyperlipidemia 02/17/2007  . ANEMIA-NOS 02/17/2007  . Essential hypertension 02/17/2007  . ALLERGIC RHINITIS 02/17/2007  .  GERD 02/17/2007  . GASTROPARESIS 02/17/2007  . Benign prostatic hyperplasia 02/17/2007  . HYPERURICEMIA 02/17/2007    Past Surgical History:  Procedure Laterality Date  . LEFT HEART CATHETERIZATION WITH CORONARY ANGIOGRAM N/A 04/23/2011   Procedure: LEFT HEART CATHETERIZATION WITH CORONARY ANGIOGRAM;  Surgeon: Peter M Martinique, MD;  Location: [facility] CATH LAB;  Service: Cardiovascular;  Laterality: N/A;  . TRANSURETHRAL RESECTION OF PROSTATE          Home Medications    Prior to Admission medications   Medication Sig Start Date End Date Taking? Authorizing Provider  ACCU-CHEK AVIVA PLUS test strip  05/01/14   [provider]  acetaminophen (TYLENOL) 500 MG tablet Take 500 mg by mouth 2 (two) times daily. Reported on 06/19/2015    [provider]  amLODipine (NORVASC) 10 MG tablet Take 10 mg by mouth every evening.  05/26/16   [provider]  B-D UF III MINI PEN NEEDLES 31G X 5 MM MISC  06/20/14   [provider]  calamine lotion Apply 1 application topically 2 (two) times daily as needed for itching (to shoulders/ back).    [provider]  calcitRIOL (ROCALTROL) 0.25 MCG capsule Take 0.25 mcg by mouth daily.  12/26/11   [provider]  cholecalciferol (VITAMIN D) 1000 UNITS tablet Take 2,000 Units by mouth daily.     [provider]  cloNIDine (CATAPRES - DOSED IN MG/24 HR) 0.2 mg/24hr patch  08/08/17   [provider]  cloNIDine (CATAPRES) 0.1 MG tablet Take 0.1 mg by mouth 2 (two) times daily.  05/03/16   [provider]  diclofenac sodium (VOLTAREN) 1 % GEL Apply 2 g topically 4 (four) times daily. 03/08/17   Leandrew Koyanagi, MD  docusate sodium (COLACE) 100 MG capsule Take 100 mg by mouth daily.    [provider]  donepezil (ARICEPT) 10 MG tablet Take 1 tablet (10 mg total) by mouth at bedtime. 06/22/16   Dennie Bible, NP  fluticasone [facility]) 50 MCG/ACT nasal spray Place 1 spray into both nostrils 2  (two) times daily. Patient taking differently: Place 1 spray into both nostrils 2 (two) times daily as needed for allergies.  10/04/16   Doreatha Lew, MD  hydrALAZINE (APRESOLINE) 25 MG tablet Take 75 mg by mouth 3 (three) times daily.  10/22/14   [provider]  hydrOXYzine (ATARAX/VISTARIL) 10 MG tablet TAKE 1 TABLET BY MOUTH TWICE DAILY AS NEEDED FOR ITCHING 12/10/14   [provider]  insulin aspart (NOVOLOG FLEXPEN) 100 UNIT/ML FlexPen Inject 0-5 Units into the skin 3 (three) times daily as needed for high blood sugar (sliding scale).    [provider]  Insulin Glargine (LANTUS SOLOSTAR) 100 UNIT/ML Solostar Pen Inject 25 Units into the skin every morning.     [provider]  latanoprost (XALATAN) 0.005 % ophthalmic solution Place 1 drop into both eyes at bedtime. 06/11/17   [provider]  loratadine (CLARITIN) 10 MG tablet Take 10 mg by mouth daily with lunch.    [provider]  LUMIGAN 0.01 % SOLN Place 1 drop into both eyes at bedtime. 08/03/16  [provider]  metoprolol succinate (TOPROL-XL) 50 MG 24 hr tablet Take 50 mg by mouth daily. 06/30/17   [provider]  OVER THE COUNTER MEDICATION Apply 1 application topically 4 (four) times daily as needed (clotrimazole= to the buttocks for rash as needed).     [provider]  pantoprazole (PROTONIX) 40 MG tablet Take 40 mg by mouth daily.  05/04/15   [provider]  phenytoin (DILANTIN) 100 MG ER capsule Take 200 mg by mouth daily.    [provider]  Polyethyl Glycol-Propyl Glycol (SYSTANE ULTRA) 0.4-0.3 % SOLN Place 1 drop into both eyes 3 (three) times daily as needed (dry eyes).    [provider]  polyethylene glycol powder (GLYCOLAX/MIRALAX) powder Take 17 g by mouth daily as needed for mild constipation.  05/18/14   [provider]  Probiotic Product (PROBIOTIC-10 PO) Take 1 capsule by mouth daily.    [provider]  senna (SENOKOT) 8.6 MG TABS Take 1 tablet by mouth daily.    [provider]  Triamcinolone Acetonide (NASACORT AQ NA) Place 1 spray into both nostrils daily as needed (for allergies).     [provider]    Family History Family History  Problem Relation Age of Onset  . Cancer Father        had uncertain type of cancer  . Cancer Brother        Prostate Cancer  . Cancer Brother        Prostate Cancer  . Diabetes Other     Social History Social History   Tobacco Use  . Smoking status: Never Smoker  . Smokeless tobacco: Never Used  Substance Use Topics  . Alcohol use: No  . Drug use: No     Allergies   Percocet [oxycodone-acetaminophen]   Review of Systems Unable to reliably obtain due to pt's baseline mental status.   Physical Exam Updated Vital Signs BP (!) 196/94   Pulse 71   Temp 98 F (36.7 C) (Rectal)   Resp 18   SpO2 98%   General: Elderly male resting on ED stretcher comfortably , no acute distress Head: Normocephalic, atraumatic Eyes: EOMI, PERRL ENT: Moist mucus membranes, no pharyngeal exudate  CV: RRR, s1, s2  Resp: Fairly clear breath sounds, no wheezing or focal crackles, mildly increased work of breathing, no distress  Abd: Soft, +BS, no tenderness to palpation  Extr: Bilateral symmetric LE edema with compression stockings in place, 5/5 upper extremity strength, 4/5 bilateral LE strength  Neuro: Alert and oriented to self and place, not time, no focal weakness  Skin: Warm, dry      ED Treatments / Results  Labs (all labs ordered are listed, but only abnormal results are displayed) Labs Reviewed  CBC WITH DIFFERENTIAL/PLATELET - Abnormal; Notable for the following components:      Result Value   RBC 3.00 (*)    Hemoglobin 9.9 (*)    HCT 30.3 (*)    MCV 101.0 (*)    All other components within normal limits  COMPREHENSIVE METABOLIC PANEL - Abnormal; Notable for the following components:   CO2 21 (*)     Glucose, Bld 144 (*)    BUN 29 (*)    Creatinine, Ser 2.24 (*)    Calcium 8.7 (*)    Albumin 2.7 (*)    GFR calc non Af Amer 24 (*)    GFR calc Af Amer 28 (*)    All other components within  normal limits  CBG MONITORING, ED - Abnormal; Notable for the following components:   Glucose-Capillary 159 (*)    All other components within normal limits  I-STAT VENOUS BLOOD GAS, ED - Abnormal; Notable for the following components:   pCO2, Ven 34.0 (*)    Acid-base deficit 3.0 (*)    All other components within normal limits  URINALYSIS, ROUTINE W REFLEX MICROSCOPIC  TROPONIN I  TROPONIN I  TROPONIN I  PREALBUMIN  LACTIC ACID, PLASMA  BLOOD GAS, VENOUS  VITAMIN B12  FOLATE  IRON AND TIBC  FERRITIN  RETICULOCYTES  PHENYTOIN LEVEL, TOTAL  CBG MONITORING, ED    EKG EKG Interpretation  Date/Time:  Friday Aug 26 2017 14:18:56 EDT Ventricular Rate:  76 PR Interval:    QRS Duration: 96 QT Interval:  437 QTC Calculation: 492 R Axis:   52 Text Interpretation:  Sinus rhythm Probable left atrial enlargement Borderline ST depression, lateral leads Borderline prolonged QT interval No STEMI.  Confirmed by Nanda Quinton [phone]) on 08/26/2017 2:30:56 PM   Radiology Dg Chest 2 View  Result Date: 08/26/2017 CLINICAL DATA:  84 year old male with cough and altered mental status. EXAM: CHEST - 2 VIEW COMPARISON:  Chest radiographs 08/02/2017 and earlier. FINDINGS: Diffuse bilateral pulmonary interstitial opacity with confluent perihilar and bilateral lung base opacity. Evidence of small layering pleural effusions on the lateral view. Visible mediastinal contours are stable. No pneumothorax. Visualized tracheal air column is within normal limits. No acute osseous abnormality identified. Calcified aortic atherosclerosis. Negative visible bowel gas pattern. IMPRESSION: 1. Acute bilateral pulmonary opacity most suggestive of acute pulmonary edema with small pleural effusions and bibasilar atelectasis.  Bilateral pneumonia is less likely. 2.  Aortic Atherosclerosis (ICD10-I70.0). Electronically Signed   By: Genevie Ann M.D.   On: 08/26/2017 16:15   Ct Head Wo Contrast  Result Date: 08/26/2017 CLINICAL DATA:  Acute onset of staring and unresponsiveness. History of dementia. EXAM: CT HEAD WITHOUT CONTRAST TECHNIQUE: Contiguous axial images were obtained from the base of the skull through the vertex without intravenous contrast. COMPARISON:  08/02/2017. FINDINGS: Brain: No evidence for acute infarction, hemorrhage, mass lesion, or extra-axial fluid. Generalized atrophy. Hydrocephalus ex vacuo. Chronic microvascular ischemic change likely accounts for hypoattenuation of the white matter. Vascular: Calcification of the cavernous internal carotid arteries consistent with cerebrovascular atherosclerotic disease. No signs of intracranial large vessel occlusion. Skull: The calvarium is intact. Incidental dural calcification. Hypertrophic pannus surrounds the odontoid. Sinuses/Orbits: No layering sinus fluid.  Negative orbits. Other: None. Similar appearance to priors. IMPRESSION: Advanced atrophy.  No acute intracranial findings. Electronically Signed   By: Staci Righter M.D.   On: 08/26/2017 14:55    Procedures Procedures (including critical care time)  Medications Ordered in ED Medications  cloNIDine (CATAPRES) tablet 0.1 mg (has no administration in time range)  furosemide (LASIX) injection 40 mg (40 mg Intravenous Given 08/26/17 1754)     Initial Impression / Assessment and Plan / ED Course  I have reviewed the triage vital signs and the nursing notes.  Pertinent labs & imaging results that were available during my care of the patient were reviewed by me and considered in my medical decision making (see chart for details).  84 yo M with a past medical history of mild dementia and remote seizures presenting following a period of decreased responsiveness and staring.  He was recently evaluated by  neurology for  similar episodes on 5/6.  This is felt to be due to excessive daytime sleepiness, though EEG was  ordered and phenytoin dose increased.  Sister states this episode was different from prior and that he was staring.  Also has a recent admission from 4/16-4/19 for altered mental status due to E. coli UTI.  We will obtain lab work, U/A, CT head, chest x-ray to evaluate for underlying metabolic, infectious, neurologic causes.  CT head showed atrophy without acute process, labs are similar to prior with Cr and Hgb similar to baseline and no other acute changes. CXR does suggest small bilateral pleural effusions and component of edema. Last echo in 09/2016 with normal EF, grade 1 DD. This likely explains his increased work of breathing noted prior to arrival and here, no hypoxia documented. Will start diuresis and would like to admit for observation given his CKD rather than outpatient diuresis. Discussed case with triad hospitalist for admission.   Pt BP trending up throughout stay, is on clonidine at home, may be a component of rebound hypertension if he is past due for his evening dose. Will put in for clonidine dose and defer further management to admitting team.   Final Clinical Impressions(s) / ED Diagnoses   Final diagnoses:  Acute pulmonary edema ([place])  Altered mental status, unspecified altered mental status type    ED Discharge Orders    None       Tawny Asal, MD 08/26/17 2232    Margette Fast, MD 08/26/17 [phone]

## 2017-08-26 NOTE — ED Notes (Signed)
Hospitalist notified of critical lab; cardio consult placed per MD

## 2017-08-26 NOTE — H&P (Signed)
Eric [name] EXH:[id number] DOB: [birth year] DOA: 08/26/2017     PCP: [name], MD   Outpatient Specialists:     NEurology     Dr. Delano Metz Patient arrived to ER on 08/26/17 at 1348  Patient coming from:   home Lives  With family   Chief Complaint:  Chief Complaint  Patient presents with  . Altered Mental Status    HPI: Eric [name] is a 84 y.o. male with medical history significant of dementia, hypertension, hyperlipidemia, CKD 3, diabetes, seizures, diastolic CHF, history of TIA in 2016, GERD, BPH, CKD     Presented with stareing episode at 1 PM witnessed by sister. This lasted for 10 min, seemed to be short of breath.  Recently discharged from SNF back to home with home health and CNA.  Of note patient was told by CNA yesterday patient had an episode of shortness of breath and cough with some mucus production.  Patient himself unable to provide detailed history Have had similar episode in the past neurology.  He has been seen in the office few days ago by Dr. Leonie Man who has increased his dilantin from 100 to 200 mg a day with a plan to recheck levels in 2 weeks and have an EEG done. Although family states in the past episodes of more but does involve rather than staring into space patient has history of seizures. History of frequent falls walks with a walker He recently has been admitted for UTI showed E. coli sensitive to third-generation cephalosporin he was initially treated with ceftriaxone and transition to Ceftin dear for 10 days Regarding pertinent Chronic problems: History of diabetes mellitus insulin-dependent last hemoglobin A1c was 6.5 in 2016 on Lantus and sliding scale History of hypertension poorly controlled on amlodipine hydralazine metoprolol and clonidine as well as lisinopril Known history of chronic kidney disease baseline creatinine around one-point 1.8-2  While in ER: No hypoxia  Following Medications were ordered in ER: Medications    furosemide (LASIX) injection 40 mg (40 mg Intravenous Given 08/26/17 1754)    Significant initial  Findings: Abnormal Labs Reviewed  CBC WITH DIFFERENTIAL/PLATELET - Abnormal; Notable for the following components:      Result Value   RBC 3.00 (*)    Hemoglobin 9.9 (*)    HCT 30.3 (*)    MCV 101.0 (*)    All other components within normal limits  COMPREHENSIVE METABOLIC PANEL - Abnormal; Notable for the following components:   CO2 21 (*)    Glucose, Bld 144 (*)    BUN 29 (*)    Creatinine, Ser 2.24 (*)    Calcium 8.7 (*)    Albumin 2.7 (*)    GFR calc non Af Amer 24 (*)    GFR calc Af Amer 28 (*)    All other components within normal limits  CBG MONITORING, ED - Abnormal; Notable for the following components:   Glucose-Capillary 159 (*)    All other components within normal limits     Na 137 K  3.7  Cr  Up from baseline see below Lab Results  Component Value Date   CREATININE 2.24 (H) 08/26/2017   CREATININE 2.18 (H) 08/05/2017   CREATININE 2.32 (H) 08/04/2017      WBC  5.1  HG/HCT  Stable     Component Value Date/Time   HGB 9.9 (L) 08/26/2017 1453   HCT 30.3 (L) 08/26/2017 1453      Troponin (Point of Care  Test) No results for input(s): TROPIPOC in the last 72 hours.   BNP (last 3 results) Recent Labs    06/29/17 1329  BNP 256.0*    ProBNP (last 3 results) No results for input(s): PROBNP in the last 8760 hours.  Lactic Acid, Venous    Component Value Date/Time   LATICACIDVEN 1.0 08/04/2017 0505      UA   ordered   CT HEAD   NON acute  CXR -bilateral pulmonary opacities likely edema less likely to be pneumonia     ECG:  Personally reviewed by me showing: HR : 76 Rhythm:   NSR    no evidence of ischemic changes [id number]       ED Triage Vitals  Enc Vitals Group     BP 08/26/17 1515 (!) 176/87     Pulse Rate 08/26/17 1515 69     Resp 08/26/17 1515 15     Temp 08/26/17 1420 98 F (36.7 C)     Temp Source 08/26/17 1420 Rectal      SpO2 08/26/17 1515 100 %     Weight --      Height --      Head Circumference --      Peak Flow --      Pain Score 08/26/17 1535 0     Pain Loc --      Pain Edu? --      Excl. in [place]? --   TMAX(24)@       Latest  Blood pressure (!) 190/90, pulse 71, temperature 98 F (36.7 C), temperature source Rectal, resp. rate 17, SpO2 98 %.    THis Provider Called:  NEurology   Dr. Aviva Kluver They Recommend check Dilantin  medical work-up, EEG in a.m. Will see in AM or in  in ER  Hospitalist was called for admission for altered mental status and possible fluid overload   Review of Systems:    Pertinent positives include: Staring episode shortness of breath at rest  dyspnea on exertion  excess mucus productive cough Constitutional:  No weight loss, night sweats, Fevers, chills, fatigue, weight loss  HEENT:  No headaches, Difficulty swallowing,Tooth/dental problems,Sore throat,  No sneezing, itching, ear ache, nasal congestion, post nasal drip,  Cardio-vascular:  No chest pain, Orthopnea, PND, anasarca, dizziness, palpitations.no Bilateral lower extremity swelling  GI:  No heartburn, indigestion, abdominal pain, nausea, vomiting, diarrhea, change in bowel habits, loss of appetite, melena, blood in stool, hematemesis Resp:    No non-productive cough, No coughing up of blood. No change in color of mucus. No wheezing. Skin:  no rash or lesions. No jaundice GU:  no dysuria, change in color of urine, no urgency or frequency. No straining to urinate.  No flank pain.  Musculoskeletal:  No joint pain or no joint swelling. No decreased range of motion. No back pain.  Psych:  No change in mood or affect. No depression or anxiety. No memory loss.  Neuro: no localizing neurological complaints, no tingling, no weakness, no double vision, no gait abnormality, no slurred speech, no confusion  As per HPI otherwise 10 point review of systems negative.   Past Medical History:   Past Medical History:    Diagnosis Date  . Adult failure to thrive   . Altered mental status   . ANEMIA-NOS 02/17/2007  . Anxiety   . BACK PAIN, LUMBAR   . BENIGN PROSTATIC HYPERTROPHY   . Bilateral leg edema   . CAP (community acquired pneumonia)   . CKD (  chronic kidney disease)   . DEPRESSION 12/25/2008  . Diabetes mellitus   . DIABETES MELLITUS, TYPE I, CONTROLLED, WITH RETINOPATHY   . Encephalopathy, metabolic   . Gastroparesis   . GERD 02/17/2007  . Hyperlipidemia   . HYPERTENSION 02/17/2007  . Hyponatremia   . LIVER DISORDER   . OSTEOARTHRITIS 03/04/2010  . Pneumonia   . PSA, INCREASED   . Stroke ([facility])   . UTI (urinary tract infection) 07/2017  . VITAMIN B12 DEFICIENCY       Past Surgical History:  Procedure Laterality Date  . LEFT HEART CATHETERIZATION WITH CORONARY ANGIOGRAM N/A 04/23/2011   Procedure: LEFT HEART CATHETERIZATION WITH CORONARY ANGIOGRAM;  Surgeon: Peter M Martinique, MD;  Location: [facility] CATH LAB;  Service: Cardiovascular;  Laterality: N/A;  . TRANSURETHRAL RESECTION OF PROSTATE      Social History:  Ambulatory      reports that he has never smoked. He has never used smokeless tobacco. He reports that he does not drink alcohol or use drugs.     Family History:   Family History  Problem Relation Age of Onset  . Cancer Father        had uncertain type of cancer  . Cancer Brother        Prostate Cancer  . Cancer Brother        Prostate Cancer  . Diabetes Other     Allergies: Allergies  Allergen Reactions  . Percocet [Oxycodone-Acetaminophen] Rash     Prior to Admission medications   Medication Sig Start Date End Date Taking? Authorizing Provider  ACCU-CHEK AVIVA PLUS test strip  05/01/14   [provider]  acetaminophen (TYLENOL) 500 MG tablet Take 500 mg by mouth 2 (two) times daily. Reported on 06/19/2015    [provider]  amLODipine (NORVASC) 10 MG tablet Take 10 mg by mouth every evening.  05/26/16   [provider]  B-D UF III MINI  PEN NEEDLES 31G X 5 MM MISC  06/20/14   [provider]  calamine lotion Apply 1 application topically 2 (two) times daily as needed for itching (to shoulders/ back).    [provider]  calcitRIOL (ROCALTROL) 0.25 MCG capsule Take 0.25 mcg by mouth daily.  12/26/11   [provider]  cholecalciferol (VITAMIN D) 1000 UNITS tablet Take 2,000 Units by mouth daily.     [provider]  cloNIDine (CATAPRES - DOSED IN MG/24 HR) 0.2 mg/24hr patch  08/08/17   [provider]  cloNIDine (CATAPRES) 0.1 MG tablet Take 0.1 mg by mouth 2 (two) times daily.  05/03/16   [provider]  diclofenac sodium (VOLTAREN) 1 % GEL Apply 2 g topically 4 (four) times daily. 03/08/17   Leandrew Koyanagi, MD  docusate sodium (COLACE) 100 MG capsule Take 100 mg by mouth daily.    [provider]  donepezil (ARICEPT) 10 MG tablet Take 1 tablet (10 mg total) by mouth at bedtime. 06/22/16   Dennie Bible, NP  fluticasone [facility]) 50 MCG/ACT nasal spray Place 1 spray into both nostrils 2 (two) times daily. Patient taking differently: Place 1 spray into both nostrils 2 (two) times daily as needed for allergies.  10/04/16   Doreatha Lew, MD  hydrALAZINE (APRESOLINE) 25 MG tablet Take 75 mg by mouth 3 (three) times daily.  10/22/14   [provider]  hydrOXYzine (ATARAX/VISTARIL) 10 MG tablet TAKE 1 TABLET BY MOUTH TWICE DAILY AS NEEDED FOR ITCHING 12/10/14   [provider]  insulin aspart (NOVOLOG FLEXPEN) 100 UNIT/ML FlexPen Inject 0-5 Units into the skin 3 (three) times daily as needed for high blood sugar (sliding scale).    [provider]  Insulin Glargine (LANTUS SOLOSTAR) 100 UNIT/ML Solostar Pen Inject 25 Units into the skin every morning.     [provider]  latanoprost (XALATAN) 0.005 % ophthalmic solution Place 1 drop into both eyes at bedtime. 06/11/17   [provider]  loratadine (CLARITIN) 10 MG tablet Take 10  mg by mouth daily with lunch.    [provider]  LUMIGAN 0.01 % SOLN Place 1 drop into both eyes at bedtime. 08/03/16   [provider]  metoprolol succinate (TOPROL-XL) 50 MG 24 hr tablet Take 50 mg by mouth daily. 06/30/17   [provider]  OVER THE COUNTER MEDICATION Apply 1 application topically 4 (four) times daily as needed (clotrimazole= to the buttocks for rash as needed).     [provider]  pantoprazole (PROTONIX) 40 MG tablet Take 40 mg by mouth daily.  05/04/15   [provider]  phenytoin (DILANTIN) 100 MG ER capsule Take 200 mg by mouth daily.    [provider]  Polyethyl Glycol-Propyl Glycol (SYSTANE ULTRA) 0.4-0.3 % SOLN Place 1 drop into both eyes 3 (three) times daily as needed (dry eyes).    [provider]  polyethylene glycol powder (GLYCOLAX/MIRALAX) powder Take 17 g by mouth daily as needed for mild constipation.  05/18/14   [provider]  Probiotic Product (PROBIOTIC-10 PO) Take 1 capsule by mouth daily.    [provider]  senna (SENOKOT) 8.6 MG TABS Take 1 tablet by mouth daily.    [provider]  Triamcinolone Acetonide (NASACORT AQ NA) Place 1 spray into both nostrils daily as needed (for allergies).     [provider]   Physical Exam: Blood pressure (!) 190/90, pulse 71, temperature 98 F (36.7 C), temperature source Rectal, resp. rate 17, SpO2 98 %. 1. General:  in No Acute distress  Chronically ill  -appearing 2. Psychological: Alert and   Oriented 3. Head/ENT:   Moist  Mucous Membranes                          Head Non traumatic, neck supple                        Poor Dentition 4. SKIN:   decreased Skin turgor,  Skin clean Dry and intact no rash 5. Heart: Regular rate and rhythm no  Murmur, no Rub or gallop 6. Lungs:  no wheezes or crackles   7. Abdomen: Soft,  non-tender, Non distended bowel sounds present 8. Lower extremities: no clubbing, cyanosis, 2+  edema 9. Neurologically Grossly intact, moving all 4 extremities equally 10. MSK: Normal range of motion   LABS:     Recent Labs  Lab 08/26/17 1453  WBC 5.1  NEUTROABS 3.4  HGB 9.9*  HCT 30.3*  MCV 101.0*  PLT 841   Basic Metabolic Panel: Recent Labs  Lab 08/26/17 1453  NA 137  K 3.7  CL 107  CO2 21*  GLUCOSE 144*  BUN 29*  CREATININE 2.24*  CALCIUM 8.7*      Recent Labs  Lab 08/26/17 1453  AST 25  ALT 26  ALKPHOS 123  BILITOT 0.6  PROT 6.6  ALBUMIN 2.7*   No results for input(s): LIPASE, AMYLASE in  the last 168 hours. No results for input(s): AMMONIA in the last 168 hours.    HbA1C: No results for input(s): HGBA1C in the last 72 hours. CBG: Recent Labs  Lab 08/26/17 1419 08/26/17 1826  GLUCAP 159* 71      Urine analysis:    Component Value Date/Time   COLORURINE YELLOW 08/02/2017 2046   APPEARANCEUR CLOUDY (A) 08/02/2017 2046   LABSPEC >1.030 (H) 08/02/2017 2046   PHURINE 5.5 08/02/2017 2046   GLUCOSEU NEGATIVE 08/02/2017 2046   GLUCOSEU 500 03/30/2011 1055   HGBUR SMALL (A) 08/02/2017 2046   HGBUR moderate 10/03/2008 1349   BILIRUBINUR NEGATIVE 08/02/2017 2046   KETONESUR NEGATIVE 08/02/2017 2046   PROTEINUR >300 (A) 08/02/2017 2046   UROBILINOGEN 0.2 11/09/2014 1717   NITRITE POSITIVE (A) 08/02/2017 2046   LEUKOCYTESUR SMALL (A) 08/02/2017 2046      Cultures:    Component Value Date/Time   SDES URINE, CATHETERIZED 08/02/2017 2046   SPECREQUEST  08/02/2017 2046    NONE Performed at [facility] [address], [place], [place] [address]    CULT >=100,000 COLONIES/mL ESCHERICHIA COLI (A) 08/02/2017 2046   REPTSTATUS 08/05/2017 FINAL 08/02/2017 2046     Radiological Exams on Admission: Dg Chest 2 View  Result Date: 08/26/2017 CLINICAL DATA:  84 year old male with cough and altered mental status. EXAM: CHEST - 2 VIEW COMPARISON:  Chest radiographs 08/02/2017 and earlier. FINDINGS: Diffuse bilateral pulmonary interstitial  opacity with confluent perihilar and bilateral lung base opacity. Evidence of small layering pleural effusions on the lateral view. Visible mediastinal contours are stable. No pneumothorax. Visualized tracheal air column is within normal limits. No acute osseous abnormality identified. Calcified aortic atherosclerosis. Negative visible bowel gas pattern. IMPRESSION: 1. Acute bilateral pulmonary opacity most suggestive of acute pulmonary edema with small pleural effusions and bibasilar atelectasis. Bilateral pneumonia is less likely. 2.  Aortic Atherosclerosis (ICD10-I70.0). Electronically Signed   By: Genevie Ann M.D.   On: 08/26/2017 16:15   Ct Head Wo Contrast  Result Date: 08/26/2017 CLINICAL DATA:  Acute onset of staring and unresponsiveness. History of dementia. EXAM: CT HEAD WITHOUT CONTRAST TECHNIQUE: Contiguous axial images were obtained from the base of the skull through the vertex without intravenous contrast. COMPARISON:  08/02/2017. FINDINGS: Brain: No evidence for acute infarction, hemorrhage, mass lesion, or extra-axial fluid. Generalized atrophy. Hydrocephalus ex vacuo. Chronic microvascular ischemic change likely accounts for hypoattenuation of the white matter. Vascular: Calcification of the cavernous internal carotid arteries consistent with cerebrovascular atherosclerotic disease. No signs of intracranial large vessel occlusion. Skull: The calvarium is intact. Incidental dural calcification. Hypertrophic pannus surrounds the odontoid. Sinuses/Orbits: No layering sinus fluid.  Negative orbits. Other: None. Similar appearance to priors. IMPRESSION: Advanced atrophy.  No acute intracranial findings. Electronically Signed   By: Staci Righter M.D.   On: 08/26/2017 14:55    Chart has been reviewed    Assessment/Plan   84 y.o. male with medical history significant of dementia, hypertension, hyperlipidemia, CKD 3, diabetes, seizures, diastolic CHF, history of TIA in 2016, GERD, BPH, CKD Admitted  for staring episode and also evidence of pulmonary infiltrates likely CHF with elevated troponin  Present on Admission: . Acute diastolic CHF (congestive heart failure) ([facility]) -  - admit on telemetry,  cycle cardiac enzymes, Troponin 0.10   obtain serial ECG  to evaluate for ischemia as a cause of heart failure  monitor daily weight: There were no vitals filed for this visit. Last BNP BNP (last 3 results) Recent Labs  06/29/17 1329  BNP 256.0*       diurese with IV lasix and monitor orthostatics and creatinine to avoid over diuresis.  Order echogram to evaluate EF and valves  ACE/ARBi ordered Contraindicated due to renal insufficiency   cardiology consulted   . Acute metabolic encephalopathy episode of altered mental status unclear etiology patient does have history of seizure discussed with neurology order EEG in a.m. other explanation could be secondary to transient hypoglycemia or hypoxia . ANEMIA-NOS stable order an anemia panel . CKD (chronic kidney disease) stage 3, GFR 30-59 ml/min (HCC) slightly worsening from baseline but will avoid nephrotoxic medications and monitor kidney function with diuresis . Essential hypertension -  restart home medications . Failure to thrive in adult -  will need PT OT evaluation and nutritional consult prior to discharge . Hyperlipidemia stable continue home medications . MGUS (monoclonal gammopathy of unknown significance) remote will need to follow-up with hematology as outpatient . Hypertensive encephalopathy initially elevated blood pressures in the 190s could be possibly contributing to altered mental status currently improving after Lasix  . Staring episodes unsure if seizure episode appreciate neurology input order EEG  . Elevated troponin most likely secondary to demand ischemia in the setting of CHF but will monitor on telemetry continue to cycle cardiac enzymes appreciate cardiology consult  Dm 2 -even hypoglycemia will hold off on  Lantus for tonight order sliding scale sensitive and monitor blood sugars Borderline hypoglycemia we will repeat serial CBG and monitor treat as needed Other plan as per orders.  DVT prophylaxis:    Lovenox     Code Status:  FULL CODE  as per patient would benefit from further discussion with family and patient in a.m. I had personally discussed CODE STATUS with patient   Family Communication:   Family not  at  Bedside    Disposition Plan:   likely will need placement for rehabilitation                                                  Would benefit from PT/OT eval prior to DC   ordered                      Nutrition  Consults called: Neurology aware    Admission status:   inpatient     Level of care  tele                 Toy Baker 08/26/2017, 10:19 PM    [facility]  Pager [phone]   after 2 AM please page floor coverage PA If 7AM-7PM, please contact the day team taking care of the patient  [email or web address]  Password [email or web address]

## 2017-08-26 NOTE — ED Notes (Signed)
Attempted to call report to floor 

## 2017-08-26 NOTE — ED Triage Notes (Addendum)
Pt here from home via [facility] after acute onset "staring and unresponsiveness" to sister around noon.  When [facility] arrived pt was speaking and responding (hx of dementia).  Discharged to home after being tx for UTI.  cbg 312, ekg unremarkable,  172/89, hr 74, 98% RA, rr 18.

## 2017-08-27 ENCOUNTER — Encounter (HOSPITAL_COMMUNITY): Payer: Self-pay | Admitting: *Deleted

## 2017-08-27 ENCOUNTER — Inpatient Hospital Stay (HOSPITAL_COMMUNITY): Payer: Medicare Other

## 2017-08-27 ENCOUNTER — Other Ambulatory Visit: Payer: Self-pay

## 2017-08-27 DIAGNOSIS — I441 Atrioventricular block, second degree: Secondary | ICD-10-CM

## 2017-08-27 DIAGNOSIS — R748 Abnormal levels of other serum enzymes: Secondary | ICD-10-CM

## 2017-08-27 DIAGNOSIS — I5031 Acute diastolic (congestive) heart failure: Secondary | ICD-10-CM

## 2017-08-27 LAB — GLUCOSE, CAPILLARY
GLUCOSE-CAPILLARY: 115 mg/dL — AB (ref 65–99)
GLUCOSE-CAPILLARY: 138 mg/dL — AB (ref 65–99)
GLUCOSE-CAPILLARY: 193 mg/dL — AB (ref 65–99)
GLUCOSE-CAPILLARY: 75 mg/dL (ref 65–99)
Glucose-Capillary: 116 mg/dL — ABNORMAL HIGH (ref 65–99)
Glucose-Capillary: 215 mg/dL — ABNORMAL HIGH (ref 65–99)
Glucose-Capillary: 168 mg/dL — ABNORMAL HIGH (ref 65–99)

## 2017-08-27 LAB — COMPREHENSIVE METABOLIC PANEL
ALBUMIN: 2.6 g/dL — AB (ref 3.5–5.0)
ALK PHOS: 113 U/L (ref 38–126)
ALT: 24 U/L (ref 17–63)
AST: 23 U/L (ref 15–41)
Anion gap: 9 (ref 5–15)
BILIRUBIN TOTAL: 0.4 mg/dL (ref 0.3–1.2)
BUN: 25 mg/dL — AB (ref 6–20)
CALCIUM: 8.8 mg/dL — AB (ref 8.9–10.3)
CO2: 22 mmol/L (ref 22–32)
Chloride: 108 mmol/L (ref 101–111)
Creatinine, Ser: 2 mg/dL — ABNORMAL HIGH (ref 0.61–1.24)
GFR calc Af Amer: 32 mL/min — ABNORMAL LOW (ref >60)
GFR calc non Af Amer: 27 mL/min — ABNORMAL LOW (ref >60)
GLUCOSE: 142 mg/dL — AB (ref 65–99)
Potassium: 3.6 mmol/L (ref 3.5–5.1)
Sodium: 139 mmol/L (ref 135–145)
TOTAL PROTEIN: 6.2 g/dL — AB (ref 6.5–8.1)

## 2017-08-27 LAB — TROPONIN I
TROPONIN I: 0.09 ng/mL — AB (ref <0.03)
TROPONIN I: 0.1 ng/mL — AB (ref <0.03)

## 2017-08-27 LAB — CBC
HCT: 28.9 % — ABNORMAL LOW (ref 39.0–52.0)
Hemoglobin: 9.4 g/dL — ABNORMAL LOW (ref 13.0–17.0)
MCH: 32.8 pg (ref 26.0–34.0)
MCHC: 32.5 g/dL (ref 30.0–36.0)
MCV: 100.7 fL — ABNORMAL HIGH (ref 78.0–100.0)
Platelets: 314 10*3/uL (ref 150–400)
RBC: 2.87 MIL/uL — ABNORMAL LOW (ref 4.22–5.81)
RDW: 15.1 % (ref 11.5–15.5)
WBC: 5.6 10*3/uL (ref 4.0–10.5)

## 2017-08-27 LAB — HEMOGLOBIN A1C
HEMOGLOBIN A1C: 6 % — AB (ref 4.8–5.6)
MEAN PLASMA GLUCOSE: 125.5 mg/dL

## 2017-08-27 LAB — PHOSPHORUS: Phosphorus: 3.7 mg/dL (ref 2.5–4.6)

## 2017-08-27 LAB — TSH: TSH: 3.008 u[IU]/mL (ref 0.350–4.500)

## 2017-08-27 LAB — MRSA PCR SCREENING: MRSA by PCR: POSITIVE — AB

## 2017-08-27 LAB — MAGNESIUM: Magnesium: 1.6 mg/dL — ABNORMAL LOW (ref 1.7–2.4)

## 2017-08-27 LAB — PREALBUMIN: Prealbumin: 17 mg/dL — ABNORMAL LOW (ref 18–38)

## 2017-08-27 MED ORDER — PHENYTOIN SODIUM EXTENDED 100 MG PO CAPS
200.0000 mg | ORAL_CAPSULE | Freq: Every day | ORAL | Status: DC
  Administered 2017-08-27 – 2017-08-29 (×3): 200 mg via ORAL
  Filled 2017-08-27 (×3): qty 2

## 2017-08-27 MED ORDER — SODIUM CHLORIDE 0.9 % IV SOLN
500.0000 mg | Freq: Once | INTRAVENOUS | Status: AC
  Administered 2017-08-27: 500 mg via INTRAVENOUS
  Filled 2017-08-27: qty 10

## 2017-08-27 MED ORDER — POLYETHYLENE GLYCOL 3350 17 G PO PACK: 17.0000 g | PACK | Freq: Every day | ORAL | Status: DC | PRN

## 2017-08-27 MED ORDER — GUAIFENESIN ER 600 MG PO TB12: 1200.0000 mg | ORAL_TABLET | Freq: Two times a day (BID) | ORAL | Status: DC

## 2017-08-27 MED ORDER — DONEPEZIL HCL 10 MG PO TABS
10.0000 mg | ORAL_TABLET | Freq: Every day | ORAL | Status: DC
  Administered 2017-08-27 – 2017-08-28 (×3): 10 mg via ORAL
  Filled 2017-08-27 (×3): qty 1

## 2017-08-27 MED ORDER — CLONIDINE HCL 0.1 MG PO TABS
0.1000 mg | ORAL_TABLET | Freq: Two times a day (BID) | ORAL | Status: DC
  Administered 2017-08-27 – 2017-08-28 (×4): 0.1 mg via ORAL
  Filled 2017-08-27 (×5): qty 1

## 2017-08-27 MED ORDER — BENZONATATE 100 MG PO CAPS: 100.0000 mg | ORAL_CAPSULE | Freq: Three times a day (TID) | ORAL | Status: DC

## 2017-08-27 MED ORDER — ENOXAPARIN SODIUM 30 MG/0.3ML ~~LOC~~ SOLN
30.0000 mg | Freq: Every day | SUBCUTANEOUS | Status: DC
  Administered 2017-08-27: 30 mg via SUBCUTANEOUS
  Filled 2017-08-27: qty 0.3

## 2017-08-27 MED ORDER — ONDANSETRON HCL 4 MG PO TABS: 4.0000 mg | ORAL_TABLET | Freq: Four times a day (QID) | ORAL | Status: DC | PRN

## 2017-08-27 MED ORDER — AMLODIPINE BESYLATE 10 MG PO TABS
10.0000 mg | ORAL_TABLET | Freq: Every evening | ORAL | Status: DC
  Administered 2017-08-27 – 2017-08-28 (×2): 10 mg via ORAL
  Filled 2017-08-27 (×2): qty 1

## 2017-08-27 MED ORDER — SENNA 8.6 MG PO TABS
1.0000 | ORAL_TABLET | Freq: Every day | ORAL | Status: DC
  Administered 2017-08-27 – 2017-08-29 (×3): 8.6 mg via ORAL
  Filled 2017-08-27 (×3): qty 1

## 2017-08-27 MED ORDER — INSULIN ASPART 100 UNIT/ML ~~LOC~~ SOLN
0.0000 [IU] | SUBCUTANEOUS | Status: DC
  Administered 2017-08-27: 3 [IU] via SUBCUTANEOUS
  Administered 2017-08-27 (×2): 2 [IU] via SUBCUTANEOUS
  Administered 2017-08-28 (×2): 1 [IU] via SUBCUTANEOUS
  Administered 2017-08-28 (×2): 3 [IU] via SUBCUTANEOUS
  Administered 2017-08-29: 1 [IU] via SUBCUTANEOUS
  Administered 2017-08-29: 2 [IU] via SUBCUTANEOUS

## 2017-08-27 MED ORDER — MAGNESIUM SULFATE 2 GM/50ML IV SOLN
2.0000 g | Freq: Once | INTRAVENOUS | Status: AC
  Administered 2017-08-27: 2 g via INTRAVENOUS
  Filled 2017-08-27: qty 50

## 2017-08-27 MED ORDER — HYDRALAZINE HCL 50 MG PO TABS
75.0000 mg | ORAL_TABLET | Freq: Three times a day (TID) | ORAL | Status: DC
  Administered 2017-08-27 – 2017-08-29 (×9): 75 mg via ORAL
  Filled 2017-08-27 (×9): qty 2

## 2017-08-27 MED ORDER — LATANOPROST 0.005 % OP SOLN
1.0000 [drp] | Freq: Every day | OPHTHALMIC | Status: DC
  Administered 2017-08-28: 1 [drp] via OPHTHALMIC
  Filled 2017-08-27: qty 2.5

## 2017-08-27 MED ORDER — ALBUTEROL SULFATE (2.5 MG/3ML) 0.083% IN NEBU: 2.5000 mg | INHALATION_SOLUTION | RESPIRATORY_TRACT | Status: DC | PRN

## 2017-08-27 MED ORDER — BENZONATATE 100 MG PO CAPS
100.0000 mg | ORAL_CAPSULE | Freq: Three times a day (TID) | ORAL | Status: DC
  Administered 2017-08-27 – 2017-08-29 (×7): 100 mg via ORAL
  Filled 2017-08-27 (×7): qty 1

## 2017-08-27 MED ORDER — FUROSEMIDE 10 MG/ML IJ SOLN
40.0000 mg | Freq: Two times a day (BID) | INTRAMUSCULAR | Status: DC
  Administered 2017-08-27 – 2017-08-28 (×2): 40 mg via INTRAVENOUS
  Filled 2017-08-27 (×3): qty 4

## 2017-08-27 MED ORDER — ENSURE ENLIVE PO LIQD
237.0000 mL | Freq: Two times a day (BID) | ORAL | Status: DC
  Administered 2017-08-27 – 2017-08-29 (×5): 237 mL via ORAL

## 2017-08-27 MED ORDER — SODIUM CHLORIDE 0.9% FLUSH: 3.0000 mL | INTRAVENOUS | Status: DC | PRN

## 2017-08-27 MED ORDER — CALCITRIOL 0.25 MCG PO CAPS
0.2500 ug | ORAL_CAPSULE | Freq: Every day | ORAL | Status: DC
  Administered 2017-08-27 – 2017-08-29 (×3): 0.25 ug via ORAL
  Filled 2017-08-27 (×3): qty 1

## 2017-08-27 MED ORDER — GUAIFENESIN ER 600 MG PO TB12
600.0000 mg | ORAL_TABLET | Freq: Two times a day (BID) | ORAL | Status: DC
  Administered 2017-08-27: 600 mg via ORAL
  Filled 2017-08-27: qty 1

## 2017-08-27 MED ORDER — METOPROLOL SUCCINATE ER 50 MG PO TB24: 50.0000 mg | ORAL_TABLET | Freq: Every day | ORAL | Status: DC

## 2017-08-27 MED ORDER — GUAIFENESIN ER 600 MG PO TB12
1200.0000 mg | ORAL_TABLET | Freq: Two times a day (BID) | ORAL | Status: DC
  Administered 2017-08-27 – 2017-08-29 (×5): 1200 mg via ORAL
  Filled 2017-08-27 (×5): qty 2

## 2017-08-27 MED ORDER — HYDROXYZINE HCL 10 MG PO TABS
10.0000 mg | ORAL_TABLET | Freq: Three times a day (TID) | ORAL | Status: DC | PRN
  Administered 2017-08-27: 10 mg via ORAL
  Filled 2017-08-27: qty 1

## 2017-08-27 MED ORDER — LATANOPROST 0.005 % OP SOLN: 1.0000 [drp] | Freq: Every day | OPHTHALMIC | Status: DC

## 2017-08-27 MED ORDER — SODIUM CHLORIDE 0.9% FLUSH
3.0000 mL | Freq: Two times a day (BID) | INTRAVENOUS | Status: DC
  Administered 2017-08-27 – 2017-08-28 (×5): 3 mL via INTRAVENOUS

## 2017-08-27 MED ORDER — ONDANSETRON HCL 4 MG/2ML IJ SOLN: 4.0000 mg | Freq: Four times a day (QID) | INTRAMUSCULAR | Status: DC | PRN

## 2017-08-27 MED ORDER — HYDRALAZINE HCL 20 MG/ML IJ SOLN
10.0000 mg | Freq: Four times a day (QID) | INTRAMUSCULAR | Status: DC | PRN
  Administered 2017-08-27 (×2): 10 mg via INTRAVENOUS
  Filled 2017-08-27 (×2): qty 1

## 2017-08-27 MED ORDER — SODIUM CHLORIDE 0.9 % IV SOLN: 250.0000 mL | INTRAVENOUS | Status: DC | PRN

## 2017-08-27 MED ORDER — CHLORHEXIDINE GLUCONATE CLOTH 2 % EX PADS
6.0000 | MEDICATED_PAD | Freq: Every day | CUTANEOUS | Status: DC
  Administered 2017-08-27 – 2017-08-29 (×3): 6 via TOPICAL

## 2017-08-27 MED ORDER — FUROSEMIDE 10 MG/ML IJ SOLN: 80.0000 mg | Freq: Two times a day (BID) | INTRAMUSCULAR | Status: DC

## 2017-08-27 MED ORDER — LORATADINE 10 MG PO TABS
10.0000 mg | ORAL_TABLET | Freq: Every day | ORAL | Status: DC
  Administered 2017-08-27 – 2017-08-29 (×3): 10 mg via ORAL
  Filled 2017-08-27 (×3): qty 1

## 2017-08-27 MED ORDER — HEPARIN SODIUM (PORCINE) 5000 UNIT/ML IJ SOLN
5000.0000 [IU] | Freq: Three times a day (TID) | INTRAMUSCULAR | Status: DC
  Administered 2017-08-27 – 2017-08-29 (×6): 5000 [IU] via SUBCUTANEOUS
  Filled 2017-08-27 (×6): qty 1

## 2017-08-27 MED ORDER — GUAIFENESIN-DM 100-10 MG/5ML PO SYRP: 10.0000 mL | ORAL_SOLUTION | Freq: Four times a day (QID) | ORAL | Status: DC

## 2017-08-27 MED ORDER — PANTOPRAZOLE SODIUM 40 MG PO TBEC
40.0000 mg | DELAYED_RELEASE_TABLET | Freq: Every day | ORAL | Status: DC
  Administered 2017-08-27 – 2017-08-29 (×3): 40 mg via ORAL
  Filled 2017-08-27 (×3): qty 1

## 2017-08-27 MED ORDER — CLONIDINE HCL 0.2 MG/24HR TD PTWK: 0.2000 mg | MEDICATED_PATCH | TRANSDERMAL | Status: DC

## 2017-08-27 MED ORDER — MUPIROCIN 2 % EX OINT
1.0000 "application " | TOPICAL_OINTMENT | Freq: Two times a day (BID) | CUTANEOUS | Status: DC
  Administered 2017-08-27 – 2017-08-29 (×5): 1 via NASAL
  Filled 2017-08-27 (×2): qty 22

## 2017-08-27 NOTE — Progress Notes (Signed)
Initial Nutrition Assessment  DOCUMENTATION CODES:  Not applicable  INTERVENTION:  Ensure Enlive po BID, each supplement provides 350 kcal and 20 grams of protein  Appreciate nursing feeding assistance  NUTRITION DIAGNOSIS:  Increased nutrient needs related to wound healing as evidenced by estimated nutritional requirements for this condition  GOAL:  Patient will meet greater than or equal to 90% of their needs  MONITOR:  Labs, Weight trends, PO intake, Supplement acceptance, Skin  REASON FOR ASSESSMENT:  Consult Assessment of nutrition requirement/status  ASSESSMENT:  84 y/o male PMHx dementia, HTN, HLD, CKD3, DM2, seizures, TIA, Gerd, CKD. Presented after 10 minute episode of staring/sob. Had recently been d/cd from SNF back home. Admitted for evaluation of AMS.   Pt alone on RD arrival. He is oriented to self/location and able to answer basic questions.   He says he believes he has been eating well and reports a good appetite. Nurse tech verbalizes that the patient ate 100% this morning, though pt does require to be fed.   Patient says his UBW is in the 160s-170s which is consistent with chart history that shows his weight being between 167-177 lbs for >3 years.   Consulting MD's note indicates patient has FTT? Per chart, this diagnosis was placed in chart in 2013. He was, in fact, 20-30 lbs lighter at that time.   There are no indications of weight changes or poor appetite/intake recently. However, note patient does have a stage II PU to L buttocks. Will add Glucerna BID for extra pro/kcals.   Physical Exam: WDL, no discernible muscle/fat wasting  Labs: Albumin: 2.6, BG: 116-193, h/h:9.4/28.9, BUN/Creat: 25/2.0 Relevant Meds: Aricept, lasix, ppi, dilantin, senna  Recent Labs  Lab 08/26/17 1453 08/27/17 0630  NA 137 139  K 3.7 3.6  CL 107 108  CO2 21* 22  BUN 29* 25*  CREATININE 2.24* 2.00*  CALCIUM 8.7* 8.8*  MG  --  1.6*  PHOS  --  3.7  GLUCOSE 144* 142*    NUTRITION - FOCUSED PHYSICAL EXAM: WDL  Diet Order:   Diet Order           Diet heart healthy/carb modified Room service appropriate? Yes; Fluid consistency: Thin  Diet effective now         EDUCATION NEEDS:  No education needs have been identified at this time  Skin: PU stage 2 to left buttock   Last BM:  5/9  Height:  Ht Readings from Last 1 Encounters:  08/27/17 5\' 9"  (1.753 m)   Weight:  Wt Readings from Last 1 Encounters:  08/27/17 167 lb (75.8 kg)   Wt Readings from Last 10 Encounters:  08/27/17 167 lb (75.8 kg)  08/22/17 176 lb (79.8 kg)  08/02/17 170 lb (77.1 kg)  10/30/16 170 lb (77.1 kg)  10/03/16 175 lb 14.8 oz (79.8 kg)  06/22/16 185 lb (83.9 kg)  12/23/15 176 lb (79.8 kg)  08/26/15 170 lb (77.1 kg)  06/19/15 172 lb (78 kg)  12/20/14 169 lb 12.8 oz (77 kg)   Ideal Body Weight:  72.73 kg  BMI:  Body mass index is 24.66 kg/m.  Estimated Nutritional Needs:  Kcal:  1800-1950 kcals (24-26 kcal/kg bw) Protein:  90-105g pro (1.2-1.4g/kg bw) Fluid:  >1.9 L (25 ml/kg bw)  Burtis Junes RD, LDN, CNSC Clinical Nutrition Available Tues-Sat via Pager: [phone] 08/27/2017 1:29 PM

## 2017-08-27 NOTE — Progress Notes (Signed)
Pt noted to have possible rhythm change on telemetry to second degree type 1 and second degree type 2 heart block intermittently with HR to 30's non-sustained.  Pt noted to be going in and out of this rhythm prior to admission to floor upon review of telemetry, C. Bodenheimer, NP notified of rhythm.  Pt noted to have brief episode of unresponsiveness during bradycardia with staring and not answering staff.  HR increased to 60's and pt immediately became responsive to staff with increase in HR.  Orders received.  Verbal order received to hold clonidine due to intermittent bradycardia.  Will attempt to obtain EKG if bradycardia and rhythm change occur.  Pt denies chest pain, dizziness, or palpitations.

## 2017-08-27 NOTE — Consult Note (Signed)
Neurology Consultation Reason for Consult: Decreased responsivess Referring Physician: Dr Stephan Minister   History is obtained from: Chart review, patient poor historian   HPI: Eric [name] is a 84 y.o. male dementia, hypertension, hyperlipidemia, CKD 3, diabetes, seizures.   Per HPI, daughter stated around 1:00 this afternoon patient was noted to be staring off into space and was not responsive at the time.  This lasted about 10 minutes.  By time EMS arrived he was responsive, oriented to self and partially to place.  Seen by  Dr. Leonie Man who has increased his dilantin from 100 to 200 mg a day. Blood pressure on arrival 190/90. Is noted to be bradycardic with  Heart rate dropping into the 30s. Nurse notes that when this happens,level of alertness decreases.  No lip smacking movements, jerking movements of limbs, gaze deviation or head turning noted by either daughter her bedside nurse     ROS: A 14 point ROS was performed and is negative except as noted in the HPI.   Past Medical History:  Diagnosis Date  . Adult failure to thrive   . Altered mental status   . ANEMIA-NOS 02/17/2007  . Anxiety   . BACK PAIN, LUMBAR   . BENIGN PROSTATIC HYPERTROPHY   . Bilateral leg edema   . CAP (community acquired pneumonia)   . CKD (chronic kidney disease)   . DEPRESSION 12/25/2008  . Diabetes mellitus   . DIABETES MELLITUS, TYPE I, CONTROLLED, WITH RETINOPATHY   . Encephalopathy, metabolic   . Gastroparesis   . GERD 02/17/2007  . Hyperlipidemia   . HYPERTENSION 02/17/2007  . Hyponatremia   . LIVER DISORDER   . OSTEOARTHRITIS 03/04/2010  . Pneumonia   . PSA, INCREASED   . Stroke ([place])   . UTI (urinary tract infection) 07/2017  . VITAMIN B12 DEFICIENCY      Family History  Problem Relation Age of Onset  . Cancer Father        had uncertain type of cancer  . Cancer Brother        Prostate Cancer  . Cancer Brother        Prostate Cancer  . Diabetes Other      Social History:   reports that he has never smoked. He has never used smokeless tobacco. He reports that he does not drink alcohol or use drugs.   Exam: Current vital signs: BP (!) 188/94 (BP Location: Right Arm)   Pulse 74   Temp 98.1 F (36.7 C) (Oral)   Resp 18   Ht 5\' 9"  (1.753 m)   Wt 75.8 kg (167 lb)   SpO2 92%   BMI 24.66 kg/m  Vital signs in last 24 hours: Temp:  [98 F (36.7 C)-98.1 F (36.7 C)] 98.1 F (36.7 C) (05/11 0013) Pulse Rate:  [53-76] 74 (05/11 0013) Resp:  [14-22] 18 (05/11 0013) BP: (157-196)/(74-97) 188/94 (05/11 0013) SpO2:  [92 %-100 %] 92 % (05/11 0013) Weight:  [75.8 kg (167 lb)] 75.8 kg (167 lb) (05/11 0011)   Physical Exam  Constitutional: Appears well-developed and well-nourished.  Psych: Affect appropriate to situation Eyes: No scleral injection HENT: No OP obstrucion Head: Normocephalic.  Cardiovascular: Normal rate and regular rhythm.  Respiratory: Effort normal, non-labored breathing GI: Soft.  No distension. There is no tenderness.  Skin: WDI  Neuro: Mental Status: Patient is awake, alert, oriented to person and place only.  Patient is not able to give a clear and coherent history. No signs of aphasia or neglecy.  Cranial Nerves: II: Visual Fields are full. Pupils are equal, round, and reactive to light.   III,IV, VI: EOMI without ptosis or diploplia.  V: Facial sensation is symmetric to temperature VII: Facial movement is symmetric.  VIII: hearing is intact to voice X: Uvula elevates symmetrically XI: Shoulder shrug is symmetric. XII: tongue is midline without atrophy or fasciculations.  Motor: Tone is normal. Bulk is normal. 5/5 strength was present in all four extremities.  Sensory: Sensation is symmetric to light touch and temperature in the arms and legs. Deep Tendon Reflexes: 2+ and symmetric in the biceps and patellae.  Plantars: Toes are downgoing bilaterally.  Cerebellar: FNF and HKS are intact bilaterally      I have  reviewed labs in epic and the results pertinent to this consultation are:   I have reviewed the images obtained  ASSESSMENT AND PLAN  Staring spells   Likely associated with bradycardia with HR in 30s- consider cardiology cosnult Phenytoin level low, will administer miniload Routine EEG during daytime Seizure precautions Workup for metabolic conditions and infection      Karena Addison Danyah Guastella MD [facility] [phone]   If 7pm to 7am, please call on call as listed on AMION.

## 2017-08-27 NOTE — Progress Notes (Signed)
PROGRESS NOTE    Eric [name]  DUP:[id number] DOB: [birth year] DOA: 08/26/2017 PCP: [name], MD   Brief Narrative: Patient is a 84 year old male with past medical history of dementia, hypertension, hyperlipidemia, CKD stage III, diabetes, seizure, diastolic CHF, TIA who presents from home to the emergency department with complaints of a staring episode, shortness of breath and cough.  Patient was just discharged from skilled nursing facility back to home with home health.  Patient was suspected to have seizure and neurology was consulted.  He follows with neurology as an outpatient for seizure disorder.  He was recently admitted here for the management of UTI.  Chest x-ray in the emerge department showed pulmonary infiltrates likely secondary to CHF and patient also had elevated troponin.  Cardiology consulted.  Assessment & Plan:   Active Problems:   Hyperlipidemia   ANEMIA-NOS   Essential hypertension   CKD (chronic kidney disease) stage 3, GFR 30-59 ml/min (HCC)   Acute metabolic encephalopathy   MGUS (monoclonal gammopathy of unknown significance)   Failure to thrive in adult   Hypertensive encephalopathy   Seizure (HCC)   DM2 (diabetes mellitus, type 2) (HCC)   Acute diastolic CHF (congestive heart failure) (HCC)   Staring episodes   Diastolic CHF (HCC)   Elevated troponin   AV block, Mobitz 2  Altered mental status: Patient found to have an episode of staring.  Unable to provide history on presentation.  Currently his mental status baseline.  CT head did not show any acute intracranial abnormalities..  Seizure disorder:Neurology was consulted for suspicion of seizure episode.  He has history of seizure disorder.Dilantin level found to be low.  He was loaded with Dilantin.Resumed dilantin at home dose.EEG ordered.  Diastolic CHF: Continue Lasix.  Will monitor his input/output,daily weight.  Patient has lower extremity edema.  Will check BNP.  Bradycardia: Currently  normal sinus rhythm.  EKG_intermittent episodes of type II heart block.  Toprol stopped.  Elevated troponin: Most likely secondary to CHF.  Does not complain of chest pain.  Cardiology following.  Echocardiogram has been ordered.  Anemia: Currently stable.  CKD stage III: Kidney function slightly worsened from baseline on presentation but has improved today.  We will continue to monitor his kidney function.  Hypertension: Home medications restarted. Toprol stopped for bradycardia. Currently blood pressure stable.  Patient had elevated blood pressure on presentation.  Deconditioning/advanced age/failure to thrive: PT/OT evaluation done.  Recommended skilled nursing facility on discharge.  Hyperlipidemia: Continue home medications.    MGUS: Follow up with hematology as an outpatient.  Diabetes type 2: Continue sliding scale insulin.  We will continue to monitor his blood sugars.    DVT prophylaxis: Heparin [place] Code Status: Full Family Communication: None present at the bed side Disposition Plan:    Consultants: Cardiology,Neurology  Procedures:None  Antimicrobials:None  Subjective: Patient seen in the bedside this morning.  He was alert and oriented during my evaluation.  He was intermittently agitated and coughing.  Objective: Vitals:   08/27/17 1029 08/27/17 1142 08/27/17 1257 08/27/17 1340  BP: (!) 162/82 (!) 158/70 (!) 143/65   Pulse:  85  (!) 105  Resp:  15    Temp:  97.7 F (36.5 C)    TempSrc:  Oral    SpO2:  93%  94%  Weight:      Height:        Intake/Output Summary (Last 24 hours) at 08/27/2017 1425 Last data filed at 08/27/2017 1100 Gross per 24 hour  Intake  353 ml  Output 1825 ml  Net -1472 ml   Filed Weights   08/27/17 0011 08/27/17 0630  Weight: 75.8 kg (167 lb) 75.8 kg (167 lb)    Examination:  General exam: Appears calm and comfortable ,Not in distress,average built HEENT:PERRL,Oral mucosa moist, Ear/Nose normal on gross exam Respiratory  system: Bilateral decreased air entry Cardiovascular system: S1 & S2 heard, RRR. No JVD, murmurs, rubs, gallops or clicks. 1-2 + pedal edema. Gastrointestinal system: Abdomen is nondistended, soft and nontender. No organomegaly or masses felt. Normal bowel sounds heard. Central nervous system: Alert and oriented to place and person. No focal neurological deficits. Extremities: 1-2 + edema, no clubbing ,no cyanosis, distal peripheral pulses palpable. Skin: No rashes, lesions ,no icterus ,no pallor  Data Reviewed: I have personally reviewed following labs and imaging studies  CBC: Recent Labs  Lab 08/26/17 1453 08/27/17 0630  WBC 5.1 5.6  NEUTROABS 3.4  --   HGB 9.9* 9.4*  HCT 30.3* 28.9*  MCV 101.0* 100.7*  PLT 327 361   Basic Metabolic Panel: Recent Labs  Lab 08/26/17 1453 08/27/17 0630  NA 137 139  K 3.7 3.6  CL 107 108  CO2 21* 22  GLUCOSE 144* 142*  BUN 29* 25*  CREATININE 2.24* 2.00*  CALCIUM 8.7* 8.8*  MG  --  1.6*  PHOS  --  3.7   GFR: Estimated Creatinine Clearance: 24.1 mL/min (A) (by C-G formula based on SCr of 2 mg/dL (H)). Liver Function Tests: Recent Labs  Lab 08/26/17 1453 08/27/17 0630  AST 25 23  ALT 26 24  ALKPHOS 123 113  BILITOT 0.6 0.4  PROT 6.6 6.2*  ALBUMIN 2.7* 2.6*   No results for input(s): LIPASE, AMYLASE in the last 168 hours. No results for input(s): AMMONIA in the last 168 hours. Coagulation Profile: No results for input(s): INR, PROTIME in the last 168 hours. Cardiac Enzymes: Recent Labs  Lab 08/26/17 1927 08/27/17 0043 08/27/17 0630  TROPONINI 0.10* 0.10* 0.09*   BNP (last 3 results) No results for input(s): PROBNP in the last 8760 hours. HbA1C: Recent Labs    08/27/17 0043  HGBA1C 6.0*   CBG: Recent Labs  Lab 08/26/17 2024 08/27/17 0020 08/27/17 0430 08/27/17 0808 08/27/17 1235  GLUCAP 96 115* 75 116* 193*   Lipid Profile: No results for input(s): CHOL, HDL, LDLCALC, TRIG, CHOLHDL, LDLDIRECT in the last  72 hours. Thyroid Function Tests: Recent Labs    08/27/17 0043  TSH 3.008   Anemia Panel: Recent Labs    08/26/17 1927  VITAMINB12 276  FOLATE 9.2  FERRITIN 173  TIBC 192*  IRON 42*  RETICCTPCT 3.0   Sepsis Labs: Recent Labs  Lab 08/26/17 1927  LATICACIDVEN 1.5    Recent Results (from the past 240 hour(s))  MRSA PCR Screening     Status: Abnormal   Collection Time: 08/27/17 12:28 AM  Result Value Ref Range Status   MRSA by PCR POSITIVE (A) NEGATIVE Final    Comment:        The GeneXpert MRSA Assay (FDA approved for NASAL specimens only), is one component of a comprehensive MRSA colonization surveillance program. It is not intended to diagnose MRSA infection nor to guide or monitor treatment for MRSA infections. RESULT CALLED TO, READ BACK BY AND VERIFIED WITH: Ellis Parents [phone] 08/27/2017 TRosalia Hammers          Radiology Studies: Dg Chest 2 View  Result Date: 08/26/2017 CLINICAL DATA:  84 year old male with cough and altered  mental status. EXAM: CHEST - 2 VIEW COMPARISON:  Chest radiographs 08/02/2017 and earlier. FINDINGS: Diffuse bilateral pulmonary interstitial opacity with confluent perihilar and bilateral lung base opacity. Evidence of small layering pleural effusions on the lateral view. Visible mediastinal contours are stable. No pneumothorax. Visualized tracheal air column is within normal limits. No acute osseous abnormality identified. Calcified aortic atherosclerosis. Negative visible bowel gas pattern. IMPRESSION: 1. Acute bilateral pulmonary opacity most suggestive of acute pulmonary edema with small pleural effusions and bibasilar atelectasis. Bilateral pneumonia is less likely. 2.  Aortic Atherosclerosis (ICD10-I70.0). Electronically Signed   By: Genevie Ann M.D.   On: 08/26/2017 16:15   Ct Head Wo Contrast  Result Date: 08/26/2017 CLINICAL DATA:  Acute onset of staring and unresponsiveness. History of dementia. EXAM: CT HEAD WITHOUT CONTRAST  TECHNIQUE: Contiguous axial images were obtained from the base of the skull through the vertex without intravenous contrast. COMPARISON:  08/02/2017. FINDINGS: Brain: No evidence for acute infarction, hemorrhage, mass lesion, or extra-axial fluid. Generalized atrophy. Hydrocephalus ex vacuo. Chronic microvascular ischemic change likely accounts for hypoattenuation of the white matter. Vascular: Calcification of the cavernous internal carotid arteries consistent with cerebrovascular atherosclerotic disease. No signs of intracranial large vessel occlusion. Skull: The calvarium is intact. Incidental dural calcification. Hypertrophic pannus surrounds the odontoid. Sinuses/Orbits: No layering sinus fluid.  Negative orbits. Other: None. Similar appearance to priors. IMPRESSION: Advanced atrophy.  No acute intracranial findings. Electronically Signed   By: Staci Righter M.D.   On: 08/26/2017 14:55        Scheduled Meds: . amLODipine  10 mg Oral QPM  . benzonatate  100 mg Oral TID  . calcitRIOL  0.25 mcg Oral Daily  . Chlorhexidine Gluconate Cloth  6 each Topical Q0600  . cloNIDine  0.1 mg Oral BID  . donepezil  10 mg Oral QHS  . enoxaparin (LOVENOX) injection  30 mg Subcutaneous Daily  . feeding supplement (ENSURE ENLIVE)  237 mL Oral BID BM  . furosemide  40 mg Intravenous BID  . guaiFENesin  1,200 mg Oral BID  . hydrALAZINE  75 mg Oral Q8H  . insulin aspart  0-9 Units Subcutaneous Q4H  . latanoprost  1 drop Both Eyes QHS  . loratadine  10 mg Oral Q lunch  . mupirocin ointment  1 application Nasal BID  . pantoprazole  40 mg Oral Daily  . phenytoin  200 mg Oral Daily  . senna  1 tablet Oral Daily  . sodium chloride flush  3 mL Intravenous Q12H   Continuous Infusions: . sodium chloride    . magnesium sulfate 1 - 4 g bolus IVPB       LOS: 1 day    Time spent: 35 mins.More than 50% of that time was spent in counseling and/or coordination of care.      Shelly Coss, MD [facility] Pager [phone]  If 7PM-7AM, please contact night-coverage [email or web address] Password [email or web address] 08/27/2017, 2:25 PM

## 2017-08-27 NOTE — Evaluation (Signed)
Physical Therapy Evaluation Patient Details Name: Eric [name] MRN: [id number] DOB: [birth year] Today's Date: 08/27/2017   History of Present Illness  84 y.o. male with PMHx: CHF (EF 65-70%), HTN, HLD, DM, Stage 3 CKD, seizure disorder, CAD, and dementia. Admitted for staring episode with acute on chronic CHF  Clinical Impression  Pt oriented to person and place but could not adequately report home setup or PLOF. Pt objectively able to ambulate 60 ft min A with RW with frequent cueing for upright posture. Pt became fatigued after ambulation requiring a chair follow. Pt is likely at baseline level of function but could benefit from acute PT while in the hospital to reduce fall risk and improve level of mobility.   89% SpO2 on RA with gait with 99% at rest on RA HR 84-105 with activity    Follow Up Recommendations Supervision/Assistance - 24 hour;SNF    Equipment Recommendations  None recommended by PT    Recommendations for Other Services       Precautions / Restrictions Precautions Precautions: Fall      Mobility  Bed Mobility Overal bed mobility: Needs Assistance Bed Mobility: Supine to Sit     Supine to sit: Min guard     General bed mobility comments: guarding for safety with assist for lines  Transfers Overall transfer level: Needs assistance   Transfers: Sit to/from Stand Sit to Stand: Min guard         General transfer comment: guarding for lines and safety with cues for hand placement  Ambulation/Gait Ambulation/Gait assistance: Min assist;+2 safety/equipment Ambulation Distance (Feet): 60 Feet Assistive device: Rolling walker (2 wheeled) Gait Pattern/deviations: Step-through pattern;Decreased stride length;Trunk flexed   Gait velocity interpretation: <1.8 ft/sec, indicate of risk for recurrent falls General Gait Details: cues for posture and stepping into RW, pt maintains flexed posture with rW too anterior. Close chair follow as pt fatigues  quickly  Stairs            Wheelchair Mobility    Modified Rankin (Stroke Patients Only)       Balance Overall balance assessment: Needs assistance;History of Falls Sitting-balance support: Feet supported Sitting balance-Leahy Scale: Fair Sitting balance - Comments: anterior lean in sitting     Standing balance-Leahy Scale: Poor Standing balance comment: bil UE support in standing                             Pertinent Vitals/Pain Pain Assessment: No/denies pain    Home Living Family/patient expects to be discharged to:: Unsure                 Additional Comments: per chart lives with family. no family present and pt unable to state    Prior Function Level of Independence: Needs assistance   Gait / Transfers Assistance Needed: walks with walker, frequent falls     Comments: pt confused and unable to provide reliable information; no family or caregiver present during evaluation     Hand Dominance        Extremity/Trunk Assessment   Upper Extremity Assessment Upper Extremity Assessment: Generalized weakness    Lower Extremity Assessment Lower Extremity Assessment: Generalized weakness    Cervical / Trunk Assessment Cervical / Trunk Assessment: Kyphotic  Communication   Communication: No difficulties  Cognition Arousal/Alertness: Awake/alert Behavior During Therapy: WFL for tasks assessed/performed Overall Cognitive Status: No family/caregiver present to determine baseline cognitive functioning Area of Impairment: Memory;Following commands;Safety/judgement;Problem solving;Orientation  Orientation Level: Disoriented to;Time;Situation;Place   Memory: Decreased short-term memory;Decreased recall of precautions Following Commands: Follows one step commands consistently Safety/Judgement: Decreased awareness of safety;Decreased awareness of deficits            General Comments      Exercises      Assessment/Plan    PT Assessment Patient needs continued PT services  PT Problem List Decreased strength;Decreased activity tolerance;Decreased balance;Decreased mobility;Decreased coordination;Decreased cognition;Decreased knowledge of use of DME;Decreased safety awareness;Decreased knowledge of precautions       PT Treatment Interventions DME instruction;Gait training;Stair training;Functional mobility training;Therapeutic activities;Therapeutic exercise;Balance training;Neuromuscular re-education;Cognitive remediation;Patient/family education    PT Goals (Current goals can be found in the Care Plan section)  Acute Rehab PT Goals PT Goal Formulation: Patient unable to participate in goal setting Time For Goal Achievement: 09/10/17 Potential to Achieve Goals: Fair    Frequency Min 2X/week   Barriers to discharge Decreased caregiver support unclear of family support    Co-evaluation               AM-PAC PT "6 Clicks" Daily Activity  Outcome Measure Difficulty turning over in bed (including adjusting bedclothes, sheets and blankets)?: A Little Difficulty moving from lying on back to sitting on the side of the bed? : A Little Difficulty sitting down on and standing up from a chair with arms (e.g., wheelchair, bedside commode, etc,.)?: A Little Help needed moving to and from a bed to chair (including a wheelchair)?: A Little Help needed walking in hospital room?: A Little Help needed climbing 3-5 steps with a railing? : A Lot 6 Click Score: 17    End of Session Equipment Utilized During Treatment: Gait belt Activity Tolerance: Patient tolerated treatment well Patient left: in chair;with call bell/phone within reach;with chair alarm set Nurse Communication: Mobility status PT Visit Diagnosis: Other abnormalities of gait and mobility (R26.89);Muscle weakness (generalized) (M62.81);Unsteadiness on feet (R26.81)    Time: 0177-9390 PT Time Calculation (min) (ACUTE ONLY):  21 min   Charges:   PT Evaluation $PT Eval Moderate Complexity: 1 Mod     PT G Codes:        Elwyn Reach, PT [phone]   [place] 08/27/2017, 2:05 PM

## 2017-08-27 NOTE — Progress Notes (Signed)
EEG shows background slowing without epileptiform discharges.   No new changes to plan. Continue Dilantin. Outpatient neurology follow up.   Neurohospitalist team will sign off. Please call if there are additional questions.   Electronically signed: Dr. Kerney Elbe

## 2017-08-27 NOTE — Progress Notes (Signed)
Able to obtain EKG with pt HR dropping to 39 with rhythm change.  Placed in chart.

## 2017-08-27 NOTE — Clinical Social Work Note (Signed)
Clinical Social Work Assessment  Patient Details  Name: Eric Eric [name] MRN: [id number] Date of Birth: [birth year]  Date of referral:  08/27/17               Reason for consult:  Facility Placement                Permission sought to share information with:  Family Supports Permission granted to share information::     Name::     Public librarian::     Relationship::  Eric [name]  Sport and exercise psychologist Information:     Housing/Transportation Living arrangements for the past 2 months:  Eric Eric [name] of Information:  Siblings Patient Interpreter Needed:  None Criminal Activity/Legal Involvement Pertinent to Current Situation/Hospitalization:  No - Comment as needed Significant Relationships:  Siblings Lives with:  Self Do you feel safe going back to the place where you live?    Need for family participation in patient care:  Yes (Comment)  Care giving concerns:  Pt is only alert to self. CSW spoke with Eric Eric [name] via telephone.   Social Worker assessment / plan:  CSW spoke with Eric Eric [name]. Pt was previsously at [name] and was d/c home last Thursday. Pt was set up with [facility] with the start day on Friday however, Eric Eric [name] had to call ambulance on Friday and pt was admitted. Eric Eric [name] states she wants the pt to return home with [name]. Pt Eric [name] states pt lives alone however, has someone with him almost 24/7. CSW will notified RNCM.   Employment status:  Retired Nurse, adult PT Recommendations:  [place] / Referral to community resources:  Eric Eric [name]  Patient/Family's Response to care:  Eric Eric [name] denies any concerns at this time regarding pt care.  Patient/Family's Understanding of and Emotional Response to Diagnosis, Current Treatment, and Prognosis:  Eric Eric [name] understanding of Eric physical limitations. Eric Eric [name] states she wants pt to come home with Eric Eric [name] Eric Eric [name]). Eric Eric [name] denies any further concerns at this  time. CSW will continue to provide support.  Emotional Assessment Appearance:  Appears stated age Attitude/Demeanor/Rapport:  Unable to Assess Affect (typically observed):  Unable to Assess Orientation:  Oriented to Self Alcohol / Substance use:  Not Applicable Psych involvement (Current and /or in the community):  No (Comment)  Discharge Needs  Concerns to be addressed:  Patient refuses services(Eric Eric [name] ) Readmission within the last 30 days:  Yes Current discharge risk:  Dependent with Mobility, Lives alone Barriers to Discharge:  Continued Medical Work up   [employer], LCSW 08/27/2017, 4:06 PM

## 2017-08-27 NOTE — Procedures (Signed)
  ELECTROENCEPHALOGRAM REPORT  Date of Study: 08/27/17  Patient's Name: Eric [name] MRN: [id number] Date of Birth: Apr 12, 1924  Referring Provider: Toy Baker, MD  Clinical History: [name] is a 84 y.o.  male with PMHx: CHF (EF 65-70%), HTN, HLD, DM, Stage 3 CKD, seizure disorder, CAD, and dementia. Admitted for staring episode with acute on chronic CHF. Patient was noted yesterday to be staring off into space and was not responsive at the time. This lasted about 10 minutes. By time EMS arrived he was responsive, oriented to self and partially to place. Seen by Dr. Leonie Man who has increased his dilantin from 100 to 200 mg a day. Blood pressure on arrival 190/90. Is noted to be bradycardic with Heart rate dropping into the 30s. Nurse notes that when this happens,level of alertness decreases. No lip smacking movements, jerking movements of limbs, gaze deviation or head turning noted by either daughter her bedside nurse.  Head CT with atrophy.   Medications: Scheduled Meds: . amLODipine  10 mg Oral QPM  . benzonatate  100 mg Oral TID  . calcitRIOL  0.25 mcg Oral Daily  . Chlorhexidine Gluconate Cloth  6 each Topical Q0600  . cloNIDine  0.1 mg Oral BID  . donepezil  10 mg Oral QHS  . enoxaparin (LOVENOX) injection  30 mg Subcutaneous Daily  . feeding supplement (ENSURE ENLIVE)  237 mL Oral BID BM  . furosemide  40 mg Intravenous BID  . guaiFENesin  1,200 mg Oral BID  . hydrALAZINE  75 mg Oral Q8H  . insulin aspart  0-9 Units Subcutaneous Q4H  . latanoprost  1 drop Both Eyes QHS  . loratadine  10 mg Oral Q lunch  . mupirocin ointment  1 application Nasal BID  . pantoprazole  40 mg Oral Daily  . phenytoin  200 mg Oral Daily  . senna  1 tablet Oral Daily  . sodium chloride flush  3 mL Intravenous Q12H   Continuous Infusions: . sodium chloride     PRN Meds:.sodium chloride, albuterol, hydrALAZINE, hydrOXYzine, ondansetron **OR** ondansetron (ZOFRAN) IV, polyethylene  glycol, sodium chloride flush, triamcinolone 0.1 % cream : eucerin            Technical Summary: This is a standard 16 channel EEG recording performed according to the international 10-20 electrode system.  AP bipolar, transverse bipolar, and referential montages were obtained, and digitally reformatted as necessary.  Duration of tracing: 22:39  Description: In the awake state there is a 3-4 Hz theta rhythm seen from the posterior head regions in a symmetric fashion.  Drowsiness is noted by vertex slowing, however no definite stage II sleep was identified.   Neither hyperventilation or photic stimulation was performed.  EKG was monitored and noted to be sinus rhythym with an average heart rate of 84 bpm.  No epileptiform changes were noted.  Impression: This is an abnormal EEG due to background slowing seen throughout the tracing.  This is a non-specific finding that can be seen with toxic, metabolic, diffuse, or multifocal structural processes, and could be consistent with the patients diagnosis of dementia.  No definite epileptiform changes were noted.   A single EEG without epileptiform changes does not exclude the diagnosis of epilepsy. Clinical correlation advised.   Carvel Getting, M.D. Neurology Cell [phone]

## 2017-08-27 NOTE — Clinical Social Work Note (Signed)
Pt's sister states pt will d/c home with [facility] (HH)--pt was set up with them PTA. RNCM notified. Clinical Social Worker will sign off for now as social work intervention is no longer needed. Please consult [place] again if new need arises.   Kasten Leveque A Fabrizio Filip 08/27/2017.

## 2017-08-27 NOTE — Progress Notes (Signed)
EEG completed, results pending. 

## 2017-08-27 NOTE — Consult Note (Addendum)
Cardiology Consult    Patient ID: [name]; [id number]; [birth year]   Admit date: 08/26/2017 Date of Consult: 08/27/2017  Primary Care Provider: [name], [name] Primary Cardiologist: Previously followed by Eric [name] in 2012  Patient Profile    Eric [name] is a 84 y.o. male with past medical history of chronic diastolic CHF (EF 70-17% by echo in 09/2016), HTN, HLD, Type 1 DM, Stage 3 CKD, seizure disorder, nonobstructive CAD by cath in 2013, and dementia who is being seen today for the evaluation of CHF, elevated troponin values, and bradycardia at the request of Dr. Roel [name].   History of Present Illness    Eric [name] was recently admitted to [name] in 07/2017 for evaluation of decreased responsiveness. He was found to have a urinary tract infection with urine culture significant for E. coli, therefore he was appropriately treated with antibiotics and discharged back to SNF.  He presented back to the ED on 08/26/2017 new onset "staring and unresponsiveness". History was provided by his sister as he has dementia at baseline. She reported the episode lasted for approximately 10 minutes and he appeared short of breath at that time. He has been followed by Neurology for his seizure disorder and Dilantin dosing was further titrated at the time of his recent office visit on 08/22/2017.   Initial labs showed WBC 5.1, Hgb 9.9, platelets 227, Na+ 137, K+ 3.7, and creatinine 2.24 (baseline 1.7 - 1.8). Lactic Acid 1.5. Initial and delta troponin values found to be flat at 0.10 and 0.10 (values elevated to 0.21 during admission in 09/2016). EKG shows NSR, HR 66, with 1st degree AV Block and no acute ST or T-wave changes when compared to prior tracings. CT Head showed advanced atrophy with no acute intracranial findings. CXR showing acute bilateral pulmonary opacities most suggestive of acute pulmonary edema with small effusions and bibasilar atelectasis.   He was given IV Lasix 40 mg  while in the ED and is -1.1 L since admission. BP was elevated to 196/94 on arrival but has improved to 156/77 on his recent check. He has been followed on telemetry and by personal review of this has experienced intermittent episodes of Mobitz II overnight. Now back in a NSR with HR in the 70's and intermittent episodes of Wenckebach.   In talking with the patient this morning, he is alert and oriented x2 (person and place). He denies any current pain but does report feeling slightly short of breath. Currently on 2L Eric [name] with oxygen saturations at 97-98%.   Past Medical History:  Diagnosis Date  . Adult failure to thrive   . Altered mental status   . ANEMIA-NOS 02/17/2007  . Anxiety   . BACK PAIN, LUMBAR   . BENIGN PROSTATIC HYPERTROPHY   . Bilateral leg edema   . CAD (coronary artery disease)    a. nonobstructive CAD by cath in 2013  . CAP (community acquired pneumonia)   . CKD (chronic kidney disease)   . DEPRESSION 12/25/2008  . Diabetes mellitus   . DIABETES MELLITUS, TYPE I, CONTROLLED, WITH RETINOPATHY   . Encephalopathy, metabolic   . Gastroparesis   . GERD 02/17/2007  . Hyperlipidemia   . HYPERTENSION 02/17/2007  . Hyponatremia   . LIVER DISORDER   . OSTEOARTHRITIS 03/04/2010  . Pneumonia   . PSA, INCREASED   . Stroke ([name])   . UTI (urinary tract infection) 07/2017  . VITAMIN B12 DEFICIENCY     Past Surgical History:  Procedure  Laterality Date  . LEFT HEART CATHETERIZATION WITH CORONARY ANGIOGRAM N/A 04/23/2011   Procedure: LEFT HEART CATHETERIZATION WITH CORONARY ANGIOGRAM;  Surgeon: Eric Huizinga M [place], [name];  Location: [name];  Service: Cardiovascular;  Laterality: N/A;  . TRANSURETHRAL RESECTION OF PROSTATE       Home Medications:  Prior to Admission medications   Medication Sig Start Date End Date Taking? Authorizing Provider  acetaminophen (TYLENOL) 500 MG tablet Take 500 mg by mouth 2 (two) times daily. Reported on 06/19/2015   Yes Eric [name]    amLODipine (NORVASC) 10 MG tablet Take 10 mg by mouth every evening.  05/26/16  Yes Eric [name]  calamine lotion Apply 1 application topically 2 (two) times daily as needed for itching (to shoulders/ back).   Yes Eric [name]  calcitRIOL (ROCALTROL) 0.25 MCG capsule Take 0.25 mcg by mouth daily.  12/26/11  Yes Eric [name]  cholecalciferol (VITAMIN D) 1000 UNITS tablet Take 2,000 Units by mouth daily.    Yes Eric [name]  cloNIDine (CATAPRES) 0.1 MG tablet Take 0.1 mg by mouth 2 (two) times daily.  05/03/16  Yes Eric [name]  donepezil (ARICEPT) 10 MG tablet Take 1 tablet (10 mg total) by mouth at bedtime. 06/22/16  Yes Eric Bible, NP  fluticasone [name]) 50 MCG/ACT nasal spray Place 1 spray into both nostrils 2 (two) times daily. Patient taking differently: Place 1 spray into both nostrils 2 (two) times daily as needed for allergies.  10/04/16  Yes Eric [name]  furosemide (LASIX) 20 MG tablet Take 20 mg by mouth daily.   Yes Eric [name]  hydrALAZINE (APRESOLINE) 25 MG tablet Take 75 mg by mouth 3 (three) times daily.  10/22/14  Yes Eric [name]  hydrOXYzine (ATARAX/VISTARIL) 10 MG tablet TAKE 1 TABLET BY MOUTH TWICE DAILY AS NEEDED FOR ITCHING 12/10/14  Yes Eric [name]  insulin aspart (NOVOLOG FLEXPEN) 100 UNIT/ML FlexPen Inject 0-5 Units into the skin 3 (three) times daily as needed for high blood sugar (sliding scale).   Yes Eric [name]  Insulin Glargine (LANTUS SOLOSTAR) 100 UNIT/ML Solostar Pen Inject 25 Units into the skin every morning.    Yes Eric [name]  latanoprost (XALATAN) 0.005 % ophthalmic solution Place 1 drop into both eyes at bedtime. 06/11/17  Yes Eric [name]  loratadine (CLARITIN) 10 MG tablet Take 10 mg by mouth daily with lunch.   Yes Eric [name]  LUMIGAN 0.01 % SOLN Place 1 drop into both eyes at bedtime. 08/03/16   Yes Eric [name]  metoprolol succinate (TOPROL-XL) 50 MG 24 hr tablet Take 50 mg by mouth daily. 06/30/17  Yes Eric [name]  OVER THE COUNTER MEDICATION Apply 1 application topically 4 (four) times daily as needed (clotrimazole= to the buttocks for rash as needed).    Yes Eric [name]  pantoprazole (PROTONIX) 40 MG tablet Take 40 mg by mouth daily.  05/04/15  Yes Eric [name]  phenytoin (DILANTIN) 100 MG ER capsule Take 200 mg by mouth daily.   Yes Eric [name]  Polyethyl Glycol-Propyl Glycol (SYSTANE ULTRA) 0.4-0.3 % SOLN Place 1 drop into both eyes 3 (three) times daily as needed (dry eyes).   Yes Eric [name]  polyethylene glycol powder (GLYCOLAX/MIRALAX) powder Take 17 g by mouth daily as needed for mild constipation.  05/18/14  Yes Eric [name]  Probiotic Product (PROBIOTIC-10 PO) Take 1 capsule by mouth daily.   Yes  Eric [name]  senna (SENOKOT) 8.6 MG TABS Take 1 tablet by mouth daily.   Yes Eric [name]  ACCU-CHEK AVIVA PLUS test strip  05/01/14   Eric [name]  B-D UF III MINI PEN NEEDLES 31G X 5 MM MISC  06/20/14   Eric [name]  diclofenac sodium (VOLTAREN) 1 % GEL Apply 2 g topically 4 (four) times daily. Patient not taking: Reported on 08/26/2017 03/08/17   Leandrew Koyanagi, [name]    Inpatient Medications: Scheduled Meds: . amLODipine  10 mg Oral QPM  . calcitRIOL  0.25 mcg Oral Daily  . Chlorhexidine Gluconate Cloth  6 each Topical Q0600  . cloNIDine  0.1 mg Oral BID  . donepezil  10 mg Oral QHS  . enoxaparin (LOVENOX) injection  30 mg Subcutaneous Daily  . furosemide  40 mg Intravenous BID  . guaiFENesin  600 mg Oral BID  . hydrALAZINE  75 mg Oral Q8H  . insulin aspart  0-9 Units Subcutaneous Q4H  . latanoprost  1 drop Both Eyes QHS  . loratadine  10 mg Oral Q lunch  . mupirocin ointment  1 application Nasal BID  . pantoprazole  40 mg Oral Daily  .  phenytoin  200 mg Oral Daily  . senna  1 tablet Oral Daily  . sodium chloride flush  3 mL Intravenous Q12H   Continuous Infusions: . sodium chloride     PRN Meds: sodium chloride, albuterol, hydrALAZINE, hydrOXYzine, ondansetron **OR** ondansetron (ZOFRAN) IV, polyethylene glycol, sodium chloride flush, triamcinolone 0.1 % cream : eucerin  Allergies:    Allergies  Allergen Reactions  . Percocet [Oxycodone-Acetaminophen] Rash    Social History:   Social History   Socioeconomic History  . Marital status: Widowed    Spouse name: Not on file  . Number of children: Not on file  . Years of education: Not on file  . Highest education level: Not on file  Occupational History  . 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 Financial resource strain: Not on file  . Food insecurity:    Worry: Not on file    Inability: Not on file  . Transportation needs:    Medical: Not on file    Non-medical: Not on file  Tobacco Use  . Smoking status: Never Smoker  . Smokeless tobacco: Never Used  Substance and Sexual Activity  . Alcohol use: No  . Drug use: No  . Sexual activity: Never  Lifestyle  . Physical activity:    Days per week: Not on file    Minutes per session: Not on file  . Stress: Not on file  Relationships  . Social connections:    Talks on phone: Not on file    Gets together: Not on file    Attends religious service: Not on file    Active member of club or organization: Not on file    Attends meetings of clubs or organizations: Not on file    Relationship status: Not on file  . Intimate partner violence:    Fear of current or ex partner: Not on file    Emotionally abused: Not on file    Physically abused: Not on file    Forced sexual activity: Not on file  Other Topics Concern  . Not on file  Social History Narrative   Works Armed forces operational officer.   Widowed 2010.   Regular exercise-no     Family History:  Family History   Problem Relation Age of Onset  . Cancer Father        had uncertain type of cancer  . Cancer Brother        Prostate Cancer  . Cancer Brother        Prostate Cancer  . Diabetes Other       Review of Systems    General:  No chills, fever, night sweats or weight changes.  Cardiovascular:  No chest pain, edema, orthopnea, palpitations, paroxysmal nocturnal dyspnea. Positive for dyspnea.  Dermatological: No rash, lesions/masses Respiratory: No cough, dyspnea Urologic: No hematuria, dysuria Abdominal:   No nausea, vomiting, diarrhea, bright red blood per rectum, melena, or hematemesis Neurologic:  No visual changes or wkns. Positive for changes in mental status. All other systems reviewed and are otherwise negative except as noted above.  Physical Exam/Data    Vitals:   08/27/17 0013 08/27/17 0427 08/27/17 0630 08/27/17 0725  BP: (!) 188/94 (!) 184/51  (!) 156/77  Pulse: 74 84  76  Resp: 18 16  16   Temp: 98.1 F (36.7 C) 97.6 F (36.4 C)  97.7 F (36.5 C)  TempSrc: Oral Oral  Oral  SpO2: 92% 100%  97%  Weight:   167 lb (75.8 kg)   Height:        Intake/Output Summary (Last 24 hours) at 08/27/2017 0839 Last data filed at 08/27/2017 0700 Gross per 24 hour  Intake 110 ml  Output 1175 ml  Net -1065 ml   Filed Weights   08/27/17 0011 08/27/17 0630  Weight: 167 lb (75.8 kg) 167 lb (75.8 kg)   Body mass index is 24.66 kg/m.   General: Pleasant, elderly African American male appearing in NAD Psych: Normal affect. Neuro: Alert and oriented X 2 (person, place). Moves all extremities spontaneously. HEENT: Normal  Neck: Supple without bruits or JVD. Lungs:  Resp regular and unlabored, decreased breath sounds along bases bilaterally Heart: RRR no s3, s4, or murmurs. Abdomen: Soft, non-tender, non-distended, BS + x 4.  Extremities: No clubbing. 1+ pitting edema bilaterally. DP/PT/Radials 2+ and equal bilaterally.   EKG:  The EKG was personally reviewed and demonstrates:  NSR, HR 66, with 1st degree AV Block and no acute ST or T-wave changes when compared to prior tracings.    Labs/Studies     Relevant CV Studies:  Echocardiogram: 09/2016 Study Conclusions  - Left ventricle: The cavity size was normal. Wall thickness was   increased in a pattern of moderate LVH. Systolic function was   vigorous. The estimated ejection fraction was in the range of 65%   to 70%. Wall motion was normal; there were no regional wall   motion abnormalities. Doppler parameters are consistent with   abnormal left ventricular relaxation (grade 1 diastolic   dysfunction). The E/e&' ratio is >15, suggesting elevated LV   filling pressure. - Mitral valve: Mild sclerosis - trivial MR. - Left atrium: The atrium was normal in size. - Tricuspid valve: There was moderate regurgitation. - Pulmonary arteries: PA peak pressure: 60 mm Hg (S). - Inferior vena cava: The vessel was normal in size. The   respirophasic diameter changes were in the normal range (>= 50%),   consistent with normal central venous pressure.  Impressions:  - Compared to a prior study in 2016, the LVEF is higher at 65-70%.   Cardiac Catheterization: 04/2011 Procedural Findings: Hemodynamics: AO 152/65 with a mean of 100 mm mercury LV 152/22 mmHg  Coronary angiography: Coronary dominance: right  Left mainstem: Short and normal.  Left anterior descending (LAD): 30-40% disease in the proximal vessel. No other significant disease noted.  Left circumflex (LCx): Less than 10% irregularities.  Right coronary artery (RCA): 10-20% plaque noted in the proximal and mid vessel.  Left ventriculography: Left ventricular systolic function is normal, LVEF is estimated at 55-65%, there is no significant mitral regurgitation   Final Conclusions:   1. Nonobstructive coronary disease. 2. Normal left ventricular function.  Recommendations: Based on these findings it appears that his cardiac enzyme  elevation was related to the stress of his acute pneumonia. Would continue risk factor modification.   Laboratory Data:  Chemistry Recent Labs  Lab 08/26/17 1453 08/27/17 0630  NA 137 139  K 3.7 3.6  CL 107 108  CO2 21* 22  GLUCOSE 144* 142*  BUN 29* 25*  CREATININE 2.24* 2.00*  CALCIUM 8.7* 8.8*  GFRNONAA 24* 27*  GFRAA 28* 32*  ANIONGAP 9 9    Recent Labs  Lab 08/26/17 1453 08/27/17 0630  PROT 6.6 6.2*  ALBUMIN 2.7* 2.6*  AST 25 23  ALT 26 24  ALKPHOS 123 113  BILITOT 0.6 0.4   Hematology Recent Labs  Lab 08/26/17 1453 08/26/17 1927 08/27/17 0630  WBC 5.1  --  5.6  RBC 3.00* 3.25* 2.87*  HGB 9.9*  --  9.4*  HCT 30.3*  --  28.9*  MCV 101.0*  --  100.7*  MCH 33.0  --  32.8  MCHC 32.7  --  32.5  RDW 15.2  --  15.1  PLT 327  --  314   Cardiac Enzymes Recent Labs  Lab 08/26/17 1927 08/27/17 0043 08/27/17 0630  TROPONINI 0.10* 0.10* 0.09*   No results for input(s): TROPIPOC in the last 168 hours.  BNPNo results for input(s): BNP, PROBNP in the last 168 hours.  DDimer No results for input(s): DDIMER in the last 168 hours.  Radiology/Studies:  Dg Chest 2 View  Result Date: 08/26/2017 CLINICAL DATA:  84 year old male with cough and altered mental status. EXAM: CHEST - 2 VIEW COMPARISON:  Chest radiographs 08/02/2017 and earlier. FINDINGS: Diffuse bilateral pulmonary interstitial opacity with confluent perihilar and bilateral lung base opacity. Evidence of small layering pleural effusions on the lateral view. Visible mediastinal contours are stable. No pneumothorax. Visualized tracheal air column is within normal limits. No acute osseous abnormality identified. Calcified aortic atherosclerosis. Negative visible bowel gas pattern. IMPRESSION: 1. Acute bilateral pulmonary opacity most suggestive of acute pulmonary edema with small pleural effusions and bibasilar atelectasis. Bilateral pneumonia is less likely. 2.  Aortic Atherosclerosis (ICD10-I70.0).  Electronically Signed   By: Genevie Ann M.D.   On: 08/26/2017 16:15   Ct Head Wo Contrast  Result Date: 08/26/2017 CLINICAL DATA:  Acute onset of staring and unresponsiveness. History of dementia. EXAM: CT HEAD WITHOUT CONTRAST TECHNIQUE: Contiguous axial images were obtained from the base of the skull through the vertex without intravenous contrast. COMPARISON:  08/02/2017. FINDINGS: Brain: No evidence for acute infarction, hemorrhage, mass lesion, or extra-axial fluid. Generalized atrophy. Hydrocephalus ex vacuo. Chronic microvascular ischemic change likely accounts for hypoattenuation of the white matter. Vascular: Calcification of the cavernous internal carotid arteries consistent with cerebrovascular atherosclerotic disease. No signs of intracranial large vessel occlusion. Skull: The calvarium is intact. Incidental dural calcification. Hypertrophic pannus surrounds the odontoid. Sinuses/Orbits: No layering sinus fluid.  Negative orbits. Other: None. Similar appearance to priors. IMPRESSION: Advanced atrophy.  No acute intracranial findings. Electronically Signed   By: Roderic Ovens.D.  On: 08/26/2017 14:55     Assessment & Plan    1. Acute on Chronic Diastolic CHF - presented with new-onset episodes of "staring and unresponsiveness" as noted by family. He did report being short of breath while in the ED and CXR showing acute bilateral pulmonary opacities most suggestive of acute pulmonary edema with small effusions and bibasilar atelectasis. - was given IV Lasix 40 mg while in the ED and is -1.1 L since admission. Creatinine has improved to 2.00. Continue with IV Lasix as he still has decreased breath sounds on examination but will adjust dosing to 40mg  BID. Will check BNP and repeat BMET in AM. Monitor I&O's along with daily weights.   2. Elevated Troponin Values - initial and cyclic troponin values have been flat at 0.10, 0.10, and 0.09. EKG shows no acute ischemic changes and he denies any  recent episodes of chest pain.  - echocardiogram is pending to assess LV function and wall motion. Had mild nonobstructive CAD by cath in 2013 as outlined above. Would not anticipate further ischemic evaluation. Elevated values likely secondary to demand ischemia in the setting of Stage 3 CKD.   3. Intermittent 2nd Degree, Type 2 Heart Block - noted to have intermittent episodes of Mobitz II on telemetry overnight. Now in NSR (HR in the 60's to 70's) with occasional episodes of Wenckebach. Will stop Toprol-XL. Continue to follow on telemetry. Not an ideal candidate for advanced therapies given his advanced age and dementia.  4. Stage 3 CKD - baseline creatinine 1.7 - 1.8. Elevated to 2.24 on admission, improved to 2.00 this morning with diuresis.  - repeat BMET in AM.   5. HTN - BP elevated to 196/94 while in the ED, improved to 156/77 on most recent check.  - continue PTA medication regimen of Amlodipine, Clonidine, and Hydralazine. Will stop Toprol-XL due to his episodes of bradycardia. Can further titrate Hydralazine to 100mg  TID if additional BP control is needed.   6. Decreased Responsiveness/Seizure Disorder - CT head showed advanced atrophy with no acute intracranial findings. - Neurology following.    For questions or updates, please contact [place] Please consult [email or web address] for contact info under Cardiology/STEMI.  Signed, Erma Heritage, PA-C 08/27/2017, 8:39 AM Pager: [phone]  Patient examined chart reviewed discussed care with PA Exam with chronically ill black male. Lethargic but no focal neurologic deficits Poor inspiratory effort no murmur and plus one LE edema. Reviewed of telemetry shows SR with periods of 2:1 block and 3:2 wenkebach. No really long pauses and nothing that would explain his change in mental status. No chest pain baseline ECG with LVH no ischemic changes and cath in 2013 no significant CAD. Agree with holding beta blocker and no indication  for cath or further evaluation of CAD at this time   Jenkins Rouge

## 2017-08-28 ENCOUNTER — Other Ambulatory Visit (HOSPITAL_COMMUNITY): Payer: Medicare Other

## 2017-08-28 DIAGNOSIS — L8990 Pressure ulcer of unspecified site, unspecified stage: Secondary | ICD-10-CM

## 2017-08-28 LAB — BASIC METABOLIC PANEL
Anion gap: 12 (ref 5–15)
BUN: 33 mg/dL — AB (ref 6–20)
CALCIUM: 9 mg/dL (ref 8.9–10.3)
CO2: 19 mmol/L — ABNORMAL LOW (ref 22–32)
Chloride: 104 mmol/L (ref 101–111)
Creatinine, Ser: 2.36 mg/dL — ABNORMAL HIGH (ref 0.61–1.24)
GFR calc Af Amer: 26 mL/min — ABNORMAL LOW (ref >60)
GFR, EST NON AFRICAN AMERICAN: 23 mL/min — AB (ref >60)
GLUCOSE: 143 mg/dL — AB (ref 65–99)
Potassium: 4.1 mmol/L (ref 3.5–5.1)
Sodium: 135 mmol/L (ref 135–145)

## 2017-08-28 LAB — GLUCOSE, CAPILLARY
GLUCOSE-CAPILLARY: 138 mg/dL — AB (ref 65–99)
GLUCOSE-CAPILLARY: 137 mg/dL — AB (ref 65–99)
GLUCOSE-CAPILLARY: 97 mg/dL (ref 65–99)
Glucose-Capillary: 148 mg/dL — ABNORMAL HIGH (ref 65–99)
Glucose-Capillary: 205 mg/dL — ABNORMAL HIGH (ref 65–99)
Glucose-Capillary: 220 mg/dL — ABNORMAL HIGH (ref 65–99)

## 2017-08-28 LAB — BRAIN NATRIURETIC PEPTIDE: B NATRIURETIC PEPTIDE 5: 788.1 pg/mL — AB (ref 0.0–100.0)

## 2017-08-28 NOTE — Evaluation (Addendum)
Occupational Therapy Evaluation Patient Details Name: Eric [name] MRN: [id number] DOB: 1923/05/07 Today's Date: 08/28/2017    History of Present Illness 84 y.o. male with PMHx: CHF (EF 65-70%), HTN, HLD, DM, Stage 3 CKD, seizure disorder, CAD, and dementia. Admitted for staring episode with acute on chronic CHF   Clinical Impression   Pt agreeable to participate with OT evaluation today. He has a history of cognitive deficits and was not able to provide accurate PLOF information today. Pt reports that he was ambulating with a RW and per chart was living with his family. Pt currently requiring min assist +2 for functional mobility during ADL and IADL tasks and is able to complete LB ADL with min assist. He benefits from simple commands. Pt currently requires assistance for ADL participation and will need 24 hour assistance post-acute D/C. If family unable to provide this level of assistance, feel that pt will need SNF for continued therapies.    He did demonstrate SpO2 desaturation to 85% during functional mobility on RA with rebound to 95% with standing rest break.    Follow Up Recommendations  SNF;Supervision/Assistance - 24 hour    Equipment Recommendations  Other (comment)(defer to next venue of care)    Recommendations for Other Services       Precautions / Restrictions Precautions Precautions: Fall Restrictions Weight Bearing Restrictions: No      Mobility Bed Mobility Overal bed mobility: Needs Assistance Bed Mobility: Supine to Sit     Supine to sit: Min guard     General bed mobility comments: Guarding assist for safety.   Transfers Overall transfer level: Needs assistance Equipment used: Rolling walker (2 wheeled) Transfers: Sit to/from Stand Sit to Stand: Min assist;+2 safety/equipment         General transfer comment: Min assist to power up safely from EOB.     Balance Overall balance assessment: Needs assistance;History of Falls Sitting-balance  support: Feet supported Sitting balance-Leahy Scale: Fair Sitting balance - Comments: posterior lean in sitting   Standing balance support: Bilateral upper extremity supported;During functional activity Standing balance-Leahy Scale: Poor Standing balance comment: Heavily reliant on RW for BUE support.                            ADL either performed or assessed with clinical judgement   ADL Overall ADL's : Needs assistance/impaired Eating/Feeding: Sitting;Supervision/ safety   Grooming: Minimal assistance;Standing   Upper Body Bathing: Minimal assistance;Sitting   Lower Body Bathing: Minimal assistance;Sit to/from stand   Upper Body Dressing : Minimal assistance;Sitting   Lower Body Dressing: Minimal assistance;Sit to/from stand   Toilet Transfer: Minimal assistance;Ambulation;RW;+2 for safety/equipment Toilet Transfer Details (indicate cue type and reason): simulated with sit<>stand followed by mobility in room Toileting- Clothing Manipulation and Hygiene: Minimal assistance;Sit to/from stand       Functional mobility during ADLs: Minimal assistance;Rolling walker;+2 for safety/equipment General ADL Comments: Pt requiring simple commands. Able to adjust his socks at EOB and complete toilet transfers with min assist +2 today.      Vision Patient Visual Report: No change from baseline Vision Assessment?: Vision impaired- to be further tested in functional context Additional Comments: Will continue to assess     Perception     Praxis      Pertinent Vitals/Pain Pain Assessment: No/denies pain     Hand Dominance     Extremity/Trunk Assessment Upper Extremity Assessment Upper Extremity Assessment: LUE deficits/detail;RUE deficits/detail RUE Deficits / Details: Generally  weak; 3/5 strength LUE Deficits / Details: Decreased strength as compared to R with 2/5 strength grossly.    Lower Extremity Assessment Lower Extremity Assessment: Generalized weakness    Cervical / Trunk Assessment Cervical / Trunk Assessment: Kyphotic   Communication Communication Communication: No difficulties   Cognition Arousal/Alertness: Awake/alert Behavior During Therapy: WFL for tasks assessed/performed Overall Cognitive Status: No family/caregiver present to determine baseline cognitive functioning Area of Impairment: Memory;Following commands;Safety/judgement;Problem solving;Orientation                 Orientation Level: Disoriented to;Time;Situation;Place   Memory: Decreased short-term memory;Decreased recall of precautions Following Commands: Follows one step commands consistently;Follows multi-step commands inconsistently Safety/Judgement: Decreased awareness of safety;Decreased awareness of deficits   Problem Solving: Slow processing;Difficulty sequencing;Requires verbal cues;Requires tactile cues General Comments: Pt with history of cognitive deficits. Pleasant and following commands.    General Comments  Pt able to ambulate in hallway approximately 50 feet. SpO2 desaturation to 85% on RA during mobility and rebound to 95% with standing rest break.      Exercises     Shoulder Instructions      Home Living Family/patient expects to be discharged to:: Skilled nursing facility                                 Additional Comments: Pt reports "I think I live by myself but I do not know" but per chart lives with family.       Prior Functioning/Environment Level of Independence: Needs assistance  Gait / Transfers Assistance Needed: Pt reports using walker ADL's / Homemaking Assistance Needed: Pt unable to report but likely assistance from family.    Comments: No family present and pt unable to provide accurate report of PLOF.         OT Problem List: Decreased strength;Decreased activity tolerance;Impaired balance (sitting and/or standing);Decreased safety awareness;Decreased knowledge of use of DME or AE;Decreased knowledge  of precautions;Decreased range of motion;Pain;Cardiopulmonary status limiting activity      OT Treatment/Interventions: Self-care/ADL training;Therapeutic exercise;Energy conservation;DME and/or AE instruction;Therapeutic activities;Patient/family education;Balance training    OT Goals(Current goals can be found in the care plan section) Acute Rehab OT Goals Patient Stated Goal: take a nap OT Goal Formulation: With patient Time For Goal Achievement: 09/11/17 Potential to Achieve Goals: Good ADL Goals Pt Will Perform Grooming: with adaptive equipment;with supervision Pt Will Perform Lower Body Dressing: with supervision;sit to/from stand Pt Will Transfer to Toilet: with supervision;bedside commode;ambulating Pt Will Perform Toileting - Clothing Manipulation and hygiene: with supervision;sit to/from stand  OT Frequency: Min 1X/week   Barriers to D/C:            Co-evaluation              AM-PAC PT "6 Clicks" Daily Activity     Outcome Measure Help from another person eating meals?: A Little Help from another person taking care of personal grooming?: A Little Help from another person toileting, which includes using toliet, bedpan, or urinal?: A Lot Help from another person bathing (including washing, rinsing, drying)?: A Little Help from another person to put on and taking off regular upper body clothing?: A Little Help from another person to put on and taking off regular lower body clothing?: A Little 6 Click Score: 17   End of Session Equipment Utilized During Treatment: Gait belt;Rolling walker Nurse Communication: Mobility status  Activity Tolerance: Patient tolerated treatment well Patient left: in chair;with  call bell/phone within reach;with chair alarm set  OT Visit Diagnosis: Other abnormalities of gait and mobility (R26.89)                Time: 8948-3475 OT Time Calculation (min): 18 min Charges:  OT General Charges $OT Visit: 1 Visit OT Evaluation $OT Eval  Moderate Complexity: 1 Mod G-Codes:     Norman Herrlich, MS OTR/L  Pager: [place] [facility] Aoife Bold 08/28/2017, 1:14 PM

## 2017-08-28 NOTE — Progress Notes (Addendum)
PROGRESS NOTE    Eric [name]  XNT:[id number] DOB: January 27, 1924 DOA: 08/26/2017 PCP: [name], MD   Brief Narrative: Patient is a 84 year old male with past medical history of dementia, hypertension, hyperlipidemia, CKD stage III, diabetes, seizure, diastolic CHF, TIA who presents from home to the emergency department with complaints of a staring episode, shortness of breath and cough.  Patient was just discharged from skilled nursing facility back to home with home health.  Patient was suspected to have seizure and neurology was consulted.  He follows with neurology as an outpatient for seizure disorder.  He was recently admitted here for the management of UTI.  Chest x-ray in the emergency department showed pulmonary infiltrates likely secondary to CHF and patient also had elevated troponin.  Cardiology consulted.  Assessment & Plan:   Active Problems:   Hyperlipidemia   ANEMIA-NOS   Essential hypertension   CKD (chronic kidney disease) stage 3, GFR 30-59 ml/min (HCC)   Acute metabolic encephalopathy   MGUS (monoclonal gammopathy of unknown significance)   Failure to thrive in adult   Hypertensive encephalopathy   Seizure (HCC)   DM2 (diabetes mellitus, type 2) (HCC)   Acute diastolic CHF (congestive heart failure) (HCC)   Staring episodes   Diastolic CHF (HCC)   Elevated troponin   AV block, Mobitz 2   Pressure injury of skin  Altered mental status: Patient found to have an episode of staring.  Unable to provide history on presentation.  Currently his mental status baseline.  CT head did not show any acute intracranial abnormalities..  Seizure disorder:Neurology was consulted for suspicion of seizure episode.  He has history of seizure disorder.Dilantin level found to be low.  He was loaded with Dilantin.Resumed dilantin at home dose.EEG did not show clear epileptic activities.  Diastolic CHF: Lasix held due to worsening kidney function. will monitor his input/output,daily  weight.  Patient has lower extremity edema.  Mildly elevated BNP.  Lungs were clear to auscultation today.  Bradycardia: Currently normal sinus rhythm.  EKG had shown intermittent episodes of type II heart block.  Toprol stopped.  Elevated troponin: Most likely secondary to CHF.  Does not complain of chest pain.  Cardiology following.  Echocardiogram has been ordered.  Anemia: Currently stable.  CKD stage III: Kidney function slightly worsened.  We will continue to monitor his kidney function.  Baseline creatinine around 2.  Hypertension: Home medications restarted. Toprol stopped for bradycardia. Currently blood pressure stable.  Patient had elevated blood pressure on presentation.  Deconditioning/advanced age/failure to thrive: PT/OT evaluation done.  Recommended skilled nursing facility on discharge.  Hyperlipidemia: Continue home medications.    MGUS: Follow up with hematology as an outpatient.  Diabetes type 2: Continue sliding scale insulin.  We will continue to monitor his blood sugars.    DVT prophylaxis: Heparin [place] Code Status: Full Family Communication: None present at the bed side Disposition Plan: DC to Skilled nursing facility tomorrow   Consultants: Cardiology,Neurology  Procedures:None  Antimicrobials:None  Subjective: Patient seen and examined at bedside this morning.  Looks more comfortable than yesterday.  Respiratory status is stable.  Cough has improved.  Objective: Vitals:   08/27/17 2320 08/28/17 0301 08/28/17 0456 08/28/17 0814  BP: (!) 160/73 (!) 151/70  (!) 174/87  Pulse: 87 87  (!) 102  Resp: 18 15  (!) 24  Temp: (!) 97.5 F (36.4 C) 97.8 F (36.6 C)  97.7 F (36.5 C)  TempSrc: Oral Oral  Oral  SpO2: 95% 95%  91%  Weight:   73.5 kg (162 lb)   Height:        Intake/Output Summary (Last 24 hours) at 08/28/2017 1134 Last data filed at 08/28/2017 0900 Gross per 24 hour  Intake 810 ml  Output 2575 ml  Net -1765 ml   Filed Weights    08/27/17 0011 08/27/17 0630 08/28/17 0456  Weight: 75.8 kg (167 lb) 75.8 kg (167 lb) 73.5 kg (162 lb)    Examination:  General exam: Appears calm and comfortable ,Not in distress,average built, elderly gentleman HEENT:PERRL,Oral mucosa moist, Ear/Nose normal on gross exam Respiratory system: Bilateral mildly decreased air entry, no crackles or wheezes heart Cardiovascular system: S1 & S2 heard, RRR. No JVD, murmurs, rubs, gallops or clicks. Gastrointestinal system: Abdomen is nondistended, soft and nontender. No organomegaly or masses felt. Normal bowel sounds heard. Central nervous system: Alert and oriented times 2. No focal neurological deficits. Extremities: 1-2 + pedal edema, no clubbing ,no cyanosis, distal peripheral pulses palpable. Skin: No rashes, lesions ,stage 2 buttocks ulcers,no icterus ,no pallor    Data Reviewed: I have personally reviewed following labs and imaging studies  CBC: Recent Labs  Lab 08/26/17 1453 08/27/17 0630  WBC 5.1 5.6  NEUTROABS 3.4  --   HGB 9.9* 9.4*  HCT 30.3* 28.9*  MCV 101.0* 100.7*  PLT 327 101   Basic Metabolic Panel: Recent Labs  Lab 08/26/17 1453 08/27/17 0630 08/28/17 0554  NA 137 139 135  K 3.7 3.6 4.1  CL 107 108 104  CO2 21* 22 19*  GLUCOSE 144* 142* 143*  BUN 29* 25* 33*  CREATININE 2.24* 2.00* 2.36*  CALCIUM 8.7* 8.8* 9.0  MG  --  1.6*  --   PHOS  --  3.7  --    GFR: Estimated Creatinine Clearance: 20.4 mL/min (A) (by C-G formula based on SCr of 2.36 mg/dL (H)). Liver Function Tests: Recent Labs  Lab 08/26/17 1453 08/27/17 0630  AST 25 23  ALT 26 24  ALKPHOS 123 113  BILITOT 0.6 0.4  PROT 6.6 6.2*  ALBUMIN 2.7* 2.6*   No results for input(s): LIPASE, AMYLASE in the last 168 hours. No results for input(s): AMMONIA in the last 168 hours. Coagulation Profile: No results for input(s): INR, PROTIME in the last 168 hours. Cardiac Enzymes: Recent Labs  Lab 08/26/17 1927 08/27/17 0043 08/27/17 0630    TROPONINI 0.10* 0.10* 0.09*   BNP (last 3 results) No results for input(s): PROBNP in the last 8760 hours. HbA1C: Recent Labs    08/27/17 0043  HGBA1C 6.0*   CBG: Recent Labs  Lab 08/27/17 1638 08/27/17 1914 08/27/17 2317 08/28/17 0312 08/28/17 0819  GLUCAP 215* 168* 138* 138* 137*   Lipid Profile: No results for input(s): CHOL, HDL, LDLCALC, TRIG, CHOLHDL, LDLDIRECT in the last 72 hours. Thyroid Function Tests: Recent Labs    08/27/17 0043  TSH 3.008   Anemia Panel: Recent Labs    08/26/17 1927  VITAMINB12 276  FOLATE 9.2  FERRITIN 173  TIBC 192*  IRON 42*  RETICCTPCT 3.0   Sepsis Labs: Recent Labs  Lab 08/26/17 1927  LATICACIDVEN 1.5    Recent Results (from the past 240 hour(s))  MRSA PCR Screening     Status: Abnormal   Collection Time: 08/27/17 12:28 AM  Result Value Ref Range Status   MRSA by PCR POSITIVE (A) NEGATIVE Final    Comment:        The GeneXpert MRSA Assay (FDA approved for NASAL specimens only), is one  component of a comprehensive MRSA colonization surveillance program. It is not intended to diagnose MRSA infection nor to guide or monitor treatment for MRSA infections. RESULT CALLED TO, READ BACK BY AND VERIFIED WITH: Ellis Parents [phone] 08/27/2017 TRosalia Hammers          Radiology Studies: Dg Chest 2 View  Result Date: 08/26/2017 CLINICAL DATA:  84 year old male with cough and altered mental status. EXAM: CHEST - 2 VIEW COMPARISON:  Chest radiographs 08/02/2017 and earlier. FINDINGS: Diffuse bilateral pulmonary interstitial opacity with confluent perihilar and bilateral lung base opacity. Evidence of small layering pleural effusions on the lateral view. Visible mediastinal contours are stable. No pneumothorax. Visualized tracheal air column is within normal limits. No acute osseous abnormality identified. Calcified aortic atherosclerosis. Negative visible bowel gas pattern. IMPRESSION: 1. Acute bilateral pulmonary opacity most  suggestive of acute pulmonary edema with small pleural effusions and bibasilar atelectasis. Bilateral pneumonia is less likely. 2.  Aortic Atherosclerosis (ICD10-I70.0). Electronically Signed   By: Genevie Ann M.D.   On: 08/26/2017 16:15   Ct Head Wo Contrast  Result Date: 08/26/2017 CLINICAL DATA:  Acute onset of staring and unresponsiveness. History of dementia. EXAM: CT HEAD WITHOUT CONTRAST TECHNIQUE: Contiguous axial images were obtained from the base of the skull through the vertex without intravenous contrast. COMPARISON:  08/02/2017. FINDINGS: Brain: No evidence for acute infarction, hemorrhage, mass lesion, or extra-axial fluid. Generalized atrophy. Hydrocephalus ex vacuo. Chronic microvascular ischemic change likely accounts for hypoattenuation of the white matter. Vascular: Calcification of the cavernous internal carotid arteries consistent with cerebrovascular atherosclerotic disease. No signs of intracranial large vessel occlusion. Skull: The calvarium is intact. Incidental dural calcification. Hypertrophic pannus surrounds the odontoid. Sinuses/Orbits: No layering sinus fluid.  Negative orbits. Other: None. Similar appearance to priors. IMPRESSION: Advanced atrophy.  No acute intracranial findings. Electronically Signed   By: Staci Righter M.D.   On: 08/26/2017 14:55        Scheduled Meds: . amLODipine  10 mg Oral QPM  . benzonatate  100 mg Oral TID  . calcitRIOL  0.25 mcg Oral Daily  . Chlorhexidine Gluconate Cloth  6 each Topical Q0600  . cloNIDine  0.1 mg Oral BID  . donepezil  10 mg Oral QHS  . feeding supplement (ENSURE ENLIVE)  237 mL Oral BID BM  . guaiFENesin  1,200 mg Oral BID  . heparin injection (subcutaneous)  5,000 Units Subcutaneous Q8H  . hydrALAZINE  75 mg Oral Q8H  . insulin aspart  0-9 Units Subcutaneous Q4H  . latanoprost  1 drop Both Eyes QHS  . loratadine  10 mg Oral Q lunch  . mupirocin ointment  1 application Nasal BID  . pantoprazole  40 mg Oral Daily  .  phenytoin  200 mg Oral Daily  . senna  1 tablet Oral Daily  . sodium chloride flush  3 mL Intravenous Q12H   Continuous Infusions: . sodium chloride       LOS: 2 days    Time spent: 25 mins.More than 50% of that time was spent in counseling and/or coordination of care.      Shelly Coss, MD [facility] Pager [phone]  If 7PM-7AM, please contact night-coverage [email or web address] Password [facility] 08/28/2017, 11:34 AM

## 2017-08-28 NOTE — Progress Notes (Signed)
Subjective:  Denies SSCP, palpitations or Dyspnea   Objective:  Vitals:   08/27/17 2320 08/28/17 0301 08/28/17 0456 08/28/17 0814  BP: (!) 160/73 (!) 151/70  (!) 174/87  Pulse: 87 87  (!) 102  Resp: 18 15  (!) 24  Temp: (!) 97.5 F (36.4 C) 97.8 F (36.6 C)  97.7 F (36.5 C)  TempSrc: Oral Oral  Oral  SpO2: 95% 95%  91%  Weight:   162 lb (73.5 kg)   Height:        Intake/Output from previous day:  Intake/Output Summary (Last 24 hours) at 08/28/2017 1043 Last data filed at 08/28/2017 0900 Gross per 24 hour  Intake 810 ml  Output 3125 ml  Net -2315 ml    Physical Exam: Lethargic  Chronically ill black male  HEENT: normal Neck supple with no adenopathy JVP normal no bruits no thyromegaly Lungs clear with no wheezing and good diaphragmatic motion Heart:  S1/S2 no murmur, no rub, gallop or click PMI normal Abdomen: benighn, BS positve, no tenderness, no AAA no bruit.  No HSM or HJR Distal pulses intact with no bruits No edema Neuro non-focal Skin warm and dry No muscular weakness   Lab Results: Basic Metabolic Panel: Recent Labs    08/27/17 0630 08/28/17 0554  NA 139 135  K 3.6 4.1  CL 108 104  CO2 22 19*  GLUCOSE 142* 143*  BUN 25* 33*  CREATININE 2.00* 2.36*  CALCIUM 8.8* 9.0  MG 1.6*  --   PHOS 3.7  --    Liver Function Tests: Recent Labs    08/26/17 1453 08/27/17 0630  AST 25 23  ALT 26 24  ALKPHOS 123 113  BILITOT 0.6 0.4  PROT 6.6 6.2*  ALBUMIN 2.7* 2.6*   No results for input(s): LIPASE, AMYLASE in the last 72 hours. CBC: Recent Labs    08/26/17 1453 08/27/17 0630  WBC 5.1 5.6  NEUTROABS 3.4  --   HGB 9.9* 9.4*  HCT 30.3* 28.9*  MCV 101.0* 100.7*  PLT 327 314   Cardiac Enzymes: Recent Labs    08/26/17 1927 08/27/17 0043 08/27/17 0630  TROPONINI 0.10* 0.10* 0.09*   BNP: Invalid input(s): POCBNP D-Dimer: No results for input(s): DDIMER in the last 72 hours. Hemoglobin A1C: Recent Labs    08/27/17 0043    HGBA1C 6.0*   Fasting Lipid Panel: No results for input(s): CHOL, HDL, LDLCALC, TRIG, CHOLHDL, LDLDIRECT in the last 72 hours. Thyroid Function Tests: Recent Labs    08/27/17 0043  TSH 3.008   Anemia Panel: Recent Labs    08/26/17 1927  VITAMINB12 276  FOLATE 9.2  FERRITIN 173  TIBC 192*  IRON 42*  RETICCTPCT 3.0    Imaging: Dg Chest 2 View  Result Date: 08/26/2017 CLINICAL DATA:  84 year old male with cough and altered mental status. EXAM: CHEST - 2 VIEW COMPARISON:  Chest radiographs 08/02/2017 and earlier. FINDINGS: Diffuse bilateral pulmonary interstitial opacity with confluent perihilar and bilateral lung base opacity. Evidence of small layering pleural effusions on the lateral view. Visible mediastinal contours are stable. No pneumothorax. Visualized tracheal air column is within normal limits. No acute osseous abnormality identified. Calcified aortic atherosclerosis. Negative visible bowel gas pattern. IMPRESSION: 1. Acute bilateral pulmonary opacity most suggestive of acute pulmonary edema with small pleural effusions and bibasilar atelectasis. Bilateral pneumonia is less likely. 2.  Aortic Atherosclerosis (ICD10-I70.0). Electronically Signed   By: Genevie Ann M.D.   On: 08/26/2017 16:15   Ct Head  Wo Contrast  Result Date: 08/26/2017 CLINICAL DATA:  Acute onset of staring and unresponsiveness. History of dementia. EXAM: CT HEAD WITHOUT CONTRAST TECHNIQUE: Contiguous axial images were obtained from the base of the skull through the vertex without intravenous contrast. COMPARISON:  08/02/2017. FINDINGS: Brain: No evidence for acute infarction, hemorrhage, mass lesion, or extra-axial fluid. Generalized atrophy. Hydrocephalus ex vacuo. Chronic microvascular ischemic change likely accounts for hypoattenuation of the white matter. Vascular: Calcification of the cavernous internal carotid arteries consistent with cerebrovascular atherosclerotic disease. No signs of intracranial large  vessel occlusion. Skull: The calvarium is intact. Incidental dural calcification. Hypertrophic pannus surrounds the odontoid. Sinuses/Orbits: No layering sinus fluid.  Negative orbits. Other: None. Similar appearance to priors. IMPRESSION: Advanced atrophy.  No acute intracranial findings. Electronically Signed   By: Staci Righter M.D.   On: 08/26/2017 14:55    Cardiac Studies:  ECG:  SR LVH   Telemetry:  SR no AV block   Echo: June 18 EF 65% grade one diastolic   Medications:   . amLODipine  10 mg Oral QPM  . benzonatate  100 mg Oral TID  . calcitRIOL  0.25 mcg Oral Daily  . Chlorhexidine Gluconate Cloth  6 each Topical Q0600  . cloNIDine  0.1 mg Oral BID  . donepezil  10 mg Oral QHS  . feeding supplement (ENSURE ENLIVE)  237 mL Oral BID BM  . guaiFENesin  1,200 mg Oral BID  . heparin injection (subcutaneous)  5,000 Units Subcutaneous Q8H  . hydrALAZINE  75 mg Oral Q8H  . insulin aspart  0-9 Units Subcutaneous Q4H  . latanoprost  1 drop Both Eyes QHS  . loratadine  10 mg Oral Q lunch  . mupirocin ointment  1 application Nasal BID  . pantoprazole  40 mg Oral Daily  . phenytoin  200 mg Oral Daily  . senna  1 tablet Oral Daily  . sodium chloride flush  3 mL Intravenous Q12H     . sodium chloride      Assessment/Plan:  AV Block:  Resolved off Toprol if further issues would stop aricept no indication for further w/u Diastolic CHF: not sure of diagnosis BNP 788 good diuresis Azotemic now have stopped lasix today HTN:  Continue clonidine , hydralazine and norvasc Neuro:  Seizures on dilantin EEG no eliptiform spikes per neuro  Will sign off ok to ? Back to SNF or home with home health   Jenkins Rouge 08/28/2017, 10:43 AM

## 2017-08-28 NOTE — CV Procedure (Signed)
Attempted 2D Echo, Patient was on chair and was waiting for someone to help him eat his lunch. Will try again at a later time.  Eric [name]

## 2017-08-29 ENCOUNTER — Inpatient Hospital Stay (HOSPITAL_COMMUNITY): Payer: Medicare Other

## 2017-08-29 DIAGNOSIS — I361 Nonrheumatic tricuspid (valve) insufficiency: Secondary | ICD-10-CM

## 2017-08-29 LAB — ECHOCARDIOGRAM COMPLETE
HEIGHTINCHES: 69 in
Weight: 2592 oz

## 2017-08-29 LAB — BASIC METABOLIC PANEL
Anion gap: 9 (ref 5–15)
BUN: 31 mg/dL — AB (ref 6–20)
CALCIUM: 9.1 mg/dL (ref 8.9–10.3)
CO2: 25 mmol/L (ref 22–32)
Chloride: 104 mmol/L (ref 101–111)
Creatinine, Ser: 2.17 mg/dL — ABNORMAL HIGH (ref 0.61–1.24)
GFR calc Af Amer: 29 mL/min — ABNORMAL LOW (ref >60)
GFR calc non Af Amer: 25 mL/min — ABNORMAL LOW (ref >60)
Glucose, Bld: 123 mg/dL — ABNORMAL HIGH (ref 65–99)
POTASSIUM: 3.7 mmol/L (ref 3.5–5.1)
Sodium: 138 mmol/L (ref 135–145)

## 2017-08-29 LAB — GLUCOSE, CAPILLARY
GLUCOSE-CAPILLARY: 148 mg/dL — AB (ref 65–99)
Glucose-Capillary: 120 mg/dL — ABNORMAL HIGH (ref 65–99)
Glucose-Capillary: 179 mg/dL — ABNORMAL HIGH (ref 65–99)

## 2017-08-29 MED ORDER — GUAIFENESIN ER 600 MG PO TB12: 1200.0000 mg | ORAL_TABLET | Freq: Two times a day (BID) | ORAL | 0 refills | Status: DC

## 2017-08-29 MED ORDER — CLONIDINE HCL 0.2 MG PO TABS: 0.2000 mg | ORAL_TABLET | Freq: Two times a day (BID) | ORAL | 0 refills | Status: DC

## 2017-08-29 MED ORDER — CLONIDINE HCL 0.2 MG PO TABS
0.2000 mg | ORAL_TABLET | Freq: Two times a day (BID) | ORAL | Status: DC
  Administered 2017-08-29: 0.2 mg via ORAL
  Filled 2017-08-29: qty 1

## 2017-08-29 NOTE — Discharge Planning (Signed)
SATURATION QUALIFICATIONS: (This note is used to comply with regulatory documentation for home oxygen)  Patient Saturations on Room Air at Rest = 96  Patient Saturations on Room Air while Ambulating = 86 Patient Saturations on 2L Liters of oxygen while Ambulating = 97 Please briefly explain why patient needs home oxygen: Patient desats and is sob while ambultaing, states "I can't catch my breath."

## 2017-08-29 NOTE — Progress Notes (Addendum)
Inpatient Diabetes Program Recommendations  AACE/ADA: New Consensus Statement on Inpatient Glycemic Control (2015)  Target Ranges:  Prepandial:   less than 140 mg/dL      Peak postprandial:   less than 180 mg/dL (1-2 hours)      Critically ill patients:  140 - 180 mg/dL   Lab Results  Component Value Date   GLUCAP 148 (H) 08/29/2017   HGBA1C 6.0 (H) 08/27/2017    Review of Glycemic Control Results for Eric [name], Eric [name] (MRN [id number]) as of 08/29/2017 09:22  Ref. Range 08/28/2017 16:33 08/28/2017 20:54 08/28/2017 23:44 08/29/2017 04:30 08/29/2017 07:57  Glucose-Capillary Latest Ref Range: 65 - 99 mg/dL 205 (H) 97 148 (H) 120 (H) 148 (H)   Diabetes history: DM Outpatient Diabetes medications: Lantus 25 units qd Current orders for Inpatient glycemic control: Novolog sensitive correction q 4 hrs.  Inpatient Diabetes Program Recommendations:   Unclear if patient is type 1 or 2. -Add Lantus 10 units and adjust as needed. -When patient eating, change correction scale to tid with meals -May consider decrease in daily Lantus @ discharge due to A1c 6.0. -Noted patient has lost approx. 18 lbs over the past month.  Thank you, Nani Gasser. Lynita Groseclose, RN, MSN, CDE  Diabetes Coordinator Inpatient Glycemic Control Team Team Pager [phone] (8am-5pm) 08/29/2017 9:25 AM

## 2017-08-29 NOTE — Clinical Social Work Note (Signed)
Per RNCM, patient is agreeable to SNF placement. Patient was at [facility] from 4/19-5/9 and used up his 20 days that are covered at 100% by his insurance. Days 21-100 are covered at 80% and patient does not have a secondary insurance to cover this. This cost would be around $160 per day depending on the facility. Patient confirmed he cannot afford to pay this and is agreeable to returning home with home health. RNCM notified.  CSW signing off. Patient discharging home today.  Dayton Scrape, [place]

## 2017-08-29 NOTE — Progress Notes (Signed)
  Echocardiogram 2D Echocardiogram has been performed.  Eric [name] L Androw 08/29/2017, 8:44 AM

## 2017-08-29 NOTE — Care Management (Addendum)
CM offered oxygen agency choice - pt chose [facility] - agency contacted and referral accepted.    CSW informed CM that pt can not pay out of pocket for SNF, pt in agreement to discharge to home with [facility] provided by Alvis Lemmings - family will provide 24 hour supervision. Pt is in agreement for CM to contact sister and discuss discharge plan.   CM confirmed 24 hour supervision with sister Vicente Males - family will provide.  CM contacted [facility] and informed of discharge today.  Pt will transport home via private vehicle driven by sister.  Pt already has rolling walker, wheelchair, hospital bed and bedside commode in the home.  Sister/pt educated on daily weights and low salt intake.    CM assessed pt - pt is alert and oriented to person and place - CM confirmed this with bedside nurse.  Pt informed CM that if he discharges home he will be alone.  CM attempted to discuss potential HH with pt and pt informed CM that he is interested in discharging to SNF.  SNF has been recommended by [facility].  CSW informed that pt is now in agreement with SNF.

## 2017-08-29 NOTE — Progress Notes (Signed)
D/c instructions given to patients sister and verbalizes understanding. PIV d/c'd and patient taken out in [facility] with no complaints.

## 2017-08-29 NOTE — Discharge Summary (Addendum)
Physician Discharge Summary  Eric [name] WNU:[id number] DOB: 06-19-1923 DOA: 08/26/2017  PCP: [name], MD  Admit date: 08/26/2017 Discharge date: 08/29/2017  Admitted From: Home Disposition:  Home  Discharge Condition:Stable CODE STATUS:FULL Diet recommendation: Heart Healthy  Brief/Interim Summary:  Patient is a 84 year old male with past medical history of dementia, hypertension, hyperlipidemia, CKD stage III, diabetes, seizure, diastolic CHF, TIA who presents from home to the emergency department with complaints of a staring episode, shortness of breath and cough.  Patient was just discharged from skilled nursing facility back to home with home health.  Patient was suspected to have seizure and neurology was consulted.  He follows with neurology as an outpatient for seizure disorder.  He was recently admitted here for the management of UTI.  Chest x-ray in the emergency department showed pulmonary infiltrates likely secondary to CHF and patient also had elevated troponin.  Cardiology consulted. Patient's hospital course remained stable.  He was started on IV Lasix but need to be discontinued later because of worsening kidney function.  His echocardiogram did not show any significant findings from before.  Cardiology signed off.  Neurology was also for possible seizure disorder consulted for possible seizure event .Neurology recommended to continue dilantin at his home dose. Patient was initially recommended to go to skilled nursing facility by PT but her sister wants to take him home with home health.  Following problems were addressed during his hospitalization:  Altered mental status: Patient found to have an episode of staring.  Unable to provide history on presentation.  Currently his mental status baseline.  CT head did not show any acute intracranial abnormalities..  Seizure disorder:Neurology was consulted for suspicion of seizure episode.  He has history of seizure  disorder.Dilantin level found to be low.  He was loaded with Dilantin.Resumed dilantin at home dose.EEG did not show clear epileptic activities.  Follow-up with neurology as an outpatient.  Diastolic CHF: IV Lasix held due to worsening kidney function..  Patient has lower extremity edema but looks like its more soft tissue than fluid.  Mildly elevated BNP.  Lungs were clear to auscultation today. Can resume Lasix 20 mg daily at home.  Chronic respiratory insufficiency: Most likely secondary to CHF.  X-ray on admission showed bilateral infiltrates most likely secondary to pulmonary edema.  He qualified for home oxygen.  Bradycardia: Currently normal sinus rhythm.  EKG had shown intermittent episodes of type II heart block.  Toprol stopped.  Elevated troponin: Most likely secondary to CHF,CKD.  Does not complain of chest pain.  Cardiology was following.  Echocardiogram showed mild left ventricular hypertrophy, ejection fraction of 55 to 03%, grade 1 diastolic dysfunction.  Anemia: Currently stable.  CKD stage III: Kidney function baseline. Baseline creatinine around 2.  Follow-up with nephrology as an outpatient.  Hypertension: Home medications restarted. Toprol stopped for bradycardia.  Increased the dose of clonidine to 0.2 mg twice a day.  Patient had elevated blood pressure on presentation.  Deconditioning/advanced age/failure to thrive: PT/OT evaluation done.  Recommended skilled nursing facility on discharge but he is going to home with home health as per  his family .  Hyperlipidemia: Continue home medications.    MGUS: Stable.Follow up with hematology as an outpatient if needed.  Diabetes type 2: Continue home insulin.   Discharge Diagnoses:  Active Problems:   Hyperlipidemia   ANEMIA-NOS   Essential hypertension   CKD (chronic kidney disease) stage 3, GFR 30-59 ml/min (HCC)   Acute metabolic encephalopathy   MGUS (monoclonal  gammopathy of unknown significance)    Failure to thrive in adult   Hypertensive encephalopathy   Seizure (HCC)   DM2 (diabetes mellitus, type 2) (HCC)   Acute diastolic CHF (congestive heart failure) (HCC)   Staring episodes   Diastolic CHF (HCC)   Elevated troponin   AV block, Mobitz 2   Pressure injury of skin    Discharge Instructions  Discharge Instructions    Diet - low sodium heart healthy   Complete by:  As directed    Discharge instructions   Complete by:  As directed    1) Please follow-up with your PCP in a week.  Do a CBC and BMP test during the follow-up visit. 2) Follow up with nephrology as an outpatient in 2 weeks.  Name and number of the provider has been attached. 3) Take prescribed medications as instructed.   Increase activity slowly   Complete by:  As directed      Allergies as of 08/29/2017      Reactions   Percocet [oxycodone-acetaminophen] Rash      Medication List    TAKE these medications   ACCU-CHEK AVIVA PLUS test strip Generic drug:  glucose blood   acetaminophen 500 MG tablet Commonly known as:  TYLENOL Take 500 mg by mouth 2 (two) times daily. Reported on 06/19/2015   amLODipine 10 MG tablet Commonly known as:  NORVASC Take 10 mg by mouth every evening.   B-D UF III MINI PEN NEEDLES 31G X 5 MM Misc Generic drug:  Insulin Pen Needle   calamine lotion Apply 1 application topically 2 (two) times daily as needed for itching (to shoulders/ back).   calcitRIOL 0.25 MCG capsule Commonly known as:  ROCALTROL Take 0.25 mcg by mouth daily.   cholecalciferol 1000 units tablet Commonly known as:  VITAMIN D Take 2,000 Units by mouth daily.   cloNIDine 0.2 MG tablet Commonly known as:  CATAPRES Take 1 tablet (0.2 mg total) by mouth 2 (two) times daily. What changed:    medication strength  how much to take   diclofenac sodium 1 % Gel Commonly known as:  VOLTAREN Apply 2 g topically 4 (four) times daily.   donepezil 10 MG tablet Commonly known as:  ARICEPT Take 1  tablet (10 mg total) by mouth at bedtime.   fluticasone 50 MCG/ACT nasal spray Commonly known as:  FLONASE Place 1 spray into both nostrils 2 (two) times daily. What changed:    when to take this  reasons to take this   furosemide 20 MG tablet Commonly known as:  LASIX Take 20 mg by mouth daily.   guaiFENesin 600 MG 12 hr tablet Commonly known as:  MUCINEX Take 2 tablets (1,200 mg total) by mouth 2 (two) times daily.   hydrALAZINE 25 MG tablet Commonly known as:  APRESOLINE Take 75 mg by mouth 3 (three) times daily.   hydrOXYzine 10 MG tablet Commonly known as:  ATARAX/VISTARIL TAKE 1 TABLET BY MOUTH TWICE DAILY AS NEEDED FOR ITCHING   LANTUS SOLOSTAR 100 UNIT/ML Solostar Pen Generic drug:  Insulin Glargine Inject 25 Units into the skin every morning.   latanoprost 0.005 % ophthalmic solution Commonly known as:  XALATAN Place 1 drop into both eyes at bedtime.   loratadine 10 MG tablet Commonly known as:  CLARITIN Take 10 mg by mouth daily with lunch.   LUMIGAN 0.01 % Soln Generic drug:  bimatoprost Place 1 drop into both eyes at bedtime.   metoprolol succinate 50 MG  24 hr tablet Commonly known as:  TOPROL-XL Take 50 mg by mouth daily.   NOVOLOG FLEXPEN 100 UNIT/ML FlexPen Generic drug:  insulin aspart Inject 0-5 Units into the skin 3 (three) times daily as needed for high blood sugar (sliding scale).   OVER THE COUNTER MEDICATION Apply 1 application topically 4 (four) times daily as needed (clotrimazole= to the buttocks for rash as needed).   pantoprazole 40 MG tablet Commonly known as:  PROTONIX Take 40 mg by mouth daily.   phenytoin 100 MG ER capsule Commonly known as:  DILANTIN Take 200 mg by mouth daily.   polyethylene glycol powder powder Commonly known as:  GLYCOLAX/MIRALAX Take 17 g by mouth daily as needed for mild constipation.   PROBIOTIC-10 PO Take 1 capsule by mouth daily.   senna 8.6 MG Tabs tablet Commonly known as:  SENOKOT Take 1  tablet by mouth daily.   SYSTANE ULTRA 0.4-0.3 % Soln Generic drug:  Polyethyl Glycol-Propyl Glycol Place 1 drop into both eyes 3 (three) times daily as needed (dry eyes).      Follow-up Information    [name], MD. Schedule an appointment as soon as possible for a visit in 1 week(s).   Specialty:  Internal Medicine Contact information: [place] [address] [phone]        Rosita Fire, MD. Schedule an appointment as soon as possible for a visit in 2 week(s).   Specialties:  Nephrology, Internal Medicine Contact information: [address] [place] [address] [phone]          Allergies  Allergen Reactions  . Percocet [Oxycodone-Acetaminophen] Rash    Consultations: Cardiology  Procedures/Studies: Dg Chest 2 View  Result Date: 08/26/2017 CLINICAL DATA:  84 year old male with cough and altered mental status. EXAM: CHEST - 2 VIEW COMPARISON:  Chest radiographs 08/02/2017 and earlier. FINDINGS: Diffuse bilateral pulmonary interstitial opacity with confluent perihilar and bilateral lung base opacity. Evidence of small layering pleural effusions on the lateral view. Visible mediastinal contours are stable. No pneumothorax. Visualized tracheal air column is within normal limits. No acute osseous abnormality identified. Calcified aortic atherosclerosis. Negative visible bowel gas pattern. IMPRESSION: 1. Acute bilateral pulmonary opacity most suggestive of acute pulmonary edema with small pleural effusions and bibasilar atelectasis. Bilateral pneumonia is less likely. 2.  Aortic Atherosclerosis (ICD10-I70.0). Electronically Signed   By: Genevie Ann M.D.   On: 08/26/2017 16:15   Ct Head Wo Contrast  Result Date: 08/26/2017 CLINICAL DATA:  Acute onset of staring and unresponsiveness. History of dementia. EXAM: CT HEAD WITHOUT CONTRAST TECHNIQUE: Contiguous axial images were obtained from the base of the skull through the vertex without intravenous  contrast. COMPARISON:  08/02/2017. FINDINGS: Brain: No evidence for acute infarction, hemorrhage, mass lesion, or extra-axial fluid. Generalized atrophy. Hydrocephalus ex vacuo. Chronic microvascular ischemic change likely accounts for hypoattenuation of the white matter. Vascular: Calcification of the cavernous internal carotid arteries consistent with cerebrovascular atherosclerotic disease. No signs of intracranial large vessel occlusion. Skull: The calvarium is intact. Incidental dural calcification. Hypertrophic pannus surrounds the odontoid. Sinuses/Orbits: No layering sinus fluid.  Negative orbits. Other: None. Similar appearance to priors. IMPRESSION: Advanced atrophy.  No acute intracranial findings. Electronically Signed   By: Staci Righter M.D.   On: 08/26/2017 14:55   Ct Head Wo Contrast  Result Date: 08/02/2017 CLINICAL DATA:  83 year old male with a history of altered mental status EXAM: CT HEAD WITHOUT CONTRAST TECHNIQUE: Contiguous axial images were obtained from the base of the skull  through the vertex without intravenous contrast. COMPARISON:  06/29/2017, 06/14/2017 FINDINGS: Brain: No acute intracranial hemorrhage. No midline shift or mass effect. Gray-white differentiation relatively maintained. Unremarkable configuration the ventricles. Mild volume loss. Vascular: Calcifications of the intracranial vasculature. Skull: No acute fracture. Sinuses/Orbits: Unremarkable appearance of the orbits. Other: None IMPRESSION: Head CT negative for acute intracranial abnormality. Similar appearance of volume loss. Electronically Signed   By: Corrie Mckusick D.O.   On: 08/02/2017 20:29   Dg Chest Port 1 View  Result Date: 08/02/2017 CLINICAL DATA:  84 y/o  M; altered level of consciousness. EXAM: PORTABLE CHEST 1 VIEW COMPARISON:  06/29/2017 chest radiograph FINDINGS: Cardiac silhouette accentuated by low lung volumes and apical lordotic technique. Aortic atherosclerosis with calcification. Clear lungs.  No pleural effusion or pneumothorax. No acute osseous abnormality is evident. IMPRESSION: No active disease. Electronically Signed   By: Kristine Garbe M.D.   On: 08/02/2017 20:15       Subjective: Patient seen and examined at bedside this morning.  Remains comfortable.  Apparently his mental status is at baseline. Stable for discharge to home today.  Discharge Exam: Vitals:   08/29/17 0430 08/29/17 0700  BP: (!) 150/59 (!) 177/87  Pulse: 88 97  Resp: 19 18  Temp: 97.9 F (36.6 C) (!) 97.3 F (36.3 C)  SpO2: 100% 98%   Vitals:   08/28/17 1915 08/28/17 2345 08/29/17 0430 08/29/17 0700  BP: (!) 153/64 (!) 151/70 (!) 150/59 (!) 177/87  Pulse: 92 84 88 97  Resp: 19 13 19 18   Temp: 97.9 F (36.6 C) 97.9 F (36.6 C) 97.9 F (36.6 C) (!) 97.3 F (36.3 C)  TempSrc: Oral Oral Oral Oral  SpO2: 94% 100% 100% 98%  Weight:      Height:        General: Pt is alert, awake, not in acute distress Cardiovascular: RRR, S1/S2 +, no rubs, no gallops Respiratory: CTA bilaterally, no wheezing, no rhonchi Abdominal: Soft, NT, ND, bowel sounds + Extremities: Lower extremity edema, no cyanosis    The results of significant diagnostics from this hospitalization (including imaging, microbiology, ancillary and laboratory) are listed below for reference.     Microbiology: Recent Results (from the past 240 hour(s))  MRSA PCR Screening     Status: Abnormal   Collection Time: 08/27/17 12:28 AM  Result Value Ref Range Status   MRSA by PCR POSITIVE (A) NEGATIVE Final    Comment:        The GeneXpert MRSA Assay (FDA approved for NASAL specimens only), is one component of a comprehensive MRSA colonization surveillance program. It is not intended to diagnose MRSA infection nor to guide or monitor treatment for MRSA infections. RESULT CALLED TO, READ BACK BY AND VERIFIED WITH: Ellis Parents [phone] 08/27/2017 T. TYSOR      Labs: BNP (last 3 results) Recent Labs     06/29/17 1329 08/28/17 0554  BNP 256.0* 101.7*   Basic Metabolic Panel: Recent Labs  Lab 08/26/17 1453 08/27/17 0630 08/28/17 0554 08/29/17 0225  NA 137 139 135 138  K 3.7 3.6 4.1 3.7  CL 107 108 104 104  CO2 21* 22 19* 25  GLUCOSE 144* 142* 143* 123*  BUN 29* 25* 33* 31*  CREATININE 2.24* 2.00* 2.36* 2.17*  CALCIUM 8.7* 8.8* 9.0 9.1  MG  --  1.6*  --   --   PHOS  --  3.7  --   --    Liver Function Tests: Recent Labs  Lab 08/26/17 1453  08/27/17 0630  AST 25 23  ALT 26 24  ALKPHOS 123 113  BILITOT 0.6 0.4  PROT 6.6 6.2*  ALBUMIN 2.7* 2.6*   No results for input(s): LIPASE, AMYLASE in the last 168 hours. No results for input(s): AMMONIA in the last 168 hours. CBC: Recent Labs  Lab 08/26/17 1453 08/27/17 0630  WBC 5.1 5.6  NEUTROABS 3.4  --   HGB 9.9* 9.4*  HCT 30.3* 28.9*  MCV 101.0* 100.7*  PLT 327 314   Cardiac Enzymes: Recent Labs  Lab 08/26/17 1927 08/27/17 0043 08/27/17 0630  TROPONINI 0.10* 0.10* 0.09*   BNP: Invalid input(s): POCBNP CBG: Recent Labs  Lab 08/28/17 1633 08/28/17 2054 08/28/17 2344 08/29/17 0430 08/29/17 0757  GLUCAP 205* 97 148* 120* 148*   D-Dimer No results for input(s): DDIMER in the last 72 hours. Hgb A1c Recent Labs    08/27/17 0043  HGBA1C 6.0*   Lipid Profile No results for input(s): CHOL, HDL, LDLCALC, TRIG, CHOLHDL, LDLDIRECT in the last 72 hours. Thyroid function studies Recent Labs    08/27/17 0043  TSH 3.008   Anemia work up Recent Labs    08/26/17 1927  VITAMINB12 276  FOLATE 9.2  FERRITIN 173  TIBC 192*  IRON 42*  RETICCTPCT 3.0   Urinalysis    Component Value Date/Time   COLORURINE COLORLESS (A) 08/26/2017 2020   APPEARANCEUR CLEAR 08/26/2017 2020   LABSPEC 1.004 (L) 08/26/2017 2020   PHURINE 7.0 08/26/2017 2020   GLUCOSEU NEGATIVE 08/26/2017 2020   GLUCOSEU 500 03/30/2011 1055   HGBUR SMALL (A) 08/26/2017 2020   HGBUR moderate 10/03/2008 1349   [place] 08/26/2017  2020   KETONESUR NEGATIVE 08/26/2017 2020   PROTEINUR 30 (A) 08/26/2017 2020   UROBILINOGEN 0.2 11/09/2014 1717   NITRITE NEGATIVE 08/26/2017 2020   LEUKOCYTESUR NEGATIVE 08/26/2017 2020   Sepsis Labs Invalid input(s): PROCALCITONIN,  WBC,  LACTICIDVEN Microbiology Recent Results (from the past 240 hour(s))  MRSA PCR Screening     Status: Abnormal   Collection Time: 08/27/17 12:28 AM  Result Value Ref Range Status   MRSA by PCR POSITIVE (A) NEGATIVE Final    Comment:        The GeneXpert MRSA Assay (FDA approved for NASAL specimens only), is one component of a comprehensive MRSA colonization surveillance program. It is not intended to diagnose MRSA infection nor to guide or monitor treatment for MRSA infections. RESULT CALLED TO, READ BACK BY AND VERIFIED WITH: Ellis Parents [phone] 08/27/2017 T. TYSOR      Time coordinating discharge: 35 minutes  SIGNED:   Shelly Coss, MD  [facility] 08/29/2017, 10:29 AM Pager [phone]  If 7PM-7AM, please contact night-coverage [email or web address] Password [email or web address]

## 2017-09-20 ENCOUNTER — Ambulatory Visit: Payer: Medicare Other | Admitting: Neurology

## 2017-09-20 ENCOUNTER — Encounter

## 2017-09-20 ENCOUNTER — Telehealth: Payer: Self-pay

## 2017-09-20 DIAGNOSIS — R569 Unspecified convulsions: Secondary | ICD-10-CM | POA: Diagnosis not present

## 2017-09-20 DIAGNOSIS — G4719 Other hypersomnia: Secondary | ICD-10-CM

## 2017-09-20 NOTE — Telephone Encounter (Signed)
-----   Message from Garvin Fila, MD sent at 09/20/2017  5:33 PM EDT ----- Eric [name] inform patient that EEG study was normal

## 2017-09-20 NOTE — Telephone Encounter (Signed)
Notes recorded by Marval Regal, RN on 09/20/2017 at 5:53 PM EDT RN spoke with Lelon Frohlich pts sister on dpr. Rn stated EEG was normal. PT's sister verbalized understanding. ------

## 2017-09-29 ENCOUNTER — Encounter (HOSPITAL_COMMUNITY): Payer: Self-pay

## 2017-09-29 ENCOUNTER — Observation Stay (HOSPITAL_COMMUNITY)
Admission: EM | Admit: 2017-09-29 | Discharge: 2017-10-03 | Disposition: A | Discharge status: Home Health | Payer: Medicare Other | Attending: Internal Medicine | Admitting: Internal Medicine

## 2017-09-29 ENCOUNTER — Emergency Department (HOSPITAL_COMMUNITY): Payer: Medicare Other

## 2017-09-29 ENCOUNTER — Other Ambulatory Visit: Payer: Self-pay

## 2017-09-29 DIAGNOSIS — R569 Unspecified convulsions: Secondary | ICD-10-CM

## 2017-09-29 DIAGNOSIS — R2681 Unsteadiness on feet: Secondary | ICD-10-CM | POA: Insufficient documentation

## 2017-09-29 DIAGNOSIS — G40909 Epilepsy, unspecified, not intractable, without status epilepticus: Secondary | ICD-10-CM | POA: Insufficient documentation

## 2017-09-29 DIAGNOSIS — I5033 Acute on chronic diastolic (congestive) heart failure: Secondary | ICD-10-CM | POA: Diagnosis not present

## 2017-09-29 DIAGNOSIS — E1122 Type 2 diabetes mellitus with diabetic chronic kidney disease: Secondary | ICD-10-CM | POA: Insufficient documentation

## 2017-09-29 DIAGNOSIS — N400 Enlarged prostate without lower urinary tract symptoms: Secondary | ICD-10-CM | POA: Insufficient documentation

## 2017-09-29 DIAGNOSIS — I130 Hypertensive heart and chronic kidney disease with heart failure and stage 1 through stage 4 chronic kidney disease, or unspecified chronic kidney disease: Secondary | ICD-10-CM | POA: Insufficient documentation

## 2017-09-29 DIAGNOSIS — N390 Urinary tract infection, site not specified: Secondary | ICD-10-CM | POA: Diagnosis not present

## 2017-09-29 DIAGNOSIS — D631 Anemia in chronic kidney disease: Secondary | ICD-10-CM | POA: Insufficient documentation

## 2017-09-29 DIAGNOSIS — D472 Monoclonal gammopathy: Secondary | ICD-10-CM | POA: Diagnosis not present

## 2017-09-29 DIAGNOSIS — K3184 Gastroparesis: Secondary | ICD-10-CM | POA: Diagnosis not present

## 2017-09-29 DIAGNOSIS — I441 Atrioventricular block, second degree: Secondary | ICD-10-CM | POA: Diagnosis not present

## 2017-09-29 DIAGNOSIS — M1990 Unspecified osteoarthritis, unspecified site: Secondary | ICD-10-CM | POA: Insufficient documentation

## 2017-09-29 DIAGNOSIS — E869 Volume depletion, unspecified: Secondary | ICD-10-CM | POA: Insufficient documentation

## 2017-09-29 DIAGNOSIS — E1143 Type 2 diabetes mellitus with diabetic autonomic (poly)neuropathy: Secondary | ICD-10-CM | POA: Insufficient documentation

## 2017-09-29 DIAGNOSIS — N184 Chronic kidney disease, stage 4 (severe): Secondary | ICD-10-CM | POA: Insufficient documentation

## 2017-09-29 DIAGNOSIS — B964 Proteus (mirabilis) (morganii) as the cause of diseases classified elsewhere: Secondary | ICD-10-CM | POA: Insufficient documentation

## 2017-09-29 DIAGNOSIS — I2510 Atherosclerotic heart disease of native coronary artery without angina pectoris: Secondary | ICD-10-CM | POA: Diagnosis not present

## 2017-09-29 DIAGNOSIS — N183 Chronic kidney disease, stage 3 unspecified: Secondary | ICD-10-CM | POA: Diagnosis present

## 2017-09-29 DIAGNOSIS — E11319 Type 2 diabetes mellitus with unspecified diabetic retinopathy without macular edema: Secondary | ICD-10-CM | POA: Insufficient documentation

## 2017-09-29 DIAGNOSIS — Z7951 Long term (current) use of inhaled steroids: Secondary | ICD-10-CM | POA: Insufficient documentation

## 2017-09-29 DIAGNOSIS — R55 Syncope and collapse: Secondary | ICD-10-CM | POA: Diagnosis present

## 2017-09-29 DIAGNOSIS — Z880 Allergy status to penicillin: Secondary | ICD-10-CM | POA: Insufficient documentation

## 2017-09-29 DIAGNOSIS — N179 Acute kidney failure, unspecified: Secondary | ICD-10-CM | POA: Insufficient documentation

## 2017-09-29 DIAGNOSIS — F419 Anxiety disorder, unspecified: Secondary | ICD-10-CM | POA: Diagnosis not present

## 2017-09-29 DIAGNOSIS — R001 Bradycardia, unspecified: Secondary | ICD-10-CM | POA: Diagnosis not present

## 2017-09-29 DIAGNOSIS — K219 Gastro-esophageal reflux disease without esophagitis: Secondary | ICD-10-CM | POA: Insufficient documentation

## 2017-09-29 DIAGNOSIS — I700 Atherosclerosis of aorta: Secondary | ICD-10-CM | POA: Insufficient documentation

## 2017-09-29 DIAGNOSIS — E538 Deficiency of other specified B group vitamins: Secondary | ICD-10-CM | POA: Insufficient documentation

## 2017-09-29 DIAGNOSIS — M8580 Other specified disorders of bone density and structure, unspecified site: Secondary | ICD-10-CM | POA: Insufficient documentation

## 2017-09-29 DIAGNOSIS — Z885 Allergy status to narcotic agent status: Secondary | ICD-10-CM | POA: Insufficient documentation

## 2017-09-29 DIAGNOSIS — F0390 Unspecified dementia without behavioral disturbance: Secondary | ICD-10-CM | POA: Insufficient documentation

## 2017-09-29 DIAGNOSIS — Z8673 Personal history of transient ischemic attack (TIA), and cerebral infarction without residual deficits: Secondary | ICD-10-CM | POA: Insufficient documentation

## 2017-09-29 DIAGNOSIS — E119 Type 2 diabetes mellitus without complications: Secondary | ICD-10-CM

## 2017-09-29 DIAGNOSIS — Z794 Long term (current) use of insulin: Secondary | ICD-10-CM | POA: Insufficient documentation

## 2017-09-29 DIAGNOSIS — I10 Essential (primary) hypertension: Secondary | ICD-10-CM | POA: Diagnosis present

## 2017-09-29 DIAGNOSIS — E785 Hyperlipidemia, unspecified: Secondary | ICD-10-CM | POA: Diagnosis not present

## 2017-09-29 DIAGNOSIS — I5030 Unspecified diastolic (congestive) heart failure: Secondary | ICD-10-CM | POA: Diagnosis present

## 2017-09-29 DIAGNOSIS — F329 Major depressive disorder, single episode, unspecified: Secondary | ICD-10-CM | POA: Insufficient documentation

## 2017-09-29 LAB — CBC WITH DIFFERENTIAL/PLATELET
Abs Immature Granulocytes: 0 10*3/uL (ref 0.0–0.1)
BASOS PCT: 0 %
Basophils Absolute: 0 10*3/uL (ref 0.0–0.1)
EOS ABS: 0.2 10*3/uL (ref 0.0–0.7)
EOS PCT: 3 %
HCT: 32.8 % — ABNORMAL LOW (ref 39.0–52.0)
Hemoglobin: 10.7 g/dL — ABNORMAL LOW (ref 13.0–17.0)
Immature Granulocytes: 0 %
Lymphocytes Relative: 22 %
Lymphs Abs: 1.2 10*3/uL (ref 0.7–4.0)
MCH: 33 pg (ref 26.0–34.0)
MCHC: 32.6 g/dL (ref 30.0–36.0)
MCV: 101.2 fL — AB (ref 78.0–100.0)
MONO ABS: 0.2 10*3/uL (ref 0.1–1.0)
MONOS PCT: 3 %
Neutro Abs: 3.9 10*3/uL (ref 1.7–7.7)
Neutrophils Relative %: 72 %
PLATELETS: 270 10*3/uL (ref 150–400)
RBC: 3.24 MIL/uL — ABNORMAL LOW (ref 4.22–5.81)
RDW: 12.2 % (ref 11.5–15.5)
WBC: 5.5 10*3/uL (ref 4.0–10.5)

## 2017-09-29 LAB — COMPREHENSIVE METABOLIC PANEL
ALT: 18 U/L (ref 17–63)
AST: 20 U/L (ref 15–41)
Albumin: 3.1 g/dL — ABNORMAL LOW (ref 3.5–5.0)
Alkaline Phosphatase: 133 U/L — ABNORMAL HIGH (ref 38–126)
Anion gap: 12 (ref 5–15)
BILIRUBIN TOTAL: 0.3 mg/dL (ref 0.3–1.2)
BUN: 66 mg/dL — ABNORMAL HIGH (ref 6–20)
CHLORIDE: 103 mmol/L (ref 101–111)
CO2: 21 mmol/L — ABNORMAL LOW (ref 22–32)
CREATININE: 2.93 mg/dL — AB (ref 0.61–1.24)
Calcium: 9.9 mg/dL (ref 8.9–10.3)
GFR, EST AFRICAN AMERICAN: 20 mL/min — AB (ref >60)
GFR, EST NON AFRICAN AMERICAN: 17 mL/min — AB (ref >60)
Glucose, Bld: 174 mg/dL — ABNORMAL HIGH (ref 65–99)
POTASSIUM: 4.4 mmol/L (ref 3.5–5.1)
Sodium: 136 mmol/L (ref 135–145)
TOTAL PROTEIN: 7.4 g/dL (ref 6.5–8.1)

## 2017-09-29 LAB — I-STAT TROPONIN, ED: TROPONIN I, POC: 0.07 ng/mL (ref 0.00–0.08)

## 2017-09-29 MED ORDER — SODIUM CHLORIDE 0.9 % IV BOLUS
1000.0000 mL | Freq: Once | INTRAVENOUS | Status: AC
  Administered 2017-09-29: 1000 mL via INTRAVENOUS

## 2017-09-29 MED ORDER — ASPIRIN 81 MG PO CHEW
324.0000 mg | CHEWABLE_TABLET | Freq: Once | ORAL | Status: AC
  Administered 2017-09-29: 324 mg via ORAL
  Filled 2017-09-29: qty 4

## 2017-09-29 NOTE — ED Triage Notes (Signed)
Pt here via [facility] with c/o sudden onset of nausea and vomiting. Caregiver states that he did have a syncopal episode that lasted a few minutes. EMS reports that pt is having increasing periods of sinus arrest with some st depression in 2,3 and AVF. Zofran given for nausea. EMS reports lethargic at times. Pt alert and orient at this time. Only c/o nausea upon arrival. Denies chest pain.Here with caregiver. Famiy on the way to ED.

## 2017-09-29 NOTE — ED Provider Notes (Signed)
[facility] EMERGENCY DEPARTMENT Provider Note   CSN: [id number] Arrival date & time: 09/29/17  2145   History   Chief Complaint Chief Complaint  Patient presents with  . Nausea  . Emesis  . Irregular Heart Beat  . Loss of Consciousness    HPI Eric [name] is a 84 y.o. male.  The history is provided by the patient.   28 M with PMHx of CHF, CAD, recent inpatient admission for UTI, PNA who presents after sudden onset syncope this evening. Was sitting at home eating and suddenly became diaphoretic and lost consciousness for a few minutes. No reported seizure like activity per witness (caregiver). After regaining consciousness (no intervention), patient's only complaint is of nausea. No symptoms prior to event. Pt denies headache, chest pain, abd pain, dyspnea. Per EMS, EKGs obtained with episodes of sinus arrests, period of time where patient appeared to be in second degree type 1 AV block. No syncopal events with EMS.     Past Medical History:  Diagnosis Date  . Adult failure to thrive   . Altered mental status   . ANEMIA-NOS 02/17/2007  . Anxiety   . BACK PAIN, LUMBAR   . BENIGN PROSTATIC HYPERTROPHY   . Bilateral leg edema   . CAD (coronary artery disease)    a. nonobstructive CAD by cath in 2013  . CAP (community acquired pneumonia)   . CKD (chronic kidney disease)   . DEPRESSION 12/25/2008  . Diabetes mellitus   . DIABETES MELLITUS, TYPE I, CONTROLLED, WITH RETINOPATHY   . Encephalopathy, metabolic   . Gastroparesis   . GERD 02/17/2007  . Hyperlipidemia   . HYPERTENSION 02/17/2007  . Hyponatremia   . LIVER DISORDER   . OSTEOARTHRITIS 03/04/2010  . Pneumonia   . PSA, INCREASED   . Stroke ([place])   . UTI (urinary tract infection) 07/2017  . VITAMIN B12 DEFICIENCY     Patient Active Problem List   Diagnosis Date Noted  . Pressure injury of skin 08/28/2017  . AV block, [place] 2   . Acute diastolic CHF (congestive heart failure) ([place])  08/26/2017  . Staring episodes 08/26/2017  . Diastolic CHF ([place]) 57/32/2025  . Elevated troponin 08/26/2017  . HCAP (healthcare-associated pneumonia)   . Severe sepsis ([place])   . Sepsis due to pneumonia ([place]) 10/30/2016  . History of DVT (deep vein thrombosis) 10/30/2016  . UTI (urinary tract infection) 10/03/2016  . Memory loss 06/22/2016  . AKI (acute kidney injury) ([place]) 11/12/2014  . TIA (transient ischemic attack) 11/09/2014  . Seizure ([place]) 02/08/2012  . Lesion of lung 02/08/2012  . DM2 (diabetes mellitus, type 2) ([place]) 02/08/2012  . Hypertensive encephalopathy 01/06/2012  . Hyperglycemia 01/06/2012  . MGUS (monoclonal gammopathy of unknown significance) 10/28/2011  . Bilateral leg edema 10/28/2011  . Fall at home 10/28/2011  . Failure to thrive in adult 10/28/2011  . Type 2 diabetes mellitus with hypoglycemia ([place]) 10/27/2011  . Dysphagia 10/27/2011  . Encephalopathy 10/27/2011  . Hypernatremia 10/24/2011  . Altered mental status 10/20/2011  . Generalized weakness 10/18/2011  . Acute metabolic encephalopathy   . Dependent edema 10/02/2011  . Hyponatremia 10/02/2011  . Pneumonia 04/24/2011  . CKD (chronic kidney disease) stage 3, GFR 30-59 ml/min ([place]) 04/19/2011  . Bronchitis, acute 04/16/2011  . Edema 03/30/2011  . OSTEOARTHRITIS 03/04/2010  . LIVER DISORDER 12/09/2009  . BACK PAIN, LUMBAR 09/09/2009  . ABDOMINAL PAIN, UNSPECIFIED SITE 01/31/2009  . SYNCOPE 01/08/2009  . ELBOW PAIN, LEFT 12/30/2008  .  ABNORMAL ELECTROCARDIOGRAM 12/30/2008  . DEPRESSION 12/25/2008  . WEIGHT LOSS 12/25/2008  . NOSEBLEED 12/25/2008  . VITAMIN B12 DEFICIENCY 10/24/2008  . DM (diabetes mellitus), type 1, uncontrolled w/ophthalmic complication ([place]) 19/50/9326  . PANCYTOPENIA 10/08/2008  . PSA, INCREASED 10/08/2008  . DYSURIA 10/03/2008  . LEG PAIN, BILATERAL 12/26/2007  . KNEE PAIN, LEFT 08/18/2007  . COUGH 04/18/2007  . Hyperlipidemia 02/17/2007  . ANEMIA-NOS 02/17/2007  .  Essential hypertension 02/17/2007  . ALLERGIC RHINITIS 02/17/2007  . GERD 02/17/2007  . GASTROPARESIS 02/17/2007  . Benign prostatic hyperplasia 02/17/2007  . HYPERURICEMIA 02/17/2007    Past Surgical History:  Procedure Laterality Date  . LEFT HEART CATHETERIZATION WITH CORONARY ANGIOGRAM N/A 04/23/2011   Procedure: LEFT HEART CATHETERIZATION WITH CORONARY ANGIOGRAM;  Surgeon: Peter M Martinique, MD;  Location: [facility] CATH LAB;  Service: Cardiovascular;  Laterality: N/A;  . TRANSURETHRAL RESECTION OF PROSTATE          Home Medications    Prior to Admission medications   Medication Sig Start Date End Date Taking? Authorizing Provider  acetaminophen (TYLENOL) 500 MG tablet Take 500 mg by mouth 2 (two) times daily. Reported on 06/19/2015   Yes [provider]  amLODipine (NORVASC) 10 MG tablet Take 10 mg by mouth every evening.  05/26/16  Yes [provider]  calamine lotion Apply 1 application topically 2 (two) times daily as needed for itching (to shoulders/ back).   Yes [provider]  calcitRIOL (ROCALTROL) 0.25 MCG capsule Take 0.25 mcg by mouth daily.  12/26/11  Yes [provider]  cholecalciferol (VITAMIN D) 1000 UNITS tablet Take 2,000 Units by mouth daily.    Yes [provider]  cloNIDine (CATAPRES) 0.2 MG tablet Take 1 tablet (0.2 mg total) by mouth 2 (two) times daily. 08/29/17  Yes Adhikari, Tamsen Meek, MD  donepezil (ARICEPT) 10 MG tablet Take 1 tablet (10 mg total) by mouth at bedtime. 06/22/16  Yes Dennie Bible, NP  fluticasone [facility]) 50 MCG/ACT nasal spray Place 1 spray into both nostrils 2 (two) times daily. Patient taking differently: Place 1 spray into both nostrils 2 (two) times daily as needed for allergies.  10/04/16  Yes Patrecia Pour, Christean Grief, MD  guaiFENesin (MUCINEX) 600 MG 12 hr tablet Take 2 tablets (1,200 mg total) by mouth 2 (two) times daily. 08/29/17  Yes Shelly Coss, MD  hydrALAZINE (APRESOLINE) 25 MG tablet Take 75 mg  by mouth 3 (three) times daily.  10/22/14  Yes [provider]  Insulin Glargine (LANTUS SOLOSTAR) 100 UNIT/ML Solostar Pen Inject 22 Units into the skin daily at 10 pm.    Yes [provider]  insulin lispro (HUMALOG) 100 UNIT/ML injection Inject 0-5 Units into the skin 3 (three) times daily before meals. Sliding scale   Yes [provider]  latanoprost (XALATAN) 0.005 % ophthalmic solution Place 1 drop into both eyes at bedtime. 06/11/17  Yes [provider]  lisinopril (PRINIVIL,ZESTRIL) 20 MG tablet Take 20 mg by mouth daily. 09/01/17  Yes [provider]  loratadine (CLARITIN) 10 MG tablet Take 10 mg by mouth daily with lunch.   Yes [provider]  metoprolol succinate (TOPROL-XL) 50 MG 24 hr tablet Take 50 mg by mouth daily. 09/29/17  Yes [provider]  OVER THE COUNTER MEDICATION Apply 1 application topically 4 (four) times daily as needed (clotrimazole= to the buttocks for rash as needed).    Yes [provider]  pantoprazole (PROTONIX) 40 MG tablet Take 40 mg by mouth  daily.  05/04/15  Yes [provider]  phenytoin (DILANTIN) 100 MG ER capsule Take 200 mg by mouth daily.   Yes [provider]  Polyethyl Glycol-Propyl Glycol (SYSTANE ULTRA) 0.4-0.3 % SOLN Place 1 drop into both eyes 3 (three) times daily as needed (dry eyes).   Yes [provider]  polyethylene glycol powder (GLYCOLAX/MIRALAX) powder Take 17 g by mouth daily as needed for mild constipation.  05/18/14  Yes [provider]  Probiotic Product (PROBIOTIC-10 PO) Take 1 capsule by mouth daily.   Yes [provider]  senna (SENOKOT) 8.6 MG TABS Take 1 tablet by mouth daily.   Yes [provider]  torsemide (DEMADEX) 10 MG tablet Take 10 mg by mouth daily. 09/15/17  Yes [provider]  ACCU-CHEK AVIVA PLUS test strip  05/01/14   [provider]  B-D UF III MINI PEN NEEDLES 31G X 5 MM MISC  06/20/14    [provider]  diclofenac sodium (VOLTAREN) 1 % GEL Apply 2 g topically 4 (four) times daily. Patient not taking: Reported on 08/26/2017 03/08/17   Leandrew Koyanagi, MD    Family History Family History  Problem Relation Age of Onset  . Cancer Father        had uncertain type of cancer  . Cancer Brother        Prostate Cancer  . Cancer Brother        Prostate Cancer  . Diabetes Other     Social History Social History   Tobacco Use  . Smoking status: Never Smoker  . Smokeless tobacco: Never Used  Substance Use Topics  . Alcohol use: No  . Drug use: No     Allergies   Penicillins; Hydroxyzine; and Percocet [oxycodone-acetaminophen]   Review of Systems Review of Systems  Constitutional: Negative for chills and fever.  HENT: Negative for ear pain and sore throat.   Eyes: Negative for pain and visual disturbance.  Respiratory: Negative for cough and shortness of breath.   Cardiovascular: Negative for chest pain and palpitations.  Gastrointestinal: Positive for nausea. Negative for abdominal pain and vomiting.  Genitourinary: Negative for dysuria and hematuria.  Musculoskeletal: Negative for arthralgias and back pain.  Skin: Negative for color change and rash.  Neurological: Positive for syncope. Negative for seizures.  All other systems reviewed and are negative.    Physical Exam Updated Vital Signs BP (!) 169/61   Pulse (!) 27   Temp 97.6 F (36.4 C) (Oral)   Resp 10   SpO2 98%   Physical Exam  Constitutional: He appears well-developed and well-nourished.  HENT:  Head: Normocephalic and atraumatic.  Mouth/Throat: Oropharynx is clear and moist.  Eyes: Conjunctivae and EOM are normal.  Neck: Normal range of motion. Neck supple.  Cardiovascular: Normal rate, regular rhythm, normal heart sounds and intact distal pulses.  No murmur heard. Pulmonary/Chest: Effort normal and breath sounds normal. No stridor. No respiratory distress. He has no wheezes.    Abdominal: Soft. He exhibits no distension. There is no tenderness.  Musculoskeletal: He exhibits no edema.  Neurological: He is alert. No cranial nerve deficit or sensory deficit.  Alert and oriented x 2  Skin: Skin is warm and dry.  Psychiatric: He has a normal mood and affect.  Nursing note and vitals reviewed.    ED Treatments / Results  Labs (all labs ordered are listed, but only abnormal results are displayed) Labs Reviewed  CBC WITH DIFFERENTIAL/PLATELET - Abnormal; Notable for the following  components:      Result Value   RBC 3.24 (*)    Hemoglobin 10.7 (*)    HCT 32.8 (*)    MCV 101.2 (*)    All other components within normal limits  COMPREHENSIVE METABOLIC PANEL - Abnormal; Notable for the following components:   CO2 21 (*)    Glucose, Bld 174 (*)    BUN 66 (*)    Creatinine, Ser 2.93 (*)    Albumin 3.1 (*)    Alkaline Phosphatase 133 (*)    GFR calc non Af Amer 17 (*)    GFR calc Af Amer 20 (*)    All other components within normal limits  BRAIN NATRIURETIC PEPTIDE  URINALYSIS, ROUTINE W REFLEX MICROSCOPIC  PHENYTOIN LEVEL, TOTAL  I-STAT TROPONIN, ED    EKG EKG Interpretation  Date/Time:  Thursday September 29 2017 21:52:49 EDT Ventricular Rate:  85 PR Interval:    QRS Duration: 81 QT Interval:  386 QTC Calculation: 459 R Axis:   56 Text Interpretation:  Sinus rhythm Prolonged PR interval Probable left atrial enlargement Borderline ST elevation, anterior leads Confirmed by Lacretia Leigh ([id number]) on 09/29/2017 11:52:48 PM   Radiology Dg Chest Portable 1 View  Result Date: 09/29/2017 CLINICAL DATA:  Acute onset nausea and vomiting, syncopal episode. EXAM: PORTABLE CHEST 1 VIEW COMPARISON:  Chest radiograph Aug 26, 2017 FINDINGS: The cardiac silhouette is mildly enlarged, improved from prior imaging. Calcified aortic knob. Mildly elevated RIGHT hemidiaphragm. Strandy densities LEFT base. No focal consolidation. Biapical thickening. No pneumothorax. Osteopenia.  Symmetric calcification likely vascular. IMPRESSION: Mild cardiomegaly.  LEFT lung base atelectasis/scarring. Aortic Atherosclerosis (ICD10-I70.0). Electronically Signed   By: Elon Alas M.D.   On: 09/29/2017 23:02    Procedures Procedures (including critical care time)  Medications Ordered in ED Medications  aspirin chewable tablet 324 mg (324 mg Oral Given 09/29/17 2215)  sodium chloride 0.9 % bolus 1,000 mL (1,000 mLs Intravenous New Bag/Given 09/29/17 2338)     Initial Impression / Assessment and Plan / ED Course  I have reviewed the triage vital signs and the nursing notes.  Pertinent labs & imaging results that were available during my care of the patient were reviewed by me and considered in my medical decision making (see chart for details).     Eric [name] is a 84 y.o. male with PMHx of  CHF, CAD, recent inpatient admission for UTI, PNA who presents after sudden onset syncope this evening. Reviewed and confirmed nursing documentation for past medical history, family history, social history. VS afebrile, wnl. Exam unremarkable. EKG here appears to be sinus rhythm with prolonged PR intervals. DDx for syncope suspicious for arrhythmia given abnormal EKGs obtained by EMS. Question ACS. Possible seizure. No focal deficits to suggest CVA.  CBC with no leukocytosis, macrocytic anemia with hgb 10.7 (baseline). CMP with BUN 66, Cr 2.93 (AKI on CKD, baseline closer to 2.1). 1L NS bolus given. Trop x 1 neg. CXR with mild cardiomegaly.   Old records reviewed. Labs reviewed by me and used in the medical decision making.  Imaging viewed and interpreted by me and used in the medical decision making (formal interpretation from radiologist). EKG reviewed by me and used in the medical decision making. Admitted to hospitalist.    Final Clinical Impressions(s) / ED Diagnoses   Final diagnoses:  Syncope, unspecified syncope type  AKI (acute kidney injury) [facility])    ED Discharge Orders      None  Norm Salt, MD 09/30/17 2426    Lacretia Leigh, MD 09/30/17 [phone]

## 2017-09-29 NOTE — ED Provider Notes (Signed)
I saw and evaluated the patient, reviewed the resident's note and I agree with the findings and plan.  EKG: EKG Interpretation  Date/Time:  Thursday September 29 2017 21:52:49 EDT Ventricular Rate:  85 PR Interval:    QRS Duration: 81 QT Interval:  386 QTC Calculation: 459 R Axis:   56 Text Interpretation:  Sinus rhythm Prolonged PR interval Probable left atrial enlargement Borderline ST elevation, anterior leads Confirmed by Lacretia Leigh ([id number]) on 09/29/2017 11:10:78 PM   84 year old male present with sudden onset of nausea and syncopal event.  Possible seizure.  Has had some periods of sinus pauses which are suspicious for type II week event.  Blood work shows evidence of acute kidney injury.  Will be admitted for further management   Lacretia Leigh, MD 09/29/17 2354

## 2017-09-30 ENCOUNTER — Encounter (HOSPITAL_COMMUNITY): Payer: Self-pay | Admitting: Internal Medicine

## 2017-09-30 ENCOUNTER — Other Ambulatory Visit: Payer: Self-pay

## 2017-09-30 DIAGNOSIS — I130 Hypertensive heart and chronic kidney disease with heart failure and stage 1 through stage 4 chronic kidney disease, or unspecified chronic kidney disease: Secondary | ICD-10-CM | POA: Diagnosis not present

## 2017-09-30 DIAGNOSIS — N184 Chronic kidney disease, stage 4 (severe): Secondary | ICD-10-CM | POA: Diagnosis not present

## 2017-09-30 DIAGNOSIS — R55 Syncope and collapse: Secondary | ICD-10-CM | POA: Diagnosis present

## 2017-09-30 DIAGNOSIS — I5033 Acute on chronic diastolic (congestive) heart failure: Secondary | ICD-10-CM | POA: Diagnosis not present

## 2017-09-30 DIAGNOSIS — R569 Unspecified convulsions: Secondary | ICD-10-CM | POA: Diagnosis not present

## 2017-09-30 DIAGNOSIS — R001 Bradycardia, unspecified: Secondary | ICD-10-CM | POA: Diagnosis not present

## 2017-09-30 DIAGNOSIS — N179 Acute kidney failure, unspecified: Secondary | ICD-10-CM

## 2017-09-30 LAB — CBC
HEMATOCRIT: 32.7 % — AB (ref 39.0–52.0)
HEMOGLOBIN: 10.7 g/dL — AB (ref 13.0–17.0)
MCH: 32.6 pg (ref 26.0–34.0)
MCHC: 32.7 g/dL (ref 30.0–36.0)
MCV: 99.7 fL (ref 78.0–100.0)
Platelets: 232 10*3/uL (ref 150–400)
RBC: 3.28 MIL/uL — AB (ref 4.22–5.81)
RDW: 12 % (ref 11.5–15.5)
WBC: 4.2 10*3/uL (ref 4.0–10.5)

## 2017-09-30 LAB — GLUCOSE, CAPILLARY
GLUCOSE-CAPILLARY: 159 mg/dL — AB (ref 65–99)
GLUCOSE-CAPILLARY: 160 mg/dL — AB (ref 65–99)
Glucose-Capillary: 107 mg/dL — ABNORMAL HIGH (ref 65–99)

## 2017-09-30 LAB — MAGNESIUM: Magnesium: 1.8 mg/dL (ref 1.7–2.4)

## 2017-09-30 LAB — URINALYSIS, ROUTINE W REFLEX MICROSCOPIC
BILIRUBIN URINE: NEGATIVE
GLUCOSE, UA: NEGATIVE mg/dL
HGB URINE DIPSTICK: NEGATIVE
KETONES UR: NEGATIVE mg/dL
Nitrite: POSITIVE — AB
PH: 8 (ref 5.0–8.0)
Protein, ur: 100 mg/dL — AB
Specific Gravity, Urine: 1.011 (ref 1.005–1.030)

## 2017-09-30 LAB — BASIC METABOLIC PANEL
Anion gap: 9 (ref 5–15)
BUN: 67 mg/dL — ABNORMAL HIGH (ref 6–20)
CHLORIDE: 103 mmol/L (ref 101–111)
CO2: 21 mmol/L — AB (ref 22–32)
CREATININE: 2.85 mg/dL — AB (ref 0.61–1.24)
Calcium: 9.5 mg/dL (ref 8.9–10.3)
GFR calc Af Amer: 21 mL/min — ABNORMAL LOW (ref >60)
GFR calc non Af Amer: 18 mL/min — ABNORMAL LOW (ref >60)
Glucose, Bld: 144 mg/dL — ABNORMAL HIGH (ref 65–99)
POTASSIUM: 4.4 mmol/L (ref 3.5–5.1)
Sodium: 133 mmol/L — ABNORMAL LOW (ref 135–145)

## 2017-09-30 LAB — TROPONIN I
TROPONIN I: 0.06 ng/mL — AB (ref <0.03)
TROPONIN I: 0.06 ng/mL — AB (ref <0.03)
Troponin I: 0.06 ng/mL (ref <0.03)

## 2017-09-30 LAB — TSH: TSH: 3.975 u[IU]/mL (ref 0.350–4.500)

## 2017-09-30 LAB — PHENYTOIN LEVEL, TOTAL: Phenytoin Lvl: 3.6 ug/mL — ABNORMAL LOW (ref 10.0–20.0)

## 2017-09-30 LAB — MRSA PCR SCREENING: MRSA BY PCR: POSITIVE — AB

## 2017-09-30 MED ORDER — CALCITRIOL 0.25 MCG PO CAPS
0.2500 ug | ORAL_CAPSULE | Freq: Every day | ORAL | Status: DC
  Administered 2017-09-30 – 2017-10-03 (×4): 0.25 ug via ORAL
  Filled 2017-09-30 (×4): qty 1

## 2017-09-30 MED ORDER — SENNA 8.6 MG PO TABS
1.0000 | ORAL_TABLET | Freq: Every day | ORAL | Status: DC
  Administered 2017-09-30 – 2017-10-03 (×4): 8.6 mg via ORAL
  Filled 2017-09-30 (×4): qty 1

## 2017-09-30 MED ORDER — ONDANSETRON HCL 4 MG/2ML IJ SOLN: 4.0000 mg | Freq: Four times a day (QID) | INTRAMUSCULAR | Status: DC | PRN

## 2017-09-30 MED ORDER — INSULIN ASPART 100 UNIT/ML ~~LOC~~ SOLN
0.0000 [IU] | Freq: Three times a day (TID) | SUBCUTANEOUS | Status: DC
  Administered 2017-09-30 – 2017-10-02 (×6): 2 [IU] via SUBCUTANEOUS
  Administered 2017-10-03 (×2): 1 [IU] via SUBCUTANEOUS

## 2017-09-30 MED ORDER — SODIUM CHLORIDE 0.9 % IV SOLN
250.0000 mg | Freq: Two times a day (BID) | INTRAVENOUS | Status: DC
  Administered 2017-09-30 – 2017-10-01 (×4): 250 mg via INTRAVENOUS
  Filled 2017-09-30 (×4): qty 2.5

## 2017-09-30 MED ORDER — SODIUM CHLORIDE 0.9 % IV SOLN
1.0000 g | INTRAVENOUS | Status: AC
Start: 2017-09-30 — End: 2017-10-02
  Administered 2017-09-30 – 2017-10-02 (×3): 1 g via INTRAVENOUS
  Filled 2017-09-30 (×2): qty 1
  Filled 2017-09-30: qty 10

## 2017-09-30 MED ORDER — HYDRALAZINE HCL 20 MG/ML IJ SOLN: 5.0000 mg | INTRAMUSCULAR | Status: DC | PRN

## 2017-09-30 MED ORDER — CAMPHOR-MENTHOL 0.5-0.5 % EX LOTN
TOPICAL_LOTION | Freq: Two times a day (BID) | CUTANEOUS | Status: DC
  Administered 2017-09-30 – 2017-10-03 (×6): via TOPICAL
  Filled 2017-09-30: qty 222

## 2017-09-30 MED ORDER — VITAMIN D 1000 UNITS PO TABS
2000.0000 [IU] | ORAL_TABLET | Freq: Every day | ORAL | Status: DC
  Administered 2017-09-30 – 2017-10-03 (×4): 2000 [IU] via ORAL
  Filled 2017-09-30 (×4): qty 2

## 2017-09-30 MED ORDER — INSULIN GLARGINE 100 UNIT/ML ~~LOC~~ SOLN
18.0000 [IU] | Freq: Every day | SUBCUTANEOUS | Status: DC
  Administered 2017-09-30 – 2017-10-03 (×4): 18 [IU] via SUBCUTANEOUS
  Filled 2017-09-30 (×4): qty 0.18

## 2017-09-30 MED ORDER — LATANOPROST 0.005 % OP SOLN
1.0000 [drp] | Freq: Every day | OPHTHALMIC | Status: DC
  Administered 2017-09-30 – 2017-10-02 (×4): 1 [drp] via OPHTHALMIC
  Filled 2017-09-30: qty 2.5

## 2017-09-30 MED ORDER — AMLODIPINE BESYLATE 10 MG PO TABS
10.0000 mg | ORAL_TABLET | Freq: Every evening | ORAL | Status: DC
  Administered 2017-09-30 – 2017-10-02 (×3): 10 mg via ORAL
  Filled 2017-09-30 (×3): qty 1

## 2017-09-30 MED ORDER — MUPIROCIN 2 % EX OINT
1.0000 "application " | TOPICAL_OINTMENT | Freq: Two times a day (BID) | CUTANEOUS | Status: DC
  Administered 2017-09-30 – 2017-10-03 (×7): 1 via NASAL
  Filled 2017-09-30 (×2): qty 22

## 2017-09-30 MED ORDER — CLONIDINE HCL 0.2 MG PO TABS
0.2000 mg | ORAL_TABLET | Freq: Two times a day (BID) | ORAL | Status: DC
  Administered 2017-09-30: 0.2 mg via ORAL
  Filled 2017-09-30: qty 1

## 2017-09-30 MED ORDER — SODIUM CHLORIDE 0.9 % IV SOLN
700.0000 mg | Freq: Once | INTRAVENOUS | Status: DC
  Filled 2017-09-30: qty 14

## 2017-09-30 MED ORDER — PANTOPRAZOLE SODIUM 40 MG PO TBEC
40.0000 mg | DELAYED_RELEASE_TABLET | Freq: Every day | ORAL | Status: DC
  Administered 2017-09-30 – 2017-10-03 (×4): 40 mg via ORAL
  Filled 2017-09-30 (×4): qty 1

## 2017-09-30 MED ORDER — ASPIRIN 325 MG PO TABS
325.0000 mg | ORAL_TABLET | Freq: Every day | ORAL | Status: DC
  Administered 2017-09-30: 325 mg via ORAL
  Filled 2017-09-30: qty 1

## 2017-09-30 MED ORDER — ONDANSETRON HCL 4 MG PO TABS
4.0000 mg | ORAL_TABLET | Freq: Four times a day (QID) | ORAL | Status: DC | PRN
Start: 2017-09-30 — End: 2017-10-03

## 2017-09-30 MED ORDER — CHLORHEXIDINE GLUCONATE CLOTH 2 % EX PADS
6.0000 | MEDICATED_PAD | Freq: Every day | CUTANEOUS | Status: DC
  Administered 2017-09-30 – 2017-10-03 (×4): 6 via TOPICAL

## 2017-09-30 MED ORDER — PHENYTOIN SODIUM EXTENDED 100 MG PO CAPS: 300.0000 mg | ORAL_CAPSULE | Freq: Every day | ORAL | Status: DC

## 2017-09-30 MED ORDER — HYDRALAZINE HCL 50 MG PO TABS
75.0000 mg | ORAL_TABLET | Freq: Three times a day (TID) | ORAL | Status: DC
  Administered 2017-09-30 – 2017-10-03 (×10): 75 mg via ORAL
  Filled 2017-09-30 (×10): qty 1

## 2017-09-30 MED ORDER — TORSEMIDE 20 MG PO TABS: 10.0000 mg | ORAL_TABLET | Freq: Every day | ORAL | Status: DC

## 2017-09-30 MED ORDER — HEPARIN SODIUM (PORCINE) 5000 UNIT/ML IJ SOLN
5000.0000 [IU] | Freq: Three times a day (TID) | INTRAMUSCULAR | Status: DC
  Administered 2017-09-30 – 2017-10-03 (×10): 5000 [IU] via SUBCUTANEOUS
  Filled 2017-09-30 (×9): qty 1

## 2017-09-30 NOTE — Care Management Obs Status (Signed)
[facility] NOTIFICATION   Patient Details  Name: Eric [name] MRN: [id number] Date of Birth: [birth year]   Medicare Observation Status Notification Given:  Yes    Sharin Mons, RN 09/30/2017, 4:26 PM

## 2017-09-30 NOTE — Progress Notes (Signed)
[facility] PROGRESS NOTE  Eric [name] WJX:[id number] DOB: 18-Jan-1924 DOA: 09/29/2017  PCP: [name], MD  Brief History/Interval Summary: 84 year old African-American male with past medical history of chronic kidney disease stage IV, diastolic CHF, essential hypertension, chronic anemia, diabetes mellitus type 2 who was admitted last month for possible seizures and second-degree AV block.  Metoprolol was discontinued.  He presented overnight to the hospital as a result of the syncopal episode.  No mention of seizure type activity.  Patient was hospitalized for further management.  His telemetry again showed second-degree AV block with transient bradycardic episodes.  Reason for Visit: Syncope  Consultants: Cardiology  Procedures: None yet  Antibiotics: Ceftriaxone 6/14  Subjective/Interval History: Patient mentions that he has been experiencing burning sensation with urinating.  Denies any abdominal pain nausea or vomiting.  Denies any chest pain or shortness of breath.  ROS: Denies any headaches  Objective:  Vital Signs  Vitals:   09/30/17 0145 09/30/17 0309 09/30/17 0437 09/30/17 0521  BP: (!) 175/65 (!) 169/62    Pulse: (!) 46 (!) 46    Resp: 11 20    Temp:  (!) 97.4 F (36.3 C)    TempSrc:  Oral    SpO2: 99% 100%    Weight:   69.5 kg (153 lb 3.2 oz)   Height:    5\' 6"  (1.676 m)    Intake/Output Summary (Last 24 hours) at 09/30/2017 1124 Last data filed at 09/30/2017 0925 Gross per 24 hour  Intake 1340 ml  Output -  Net 1340 ml   Filed Weights   09/30/17 0437  Weight: 69.5 kg (153 lb 3.2 oz)   Telemetry was reviewed.  Occasional drop in heart rate into the 40s noted.  Some of the rhythm strips appeared to show second-degree AV block.  General appearance: alert, cooperative, appears stated age and no distress Head: Normocephalic, without obvious abnormality, atraumatic Resp: clear to auscultation bilaterally Cardio: regular rate and rhythm,  S1, S2 normal, no murmur, click, rub or gallop GI: soft, non-tender; bowel sounds normal; no masses,  no organomegaly Extremities: extremities normal, atraumatic, no cyanosis or edema Neurologic: No obvious focal neurological deficits noted.  Lab Results:  Data Reviewed: I have personally reviewed following labs and imaging studies  CBC: Recent Labs  Lab 09/29/17 2233 09/30/17 0130  WBC 5.5 4.2  NEUTROABS 3.9  --   HGB 10.7* 10.7*  HCT 32.8* 32.7*  MCV 101.2* 99.7  PLT 270 213    Basic Metabolic Panel: Recent Labs  Lab 09/29/17 2233 09/30/17 0130  NA 136 133*  K 4.4 4.4  CL 103 103  CO2 21* 21*  GLUCOSE 174* 144*  BUN 66* 67*  CREATININE 2.93* 2.85*  CALCIUM 9.9 9.5  MG  --  1.8    GFR: Estimated Creatinine Clearance: 15.2 mL/min (A) (by C-G formula based on SCr of 2.85 mg/dL (H)).  Liver Function Tests: Recent Labs  Lab 09/29/17 2233  AST 20  ALT 18  ALKPHOS 133*  BILITOT 0.3  PROT 7.4  ALBUMIN 3.1*    Cardiac Enzymes: Recent Labs  Lab 09/30/17 0130 09/30/17 0658  TROPONINI 0.06* 0.06*    Thyroid Function Tests: Recent Labs    09/30/17 0130  TSH 3.975     Recent Results (from the past 240 hour(s))  MRSA PCR Screening     Status: Abnormal   Collection Time: 09/30/17  3:13 AM  Result Value Ref Range Status   MRSA by PCR POSITIVE (A) NEGATIVE  Final    Comment:        The GeneXpert MRSA Assay (FDA approved for NASAL specimens only), is one component of a comprehensive MRSA colonization surveillance program. It is not intended to diagnose MRSA infection nor to guide or monitor treatment for MRSA infections. RESULT CALLED TO, READ BACK BY AND VERIFIED WITH: Arlyn Dunning RN, AT [phone] 09/30/17 BY Rush Landmark       Radiology Studies: Dg Chest Portable 1 View  Result Date: 09/29/2017 CLINICAL DATA:  Acute onset nausea and vomiting, syncopal episode. EXAM: PORTABLE CHEST 1 VIEW COMPARISON:  Chest radiograph Aug 26, 2017 FINDINGS: The  cardiac silhouette is mildly enlarged, improved from prior imaging. Calcified aortic knob. Mildly elevated RIGHT hemidiaphragm. Strandy densities LEFT base. No focal consolidation. Biapical thickening. No pneumothorax. Osteopenia. Symmetric calcification likely vascular. IMPRESSION: Mild cardiomegaly.  LEFT lung base atelectasis/scarring. Aortic Atherosclerosis (ICD10-I70.0). Electronically Signed   By: Elon Alas M.D.   On: 09/29/2017 23:02     Medications:  Scheduled: . amLODipine  10 mg Oral QPM  . aspirin  325 mg Oral Daily  . calcitRIOL  0.25 mcg Oral Daily  . camphor-menthol   Topical BID  . Chlorhexidine Gluconate Cloth  6 each Topical Q0600  . cholecalciferol  2,000 Units Oral Daily  . cloNIDine  0.2 mg Oral BID  . heparin  5,000 Units Subcutaneous Q8H  . hydrALAZINE  75 mg Oral TID  . insulin aspart  0-9 Units Subcutaneous TID WC  . insulin glargine  18 Units Subcutaneous Daily  . latanoprost  1 drop Both Eyes QHS  . mupirocin ointment  1 application Nasal BID  . pantoprazole  40 mg Oral Daily  . senna  1 tablet Oral Daily   Continuous: . cefTRIAXone (ROCEPHIN)  IV    . levETIRAcetam Stopped (09/30/17 0357)   OEU:[id number], ondansetron **OR** ondansetron (ZOFRAN) IV  Assessment/Plan:    Syncope Etiology unclear.  Patient does have some rhythm abnormalities on telemetry.  During his last hospitalization there was some concern for seizure activity.  Patient is on Dilantin with a subtherapeutic level.  Overnight this was discussed with the on-call neurology who recommended changing Dilantin to Keppra since Dilantin can cause bradycardia.  Patient denies any chest pain. Cardiology is also been consulted and we await their input.  Troponin noted to be 0.06.  Patient denies chest pain.  Significance of this is unclear.  Patient does have renal disease and could be reason for his troponin elevation.  Acute on chronic kidney disease stage IV Treatment appears to be  around 2.2-2.3.  Noted to be 2.93 at admission with a high BUN as well.  Holding patient's lisinopril.  Patient was given fluid bolus in the emergency department.  Renal function has improved today.  Apparently Lasix was recently changed to torsemide by his cardiologist about 2 weeks ago.  Holding for now.  Monitor urine output.  Acute UTI UA noted to be abnormal.  Patient does mention symptoms suggestive of UTI.  Urine culture.  Ceftriaxone.  Noted to have E. coli UTI back in April.  He is allergic to penicillin but has tolerated cephalosporins including ceftriaxone previously.  Essential hypertension Monitor blood pressures closely.  Continue amlodipine and hydralazine.  Holding lisinopril due to worsening creatinine.  Chronic diastolic CHF Last EF measured was 55 to 60% with grade 1 diastolic dysfunction.  He was somewhat volume depleted at admission.  His diuretics have been held.  He was given fluids in the emergency department.  Volume status appears to be better today.  Appears to be well compensated.  Continue to watch.  Diabetes mellitus type 2, controlled with renal complications Patient is on Lantus insulin at home.  Monitor CBGs.  Continue SSI.  History of seizure disorder Dilantin changed over to [place] per neurology recommendation.  Anemia likely secondary to chronic kidney disease Hemoglobin is stable and at baseline.  DVT Prophylaxis: Subcutaneous heparin    Code Status: Full code Family Communication:.  No family at bedside. Disposition Plan: Management as outlined above.  Await cardiology input.  PT Evaluation.    LOS: 0 days   [facility] Pager [phone] 09/30/2017, 11:24 AM  If 7PM-7AM, please contact night-coverage at [email or web address], password [facility]

## 2017-09-30 NOTE — H&P (Addendum)
History and Physical    Eric [name] ZYS:[id number] DOB: [birth year] DOA: 09/29/2017  PCP: [name], MD  Patient coming from: Home.  Chief Complaint: Loss of consciousness.  History obtained from patient and patient's sister.  HPI: Eric [name] is a 84 y.o. male with history of chronic kidney disease stage IV, diastolic CHF, hypertension, chronic anemia, diabetes mellitus type 2 who was admitted last month for possible seizures and second-degree AV block at that time metoprolol was discontinued was brought to the ER today after patient was found to have a syncopal episode.  As per the patient's history patient was talking to his friend when suddenly lost consciousness for 2 or 3 minutes.  Did not have any incontinence of urine no seizure-like activity.  Did not have any chest pain shortness of breath nausea vomiting or diarrhea.  Patient was recently placed on torsemide for CHF by his cardiologist.  Patient has been compliant with his Dilantin.  ED Course: In the ER patient appeared nonfocal.  On the way to the ER patient was found to be in second-degree AV block type I transiently with bradycardic episodes.  Dilantin level was subtherapeutic at 3.6.  Chest x-ray was unremarkable patient is afebrile and is admitted for further management of syncope.  Review of Systems: As per HPI, rest all negative.   Past Medical History:  Diagnosis Date  . Adult failure to thrive   . Altered mental status   . ANEMIA-NOS 02/17/2007  . Anxiety   . BACK PAIN, LUMBAR   . BENIGN PROSTATIC HYPERTROPHY   . Bilateral leg edema   . CAD (coronary artery disease)    a. nonobstructive CAD by cath in 2013  . CAP (community acquired pneumonia)   . CKD (chronic kidney disease)   . DEPRESSION 12/25/2008  . Diabetes mellitus   . DIABETES MELLITUS, TYPE I, CONTROLLED, WITH RETINOPATHY   . Encephalopathy, metabolic   . Gastroparesis   . GERD 02/17/2007  . Hyperlipidemia   . HYPERTENSION 02/17/2007  .  Hyponatremia   . LIVER DISORDER   . OSTEOARTHRITIS 03/04/2010  . Pneumonia   . PSA, INCREASED   . Stroke ([place])   . UTI (urinary tract infection) 07/2017  . VITAMIN B12 DEFICIENCY     Past Surgical History:  Procedure Laterality Date  . LEFT HEART CATHETERIZATION WITH CORONARY ANGIOGRAM N/A 04/23/2011   Procedure: LEFT HEART CATHETERIZATION WITH CORONARY ANGIOGRAM;  Surgeon: Peter M Martinique, MD;  Location: [facility] CATH LAB;  Service: Cardiovascular;  Laterality: N/A;  . TRANSURETHRAL RESECTION OF PROSTATE       reports that he has never smoked. He has never used smokeless tobacco. He reports that he does not drink alcohol or use drugs.  Allergies  Allergen Reactions  . Penicillins   . Hydroxyzine Rash  . Percocet [Oxycodone-Acetaminophen] Rash    Family History  Problem Relation Age of Onset  . Cancer Father        had uncertain type of cancer  . Cancer Brother        Prostate Cancer  . Cancer Brother        Prostate Cancer  . Diabetes Other     Prior to Admission medications   Medication Sig Start Date End Date Taking? Authorizing Provider  acetaminophen (TYLENOL) 500 MG tablet Take 500 mg by mouth 2 (two) times daily. Reported on 06/19/2015   Yes [provider]  amLODipine (NORVASC) 10 MG tablet Take 10 mg by mouth every evening.  05/26/16  Yes [provider]  calamine lotion Apply 1 application topically 2 (two) times daily as needed for itching (to shoulders/ back).   Yes [provider]  calcitRIOL (ROCALTROL) 0.25 MCG capsule Take 0.25 mcg by mouth daily.  12/26/11  Yes [provider]  cholecalciferol (VITAMIN D) 1000 UNITS tablet Take 2,000 Units by mouth daily.    Yes [provider]  cloNIDine (CATAPRES) 0.2 MG tablet Take 1 tablet (0.2 mg total) by mouth 2 (two) times daily. 08/29/17  Yes Adhikari, Tamsen Meek, MD  donepezil (ARICEPT) 10 MG tablet Take 1 tablet (10 mg total) by mouth at bedtime. 06/22/16  Yes Dennie Bible, NP    fluticasone [facility]) 50 MCG/ACT nasal spray Place 1 spray into both nostrils 2 (two) times daily. Patient taking differently: Place 1 spray into both nostrils 2 (two) times daily as needed for allergies.  10/04/16  Yes Patrecia Pour, Christean Grief, MD  guaiFENesin (MUCINEX) 600 MG 12 hr tablet Take 2 tablets (1,200 mg total) by mouth 2 (two) times daily. 08/29/17  Yes Shelly Coss, MD  hydrALAZINE (APRESOLINE) 25 MG tablet Take 75 mg by mouth 3 (three) times daily.  10/22/14  Yes [provider]  Insulin Glargine (LANTUS SOLOSTAR) 100 UNIT/ML Solostar Pen Inject 22 Units into the skin daily at 10 pm.    Yes [provider]  insulin lispro (HUMALOG) 100 UNIT/ML injection Inject 0-5 Units into the skin 3 (three) times daily before meals. Sliding scale   Yes [provider]  latanoprost (XALATAN) 0.005 % ophthalmic solution Place 1 drop into both eyes at bedtime. 06/11/17  Yes [provider]  lisinopril (PRINIVIL,ZESTRIL) 20 MG tablet Take 20 mg by mouth daily. 09/01/17  Yes [provider]  loratadine (CLARITIN) 10 MG tablet Take 10 mg by mouth daily with lunch.   Yes [provider]  metoprolol succinate (TOPROL-XL) 50 MG 24 hr tablet Take 50 mg by mouth daily. 09/29/17  Yes [provider]  OVER THE COUNTER MEDICATION Apply 1 application topically 4 (four) times daily as needed (clotrimazole= to the buttocks for rash as needed).    Yes [provider]  pantoprazole (PROTONIX) 40 MG tablet Take 40 mg by mouth daily.  05/04/15  Yes [provider]  phenytoin (DILANTIN) 100 MG ER capsule Take 200 mg by mouth daily.   Yes [provider]  Polyethyl Glycol-Propyl Glycol (SYSTANE ULTRA) 0.4-0.3 % SOLN Place 1 drop into both eyes 3 (three) times daily as needed (dry eyes).   Yes [provider]  polyethylene glycol powder (GLYCOLAX/MIRALAX) powder Take 17 g by mouth daily as needed for mild constipation.  05/18/14  Yes  [provider]  Probiotic Product (PROBIOTIC-10 PO) Take 1 capsule by mouth daily.   Yes [provider]  senna (SENOKOT) 8.6 MG TABS Take 1 tablet by mouth daily.   Yes [provider]  torsemide (DEMADEX) 10 MG tablet Take 10 mg by mouth daily. 09/15/17  Yes [provider]  ACCU-CHEK AVIVA PLUS test strip  05/01/14   [provider]  B-D UF III MINI PEN NEEDLES 31G X 5 MM MISC  06/20/14   [provider]  diclofenac sodium (VOLTAREN) 1 % GEL Apply 2 g topically 4 (four) times daily. Patient not taking: Reported on 08/26/2017 03/08/17   Leandrew Koyanagi, MD    Physical Exam: Vitals:   09/29/17 2330 09/29/17 2345 09/30/17 0015 09/30/17 0030  BP: (!) 186/87 (!) 178/95 (!) 169/61 Marland Kitchen)  164/80  Pulse: (!) 38  (!) 27 94  Resp: 13 11 10  (!) 22  Temp:      TempSrc:      SpO2: 100%  98% 100%      Constitutional: Moderately built and nourished. Vitals:   09/29/17 2330 09/29/17 2345 09/30/17 0015 09/30/17 0030  BP: (!) 186/87 (!) 178/95 (!) 169/61 (!) 164/80  Pulse: (!) 38  (!) 27 94  Resp: 13 11 10  (!) 22  Temp:      TempSrc:      SpO2: 100%  98% 100%   Eyes: Anicteric no pallor. ENMT: No discharge from the ears eyes nose or mouth. Neck: No mass felt.  No neck rigidity no JVD appreciated. Respiratory: No rhonchi or crepitations. Cardiovascular: S1-S2 heard no murmurs appreciated. Abdomen: Soft nontender bowel sounds present. Musculoskeletal: No edema.  No joint effusion. Skin: No rash. Neurologic: Alert awake oriented to time place and person.  Moves all extremities 5 x 5. Psychiatric: Appears normal per normal affect.   Labs on Admission: I have personally reviewed following labs and imaging studies  CBC: Recent Labs  Lab 09/29/17 2233  WBC 5.5  NEUTROABS 3.9  HGB 10.7*  HCT 32.8*  MCV 101.2*  PLT 469   Basic Metabolic Panel: Recent Labs  Lab 09/29/17 2233  NA 136  K 4.4  CL 103  CO2 21*  GLUCOSE 174*  BUN 66*    CREATININE 2.93*  CALCIUM 9.9   GFR: CrCl cannot be calculated (Unknown ideal weight.). Liver Function Tests: Recent Labs  Lab 09/29/17 2233  AST 20  ALT 18  ALKPHOS 133*  BILITOT 0.3  PROT 7.4  ALBUMIN 3.1*   No results for input(s): LIPASE, AMYLASE in the last 168 hours. No results for input(s): AMMONIA in the last 168 hours. Coagulation Profile: No results for input(s): INR, PROTIME in the last 168 hours. Cardiac Enzymes: No results for input(s): CKTOTAL, CKMB, CKMBINDEX, TROPONINI in the last 168 hours. BNP (last 3 results) No results for input(s): PROBNP in the last 8760 hours. HbA1C: No results for input(s): HGBA1C in the last 72 hours. CBG: No results for input(s): GLUCAP in the last 168 hours. Lipid Profile: No results for input(s): CHOL, HDL, LDLCALC, TRIG, CHOLHDL, LDLDIRECT in the last 72 hours. Thyroid Function Tests: No results for input(s): TSH, T4TOTAL, FREET4, T3FREE, THYROIDAB in the last 72 hours. Anemia Panel: No results for input(s): VITAMINB12, FOLATE, FERRITIN, TIBC, IRON, RETICCTPCT in the last 72 hours. Urine analysis:    Component Value Date/Time   COLORURINE COLORLESS (A) 08/26/2017 2020   APPEARANCEUR CLEAR 08/26/2017 2020   LABSPEC 1.004 (L) 08/26/2017 2020   PHURINE 7.0 08/26/2017 2020   GLUCOSEU NEGATIVE 08/26/2017 2020   GLUCOSEU 500 03/30/2011 1055   HGBUR SMALL (A) 08/26/2017 2020   HGBUR moderate 10/03/2008 1349   [place] 08/26/2017 2020   KETONESUR NEGATIVE 08/26/2017 2020   PROTEINUR 30 (A) 08/26/2017 2020   UROBILINOGEN 0.2 11/09/2014 1717   NITRITE NEGATIVE 08/26/2017 2020   LEUKOCYTESUR NEGATIVE 08/26/2017 2020   Sepsis Labs: @LABRCNTIP (procalcitonin:4,lacticidven:4) )No results found for this or any previous visit (from the past 240 hour(s)).   Radiological Exams on Admission: Dg Chest Portable 1 View  Result Date: 09/29/2017 CLINICAL DATA:  Acute onset nausea and vomiting, syncopal episode. EXAM:  PORTABLE CHEST 1 VIEW COMPARISON:  Chest radiograph Aug 26, 2017 FINDINGS: The cardiac silhouette is mildly enlarged, improved from prior imaging. Calcified aortic knob. Mildly elevated RIGHT hemidiaphragm. Strandy densities LEFT  base. No focal consolidation. Biapical thickening. No pneumothorax. Osteopenia. Symmetric calcification likely vascular. IMPRESSION: Mild cardiomegaly.  LEFT lung base atelectasis/scarring. Aortic Atherosclerosis (ICD10-I70.0). Electronically Signed   By: Elon Alas M.D.   On: 09/29/2017 23:02    EKG: Independently reviewed.  Normal sinus rhythm with borderline ST changes in the anterior leads.  Assessment/Plan Active Problems:   Essential hypertension   SYNCOPE   CKD (chronic kidney disease) stage 3, GFR 30-59 ml/min (HCC)   MGUS (monoclonal gammopathy of unknown significance)   Seizure (HCC)   DM2 (diabetes mellitus, type 2) (HCC)   Diastolic CHF (HCC)   Syncope    1. Syncope -cause not clear.  Patient has been transiently in second-degree AV block type I.  Not sure if patient was taking his Toprol still.  Patient's Dilantin is also subtherapeutic.  Discussed with neurologist on-call Dr. Leonel Ramsay who advised to change Dilantin to Keppra since Dilantin can cause bradycardia.  We will cycle cardiac markers consult cardiology hold Aricept due to bradycardia.  Patient EKG shows some nonspecific ST changes in anterior leads.  Will discuss with cardiology. 2. Acute on chronic kidney disease stage IV -creatinine has increased from baseline from last month.  Will hold lisinopril for now.  Patient was given 1 L fluid bolus in the ER.  Patient had Lasix changed to torsemide by cardiologist 2 weeks ago.  For now we will hold torsemide and reassess in the morning.  Closely follow metabolic panel. 3. Hypertension uncontrolled -holding lisinopril due to worsening renal function.  We will place patient on hydralazine as needed along with home dose of amlodipine and  hydralazine. 4. Chronic diastolic CHF last EF measured was 55 to 60% with grade 1 diastolic dysfunction -holding torsemide for now.  Reassess in the morning.  Patient received fluid in the ER for acute on chronic renal failure. 5. Diabetes mellitus type 2 on Lantus insulin  -I decreased patient's home dose by few units with sliding scale coverage.  Follow CBGs closely. 6. History of seizures see #1. 7. Anemia likely from chronic kidney disease -follow CBC.  Since first EKG was showing ST-T changes in anterior leads we will repeat another EKG.  Denies any chest pain.   DVT prophylaxis: Heparin. Code Status: Full code. Family Communication: Patient's sister. Disposition Plan: Home. Consults called: Cardiology.  Discussed with neurologist. Admission status: Observation.   Rise Patience MD [facility] Pager [phone].  If 7PM-7AM, please contact night-coverage [email or web address] Password [email or web address]  09/30/2017, 1:09 AM

## 2017-09-30 NOTE — Progress Notes (Signed)
Dr. Einar Gip rounded and informed patient and his sister that he will stay in hospital through the weekend and no pacemaker insertion today.  Order entered for patient to eat.  Food ordered.

## 2017-09-30 NOTE — Care Management Note (Addendum)
Case Management Note  Patient Details  Name: Eric [name] MRN: [id number] Date of Birth: [birth year]  Subjective/Objective:       Presents with syncopal episode. From home alone. Privately pays for PCS,24/7 care. PTA active with [facility] for home health services (RN,PT,OT).             Kirstie Mirza (Sister) Somtochukwu Woollard (Son)   [name] [phone]        [phone]                               PCP:  [name]  Action/Plan: Transition to home when medically stable.... NCM following for disposition needs.  Expected Discharge Date:                  Expected Discharge Plan:  [facility](lives alone privately pays for aide services)  In-House Referral:     Discharge planning Services  CM Consult  Post Acute Care Choice:  Resumption of Svcs/PTA Provider, Home Health Choice offered to:  Patient  DME Arranged:  (pt would like to have portable light weight wheelchair) DME Agency:     HH Arranged:  PT, RN [place] Agency:  [facility]  Status of Service:  In process, will continue to follow  If discussed at Long Length of Stay Meetings, dates discussed:    Additional Comments:  Sharin Mons, RN 09/30/2017, 4:36 PM

## 2017-09-30 NOTE — Progress Notes (Addendum)
DR. [place] Pink called and stated orders entered for patient to be NPO for possible pacemaker insertion later.  Patient and his sister, at bedside, updated on plan of care.  1st Bactroban given nasally - Contact precautions explained to patient and his sister regarding his daily baths, bactroban, and staff's use of gowns and gloves for infection prevention.  Explained gowns and gloves recommended and not required for family members.  Both stated understanding.  Urine culture sent.

## 2017-10-01 DIAGNOSIS — Z794 Long term (current) use of insulin: Secondary | ICD-10-CM | POA: Diagnosis not present

## 2017-10-01 DIAGNOSIS — R55 Syncope and collapse: Secondary | ICD-10-CM | POA: Diagnosis not present

## 2017-10-01 DIAGNOSIS — E1122 Type 2 diabetes mellitus with diabetic chronic kidney disease: Secondary | ICD-10-CM | POA: Diagnosis not present

## 2017-10-01 DIAGNOSIS — R569 Unspecified convulsions: Secondary | ICD-10-CM

## 2017-10-01 DIAGNOSIS — I5032 Chronic diastolic (congestive) heart failure: Secondary | ICD-10-CM | POA: Diagnosis not present

## 2017-10-01 DIAGNOSIS — N183 Chronic kidney disease, stage 3 (moderate): Secondary | ICD-10-CM | POA: Diagnosis not present

## 2017-10-01 LAB — BASIC METABOLIC PANEL
ANION GAP: 8 (ref 5–15)
BUN: 57 mg/dL — ABNORMAL HIGH (ref 6–20)
CALCIUM: 9 mg/dL (ref 8.9–10.3)
CO2: 20 mmol/L — AB (ref 22–32)
Chloride: 110 mmol/L (ref 101–111)
Creatinine, Ser: 2.47 mg/dL — ABNORMAL HIGH (ref 0.61–1.24)
GFR, EST AFRICAN AMERICAN: 25 mL/min — AB (ref >60)
GFR, EST NON AFRICAN AMERICAN: 21 mL/min — AB (ref >60)
Glucose, Bld: 112 mg/dL — ABNORMAL HIGH (ref 65–99)
Potassium: 4.3 mmol/L (ref 3.5–5.1)
SODIUM: 138 mmol/L (ref 135–145)

## 2017-10-01 LAB — GLUCOSE, CAPILLARY
GLUCOSE-CAPILLARY: 162 mg/dL — AB (ref 65–99)
GLUCOSE-CAPILLARY: 192 mg/dL — AB (ref 65–99)
Glucose-Capillary: 97 mg/dL (ref 65–99)
Glucose-Capillary: 159 mg/dL — ABNORMAL HIGH (ref 65–99)

## 2017-10-01 MED ORDER — PINDOLOL 5 MG PO TABS
5.0000 mg | ORAL_TABLET | Freq: Two times a day (BID) | ORAL | Status: DC
  Administered 2017-10-01 – 2017-10-03 (×5): 5 mg via ORAL
  Filled 2017-10-01 (×5): qty 1

## 2017-10-01 MED ORDER — LEVETIRACETAM 250 MG PO TABS
250.0000 mg | ORAL_TABLET | Freq: Two times a day (BID) | ORAL | Status: DC
  Administered 2017-10-01 – 2017-10-03 (×4): 250 mg via ORAL
  Filled 2017-10-01 (×4): qty 1

## 2017-10-01 NOTE — Consult Note (Signed)
CARDIOLOGY CONSULT NOTE  Patient ID: Eric [name] MRN: [id number] DOB/AGE: [id number] 82 y.o.  Admit date: 09/29/2017 Referring Physician  Bonnielee Haff Primary Physician:  [name], MD Reason for Consultation  Bradycardia and syncope  HPI: Eric [name]  is a 84 y.o. male  With history of hypertension, diabetes mellitus, mild dementia, history of seizure disorder, who lives at home with his wife,  was admitted to the hospital after he was talking to his friend in the bed, suddenly had syncope without any seizure activity, also had one bout of emesis after the syncopal episode, 911 was called, patient was alert and oriented when the 911 arrived.  No chest pain prior to the episode, no fever or chills, denied any dyspnea.  He was found to be markedly bradycardic with 2: 1 AV conduction on the EKG, I was consulted to opine regarding his syncope and bradycardia.  Patient's wife is present at the bedside, states that he is doing well and has no specific complaints today.  Past Medical History:  Diagnosis Date  . Adult failure to thrive   . Altered mental status   . ANEMIA-NOS 02/17/2007  . Anxiety   . BACK PAIN, LUMBAR   . BENIGN PROSTATIC HYPERTROPHY   . Bilateral leg edema   . CAD (coronary artery disease)    a. nonobstructive CAD by cath in 2013  . CAP (community acquired pneumonia)   . CKD (chronic kidney disease)   . DEPRESSION 12/25/2008  . Diabetes mellitus   . DIABETES MELLITUS, TYPE I, CONTROLLED, WITH RETINOPATHY   . Encephalopathy, metabolic   . Gastroparesis   . GERD 02/17/2007  . Hyperlipidemia   . HYPERTENSION 02/17/2007  . Hyponatremia   . LIVER DISORDER   . OSTEOARTHRITIS 03/04/2010  . Pneumonia   . PSA, INCREASED   . Stroke ([place])   . UTI (urinary tract infection) 07/2017  . VITAMIN B12 DEFICIENCY      Past Surgical History:  Procedure Laterality Date  . LEFT HEART CATHETERIZATION WITH CORONARY ANGIOGRAM N/A 04/23/2011   Procedure: LEFT HEART  CATHETERIZATION WITH CORONARY ANGIOGRAM;  Surgeon: Peter M Martinique, MD;  Location[facility] C[facility] LAB;  Service: Cardiovascular;  Laterality: N/A;  . TRANSURETHRAL RESECTION OF PROSTATE       Family History  Problem Relation Age of Onset  . Cancer Father        had uncertain type of cancer  . Cancer Brother        Prostate Cancer  . Cancer Brother        Prostate Cancer  . Diabetes Other      Social History: Social History   Socioeconomic History  . Marital status: Widowed    Spouse name: Not on file  . Number of children: Not on file  . Years of education: Not on file  . Highest education level: Not on file  Occupational History  . 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 Financial resource strain: Not on file  . Food insecurity:    Worry: Not on file    Inability: Not on file  . Transportation needs:    Medical: Not on file    Non-medical: Not on file  Tobacco Use  . Smoking status: Never Smoker  . Smokeless tobacco: Never Used  Substance and Sexual Activity  . Alcohol use: No  . Drug use: No  . Sexual activity: Never  Lifestyle  . Physical activity:  Days per week: Not on file    Minutes per session: Not on file  . Stress: Not on file  Relationships  . Social connections:    Talks on phone: Not on file    Gets together: Not on file    Attends religious service: Not on file    Active member of club or organization: Not on file    Attends meetings of clubs or organizations: Not on file    Relationship status: Not on file  . Intimate partner violence:    Fear of current or ex partner: Not on file    Emotionally abused: Not on file    Physically abused: Not on file    Forced sexual activity: Not on file  Other Topics Concern  . Not on file  Social History Narrative   Works Armed forces operational officer.   Widowed 2010.   Regular exercise-no     Medications Prior to Admission  Medication Sig Dispense Refill Last Dose  .  acetaminophen (TYLENOL) 500 MG tablet Take 500 mg by mouth 2 (two) times daily. Reported on 06/19/2015   09/29/2017 at Unknown time  . amLODipine (NORVASC) 10 MG tablet Take 10 mg by mouth every evening.    09/29/2017 at Unknown time  . calamine lotion Apply 1 application topically 2 (two) times daily as needed for itching (to shoulders/ back).   Past Week at Unknown time  . calcitRIOL (ROCALTROL) 0.25 MCG capsule Take 0.25 mcg by mouth daily.    09/29/2017 at Unknown time  . cholecalciferol (VITAMIN D) 1000 UNITS tablet Take 2,000 Units by mouth daily.    09/29/2017 at Unknown time  . cloNIDine (CATAPRES) 0.2 MG tablet Take 1 tablet (0.2 mg total) by mouth 2 (two) times daily. 60 tablet 0 09/29/2017 at Unknown time  . donepezil (ARICEPT) 10 MG tablet Take 1 tablet (10 mg total) by mouth at bedtime. 30 tablet 6 09/29/2017 at Unknown time  . fluticasone (FLONASE) 50 MCG/ACT nasal spray Place 1 spray into both nostrils 2 (two) times daily. (Patient taking differently: Place 1 spray into both nostrils 2 (two) times daily as needed for allergies. )  2 Past Week at Unknown time  . guaiFENesin (MUCINEX) 600 MG 12 hr tablet Take 2 tablets (1,200 mg total) by mouth 2 (two) times daily. 10 tablet 0 09/29/2017 at Unknown time  . hydrALAZINE (APRESOLINE) 25 MG tablet Take 75 mg by mouth 3 (three) times daily.   1 09/29/2017 at Unknown time  . Insulin Glargine (LANTUS SOLOSTAR) 100 UNIT/ML Solostar Pen Inject 22 Units into the skin daily at 10 pm.    09/29/2017 at Unknown time  . insulin lispro (HUMALOG) 100 UNIT/ML injection Inject 0-5 Units into the skin 3 (three) times daily before meals. Sliding scale   09/29/2017 at Unknown time  . latanoprost (XALATAN) 0.005 % ophthalmic solution Place 1 drop into both eyes at bedtime.   09/29/2017 at Unknown time  . lisinopril (PRINIVIL,ZESTRIL) 20 MG tablet Take 20 mg by mouth daily.   09/29/2017 at Unknown time  . loratadine (CLARITIN) 10 MG tablet Take 10 mg by mouth daily with lunch.    09/29/2017 at Unknown time  . metoprolol succinate (TOPROL-XL) 50 MG 24 hr tablet Take 50 mg by mouth daily.   09/29/2017 at 0800  . OVER THE COUNTER MEDICATION Apply 1 application topically 4 (four) times daily as needed (clotrimazole= to the buttocks for rash as needed).    Past Week at Unknown time  . pantoprazole (  PROTONIX) 40 MG tablet Take 40 mg by mouth daily.    09/29/2017 at Unknown time  . phenytoin (DILANTIN) 100 MG ER capsule Take 200 mg by mouth daily.   09/29/2017 at Unknown time  . Polyethyl Glycol-Propyl Glycol (SYSTANE ULTRA) 0.4-0.3 % SOLN Place 1 drop into both eyes 3 (three) times daily as needed (dry eyes).   Past Week at Unknown time  . polyethylene glycol powder (GLYCOLAX/MIRALAX) powder Take 17 g by mouth daily as needed for mild constipation.   12 Past Week at Unknown time  . Probiotic Product (PROBIOTIC-10 PO) Take 1 capsule by mouth daily.   09/29/2017 at Unknown time  . senna (SENOKOT) 8.6 MG TABS Take 1 tablet by mouth daily.   09/29/2017 at Unknown time  . torsemide (DEMADEX) 10 MG tablet Take 10 mg by mouth daily.  1 09/29/2017 at Unknown time  . ACCU-CHEK AVIVA PLUS test strip    Taking  . B-D UF III MINI PEN NEEDLES 31G X 5 MM MISC   3 Taking  . diclofenac sodium (VOLTAREN) 1 % GEL Apply 2 g topically 4 (four) times daily. (Patient not taking: Reported on 08/26/2017) 1 Tube 5 Not Taking at Unknown time   Review of Systems  Constitutional: Negative.   HENT: Negative.   Eyes: Negative.   Respiratory: Negative.   Cardiovascular: Negative.   Gastrointestinal: Negative.   Genitourinary: Negative.   Musculoskeletal: Negative.   Skin: Negative.   Neurological: Negative.   Endo/Heme/Allergies: Negative.   Psychiatric/Behavioral: Negative.     Physical Exam  Constitutional: He is oriented to person, place, and time. He appears well-developed and well-nourished. No distress.  HENT:  Head: Atraumatic.  Eyes: Conjunctivae are normal.  Neck: Normal range of motion.  Neck supple.  Cardiovascular: Normal rate, regular rhythm and normal heart sounds. Exam reveals no gallop and no friction rub.  No murmur heard. Bradycardia present, pedal pulse absent  Pulmonary/Chest: Effort normal. No respiratory distress. He has no wheezes. He has rales (right base). He exhibits no tenderness.  Abdominal: Soft. Bowel sounds are normal.  Musculoskeletal: Normal range of motion. He exhibits no edema or tenderness.  Neurological: He is alert and oriented to person, place, and time.  Skin: Skin is warm and dry.  Psychiatric: He has a normal mood and affect.    Physical Exam: Blood pressure (!) 103/59, pulse (!) 110, temperature 98.4 F (36.9 C), temperature source Oral, resp. rate 18, height 5\' 6"  (1.676 m), weight 69.5 kg (153 lb 4.8 oz), SpO2 100 %.  Labs:   Lab Results  Component Value Date   WBC 4.2 09/30/2017   HGB 10.7 (L) 09/30/2017   HCT 32.7 (L) 09/30/2017   MCV 99.7 09/30/2017   PLT 232 09/30/2017    Recent Labs  Lab 09/29/17 2233 09/30/17 0130  NA 136 133*  K 4.4 4.4  CL 103 103  CO2 21* 21*  BUN 66* 67*  CREATININE 2.93* 2.85*  CALCIUM 9.9 9.5  PROT 7.4  --   BILITOT 0.3  --   ALKPHOS 133*  --   ALT 18  --   AST 20  --   GLUCOSE 174* 144*    Lipid Panel     Component Value Date/Time   CHOL 159 11/10/2014 0855   TRIG 112 11/10/2014 0855   HDL 31 (L) 11/10/2014 0855   CHOLHDL 5.1 11/10/2014 0855   VLDL 22 11/10/2014 0855   LDLCALC 106 (H) 11/10/2014 0855    BNP (last 3  results) Recent Labs    06/29/17 1329 08/28/17 0554  BNP 256.0* 788.1*    HEMOGLOBIN A1C Lab Results  Component Value Date   HGBA1C 6.0 (H) 08/27/2017   MPG 125.5 08/27/2017    Cardiac Panel (last 3 results) Recent Labs    09/30/17 0130 09/30/17 0658 09/30/17 1232  TROPONINI 0.06* 0.06* 0.06*   TSH Recent Labs    08/27/17 0043 09/30/17 0130  TSH 3.008 3.975    Radiology: Dg Chest Portable 1 View  Result Date: 09/29/2017 CLINICAL DATA:   Acute onset nausea and vomiting, syncopal episode. EXAM: PORTABLE CHEST 1 VIEW COMPARISON:  Chest radiograph Aug 26, 2017 FINDINGS: The cardiac silhouette is mildly enlarged, improved from prior imaging. Calcified aortic knob. Mildly elevated RIGHT hemidiaphragm. Strandy densities LEFT base. No focal consolidation. Biapical thickening. No pneumothorax. Osteopenia. Symmetric calcification likely vascular. IMPRESSION: Mild cardiomegaly.  LEFT lung base atelectasis/scarring. Aortic Atherosclerosis (ICD10-I70.0). Electronically Signed   By: Elon Alas M.D.   On: 09/29/2017 23:02    Scheduled Meds: . amLODipine  10 mg Oral QPM  . calcitRIOL  0.25 mcg Oral Daily  . camphor-menthol   Topical BID  . Chlorhexidine Gluconate Cloth  6 each Topical Q0600  . cholecalciferol  2,000 Units Oral Daily  . heparin  5,000 Units Subcutaneous Q8H  . hydrALAZINE  75 mg Oral TID  . insulin aspart  0-9 Units Subcutaneous TID WC  . insulin glargine  18 Units Subcutaneous Daily  . latanoprost  1 drop Both Eyes QHS  . mupirocin ointment  1 application Nasal BID  . pantoprazole  40 mg Oral Daily  . pindolol  5 mg Oral BID  . senna  1 tablet Oral Daily   Continuous Infusions: . cefTRIAXone (ROCEPHIN)  IV Stopped (09/30/17 1425)  . levETIRAcetam Stopped (09/30/17 2250)   PRN Meds:.hydrALAZINE, ondansetron **OR** ondansetron (ZOFRAN) IV  CARDIAC STUDIES:  EKG/Telemetry 09/30/2017: Underlying sinus rhythm with 2: 1 AV conduction, ventricular rate 44 bpm, otherwise normal QRS, normal QT interval.  Telemetry reveals episodes of Wenckebach, episodes mostly 2: 1 AV conduction with underlying sinus rhythm with marked bradycardia.  Carotid sinus massage: Induction of bradycardia to a rate of 39 to 40 bpm with resolution of 2: 1 conduction.  Echocardiogram 08/29/2017: Normal LV systolic function, mild LVH, EF 55 to 89%, mild diastolic dysfunction, moderately calcified mitral valve, moderate pulmonary hypertension  with PA pressure 53 mmHg.  ASSESSMENT AND PLAN:  1.  Syncope secondary to AV block induced by beta blockers, Catapres, patient also on trazodone and Dilantin which can cause AV nodal blockade.  The AV nodal blockade is not high-grade, carotid massage reveals 1-1 conduction suggesting Wenckebach phenomena on EKG revealing 2: 1 conduction. 2.  Hypertension well-controlled 3.  Acute on chronic diastolic heart failure 4.  Dementia, mild 5.  Diabetes mellitus type 2 controlled with stage III chronic kidney disease  Recommendation: Continue torsemide for CHF, discontinue Catapres, agree with holding metoprolol.  Do not think he needs a pacemaker at this point.  Suspect the syncope was related to underlying conduction system disease induced by medications but in view of his age, he may have AV nodal disease and needs to be monitored for 24 to 48 hours.  He has had asymptomatic Wenckebach phenomena when I had seen him 1.5 years ago.  I will continue to follow.   Adrian Prows, MD 10/01/2017, 7:54 AM [facility] [place] Pager: [phone] Office: [phone] If no answer Cell [phone]

## 2017-10-01 NOTE — Progress Notes (Signed)
[facility] PROGRESS NOTE  Eric [name] PPI:[id number] DOB: 1923/12/27 DOA: 09/29/2017  PCP: [name], MD  Brief History/Interval Summary: 84 year old African-American male with past medical history of chronic kidney disease stage IV, diastolic CHF, essential hypertension, chronic anemia, diabetes mellitus type 2 who was admitted last month for possible seizures and second-degree AV block.  Metoprolol was discontinued.  He presented overnight to the hospital as a result of the syncopal episode.  No mention of seizure type activity.  Patient was hospitalized for further management.  His telemetry again showed second-degree AV block with transient bradycardic episodes.  Patient was seen by cardiology.  They felt it was 2-1 block.  Patient was taken off of his clonidine.  Previously he had been taken off of his beta-blocker.  Reason for Visit: Syncope  Consultants: Cardiology  Procedures: None yet  Antibiotics: Ceftriaxone 6/14  Subjective/Interval History: Patient mildly confused this morning.  Denies any complaints however.    ROS: Denies any headaches.  Objective:  Vital Signs  Vitals:   09/30/17 0521 09/30/17 1254 09/30/17 2010 10/01/17 0510  BP:  (!) 136/58 (!) 157/94 (!) 103/59  Pulse:  (!) 57 (!) 114 (!) 110  Resp:  20 18 18   Temp:  97.6 F (36.4 C) 98.6 F (37 C) 98.4 F (36.9 C)  TempSrc:  Oral Oral Oral  SpO2:  100% 100% 100%  Weight:    69.5 kg (153 lb 4.8 oz)  Height: 5\' 6"  (1.676 m)       Intake/Output Summary (Last 24 hours) at 10/01/2017 1008 Last data filed at 10/01/2017 0915 Gross per 24 hour  Intake 800 ml  Output 250 ml  Net 550 ml   Filed Weights   09/30/17 0437 10/01/17 0510  Weight: 69.5 kg (153 lb 3.2 oz) 69.5 kg (153 lb 4.8 oz)   Telemetry was reviewed.  Noted to be in sinus tachycardia today.  General appearance: Awake alert.  Distracted.  In no distress. Resp: Clear to auscultation bilaterally.  No wheezing rales or  rhonchi. Cardio: S1-S2 is tachycardic regular.  No S3-S4.  No rubs murmurs or bruit. GI: Abdomen is soft.  Nontender nondistended.  Bowel sounds are present.  No masses organomegaly. Extremities: No edema Neurologic: No obvious focal neurological deficits.  Noted to be confused and distracted today.  Lab Results:  Data Reviewed: I have personally reviewed following labs and imaging studies  CBC: Recent Labs  Lab 09/29/17 2233 09/30/17 0130  WBC 5.5 4.2  NEUTROABS 3.9  --   HGB 10.7* 10.7*  HCT 32.8* 32.7*  MCV 101.2* 99.7  PLT 270 063    Basic Metabolic Panel: Recent Labs  Lab 09/29/17 2233 09/30/17 0130 10/01/17 0807  NA 136 133* 138  K 4.4 4.4 4.3  CL 103 103 110  CO2 21* 21* 20*  GLUCOSE 174* 144* 112*  BUN 66* 67* 57*  CREATININE 2.93* 2.85* 2.47*  CALCIUM 9.9 9.5 9.0  MG  --  1.8  --     GFR: Estimated Creatinine Clearance: 17.6 mL/min (A) (by C-G formula based on SCr of 2.47 mg/dL (H)).  Liver Function Tests: Recent Labs  Lab 09/29/17 2233  AST 20  ALT 18  ALKPHOS 133*  BILITOT 0.3  PROT 7.4  ALBUMIN 3.1*    Cardiac Enzymes: Recent Labs  Lab 09/30/17 0130 09/30/17 0658 09/30/17 1232  TROPONINI 0.06* 0.06* 0.06*    Thyroid Function Tests: Recent Labs    09/30/17 0130  TSH 3.975  Recent Results (from the past 240 hour(s))  MRSA PCR Screening     Status: Abnormal   Collection Time: 09/30/17  3:13 AM  Result Value Ref Range Status   MRSA by PCR POSITIVE (A) NEGATIVE Final    Comment:        The GeneXpert MRSA Assay (FDA approved for NASAL specimens only), is one component of a comprehensive MRSA colonization surveillance program. It is not intended to diagnose MRSA infection nor to guide or monitor treatment for MRSA infections. RESULT CALLED TO, READ BACK BY AND VERIFIED WITH: Arlyn Dunning RN, AT [phone] 09/30/17 BY Rush Landmark       Radiology Studies: Dg Chest Portable 1 View  Result Date: 09/29/2017 CLINICAL DATA:  Acute  onset nausea and vomiting, syncopal episode. EXAM: PORTABLE CHEST 1 VIEW COMPARISON:  Chest radiograph Aug 26, 2017 FINDINGS: The cardiac silhouette is mildly enlarged, improved from prior imaging. Calcified aortic knob. Mildly elevated RIGHT hemidiaphragm. Strandy densities LEFT base. No focal consolidation. Biapical thickening. No pneumothorax. Osteopenia. Symmetric calcification likely vascular. IMPRESSION: Mild cardiomegaly.  LEFT lung base atelectasis/scarring. Aortic Atherosclerosis (ICD10-I70.0). Electronically Signed   By: Elon Alas M.D.   On: 09/29/2017 23:02     Medications:  Scheduled: . amLODipine  10 mg Oral QPM  . calcitRIOL  0.25 mcg Oral Daily  . camphor-menthol   Topical BID  . Chlorhexidine Gluconate Cloth  6 each Topical Q0600  . cholecalciferol  2,000 Units Oral Daily  . heparin  5,000 Units Subcutaneous Q8H  . hydrALAZINE  75 mg Oral TID  . insulin aspart  0-9 Units Subcutaneous TID WC  . insulin glargine  18 Units Subcutaneous Daily  . latanoprost  1 drop Both Eyes QHS  . levETIRAcetam  250 mg Oral BID  . mupirocin ointment  1 application Nasal BID  . pantoprazole  40 mg Oral Daily  . pindolol  5 mg Oral BID  . senna  1 tablet Oral Daily   Continuous: . cefTRIAXone (ROCEPHIN)  IV Stopped (09/30/17 1425)   JOA:[id number], ondansetron **OR** ondansetron (ZOFRAN) IV  Assessment/Plan:    Syncope Etiology remains unclear.  Syncope could have been due to significant bradycardia.  No obvious seizure activity was noted.  Patient's Dilantin has been changed over to [place] per discussions with neurology.  Apparently Dilantin can cause significant bradycardia.  Patient continued to have slow heart rate.  There was AV conduction block.  Concern for second-degree.  Seen by cardiology who feels that something came back phenomena.  During his previous hospitalization he was taken off of beta-blocker.  Clonidine has been discontinued as well.  Subsequently patient  became tachycardic.  Today cardiology has placed him on pindolol.  Mild elevation in troponin most likely due to his chronic kidney disease.  Acute on chronic kidney disease stage IV Treatment appears to be around 2.2-2.3.  Noted to be 2.93 at admission with a high BUN as well.  Holding patient's lisinopril.  Patient was given fluid bolus in the emergency department.  Renal function continues to improve.  Recently Lasix was changed over to torsemide by his cardiologist about 2 weeks ago.  Renal function has improved today.  Apparently Lasix was recently changed to torsemide by his cardiologist about 2 weeks ago.  Continue to hold for now.  Monitor urine output.  Acute UTI UA noted to be abnormal.  Patient did mention symptoms suggestive of UTI.  Urine was sent for culture.  Patient placed on ceftriaxone.  Noted to have E.  coli UTI back in April.  He is allergic to penicillin but has tolerated cephalosporins including ceftriaxone previously.  Essential hypertension Blood pressure is reasonably well controlled.  Continue amlodipine and hydralazine.  Clonidine was discontinued.  Pindolol added.    Chronic diastolic CHF Last EF measured was 55 to 60% with grade 1 diastolic dysfunction.  He was somewhat volume depleted at admission.  His diuretics have been held.  He was given fluids in the emergency department.  Seems to be well compensated at this time.  Diabetes mellitus type 2, controlled with renal complications Patient is on Lantus insulin at home.  Monitor CBGs.  Continue SSI.  Watch for hypoglycemia.  Encourage oral intake.  History of seizure disorder/history of dementia Dilantin changed over to [place] per neurology recommendation.  Somewhat confused this morning.  Could be due to hospital stay delirium and UTI could also be contributing.  Anemia likely secondary to chronic kidney disease Hemoglobin is stable and at baseline.  DVT Prophylaxis: Subcutaneous heparin    Code Status: Full  code Family Communication:.  Discussed with the sister who is the healthcare power of attorney. Disposition Plan: Await stabilization in heart rate.  Occupational therapy recommended skilled nursing facility.  Discussed with sister who would prefer to have him back home where he has around-the-clock caregivers.    LOS: 0 days   [facility] Pager [phone] 10/01/2017, 10:08 AM  If 7PM-7AM, please contact night-coverage at [email or web address], password [facility]

## 2017-10-01 NOTE — Progress Notes (Signed)
PT Cancellation Note  Patient Details Name: Eric [name] MRN: [id number] DOB: Mar 09, 1924   Cancelled Treatment:     Attempted to work with patient, RN reports just moved pt back to bed after being in chair all day and he is tired, may want to check back. PT will cont to follow.   Reinaldo Berber, PT, DPT Acute Rehab Services Pager: [phone]     Reinaldo Berber 10/01/2017, 4:59 PM

## 2017-10-01 NOTE — Progress Notes (Signed)
Subjective:  No complaints. No seizure or syncope while in the hospital  Objective:  Vital Signs in the last 24 hours: Temp:  [97.6 F (36.4 C)-98.6 F (37 C)] 98.4 F (36.9 C) (06/15 0510) Pulse Rate:  [57-114] 110 (06/15 0510) Resp:  [18-20] 18 (06/15 0510) BP: (103-157)/(58-94) 103/59 (06/15 0510) SpO2:  [100 %] 100 % (06/15 0510) Weight:  [69.5 kg (153 lb 4.8 oz)] 69.5 kg (153 lb 4.8 oz) (06/15 0510)  Intake/Output from previous day: 06/14 0701 - 06/15 0700 In: 920 [P.O.:720; IV Piggyback:200] Out: 250 [Urine:250]  Physical Exam:  General appearance: alert, cooperative, appears stated age and no distress Eyes: negative findings: lids and lashes normal Neck: no adenopathy, no carotid bruit, no JVD, supple, symmetrical, trachea midline and thyroid not enlarged, symmetric, no tenderness/mass/nodules Neck: JVP - normal, carotids 2+= without bruits Resp: Right base faint crackles Chest wall: no tenderness Cardio: regular rate and rhythm, S1, S2 normal, no murmur, click, rub or gallop and Tachycardia present GI: soft, non-tender; bowel sounds normal; no masses,  no organomegaly Extremities: extremities normal, atraumatic, no cyanosis or edema    Lab Results: BMP Recent Labs    08/29/17 0225 09/29/17 2233 09/30/17 0130  NA 138 136 133*  K 3.7 4.4 4.4  CL 104 103 103  CO2 25 21* 21*  GLUCOSE 123* 174* 144*  BUN 31* 66* 67*  CREATININE 2.17* 2.93* 2.85*  CALCIUM 9.1 9.9 9.5  GFRNONAA 25* 17* 18*  GFRAA 29* 20* 21*    CBC Recent Labs  Lab 09/29/17 2233 09/30/17 0130  WBC 5.5 4.2  RBC 3.24* 3.28*  HGB 10.7* 10.7*  HCT 32.8* 32.7*  PLT 270 232  MCV 101.2* 99.7  MCH 33.0 32.6  MCHC 32.6 32.7  RDW 12.2 12.0  LYMPHSABS 1.2  --   MONOABS 0.2  --   EOSABS 0.2  --   BASOSABS 0.0  --     HEMOGLOBIN A1C Lab Results  Component Value Date   HGBA1C 6.0 (H) 08/27/2017   MPG 125.5 08/27/2017    Cardiac Panel (last 3 results) Recent Labs    09/30/17 0130  09/30/17 0658 09/30/17 1232  TROPONINI 0.06* 0.06* 0.06*    BNP (last 3 results) No results for input(s): PROBNP in the last 8760 hours.  TSH Recent Labs    08/27/17 0043 09/30/17 0130  TSH 3.008 3.975    Lipid Panel     Component Value Date/Time   CHOL 159 11/10/2014 0855   TRIG 112 11/10/2014 0855   HDL 31 (L) 11/10/2014 0855   CHOLHDL 5.1 11/10/2014 0855   VLDL 22 11/10/2014 0855   LDLCALC 106 (H) 11/10/2014 0855   LDLDIRECT 40.5 10/28/2006 1012     Hepatic Function Panel Recent Labs    08/26/17 1453 08/27/17 0630 09/29/17 2233  PROT 6.6 6.2* 7.4  ALBUMIN 2.7* 2.6* 3.1*  AST 25 23 20   ALT 26 24 18   ALKPHOS 123 113 133*  BILITOT 0.6 0.4 0.3    Imaging: Dg Chest Portable 1 View  Result Date: 09/29/2017 CLINICAL DATA:  Acute onset nausea and vomiting, syncopal episode. EXAM: PORTABLE CHEST 1 VIEW COMPARISON:  Chest radiograph Aug 26, 2017 FINDINGS: The cardiac silhouette is mildly enlarged, improved from prior imaging. Calcified aortic knob. Mildly elevated RIGHT hemidiaphragm. Strandy densities LEFT base. No focal consolidation. Biapical thickening. No pneumothorax. Osteopenia. Symmetric calcification likely vascular. IMPRESSION: Mild cardiomegaly.  LEFT lung base atelectasis/scarring. Aortic Atherosclerosis (ICD10-I70.0). Electronically Signed   By: Elon Alas  M.D.   On: 09/29/2017 23:02   CARDIAC STUDIES:  EKG/Telemetry 09/30/2017: Underlying sinus rhythm with 2: 1 AV conduction, ventricular rate 44 bpm, otherwise normal QRS, normal QT interval.  Telemetry reveals episodes of Wenckebach, episodes mostly 2: 1 AV conduction with underlying sinus rhythm with marked bradycardia.  Carotid sinus massage: Induction of bradycardia to a rate of 39 to 40 bpm with resolution of 2: 1 conduction.  Echocardiogram 08/29/2017: Normal LV systolic function, mild LVH, EF 55 to 68%, mild diastolic dysfunction, moderately calcified mitral valve, moderate pulmonary  hypertension with PA pressure 53 mmHg.  Scheduled Meds: . amLODipine  10 mg Oral QPM  . calcitRIOL  0.25 mcg Oral Daily  . camphor-menthol   Topical BID  . Chlorhexidine Gluconate Cloth  6 each Topical Q0600  . cholecalciferol  2,000 Units Oral Daily  . heparin  5,000 Units Subcutaneous Q8H  . hydrALAZINE  75 mg Oral TID  . insulin aspart  0-9 Units Subcutaneous TID WC  . insulin glargine  18 Units Subcutaneous Daily  . latanoprost  1 drop Both Eyes QHS  . mupirocin ointment  1 application Nasal BID  . pantoprazole  40 mg Oral Daily  . pindolol  5 mg Oral BID  . senna  1 tablet Oral Daily   Continuous Infusions: . cefTRIAXone (ROCEPHIN)  IV Stopped (09/30/17 1425)  . levETIRAcetam Stopped (09/30/17 2250)   PRN Meds:.hydrALAZINE, ondansetron **OR** ondansetron (ZOFRAN) IV   ASSESSMENT AND PLAN:  1.  Syncope secondary to AV block induced by beta blockers, Catapres, patient also on trazodone and Dilantin which can cause AV nodal blockade.  The AV nodal blockade is not high-grade, carotid massage reveals 1-1 conduction suggesting Wenckebach phenomena on EKG revealing 2: 1 conduction.  Today he is tachycardic with sinus tachycardia. 2.  Hypertension well-controlled 3.  Acute on chronic diastolic heart failure 4.  Dementia, mild 5.  Diabetes mellitus type 2 controlled with stage III chronic kidney disease  Recommendation: I will start the patient on pindolol 5 mg p.o. twice daily.  He is got tachycardia related to holding his beta-blocker and also Catapres. CHF appears to be stable.  He was laying down flat in bed without any distress.  He is on chronic diuretic therapy, would hold off diuresis for now and use torsemide 20 mg PRN for leg edema or dyspnea.  Has mild dementia which is stable and diabetes is controlled.  We will see him back on a as needed basis.  Adrian Prows, M.D. 10/01/2017, 8:07 AM [facility] Pager: [phone] Office: [phone] If no answer:  [phone]

## 2017-10-01 NOTE — Evaluation (Signed)
Occupational Therapy Evaluation Patient Details Name: Eric [name] MRN: [id number] DOB: August 16, 1923 Today's Date: 10/01/2017    History of Present Illness Pt is a 84 y.o. male with history of CDK stage IV, diastolic CHF, HTN, chronic anemia, DM2. Pt was admitted last month for possible seizures and second-degree AV block. Pt presenting to ED after syncopal episode at home.    Clinical Impression   Unsure of PLOF; pt reports he was independent with ADL PTA but chart review from previous admissions state otherwise. Currently pt requires mod-max assist for ADL and mod assist for stand pivot transfer with significant bil knee buckling in standing (R>L). Recommending SNF for follow up to maximize independence and safety with ADL and functional mobility prior to return home. Pt would benefit from continued skilled OT to address established goals.  Acknowledge case manager note that pt is active with [facility]; receiving [facility] services and has caregiver 24/7. If caregiver able to provide current level of assist for pt and 24/7 assist; HHOT may be appropriate.     Follow Up Recommendations  SNF;Supervision/Assistance - 24 hour    Equipment Recommendations  Other (comment)(TBD)    Recommendations for Other Services PT consult     Precautions / Restrictions Precautions Precautions: Fall Restrictions Weight Bearing Restrictions: No      Mobility Bed Mobility Overal bed mobility: Needs Assistance Bed Mobility: Supine to Sit     Supine to sit: Min guard     General bed mobility comments: HOB flat without use of bed rails. Increased time and effort. Min guard for safety.  Transfers Overall transfer level: Needs assistance Equipment used: Rolling walker (2 wheeled) Transfers: Sit to/from [facility] Sit to Stand: Min assist Stand pivot transfers: Mod assist       General transfer comment: Min assist to boost up from EOB, mod assist to transfer due to bil knee buckling  R>L. Good hand placement and technqiue with RW.    Balance Overall balance assessment: Needs assistance Sitting-balance support: Feet supported Sitting balance-Leahy Scale: Good     Standing balance support: Bilateral upper extremity supported Standing balance-Leahy Scale: Poor Standing balance comment: RW for support                           ADL either performed or assessed with clinical judgement   ADL Overall ADL's : Needs assistance/impaired Eating/Feeding: Minimal assistance;Sitting   Grooming: Minimal assistance;Sitting   Upper Body Bathing: Moderate assistance;Sitting   Lower Body Bathing: Maximal assistance;Sit to/from stand   Upper Body Dressing : Moderate assistance;Sitting   Lower Body Dressing: Maximal assistance;Sit to/from stand   Toilet Transfer: Moderate assistance;Stand-pivot;RW Toilet Transfer Details (indicate cue type and reason): Simulated by stand pivot EOB to chair; bil knee buckling throughout R>L         Functional mobility during ADLs: Moderate assistance;Rolling walker(for stand pivot only)       Vision         Perception     Praxis      Pertinent Vitals/Pain Pain Assessment: No/denies pain     Hand Dominance     Extremity/Trunk Assessment Upper Extremity Assessment Upper Extremity Assessment: Generalized weakness   Lower Extremity Assessment Lower Extremity Assessment: Defer to PT evaluation   Cervical / Trunk Assessment Cervical / Trunk Assessment: Kyphotic   Communication Communication Communication: No difficulties   Cognition Arousal/Alertness: Lethargic Behavior During Therapy: Flat affect Overall Cognitive Status: No family/caregiver present to determine baseline  cognitive functioning                                 General Comments: Pt only oriented to self. Follows one step commands consistently but appears confused when answering questions regarding home set up and PLOF.   General  Comments       Exercises     Shoulder Instructions      Home Living Family/patient expects to be discharged to:: Private residence Living Arrangements: Alone   Type of Home: House                           Additional Comments: Pt reporting he lives at home alone without assist. Per chart review pt has 24/7 caregivers.      Prior Functioning/Environment          Comments: Pt reports he is independent with ADL and thinks he uses a RW for mobility. From previous admissions, caregivers help with ADL.        OT Problem List: Decreased strength;Decreased activity tolerance;Impaired balance (sitting and/or standing);Decreased cognition;Decreased safety awareness;Decreased knowledge of use of DME or AE      OT Treatment/Interventions: Self-care/ADL training;Therapeutic exercise;Energy conservation;DME and/or AE instruction;Therapeutic activities;Cognitive remediation/compensation;Patient/family education;Balance training    OT Goals(Current goals can be found in the care plan section) Acute Rehab OT Goals Patient Stated Goal: none stated OT Goal Formulation: With patient Time For Goal Achievement: 10/15/17 Potential to Achieve Goals: Good ADL Goals Pt Will Perform Upper Body Bathing: with supervision;sitting Pt Will Perform Lower Body Bathing: with min assist;sit to/from stand Pt Will Transfer to Toilet: with min assist;ambulating;bedside commode Pt Will Perform Toileting - Clothing Manipulation and hygiene: with min assist;sit to/from stand  OT Frequency: Min 2X/week   Barriers to D/C:            Co-evaluation              AM-PAC PT "6 Clicks" Daily Activity     Outcome Measure Help from another person eating meals?: A Little Help from another person taking care of personal grooming?: A Little Help from another person toileting, which includes using toliet, bedpan, or urinal?: A Lot Help from another person bathing (including washing, rinsing,  drying)?: A Lot Help from another person to put on and taking off regular upper body clothing?: A Lot Help from another person to put on and taking off regular lower body clothing?: A Lot 6 Click Score: 14   End of Session Equipment Utilized During Treatment: Surveyor, mining Communication: Mobility status  Activity Tolerance: Patient limited by lethargy Patient left: in chair;with call bell/phone within reach;with chair alarm set  OT Visit Diagnosis: Unsteadiness on feet (R26.81);Other abnormalities of gait and mobility (R26.89);Muscle weakness (generalized) (M62.81)                Time: 1610-9604 OT Time Calculation (min): 15 min Charges:  OT General Charges $OT Visit: 1 Visit OT Evaluation $OT Eval Moderate Complexity: 1 Mod G-Codes:     Eric [name], M.S., OTR/L Acute Rehab Department: [phone]  Binnie Kand 10/01/2017, 8:36 AM

## 2017-10-02 DIAGNOSIS — N183 Chronic kidney disease, stage 3 (moderate): Secondary | ICD-10-CM | POA: Diagnosis not present

## 2017-10-02 DIAGNOSIS — I5032 Chronic diastolic (congestive) heart failure: Secondary | ICD-10-CM | POA: Diagnosis not present

## 2017-10-02 DIAGNOSIS — R55 Syncope and collapse: Secondary | ICD-10-CM | POA: Diagnosis not present

## 2017-10-02 DIAGNOSIS — E1122 Type 2 diabetes mellitus with diabetic chronic kidney disease: Secondary | ICD-10-CM | POA: Diagnosis not present

## 2017-10-02 LAB — BASIC METABOLIC PANEL
Anion gap: 11 (ref 5–15)
BUN: 63 mg/dL — ABNORMAL HIGH (ref 6–20)
CO2: 19 mmol/L — AB (ref 22–32)
CREATININE: 2.78 mg/dL — AB (ref 0.61–1.24)
Calcium: 8.9 mg/dL (ref 8.9–10.3)
Chloride: 104 mmol/L (ref 101–111)
GFR calc non Af Amer: 18 mL/min — ABNORMAL LOW (ref >60)
GFR, EST AFRICAN AMERICAN: 21 mL/min — AB (ref >60)
Glucose, Bld: 114 mg/dL — ABNORMAL HIGH (ref 65–99)
Potassium: 4.1 mmol/L (ref 3.5–5.1)
Sodium: 134 mmol/L — ABNORMAL LOW (ref 135–145)

## 2017-10-02 LAB — GLUCOSE, CAPILLARY
GLUCOSE-CAPILLARY: 156 mg/dL — AB (ref 65–99)
GLUCOSE-CAPILLARY: 214 mg/dL — AB (ref 65–99)
Glucose-Capillary: 110 mg/dL — ABNORMAL HIGH (ref 65–99)
Glucose-Capillary: 182 mg/dL — ABNORMAL HIGH (ref 65–99)

## 2017-10-02 LAB — URINE CULTURE

## 2017-10-02 MED ORDER — CEPHALEXIN 500 MG PO CAPS
500.0000 mg | ORAL_CAPSULE | Freq: Two times a day (BID) | ORAL | Status: DC
  Administered 2017-10-02 – 2017-10-03 (×2): 500 mg via ORAL
  Filled 2017-10-02 (×2): qty 1

## 2017-10-02 NOTE — Progress Notes (Signed)
[facility] PROGRESS NOTE  Eric [name] JJO:[id number] DOB: [birth year] DOA: 09/29/2017  PCP: [name], MD  Brief History/Interval Summary: 84 year old African-American male with past medical history of chronic kidney disease stage IV, diastolic CHF, essential hypertension, chronic anemia, diabetes mellitus type 2 who was admitted last month for possible seizures and second-degree AV block.  Metoprolol was discontinued.  He presented overnight to the hospital as a result of the syncopal episode.  No mention of seizure type activity.  Patient was hospitalized for further management.  His telemetry again showed second-degree AV block with transient bradycardic episodes.  Patient was seen by cardiology.  They felt it was 2-1 block.  Patient was taken off of his clonidine.  Previously he had been taken off of his beta-blocker.  Reason for Visit: Syncope  Consultants: Cardiology  Procedures: None yet  Antibiotics: Ceftriaxone 6/14-6/16 Keflex 6/16  Subjective/Interval History: Patient feeling better this morning.  Complains of itchy sensation on his back.  No shortness of breath or chest pain.  ROS: Denies any headaches  Objective:  Vital Signs  Vitals:   10/01/17 1240 10/01/17 2127 10/02/17 0451 10/02/17 0512  BP: (!) 121/58 133/68  (!) 120/52  Pulse: (!) 54 (!) 101  100  Resp: 20 18  18   Temp: 98.7 F (37.1 C) 99.2 F (37.3 C)  98.8 F (37.1 C)  TempSrc: Oral Oral  Oral  SpO2: (!) 78% 98%  97%  Weight:   70.3 kg (155 lb)   Height:        Intake/Output Summary (Last 24 hours) at 10/02/2017 0930 Last data filed at 10/02/2017 0600 Gross per 24 hour  Intake 700 ml  Output 150 ml  Net 550 ml   Filed Weights   09/30/17 0437 10/01/17 0510 10/02/17 0451  Weight: 69.5 kg (153 lb 3.2 oz) 69.5 kg (153 lb 4.8 oz) 70.3 kg (155 lb)   Telemetry shows the patient is in sinus rhythm in the 80s and 90s.  Heart rate has improved.  General appearance: Awake alert.   Distracted. Resp: Clear to auscultation bilaterally.  No wheezing rales or rhonchi Cardio: S1-S2 is normal regular.  No S3-S4.  No rubs murmurs or bruit GI: Soft.  Nontender nondistended.  Bowel sounds are present.  No masses organomegaly Skin: No rashes noted on the back area.  No lesions identified.  Dry skin noted. Extremities: No edema Neurologic: Distracted.  Less confused today compared to yesterday.  No obvious focal neurological deficits.  Lab Results:  Data Reviewed: I have personally reviewed following labs and imaging studies  CBC: Recent Labs  Lab 09/29/17 2233 09/30/17 0130  WBC 5.5 4.2  NEUTROABS 3.9  --   HGB 10.7* 10.7*  HCT 32.8* 32.7*  MCV 101.2* 99.7  PLT 270 160    Basic Metabolic Panel: Recent Labs  Lab 09/29/17 2233 09/30/17 0130 10/01/17 0807 10/02/17 0703  NA 136 133* 138 134*  K 4.4 4.4 4.3 4.1  CL 103 103 110 104  CO2 21* 21* 20* 19*  GLUCOSE 174* 144* 112* 114*  BUN 66* 67* 57* 63*  CREATININE 2.93* 2.85* 2.47* 2.78*  CALCIUM 9.9 9.5 9.0 8.9  MG  --  1.8  --   --     GFR: Estimated Creatinine Clearance: 15.6 mL/min (A) (by C-G formula based on SCr of 2.78 mg/dL (H)).  Liver Function Tests: Recent Labs  Lab 09/29/17 2233  AST 20  ALT 18  ALKPHOS 133*  BILITOT 0.3  PROT 7.4  ALBUMIN 3.1*    Cardiac Enzymes: Recent Labs  Lab 09/30/17 0130 09/30/17 0658 09/30/17 1232  TROPONINI 0.06* 0.06* 0.06*    Thyroid Function Tests: Recent Labs    09/30/17 0130  TSH 3.975     Recent Results (from the past 240 hour(s))  MRSA PCR Screening     Status: Abnormal   Collection Time: 09/30/17  3:13 AM  Result Value Ref Range Status   MRSA by PCR POSITIVE (A) NEGATIVE Final    Comment:        The GeneXpert MRSA Assay (FDA approved for NASAL specimens only), is one component of a comprehensive MRSA colonization surveillance program. It is not intended to diagnose MRSA infection nor to guide or monitor treatment for MRSA  infections. RESULT CALLED TO, READ BACK BY AND VERIFIED WITH: Arlyn Dunning RN, AT [phone] 09/30/17 BY D. VANHOOK   Culture, Urine     Status: Abnormal   Collection Time: 09/30/17 12:12 PM  Result Value Ref Range Status   Specimen Description URINE, RANDOM  Final   Special Requests   Final    NONE Performed at [facility], [place] [address] [place], [place] [address]    Culture >=100,000 COLONIES/mL PROTEUS MIRABILIS (A)  Final   Report Status 10/02/2017 FINAL  Final   Organism ID, Bacteria PROTEUS MIRABILIS (A)  Final      Susceptibility   Proteus mirabilis - MIC*    AMPICILLIN <=2 SENSITIVE Sensitive     CEFAZOLIN <=4 SENSITIVE Sensitive     CEFTRIAXONE <=1 SENSITIVE Sensitive     CIPROFLOXACIN <=0.25 SENSITIVE Sensitive     GENTAMICIN <=1 SENSITIVE Sensitive     IMIPENEM 8 INTERMEDIATE Intermediate     NITROFURANTOIN 128 RESISTANT Resistant     TRIMETH/SULFA <=20 SENSITIVE Sensitive     AMPICILLIN/SULBACTAM <=2 SENSITIVE Sensitive     PIP/TAZO <=4 SENSITIVE Sensitive     * >=100,000 COLONIES/mL PROTEUS MIRABILIS      Radiology Studies: No results found.   Medications:  Scheduled: . amLODipine  10 mg Oral QPM  . calcitRIOL  0.25 mcg Oral Daily  . camphor-menthol   Topical BID  . Chlorhexidine Gluconate Cloth  6 each Topical Q0600  . cholecalciferol  2,000 Units Oral Daily  . heparin  5,000 Units Subcutaneous Q8H  . hydrALAZINE  75 mg Oral TID  . insulin aspart  0-9 Units Subcutaneous TID WC  . insulin glargine  18 Units Subcutaneous Daily  . latanoprost  1 drop Both Eyes QHS  . levETIRAcetam  250 mg Oral BID  . mupirocin ointment  1 application Nasal BID  . pantoprazole  40 mg Oral Daily  . pindolol  5 mg Oral BID  . senna  1 tablet Oral Daily   Continuous: . cefTRIAXone (ROCEPHIN)  IV Stopped (10/01/17 1047)   QBH:[id number], ondansetron **OR** ondansetron (ZOFRAN) IV  Assessment/Plan:    Syncope Syncope could have been due to significant bradycardia.   No obvious seizure activity was noted.  Patient's Dilantin has been changed over to [place] per discussions with neurology.  Apparently Dilantin can cause significant bradycardia.  Patient continued to have slow heart rate.  There was AV conduction block.  Concern for second-degree.  Seen by cardiology who feels that this is 2-1 block, Wenckebach. During his previous hospitalization he was taken off of beta-blocker.  Clonidine has been discontinued as well.  Subsequently patient became tachycardic.  On 6/15 patient was placed on pindolol.  Heart rate seems to have  improved over the last 24 hours.  Await further cardiology input.  Mild elevation in troponin most likely due to his chronic kidney disease.  Acute on chronic kidney disease stage IV Baseline creatinine appears to be around 2.2-2.3.  Noted to be 2.93 at admission with a high BUN as well.  Holding patient's lisinopril.  Patient was given fluid bolus in the emergency department.  Renal function did improve.  Creatinine noted to be 2.7 today.  He has been making urine but has had few episodes of incontinence.  This could be his new baseline.  He supposedly had an appointment to see a nephrologist on Thursday or Friday but he was in the hospital.  Discussed with the sister yesterday.  Told her to make another appointment for him to see his nephrologist.  No need for inpatient consultation.  His Lasix was changed over to torsemide by his cardiologist about 2 weeks ago.  Noted to have mild weight gain over the last 2 days.  Recheck renal function and weight tomorrow.  Could place him back on torsemide at discharge.    Acute UTI with Proteus UA noted to be abnormal.  Patient did mention symptoms suggestive of UTI.  Urine was sent for culture. He is allergic to penicillin but has tolerated cephalosporins including ceftriaxone previously. Patient placed on ceftriaxone.  Noted to have E. coli UTI back in April.  Current cultures growing Proteus.  Sensitivities  reviewed.  After today's dose of ceftriaxone he can be placed on Keflex.  Essential hypertension Blood pressure is reasonably well controlled.  Continue amlodipine and hydralazine.  Clonidine was discontinued.  Pindolol added.    Chronic diastolic CHF Last EF measured was 55 to 60% with grade 1 diastolic dysfunction.  He was somewhat volume depleted at admission.  His diuretics have been held.  He was given fluids in the emergency department.  He has gained some weight today.  May need to resume his torsemide.  Will determine tomorrow.  Diabetes mellitus type 2, controlled with renal complications Patient is on Lantus insulin at home.  Monitor CBGs.  Continue SSI.  Watch for hypoglycemia.  Encourage oral intake.  History of seizure disorder/history of dementia Dilantin changed over to [place] per neurology recommendation.  He was confused on 6/15.  Likely due to the hospital stay and new environment.  Mental status is stable this morning.   Anemia likely secondary to chronic kidney disease Hemoglobin is stable and at baseline.  DVT Prophylaxis: Subcutaneous heparin    Code Status: Full code Family Communication:.  Discussed with the sister who is the healthcare power of attorney. Disposition Plan: Heart rate has improved.  Await further input from cardiology.  Occupational therapy recommended skilled nursing facility.  Discussed with sister who would prefer to have him back home where he has around-the-clock caregivers.  Hopefully discharge on 6/17.    LOS: 0 days   [facility] Pager [phone] 10/02/2017, 9:30 AM  If 7PM-7AM, please contact night-coverage at [email or web address], password [facility]

## 2017-10-02 NOTE — Evaluation (Signed)
Physical Therapy Evaluation Patient Details Name: Eric [name] MRN: [id number] DOB: [birth year] Today's Date: 10/02/2017   History of Present Illness  Pt is a 84 y.o. male with history of CDK stage IV, diastolic CHF, HTN, chronic anemia, DM2. Pt was admitted last month for possible seizures and second-degree AV block. Pt presenting to ED after syncopal episode at home.     Clinical Impression  Pt admitted with above diagnosis. Pt currently with functional limitations due to the deficits listed below (see PT Problem List). Pt unreliable historian with hx of dementia, per chart PT living at home with 24/7 caregivers and home therapy services. Upon eval pt presents with generalized weakness and deconditioning. Patient requires moderate assistance to stand and unable to safely take steps this visit due to weakness in legs. Rec SNF to improve mobility, no family present to discuss level of support and comfort with patients safety at home. Agree with OT that home with 24/7 support would be appropriate with HHPT if that  can be confirmed, as patient is a high fall risk.  Pt will benefit from skilled PT to increase their independence and safety with mobility to allow discharge to the venue listed below.       Follow Up Recommendations SNF;Supervision/Assistance - 24 hour    Equipment Recommendations  (TBD next venue)    Recommendations for Other Services       Precautions / Restrictions Precautions Precautions: Fall Restrictions Weight Bearing Restrictions: No      Mobility  Bed Mobility Overal bed mobility: Needs Assistance             General bed mobility comments: In chair at entry  Transfers Overall transfer level: Needs assistance Equipment used: Rolling walker (2 wheeled) Transfers: Sit to/from Stand Sit to Stand: Mod assist Stand pivot transfers: Mod assist       General transfer comment: Mod A to assist from chair today, patient unable to take steps due to knee  buckling, collapses back into chair x2 trials.   Ambulation/Gait             General Gait Details: defered due to safety/weakness  Stairs            Wheelchair Mobility    Modified Rankin (Stroke Patients Only)       Balance Overall balance assessment: Needs assistance Sitting-balance support: Feet supported Sitting balance-Leahy Scale: Good     Standing balance support: Bilateral upper extremity supported Standing balance-Leahy Scale: Poor Standing balance comment: RW for support                             Pertinent Vitals/Pain Pain Assessment: No/denies pain    Home Living Family/patient expects to be discharged to:: Private residence Living Arrangements: Alone   Type of Home: House           Additional Comments: Pt reporting he lives at home alone without assist. Per chart review pt has 24/7 caregivers.    Prior Function           Comments: unreliable historian, mixed reports from patient, hx of dementia. Pt reports today that he lives alone and walks with RW.     Hand Dominance        Extremity/Trunk Assessment   Upper Extremity Assessment Upper Extremity Assessment: Defer to OT evaluation    Lower Extremity Assessment Lower Extremity Assessment: Generalized weakness    Cervical / Trunk Assessment Cervical / Trunk Assessment:  Kyphotic  Communication   Communication: No difficulties  Cognition Arousal/Alertness: Awake/alert Behavior During Therapy: Flat affect Overall Cognitive Status: No family/caregiver present to determine baseline cognitive functioning                                 General Comments: Pt only oriented to self. Follows one step commands consistently but appears confused when answering questions regarding home set up and PLOF.      General Comments      Exercises     Assessment/Plan    PT Assessment Patient needs continued PT services  PT Problem List Decreased  strength;Decreased activity tolerance;Decreased balance;Decreased mobility;Decreased coordination;Decreased range of motion;Decreased cognition;Decreased knowledge of use of DME;Decreased safety awareness       PT Treatment Interventions DME instruction;Gait training;Stair training;Therapeutic exercise;Therapeutic activities;Functional mobility training;Balance training    PT Goals (Current goals can be found in the Care Plan section)  Acute Rehab PT Goals Patient Stated Goal: none stated PT Goal Formulation: With patient Time For Goal Achievement: 10/09/17 Potential to Achieve Goals: Fair    Frequency Min 2X/week   Barriers to discharge        Co-evaluation               AM-PAC PT "6 Clicks" Daily Activity  Outcome Measure Difficulty turning over in bed (including adjusting bedclothes, sheets and blankets)?: Unable Difficulty moving from lying on back to sitting on the side of the bed? : Unable Difficulty sitting down on and standing up from a chair with arms (e.g., wheelchair, bedside commode, etc,.)?: Unable Help needed moving to and from a bed to chair (including a wheelchair)?: A Lot Help needed walking in hospital room?: A Lot Help needed climbing 3-5 steps with a railing? : Total 6 Click Score: 8    End of Session Equipment Utilized During Treatment: Gait belt Activity Tolerance: Patient limited by fatigue Patient left: in chair;with call bell/phone within reach;with nursing/sitter in room Nurse Communication: Mobility status PT Visit Diagnosis: Unsteadiness on feet (R26.81)    Time: 5638-9373 PT Time Calculation (min) (ACUTE ONLY): 20 min   Charges:   PT Evaluation $PT Eval Low Complexity: 1 Low     PT G Codes:       Reinaldo Berber, PT, DPT Acute Rehab Services Pager: [phone]   Reinaldo Berber 10/02/2017, 10:36 AM

## 2017-10-03 DIAGNOSIS — R55 Syncope and collapse: Secondary | ICD-10-CM | POA: Diagnosis not present

## 2017-10-03 LAB — BASIC METABOLIC PANEL
ANION GAP: 8 (ref 5–15)
BUN: 63 mg/dL — ABNORMAL HIGH (ref 6–20)
CALCIUM: 9.2 mg/dL (ref 8.9–10.3)
CO2: 21 mmol/L — ABNORMAL LOW (ref 22–32)
Chloride: 107 mmol/L (ref 101–111)
Creatinine, Ser: 2.62 mg/dL — ABNORMAL HIGH (ref 0.61–1.24)
GFR, EST AFRICAN AMERICAN: 23 mL/min — AB (ref >60)
GFR, EST NON AFRICAN AMERICAN: 20 mL/min — AB (ref >60)
Glucose, Bld: 160 mg/dL — ABNORMAL HIGH (ref 65–99)
POTASSIUM: 4.2 mmol/L (ref 3.5–5.1)
SODIUM: 136 mmol/L (ref 135–145)

## 2017-10-03 LAB — GLUCOSE, CAPILLARY
GLUCOSE-CAPILLARY: 565 mg/dL — AB (ref 65–99)
GLUCOSE-CAPILLARY: 127 mg/dL — AB (ref 65–99)
Glucose-Capillary: 123 mg/dL — ABNORMAL HIGH (ref 65–99)

## 2017-10-03 MED ORDER — PINDOLOL 5 MG PO TABS: 5.0000 mg | ORAL_TABLET | Freq: Two times a day (BID) | ORAL | 0 refills | Status: AC

## 2017-10-03 MED ORDER — CEPHALEXIN 500 MG PO CAPS: 500.0000 mg | ORAL_CAPSULE | Freq: Two times a day (BID) | ORAL | 0 refills | Status: AC

## 2017-10-03 MED ORDER — LEVETIRACETAM 250 MG PO TABS: 250.0000 mg | ORAL_TABLET | Freq: Two times a day (BID) | ORAL | 1 refills | Status: DC

## 2017-10-03 NOTE — Progress Notes (Signed)
Occupational Therapy Treatment Patient Details Name: Eric [name] MRN: [id number] DOB: 01/04/1924 Today's Date: 10/03/2017    History of present illness Pt is a 84 y.o. male with history of CDK stage IV, diastolic CHF, HTN, chronic anemia, DM2. Pt was admitted last month for possible seizures and second-degree AV block. Pt presenting to ED after syncopal episode at home.    OT comments  Pt progressing towards established OT goals. Pt donning UB clothing with Mod A and LB clothing with Max A. Pt presenting with poor problem solving and sequencing and required increased cues for donning shirt and pants. Pt presenting with decrease standing balance and requires Max A for posterior lean. Pt only oriented to self and asking "how will I get to [place] from [place]." [place] for [place] home with [facility] for [facility]. Continue to recommend post-acute OT and 24 hour supervision. Will continue to follow acutely as admitted.   Follow Up Recommendations  SNF;Supervision/Assistance - 24 hour    Equipment Recommendations  Wheelchair (measurements OT);Wheelchair cushion (measurements OT)    Recommendations for Other Services PT consult    Precautions / Restrictions Precautions Precautions: Fall Restrictions Weight Bearing Restrictions: No       Mobility Bed Mobility               General bed mobility comments: In chair at entry  Transfers Overall transfer level: Needs assistance Equipment used: Rolling walker (2 wheeled) Transfers: Sit to/from Stand Sit to Stand: +2 safety/equipment;Min assist         General transfer comment: Min A for power up into standing and then gain balance. Requiring Max A for standing balance during dynamic balance    Balance Overall balance assessment: Needs assistance Sitting-balance support: Feet supported Sitting balance-Leahy Scale: Good     Standing balance support: Bilateral upper extremity supported Standing balance-Leahy Scale:  Poor Standing balance comment: RW for support and physical A                           ADL either performed or assessed with clinical judgement   ADL Overall ADL's : Needs assistance/impaired                 Upper Body Dressing : Moderate assistance;Sitting Upper Body Dressing Details (indicate cue type and reason): Pt donning shirt with Mod A for head management and shirt positioning. Pt with poor sequencing and problem solving.  Lower Body Dressing: Maximal assistance;Sit to/from stand;+2 for safety/equipment Lower Body Dressing Details (indicate cue type and reason): Pt donning pants with Mod A for donning over feet due to poor problem solving and sequencing. Requiring Max A for standing balance during standing with posterior leaning throughout pulling pants over hips               General ADL Comments: Pt donning shirt and pants with Mod-Max A in prepration for dc today. Pt requiring Max cues for sequencing, problem solving, and safey. Pt with posterior lean in standing requiring Max A to maintain standing balance. +2 A for safety     Vision       Perception     Praxis      Cognition Arousal/Alertness: Awake/alert Behavior During Therapy: Flat affect Overall Cognitive Status: No family/caregiver present to determine baseline cognitive functioning  General Comments: Pt only oriented to self; asking if he is in [place]. Follows one step commands consistently.        Exercises     Shoulder Instructions       General Comments      Pertinent Vitals/ Pain       Pain Assessment: No/denies pain  Home Living                                          Prior Functioning/Environment              Frequency  Min 2X/week        Progress Toward Goals  OT Goals(current goals can now be found in the care plan section)  Progress towards OT goals: Progressing toward goals  Acute  Rehab OT Goals Patient Stated Goal: none stated OT Goal Formulation: With patient Time For Goal Achievement: 10/15/17 Potential to Achieve Goals: Good ADL Goals Pt Will Perform Upper Body Bathing: with supervision;sitting Pt Will Perform Lower Body Bathing: with min assist;sit to/from stand Pt Will Transfer to Toilet: with min assist;ambulating;bedside commode Pt Will Perform Toileting - Clothing Manipulation and hygiene: with min assist;sit to/from stand  Plan Discharge plan remains appropriate;Equipment recommendations need to be updated    Co-evaluation                 AM-PAC PT "6 Clicks" Daily Activity     Outcome Measure   Help from another person eating meals?: A Little Help from another person taking care of personal grooming?: A Little Help from another person toileting, which includes using toliet, bedpan, or urinal?: A Lot Help from another person bathing (including washing, rinsing, drying)?: A Lot Help from another person to put on and taking off regular upper body clothing?: A Lot Help from another person to put on and taking off regular lower body clothing?: A Lot 6 Click Score: 14    End of Session Equipment Utilized During Treatment: Rolling walker;Gait belt  OT Visit Diagnosis: Unsteadiness on feet (R26.81);Other abnormalities of gait and mobility (R26.89);Muscle weakness (generalized) (M62.81)   Activity Tolerance Patient tolerated treatment well   Patient Left in chair;with call bell/phone within reach;with chair alarm set   Nurse Communication Mobility status        Time: 1749-4496 OT Time Calculation (min): 24 min  Charges: OT General Charges $OT Visit: 1 Visit OT Treatments $Self Care/Home Management : 8-22 mins  [place], OTR/L Acute Rehab Pager: [phone] Office: [place] 10/03/2017, 12:46 PM

## 2017-10-03 NOTE — Discharge Summary (Signed)
[facility]  Physician Discharge Summary   Patient ID: Eric [name] MRN: [id number] DOB/AGE: [birth year] 84 y.o.  Admit date: 09/29/2017 Discharge date: 10/03/2017  PCP: [name], MD  DISCHARGE DIAGNOSES:  Symptomatic bradycardia  RECOMMENDATIONS FOR OUTPATIENT FOLLOW UP: 1. Cardiology to arrange outpatient follow-up and event monitor 2. Dilantin has been changed over to [place] 3. Aricept has been held for now as it can cause bradycardia.  See below under seizure disorder/dementia 4. Metoprolol and clonidine has been discontinued 5. Patient started on pindolol 6. Patient's sister will reschedule appointment with nephrology.   DISCHARGE CONDITION: fair  Diet recommendation: As before  Filed Weights   10/01/17 0510 10/02/17 0451 10/03/17 0537  Weight: 69.5 kg (153 lb 4.8 oz) 70.3 kg (155 lb) 68.9 kg (152 lb)    INITIAL HISTORY: 84 year old African-American male with past medical history of chronic kidney disease stage IV, diastolic CHF, essential hypertension, chronic anemia, diabetes mellitus type 2 who was admitted last month for possible seizures and second-degree AV block.  Metoprolol was discontinued.  He presented overnight to the hospital as a result of the syncopal episode.  No mention of seizure type activity.  Patient was hospitalized for further management.  His telemetry again showed second-degree AV block with transient bradycardic episodes.  Patient was seen by cardiology.  They felt it was 2-1 block.  Patient was taken off of his clonidine.  Previously he had been taken off of his beta-blocker.   Consultations:  Dr. Einar Gip with cardiology   HOSPITAL COURSE:   Syncope/symptomatic bradycardia Syncope could have been due to significant bradycardia.  No obvious seizure activity was noted.  Patient's Dilantin has been changed over to [place] per discussions with neurology.  Dilantin can cause significant bradycardia.  Patient continued to have slow  heart rate.  There was AV conduction block.  Concern for second-degree.  Seen by cardiology who feels that this is 2-1 block, Wenckebach. During his previous hospitalization he was taken off of beta-blocker.  Clonidine has been discontinued as well.  Subsequently patient became tachycardic.  On 6/15 patient was placed on pindolol.  Heart rate seems to have improved over the last 48 hours. Mild elevation in troponin most likely due to his chronic kidney disease.  Acute on chronic kidney disease stage IV Baseline creatinine appears to be around 2.2-2.3.  Noted to be 2.93 at admission with a high BUN as well.  Holding patient's lisinopril.  Patient was given fluid bolus in the emergency department.  Renal function did improve.    Creatinine is 2.62 today.  He has been making urine but has had few episodes of incontinence.  He supposedly had an appointment to see a nephrologist on Thursday or Friday but he was in the hospital.  Discussed with the sister.  Told her to make another appointment for him to see his nephrologist.  No need for inpatient consultation.  His Lasix was changed over to torsemide by his cardiologist about 2 weeks ago.  Continue torsemide at discharge.   Acute UTI with Proteus UA noted to be abnormal.  Patient did mention symptoms suggestive of UTI.  Urine was sent for culture. He is allergic to penicillin but has tolerated cephalosporins including ceftriaxone previously. Patient placed on ceftriaxone.  Noted to have E. coli UTI back in April.  Current cultures growing Proteus.  Sensitivities reviewed.    He was switched over to Keflex.  Will be discharged on the same.  Essential hypertension Blood pressure is reasonably well controlled.  Continue amlodipine and hydralazine.  Clonidine was discontinued.  Pindolol added.    Chronic diastolic CHF Last EF measured was 55 to 60% with grade 1 diastolic dysfunction.  He was somewhat volume depleted at admission.  His diuretics have been  held.  He was given fluids in the emergency department.   Ok to resume torsemide.  Diabetes mellitus type 2, controlled with renal complications Continue home medication regimen  History of seizure disorder/history of dementia Dilantin changed over to [place] per neurology recommendation.    Aricept has been held as well due to epic and bradycardia.  If heart rate remains stable over the next several weeks could consider resuming this medication or try an alternative agent.  Will defer this to the PCP in consultation with patient's cardiologist.  Anemia likely secondary to chronic kidney disease Hemoglobin is stable and at baseline.  Overall stable.  Discussed with the sister today.  Skilled nursing facility was recommended by PT.  However sister wants to take him home. Considering patient's dementia and the fact that he has around-the-clock caregivers he will likely do better at home.   Okay for discharge today.    PERTINENT LABS:  The results of significant diagnostics from this hospitalization (including imaging, microbiology, ancillary and laboratory) are listed below for reference.    Microbiology: Recent Results (from the past 240 hour(s))  MRSA PCR Screening     Status: Abnormal   Collection Time: 09/30/17  3:13 AM  Result Value Ref Range Status   MRSA by PCR POSITIVE (A) NEGATIVE Final    Comment:        The GeneXpert MRSA Assay (FDA approved for NASAL specimens only), is one component of a comprehensive MRSA colonization surveillance program. It is not intended to diagnose MRSA infection nor to guide or monitor treatment for MRSA infections. RESULT CALLED TO, READ BACK BY AND VERIFIED WITH: Arlyn Dunning RN, AT [phone] 09/30/17 BY D. VANHOOK   Culture, Urine     Status: Abnormal   Collection Time: 09/30/17 12:12 PM  Result Value Ref Range Status   Specimen Description URINE, RANDOM  Final   Special Requests   Final    NONE Performed at [facility], [place]  [address], [place], [place] [address]    Culture >=100,000 COLONIES/mL PROTEUS MIRABILIS (A)  Final   Report Status 10/02/2017 FINAL  Final   Organism ID, Bacteria PROTEUS MIRABILIS (A)  Final      Susceptibility   Proteus mirabilis - MIC*    AMPICILLIN <=2 SENSITIVE Sensitive     CEFAZOLIN <=4 SENSITIVE Sensitive     CEFTRIAXONE <=1 SENSITIVE Sensitive     CIPROFLOXACIN <=0.25 SENSITIVE Sensitive     GENTAMICIN <=1 SENSITIVE Sensitive     IMIPENEM 8 INTERMEDIATE Intermediate     NITROFURANTOIN 128 RESISTANT Resistant     TRIMETH/SULFA <=20 SENSITIVE Sensitive     AMPICILLIN/SULBACTAM <=2 SENSITIVE Sensitive     PIP/TAZO <=4 SENSITIVE Sensitive     * >=100,000 COLONIES/mL PROTEUS MIRABILIS     Labs: Basic Metabolic Panel: Recent Labs  Lab 09/29/17 2233 09/30/17 0130 10/01/17 0807 10/02/17 0703 10/03/17 0443  NA 136 133* 138 134* 136  K 4.4 4.4 4.3 4.1 4.2  CL 103 103 110 104 107  CO2 21* 21* 20* 19* 21*  GLUCOSE 174* 144* 112* 114* 160*  BUN 66* 67* 57* 63* 63*  CREATININE 2.93* 2.85* 2.47* 2.78* 2.62*  CALCIUM 9.9 9.5 9.0 8.9 9.2  MG  --  1.8  --   --   --    Liver Function Tests: Recent Labs  Lab 09/29/17 2233  AST 20  ALT 18  ALKPHOS 133*  BILITOT 0.3  PROT 7.4  ALBUMIN 3.1*   CBC: Recent Labs  Lab 09/29/17 2233 09/30/17 0130  WBC 5.5 4.2  NEUTROABS 3.9  --   HGB 10.7* 10.7*  HCT 32.8* 32.7*  MCV 101.2* 99.7  PLT 270 232   Cardiac Enzymes: Recent Labs  Lab 09/30/17 0130 09/30/17 0658 09/30/17 1232  TROPONINI 0.06* 0.06* 0.06*   BNP: BNP (last 3 results) Recent Labs    06/29/17 1329 08/28/17 0554  BNP 256.0* 788.1*    CBG: Recent Labs  Lab 10/02/17 1641 10/02/17 1643 10/02/17 2120 10/03/17 0738 10/03/17 1134  GLUCAP 565* 182* 214* 127* 123*     IMAGING STUDIES Dg Chest Portable 1 View  Result Date: 09/29/2017 CLINICAL DATA:  Acute onset nausea and vomiting, syncopal episode. EXAM: PORTABLE CHEST 1 VIEW COMPARISON:  Chest  radiograph Aug 26, 2017 FINDINGS: The cardiac silhouette is mildly enlarged, improved from prior imaging. Calcified aortic knob. Mildly elevated RIGHT hemidiaphragm. Strandy densities LEFT base. No focal consolidation. Biapical thickening. No pneumothorax. Osteopenia. Symmetric calcification likely vascular. IMPRESSION: Mild cardiomegaly.  LEFT lung base atelectasis/scarring. Aortic Atherosclerosis (ICD10-I70.0). Electronically Signed   By: Elon Alas M.D.   On: 09/29/2017 23:02    DISCHARGE EXAMINATION: Vitals:   10/02/17 0512 10/02/17 1201 10/02/17 2119 10/03/17 0537  BP: (!) 120/52 127/61 127/73 (!) 153/55  Pulse: 100 79 88 82  Resp: 18 18 (!) 118   Temp: 98.8 F (37.1 C) 98.9 F (37.2 C) 98.7 F (37.1 C) 97.9 F (36.6 C)  TempSrc: Oral Oral Oral Oral  SpO2: 97% 98% 98% 100%  Weight:    68.9 kg (152 lb)  Height:       Telemetry reviewed.  Heart rate has been in the 80s and 90s the last 2 days.  General appearance: alert, cooperative, appears stated age, distracted and no distress Resp: clear to auscultation bilaterally Cardio: regular rate and rhythm, S1, S2 normal, no murmur, click, rub or gallop GI: soft, non-tender; bowel sounds normal; no masses,  no organomegaly Extremities: extremities normal, atraumatic, no cyanosis or edema  DISPOSITION: Home with home health  Discharge Instructions    Call MD for:  difficulty breathing, headache or visual disturbances   Complete by:  As directed    Call MD for:  extreme fatigue   Complete by:  As directed    Call MD for:  persistant dizziness or light-headedness   Complete by:  As directed    Call MD for:  persistant nausea and vomiting   Complete by:  As directed    Call MD for:  severe uncontrolled pain   Complete by:  As directed    Call MD for:  temperature >100.4   Complete by:  As directed    Diet Carb Modified   Complete by:  As directed    Discharge instructions   Complete by:  As directed    1. Dilantin has  been changed over to [place].   2. Aricept will be kept on hold as it can cause significant bradycardia.  This could be resumed in the future if okayed by cardiology.  Or alternatively another agent can be considered.  Defer this to PCP.   3. Patient's clonidine and metoprolol has been discontinued.   4. Pindolol has been initiated by cardiology.  They will  follow-up in their office and arrange event monitor. 5.  You will need to reschedule appointment with the nephrologist.  You were cared for by a hospitalist during your hospital stay. If you have any questions about your discharge medications or the care you received while you were in the hospital after you are discharged, you can call the unit and asked to speak with the hospitalist on call if the hospitalist that took care of you is not available. Once you are discharged, your primary care physician will handle any further medical issues. Please note that NO REFILLS for any discharge medications will be authorized once you are discharged, as it is imperative that you return to your primary care physician (or establish a relationship with a primary care physician if you do not have one) for your aftercare needs so that they can reassess your need for medications and monitor your lab values. If you do not have a primary care physician, you can call [phone] for a physician referral.   Increase activity slowly   Complete by:  As directed         Allergies as of 10/03/2017      Reactions   Penicillins    Hydroxyzine Rash   Percocet [oxycodone-acetaminophen] Rash      Medication List    STOP taking these medications   cloNIDine 0.2 MG tablet Commonly known as:  CATAPRES   donepezil 10 MG tablet Commonly known as:  ARICEPT   lisinopril 20 MG tablet Commonly known as:  PRINIVIL,ZESTRIL   metoprolol succinate 50 MG 24 hr tablet Commonly known as:  TOPROL-XL   phenytoin 100 MG ER capsule Commonly known as:  DILANTIN     TAKE these  medications   ACCU-CHEK AVIVA PLUS test strip Generic drug:  glucose blood   acetaminophen 500 MG tablet Commonly known as:  TYLENOL Take 500 mg by mouth 2 (two) times daily. Reported on 06/19/2015   amLODipine 10 MG tablet Commonly known as:  NORVASC Take 10 mg by mouth every evening.   B-D UF III MINI PEN NEEDLES 31G X 5 MM Misc Generic drug:  Insulin Pen Needle   calamine lotion Apply 1 application topically 2 (two) times daily as needed for itching (to shoulders/ back).   calcitRIOL 0.25 MCG capsule Commonly known as:  ROCALTROL Take 0.25 mcg by mouth daily.   cephALEXin 500 MG capsule Commonly known as:  KEFLEX Take 1 capsule (500 mg total) by mouth every 12 (twelve) hours for 5 days. For 5 more days   cholecalciferol 1000 units tablet Commonly known as:  VITAMIN D Take 2,000 Units by mouth daily.   diclofenac sodium 1 % Gel Commonly known as:  VOLTAREN Apply 2 g topically 4 (four) times daily.   fluticasone 50 MCG/ACT nasal spray Commonly known as:  FLONASE Place 1 spray into both nostrils 2 (two) times daily. What changed:    when to take this  reasons to take this   guaiFENesin 600 MG 12 hr tablet Commonly known as:  MUCINEX Take 2 tablets (1,200 mg total) by mouth 2 (two) times daily.   hydrALAZINE 25 MG tablet Commonly known as:  APRESOLINE Take 75 mg by mouth 3 (three) times daily.   insulin lispro 100 UNIT/ML injection Commonly known as:  HUMALOG Inject 0-5 Units into the skin 3 (three) times daily before meals. Sliding scale   LANTUS SOLOSTAR 100 UNIT/ML Solostar Pen Generic drug:  Insulin Glargine Inject 22 Units into the skin daily at  10 pm.   latanoprost 0.005 % ophthalmic solution Commonly known as:  XALATAN Place 1 drop into both eyes at bedtime.   levETIRAcetam 250 MG tablet Commonly known as:  KEPPRA Take 1 tablet (250 mg total) by mouth 2 (two) times daily.   loratadine 10 MG tablet Commonly known as:  CLARITIN Take 10 mg by  mouth daily with lunch.   OVER THE COUNTER MEDICATION Apply 1 application topically 4 (four) times daily as needed (clotrimazole= to the buttocks for rash as needed).   pantoprazole 40 MG tablet Commonly known as:  PROTONIX Take 40 mg by mouth daily.   pindolol 5 MG tablet Commonly known as:  VISKEN Take 1 tablet (5 mg total) by mouth 2 (two) times daily.   polyethylene glycol powder powder Commonly known as:  GLYCOLAX/MIRALAX Take 17 g by mouth daily as needed for mild constipation.   PROBIOTIC-10 PO Take 1 capsule by mouth daily.   senna 8.6 MG Tabs tablet Commonly known as:  SENOKOT Take 1 tablet by mouth daily.   SYSTANE ULTRA 0.4-0.3 % Soln Generic drug:  Polyethyl Glycol-Propyl Glycol Place 1 drop into both eyes 3 (three) times daily as needed (dry eyes).   torsemide 10 MG tablet Commonly known as:  DEMADEX Take 10 mg by mouth daily.        Follow-up Information    [facility], [facility] Follow up.   Specialty:  Home Health Services Why:  home health services arranged Contact information: [place] [phone] [phone]        [name], MD. Schedule an appointment as soon as possible for a visit in 1 week(s).   Specialty:  Internal Medicine Contact information: [place] [address] [phone]        Adrian Prows, MD.   Specialty:  Cardiology Why:  His office will call to schedule appointment. Contact information: [address] [place] [address] [phone]           TOTAL DISCHARGE TIME: 35 mins  [facility] Pager [phone]  10/03/2017, 11:45 AM

## 2017-10-03 NOTE — Progress Notes (Signed)
Occupational Therapy - Durable Medical Equipment     Durable Medical Equipment  (From admission, onward)        Start     Ordered   10/03/17 1207  For home use only DME lightweight manual wheelchair with seat cushion  Once    Comments:  Patient suffers from history of stroke, back pain, leg edema which impairs their ability to perform daily activities like walking, bathing, standing in the home.  A rolling walker will not resolve  issue with performing activities of daily living. A wheelchair will allow patient to safely perform daily activities. Patient is not able to propel themselves in the home using a standard weight wheelchair due to weakness and pain. Patient can self propel in the lightweight wheelchair.  Accessories: elevating leg rests (ELRs), wheel locks, extensions and anti-tippers.   10/03/17 [place], OTR/L Acute Rehab Pager: [phone] Office: [phone]

## 2017-10-03 NOTE — Clinical Social Work Note (Signed)
Clinical Social Work Assessment  Patient Details  Name: Eric [name] MRN: [id number] Date of Birth: 1923-08-21  Date of referral:  10/03/17               Reason for consult:  Facility Placement, Discharge Planning                Permission sought to share information with:  Family Supports Permission granted to share information::  Yes, Verbal Permission Granted  Name::     [name], [place]::     Relationship::  [name]: [phone], Ms. Hill: [phone]  Contact Information:  [name]: Son/HCPOA, [name]: Sister  Housing/Transportation Living arrangements for the past 2 months:  Single Family Home Source of Information:  Medical Team, Adult Children, Siblings Patient Interpreter Needed:  None Criminal Activity/Legal Involvement Pertinent to Current Situation/Hospitalization:  No - Comment as needed Significant Relationships:  Adult Children, Siblings Lives with:  Self Do you feel safe going back to the place where you live?  Yes Need for family participation in patient care:  Yes (Comment)  Care giving concerns:  PT recommending SNF once medically stable for discharge.   Social Worker assessment / plan:  Patient not fully oriented. CSW called patient's son/HCPOA, [name], introduced role, and explained that PT recommendations would be discussed. Patient's son lives in [place] and stated patient's sister is his primary caretaker and although he is HCPOA, he would like to defer SNF vs. HHPT decision to her. CSW called and spoke with [name]. She confirmed plan to return home rather than SNF at discharge. He was still getting HHPT from previous admission through [facility] and would like to continue that. She also asked about a lightweight wheelchair. RNCM notified. No further concerns. CSW signing off as social work intervention is no longer needed.  Employment status:  Retired Nurse, adult PT Recommendations:  [place] / Referral to community resources:  [place]  Patient/Family's Response to care:  Patient not fully oriented. Patient's HCPOA deferred decision making to patient's sister. Patient's sister prefers HHPT rather than SNF. Patient's family supportive and involved in patient's care. Patient's son and sister appreciated social work intervention.  Patient/Family's Understanding of and Emotional Response to Diagnosis, Current Treatment, and Prognosis:  Patient not fully oriented. Patient's sister has a good understanding of the reason for admission and his need for continued therapy after discharge. Patient's sister appears happy with hospital care.  Emotional Assessment Appearance:  Appears stated age Attitude/Demeanor/Rapport:  Unable to Assess Affect (typically observed):  Unable to Assess Orientation:  Oriented to Self, Oriented to Place Alcohol / Substance use:  Never Used Psych involvement (Current and /or in the community):  No (Comment)  Discharge Needs  Concerns to be addressed:  Care Coordination Readmission within the last 30 days:  No Current discharge risk:  Cognitively Impaired, Dependent with Mobility Barriers to Discharge:  No Barriers Identified   Candie Chroman, LCSW 10/03/2017, 11:32 AM

## 2017-10-03 NOTE — Progress Notes (Addendum)
Discharge Planning:Please see previous NCM notes. Notified [facility] of plan for dc to home with resumption of care. NCM spoke to sister Kirstie Mirza . States his wheelchair is not working properly. Requesting light manual wheelchair for home. Contacted [facility] for DME. States delivery to room prior to dc, had no ETA. Will have it delivered to home. Confirmed address with sister, Vicente Males. Provided sister with contact number for [facility]. Jonnie Finner RN CCM Case Mgmt phone [phone]

## 2017-10-03 NOTE — Progress Notes (Signed)
Subjective:  No recurrence of syncopal episodes. No more AV conduction blocks. Tolerating pindolol well.  Objective:  Vital Signs in the last 24 hours: Temp:  [97.9 F (36.6 C)-98.9 F (37.2 C)] 97.9 F (36.6 C) (06/17 0537) Pulse Rate:  [79-88] 82 (06/17 0537) Resp:  [18-118] 118 (06/16 2119) BP: (127-153)/(55-73) 153/55 (06/17 0537) SpO2:  [98 %-100 %] 100 % (06/17 0537) Weight:  [68.9 kg (152 lb)] 68.9 kg (152 lb) (06/17 0537)  Intake/Output from previous day: 06/16 0701 - 06/17 0700 In: 840 [P.O.:840] Out: 750 [Urine:750] Intake/Output from this shift: Total I/O In: 240 [P.O.:240] Out: -   Physical Exam: General appearance: alert, cooperative, appears stated age and no distress Eyes: negative findings: lids and lashes normal Neck: no adenopathy, no carotid bruit, no JVD, supple, symmetrical, trachea midline and thyroid not enlarged, symmetric, no tenderness/mass/nodules Neck: JVP - normal, carotids 2+= without bruits Resp: Right base faint crackles Chest wall: no tenderness Cardio: regular rate and rhythm, S1, S2 normal, no murmur, click, rub or gallop and Tachycardia present GI: soft, non-tender; bowel sounds normal; no masses,  no organomegaly Extremities: extremities normal, atraumatic, no cyanosis or edema     Lab Results: No results for input(s): WBC, HGB, PLT in the last 72 hours. Recent Labs    10/02/17 0703 10/03/17 0443  NA 134* 136  K 4.1 4.2  CL 104 107  CO2 19* 21*  GLUCOSE 114* 160*  BUN 63* 63*  CREATININE 2.78* 2.62*   Recent Labs    09/30/17 1232  TROPONINI 0.06*   CARDIAC STUDIES:  EKG/Telemetry 09/30/2017: Underlying sinus rhythm with 2: 1 AV conduction, ventricular rate 44 bpm, otherwise normal QRS, normal QT interval.  Telemetry reveals episodes of Wenckebach, episodes mostly 2: 1 AV conduction with underlying sinus rhythm with marked bradycardia.  Carotid sinus massage:Induction of bradycardia to a rate of 39 to 40 bpm  with resolution of 2: 1 conduction.  Echocardiogram 0/73/7106:YIRSWN LV systolic function, mild LVH, EF 55 to 46%, mild diastolic dysfunction, moderately calcified mitral valve, moderate pulmonary hypertension with PA pressure 53 mmHg   Assessment & Recommendations:  84 year old African-American male  Syncopal episode: Likely multifactorial.  No other AV conduction issues seen other than intermittent 2 to one AV block, and second-degree type I AV block.  Resolved after stopping clonidine. No indication for pacemaker at this time. Tolerating pindolol well.  We'll set up outpatient event monitor and follow-up.  Mild troponin elevation: Not related to myocardial injury.  Rest of the chronic medical problems managed by hospitalist service including hypertension, type II diabetes mellitus, CKD stage 4.  Okay to discharge patient home today.    LOS: 0 days    Manish J Patwardhan 10/03/2017, 10:12 AM  [place], MD [facility] Pager: [phone] Office: [phone] If no answer Cell [phone]

## 2017-10-04 ENCOUNTER — Emergency Department (HOSPITAL_COMMUNITY): Payer: Medicare Other

## 2017-10-04 ENCOUNTER — Encounter (HOSPITAL_COMMUNITY): Payer: Self-pay | Admitting: Emergency Medicine

## 2017-10-04 ENCOUNTER — Telehealth: Payer: Self-pay | Admitting: Neurology

## 2017-10-04 ENCOUNTER — Emergency Department (HOSPITAL_COMMUNITY)
Admission: EM | Admit: 2017-10-04 | Discharge: 2017-10-04 | Disposition: A | Discharge status: Home / Self Care | Payer: Medicare Other | Attending: Physician Assistant | Admitting: Physician Assistant

## 2017-10-04 DIAGNOSIS — E109 Type 1 diabetes mellitus without complications: Secondary | ICD-10-CM | POA: Diagnosis not present

## 2017-10-04 DIAGNOSIS — Z79899 Other long term (current) drug therapy: Secondary | ICD-10-CM | POA: Insufficient documentation

## 2017-10-04 DIAGNOSIS — Z8673 Personal history of transient ischemic attack (TIA), and cerebral infarction without residual deficits: Secondary | ICD-10-CM | POA: Diagnosis not present

## 2017-10-04 DIAGNOSIS — R531 Weakness: Secondary | ICD-10-CM

## 2017-10-04 DIAGNOSIS — R7989 Other specified abnormal findings of blood chemistry: Secondary | ICD-10-CM | POA: Insufficient documentation

## 2017-10-04 DIAGNOSIS — R778 Other specified abnormalities of plasma proteins: Secondary | ICD-10-CM

## 2017-10-04 DIAGNOSIS — I10 Essential (primary) hypertension: Secondary | ICD-10-CM | POA: Diagnosis not present

## 2017-10-04 DIAGNOSIS — Z794 Long term (current) use of insulin: Secondary | ICD-10-CM | POA: Diagnosis not present

## 2017-10-04 DIAGNOSIS — M6281 Muscle weakness (generalized): Secondary | ICD-10-CM | POA: Diagnosis not present

## 2017-10-04 LAB — COMPREHENSIVE METABOLIC PANEL
ALBUMIN: 2.7 g/dL — AB (ref 3.5–5.0)
ALK PHOS: 112 U/L (ref 38–126)
ALT: 29 U/L (ref 17–63)
AST: 31 U/L (ref 15–41)
Anion gap: 10 (ref 5–15)
BUN: 73 mg/dL — ABNORMAL HIGH (ref 6–20)
CALCIUM: 9.5 mg/dL (ref 8.9–10.3)
CO2: 20 mmol/L — AB (ref 22–32)
CREATININE: 2.95 mg/dL — AB (ref 0.61–1.24)
Chloride: 107 mmol/L (ref 101–111)
GFR calc Af Amer: 20 mL/min — ABNORMAL LOW (ref >60)
GFR calc non Af Amer: 17 mL/min — ABNORMAL LOW (ref >60)
GLUCOSE: 115 mg/dL — AB (ref 65–99)
Potassium: 4 mmol/L (ref 3.5–5.1)
SODIUM: 137 mmol/L (ref 135–145)
Total Bilirubin: 0.2 mg/dL — ABNORMAL LOW (ref 0.3–1.2)
Total Protein: 6.6 g/dL (ref 6.5–8.1)

## 2017-10-04 LAB — I-STAT TROPONIN, ED
TROPONIN I, POC: 0.1 ng/mL — AB (ref 0.00–0.08)
Troponin i, poc: 0.11 ng/mL (ref 0.00–0.08)

## 2017-10-04 LAB — I-STAT CHEM 8, ED
BUN: 71 mg/dL — ABNORMAL HIGH (ref 6–20)
CALCIUM ION: 1.33 mmol/L (ref 1.15–1.40)
CHLORIDE: 106 mmol/L (ref 101–111)
Creatinine, Ser: 2.9 mg/dL — ABNORMAL HIGH (ref 0.61–1.24)
GLUCOSE: 114 mg/dL — AB (ref 65–99)
HCT: 25 % — ABNORMAL LOW (ref 39.0–52.0)
HEMOGLOBIN: 8.5 g/dL — AB (ref 13.0–17.0)
Potassium: 4.1 mmol/L (ref 3.5–5.1)
SODIUM: 138 mmol/L (ref 135–145)
TCO2: 22 mmol/L (ref 22–32)

## 2017-10-04 LAB — APTT: APTT: 27 s (ref 24–36)

## 2017-10-04 LAB — CBG MONITORING, ED: GLUCOSE-CAPILLARY: 135 mg/dL — AB (ref 65–99)

## 2017-10-04 LAB — DIFFERENTIAL
BASOS ABS: 0 10*3/uL (ref 0.0–0.1)
Basophils Relative: 0 %
Eosinophils Absolute: 0.3 10*3/uL (ref 0.0–0.7)
Eosinophils Relative: 6 %
Lymphocytes Relative: 20 %
Lymphs Abs: 1 10*3/uL (ref 0.7–4.0)
MONOS PCT: 2 %
Monocytes Absolute: 0.1 10*3/uL (ref 0.1–1.0)
NEUTROS ABS: 3.7 10*3/uL (ref 1.7–7.7)
Neutrophils Relative %: 72 %

## 2017-10-04 LAB — CBC
HCT: 29.9 % — ABNORMAL LOW (ref 39.0–52.0)
HEMOGLOBIN: 9.4 g/dL — AB (ref 13.0–17.0)
MCH: 32.8 pg (ref 26.0–34.0)
MCHC: 31.4 g/dL (ref 30.0–36.0)
MCV: 104.2 fL — AB (ref 78.0–100.0)
Platelets: 194 10*3/uL (ref 150–400)
RBC: 2.87 MIL/uL — ABNORMAL LOW (ref 4.22–5.81)
RDW: 12.7 % (ref 11.5–15.5)
WBC: 5.1 10*3/uL (ref 4.0–10.5)

## 2017-10-04 LAB — PROTIME-INR
INR: 1.09
Prothrombin Time: 14.1 seconds (ref 11.4–15.2)

## 2017-10-04 NOTE — ED Triage Notes (Signed)
Pt's wife states pt was just discharged yesterday from hospital for dizziness/passing out. Yesterday, pt's wife states that he had left arm/left leg weakness, "mumbling/garbled" speech all day. Pt's physical therapist called pt's primary care MD and was told to come here to evaluate pt. Denies headache.

## 2017-10-04 NOTE — Discharge Instructions (Addendum)
You are seen today for left leg weakness.  We have done scans of your head and there is no signs of stroke.  You are safe to go home now.  We do suggest that you follow-up with your primary care doctor and continue physical therapy.

## 2017-10-04 NOTE — ED Notes (Signed)
Pt transported to MRI 

## 2017-10-04 NOTE — Telephone Encounter (Signed)
Eric [name] [phone] advised the pt was in the hospital from 6/14 - 6/17 under observation. The patient is at home now, he feels the patient has had a stroke and would like an appt for evaluation by Dr Leonie Man. He advises the patient is unable to lift the left leg now. He said the patient was doing well prior to going to ED. He has LVM for PCP as directed in the AVS from the hospital but he feels the pt should see neurologist. Please call the patients sister at [phone] to schedule appointment.

## 2017-10-04 NOTE — ED Provider Notes (Signed)
Patient placed in Quick Look pathway, seen and evaluated   Chief Complaint: L sided weakness  HPI:   Patient presents to ED for L sided weakness. Patient was discharged from hospital admission yesterday for syncope/symptomatic bradycardia workup. Holter monitor was placed. Wife states that he has had this left sided weakness for the past 1-2 days. He has been working with PT to strengthen the side. The PT evaluating him today states that he should come to ED for further workup of left sided weakness. Wife states that symptoms were present before and after his discharge from hospital yesterday. Hx of stroke with no residual effects. Denies any falls or injuries. No current blood thinner use. Denies headache, vision changes or vision loss.  ROS: weakness  Physical Exam:   Gen: No distress  Neuro: Awake and Alert  Skin: Warm    Focused Exam: PERRL. Slightly decreased grip strength of L side. No facial asymmetry noted. Slow to complete finger to nose coordination on L side.   Initiation of care has begun. The patient has been counseled on the process, plan, and necessity for staying for the completion/evaluation, and the remainder of the medical screening examination    Delia Heady, PA-C 10/04/17 1550    [place], Fredia Sorrow, MD 10/05/17 0002

## 2017-10-04 NOTE — ED Notes (Signed)
Pt back in room from MRI.

## 2017-10-04 NOTE — Telephone Encounter (Signed)
Phone rep spoke with RN Katrina, she advised ok to schedule with Jessica,NP. Pt's sister was called and appointment scheduled for 6/20 @ 9:45.  FYI

## 2017-10-04 NOTE — ED Notes (Signed)
Attempting to find pt and unable to do so.

## 2017-10-04 NOTE — ED Provider Notes (Signed)
[facility]Y DEPART[facility] Provider Note   CSN: [id number] Arrival date & time: 10/04/17  1456   History   Chief Complaint Chief Complaint  Patient presents with  . Stroke Symptoms    HPI Eric [name] is a 84 y.o. male here for left sided  weakness.   Patient is here with his brother and his brother's wife.  Patient has no concerns and does not endorse any issues.  His brother's wife notes that he was working with physical therapy today and there was some concerns because he was dragging his left leg.  He also endorsed some tingling and numbness in his left upper extremity.  There was also possible mumbling of speech.  Patient has been sleepy since being discharged from the hospital yesterday.  He has been eating and drinking normally, no issues swallowing.   Of note patient was discharged from the hospital yesterday, he was hospitalized for syncope and symptomatic bradycardia.  HPI  Past Medical History:  Diagnosis Date  . Adult failure to thrive   . Altered mental status   . ANEMIA-NOS 02/17/2007  . Anxiety   . BACK PAIN, LUMBAR   . BENIGN PROSTATIC HYPERTROPHY   . Bilateral leg edema   . CAD (coronary artery disease)    a. nonobstructive CAD by cath in 2013  . CAP (community acquired pneumonia)   . CKD (chronic kidney disease)   . DEPRESSION 12/25/2008  . Diabetes mellitus   . DIABETES MELLITUS, TYPE I, CONTROLLED, WITH RETINOPATHY   . Encephalopathy, metabolic   . Gastroparesis   . GERD 02/17/2007  . Hyperlipidemia   . HYPERTENSION 02/17/2007  . Hyponatremia   . LIVER DISORDER   . OSTEOARTHRITIS 03/04/2010  . Pneumonia   . PSA, INCREASED   . Stroke ([place])   . UTI (urinary tract infection) 07/2017  . VITAMIN B12 DEFICIENCY     Patient Active Problem List   Diagnosis Date Noted  . Syncope 09/30/2017  . Pressure injury of skin 08/28/2017  . AV block, [place] 2   . Acute diastolic CHF (congestive heart failure) ([place]) 08/26/2017  .  Staring episodes 08/26/2017  . Diastolic CHF ([place]) 22/29/7989  . Elevated troponin 08/26/2017  . HCAP (healthcare-associated pneumonia)   . Severe sepsis ([place])   . Sepsis due to pneumonia ([place]) 10/30/2016  . History of DVT (deep vein thrombosis) 10/30/2016  . UTI (urinary tract infection) 10/03/2016  . Memory loss 06/22/2016  . AKI (acute kidney injury) ([place]) 11/12/2014  . TIA (transient ischemic attack) 11/09/2014  . Seizure ([place]) 02/08/2012  . Lesion of lung 02/08/2012  . DM2 (diabetes mellitus, type 2) ([place]) 02/08/2012  . Hypertensive encephalopathy 01/06/2012  . Hyperglycemia 01/06/2012  . MGUS (monoclonal gammopathy of unknown significance) 10/28/2011  . Bilateral leg edema 10/28/2011  . Fall at home 10/28/2011  . Failure to thrive in adult 10/28/2011  . Type 2 diabetes mellitus with hypoglycemia ([place]) 10/27/2011  . Dysphagia 10/27/2011  . Encephalopathy 10/27/2011  . Hypernatremia 10/24/2011  . Altered mental status 10/20/2011  . Generalized weakness 10/18/2011  . Acute metabolic encephalopathy   . Dependent edema 10/02/2011  . Hyponatremia 10/02/2011  . Pneumonia 04/24/2011  . CKD (chronic kidney disease) stage 3, GFR 30-59 ml/min ([place]) 04/19/2011  . Bronchitis, acute 04/16/2011  . Edema 03/30/2011  . OSTEOARTHRITIS 03/04/2010  . LIVER DISORDER 12/09/2009  . BACK PAIN, LUMBAR 09/09/2009  . ABDOMINAL PAIN, UNSPECIFIED SITE 01/31/2009  . SYNCOPE 01/08/2009  . ELBOW PAIN, LEFT 12/30/2008  .  ABNORMAL ELECTROCARDIOGRAM 12/30/2008  . DEPRESSION 12/25/2008  . WEIGHT LOSS 12/25/2008  . NOSEBLEED 12/25/2008  . VITAMIN B12 DEFICIENCY 10/24/2008  . DM (diabetes mellitus), type 1, uncontrolled w/ophthalmic complication ([place]) 78/58/8502  . PANCYTOPENIA 10/08/2008  . PSA, INCREASED 10/08/2008  . DYSURIA 10/03/2008  . LEG PAIN, BILATERAL 12/26/2007  . KNEE PAIN, LEFT 08/18/2007  . COUGH 04/18/2007  . Hyperlipidemia 02/17/2007  . ANEMIA-NOS 02/17/2007  . Essential  hypertension 02/17/2007  . ALLERGIC RHINITIS 02/17/2007  . GERD 02/17/2007  . GASTROPARESIS 02/17/2007  . Benign prostatic hyperplasia 02/17/2007  . HYPERURICEMIA 02/17/2007    Past Surgical History:  Procedure Laterality Date  . LEFT HEART CATHETERIZATION WITH CORONARY ANGIOGRAM N/A 04/23/2011   Procedure: LEFT HEART CATHETERIZATION WITH CORONARY ANGIOGRAM;  Surgeon: Peter M Martinique, MD;  Location[facility] CATH LAB;  Service: Cardiovascular;  Laterality: N/A;  . TRANSURETHRAL RESECTION OF PROSTATE          Home Medications    Prior to Admission medications   Medication Sig Start Date End Date Taking? Authorizing Provider  acetaminophen (TYLENOL) 500 MG tablet Take 500 mg by mouth 2 (two) times daily. Reported on 06/19/2015   Yes [provider]  amLODipine (NORVASC) 10 MG tablet Take 10 mg by mouth every evening.  05/26/16  Yes [provider]  calamine lotion Apply 1 application topically 2 (two) times daily as needed for itching (to shoulders/ back).   Yes [provider]  calcitRIOL (ROCALTROL) 0.25 MCG capsule Take 0.25 mcg by mouth daily.  12/26/11  Yes [provider]  cephALEXin (KEFLEX) 500 MG capsule Take 1 capsule (500 mg total) by mouth every 12 (twelve) hours for 5 days. For 5 more days 10/03/17 10/08/17 Yes Bonnielee Haff, MD  cholecalciferol (VITAMIN D) 1000 UNITS tablet Take 2,000 Units by mouth daily.    Yes [provider]  fluticasone (FLONASE) 50 MCG/ACT nasal spray Place 1 spray into both nostrils 2 (two) times daily. Patient taking differently: Place 1 spray into both nostrils 2 (two) times daily as needed for allergies.  10/04/16  Yes Patrecia Pour, Christean Grief, MD  hydrALAZINE (APRESOLINE) 25 MG tablet Take 75 mg by mouth 3 (three) times daily.  10/22/14  Yes [provider]  Insulin Glargine (LANTUS SOLOSTAR) 100 UNIT/ML Solostar Pen Inject 22 Units into the skin daily after breakfast.    Yes [provider]  insulin  lispro (HUMALOG) 100 UNIT/ML injection Inject 0-5 Units into the skin 3 (three) times daily as needed for high blood sugar (only if BGL is 150 or greater). Sliding scale    Yes [provider]  latanoprost (XALATAN) 0.005 % ophthalmic solution Place 1 drop into both eyes at bedtime. 06/11/17  Yes [provider]  levETIRAcetam (KEPPRA) 250 MG tablet Take 1 tablet (250 mg total) by mouth 2 (two) times daily. 10/03/17  Yes Bonnielee Haff, MD  loratadine (CLARITIN) 10 MG tablet Take 10 mg by mouth daily with lunch.   Yes [provider]  pantoprazole (PROTONIX) 40 MG tablet Take 40 mg by mouth daily.  05/04/15  Yes [provider]  pindolol (VISKEN) 5 MG tablet Take 1 tablet (5 mg total) by mouth 2 (two) times daily. 10/03/17  Yes Bonnielee Haff, MD  Polyethyl Glycol-Propyl Glycol (SYSTANE ULTRA) 0.4-0.3 % SOLN Place 1 drop into both eyes 3 (three) times daily as needed (dry eyes).   Yes [provider]  polyethylene glycol powder (GLYCOLAX/MIRALAX) powder Take 17 g by mouth daily as needed for  mild constipation.  05/18/14  Yes [provider]  Probiotic Product (PROBIOTIC-10 PO) Take 1 capsule by mouth daily.   Yes [provider]  senna (SENOKOT) 8.6 MG TABS Take 1 tablet by mouth daily.   Yes [provider]  torsemide (DEMADEX) 10 MG tablet Take 10 mg by mouth daily. 09/15/17  Yes [provider]  UNKNOWN TO PATIENT Apply 1 application topically See admin instructions. Unnamed ointment: Apply to buttocks 2 times a day to prevent skin breakdown   Yes [provider]  ACCU-CHEK AVIVA PLUS test strip  05/01/14   [provider]  B-D UF III MINI PEN NEEDLES 31G X 5 MM MISC  06/20/14   [provider]  diclofenac sodium (VOLTAREN) 1 % GEL Apply 2 g topically 4 (four) times daily. Patient not taking: Reported on 10/04/2017 03/08/17   Leandrew Koyanagi, MD  guaiFENesin (MUCINEX) 600 MG 12 hr tablet Take 2  tablets (1,200 mg total) by mouth 2 (two) times daily. Patient not taking: Reported on 10/04/2017 08/29/17   Shelly Coss, MD    Family History Family History  Problem Relation Age of Onset  . Cancer Father        had uncertain type of cancer  . Cancer Brother        Prostate Cancer  . Cancer Brother        Prostate Cancer  . Diabetes Other     Social History Social History   Tobacco Use  . Smoking status: Never Smoker  . Smokeless tobacco: Never Used  Substance Use Topics  . Alcohol use: No  . Drug use: No     Allergies   Penicillins; Hydroxyzine; and Percocet [oxycodone-acetaminophen]   Review of Systems Review of Systems: see HPI. Otherwise negative   Physical Exam Updated Vital Signs BP (!) 165/72   Pulse 80   Temp 98 F (36.7 C)   Resp 14   SpO2 100%   Physical Exam  Constitutional: He is oriented to person, place, and time. He appears well-developed and well-nourished.  HENT:  Head: Normocephalic.  Right Ear: External ear normal.  Left Ear: External ear normal.  Nose: Nose normal.  Mouth/Throat: Oropharynx is clear and moist.  Eyes: Pupils are equal, round, and reactive to light. Conjunctivae are normal.  Neck: Normal range of motion. Neck supple.  Cardiovascular: Normal rate, regular rhythm, normal heart sounds and intact distal pulses.  Pulmonary/Chest: Effort normal and breath sounds normal. No respiratory distress. He has no wheezes.  rhonchorous sounds in the right lower lung field  Abdominal: Soft. Bowel sounds are normal. He exhibits no distension and no mass. There is no tenderness.  Musculoskeletal: Normal range of motion. He exhibits no edema or tenderness.  Neurological: He is alert and oriented to person, place, and time. He displays tremor (hand tremor). No cranial nerve deficit or sensory deficit. He exhibits normal muscle tone. Coordination normal.  5/5 strength in bilateral upper and lower extremities. Some difficulty initiating hip  flexion on the right but normal strength     ED Treatments / Results  Labs (all labs ordered are listed, but only abnormal results are displayed) Labs Reviewed  CBC - Abnormal; Notable for the following components:      Result Value   RBC 2.87 (*)    Hemoglobin 9.4 (*)    HCT 29.9 (*)    MCV 104.2 (*)    All other components within normal limits  COMPREHENSIVE METABOLIC PANEL - Abnormal; Notable  for the following components:   CO2 20 (*)    Glucose, Bld 115 (*)    BUN 73 (*)    Creatinine, Ser 2.95 (*)    Albumin 2.7 (*)    Total Bilirubin 0.2 (*)    GFR calc non Af Amer 17 (*)    GFR calc Af Amer 20 (*)    All other components within normal limits  I-STAT TROPONIN, ED - Abnormal; Notable for the following components:   Troponin i, poc 0.11 (*)    All other components within normal limits  CBG MONITORING, ED - Abnormal; Notable for the following components:   Glucose-Capillary 135 (*)    All other components within normal limits  I-STAT CHEM 8, ED - Abnormal; Notable for the following components:   BUN 71 (*)    Creatinine, Ser 2.90 (*)    Glucose, Bld 114 (*)    Hemoglobin 8.5 (*)    HCT 25.0 (*)    All other components within normal limits  I-STAT TROPONIN, ED - Abnormal; Notable for the following components:   Troponin i, poc 0.10 (*)    All other components within normal limits  PROTIME-INR  APTT  DIFFERENTIAL    EKG EKG Interpretation  Date/Time:  Tuesday October 04 2017 15:17:57 EDT Ventricular Rate:  76 PR Interval:  252 QRS Duration: 84 QT Interval:  394 QTC Calculation: 443 R Axis:   9 Text Interpretation:  Sinus rhythm with 1st degree A-V block Nonspecific ST abnormality Abnormal ECG No significant change since last tracing Confirmed by Wandra Arthurs [phone]) on 10/04/2017 6:14:23 PM   Radiology Dg Chest 2 View  Result Date: 10/04/2017 CLINICAL DATA:  84 year old male with weakness, abnormal troponin. EXAM: CHEST - 2 VIEW COMPARISON:  Portable chest  09/29/2017 and earlier. FINDINGS: Electronic device anterior to the left chest wall. Lower lung volumes compared to 09/29/2017, but only mild basilar atelectasis. No pneumothorax, pulmonary edema, pleural effusion. Mediastinal contours are stable and within normal limits. Calcified aortic atherosclerosis. Visualized tracheal air column is within normal limits. Negative visible bowel gas pattern. No acute osseous abnormality identified. IMPRESSION: 1. Lower lung volumes with mild atelectasis. 2. No other acute cardiopulmonary abnormality. Electronically Signed   By: Genevie Ann M.D.   On: 10/04/2017 19:55   Ct Head Wo Contrast  Result Date: 10/04/2017 CLINICAL DATA:  84 year old male with dizziness and passing out yesterday. Left arm and leg weakness. Slurred speech. Initial encounter. EXAM: CT HEAD WITHOUT CONTRAST TECHNIQUE: Contiguous axial images were obtained from the base of the skull through the vertex without intravenous contrast. COMPARISON:  08/26/2017. FINDINGS: Brain: No intracranial hemorrhage or CT evidence of large acute infarct. Chronic microvascular changes. Moderate global atrophy. No intracranial mass lesion noted on this unenhanced exam. Vascular: Vascular calcifications Skull: No skull fracture. Sinuses/Orbits: No acute orbital abnormality. Visualized paranasal sinuses are clear. Other: Prominent dural and tentorial calcifications once again noted. Mastoid air cells and middle ear cavities are clear. IMPRESSION: No acute intracranial abnormality noted. Chronic microvascular changes. Global atrophy. Electronically Signed   By: Genia Del M.D.   On: 10/04/2017 17:09   Mr Brain Wo Contrast  Result Date: 10/04/2017 CLINICAL DATA:  Initial evaluation for acute left-sided weakness. EXAM: MRI HEAD WITHOUT CONTRAST TECHNIQUE: Multiplanar, multiecho pulse sequences of the brain and surrounding structures were obtained without intravenous contrast. COMPARISON:  Prior CT from earlier the same day as  well as previous MRI from 06/14/2017. FINDINGS: Brain: Mild generalized cerebral volume loss, stable.  Focal encephalomalacia at the right ventral medulla, unchanged. Few scatter remote left cerebellar infarcts again noted, stable. Gray-white matter differentiation otherwise maintained. No evidence for acute or subacute infarct. No acute or chronic intracranial hemorrhage. No mass lesion, midline shift or mass effect. Diffuse ventricular prominence related global parenchymal volume loss without hydrocephalus. No extra-axial fluid collection. Partially empty sella noted. Scattered dural calcifications noted along the falx and tentorium. Vascular: Major intracranial vascular flow voids maintained. Skull and upper cervical spine: Craniocervical junction within normal limits. Multilevel cervical spondylolysis noted within the upper cervical spine with suspected moderate spinal stenosis at C4-5, similar to previous. Bone marrow signal intensity within normal limits. No scalp soft tissue abnormality. Sinuses/Orbits: Globes and orbital soft tissues demonstrate no acute finding. Patient status post lens extraction on the left. Paranasal sinuses are clear. Trace opacity noted within the bilateral mastoid air cells, of doubtful significance. Inner ear structures within normal limits. Other: None. IMPRESSION: 1. No acute intracranial abnormality. 2. Few scattered small chronic left cerebellar infarcts, stable. 3. Degenerative spondylolysis at C4-5 with resultant moderate spinal stenosis. Electronically Signed   By: Jeannine Boga M.D.   On: 10/04/2017 19:43    Procedures Procedures (including critical care time)  Medications Ordered in ED Medications - No data to display   Initial Impression / Assessment and Plan / ED Course  I have reviewed the triage vital signs and the nursing notes.  Pertinent labs & imaging results that were available during my care of the patient were reviewed by me and considered in my  medical decision making (see chart for details).    84 year old male with numerous medical conditions here for possible LLE weakness when seen dragging his left foot during PT at home today. No focal neurological deficits on exam. CT head negative for CVA event.   Labs significant for Creatinine of 2.95, yesterday was 2.62. Albumin low at 2.7. Hgb 9.5 (b/l 9-10). I-STAT troponin 0.11 (was 0.06 4 days ago). EKG showing Sinus rhythm with 1st degree A-V block, No ST changes (unchanged from previous EKG).   Repeat troponin 0.10, downtrending.  No signs of ACS.  MRI negative for any acute CVA.  Chest x-ray unremarkable.  Patient is stable for discharge at this time.  Daughter-in-law wants to take him home so he can rest.  They will follow-up with PCP.    Final Clinical Impressions(s) / ED Diagnoses   Final diagnoses:  Generalized weakness  Elevated troponin    ED Discharge Orders    None       Carlyle Dolly, MD 10/04/17 2238    Drenda Freeze, MD 10/04/17 2329

## 2017-10-05 NOTE — Telephone Encounter (Signed)
Pts sister returned RNs call, schedule the pts appt 4 weeks out 7/22 FYI

## 2017-10-05 NOTE — Telephone Encounter (Signed)
Patient returned to ED yesterday after scheduling appointment for tomorrow on 10/06/17. MRI negative for acute abnormality. Please advise patient that he can reschedule appointment on 10/06/17 and follow up in 4 weeks. Continue all therapies and additional recommendations from hospital. Thank you.

## 2017-10-05 NOTE — Telephone Encounter (Addendum)
If patients sister call back please give her below message cancel appt for 10/06/2017, and r/s pt in 4 weeks.   Lft vm for sister Lelon Frohlich that pt was just seen in Ed on 10/04/2017. PT had a MRI at ED yesterday and it was negative for acute issues and  negative for stroke work up. Janett Billow NP would like for patient to r/s in 4 weeks and to continue this home physical therapy.

## 2017-10-06 ENCOUNTER — Ambulatory Visit: Payer: Medicare Other | Admitting: Adult Health

## 2017-10-17 ENCOUNTER — Emergency Department (HOSPITAL_COMMUNITY)
Admission: EM | Admit: 2017-10-17 | Discharge: 2017-10-17 | Disposition: A | Discharge status: Home / Self Care | Payer: Medicare Other | Attending: Emergency Medicine | Admitting: Emergency Medicine

## 2017-10-17 ENCOUNTER — Encounter (HOSPITAL_COMMUNITY): Payer: Self-pay | Admitting: *Deleted

## 2017-10-17 ENCOUNTER — Other Ambulatory Visit: Payer: Self-pay

## 2017-10-17 DIAGNOSIS — E1022 Type 1 diabetes mellitus with diabetic chronic kidney disease: Secondary | ICD-10-CM | POA: Diagnosis not present

## 2017-10-17 DIAGNOSIS — Z79899 Other long term (current) drug therapy: Secondary | ICD-10-CM | POA: Insufficient documentation

## 2017-10-17 DIAGNOSIS — I130 Hypertensive heart and chronic kidney disease with heart failure and stage 1 through stage 4 chronic kidney disease, or unspecified chronic kidney disease: Secondary | ICD-10-CM | POA: Insufficient documentation

## 2017-10-17 DIAGNOSIS — I5031 Acute diastolic (congestive) heart failure: Secondary | ICD-10-CM | POA: Diagnosis not present

## 2017-10-17 DIAGNOSIS — I2510 Atherosclerotic heart disease of native coronary artery without angina pectoris: Secondary | ICD-10-CM | POA: Diagnosis not present

## 2017-10-17 DIAGNOSIS — T6591XA Toxic effect of unspecified substance, accidental (unintentional), initial encounter: Secondary | ICD-10-CM | POA: Insufficient documentation

## 2017-10-17 DIAGNOSIS — N183 Chronic kidney disease, stage 3 (moderate): Secondary | ICD-10-CM | POA: Diagnosis not present

## 2017-10-17 DIAGNOSIS — Z794 Long term (current) use of insulin: Secondary | ICD-10-CM | POA: Insufficient documentation

## 2017-10-17 NOTE — Discharge Instructions (Addendum)
Return patient here for any new confusion, lethargy, or if he just does not look right

## 2017-10-17 NOTE — ED Notes (Signed)
Pt and family updated on plan of care  

## 2017-10-17 NOTE — ED Provider Notes (Signed)
[facility] DEPT Provider Note   CSN: [id number] Arrival date & time: 10/17/17  3295     History   Chief Complaint Chief Complaint  Patient presents with  . Ingestion    Accidental    HPI CASON LUFFMAN is a 84 y.o. male.  84 year old male with history of dementia presents after actually taking his entire medication regimen at once.  According to nursing notes, patient took an extra dose of his pindolol, Keppra, hydralazine, Tylenol.  Patient himself has no complaints of at this time.  He does have 24-hour home care according to nursing staff.  Was transported here for further evaluation.  No treatment was used by EMS prior to arrival     Past Medical History:  Diagnosis Date  . Adult failure to thrive   . Altered mental status   . ANEMIA-NOS 02/17/2007  . Anxiety   . BACK PAIN, LUMBAR   . BENIGN PROSTATIC HYPERTROPHY   . Bilateral leg edema   . CAD (coronary artery disease)    a. nonobstructive CAD by cath in 2013  . CAP (community acquired pneumonia)   . CKD (chronic kidney disease)   . DEPRESSION 12/25/2008  . Diabetes mellitus   . DIABETES MELLITUS, TYPE I, CONTROLLED, WITH RETINOPATHY   . Encephalopathy, metabolic   . Gastroparesis   . GERD 02/17/2007  . Hyperlipidemia   . HYPERTENSION 02/17/2007  . Hyponatremia   . LIVER DISORDER   . OSTEOARTHRITIS 03/04/2010  . Pneumonia   . PSA, INCREASED   . Stroke ([place])   . UTI (urinary tract infection) 07/2017  . VITAMIN B12 DEFICIENCY     Patient Active Problem List   Diagnosis Date Noted  . Syncope 09/30/2017  . Pressure injury of skin 08/28/2017  . AV block, [place] 2   . Acute diastolic CHF (congestive heart failure) ([place]) 08/26/2017  . Staring episodes 08/26/2017  . Diastolic CHF ([place]) 18/84/1660  . Elevated troponin 08/26/2017  . HCAP (healthcare-associated pneumonia)   . Severe sepsis ([place])   . Sepsis due to pneumonia ([employer]) 10/30/2016  . History of DVT (deep vein  thrombosis) 10/30/2016  . UTI (urinary tract infection) 10/03/2016  . Memory loss 06/22/2016  . AKI (acute kidney injury) ([place]) 11/12/2014  . TIA (transient ischemic attack) 11/09/2014  . Seizure ([place]) 02/08/2012  . Lesion of lung 02/08/2012  . DM2 (diabetes mellitus, type 2) ([place]) 02/08/2012  . Hypertensive encephalopathy 01/06/2012  . Hyperglycemia 01/06/2012  . MGUS (monoclonal gammopathy of unknown significance) 10/28/2011  . Bilateral leg edema 10/28/2011  . Fall at home 10/28/2011  . Failure to thrive in adult 10/28/2011  . Type 2 diabetes mellitus with hypoglycemia ([place]) 10/27/2011  . Dysphagia 10/27/2011  . Encephalopathy 10/27/2011  . Hypernatremia 10/24/2011  . Altered mental status 10/20/2011  . Generalized weakness 10/18/2011  . Acute metabolic encephalopathy   . Dependent edema 10/02/2011  . Hyponatremia 10/02/2011  . Pneumonia 04/24/2011  . CKD (chronic kidney disease) stage 3, GFR 30-59 ml/min ([place]) 04/19/2011  . Bronchitis, acute 04/16/2011  . Edema 03/30/2011  . OSTEOARTHRITIS 03/04/2010  . LIVER DISORDER 12/09/2009  . BACK PAIN, LUMBAR 09/09/2009  . ABDOMINAL PAIN, UNSPECIFIED SITE 01/31/2009  . SYNCOPE 01/08/2009  . ELBOW PAIN, LEFT 12/30/2008  . ABNORMAL ELECTROCARDIOGRAM 12/30/2008  . DEPRESSION 12/25/2008  . WEIGHT LOSS 12/25/2008  . NOSEBLEED 12/25/2008  . VITAMIN B12 DEFICIENCY 10/24/2008  . DM (diabetes mellitus), type 1, uncontrolled w/ophthalmic complication ([place]) 63/04/6008  . PANCYTOPENIA 10/08/2008  .  PSA, INCREASED 10/08/2008  . DYSURIA 10/03/2008  . LEG PAIN, BILATERAL 12/26/2007  . KNEE PAIN, LEFT 08/18/2007  . COUGH 04/18/2007  . Hyperlipidemia 02/17/2007  . ANEMIA-NOS 02/17/2007  . Essential hypertension 02/17/2007  . ALLERGIC RHINITIS 02/17/2007  . GERD 02/17/2007  . GASTROPARESIS 02/17/2007  . Benign prostatic hyperplasia 02/17/2007  . HYPERURICEMIA 02/17/2007    Past Surgical History:  Procedure Laterality Date  . LEFT  HEART CATHETERIZATION WITH CORONARY ANGIOGRAM N/A 04/23/2011   Procedure: LEFT HEART CATHETERIZATION WITH CORONARY ANGIOGRAM;  Surgeon: Peter M Martinique, MD;  Location: [facility] CATH LAB;  Service: Cardiovascular;  Laterality: N/A;  . TRANSURETHRAL RESECTION OF PROSTATE          Home Medications    Prior to Admission medications   Medication Sig Start Date End Date Taking? Authorizing Provider  ACCU-CHEK AVIVA PLUS test strip  05/01/14   [provider]  acetaminophen (TYLENOL) 500 MG tablet Take 500 mg by mouth 2 (two) times daily. Reported on 06/19/2015    [provider]  amLODipine (NORVASC) 10 MG tablet Take 10 mg by mouth every evening.  05/26/16   [provider]  B-D UF III MINI PEN NEEDLES 31G X 5 MM MISC  06/20/14   [provider]  calamine lotion Apply 1 application topically 2 (two) times daily as needed for itching (to shoulders/ back).    [provider]  calcitRIOL (ROCALTROL) 0.25 MCG capsule Take 0.25 mcg by mouth daily.  12/26/11   [provider]  cholecalciferol (VITAMIN D) 1000 UNITS tablet Take 2,000 Units by mouth daily.     [provider]  diclofenac sodium (VOLTAREN) 1 % GEL Apply 2 g topically 4 (four) times daily. Patient not taking: Reported on 10/04/2017 03/08/17   Leandrew Koyanagi, MD  fluticasone [facility]) 50 MCG/ACT nasal spray Place 1 spray into both nostrils 2 (two) times daily. Patient taking differently: Place 1 spray into both nostrils 2 (two) times daily as needed for allergies.  10/04/16   Doreatha Lew, MD  guaiFENesin (MUCINEX) 600 MG 12 hr tablet Take 2 tablets (1,200 mg total) by mouth 2 (two) times daily. Patient not taking: Reported on 10/04/2017 08/29/17   Shelly Coss, MD  hydrALAZINE (APRESOLINE) 25 MG tablet Take 75 mg by mouth 3 (three) times daily.  10/22/14   [provider]  Insulin Glargine (LANTUS SOLOSTAR) 100 UNIT/ML Solostar Pen Inject 22 Units into the skin daily after  breakfast.     [provider]  insulin lispro (HUMALOG) 100 UNIT/ML injection Inject 0-5 Units into the skin 3 (three) times daily as needed for high blood sugar (only if BGL is 150 or greater). Sliding scale     [provider]  latanoprost (XALATAN) 0.005 % ophthalmic solution Place 1 drop into both eyes at bedtime. 06/11/17   [provider]  levETIRAcetam (KEPPRA) 250 MG tablet Take 1 tablet (250 mg total) by mouth 2 (two) times daily. 10/03/17   Bonnielee Haff, MD  loratadine (CLARITIN) 10 MG tablet Take 10 mg by mouth daily with lunch.    [provider]  pantoprazole (PROTONIX) 40 MG tablet Take 40 mg by mouth daily.  05/04/15   [provider]  pindolol (VISKEN) 5 MG tablet Take 1 tablet (5 mg total) by mouth 2 (two) times daily. 10/03/17   Bonnielee Haff, MD  Polyethyl Glycol-Propyl Glycol (SYSTANE ULTRA) 0.4-0.3 % SOLN Place 1 drop into both eyes 3 (three) times daily as needed (dry eyes).  [provider]  polyethylene glycol powder (GLYCOLAX/MIRALAX) powder Take 17 g by mouth daily as needed for mild constipation.  05/18/14   [provider]  Probiotic Product (PROBIOTIC-10 PO) Take 1 capsule by mouth daily.    [provider]  senna (SENOKOT) 8.6 MG TABS Take 1 tablet by mouth daily.    [provider]  torsemide (DEMADEX) 10 MG tablet Take 10 mg by mouth daily. 09/15/17   [provider]  UNKNOWN TO PATIENT Apply 1 application topically See admin instructions. Unnamed ointment: Apply to buttocks 2 times a day to prevent skin breakdown    [provider]    Family History Family History  Problem Relation Age of Onset  . Cancer Father        had uncertain type of cancer  . Cancer Brother        Prostate Cancer  . Cancer Brother        Prostate Cancer  . Diabetes Other     Social History Social History   Tobacco Use  . Smoking status: Never Smoker  . Smokeless tobacco: Never  Used  Substance Use Topics  . Alcohol use: No  . Drug use: No     Allergies   Penicillins; Hydroxyzine; and Percocet [oxycodone-acetaminophen]   Review of Systems Review of Systems  Unable to perform ROS: Dementia     Physical Exam Updated Vital Signs BP 138/72 (BP Location: Left Arm)   Pulse 75   Temp 99 F (37.2 C) (Oral)   Resp 14   Ht 1.676 m (5\' 6" )   Wt 68.9 kg (152 lb)   SpO2 100%   BMI 24.53 kg/m   Physical Exam  Constitutional: He appears well-developed and well-nourished.  Non-toxic appearance. No distress.  HENT:  Head: Normocephalic and atraumatic.  Eyes: Pupils are equal, round, and reactive to light. Conjunctivae, EOM and lids are normal.  Neck: Normal range of motion. Neck supple. No tracheal deviation present. No thyroid mass present.  Cardiovascular: Normal rate, regular rhythm and normal heart sounds. Exam reveals no gallop.  No murmur heard. Pulmonary/Chest: Effort normal and breath sounds normal. No stridor. No respiratory distress. He has no decreased breath sounds. He has no wheezes. He has no rhonchi. He has no rales.  Abdominal: Soft. Normal appearance and bowel sounds are normal. He exhibits no distension. There is no tenderness. There is no rebound and no CVA tenderness.  Musculoskeletal: Normal range of motion. He exhibits no edema or tenderness.  Neurological: He is alert. He displays no tremor. No cranial nerve deficit or sensory deficit. GCS eye subscore is 4. GCS verbal subscore is 5. GCS motor subscore is 6.  Skin: Skin is warm and dry. No abrasion and no rash noted.  Psychiatric: His affect is blunt. His speech is delayed. He is withdrawn.  Nursing note and vitals reviewed.    ED Treatments / Results  Labs (all labs ordered are listed, but only abnormal results are displayed) Labs Reviewed - No data to display  EKG EKG Interpretation  Date/Time:  Monday October 17 2017 08:47:14 EDT Ventricular Rate:  76 PR Interval:    QRS  Duration: 86 QT Interval:  391 QTC Calculation: 440 R Axis:   13 Text Interpretation:  Sinus rhythm No significant change since last tracing Confirmed by Dorie Rank [phone]) on 10/17/2017 8:58:56 AM   Radiology No results found.  Procedures Procedures (including critical care time)  Medications Ordered in ED Medications - No data to  display   Initial Impression / Assessment and Plan / ED Course  I have reviewed the triage vital signs and the nursing notes.  Pertinent labs & imaging results that were available during my care of the patient were reviewed by me and considered in my medical decision making (see chart for details).     Patient monitored here and has no signs of decreased level of consciousness and his heart rate is been stable.  He was able to eat a meal without any difficulty.  According to his caregiver he is at baseline.  Return precautions given.  Final Clinical Impressions(s) / ED Diagnoses   Final diagnoses:  None    ED Discharge Orders    None       Lacretia Leigh, MD 10/17/17 1324

## 2017-10-17 NOTE — ED Triage Notes (Addendum)
EMS accidentally took all his prescribed daily meds this morning. Meds taken by daily med sheet Prescribed more than once a day: Tylenol 500mg   BID Fluticasone spray BID Mucinex 600mg  BID Hydralazine 75mg  TID Keppra 250mg  BID Pindolol 5mg  BID Pt took them approx 45 mimn PTA. Asymptomatic at present.

## 2017-10-17 NOTE — ED Notes (Signed)
MD at bedside to speak with pt and family.

## 2017-11-01 ENCOUNTER — Telehealth: Payer: Self-pay

## 2017-11-01 NOTE — Telephone Encounter (Signed)
Phone call placed to patient. Spoke with sister,Anna, to schedule a visit with Palliative care. Visit scheduled for 11/02/17.

## 2017-11-02 ENCOUNTER — Encounter: Payer: Self-pay | Admitting: Nurse Practitioner

## 2017-11-02 ENCOUNTER — Other Ambulatory Visit: Payer: Medicare Other | Admitting: Nurse Practitioner

## 2017-11-02 NOTE — Progress Notes (Signed)
PALLIATIVE CARE CONSULT VISIT   PATIENT NAME: Eric [name] DOB: [birth year] MRN: [id number]  PRIMARY CARE PROVIDER:   [name], MD  REFERRING PROVIDER:  [name], MD [place], [place] [address]  RESPONSIBLE PARTY:  Kirstie Mirza (sister) [phone]  ASSESSMENT:  84 year old man with multiple chronic medical problems including; Stage IV CKD.dCHF, bradycardia, seizure disorder,T2DM dementia, gait disturbance with history of falls.Patient requiring 24hr care and assistance with ADL's. Patient with frequent hospitalizations (approx monthly). Recently had initial consultation OV with Nephrologist who ordered Palliative Care to assist with coordination of care, symptom management along with ongoing support and education.    IMPRESSION/PLAN  Dementia without behavioral disturbance -history of wandering -history of failure to thrive -patient has 24/hr care with outside caregivers and family -patient is eating and drinking well at this time; able to feed himself. Requires assistance with bathing and putting on clothes -patient with previously took days medication at one time; medications now kept out of patient's reach and provided by staff or family  -family has staff at night due to patient's wandering  Gait disturbance with history of falls -patient uses walker and wheelchair with assistance of staff/family; has BSC -has history of falls but none in past 2-3 months  Gastroparesis/Constipation -patient on daily regimen of miralax, colace and sennna; patient with no BM for one week; MD notified by family and instructed to continue current regimen -educated family on signs and symptoms that may warrant medical attention; ie: nausea, vomiting; distended abdomen with pain -instructed family to increase miralax, colace (100mg ) and senna (one tab) to twice daily and use glycerin suppositories as needed; restart daily acitva which has worked in the past -If patient does  not have BM in next 48-72 hours call primary MD for further instructions       Chronic illness' including: Diastolic heart failure/Acute pulmonary edema/CAD/bradycardia/ 2nd degree heart block -seizure disorder - patient with no signs of volume overload at this time -family aware of signs of possible fluid overload (increase in weight; lower ext edema and increase of shortness of breath/wheezing) -has some Shortness of breath with exertion most likely 2nd to deconditioning -Heart rate today 78bpm -patient on Keppra (dilantin d/c'd due to bradycardia) -no recent seizure activity noted  Stage IV CKD - patient seen by nephrologist who is following sCr and electrolytes; no indication for dialysis at this time -family educated on diet and use OTC medications    ACP -Patient has DNR-L and MOST form -sister to find out if patient has designated HCPOA ; if not will start process. -Educated patient and family about Palliative Care goals;consent signed; will follow-up in one month    I spent 60 minutes providing this consultation,  from 3:30 to 4:30. More than 50% of the time in this consultation was spent coordinating communication.   HISTORY OF PRESENT ILLNESS:  Eric [name] is a 84 y.o. year old male with multiple medical problems including stage IV CKD, diastolic CHF, 2nd degree AVB, HTN, chronic anemia, Type 2 diabetes and dementia. Palliative Care was asked to help address goals of care; assist with symptom management and coordination of care; ongoing patient and family education and support.   CODE STATUS:  DNR-L; and MOST  PPS: 30% HOSPICE ELIGIBILITY/DIAGNOSIS: TBD  PAST MEDICAL HISTORY:  Past Medical History:  Diagnosis Date  . Adult failure to thrive   . Altered mental status   . ANEMIA-NOS 02/17/2007  . Anxiety   . BACK PAIN,  LUMBAR   . BENIGN PROSTATIC HYPERTROPHY   . Bilateral leg edema   . CAD (coronary artery disease)    a. nonobstructive CAD by cath in 2013  . CAP  (community acquired pneumonia)   . CKD (chronic kidney disease)   . DEPRESSION 12/25/2008  . Diabetes mellitus   . DIABETES MELLITUS, TYPE I, CONTROLLED, WITH RETINOPATHY   . Encephalopathy, metabolic   . Gastroparesis   . GERD 02/17/2007  . Hyperlipidemia   . HYPERTENSION 02/17/2007  . Hyponatremia   . LIVER DISORDER   . OSTEOARTHRITIS 03/04/2010  . Pneumonia   . PSA, INCREASED   . Stroke ([place])   . UTI (urinary tract infection) 07/2017  . VITAMIN B12 DEFICIENCY     SOCIAL HX:  Social History   Tobacco Use  . Smoking status: Never Smoker  . Smokeless tobacco: Never Used  Substance Use Topics  . Alcohol use: No    ALLERGIES:  Allergies  Allergen Reactions  . Penicillins Other (See Comments)    Has patient had a PCN reaction causing immediate rash, facial/tongue/throat swelling, SOB or lightheadedness with hypotension: Unk Has patient had a PCN reaction causing severe rash involving mucus membranes or skin necrosis: Unk Has patient had a PCN reaction that required hospitalization: Unk Has patient had a PCN reaction occurring within the last 10 years: Unk If all of the above answers are "NO", then may proceed with Cephalosporin use.   Marland Kitchen Hydroxyzine Rash  . Percocet [Oxycodone-Acetaminophen] Rash     PERTINENT MEDICATIONS:  Outpatient Encounter Medications as of 11/02/2017  Medication Sig  . ACCU-CHEK AVIVA PLUS test strip   . acetaminophen (TYLENOL) 500 MG tablet Take 500 mg by mouth 2 (two) times daily. Reported on 06/19/2015  . amLODipine (NORVASC) 10 MG tablet Take 10 mg by mouth every evening.   . B-D UF III MINI PEN NEEDLES 31G X 5 MM MISC   . calamine lotion Apply 1 application topically 2 (two) times daily as needed for itching (to shoulders/ back).  . calcitRIOL (ROCALTROL) 0.25 MCG capsule Take 0.25 mcg by mouth daily.   . cholecalciferol (VITAMIN D) 1000 UNITS tablet Take 2,000 Units by mouth daily.   . diclofenac sodium (VOLTAREN) 1 % GEL Apply 2 g topically 4  (four) times daily.  . fluticasone (FLONASE) 50 MCG/ACT nasal spray Place 1 spray into both nostrils 2 (two) times daily. (Patient taking differently: Place 1 spray into both nostrils 2 (two) times daily as needed for allergies. )  . hydroxypropyl methylcellulose / hypromellose (ISOPTO TEARS / GONIOVISC) 2.5 % ophthalmic solution Place 1 drop into both eyes 4 (four) times daily.  . Insulin Glargine (LANTUS SOLOSTAR) 100 UNIT/ML Solostar Pen Inject 22 Units into the skin daily after breakfast.   . insulin lispro (HUMALOG) 100 UNIT/ML injection Inject 0-5 Units into the skin 3 (three) times daily as needed for high blood sugar (only if BGL is 150 or greater). Sliding scale   . latanoprost (XALATAN) 0.005 % ophthalmic solution Place 1 drop into both eyes at bedtime.  . levETIRAcetam (KEPPRA) 250 MG tablet Take 1 tablet (250 mg total) by mouth 2 (two) times daily.  Marland Kitchen loratadine (CLARITIN) 10 MG tablet Take 10 mg by mouth daily with lunch.  . Menthol-Zinc Oxide (CALMOSEPTINE EX) Apply 1 application topically 2 (two) times daily.  . pantoprazole (PROTONIX) 40 MG tablet Take 40 mg by mouth daily.   . pindolol (VISKEN) 5 MG tablet Take 1 tablet (5 mg total) by mouth  2 (two) times daily.  . polyethylene glycol powder (GLYCOLAX/MIRALAX) powder Take 17 g by mouth daily as needed for mild constipation.   . Probiotic Product (PROBIOTIC-10 PO) Take 1 capsule by mouth daily.  Marland Kitchen torsemide (DEMADEX) 10 MG tablet Take 10 mg by mouth daily.  Marland Kitchen guaiFENesin (MUCINEX) 600 MG 12 hr tablet Take 2 tablets (1,200 mg total) by mouth 2 (two) times daily. (Patient not taking: Reported on 10/04/2017)  . hydrALAZINE (APRESOLINE) 25 MG tablet Take 75 mg by mouth 3 (three) times daily.    No facility-administered encounter medications on file as of 11/02/2017.     PHYSICAL EXAM:   General: NAD, well-dressed, well-groomed Cardiovascular: regular rate and rhythm; grade II/VI SEM Pulmonary: clear ant fields Abdomen: soft,  nontender, + bowel sounds GU: no suprapubic tenderness Extremities: trace BLE edema, no joint deformities Skin: no rashes Neurological: nonfocal  Bunyan Brier G Martinique, NP

## 2017-11-07 ENCOUNTER — Ambulatory Visit (INDEPENDENT_AMBULATORY_CARE_PROVIDER_SITE_OTHER): Payer: Medicare Other | Admitting: Adult Health

## 2017-11-07 ENCOUNTER — Encounter: Payer: Self-pay | Admitting: Adult Health

## 2017-11-07 VITALS — BP 156/70 | HR 69 | Ht 69.0 in | Wt 157.0 lb

## 2017-11-07 DIAGNOSIS — G459 Transient cerebral ischemic attack, unspecified: Secondary | ICD-10-CM

## 2017-11-07 DIAGNOSIS — N183 Chronic kidney disease, stage 3 unspecified: Secondary | ICD-10-CM

## 2017-11-07 DIAGNOSIS — Z794 Long term (current) use of insulin: Secondary | ICD-10-CM

## 2017-11-07 DIAGNOSIS — I10 Essential (primary) hypertension: Secondary | ICD-10-CM

## 2017-11-07 DIAGNOSIS — E1122 Type 2 diabetes mellitus with diabetic chronic kidney disease: Secondary | ICD-10-CM | POA: Diagnosis not present

## 2017-11-07 MED ORDER — LEVETIRACETAM 250 MG PO TABS: 250.0000 mg | ORAL_TABLET | Freq: Two times a day (BID) | ORAL | 4 refills | Status: AC

## 2017-11-07 NOTE — Progress Notes (Signed)
[facility]  PATIENT: Eric [name] DOB: 1923-05-08   REASON FOR VISIT: follow up for TIA  , memory loss HISTORY FROM: patient and caregiver  HISTORY OF PRESENT ILLNESS: HISTORY 12/30/14 PS 12 year African-American male seen today for first office follow-up visit for TIA from hospital admission in July 2016.84yo man with PMH of HTN, GERD< DM, BPH, HLD, CKD, Grade 1 diastolic CHF, dementia who presents for dysarthria. His symptoms started today at about 230pm after church. He was talking to a friend and noticed that his words were garbled. He knew what he was trying to say, but could not get it out. He also had right leg weakness and had to be helped out of his chair. The leg weakness has happened once before, Wednesday of this week, after exercising at the [facility]. His symptoms resolved by the time EMS had arrived. He has never had a stroke before. He denies falling, sensation changes, lightheadedness, dizziness, recent illness, nausea, vomiting, chest pain, SOB. Risk factors for stroke include HTN, DM, HLD. CT head done in the ED showed no acute pathology. MRI showed no acute pathology, mild atherosclerosis. Carotid Doppler showed no significant extracranial stenosis. MRA of the brain showed no large vessel stenosis. LDL cholesterol was elevated and hemoglobin A1c was 6.5. Patient was started on Plavix and is tolerating it well without bleeding or bruising. He also has long-standing history of mild dementia which appears stable on Aricept. He lives at home but his family provides some support and checks on him frequently. He walks with a cane because of his arthritis but has not had any major falls. Update 06/19/2015 PS: He returns for follow-up after last visit 6 months ago. He is accompanied by his sister and a provides most of the history. Patient continues to live alone but has AIDS during the daytime hours and is alone at night. Patient's sister lives nearby and checks  on him daily. His medications are given to him supervised. He has an emergency and that system. He is had no accident accidents, falls or injuries. The sister feels that he is cognitively doing a little better even though on the Mini-Mental status exam today he scored 18/30 which is a decline from 21/30 at last visit. Patient does not socialize a lot chart and spends most of his time at home. He is involved only in going out for church related activities. He has not had any agitations, delusions, hallucinations or unsafe behavior. He has had no recurrent TIA or stroke symptoms. He is tolerating aspirin without bleeding or bruising. His blood pressure is well controlled and today it is 124/72. He is tolerating Lipitor well without side effects. His fasting sugars have been well controlled UPDATE 9/5/17CM Eric [name], 84 year old male returns for follow-up with his sister. Patient continues to live alone but she checks on him frequently. He has assistance during the day. No recent falls. They both feel his memory is better at present. No wandering behavior and difficulty sleeping no hallucinations. He has not had recurrent TIA or stroke symptoms and is tolerating aspirin without significant bruising. He is also on Lipitor without muscle aches or myalgias. He returns for reevaluation UPDATE 03/06/2018CM Eric [name], 84 year old male returns for follow-up with his caregiver. He has a history of TIA  In July 2016. He is currently on Coumadin for secondary stroke prevention and hx of DVT.  He also has a history of chronic lower extremity edema secondary to DVT. His aspirin was changed to  Coumadin by his primary care. Patient continues to live in his own home but has someone checks on him frequently to administer medications and prepare meals. He also needs some help with bathing and dressing. Blood pressure in the office today 140/80. He remains on Lipitor for hypercholesterolemia, denies any myalgias. He continues to  have memory loss which predated his stroke. He is on Aricept 10 mg daily. He continues to be involved in church activities. Caregiver reports there have been no safety issues His caregiver feels his memory is stable. He returns for reevaluation Update 08/22/2017 : returns for follow-up after last visit a year ago with Eric [name], nurse practitioner. He is accompanied by his aide and sister. They're concerned that he may be having possible Complex partial seizures. The patient for the last couple of months has been having intermittent episodes of dozing off and been briefly unresponsive. He can pick up right away when aroused and is not disoriented or confused. There has been no witnessed tonic-clonic activity tongue bite mark twitchings noted. Patient was previously on phenytoin falling remote seizures and apparently had done well until recently. For some unclear reason his phenytoin dose was reduced from 300 mg at night to only 100 mg. The patient was diagnosed with urinary tract infection on and was admitted last month to the hospital. He is been on antibiotics. But since then his been quite sleepy during the day he can doze off easily in the middle of conversations. He can be aroused easily and does not appear to be disoriented or confused when aroused. There is no weakness tongue bite or tonic-clonic activity of her twitchings noted. The patient has not been sleeping well consistently at night. He is presently in a nursing home for rehabilitation. Is able to walk with a walker but does need one person assist. He does fall frequently.he was recently discharged from the hospital on 08/05/17 falling Escherichia coli urinary tract infection.I have reviewed the hospital discharge summary. CT scan of the head was obtained which showed no acute abnormality. Dilantin level was 6.9. EEG was not obtained.  11/07/17 UPDATE:  Since previous visit, patient was seen in the ED on 09/29/2017 after syncopal episode with EKG  showing second-degree AV block with transient bradycardic episodes.  This episode was not felt to be seizure related and related to bradycardia.  Dilantin was changed to Keppra due to Dilantin causing significant bradycardia.  After consult by cardiology, medication adjustments made to prevent further bradycardia episodes and patient was discharged in stable condition.  Patient admitted to ED again on 10/04/2017 with concerns of family members for possible left lower extremity weakness and left upper extremity numbness and tingling.  CT head and MRI negative for acute abnormality.  Patient was discharged back home in stable condition.  Patient is currently being followed at home by palliative care at request of nephrologist to assist with coordination of care, symptom management along with ongoing support and education.  Paitent was previously on coumadin due to chronic DVT but this was stopped by PCP in 2018 due to fall risk.   EEG on 09/20/2017 negative for seizure activity  Patient is being seen today and is accompanied by his son who is from [place] as patient's sister Eric [name] is unable to be present at appointment today.  Patient's son is able to provide some history as he lives out of state.  Patient states that he has been stable.  Son is unsure if he continues to have possible simple  complex partial seizures as described at previous visit consisting of dozing off and unresponsiveness.  He continues to live in his own home with 24-hour care.  He continues to tolerate Keppra without side effects.  MMSE performed today 13/30.    REVIEW OF SYSTEMS: Full 14 system review of systems performed and notable only for those listed, all others are neg: Memory loss   ALLERGIES: Allergies  Allergen Reactions  . Penicillins Other (See Comments)    Has patient had a PCN reaction causing immediate rash, facial/tongue/throat swelling, SOB or lightheadedness with hypotension: Unk Has patient had a PCN reaction  causing severe rash involving mucus membranes or skin necrosis: Unk Has patient had a PCN reaction that required hospitalization: Unk Has patient had a PCN reaction occurring within the last 10 years: Unk If all of the above answers are "NO", then may proceed with Cephalosporin use.   Marland Kitchen Hydroxyzine Rash  . Percocet [Oxycodone-Acetaminophen] Rash    HOME MEDICATIONS: Outpatient Medications Prior to Visit  Medication Sig Dispense Refill  . ACCU-CHEK AVIVA PLUS test strip     . acetaminophen (TYLENOL) 500 MG tablet Take 500 mg by mouth 2 (two) times daily. Reported on 06/19/2015    . amLODipine (NORVASC) 10 MG tablet Take 10 mg by mouth every evening.     . B-D UF III MINI PEN NEEDLES 31G X 5 MM MISC   3  . calamine lotion Apply 1 application topically 2 (two) times daily as needed for itching (to shoulders/ back).    . cholecalciferol (VITAMIN D) 1000 UNITS tablet Take 2,000 Units by mouth daily.     . fluticasone (FLONASE) 50 MCG/ACT nasal spray Place 1 spray into both nostrils 2 (two) times daily. (Patient taking differently: Place 1 spray into both nostrils 2 (two) times daily as needed for allergies. )  2  . guaiFENesin (MUCINEX) 600 MG 12 hr tablet Take 2 tablets (1,200 mg total) by mouth 2 (two) times daily. 10 tablet 0  . hydrALAZINE (APRESOLINE) 25 MG tablet Take 75 mg by mouth 3 (three) times daily.   1  . hydroxypropyl methylcellulose / hypromellose (ISOPTO TEARS / GONIOVISC) 2.5 % ophthalmic solution Place 1 drop into both eyes 4 (four) times daily.    . Insulin Glargine (LANTUS SOLOSTAR) 100 UNIT/ML Solostar Pen Inject 22 Units into the skin daily after breakfast.     . insulin lispro (HUMALOG) 100 UNIT/ML injection Inject 0-5 Units into the skin 3 (three) times daily as needed for high blood sugar (only if BGL is 150 or greater). Sliding scale     . latanoprost (XALATAN) 0.005 % ophthalmic solution Place 1 drop into both eyes at bedtime.    . levETIRAcetam (KEPPRA) 250 MG tablet  Take 1 tablet (250 mg total) by mouth 2 (two) times daily. 60 tablet 1  . loratadine (CLARITIN) 10 MG tablet Take 10 mg by mouth daily with lunch.    . Menthol-Zinc Oxide (CALMOSEPTINE EX) Apply 1 application topically 2 (two) times daily.    . pantoprazole (PROTONIX) 40 MG tablet Take 40 mg by mouth daily.     . pindolol (VISKEN) 5 MG tablet Take 1 tablet (5 mg total) by mouth 2 (two) times daily. 60 tablet 0  . polyethylene glycol powder (GLYCOLAX/MIRALAX) powder Take 17 g by mouth daily as needed for mild constipation.   12  . Probiotic Product (PROBIOTIC-10 PO) Take 1 capsule by mouth daily.    Marland Kitchen torsemide (DEMADEX) 10 MG tablet Take 10  mg by mouth daily.  1  . calcitRIOL (ROCALTROL) 0.25 MCG capsule Take 0.25 mcg by mouth daily.     . diclofenac sodium (VOLTAREN) 1 % GEL Apply 2 g topically 4 (four) times daily. 1 Tube 5   No facility-administered medications prior to visit.     PAST MEDICAL HISTORY: Past Medical History:  Diagnosis Date  . Adult failure to thrive   . Altered mental status   . ANEMIA-NOS 02/17/2007  . Anxiety   . BACK PAIN, LUMBAR   . BENIGN PROSTATIC HYPERTROPHY   . Bilateral leg edema   . CAD (coronary artery disease)    a. nonobstructive CAD by cath in 2013  . CAP (community acquired pneumonia)   . CKD (chronic kidney disease)   . DEPRESSION 12/25/2008  . Diabetes mellitus   . DIABETES MELLITUS, TYPE I, CONTROLLED, WITH RETINOPATHY   . Encephalopathy, metabolic   . Gastroparesis   . GERD 02/17/2007  . Hyperlipidemia   . HYPERTENSION 02/17/2007  . Hyponatremia   . LIVER DISORDER   . OSTEOARTHRITIS 03/04/2010  . Pneumonia   . PSA, INCREASED   . Stroke ([facility])   . UTI (urinary tract infection) 07/2017  . VITAMIN B12 DEFICIENCY     PAST SURGICAL HISTORY: Past Surgical History:  Procedure Laterality Date  . LEFT HEART CATHETERIZATION WITH CORONARY ANGIOGRAM N/A 04/23/2011   Procedure: LEFT HEART CATHETERIZATION WITH CORONARY ANGIOGRAM;  Surgeon: Peter M  Martinique, MD;  Location: [facility] CATH LAB;  Service: Cardiovascular;  Laterality: N/A;  . TRANSURETHRAL RESECTION OF PROSTATE      FAMILY HISTORY: Family History  Problem Relation Age of Onset  . Cancer Father        had uncertain type of cancer  . Cancer Brother        Prostate Cancer  . Cancer Brother        Prostate Cancer  . Diabetes Other     SOCIAL HISTORY: Social History   Socioeconomic History  . Marital status: Widowed    Spouse name: Not on file  . Number of children: Not on file  . Years of education: Not on file  . Highest education level: Not on file  Occupational History  . 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 Financial resource strain: Not on file  . Food insecurity:    Worry: Not on file    Inability: Not on file  . Transportation needs:    Medical: Not on file    Non-medical: Not on file  Tobacco Use  . Smoking status: Never Smoker  . Smokeless tobacco: Never Used  Substance and Sexual Activity  . Alcohol use: No  . Drug use: No  . Sexual activity: Never  Lifestyle  . Physical activity:    Days per week: Not on file    Minutes per session: Not on file  . Stress: Not on file  Relationships  . Social connections:    Talks on phone: Not on file    Gets together: Not on file    Attends religious service: Not on file    Active member of club or organization: Not on file    Attends meetings of clubs or organizations: Not on file    Relationship status: Not on file  . Intimate partner violence:    Fear of current or ex partner: Not on file    Emotionally abused: Not on file    Physically abused: Not  on file    Forced sexual activity: Not on file  Other Topics Concern  . Not on file  Social History Narrative   Works Armed forces operational officer.   Widowed 2010.   Regular exercise-no     PHYSICAL EXAM  Vitals:   06/22/16 0854  BP: (!) 140/80  Pulse: 79  Weight: 185 lb (83.9 kg)   Body mass index is 23.18  kg/m.  Generalized: frail, elderly male in no acute distress  Head: normocephalic and atraumatic,.   Neck: Supple, no carotid bruits  Cardiac: Regular rate rhythm, no murmur  Musculoskeletal: Severe kyphosis Skin bilateral pedal edema  Neurological examination   Mentation:  MMSE below.  Previously 16/30.  Attention span and concentration diminished. Recent and remote memory poor.  Follows all commands speech and language fluent.  MMSE - Mini Mental State Exam 11/07/2017 06/22/2016 12/23/2015  Orientation to time 0 1 3  Orientation to Place 1 4 4   Registration 3 3 3   Attention/ Calculation 1 0 3  Recall 0 0 3  Language- name 2 objects 2 2 2   Language- repeat 1 1 1   Language- follow 3 step command 3 3 3   Language- read & follow direction 1 1 1   Write a sentence 1 0 0  Copy design 0 1 1  Total score 13 16 24    Cranial nerve II-XII: Fundoscopic exam not donePupils were equal round reactive to light extraocular movements were full, visual field were full on confrontational test. Facial sensation and strength were normal. hearing was intact to finger rubbing bilaterally. Uvula tongue midline. head turning and shoulder shrug were normal and symmetric.Tongue protrusion into cheek strength was normal. Motor: normal bulk and tone, full strength in the BUE, BLE, fine finger movements normal, no pronator drift.  Sensory: normal and symmetric to light touch, pinprick, and  Vibration, in the upper and lower extremities Coordination: finger-nose-finger, heel-to-shin bilaterally, no dysmetria, no tremor Reflexes: 1+ upper lower and symmetric, plantar responses were flexor bilaterally. Gait and Station: Patient ambulating with rolling walker and takes small, slow cautious steps but is able to use walker without assistance   DIAGNOSTIC DATA (LABS, IMAGING, TESTING) - I reviewed patient records, labs, notes, testing and imaging myself where available.  Lab Results  Component Value Date   WBC 5.1  10/04/2017   HGB 8.5 (L) 10/04/2017   HCT 25.0 (L) 10/04/2017   MCV 104.2 (H) 10/04/2017   PLT 194 10/04/2017      Component Value Date/Time   NA 138 10/04/2017 1539   K 4.1 10/04/2017 1539   CL 106 10/04/2017 1539   CO2 20 (L) 10/04/2017 1523   GLUCOSE 114 (H) 10/04/2017 1539   BUN 71 (H) 10/04/2017 1539   CREATININE 2.90 (H) 10/04/2017 1539   CALCIUM 9.5 10/04/2017 1523   CALCIUM 8.8 04/29/2011 1005   PROT 6.6 10/04/2017 1523   ALBUMIN 2.7 (L) 10/04/2017 1523   AST 31 10/04/2017 1523   ALT 29 10/04/2017 1523   ALKPHOS 112 10/04/2017 1523   BILITOT 0.2 (L) 10/04/2017 1523   GFRNONAA 17 (L) 10/04/2017 1523   GFRAA 20 (L) 10/04/2017 1523   Lab Results  Component Value Date   CHOL 159 11/10/2014   HDL 31 (L) 11/10/2014   LDLCALC 106 (H) 11/10/2014   LDLDIRECT 40.5 10/28/2006   TRIG 112 11/10/2014   CHOLHDL 5.1 11/10/2014   Lab Results  Component Value Date   HGBA1C 6.0 (H) 08/27/2017       ASSESSMENT AND PLAN  49 year African-American male with left hemispheric TIA in July 2016 with vascular risk factors of hypertension, hyperlipidemia, age and sex. Chronic DVT.  Mild underlying dementia which appears stable.  Patient returns today accompanied by his son and overall has been stable.   -aricept stopped by ED for possible cause of bradycardia on 09/29/17 -dilantin switched to Keppra 250mg  BID on 09/29/17 for possible cause of bradycardia -recommended to continue Keppra 250 twice daily at this time for continued seizure prophylaxis -F/u with PCP regarding your HLD, DM and HTN management -continue to monitor BP at home -Maintain strict control of hypertension with blood pressure goal below 130/90, diabetes with hemoglobin A1c goal below 6.5% and cholesterol with LDL cholesterol (bad cholesterol) goal below 70 mg/dL. I also advised the patient to eat a healthy diet with plenty of whole grains, cereals, fruits and vegetables, exercise regularly and maintain ideal body  weight.  Follow up in 6 months or call earlier if needed  Greater than 50% of time during this 25 minute visit was spent on counseling,explanation of diagnosis of history of TIA and seizures, reviewing risk factor management of HLD and HTN, planning of further management, discussion with patient and family and coordination of care  Eric [name], AGNP-BC  [facility] [address] [place], [place] [address]  Phone [phone] Fax [phone] Note: This document was prepared with digital dictation and possible smart phrase technology. Any transcriptional errors that result from this process are unintentional.

## 2017-11-07 NOTE — Patient Instructions (Signed)
Continue to follow up with PCP regarding cholesterol, diabetes and blood pressure management   Continue keppra for seizure prevention  Continue to monitor blood pressure at home  Maintain strict control of hypertension with blood pressure goal below 130/90, diabetes with hemoglobin A1c goal below 6.5% and cholesterol with LDL cholesterol (bad cholesterol) goal below 70 mg/dL. I also advised the patient to eat a healthy diet with plenty of whole grains, cereals, fruits and vegetables, exercise regularly and maintain ideal body weight.  Followup in the future with me in 6 months or call earlier if needed       Thank you for coming to see [place] at [facility]. I hope we have been able to provide you high quality care today.  You may receive a patient satisfaction survey over the next few weeks. We would appreciate your feedback and comments so that we may continue to improve ourselves and the health of our patients.

## 2017-11-08 NOTE — Progress Notes (Signed)
I agree with the above plan 

## 2017-11-22 ENCOUNTER — Ambulatory Visit: Payer: Medicare Other | Admitting: Adult Health

## 2017-12-07 ENCOUNTER — Telehealth: Payer: Self-pay

## 2017-12-07 NOTE — Telephone Encounter (Signed)
Phone call placed to patient to offer to schedule a follow up visit with Palliative Care. VM left.

## 2017-12-20 ENCOUNTER — Telehealth: Payer: Self-pay

## 2017-12-20 NOTE — Telephone Encounter (Signed)
Received message from [place], patient's caregiver, requesting a call back. Phone call returned. Caren Griffins not available at this time and requested a call back

## 2018-01-20 ENCOUNTER — Telehealth: Payer: Self-pay

## 2018-01-20 NOTE — Telephone Encounter (Signed)
Phone call placed to patient's sister to schedule FU with Palliative Care. Visit scheduled for Wednesday 01/25/18

## 2018-01-25 ENCOUNTER — Other Ambulatory Visit: Payer: Medicare Other | Admitting: Nurse Practitioner

## 2018-01-25 DIAGNOSIS — I5032 Chronic diastolic (congestive) heart failure: Secondary | ICD-10-CM

## 2018-01-25 DIAGNOSIS — F0390 Unspecified dementia without behavioral disturbance: Secondary | ICD-10-CM

## 2018-01-25 DIAGNOSIS — R269 Unspecified abnormalities of gait and mobility: Secondary | ICD-10-CM

## 2018-01-25 DIAGNOSIS — N184 Chronic kidney disease, stage 4 (severe): Secondary | ICD-10-CM

## 2018-01-25 DIAGNOSIS — Z515 Encounter for palliative care: Secondary | ICD-10-CM

## 2018-01-25 NOTE — Progress Notes (Signed)
PALLIATIVE CARE CONSULT VISIT   PATIENT NAME: Eric [name] DOB: [birth year] MRN: [id number]  PRIMARY CARE PROVIDER:   [name], MD  REFERRING PROVIDER:  [name], MD [place], [place] [address]  RESPONSIBLE PARTY:  Kirstie Mirza (sister) [phone]  ASSESSMENT:  84 year old man with multiple chronic medical problems including; Stage IV CKD.dCHF, bradycardia, seizure disorder,T2DM dementia, gait disturbance with history of falls.Patient requiring 24hr care and assistance with ADL's. Patient with frequent hospitalizations (approx monthly). Recently had initial consultation OV with Nephrologist who ordered Palliative Care to assist with coordination of care, symptom management along with ongoing support and education.  Interim History:    IMPRESSION/PLAN  Dementia without behavioral disturbance -history of wandering -history of failure to thrive -patient has 24/hr care with outside caregivers and family -patient is eating and drinking well at this time; able to feed himself. Requires assistance with bathing and putting on clothes -patient with previously took days medication at one time; medications now kept out of patient's reach and provided by staff or family  -family has staff at night due to patient's wandering  Gait disturbance with history of falls -patient uses walker and wheelchair with assistance of staff/family; has BSC -has history of falls but none in past 2-3 months  Gastroparesis/Constipation -patient on daily regimen of miralax, colace and sennna; patient with no BM for one week; MD notified by family and instructed to continue current regimen -educated family on signs and symptoms that may warrant medical attention; ie: nausea, vomiting; distended abdomen with pain -instructed family to increase miralax, colace (100mg ) and senna (one tab) to twice daily and use glycerin suppositories as needed; restart daily acitva which has worked in the  past -If patient does not have BM in next 48-72 hours call primary MD for further instructions       Chronic illness' including: Diastolic heart failure/Acute pulmonary edema/CAD/bradycardia/ 2nd degree heart block -seizure disorder - patient with no signs of volume overload at this time -family aware of signs of possible fluid overload (increase in weight; lower ext edema and increase of shortness of breath/wheezing) -has some Shortness of breath with exertion most likely 2nd to deconditioning -Heart rate today 78bpm -patient on Keppra (dilantin d/c'd due to bradycardia) -no recent seizure activity noted  Stage IV CKD - patient seen by nephrologist who is following sCr and electrolytes; no indication for dialysis at this time -family educated on diet and use OTC medications    ACP -Patient has DNR-L and MOST form -sister to find out if patient has designated HCPOA ; if not will start process. -Educated patient and family about Palliative Care goals;consent signed; will follow-up in one month    I spent 60 minutes providing this consultation,  from 3:30 to 4:30. More than 50% of the time in this consultation was spent coordinating communication.   HISTORY OF PRESENT ILLNESS:  Eric [name] is a 84 y.o. year old male with multiple medical problems including stage IV CKD, diastolic CHF, 2nd degree AVB, HTN, chronic anemia, Type 2 diabetes and dementia. Palliative Care was asked to help address goals of care; assist with symptom management and coordination of care; ongoing patient and family education and support.   CODE STATUS:  DNR-L; and MOST  PPS: 30% HOSPICE ELIGIBILITY/DIAGNOSIS: TBD  PAST MEDICAL HISTORY:  Past Medical History:  Diagnosis Date  . Adult failure to thrive   . Altered mental status   . ANEMIA-NOS 02/17/2007  . Anxiety   .  BACK PAIN, LUMBAR   . BENIGN PROSTATIC HYPERTROPHY   . Bilateral leg edema   . CAD (coronary artery disease)    a. nonobstructive CAD by  cath in 2013  . CAP (community acquired pneumonia)   . CKD (chronic kidney disease)   . DEPRESSION 12/25/2008  . Diabetes mellitus   . DIABETES MELLITUS, TYPE I, CONTROLLED, WITH RETINOPATHY   . Encephalopathy, metabolic   . Gastroparesis   . GERD 02/17/2007  . Hyperlipidemia   . HYPERTENSION 02/17/2007  . Hyponatremia   . LIVER DISORDER   . OSTEOARTHRITIS 03/04/2010  . Pneumonia   . PSA, INCREASED   . Stroke ([place])   . UTI (urinary tract infection) 07/2017  . VITAMIN B12 DEFICIENCY     SOCIAL HX:  Social History   Tobacco Use  . Smoking status: Never Smoker  . Smokeless tobacco: Never Used  Substance Use Topics  . Alcohol use: No    ALLERGIES:  Allergies  Allergen Reactions  . Penicillins Other (See Comments)    Has patient had a PCN reaction causing immediate rash, facial/tongue/throat swelling, SOB or lightheadedness with hypotension: Unk Has patient had a PCN reaction causing severe rash involving mucus membranes or skin necrosis: Unk Has patient had a PCN reaction that required hospitalization: Unk Has patient had a PCN reaction occurring within the last 10 years: Unk If all of the above answers are "NO", then may proceed with Cephalosporin use.   Marland Kitchen Hydroxyzine Rash  . Percocet [Oxycodone-Acetaminophen] Rash     PERTINENT MEDICATIONS:  Outpatient Encounter Medications as of 01/25/2018  Medication Sig  . ACCU-CHEK AVIVA PLUS test strip   . acetaminophen (TYLENOL) 500 MG tablet Take 500 mg by mouth 2 (two) times daily. Reported on 06/19/2015  . amLODipine (NORVASC) 10 MG tablet Take 10 mg by mouth every evening.   . B-D UF III MINI PEN NEEDLES 31G X 5 MM MISC   . calamine lotion Apply 1 application topically 2 (two) times daily as needed for itching (to shoulders/ back).  . cholecalciferol (VITAMIN D) 1000 UNITS tablet Take 2,000 Units by mouth daily.   Marland Kitchen docusate sodium (COLACE) 100 MG capsule Take 100 mg by mouth 2 (two) times daily.  . fluticasone (FLONASE) 50  MCG/ACT nasal spray Place 1 spray into both nostrils 2 (two) times daily. (Patient taking differently: Place 1 spray into both nostrils 2 (two) times daily as needed for allergies. )  . guaiFENesin (MUCINEX) 600 MG 12 hr tablet Take 2 tablets (1,200 mg total) by mouth 2 (two) times daily.  . hydrALAZINE (APRESOLINE) 25 MG tablet Take 75 mg by mouth 3 (three) times daily.   . hydroxypropyl methylcellulose / hypromellose (ISOPTO TEARS / GONIOVISC) 2.5 % ophthalmic solution Place 1 drop into both eyes 4 (four) times daily.  . Insulin Glargine (LANTUS SOLOSTAR) 100 UNIT/ML Solostar Pen Inject 22 Units into the skin daily after breakfast.   . insulin lispro (HUMALOG) 100 UNIT/ML injection Inject 0-5 Units into the skin 3 (three) times daily as needed for high blood sugar (only if BGL is 150 or greater). Sliding scale   . latanoprost (XALATAN) 0.005 % ophthalmic solution Place 1 drop into both eyes at bedtime.  . levETIRAcetam (KEPPRA) 250 MG tablet Take 1 tablet (250 mg total) by mouth 2 (two) times daily.  Marland Kitchen loratadine (CLARITIN) 10 MG tablet Take 10 mg by mouth daily with lunch.  . Menthol-Zinc Oxide (CALMOSEPTINE EX) Apply 1 application topically 2 (two) times daily.  Marland Kitchen  pantoprazole (PROTONIX) 40 MG tablet Take 40 mg by mouth daily.   . pindolol (VISKEN) 5 MG tablet Take 1 tablet (5 mg total) by mouth 2 (two) times daily.  . polyethylene glycol powder (GLYCOLAX/MIRALAX) powder Take 17 g by mouth daily as needed for mild constipation.   . Probiotic Product (PROBIOTIC-10 PO) Take 1 capsule by mouth daily.  Marland Kitchen senna (SENOKOT) 8.6 MG TABS tablet Take 1 tablet by mouth.  . torsemide (DEMADEX) 10 MG tablet Take 10 mg by mouth daily.   No facility-administered encounter medications on file as of 01/25/2018.     PHYSICAL EXAM:   General: NAD, well-dressed, well-groomed Cardiovascular: regular rate and rhythm; grade II/VI SEM Pulmonary: clear ant fields Abdomen: soft, nontender, + bowel sounds GU: no  suprapubic tenderness Extremities: trace BLE edema, no joint deformities Skin: no rashes Neurological: nonfocal  Windle Huebert G Martin[place], NP

## 2018-01-26 ENCOUNTER — Telehealth: Payer: Self-pay

## 2018-01-26 ENCOUNTER — Other Ambulatory Visit: Payer: Medicare Other | Admitting: Nurse Practitioner

## 2018-01-26 DIAGNOSIS — R269 Unspecified abnormalities of gait and mobility: Secondary | ICD-10-CM

## 2018-01-26 DIAGNOSIS — R627 Adult failure to thrive: Secondary | ICD-10-CM

## 2018-01-26 DIAGNOSIS — K5900 Constipation, unspecified: Secondary | ICD-10-CM

## 2018-01-26 DIAGNOSIS — Z515 Encounter for palliative care: Secondary | ICD-10-CM

## 2018-01-26 DIAGNOSIS — F0390 Unspecified dementia without behavioral disturbance: Secondary | ICD-10-CM

## 2018-01-26 NOTE — Telephone Encounter (Signed)
Phone call placed to patient's sister to confirm that visit was rescheduled for today. Rang with no answer, no VM

## 2018-01-26 NOTE — Progress Notes (Signed)
PALLIATIVE CARE CONSULT VISIT   PATIENT NAME: Eric [name] DOB: 09/29/1923 MRN: [id number]  PRIMARY CARE PROVIDER:   [name], MD  REFERRING PROVIDER:  [name], MD [place], [place] [address]  RESPONSIBLE PARTY:  Kirstie Mirza (sister) [phone]  ASSESSMENT:  84 year old man with multiple chronic medical problems including; Stage IV CKD.dCHF, bradycardia, seizure disorder,T2DM dementia, gait disturbance with history of falls.Patient requiring 24hr care and assistance with ADL's. Patient with frequent hospitalizations (approx monthly). Recently had initial consultation OV with Nephrologist who ordered Palliative Care to assist with coordination of care, symptom management along with ongoing support and education.  Interim History: sister reports no significant changes; patient is eating well; maintaing weight, walker with walker, has occasional constipation that responds to dulcolax supp.    IMPRESSION/PLAN  Dementia without behavioral disturbance -history of wandering -history of failure to thrive -patient has 24/hr care with outside caregivers and family -patient is eating and drinking well at this time; able to feed himself. Requires assistance with bathing and putting on clothes -family has staff at night due to patient's wandering  Gait disturbance with history of falls -patient uses walker and wheelchair with assistance of staff/family; has BSC -has history of falls but none in past 2-3 months  Gastroparesis/Constipation -PRN dulcolax instructed to continue current regimen -educated family on signs and symptoms that may warrant medical attention; ie: nausea, vomiting; distended abdomen with pain -instructed family to increase miralax, colace (100mg ) and senna (one tab) to twice daily and use glycerin suppositories as needed; restart daily acitva which has worked in the past      Chronic illness' including: Diastolic heart failure/Acute pulmonary  edema/CAD/bradycardia/ 2nd degree heart block -seizure disorder - patient with no signs of volume overload at this time -family aware of signs of possible fluid overload (increase in weight; lower ext edema and increase of shortness of breath/wheezing) -has some Shortness of breath with exertion most likely 2nd to deconditioning -Heart rate today 78bpm -patient on Keppra (dilantin d/c'd due to bradycardia) -no recent seizure activity noted  Stage IV CKD - patient seen by nephrologist who is following sCr and electrolytes; no indication for dialysis at this time -family educated on diet and use OTC medications    ACP -Patient has DNR-L and MOST form -sister to find out if patient has designated HCPOA ; if not will start process. -Educated patient and family about Palliative Care goals;consent signed; will follow-up in one month    I spent 30 minutes providing this consultation,  from 3:30 to 4:00. More than 50% of the time in this consultation was spent coordinating communication.   HISTORY OF PRESENT ILLNESS:  Eric [name] is a 84 y.o. year old male with multiple medical problems including stage IV CKD, diastolic CHF, 2nd degree AVB, HTN, chronic anemia, Type 2 diabetes and dementia. Palliative Care was asked to help address goals of care; assist with symptom management and coordination of care; ongoing patient and family education and support.   CODE STATUS:  DNR-L; and MOST  PPS: 30% HOSPICE ELIGIBILITY/DIAGNOSIS: TBD  PAST MEDICAL HISTORY:  Past Medical History:  Diagnosis Date  . Adult failure to thrive   . Altered mental status   . ANEMIA-NOS 02/17/2007  . Anxiety   . BACK PAIN, LUMBAR   . BENIGN PROSTATIC HYPERTROPHY   . Bilateral leg edema   . CAD (coronary artery disease)    a. nonobstructive CAD by cath in 2013  . CAP (community  acquired pneumonia)   . CKD (chronic kidney disease)   . DEPRESSION 12/25/2008  . Diabetes mellitus   . DIABETES MELLITUS, TYPE I, CONTROLLED,  WITH RETINOPATHY   . Encephalopathy, metabolic   . Gastroparesis   . GERD 02/17/2007  . Hyperlipidemia   . HYPERTENSION 02/17/2007  . Hyponatremia   . LIVER DISORDER   . OSTEOARTHRITIS 03/04/2010  . Pneumonia   . PSA, INCREASED   . Stroke ([place])   . UTI (urinary tract infection) 07/2017  . VITAMIN B12 DEFICIENCY     SOCIAL HX:  Social History   Tobacco Use  . Smoking status: Never Smoker  . Smokeless tobacco: Never Used  Substance Use Topics  . Alcohol use: No    ALLERGIES:  Allergies  Allergen Reactions  . Penicillins Other (See Comments)    Has patient had a PCN reaction causing immediate rash, facial/tongue/throat swelling, SOB or lightheadedness with hypotension: Unk Has patient had a PCN reaction causing severe rash involving mucus membranes or skin necrosis: Unk Has patient had a PCN reaction that required hospitalization: Unk Has patient had a PCN reaction occurring within the last 10 years: Unk If all of the above answers are "NO", then may proceed with Cephalosporin use.   Marland Kitchen Hydroxyzine Rash  . Percocet [Oxycodone-Acetaminophen] Rash     PERTINENT MEDICATIONS:  Outpatient Encounter Medications as of 01/26/2018  Medication Sig  . ACCU-CHEK AVIVA PLUS test strip   . acetaminophen (TYLENOL) 500 MG tablet Take 500 mg by mouth 2 (two) times daily. Reported on 06/19/2015  . amLODipine (NORVASC) 10 MG tablet Take 10 mg by mouth every evening.   . B-D UF III MINI PEN NEEDLES 31G X 5 MM MISC   . calamine lotion Apply 1 application topically 2 (two) times daily as needed for itching (to shoulders/ back).  . cholecalciferol (VITAMIN D) 1000 UNITS tablet Take 2,000 Units by mouth daily.   Marland Kitchen docusate sodium (COLACE) 100 MG capsule Take 100 mg by mouth 2 (two) times daily.  . fluticasone (FLONASE) 50 MCG/ACT nasal spray Place 1 spray into both nostrils 2 (two) times daily. (Patient taking differently: Place 1 spray into both nostrils 2 (two) times daily as needed for  allergies. )  . guaiFENesin (MUCINEX) 600 MG 12 hr tablet Take 2 tablets (1,200 mg total) by mouth 2 (two) times daily.  . hydrALAZINE (APRESOLINE) 25 MG tablet Take 75 mg by mouth 3 (three) times daily.   . hydroxypropyl methylcellulose / hypromellose (ISOPTO TEARS / GONIOVISC) 2.5 % ophthalmic solution Place 1 drop into both eyes 4 (four) times daily.  . Insulin Glargine (LANTUS SOLOSTAR) 100 UNIT/ML Solostar Pen Inject 22 Units into the skin daily after breakfast.   . insulin lispro (HUMALOG) 100 UNIT/ML injection Inject 0-5 Units into the skin 3 (three) times daily as needed for high blood sugar (only if BGL is 150 or greater). Sliding scale   . latanoprost (XALATAN) 0.005 % ophthalmic solution Place 1 drop into both eyes at bedtime.  . levETIRAcetam (KEPPRA) 250 MG tablet Take 1 tablet (250 mg total) by mouth 2 (two) times daily.  Marland Kitchen loratadine (CLARITIN) 10 MG tablet Take 10 mg by mouth daily with lunch.  . Menthol-Zinc Oxide (CALMOSEPTINE EX) Apply 1 application topically 2 (two) times daily.  . pantoprazole (PROTONIX) 40 MG tablet Take 40 mg by mouth daily.   . pindolol (VISKEN) 5 MG tablet Take 1 tablet (5 mg total) by mouth 2 (two) times daily.  . polyethylene glycol powder (  GLYCOLAX/MIRALAX) powder Take 17 g by mouth daily as needed for mild constipation.   . Probiotic Product (PROBIOTIC-10 PO) Take 1 capsule by mouth daily.  Marland Kitchen senna (SENOKOT) 8.6 MG TABS tablet Take 1 tablet by mouth.  . torsemide (DEMADEX) 10 MG tablet Take 10 mg by mouth daily.   No facility-administered encounter medications on file as of 01/26/2018.     PHYSICAL EXAM:   General: NAD, well-dressed, well-groomed Cardiovascular: regular rate and rhythm; grade II/VI SEM Pulmonary: clear ant fields Abdomen: soft, nontender, + bowel sounds GU: no suprapubic tenderness Extremities: trace BLE edema, no joint deformities Skin: no rashes Neurological: nonfocal  Stephanie G Martinique, NP

## 2018-05-10 ENCOUNTER — Encounter: Payer: Self-pay | Admitting: Adult Health

## 2018-05-10 ENCOUNTER — Ambulatory Visit: Payer: Medicare Other | Admitting: Adult Health

## 2018-05-10 VITALS — BP 175/85 | HR 81 | Ht 69.0 in | Wt 180.0 lb

## 2018-05-10 DIAGNOSIS — Z8673 Personal history of transient ischemic attack (TIA), and cerebral infarction without residual deficits: Secondary | ICD-10-CM | POA: Diagnosis not present

## 2018-05-10 DIAGNOSIS — F0150 Vascular dementia without behavioral disturbance: Secondary | ICD-10-CM

## 2018-05-10 DIAGNOSIS — E1122 Type 2 diabetes mellitus with diabetic chronic kidney disease: Secondary | ICD-10-CM

## 2018-05-10 DIAGNOSIS — G40909 Epilepsy, unspecified, not intractable, without status epilepticus: Secondary | ICD-10-CM

## 2018-05-10 DIAGNOSIS — I10 Essential (primary) hypertension: Secondary | ICD-10-CM | POA: Diagnosis not present

## 2018-05-10 DIAGNOSIS — E785 Hyperlipidemia, unspecified: Secondary | ICD-10-CM

## 2018-05-10 DIAGNOSIS — Z794 Long term (current) use of insulin: Secondary | ICD-10-CM

## 2018-05-10 DIAGNOSIS — N183 Chronic kidney disease, stage 3 (moderate): Secondary | ICD-10-CM

## 2018-05-10 NOTE — Patient Instructions (Signed)
Speak with PCP regarding possibly starting aspirin 81mg  daily for secondary stroke prevention   Continue to follow up with PCP regarding cholesterol, blood pressure and diabetes management   Continue keppra 250mg  twice daily for seizure prevention  Continue to monitor blood pressure at home  Maintain strict control of hypertension with blood pressure goal below 130/90, diabetes with hemoglobin A1c goal below 6.5% and cholesterol with LDL cholesterol (bad cholesterol) goal below 70 mg/dL. I also advised the patient to eat a healthy diet with plenty of whole grains, cereals, fruits and vegetables, exercise regularly and maintain ideal body weight.  Followup in the future with me in 6 months or call earlier if needed       Thank you for coming to see [place] at [facility]. I hope we have been able to provide you high quality care today.  You may receive a patient satisfaction survey over the next few weeks. We would appreciate your feedback and comments so that we may continue to improve ourselves and the health of our patients.

## 2018-05-10 NOTE — Progress Notes (Signed)
[facility]  PATIENT: Eric [name] DOB: 1923/05/04   REASON FOR VISIT: follow up for TIA  , memory loss HISTORY FROM: patient and caregiver  Chief Complaint  Patient presents with  . Follow-up    Memory and Seizure follow up room in back hallway pt with sister Eric [name].PEr sister no seizure acitivity since last visit     HISTORY OF PRESENT ILLNESS:  Eric [name] is a 85 year old African-American male with PMH of HTN, DM, BPH, HLD, CKD, DVT previously on Coumadin, CHF, TIA (2016), seizures and dementia.  At prior visits, patient had reported episodes of intermittent episodes of dozing off and briefly unresponsive but did not know any postictal time after.  He was started on Dilantin at this time.  EEG on 09/20/2017 negative for seizure activity.  Patient was admitted to ED on 09/29/2017 for syncopal episode with second-degree AV block and transient bradycardic episodes.  Dilantin was changed to Keppra due to possible cause of significant bradycardia.  Patient is being seen today for 90-month follow-up visit.  MMSE 18/30 with prior 13/30. Sister states he has been doing well with improvement since the summer.  He is using a RW for ambulation without reported falls. Continues on Keppra without reported side effects or recurrent seizure activity. Sister states he is more awake during the day and sleeping well at night. He continues to live independently with aide assistance majority of the day. Blood pressure today 175/85 and typically lower at home as reported by sister. This is monitored daily at home.  He does continue to follow with his PCP routinely every 3 months.  No further concerns at this time.  Denies new or worsening stroke/TIA symptoms.   REVIEW OF SYSTEMS: Full 14 system review of systems performed and notable only for those listed, all others are neg: Hearing loss, leg swelling, constipation, incontinence of bladder and itching   ALLERGIES: Allergies  Allergen  Reactions  . Penicillins Other (See Comments)    Has patient had a PCN reaction causing immediate rash, facial/tongue/throat swelling, SOB or lightheadedness with hypotension: Unk Has patient had a PCN reaction causing severe rash involving mucus membranes or skin necrosis: Unk Has patient had a PCN reaction that required hospitalization: Unk Has patient had a PCN reaction occurring within the last 10 years: Unk If all of the above answers are "NO", then may proceed with Cephalosporin use.   Marland Kitchen Hydroxyzine Rash  . Percocet [Oxycodone-Acetaminophen] Rash    HOME MEDICATIONS: Outpatient Medications Prior to Visit  Medication Sig Dispense Refill  . ACCU-CHEK AVIVA PLUS test strip     . acetaminophen (TYLENOL) 500 MG tablet Take 500 mg by mouth 2 (two) times daily. Reported on 06/19/2015    . amLODipine (NORVASC) 10 MG tablet Take 10 mg by mouth every evening.     . B-D UF III MINI PEN NEEDLES 31G X 5 MM MISC   3  . calamine lotion Apply 1 application topically 2 (two) times daily as needed for itching (to shoulders/ back).    . cholecalciferol (VITAMIN D) 1000 UNITS tablet Take 2,000 Units by mouth daily.     Marland Kitchen docusate sodium (COLACE) 100 MG capsule Take 100 mg by mouth 2 (two) times daily.    . fluticasone (FLONASE) 50 MCG/ACT nasal spray Place 1 spray into both nostrils 2 (two) times daily. (Patient taking differently: Place 1 spray into both nostrils 2 (two) times daily as needed for allergies. )  2  . guaiFENesin (MUCINEX) 600  MG 12 hr tablet Take 2 tablets (1,200 mg total) by mouth 2 (two) times daily. 10 tablet 0  . hydrALAZINE (APRESOLINE) 25 MG tablet Take 75 mg by mouth 3 (three) times daily.   1  . hydroxypropyl methylcellulose / hypromellose (ISOPTO TEARS / GONIOVISC) 2.5 % ophthalmic solution Place 1 drop into both eyes 4 (four) times daily.    . Insulin Glargine (LANTUS SOLOSTAR) 100 UNIT/ML Solostar Pen Inject 22 Units into the skin daily after breakfast.     . insulin lispro  (HUMALOG) 100 UNIT/ML injection Inject 0-5 Units into the skin 3 (three) times daily as needed for high blood sugar (only if BGL is 150 or greater). Sliding scale     . latanoprost (XALATAN) 0.005 % ophthalmic solution Place 1 drop into both eyes at bedtime.    . levETIRAcetam (KEPPRA) 250 MG tablet Take 1 tablet (250 mg total) by mouth 2 (two) times daily. 180 tablet 4  . loratadine (CLARITIN) 10 MG tablet Take 10 mg by mouth daily with lunch.    . Menthol-Zinc Oxide (CALMOSEPTINE EX) Apply 1 application topically 2 (two) times daily.    . pantoprazole (PROTONIX) 40 MG tablet Take 40 mg by mouth daily.     . pindolol (VISKEN) 5 MG tablet Take 1 tablet (5 mg total) by mouth 2 (two) times daily. 60 tablet 0  . polyethylene glycol powder (GLYCOLAX/MIRALAX) powder Take 17 g by mouth daily as needed for mild constipation.   12  . Probiotic Product (PROBIOTIC-10 PO) Take 1 capsule by mouth daily.    Marland Kitchen senna (SENOKOT) 8.6 MG TABS tablet Take 1 tablet by mouth.    . torsemide (DEMADEX) 10 MG tablet Take 10 mg by mouth daily.  1   No facility-administered medications prior to visit.     PAST MEDICAL HISTORY: Past Medical History:  Diagnosis Date  . Adult failure to thrive   . Altered mental status   . ANEMIA-NOS 02/17/2007  . Anxiety   . BACK PAIN, LUMBAR   . BENIGN PROSTATIC HYPERTROPHY   . Bilateral leg edema   . CAD (coronary artery disease)    a. nonobstructive CAD by cath in 2013  . CAP (community acquired pneumonia)   . CKD (chronic kidney disease)   . DEPRESSION 12/25/2008  . Diabetes mellitus   . DIABETES MELLITUS, TYPE I, CONTROLLED, WITH RETINOPATHY   . Encephalopathy, metabolic   . Gastroparesis   . GERD 02/17/2007  . Hyperlipidemia   . HYPERTENSION 02/17/2007  . Hyponatremia   . LIVER DISORDER   . OSTEOARTHRITIS 03/04/2010  . Pneumonia   . PSA, INCREASED   . Stroke ([place])   . UTI (urinary tract infection) 07/2017  . VITAMIN B12 DEFICIENCY     PAST SURGICAL  HISTORY: Past Surgical History:  Procedure Laterality Date  . LEFT HEART CATHETERIZATION WITH CORONARY ANGIOGRAM N/A 04/23/2011   Procedure: LEFT HEART CATHETERIZATION WITH CORONARY ANGIOGRAM;  Surgeon: Peter M Martinique, MD;  Location: [facility] CATH LAB;  Service: Cardiovascular;  Laterality: N/A;  . TRANSURETHRAL RESECTION OF PROSTATE      FAMILY HISTORY: Family History  Problem Relation Age of Onset  . Cancer Father        had uncertain type of cancer  . Cancer Brother        Prostate Cancer  . Cancer Brother        Prostate Cancer  . Diabetes Other     SOCIAL HISTORY: Social History   Socioeconomic History  .  Marital status: Widowed    Spouse name: Not on file  . Number of children: Not on file  . Years of education: Not on file  . Highest education level: Not on file  Occupational History  . 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 Financial resource strain: Not on file  . Food insecurity:    Worry: Not on file    Inability: Not on file  . Transportation needs:    Medical: Not on file    Non-medical: Not on file  Tobacco Use  . Smoking status: Never Smoker  . Smokeless tobacco: Never Used  Substance and Sexual Activity  . Alcohol use: No  . Drug use: No  . Sexual activity: Never  Lifestyle  . Physical activity:    Days per week: Not on file    Minutes per session: Not on file  . Stress: Not on file  Relationships  . Social connections:    Talks on phone: Not on file    Gets together: Not on file    Attends religious service: Not on file    Active member of club or organization: Not on file    Attends meetings of clubs or organizations: Not on file    Relationship status: Not on file  . Intimate partner violence:    Fear of current or ex partner: Not on file    Emotionally abused: Not on file    Physically abused: Not on file    Forced sexual activity: Not on file  Other Topics Concern  . Not on file  Social History  Narrative   Works Armed forces operational officer.   Widowed 2010.   Regular exercise-no     PHYSICAL EXAM  Vitals:   06/22/16 0854  BP: (!) 140/80  Pulse: 79  Weight: 185 lb (83.9 kg)   Body mass index is 26.58 kg/m.  Generalized: frail, elderly male in no acute distress  Head: normocephalic and atraumatic,.   Neck: Supple, no carotid bruits  Cardiac: Regular rate rhythm, no murmur  Musculoskeletal: Severe kyphosis Skin bilateral pedal edema  Neurological examination   Mentation:  MMSE below.   Attention span and concentration intact.  Recent and remote memory diminished.  Follows all commands speech and language fluent.  Cooperative and pleasant throughout exam.  MMSE - Mini Mental State Exam 05/10/2018 11/07/2017 06/22/2016  Orientation to time 2 0 1  Orientation to Place 3 1 4   Registration 3 3 3   Attention/ Calculation 0 1 0  Recall 2 0 0  Language- name 2 objects 2 2 2   Language- repeat 1 1 1   Language- follow 3 step command 3 3 3   Language- read & follow direction 1 1 1   Write a sentence 1 1 0  Copy design 0 0 1  Total score 18 13 16    Cranial nerve II-XII: Pupils were equal round reactive to light extraocular movements were full, visual field were full on confrontational test. Facial sensation and strength were normal. hearing was intact to finger rubbing bilaterally. Uvula tongue midline. head turning and shoulder shrug were normal and symmetric.Tongue protrusion into cheek strength was normal. Motor: normal bulk and tone, full strength in the BUE, BLE, fine finger movements normal, no pronator drift.  Sensory: normal and symmetric to light touch, pinprick, and  Vibration, in the upper and lower extremities Coordination: finger-nose-finger, heel-to-shin bilaterally, no dysmetria, no tremor Reflexes: 1+ upper lower and symmetric, plantar responses were flexor  bilaterally. Gait and Station: Patient ambulating with rolling walker with hunched stance    ASSESSMENT AND PLAN  7  year African-American male with left hemispheric TIA in July 2016 with vascular risk factors of hypertension, hyperlipidemia, age and sex.  Further history includes chronic DVT previously on warfarin, seizure activity and vascular dementia.  Patient is being seen today for follow-up visit and overall has been doing well with improvement of his memory and overall functioning.   -Continue Keppra 250 mg twice daily for secondary stroke prevention -Recommended initiation of aspirin 81 mg for secondary stroke prevention but sister prefers to follow-up with PCP regarding this recommendation -F/u with PCP regarding your HLD, DM and HTN management -possible need of statin for HLD and secondary stroke prevention depending on lipid panel (unable to see results from PCP office) -Continue to stay active and maintain a healthy diet -continue to monitor BP at home -Maintain strict control of hypertension with blood pressure goal below 130/90, diabetes with hemoglobin A1c goal below 6.5% and cholesterol with LDL cholesterol (bad cholesterol) goal below 70 mg/dL. I also advised the patient to eat a healthy diet with plenty of whole grains, cereals, fruits and vegetables, exercise regularly and maintain ideal body weight.  Follow up in 6 months or call earlier if needed  Greater than 50% of time during this 25 minute visit was spent on counseling,explanation of diagnosis of history of vascular dementia, TIA and seizures, reviewing risk factor management of HLD and HTN, planning of further management, discussion with patient and family and coordination of care  Venancio Poisson, AGNP-BC  [facility] [address] [place], [place] [address]  Phone [phone] Fax [phone] Note: This document was prepared with digital dictation and possible smart phrase technology. Any transcriptional errors that result from this process are unintentional.

## 2018-05-16 NOTE — Progress Notes (Signed)
I agree with the above plan 

## 2018-05-27 ENCOUNTER — Other Ambulatory Visit: Payer: Self-pay | Admitting: Cardiology

## 2018-11-08 ENCOUNTER — Ambulatory Visit: Payer: Medicare Other | Admitting: Adult Health

## 2018-11-08 ENCOUNTER — Encounter: Payer: Self-pay | Admitting: Adult Health

## 2018-11-08 ENCOUNTER — Other Ambulatory Visit: Payer: Self-pay

## 2018-11-08 VITALS — BP 130/72 | HR 82 | Temp 98.6°F | Ht 69.0 in

## 2018-11-08 DIAGNOSIS — Z794 Long term (current) use of insulin: Secondary | ICD-10-CM

## 2018-11-08 DIAGNOSIS — N183 Chronic kidney disease, stage 3 (moderate): Secondary | ICD-10-CM

## 2018-11-08 DIAGNOSIS — E1122 Type 2 diabetes mellitus with diabetic chronic kidney disease: Secondary | ICD-10-CM

## 2018-11-08 DIAGNOSIS — G459 Transient cerebral ischemic attack, unspecified: Secondary | ICD-10-CM

## 2018-11-08 DIAGNOSIS — G40909 Epilepsy, unspecified, not intractable, without status epilepticus: Secondary | ICD-10-CM | POA: Diagnosis not present

## 2018-11-08 DIAGNOSIS — I10 Essential (primary) hypertension: Secondary | ICD-10-CM | POA: Diagnosis not present

## 2018-11-08 DIAGNOSIS — E785 Hyperlipidemia, unspecified: Secondary | ICD-10-CM

## 2018-11-08 NOTE — Progress Notes (Signed)
[facility]  PATIENT: Eric [name] DOB: 07-30-1923   REASON FOR VISIT: follow up for TIA  , memory loss HISTORY FROM: patient and caregiver  Chief Complaint  Patient presents with   Seizures    rm 2, 6 month FU, sis- Vicente Males, sonAwanda Mink     HISTORY OF PRESENT ILLNESS:  Eric [name] is a 85 year old African-American male with PMH of HTN, DM, BPH, HLD, CKD, DVT previously on Coumadin, CHF, TIA (2016), seizures and dementia.  At prior visits, patient had reported episodes of intermittent episodes of dozing off and briefly unresponsive but did not know any postictal time after.  He was started on Dilantin at this time.  EEG on 09/20/2017 negative for seizure activity.  Patient was admitted to ED on 09/29/2017 for syncopal episode with second-degree AV block and transient bradycardic episodes.  Dilantin was changed to Keppra due to possible cause of significant bradycardia.   05/10/2018 visit: Patient is being seen today for 50-month follow-up visit.  MMSE 18/30 with prior 13/30. Sister states he has been doing well with improvement since the summer.  He is using a RW for ambulation without reported falls. Continues on Keppra without reported side effects or recurrent seizure activity. Sister states he is more awake during the day and sleeping well at night. He continues to live independently with aide assistance majority of the day. Blood pressure today 175/85 and typically lower at home as reported by sister. This is monitored daily at home.  He does continue to follow with his PCP routinely every 3 months.  No further concerns at this time.  Denies new or worsening stroke/TIA symptoms.   11/08/2018 visit: Eric [name] is a 85 year old male who is being seen today for follow-up regarding seizures accompanied by his sister and son.  He has continued on Keppra which he is tolerating well without reported side effects or recurrent seizure activity.  Continues to live independently  with live in aide assistance. Memory has been stable. Blood pressure 130/72.  No concerns at this time.    REVIEW OF SYSTEMS: Full 14 system review of systems performed and notable only for those listed, all others are neg: Memory loss   ALLERGIES: Allergies  Allergen Reactions   Penicillins Other (See Comments)    Has patient had a PCN reaction causing immediate rash, facial/tongue/throat swelling, SOB or lightheadedness with hypotension: Unk Has patient had a PCN reaction causing severe rash involving mucus membranes or skin necrosis: Unk Has patient had a PCN reaction that required hospitalization: Unk Has patient had a PCN reaction occurring within the last 10 years: Unk If all of the above answers are "NO", then may proceed with Cephalosporin use.    Hydroxyzine Rash   Percocet [Oxycodone-Acetaminophen] Rash    HOME MEDICATIONS: Outpatient Medications Prior to Visit  Medication Sig Dispense Refill   acetaminophen (TYLENOL) 500 MG tablet Take 500 mg by mouth 2 (two) times daily. Reported on 06/19/2015     amLODipine (NORVASC) 10 MG tablet Take 10 mg by mouth every evening.      calamine lotion Apply 1 application topically 2 (two) times daily as needed for itching (to shoulders/ back).     cholecalciferol (VITAMIN D) 1000 UNITS tablet Take 2,000 Units by mouth daily.      docusate sodium (COLACE) 100 MG capsule Take 100 mg by mouth 2 (two) times daily.     fluticasone (FLONASE) 50 MCG/ACT nasal spray Place 1 spray into both nostrils 2 (two) times  daily. (Patient taking differently: Place 1 spray into both nostrils 2 (two) times daily as needed for allergies. )  2   hydrALAZINE (APRESOLINE) 25 MG tablet TAKE 3 TABLETS BY MOUTH 3  TIMES DAILY 810 tablet 1   KLOR-CON M20 20 MEQ tablet 10 mEq daily.     latanoprost (XALATAN) 0.005 % ophthalmic solution Place 1 drop into both eyes at bedtime.     levETIRAcetam (KEPPRA) 250 MG tablet Take 1 tablet (250 mg total) by mouth 2  (two) times daily. 180 tablet 4   loratadine (CLARITIN) 10 MG tablet Take 10 mg by mouth daily with lunch.     pantoprazole (PROTONIX) 40 MG tablet Take 40 mg by mouth daily.      pindolol (VISKEN) 5 MG tablet Take 1 tablet (5 mg total) by mouth 2 (two) times daily. 60 tablet 0   polyethylene glycol powder (GLYCOLAX/MIRALAX) powder Take 17 g by mouth daily as needed for mild constipation.   12   Probiotic Product (PROBIOTIC-10 PO) Take 1 capsule by mouth daily.     torsemide (DEMADEX) 10 MG tablet Take 10 mg by mouth daily.  1   ACCU-CHEK AVIVA PLUS test strip      B-D UF III MINI PEN NEEDLES 31G X 5 MM MISC   3   guaiFENesin (MUCINEX) 600 MG 12 hr tablet Take 2 tablets (1,200 mg total) by mouth 2 (two) times daily. (Patient not taking: Reported on 11/08/2018) 10 tablet 0   hydroxypropyl methylcellulose / hypromellose (ISOPTO TEARS / GONIOVISC) 2.5 % ophthalmic solution Place 1 drop into both eyes 4 (four) times daily.     Insulin Glargine (LANTUS SOLOSTAR) 100 UNIT/ML Solostar Pen Inject 22 Units into the skin daily after breakfast.      insulin lispro (HUMALOG) 100 UNIT/ML injection Inject 0-5 Units into the skin 3 (three) times daily as needed for high blood sugar (only if BGL is 150 or greater). Sliding scale      Menthol-Zinc Oxide (CALMOSEPTINE EX) Apply 1 application topically 2 (two) times daily.     senna (SENOKOT) 8.6 MG TABS tablet Take 1 tablet by mouth.     No facility-administered medications prior to visit.     PAST MEDICAL HISTORY: Past Medical History:  Diagnosis Date   Adult failure to thrive    Altered mental status    ANEMIA-NOS 02/17/2007   Anxiety    BACK PAIN, LUMBAR    BENIGN PROSTATIC HYPERTROPHY    Bilateral leg edema    CAD (coronary artery disease)    a. nonobstructive CAD by cath in 2013   CAP (community acquired pneumonia)    CKD (chronic kidney disease)    DEPRESSION 12/25/2008   Diabetes mellitus    DIABETES MELLITUS, TYPE I,  CONTROLLED, WITH RETINOPATHY    Encephalopathy, metabolic    Gastroparesis    GERD 02/17/2007   Hyperlipidemia    HYPERTENSION 02/17/2007   Hyponatremia    LIVER DISORDER    OSTEOARTHRITIS 03/04/2010   Pneumonia    PSA, INCREASED    Stroke ([place])    UTI (urinary tract infection) 07/2017   VITAMIN B12 DEFICIENCY     PAST SURGICAL HISTORY: Past Surgical History:  Procedure Laterality Date   LEFT HEART CATHETERIZATION WITH CORONARY ANGIOGRAM N/A 04/23/2011   Procedure: LEFT HEART CATHETERIZATION WITH CORONARY ANGIOGRAM;  Surgeon: Peter M Martinique, MD;  Location: [facility] CATH LAB;  Service: Cardiovascular;  Laterality: N/A;   TRANSURETHRAL RESECTION OF PROSTATE  FAMILY HISTORY: Family History  Problem Relation Age of Onset   Cancer Father        had uncertain type of cancer   Cancer Brother        Prostate Cancer   Cancer Brother        Prostate Cancer   Diabetes Other     SOCIAL HISTORY: Social History   Socioeconomic History   Marital status: Widowed    Spouse name: Not on file   Number of children: Not on file   Years of education: Not on file   Highest education level: Not on file  Occupational History   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 strain: Not on file   Food insecurity    Worry: Not on file    Inability: Not on file   Transportation needs    Medical: Not on file    Non-medical: Not on file  Tobacco Use   Smoking status: Never Smoker   Smokeless tobacco: Never Used  Substance and Sexual Activity   Alcohol use: No   Drug use: No   Sexual activity: Never  Lifestyle   Physical activity    Days per week: Not on file    Minutes per session: Not on file   Stress: Not on file  Relationships   Social connections    Talks on phone: Not on file    Gets together: Not on file    Attends religious service: Not on file    Active member of club or organization:  Not on file    Attends meetings of clubs or organizations: Not on file    Relationship status: Not on file   Intimate partner violence    Fear of current or ex partner: Not on file    Emotionally abused: Not on file    Physically abused: Not on file    Forced sexual activity: Not on file  Other Topics Concern   Not on file  Social History Narrative   Works Education administrator business.   Widowed 2010.   Regular exercise-no     PHYSICAL EXAM  Vitals:   06/22/16 0854  BP: (!) 140/80  Pulse: 79  Weight: 185 lb (83.9 kg)   Body mass index is 26.58 kg/m.  Generalized: frail, pleasant elderly African-American male in no acute distress  Head: normocephalic and atraumatic,.   Neck: Supple, no carotid bruits  Cardiac: Regular rate rhythm, no murmur  Musculoskeletal: Severe kyphosis Skin bilateral pedal edema  Neurological examination   Mentation:  Attention span and concentration intact.  Recent and remote memory diminished.  Follows all commands speech and language fluent.  Cooperative and pleasant throughout exam. Cranial nerve II-XII: Pupils were equal round reactive to light extraocular movements were full, visual field were full on confrontational test. Facial sensation and strength were normal. hearing was intact to finger rubbing bilaterally. Uvula tongue midline. head turning and shoulder shrug were normal and symmetric.Tongue protrusion into cheek strength was normal. Motor: normal bulk and tone, full strength in the BUE, BLE, fine finger movements normal, no pronator drift.  Sensory: normal and symmetric to light touch, pinprick, and  Vibration, in the upper and lower extremities Coordination: finger-nose-finger, heel-to-shin bilaterally, no dysmetria, no tremor Reflexes: 1+ upper lower and symmetric, plantar responses were flexor bilaterally. Gait and Station: Current use of wheelchair at today's visit without rolling walker present therefore gait assessment  deferred    ASSESSMENT AND  PLAN  63 year African-American male with left hemispheric TIA in July 2016 along with seizure activity in 10/06/2017.  Vascular risk factors of hypertension, hyperlipidemia, age and sex.  Further history includes chronic DVT previously on warfarin, seizure activity and vascular dementia.  He has been stable since prior visit without recurrent seizure activity with continued use of Keppra.   -Continue Keppra 250 mg twice daily for seizure prophylaxis -No current use of antithrombotics or statins which was discussed at prior visit for secondary stroke prevention.  Sister prefers to follow-up with PCP in regards to ongoing secondary stroke prevention management -F/u with PCP regarding your HLD, DM and HTN management -Continue to stay active and maintain a healthy diet -continue to monitor BP at home -Maintain strict control of hypertension with blood pressure goal below 130/90, diabetes with hemoglobin A1c goal below 6.5% and cholesterol with LDL cholesterol (bad cholesterol) goal below 70 mg/dL. I also advised the patient to eat a healthy diet with plenty of whole grains, cereals, fruits and vegetables, exercise regularly and maintain ideal body weight.  Follow up in 6 months or call earlier if needed  Greater than 50% of time during this 25 minute visit was spent on counseling,explanation of diagnosis of history of vascular dementia, TIA and seizures, reviewing risk factor management of HLD and HTN, planning of further management, discussion with patient and family and coordination of care  Eric [name], AGNP-BC  [facility] [address] [place], [place] [address]  Phone [phone] Fax [phone] Note: This document was prepared with digital dictation and possible smart phrase technology. Any transcriptional errors that result from this process are unintentional.

## 2018-11-08 NOTE — Patient Instructions (Addendum)
Your Plan:  Continue ongoing use of Keppra 250 mg twice daily  Continue to follow up with your PCP for secondary stroke prevention   Follow up in 6 months or call earlier if needed     Thank you for coming to see [place] at [facility]. I hope we have been able to provide you high quality care today.  You may receive a patient satisfaction survey over the next few weeks. We would appreciate your feedback and comments so that we may continue to improve ourselves and the health of our patients.

## 2018-11-09 ENCOUNTER — Encounter: Payer: Self-pay | Admitting: Adult Health

## 2018-11-09 NOTE — Progress Notes (Signed)
I agree with the above plan 

## 2018-12-27 ENCOUNTER — Other Ambulatory Visit: Payer: Self-pay | Admitting: Cardiology

## 2019-02-18 ENCOUNTER — Encounter (HOSPITAL_COMMUNITY): Payer: Self-pay | Admitting: Emergency Medicine

## 2019-02-18 ENCOUNTER — Inpatient Hospital Stay (HOSPITAL_COMMUNITY)
Admission: EM | Admit: 2019-02-18 | Discharge: 2019-02-22 | DRG: 291 | Disposition: A | Discharge status: Home Health | Payer: Medicare Other | Attending: Family Medicine | Admitting: Family Medicine

## 2019-02-18 ENCOUNTER — Emergency Department (HOSPITAL_COMMUNITY): Payer: Medicare Other

## 2019-02-18 ENCOUNTER — Other Ambulatory Visit: Payer: Self-pay

## 2019-02-18 DIAGNOSIS — Z885 Allergy status to narcotic agent status: Secondary | ICD-10-CM

## 2019-02-18 DIAGNOSIS — R778 Other specified abnormalities of plasma proteins: Secondary | ICD-10-CM | POA: Diagnosis not present

## 2019-02-18 DIAGNOSIS — J9621 Acute and chronic respiratory failure with hypoxia: Secondary | ICD-10-CM | POA: Diagnosis present

## 2019-02-18 DIAGNOSIS — E872 Acidosis, unspecified: Secondary | ICD-10-CM | POA: Diagnosis present

## 2019-02-18 DIAGNOSIS — Z880 Allergy status to penicillin: Secondary | ICD-10-CM

## 2019-02-18 DIAGNOSIS — J811 Chronic pulmonary edema: Secondary | ICD-10-CM

## 2019-02-18 DIAGNOSIS — D539 Nutritional anemia, unspecified: Secondary | ICD-10-CM | POA: Diagnosis present

## 2019-02-18 DIAGNOSIS — Z79899 Other long term (current) drug therapy: Secondary | ICD-10-CM

## 2019-02-18 DIAGNOSIS — E785 Hyperlipidemia, unspecified: Secondary | ICD-10-CM | POA: Diagnosis present

## 2019-02-18 DIAGNOSIS — Z8042 Family history of malignant neoplasm of prostate: Secondary | ICD-10-CM | POA: Diagnosis not present

## 2019-02-18 DIAGNOSIS — E1022 Type 1 diabetes mellitus with diabetic chronic kidney disease: Secondary | ICD-10-CM | POA: Diagnosis present

## 2019-02-18 DIAGNOSIS — E11649 Type 2 diabetes mellitus with hypoglycemia without coma: Secondary | ICD-10-CM | POA: Diagnosis present

## 2019-02-18 DIAGNOSIS — I5033 Acute on chronic diastolic (congestive) heart failure: Secondary | ICD-10-CM | POA: Diagnosis present

## 2019-02-18 DIAGNOSIS — Z794 Long term (current) use of insulin: Secondary | ICD-10-CM

## 2019-02-18 DIAGNOSIS — J449 Chronic obstructive pulmonary disease, unspecified: Secondary | ICD-10-CM | POA: Diagnosis present

## 2019-02-18 DIAGNOSIS — N400 Enlarged prostate without lower urinary tract symptoms: Secondary | ICD-10-CM | POA: Diagnosis present

## 2019-02-18 DIAGNOSIS — J9601 Acute respiratory failure with hypoxia: Secondary | ICD-10-CM

## 2019-02-18 DIAGNOSIS — J810 Acute pulmonary edema: Secondary | ICD-10-CM | POA: Diagnosis not present

## 2019-02-18 DIAGNOSIS — Z888 Allergy status to other drugs, medicaments and biological substances status: Secondary | ICD-10-CM | POA: Diagnosis not present

## 2019-02-18 DIAGNOSIS — I130 Hypertensive heart and chronic kidney disease with heart failure and stage 1 through stage 4 chronic kidney disease, or unspecified chronic kidney disease: Principal | ICD-10-CM | POA: Diagnosis present

## 2019-02-18 DIAGNOSIS — I2510 Atherosclerotic heart disease of native coronary artery without angina pectoris: Secondary | ICD-10-CM | POA: Diagnosis present

## 2019-02-18 DIAGNOSIS — I248 Other forms of acute ischemic heart disease: Secondary | ICD-10-CM | POA: Diagnosis present

## 2019-02-18 DIAGNOSIS — N184 Chronic kidney disease, stage 4 (severe): Secondary | ICD-10-CM | POA: Diagnosis present

## 2019-02-18 DIAGNOSIS — Z8673 Personal history of transient ischemic attack (TIA), and cerebral infarction without residual deficits: Secondary | ICD-10-CM

## 2019-02-18 DIAGNOSIS — I340 Nonrheumatic mitral (valve) insufficiency: Secondary | ICD-10-CM | POA: Diagnosis not present

## 2019-02-18 DIAGNOSIS — G40909 Epilepsy, unspecified, not intractable, without status epilepticus: Secondary | ICD-10-CM | POA: Diagnosis present

## 2019-02-18 DIAGNOSIS — K219 Gastro-esophageal reflux disease without esophagitis: Secondary | ICD-10-CM | POA: Diagnosis present

## 2019-02-18 DIAGNOSIS — I361 Nonrheumatic tricuspid (valve) insufficiency: Secondary | ICD-10-CM | POA: Diagnosis not present

## 2019-02-18 DIAGNOSIS — Z833 Family history of diabetes mellitus: Secondary | ICD-10-CM | POA: Diagnosis not present

## 2019-02-18 DIAGNOSIS — N179 Acute kidney failure, unspecified: Secondary | ICD-10-CM | POA: Diagnosis present

## 2019-02-18 DIAGNOSIS — Z20828 Contact with and (suspected) exposure to other viral communicable diseases: Secondary | ICD-10-CM | POA: Diagnosis present

## 2019-02-18 DIAGNOSIS — I509 Heart failure, unspecified: Secondary | ICD-10-CM

## 2019-02-18 DIAGNOSIS — N189 Chronic kidney disease, unspecified: Secondary | ICD-10-CM | POA: Diagnosis not present

## 2019-02-18 LAB — COMPREHENSIVE METABOLIC PANEL
ALT: 15 U/L (ref 0–44)
AST: 22 U/L (ref 15–41)
Albumin: 3.1 g/dL — ABNORMAL LOW (ref 3.5–5.0)
Alkaline Phosphatase: 69 U/L (ref 38–126)
Anion gap: 13 (ref 5–15)
BUN: 44 mg/dL — ABNORMAL HIGH (ref 8–23)
CO2: 18 mmol/L — ABNORMAL LOW (ref 22–32)
Calcium: 9.3 mg/dL (ref 8.9–10.3)
Chloride: 108 mmol/L (ref 98–111)
Creatinine, Ser: 3.49 mg/dL — ABNORMAL HIGH (ref 0.61–1.24)
GFR calc Af Amer: 17 mL/min — ABNORMAL LOW (ref >60)
GFR calc non Af Amer: 14 mL/min — ABNORMAL LOW (ref >60)
Glucose, Bld: 115 mg/dL — ABNORMAL HIGH (ref 70–99)
Potassium: 4.2 mmol/L (ref 3.5–5.1)
Sodium: 139 mmol/L (ref 135–145)
Total Bilirubin: 0.6 mg/dL (ref 0.3–1.2)
Total Protein: 7.6 g/dL (ref 6.5–8.1)

## 2019-02-18 LAB — CBC WITH DIFFERENTIAL/PLATELET
Abs Immature Granulocytes: 0.07 10*3/uL (ref 0.00–0.07)
Basophils Absolute: 0 10*3/uL (ref 0.0–0.1)
Basophils Relative: 0 %
Eosinophils Absolute: 0.1 10*3/uL (ref 0.0–0.5)
Eosinophils Relative: 1 %
HCT: 35.2 % — ABNORMAL LOW (ref 39.0–52.0)
Hemoglobin: 11.6 g/dL — ABNORMAL LOW (ref 13.0–17.0)
Immature Granulocytes: 1 %
Lymphocytes Relative: 14 %
Lymphs Abs: 1.2 10*3/uL (ref 0.7–4.0)
MCH: 33 pg (ref 26.0–34.0)
MCHC: 33 g/dL (ref 30.0–36.0)
MCV: 100.3 fL — ABNORMAL HIGH (ref 80.0–100.0)
Monocytes Absolute: 0.2 10*3/uL (ref 0.1–1.0)
Monocytes Relative: 2 %
Neutro Abs: 7.1 10*3/uL (ref 1.7–7.7)
Neutrophils Relative %: 82 %
Platelets: 217 10*3/uL (ref 150–400)
RBC: 3.51 MIL/uL — ABNORMAL LOW (ref 4.22–5.81)
RDW: 12.8 % (ref 11.5–15.5)
WBC: 8.6 10*3/uL (ref 4.0–10.5)
nRBC: 0 % (ref 0.0–0.2)

## 2019-02-18 LAB — I-STAT CHEM 8, ED
BUN: 41 mg/dL — ABNORMAL HIGH (ref 8–23)
Calcium, Ion: 1.16 mmol/L (ref 1.15–1.40)
Chloride: 109 mmol/L (ref 98–111)
Creatinine, Ser: 3.4 mg/dL — ABNORMAL HIGH (ref 0.61–1.24)
Glucose, Bld: 112 mg/dL — ABNORMAL HIGH (ref 70–99)
HCT: 35 % — ABNORMAL LOW (ref 39.0–52.0)
Hemoglobin: 11.9 g/dL — ABNORMAL LOW (ref 13.0–17.0)
Potassium: 4.2 mmol/L (ref 3.5–5.1)
Sodium: 141 mmol/L (ref 135–145)
TCO2: 20 mmol/L — ABNORMAL LOW (ref 22–32)

## 2019-02-18 LAB — BRAIN NATRIURETIC PEPTIDE: B Natriuretic Peptide: 825.7 pg/mL — ABNORMAL HIGH (ref 0.0–100.0)

## 2019-02-18 LAB — GLUCOSE, CAPILLARY
Glucose-Capillary: 167 mg/dL — ABNORMAL HIGH (ref 70–99)
Glucose-Capillary: 161 mg/dL — ABNORMAL HIGH (ref 70–99)

## 2019-02-18 LAB — TROPONIN I (HIGH SENSITIVITY)
Troponin I (High Sensitivity): 79 ng/L — ABNORMAL HIGH (ref <18)
Troponin I (High Sensitivity): 269 ng/L (ref <18)

## 2019-02-18 LAB — SARS CORONAVIRUS 2 (TAT 6-24 HRS): SARS Coronavirus 2: NEGATIVE

## 2019-02-18 MED ORDER — LORATADINE 10 MG PO TABS
10.0000 mg | ORAL_TABLET | Freq: Every day | ORAL | Status: DC
  Administered 2019-02-18 – 2019-02-21 (×4): 10 mg via ORAL
  Filled 2019-02-18 (×5): qty 1

## 2019-02-18 MED ORDER — HEPARIN SODIUM (PORCINE) 5000 UNIT/ML IJ SOLN
5000.0000 [IU] | Freq: Three times a day (TID) | INTRAMUSCULAR | Status: DC
  Administered 2019-02-18 – 2019-02-22 (×12): 5000 [IU] via SUBCUTANEOUS
  Filled 2019-02-18 (×12): qty 1

## 2019-02-18 MED ORDER — SODIUM CHLORIDE 0.9% FLUSH
3.0000 mL | Freq: Two times a day (BID) | INTRAVENOUS | Status: DC
  Administered 2019-02-18 – 2019-02-21 (×7): 3 mL via INTRAVENOUS

## 2019-02-18 MED ORDER — SENNA 8.6 MG PO TABS: 1.0000 | ORAL_TABLET | ORAL | Status: DC

## 2019-02-18 MED ORDER — LEVETIRACETAM 250 MG PO TABS
250.0000 mg | ORAL_TABLET | Freq: Two times a day (BID) | ORAL | Status: DC
  Administered 2019-02-18 – 2019-02-22 (×9): 250 mg via ORAL
  Filled 2019-02-18 (×10): qty 1

## 2019-02-18 MED ORDER — SENNA 8.6 MG PO TABS
1.0000 | ORAL_TABLET | ORAL | Status: DC
  Administered 2019-02-20: 8.6 mg via ORAL
  Filled 2019-02-18: qty 1

## 2019-02-18 MED ORDER — PANTOPRAZOLE SODIUM 40 MG PO TBEC
40.0000 mg | DELAYED_RELEASE_TABLET | Freq: Every day | ORAL | Status: DC
  Administered 2019-02-18 – 2019-02-22 (×5): 40 mg via ORAL
  Filled 2019-02-18 (×5): qty 1

## 2019-02-18 MED ORDER — SODIUM CHLORIDE 0.9 % IV SOLN: 250.0000 mL | INTRAVENOUS | Status: DC | PRN

## 2019-02-18 MED ORDER — ALBUTEROL SULFATE HFA 108 (90 BASE) MCG/ACT IN AERS
4.0000 | INHALATION_SPRAY | Freq: Once | RESPIRATORY_TRACT | Status: AC
  Administered 2019-02-18: 10:00:00 4 via RESPIRATORY_TRACT
  Filled 2019-02-18: qty 6.7

## 2019-02-18 MED ORDER — HYDRALAZINE HCL 50 MG PO TABS
75.0000 mg | ORAL_TABLET | Freq: Three times a day (TID) | ORAL | Status: DC
  Administered 2019-02-18 – 2019-02-22 (×12): 75 mg via ORAL
  Filled 2019-02-18 (×12): qty 1

## 2019-02-18 MED ORDER — FUROSEMIDE 10 MG/ML IJ SOLN
20.0000 mg | Freq: Once | INTRAMUSCULAR | Status: AC
  Administered 2019-02-18: 10:00:00 20 mg via INTRAVENOUS
  Filled 2019-02-18: qty 2

## 2019-02-18 MED ORDER — FUROSEMIDE 10 MG/ML IJ SOLN
20.0000 mg | Freq: Two times a day (BID) | INTRAMUSCULAR | Status: DC
  Administered 2019-02-18: 20 mg via INTRAVENOUS
  Filled 2019-02-18: qty 2

## 2019-02-18 MED ORDER — PINDOLOL 5 MG PO TABS
5.0000 mg | ORAL_TABLET | Freq: Two times a day (BID) | ORAL | Status: DC
  Administered 2019-02-18 – 2019-02-22 (×9): 5 mg via ORAL
  Filled 2019-02-18 (×11): qty 1

## 2019-02-18 MED ORDER — ACETAMINOPHEN 325 MG PO TABS: 650.0000 mg | ORAL_TABLET | ORAL | Status: DC | PRN

## 2019-02-18 MED ORDER — INSULIN ASPART 100 UNIT/ML ~~LOC~~ SOLN
0.0000 [IU] | Freq: Three times a day (TID) | SUBCUTANEOUS | Status: DC
  Administered 2019-02-18: 2 [IU] via SUBCUTANEOUS
  Administered 2019-02-19: 3 [IU] via SUBCUTANEOUS
  Administered 2019-02-19: 1 [IU] via SUBCUTANEOUS
  Administered 2019-02-20 – 2019-02-22 (×8): 2 [IU] via SUBCUTANEOUS

## 2019-02-18 MED ORDER — ONDANSETRON HCL 4 MG/2ML IJ SOLN: 4.0000 mg | Freq: Four times a day (QID) | INTRAMUSCULAR | Status: DC | PRN

## 2019-02-18 MED ORDER — SODIUM BICARBONATE 650 MG PO TABS
650.0000 mg | ORAL_TABLET | Freq: Every day | ORAL | Status: DC
  Administered 2019-02-18 – 2019-02-21 (×4): 650 mg via ORAL
  Filled 2019-02-18 (×6): qty 1

## 2019-02-18 MED ORDER — SENNA 8.6 MG PO TABS
1.0000 | ORAL_TABLET | ORAL | Status: DC
  Administered 2019-02-22: 8.6 mg via ORAL
  Filled 2019-02-18: qty 1

## 2019-02-18 MED ORDER — POLYETHYLENE GLYCOL 3350 17 GM/SCOOP PO POWD
17.0000 g | Freq: Every day | ORAL | Status: DC | PRN
  Filled 2019-02-18: qty 255

## 2019-02-18 MED ORDER — AMLODIPINE BESYLATE 10 MG PO TABS
10.0000 mg | ORAL_TABLET | Freq: Every evening | ORAL | Status: DC
  Administered 2019-02-18 – 2019-02-21 (×4): 10 mg via ORAL
  Filled 2019-02-18 (×4): qty 1

## 2019-02-18 MED ORDER — POTASSIUM CHLORIDE CRYS ER 10 MEQ PO TBCR
10.0000 meq | EXTENDED_RELEASE_TABLET | Freq: Every day | ORAL | Status: DC
  Administered 2019-02-18 – 2019-02-22 (×5): 10 meq via ORAL
  Filled 2019-02-18 (×5): qty 1

## 2019-02-18 MED ORDER — LATANOPROST 0.005 % OP SOLN
1.0000 [drp] | Freq: Every day | OPHTHALMIC | Status: DC
  Administered 2019-02-19 – 2019-02-21 (×4): 1 [drp] via OPHTHALMIC
  Filled 2019-02-18 (×2): qty 2.5

## 2019-02-18 MED ORDER — FLUTICASONE PROPIONATE 50 MCG/ACT NA SUSP
1.0000 | Freq: Two times a day (BID) | NASAL | Status: DC | PRN
  Filled 2019-02-18: qty 16

## 2019-02-18 MED ORDER — SENNA 8.6 MG PO TABS
1.0000 | ORAL_TABLET | ORAL | Status: DC
  Administered 2019-02-18: 8.6 mg via ORAL
  Filled 2019-02-18: qty 1

## 2019-02-18 MED ORDER — SODIUM CHLORIDE 0.9% FLUSH
3.0000 mL | INTRAVENOUS | Status: DC | PRN
  Administered 2019-02-19: 3 mL via INTRAVENOUS
  Filled 2019-02-18: qty 3

## 2019-02-18 NOTE — H&P (Signed)
History and Physical    Eric [name] ZOX:[id number] DOB: 1923/12/14 DOA: 02/18/2019  Referring MD/NP/PA: Benedetto Goad, PA-C PCP: [name], MD  Patient coming from: Home via EMS  Chief Complaint: Shortness of breath  I have personally briefly reviewed patient's old medical records in [place]   HPI: Eric [name] is a 85 y.o. male with medical history significant of diastolic CHF, CKD stage IV, HTN, chronic anemia, seizure disorder, and diabetes mellitus type 2 who presented with complaints of shortness of breath.  History is obtained from the patient and his sister who is present at bedside.  He was reportedly doing okay up until this morning.  Sister notes that he was struggling to breathe.  Patient reports that he was up all night unable to sleep.  He complained of having pressure in his upper abdomen and chest.  His sister felt like he was going to vomit, but he never vomited.  Associated symptoms include some mild lower extremity edema and reports of weight gain.  Denies having any significant recent fevers, cough, chest pain, diarrhea, dysuria. In route with EMS patient was noted to have O2 saturations of 86% on room air.  He was placed on 4 L of nasal cannula oxygen with improvement of O2 saturations and given 1 nitroglycerin.  Patient was previously followed by Dr. Einar Gip, but states that care was turned over to his primary care doctor who has been managed him since.  Patient has some mild memory issues per family.  ED Course: On admission into the emergency department patient was noted to be tachypneic with O2 saturations as low as 86% on room air, and improved to 99% on 4 L nasal cannula oxygen.  Labs significant for hemoglobin 11.6, CO2 18, BUN 44, creatinine 3.44, high-sensitivity troponin 79, and BNP 825.  Chest x-ray showing cardiomegaly with concern for mild edema.  Patient was given albuterol and 20 mg of Lasix IV.  TRH called to admit for suspected congestive heart  failure exacerbation.   Review of systems: A complete 10 point review of systems was performed and negative except for as noted above in HPI.  Past Medical History:  Diagnosis Date  . Adult failure to thrive   . Altered mental status   . ANEMIA-NOS 02/17/2007  . Anxiety   . BACK PAIN, LUMBAR   . BENIGN PROSTATIC HYPERTROPHY   . Bilateral leg edema   . CAD (coronary artery disease)    a. nonobstructive CAD by cath in 2013  . CAP (community acquired pneumonia)   . CKD (chronic kidney disease)   . DEPRESSION 12/25/2008  . Diabetes mellitus   . DIABETES MELLITUS, TYPE I, CONTROLLED, WITH RETINOPATHY   . Encephalopathy, metabolic   . Gastroparesis   . GERD 02/17/2007  . Hyperlipidemia   . HYPERTENSION 02/17/2007  . Hyponatremia   . LIVER DISORDER   . OSTEOARTHRITIS 03/04/2010  . Pneumonia   . PSA, INCREASED   . Stroke ([place])   . UTI (urinary tract infection) 07/2017  . VITAMIN B12 DEFICIENCY     Past Surgical History:  Procedure Laterality Date  . LEFT HEART CATHETERIZATION WITH CORONARY ANGIOGRAM N/A 04/23/2011   Procedure: LEFT HEART CATHETERIZATION WITH CORONARY ANGIOGRAM;  Surgeon: Peter M Martinique, MD;  Location[facility] C[facility] LAB;  Service: Cardiovascular;  Laterality: N/A;  . TRANSURETHRAL RESECTION OF PROSTATE       reports that he has never smoked. He has never used smokeless tobacco. He reports that he does not  drink alcohol or use drugs.  Allergies  Allergen Reactions  . Penicillins Other (See Comments)    Has patient had a PCN reaction causing immediate rash, facial/tongue/throat swelling, SOB or lightheadedness with hypotension: Unk Has patient had a PCN reaction causing severe rash involving mucus membranes or skin necrosis: Unk Has patient had a PCN reaction that required hospitalization: Unk Has patient had a PCN reaction occurring within the last 10 years: Unk If all of the above answers are "NO", then may proceed with Cephalosporin use.   Marland Kitchen Hydroxyzine Rash  .  Percocet [Oxycodone-Acetaminophen] Rash    Family History  Problem Relation Age of Onset  . Cancer Father        had uncertain type of cancer  . Cancer Brother        Prostate Cancer  . Cancer Brother        Prostate Cancer  . Diabetes Other     Prior to Admission medications   Medication Sig Start Date End Date Taking? Authorizing Provider  ACCU-CHEK AVIVA PLUS test strip  05/01/14   [provider]  acetaminophen (TYLENOL) 500 MG tablet Take 500 mg by mouth 2 (two) times daily. Reported on 06/19/2015    [provider]  amLODipine (NORVASC) 10 MG tablet Take 10 mg by mouth every evening.  05/26/16   [provider]  B-D UF III MINI PEN NEEDLES 31G X 5 MM MISC  06/20/14   [provider]  calamine lotion Apply 1 application topically 2 (two) times daily as needed for itching (to shoulders/ back).    [provider]  cholecalciferol (VITAMIN D) 1000 UNITS tablet Take 2,000 Units by mouth daily.     [provider]  docusate sodium (COLACE) 100 MG capsule Take 100 mg by mouth 2 (two) times daily.    [provider]  fluticasone (FLONASE) 50 MCG/ACT nasal spray Place 1 spray into both nostrils 2 (two) times daily. Patient taking differently: Place 1 spray into both nostrils 2 (two) times daily as needed for allergies.  10/04/16   Doreatha Lew, MD  guaiFENesin (MUCINEX) 600 MG 12 hr tablet Take 2 tablets (1,200 mg total) by mouth 2 (two) times daily. Patient not taking: Reported on 11/08/2018 08/29/17   Shelly Coss, MD  hydrALAZINE (APRESOLINE) 25 MG tablet TAKE 3 TABLETS BY MOUTH 3  TIMES DAILY 12/28/18   Adrian Prows, MD  hydroxypropyl methylcellulose / hypromellose (ISOPTO TEARS / GONIOVISC) 2.5 % ophthalmic solution Place 1 drop into both eyes 4 (four) times daily.    [provider]  Insulin Glargine (LANTUS SOLOSTAR) 100 UNIT/ML Solostar Pen Inject 22 Units into the skin daily after breakfast.     [provider]  insulin lispro (HUMALOG) 100 UNIT/ML injection Inject 0-5 Units into the skin 3 (three) times daily as needed for high blood sugar (only if BGL is 150 or greater). Sliding scale     [provider]  KLOR-CON M20 20 MEQ tablet 10 mEq daily. 10/10/18   [provider]  latanoprost (XALATAN) 0.005 % ophthalmic solution Place 1 drop into both eyes at bedtime. 06/11/17   [provider]  levETIRAcetam (KEPPRA) 250 MG tablet Take 1 tablet (250 mg total) by mouth 2 (two) times daily. 11/07/17   Frann Rider, NP  loratadine (CLARITIN) 10 MG tablet Take 10 mg by mouth daily with lunch.    [provider]  Menthol-Zinc Oxide (CALMOSEPTINE EX) Apply 1 application topically 2 (two) times daily.  [provider]  pantoprazole (PROTONIX) 40 MG tablet Take 40 mg by mouth daily.  05/04/15   [provider]  pindolol (VISKEN) 5 MG tablet Take 1 tablet (5 mg total) by mouth 2 (two) times daily. 10/03/17   Bonnielee Haff, MD  polyethylene glycol powder (GLYCOLAX/MIRALAX) powder Take 17 g by mouth daily as needed for mild constipation.  05/18/14   [provider]  Probiotic Product (PROBIOTIC-10 PO) Take 1 capsule by mouth daily.    [provider]  senna (SENOKOT) 8.6 MG TABS tablet Take 1 tablet by mouth.    [provider]  torsemide (DEMADEX) 10 MG tablet Take 10 mg by mouth daily. 09/15/17   [provider]    Physical Exam:  Constitutional: Elderly male who appears to be more comfortable currently on nasal cannula oxygen Vitals:   02/18/19 0909 02/18/19 0913 02/18/19 0914 02/18/19 1010  BP:   (!) 141/79 140/79  Pulse:   86 86  Resp:   (!) 24 17  Temp:   98.4 F (36.9 C)   TempSrc:   Oral   SpO2: (!) 86% 98% 98% 99%   Eyes: PERRL, lids and conjunctivae normal ENMT: Mucous membranes are moist. Posterior pharynx clear of any exudate or lesions.  Neck: normal, supple, no masses, no thyromegaly.   Positive JVD Respiratory: Positive crackles heard throughout both lung fields but no significant wheezes or rhonchi appreciated.  O2 saturations currently maintained on 3 L of nasal cannula oxygen. Cardiovascular: Regular rate and rhythm, no murmurs / rubs / gallops. No extremity edema. 2+ pedal pulses. No carotid bruits.  Abdomen: no tenderness, no masses palpated. No hepatosplenomegaly. Bowel sounds positive.  Musculoskeletal: no clubbing / cyanosis. No joint deformity upper and lower extremities. Good ROM, no contractures. Normal muscle tone.  Skin: no rashes, lesions, ulcers. No induration Neurologic: CN 2-12 grossly intact. Sensation intact, DTR normal. Strength 5/5 in all 4.  Psychiatric: Normal judgment and insight. Alert and oriented x 3. Normal mood.     Labs on Admission: I have personally reviewed following labs and imaging studies  CBC: Recent Labs  Lab 02/18/19 0924 02/18/19 0931  WBC 8.6  --   NEUTROABS 7.1  --   HGB 11.6* 11.9*  HCT 35.2* 35.0*  MCV 100.3*  --   PLT 217  --    Basic Metabolic Panel: Recent Labs  Lab 02/18/19 0924 02/18/19 0931  NA 139 141  K 4.2 4.2  CL 108 109  CO2 18*  --   GLUCOSE 115* 112*  BUN 44* 41*  CREATININE 3.49* 3.40*  CALCIUM 9.3  --    GFR: CrCl cannot be calculated (Unknown ideal weight.). Liver Function Tests: Recent Labs  Lab 02/18/19 0924  AST 22  ALT 15  ALKPHOS 69  BILITOT 0.6  PROT 7.6  ALBUMIN 3.1*   No results for input(s): LIPASE, AMYLASE in the last 168 hours. No results for input(s): AMMONIA in the last 168 hours. Coagulation Profile: No results for input(s): INR, PROTIME in the last 168 hours. Cardiac Enzymes: No results for input(s): CKTOTAL, CKMB, CKMBINDEX, TROPONINI in the last 168 hours. BNP (last 3 results) No results for input(s): PROBNP in the last 8760 hours. HbA1C: No results for input(s): HGBA1C in the last 72 hours. CBG: No results for input(s): GLUCAP in the last 168 hours. Lipid  Profile: No results for input(s): CHOL, HDL, LDLCALC, TRIG, CHOLHDL, LDLDIRECT in the last 72 hours. Thyroid Function Tests: No results for input(s): TSH,  T4TOTAL, FREET4, T3FREE, THYROIDAB in the last 72 hours. Anemia Panel: No results for input(s): VITAMINB12, FOLATE, FERRITIN, TIBC, IRON, RETICCTPCT in the last 72 hours. Urine analysis:    Component Value Date/Time   COLORURINE YELLOW 09/30/2017 0546   APPEARANCEUR CLOUDY (A) 09/30/2017 0546   LABSPEC 1.011 09/30/2017 0546   PHURINE 8.0 09/30/2017 0546   GLUCOSEU NEGATIVE 09/30/2017 0546   GLUCOSEU 500 03/30/2011 1055   HGBUR NEGATIVE 09/30/2017 0546   HGBUR moderate 10/03/2008 1349   BILIRUBINUR NEGATIVE 09/30/2017 0546   KETONESUR NEGATIVE 09/30/2017 0546   PROTEINUR 100 (A) 09/30/2017 0546   UROBILINOGEN 0.2 11/09/2014 1717   NITRITE POSITIVE (A) 09/30/2017 0546   LEUKOCYTESUR LARGE (A) 09/30/2017 0546   Sepsis Labs: No results found for this or any previous visit (from the past 240 hour(s)).   Radiological Exams on Admission: Dg Chest Port 1 View  Result Date: 02/18/2019 CLINICAL DATA:  Shortness of breath.  Pulmonary edema. EXAM: PORTABLE CHEST 1 VIEW COMPARISON:  October 04, 2017 FINDINGS: Mild cardiomegaly. The hila and mediastinum are normal. No pneumothorax identified. Mild increased interstitial markings, particularly in the bases with Kerley B lines on the left. No nodules or masses. No focal infiltrates. IMPRESSION: Cardiomegaly and probable mild edema. Electronically Signed   By: Dorise Bullion III M.D   On: 02/18/2019 10:24    EKG: Independently reviewed.  Sinus rhythm 85 bpm with ST abnormality and background artifact.  Assessment/Plan Acute respiratory failure with hypoxia secondary to diastolic congestive heart failure exacerbation with pulmonary edema: Patient presents with progressively worsening shortness of breath over the last day.  Patient noted to have lower extremity edema on physical exam.  BNP 825.7.   Chest x-ray showing cardiomegaly with probable mild edema.  Last echocardiogram revealed EF of 55 to 60% with grade 1 diastolic dysfunction in 0/9470.  Patient was given 20 mg of furosemide IV.  At home patient appears to be on torsemide 10 mg daily. - Admit to a telemetry bed - Heart failure orders set  initiated  - Continuous pulse oximetry with nasal cannula oxygen as needed to keep O2 saturations >92% - Strict I&Os and daily weights - Elevate lower extremities - Lasix 20 mg IV Bid - Reassess in a.m. and adjust diuresis as needed. - Check echocardiogram -No ACE inhibitor due to chronic kidney disease -Continue home beta-blocker - May warrant consultation to cardiology in a.m.   Elevated troponin: Acute.  Initial troponin 79.  EKG similar to previous.  Suspect secondary to demand with heart failure exacerbation. -Continue to trend troponin  Acute kidney injury superimposed on chronic kidney disease Stage IV: Patient presents with creatinine elevated up to 3.49 with BUN 44.  Baseline creatinine previously noted to be around 2.9. Followed in the outpatient setting by Dr. Carolin Sicks. -Continue to monitor kidney function with diuresis   Macrocytic anemia: Chronic.  Patient hemoglobin 11.6 g/dL on admission with elevated MCV which does not appear to be new.  Patient with previous history of low vitamin B12 levels back in 08/2017. -Recheck vitamin B12 and folate in a.m.  Metabolic acidosis: On admission CO2 noted to be 18, but no signs of elevated anion gap noted.  Patient on 650 mg of sodium bicarb nightly per nephrology. -Continue sodium bicarb  Diabetes mellitus type 2: At home patient on Lantus 25 units daily after breakfast.  Last hemoglobin A1c noted to be 6 in 08/2017. -Hypoglycemic protocols -Check hemoglobin A1c -Reduce home Lantus dose down to 15 units daily while currently  in the hospital on carb and heart healthy diet. -CBGs before every meal with sensitive SSI   History of seizure  disorder: Patient currently on Keppra 250 mg twice daily. -Continue Keppra  GERD -Continue Protonix   DVT prophylaxis: Heparin Code Status: Full Family Communication: Discussed plan of care with the patient and his sister who is present at bedside Disposition Plan: Possible discharge home in 2 to 3 days Consults called: None Admission status: Inpatient  Norval Morton MD [facility] Pager [phone]   If 7PM-7AM, please contact night-coverage [email or web address] Password [facility]  02/18/2019, 11:36 AM

## 2019-02-18 NOTE — ED Triage Notes (Signed)
Pt arrives from home with complaints of shortness of breath X4. History of CHF and COPD. Pt SpO2 with EMS 86%RA. Placed on 4 liters with relief 99% 1 nitro given en route.

## 2019-02-18 NOTE — Evaluation (Signed)
Physical Therapy Evaluation Patient Details Name: Eric [name] MRN: [id number] DOB: 01/22/1924 Today's Date: 02/18/2019   History of Present Illness   85 y.o. male with a history of CAD, CHF, COPD, diabetes, CKD, hypertension, hyperlipidemia, stroke, BPH, (Patient has some mild memory issues per family) who presents to the emergency department from home via EMS for evaluation of shortness of breath.  Patient reports shortness of breath has been worsening over the past 4 days and became significantly worse this morning.  When EMS arrived patient was found to be satting at 86% on room air, patient was placed on 4 L nasal cannula with improvement of O2 sats to 99%. +LE edema and weight gain  Clinical Impression   Pt admitted with above diagnosis. Patient with decr sats to 86% with OOB to chair while on 3L O2 (and pt normally does not use O2 at home). Unsteady and required min assist to maintain balance and avoid fall. Unclear if pt has 24/7 caregivers or only during the day. He likely is more confused than normal (illness, unfamiliar surroundings), however currently feel he will need 24/7 care upon discharge. Pt currently with functional limitations due to the deficits listed below (see PT Problem List). Pt will benefit from skilled PT to increase their independence and safety with mobility to allow discharge to the venue listed below.       Follow Up Recommendations Home health PT;Supervision/Assistance - 24 hour(if no 24/7 support; recommend SNF)    Equipment Recommendations  None recommended by PT    Recommendations for Other Services OT consult     Precautions / Restrictions Precautions Precautions: Fall      Mobility  Bed Mobility Overal bed mobility: Needs Assistance Bed Mobility: Supine to Sit     Supine to sit: Supervision     General bed mobility comments: scoot to EOB in sitting with min assist (after multiple unsuccessful attempts by pt without  assist)  Transfers Overall transfer level: Needs assistance Equipment used: None Transfers: Sit to/from [facility] Sit to Stand: Min assist Stand pivot transfers: Min assist       General transfer comment: pt unsteady with posterior lean upon initial standing; could stand from bed and chair with min assist  Ambulation/Gait             General Gait Details: deferred due to decr sats with transfer on 3L O2; will need +2 to walk with O2   Stairs            Wheelchair Mobility    Modified Rankin (Stroke Patients Only)       Balance Overall balance assessment: Needs assistance Sitting-balance support: No upper extremity supported;Feet supported Sitting balance-Leahy Scale: Fair   Postural control: Posterior lean Standing balance support: No upper extremity supported Standing balance-Leahy Scale: Poor Standing balance comment: stood for 45 seconds working on wt-shifting and side-steps in front of bed with imbalance requiring min assist                             Pertinent Vitals/Pain Pain Assessment: No/denies pain    Home Living Family/patient expects to be discharged to:: Private residence Living Arrangements: Alone Available Help at Discharge: Family;Personal care attendant(per nsg, has aide all day until bedtime; sister checks in) Type of Home: House Home Access: Stairs to enter Entrance Stairs-Rails: None Entrance Stairs-Number of Steps: 1 Home Layout: One level Home Equipment: Environmental consultant - 2 wheels;Walker - 4 wheels;Cane -  single point;Bedside commode;Shower seat;Wheelchair - manual(info from chart in 2018) Additional Comments: pt confused and therefore used information from previous admission and nursing    Prior Function Level of Independence: Needs assistance   Gait / Transfers Assistance Needed: pt confused and reports he walks without a device           Hand Dominance   Dominant Hand: Right    Extremity/Trunk  Assessment   Upper Extremity Assessment Upper Extremity Assessment: Generalized weakness    Lower Extremity Assessment Lower Extremity Assessment: Generalized weakness    Cervical / Trunk Assessment Cervical / Trunk Assessment: Kyphotic  Communication   Communication: HOH  Cognition Arousal/Alertness: Awake/alert Behavior During Therapy: WFL for tasks assessed/performed Overall Cognitive Status: No family/caregiver present to determine baseline cognitive functioning                                 General Comments: h/o cognitive impairments; oriented to place, situation, year, self; repeated stories and questions multiple times during session      General Comments      Exercises     Assessment/Plan    PT Assessment Patient needs continued PT services  PT Problem List Decreased strength;Decreased activity tolerance;Decreased balance;Decreased mobility;Decreased cognition;Decreased knowledge of use of DME;Decreased safety awareness;Cardiopulmonary status limiting activity       PT Treatment Interventions DME instruction;Gait training;Functional mobility training;Therapeutic activities;Therapeutic exercise;Balance training;Cognitive remediation;Patient/family education    PT Goals (Current goals can be found in the Care Plan section)  Acute Rehab PT Goals Patient Stated Goal: to get better and go home PT Goal Formulation: With patient Time For Goal Achievement: 03/04/19 Potential to Achieve Goals: Good    Frequency Min 3X/week   Barriers to discharge Decreased caregiver support unclear if has 24/7 caregivers    Co-evaluation               AM-PAC PT "6 Clicks" Mobility  Outcome Measure Help needed turning from your back to your side while in a flat bed without using bedrails?: None Help needed moving from lying on your back to sitting on the side of a flat bed without using bedrails?: A Little Help needed moving to and from a bed to a chair  (including a wheelchair)?: A Little Help needed standing up from a chair using your arms (e.g., wheelchair or bedside chair)?: A Little Help needed to walk in hospital room?: A Lot Help needed climbing 3-5 steps with a railing? : Total 6 Click Score: 16    End of Session Equipment Utilized During Treatment: Gait belt;Oxygen Activity Tolerance: Treatment limited secondary to medical complications (Comment) Patient left: in chair;with call bell/phone within reach;with chair alarm set Nurse Communication: Mobility status PT Visit Diagnosis: Unsteadiness on feet (R26.81);Muscle weakness (generalized) (M62.81)    Time: 0962-8366 PT Time Calculation (min) (ACUTE ONLY): 31 min   Charges:   PT Evaluation $PT Eval Moderate Complexity: 1 Mod PT Treatments $Gait Training: 8-22 mins         Barry Brunner, PT Pager [phone]   Rexanne Mano 02/18/2019, 6:00 PM

## 2019-02-18 NOTE — ED Notes (Signed)
XR at bedside

## 2019-02-18 NOTE — ED Provider Notes (Signed)
[facility] EMERGENCY DEPARTMENT Provider Note   CSN: [id number] Arrival date & time: 02/18/19  0908     History   Chief Complaint Chief Complaint  Patient presents with  . Shortness of Breath    HPI Eric [name] is a 85 y.o. male.     Eric [name] is a 85 y.o. male with a history of CAD, CHF, COPD, diabetes, CKD, hypertension, hyperlipidemia, stroke, BPH, who presents to the emergency department from home via EMS for evaluation of shortness of breath.  Patient reports shortness of breath has been worsening over the past 4 days and became significantly worse this morning.  When EMS arrived patient was found to be satting at 86% on room air, patient was placed on 4 L nasal cannula with improvement of O2 sats to 99%.  Patient denies chest pain or chest tightness, but was given 1 dose of nitro in route with EMS, pressure initially 180s, now improved.  Patient remains comfortable on 4 L nasal cannula.  Also reports some swelling in his lower extremities, and caregiver reports weight gain over the past few days.  Patient denies any cough or fevers.  No abdominal pain, nausea or vomiting.  No known sick contacts.  Patient is on torsemide which he has been taking regularly.  No other aggravating or alleviating factors.     Past Medical History:  Diagnosis Date  . Adult failure to thrive   . Altered mental status   . ANEMIA-NOS 02/17/2007  . Anxiety   . BACK PAIN, LUMBAR   . BENIGN PROSTATIC HYPERTROPHY   . Bilateral leg edema   . CAD (coronary artery disease)    a. nonobstructive CAD by cath in 2013  . CAP (community acquired pneumonia)   . CKD (chronic kidney disease)   . DEPRESSION 12/25/2008  . Diabetes mellitus   . DIABETES MELLITUS, TYPE I, CONTROLLED, WITH RETINOPATHY   . Encephalopathy, metabolic   . Gastroparesis   . GERD 02/17/2007  . Hyperlipidemia   . HYPERTENSION 02/17/2007  . Hyponatremia   . LIVER DISORDER   . OSTEOARTHRITIS 03/04/2010  .  Pneumonia   . PSA, INCREASED   . Stroke ([place])   . UTI (urinary tract infection) 07/2017  . VITAMIN B12 DEFICIENCY     Patient Active Problem List   Diagnosis Date Noted  . Syncope 09/30/2017  . Pressure injury of skin 08/28/2017  . AV block, Mobitz 2   . Acute diastolic CHF (congestive heart failure) ([place]) 08/26/2017  . Staring episodes 08/26/2017  . Diastolic CHF ([place]) 22/29/7989  . Elevated troponin 08/26/2017  . HCAP (healthcare-associated pneumonia)   . Severe sepsis ([place])   . Sepsis due to pneumonia ([place]) 10/30/2016  . History of DVT (deep vein thrombosis) 10/30/2016  . UTI (urinary tract infection) 10/03/2016  . Memory loss 06/22/2016  . AKI (acute kidney injury) ([place]) 11/12/2014  . TIA (transient ischemic attack) 11/09/2014  . Seizure ([place]) 02/08/2012  . Lesion of lung 02/08/2012  . DM2 (diabetes mellitus, type 2) ([place]) 02/08/2012  . Hypertensive encephalopathy 01/06/2012  . Hyperglycemia 01/06/2012  . MGUS (monoclonal gammopathy of unknown significance) 10/28/2011  . Bilateral leg edema 10/28/2011  . Fall at home 10/28/2011  . Failure to thrive in adult 10/28/2011  . Type 2 diabetes mellitus with hypoglycemia ([place]) 10/27/2011  . Dysphagia 10/27/2011  . Encephalopathy 10/27/2011  . Hypernatremia 10/24/2011  . Altered mental status 10/20/2011  . Generalized weakness 10/18/2011  . Acute metabolic encephalopathy   .  Dependent edema 10/02/2011  . Hyponatremia 10/02/2011  . Pneumonia 04/24/2011  . CKD (chronic kidney disease) stage 3, GFR 30-59 ml/min ([place]) 04/19/2011  . Bronchitis, acute 04/16/2011  . Edema 03/30/2011  . OSTEOARTHRITIS 03/04/2010  . LIVER DISORDER 12/09/2009  . BACK PAIN, LUMBAR 09/09/2009  . ABDOMINAL PAIN, UNSPECIFIED SITE 01/31/2009  . SYNCOPE 01/08/2009  . ELBOW PAIN, LEFT 12/30/2008  . ABNORMAL ELECTROCARDIOGRAM 12/30/2008  . DEPRESSION 12/25/2008  . WEIGHT LOSS 12/25/2008  . NOSEBLEED 12/25/2008  . VITAMIN B12 DEFICIENCY 10/24/2008  .  DM (diabetes mellitus), type 1, uncontrolled w/ophthalmic complication ([place]) 38/46/6599  . PANCYTOPENIA 10/08/2008  . PSA, INCREASED 10/08/2008  . DYSURIA 10/03/2008  . LEG PAIN, BILATERAL 12/26/2007  . KNEE PAIN, LEFT 08/18/2007  . COUGH 04/18/2007  . Hyperlipidemia 02/17/2007  . ANEMIA-NOS 02/17/2007  . Essential hypertension 02/17/2007  . ALLERGIC RHINITIS 02/17/2007  . GERD 02/17/2007  . GASTROPARESIS 02/17/2007  . Benign prostatic hyperplasia 02/17/2007  . HYPERURICEMIA 02/17/2007    Past Surgical History:  Procedure Laterality Date  . LEFT HEART CATHETERIZATION WITH CORONARY ANGIOGRAM N/A 04/23/2011   Procedure: LEFT HEART CATHETERIZATION WITH CORONARY ANGIOGRAM;  Surgeon: Peter M Martinique, MD;  Location: [facility] CATH LAB;  Service: Cardiovascular;  Laterality: N/A;  . TRANSURETHRAL RESECTION OF PROSTATE          Home Medications    Prior to Admission medications   Medication Sig Start Date End Date Taking? Authorizing Provider  ACCU-CHEK AVIVA PLUS test strip  05/01/14   [provider]  acetaminophen (TYLENOL) 500 MG tablet Take 500 mg by mouth 2 (two) times daily. Reported on 06/19/2015    [provider]  amLODipine (NORVASC) 10 MG tablet Take 10 mg by mouth every evening.  05/26/16   [provider]  B-D UF III MINI PEN NEEDLES 31G X 5 MM MISC  06/20/14   [provider]  calamine lotion Apply 1 application topically 2 (two) times daily as needed for itching (to shoulders/ back).    [provider]  cholecalciferol (VITAMIN D) 1000 UNITS tablet Take 2,000 Units by mouth daily.     [provider]  docusate sodium (COLACE) 100 MG capsule Take 100 mg by mouth 2 (two) times daily.    [provider]  fluticasone (FLONASE) 50 MCG/ACT nasal spray Place 1 spray into both nostrils 2 (two) times daily. Patient taking differently: Place 1 spray into both nostrils 2 (two) times daily as needed for allergies.  10/04/16   Doreatha Lew, MD  guaiFENesin (MUCINEX) 600 MG 12 hr tablet Take 2 tablets (1,200 mg total) by mouth 2 (two) times daily. Patient not taking: Reported on 11/08/2018 08/29/17   Shelly Coss, MD  hydrALAZINE (APRESOLINE) 25 MG tablet TAKE 3 TABLETS BY MOUTH 3  TIMES DAILY 12/28/18   Adrian Prows, MD  hydroxypropyl methylcellulose / hypromellose (ISOPTO TEARS / GONIOVISC) 2.5 % ophthalmic solution Place 1 drop into both eyes 4 (four) times daily.    [provider]  Insulin Glargine (LANTUS SOLOSTAR) 100 UNIT/ML Solostar Pen Inject 22 Units into the skin daily after breakfast.     [provider]  insulin lispro (HUMALOG) 100 UNIT/ML injection Inject 0-5 Units into the skin 3 (three) times daily as needed for high blood sugar (only if BGL is 150 or greater). Sliding scale     [provider]  KLOR-CON M20 20 MEQ tablet 10 mEq daily. 10/10/18   [provider]  latanoprost (XALATAN) 0.005 % ophthalmic  solution Place 1 drop into both eyes at bedtime. 06/11/17   [provider]  levETIRAcetam (KEPPRA) 250 MG tablet Take 1 tablet (250 mg total) by mouth 2 (two) times daily. 11/07/17   Frann Rider, NP  loratadine (CLARITIN) 10 MG tablet Take 10 mg by mouth daily with lunch.    [provider]  Menthol-Zinc Oxide (CALMOSEPTINE EX) Apply 1 application topically 2 (two) times daily.    [provider]  pantoprazole (PROTONIX) 40 MG tablet Take 40 mg by mouth daily.  05/04/15   [provider]  pindolol (VISKEN) 5 MG tablet Take 1 tablet (5 mg total) by mouth 2 (two) times daily. 10/03/17   Bonnielee Haff, MD  polyethylene glycol powder (GLYCOLAX/MIRALAX) powder Take 17 g by mouth daily as needed for mild constipation.  05/18/14   [provider]  Probiotic Product (PROBIOTIC-10 PO) Take 1 capsule by mouth daily.    [provider]  senna (SENOKOT) 8.6 MG TABS tablet Take 1 tablet by mouth.    [provider]   torsemide (DEMADEX) 10 MG tablet Take 10 mg by mouth daily. 09/15/17   [provider]    Family History Family History  Problem Relation Age of Onset  . Cancer Father        had uncertain type of cancer  . Cancer Brother        Prostate Cancer  . Cancer Brother        Prostate Cancer  . Diabetes Other     Social History Social History   Tobacco Use  . Smoking status: Never Smoker  . Smokeless tobacco: Never Used  Substance Use Topics  . Alcohol use: No  . Drug use: No     Allergies   Penicillins, Hydroxyzine, and Percocet [oxycodone-acetaminophen]   Review of Systems Review of Systems  Constitutional: Negative for chills and fever.  HENT: Negative.   Respiratory: Positive for shortness of breath. Negative for cough.   Cardiovascular: Positive for leg swelling. Negative for chest pain and palpitations.  Gastrointestinal: Negative for abdominal pain, diarrhea, nausea and vomiting.  Genitourinary: Negative for dysuria and frequency.  Musculoskeletal: Negative for arthralgias and myalgias.  Skin: Negative for color change and rash.  Neurological: Negative for dizziness, syncope, weakness, light-headedness and numbness.     Physical Exam Updated Vital Signs BP (!) 141/79 (BP Location: Right Arm)   Pulse 86   Temp 98.4 F (36.9 C) (Oral)   Resp (!) 24   SpO2 98%   Physical Exam Vitals signs and nursing note reviewed.  Constitutional:      General: He is not in acute distress.    Appearance: He is well-developed and normal weight. He is ill-appearing. He is not diaphoretic.     Comments: Patient somewhat ill-appearing, in no acute distress.  HENT:     Head: Normocephalic and atraumatic.  Eyes:     General:        Right eye: No discharge.        Left eye: No discharge.     Pupils: Pupils are equal, round, and reactive to light.  Neck:     Musculoskeletal: Neck supple.  Cardiovascular:     Rate and Rhythm: Normal rate and regular rhythm.      Heart sounds: Normal heart sounds. No murmur. No friction rub. No gallop.   Pulmonary:     Effort: Pulmonary effort is normal. No respiratory distress.     Breath sounds: Rales present. No wheezing.  Comments: On 4 L nasal cannula patient appears comfortable, respirations equal and unlabored, mild tachypnea with respiratory rate of 24, on auscultation lungs sound wet with crackles throughout bilateral lung fields.  No wheezes or rhonchi.  Patient does have some audible wheezing when you enter the room, but I suspect this is related to crackles more so. Chest:     Chest wall: No tenderness.  Abdominal:     General: Bowel sounds are normal. There is no distension.     Palpations: Abdomen is soft. There is no mass.     Tenderness: There is no abdominal tenderness. There is no guarding.     Comments: Abdomen soft, nondistended, nontender to palpation in all quadrants without guarding or peritoneal signs  Musculoskeletal:        General: No deformity.     Right lower leg: Edema present.     Left lower leg: Edema present.     Comments: Mild edema of bilateral feet and shins, warm and well perfused.  Skin:    General: Skin is warm and dry.     Capillary Refill: Capillary refill takes less than 2 seconds.  Neurological:     Mental Status: He is alert.     Coordination: Coordination normal.     Comments: Speech is clear, able to follow commands Moves extremities without ataxia, coordination intact  Psychiatric:        Mood and Affect: Mood normal.        Behavior: Behavior normal.      ED Treatments / Results  Labs (all labs ordered are listed, but only abnormal results are displayed) Labs Reviewed  COMPREHENSIVE METABOLIC PANEL - Abnormal; Notable for the following components:      Result Value   CO2 18 (*)    Glucose, Bld 115 (*)    BUN 44 (*)    Creatinine, Ser 3.49 (*)    Albumin 3.1 (*)    GFR calc non Af Amer 14 (*)    GFR calc Af Amer 17 (*)    All other components  within normal limits  CBC WITH DIFFERENTIAL/PLATELET - Abnormal; Notable for the following components:   RBC 3.51 (*)    Hemoglobin 11.6 (*)    HCT 35.2 (*)    MCV 100.3 (*)    All other components within normal limits  BRAIN NATRIURETIC PEPTIDE - Abnormal; Notable for the following components:   B Natriuretic Peptide 825.7 (*)    All other components within normal limits  I-STAT CHEM 8, ED - Abnormal; Notable for the following components:   BUN 41 (*)    Creatinine, Ser 3.40 (*)    Glucose, Bld 112 (*)    TCO2 20 (*)    Hemoglobin 11.9 (*)    HCT 35.0 (*)    All other components within normal limits  TROPONIN I (HIGH SENSITIVITY) - Abnormal; Notable for the following components:   Troponin I (High Sensitivity) 79 (*)    All other components within normal limits  SARS CORONAVIRUS 2 (TAT 6-24 HRS)  TROPONIN I (HIGH SENSITIVITY)    EKG EKG Interpretation  Date/Time:  Sunday February 18 2019 09:16:12 EST Ventricular Rate:  85 PR Interval:    QRS Duration: 107 QT Interval:  386 QTC Calculation: 459 R Axis:   22 Text Interpretation: Sinus rhythm ST-t wave abnormality Artifact No significant change since last tracing Abnormal ECG Confirmed by Carmin Muskrat [phone]) on 02/18/2019 9:21:08 AM   Radiology No results found.  Procedures  Procedures (including critical care time)  Medications Ordered in ED Medications  albuterol (VENTOLIN HFA) 108 (90 Base) MCG/ACT inhaler 4 puff (4 puffs Inhalation Given 02/18/19 1007)  furosemide (LASIX) injection 20 mg (20 mg Intravenous Given 02/18/19 1007)     Initial Impression / Assessment and Plan / ED Course  I have reviewed the triage vital signs and the nursing notes.  Pertinent labs & imaging results that were available during my care of the patient were reviewed by me and considered in my medical decision making (see chart for details).  85 year old male presents with 4 days of worsening shortness of breath, with history of CHF  and COPD.  Satting at 86% on room air, improved to 99% on 4 L.  Hypertensive with EMS given 1 nitro with significant improvement in blood pressure.  Patient denies chest pain.  Reports worsening shortness of breath that is present at rest and with exertion, as well as some worsening lower extremity edema, patient's caretaker also reports some weight gain over the past 4 days.  On exam patient's lungs sound wet with rales in bilateral lung fields.  Presentation most consistent with CHF exacerbation, patient is not wheezing he is not febrile does not have a cough, I have lower suspicion for COPD exacerbation, pneumonia or other infectious etiology.  Will check basic labs, troponin, BNP, chest x-ray, EKG and coronavirus swab.  Remains comfortable on 4 L nasal cannula without significantly increased work of breathing.  Labs show no leukocytosis, stable hemoglobin, patient with chronic CKD with previous creatinine at 2.9, 3.49 today with elevated BUN, normal potassium and no other significant electrolyte derangements, normal liver function.  BNP elevated at 827.7, troponin mildly elevated at 79, but EKG without ischemic changes and I suspect this is in the setting of demand ischemia from CHF exacerbation.  Given elevated creatinine we will give low dose of 20 mg IV Lasix.  Given new oxygen requirement in the setting of CHF exacerbation patient will require inpatient admission.  He remains comfortable on 4 L nasal cannula.  Case discussed with Dr.Rondell Tamala Julian with [facility] hospitalist who will see and admit the patient.  Patient discussed with Dr. Vanita Panda, who saw patient as well and agrees with plan.   Final Clinical Impressions(s) / ED Diagnoses   Final diagnoses:  Acute on chronic congestive heart failure, unspecified heart failure type [facility])  Acute respiratory failure with hypoxia [facility])    ED Discharge Orders    None       Janet Berlin 02/18/19 1238    Carmin Muskrat, MD 02/18/19  1550

## 2019-02-19 ENCOUNTER — Inpatient Hospital Stay (HOSPITAL_COMMUNITY): Payer: Medicare Other

## 2019-02-19 DIAGNOSIS — N189 Chronic kidney disease, unspecified: Secondary | ICD-10-CM

## 2019-02-19 DIAGNOSIS — N179 Acute kidney failure, unspecified: Secondary | ICD-10-CM

## 2019-02-19 DIAGNOSIS — Z794 Long term (current) use of insulin: Secondary | ICD-10-CM

## 2019-02-19 DIAGNOSIS — R778 Other specified abnormalities of plasma proteins: Secondary | ICD-10-CM

## 2019-02-19 DIAGNOSIS — I340 Nonrheumatic mitral (valve) insufficiency: Secondary | ICD-10-CM

## 2019-02-19 DIAGNOSIS — I5033 Acute on chronic diastolic (congestive) heart failure: Secondary | ICD-10-CM

## 2019-02-19 DIAGNOSIS — J9601 Acute respiratory failure with hypoxia: Secondary | ICD-10-CM

## 2019-02-19 DIAGNOSIS — I361 Nonrheumatic tricuspid (valve) insufficiency: Secondary | ICD-10-CM

## 2019-02-19 DIAGNOSIS — E872 Acidosis: Secondary | ICD-10-CM

## 2019-02-19 DIAGNOSIS — E11649 Type 2 diabetes mellitus with hypoglycemia without coma: Secondary | ICD-10-CM

## 2019-02-19 LAB — BASIC METABOLIC PANEL
Anion gap: 10 (ref 5–15)
BUN: 57 mg/dL — ABNORMAL HIGH (ref 8–23)
CO2: 22 mmol/L (ref 22–32)
Calcium: 9.2 mg/dL (ref 8.9–10.3)
Chloride: 106 mmol/L (ref 98–111)
Creatinine, Ser: 3.77 mg/dL — ABNORMAL HIGH (ref 0.61–1.24)
GFR calc Af Amer: 15 mL/min — ABNORMAL LOW (ref >60)
GFR calc non Af Amer: 13 mL/min — ABNORMAL LOW (ref >60)
Glucose, Bld: 123 mg/dL — ABNORMAL HIGH (ref 70–99)
Potassium: 4.1 mmol/L (ref 3.5–5.1)
Sodium: 138 mmol/L (ref 135–145)

## 2019-02-19 LAB — VITAMIN B12: Vitamin B-12: 161 pg/mL — ABNORMAL LOW (ref 180–914)

## 2019-02-19 LAB — HEMOGLOBIN A1C
Hgb A1c MFr Bld: 6.8 % — ABNORMAL HIGH (ref 4.8–5.6)
Mean Plasma Glucose: 148.46 mg/dL

## 2019-02-19 LAB — GLUCOSE, CAPILLARY
Glucose-Capillary: 118 mg/dL — ABNORMAL HIGH (ref 70–99)
Glucose-Capillary: 133 mg/dL — ABNORMAL HIGH (ref 70–99)
Glucose-Capillary: 209 mg/dL — ABNORMAL HIGH (ref 70–99)
Glucose-Capillary: 138 mg/dL — ABNORMAL HIGH (ref 70–99)

## 2019-02-19 LAB — ECHOCARDIOGRAM COMPLETE: Weight: 2846.58 oz

## 2019-02-19 LAB — MRSA PCR SCREENING: MRSA by PCR: POSITIVE — AB

## 2019-02-19 MED ORDER — CHLORHEXIDINE GLUCONATE CLOTH 2 % EX PADS
6.0000 | MEDICATED_PAD | Freq: Every day | CUTANEOUS | Status: DC
  Administered 2019-02-20 – 2019-02-22 (×3): 6 via TOPICAL

## 2019-02-19 MED ORDER — IPRATROPIUM-ALBUTEROL 0.5-2.5 (3) MG/3ML IN SOLN: 3.0000 mL | Freq: Four times a day (QID) | RESPIRATORY_TRACT | Status: DC | PRN

## 2019-02-19 MED ORDER — MUPIROCIN 2 % EX OINT
1.0000 "application " | TOPICAL_OINTMENT | Freq: Two times a day (BID) | CUTANEOUS | Status: DC
  Administered 2019-02-19 – 2019-02-21 (×6): 1 via NASAL
  Filled 2019-02-19 (×2): qty 22

## 2019-02-19 MED ORDER — FUROSEMIDE 10 MG/ML IJ SOLN
30.0000 mg | Freq: Two times a day (BID) | INTRAMUSCULAR | Status: DC
  Filled 2019-02-19: qty 4

## 2019-02-19 MED ORDER — IPRATROPIUM-ALBUTEROL 0.5-2.5 (3) MG/3ML IN SOLN
RESPIRATORY_TRACT | Status: AC
  Filled 2019-02-19: qty 3

## 2019-02-19 MED ORDER — FUROSEMIDE 10 MG/ML IJ SOLN
30.0000 mg | Freq: Two times a day (BID) | INTRAMUSCULAR | Status: DC
  Administered 2019-02-19 (×2): 30 mg via INTRAVENOUS
  Filled 2019-02-19: qty 4

## 2019-02-19 MED ORDER — IPRATROPIUM-ALBUTEROL 0.5-2.5 (3) MG/3ML IN SOLN
3.0000 mL | Freq: Three times a day (TID) | RESPIRATORY_TRACT | Status: DC
  Administered 2019-02-19 (×2): 3 mL via RESPIRATORY_TRACT
  Filled 2019-02-19 (×3): qty 3

## 2019-02-19 NOTE — Progress Notes (Addendum)
Ausculted wheezing sound bilateral lungs. RR 18-20, SPO2 98%. Notified RT for breathing treatment at bedside. Will monitor.  Kennyth Lose, RN

## 2019-02-19 NOTE — TOC Initial Note (Signed)
Transition of Care [facility]) - Initial/Assessment Note    Patient Details  Name: Eric [name] MRN: [id number] Date of Birth: 12-14-1923  Transition of [facility]) CM/SW Contact:    Bartholomew Crews, RN Phone Number: [phone] 02/19/2019, 3:55 PM  Clinical Narrative:                 Spoke with patient and his sister/HCPOA, Vicente Males, at bedside. PTA home alone with caregivers scheduled 3 times per day - private pay. Discussed HH recommendations for PT. Alvis Lemmings accepted referral. Sister states that they have had services from [facility] on two other occasions. Sister did ask about getting wheelchair. Patient would need DME order for lightweight manual wheelchair with qualifying note. Sister intends to transport patient home in private vehicle when ready to transition home. TOC following for transition needs.   Expected Discharge Plan: [place] Barriers to Discharge: Continued Medical Work up   Patient Goals and CMS Choice   CMS Medicare.gov Compare Post Acute Care list provided to:: Patient Choice offered to / list presented to : Patient, [facility] POA / Guardian  Expected Discharge Plan and Services Expected Discharge Plan: [place] In-house Referral: NA Discharge Planning Services: CM Consult Post Acute Care Choice: [place] arrangements for the past 2 months: Single Family Home                 DME Arranged: Wheelchair manual DME Agency: NA       HH Arranged: PT HH Agency: [facility] Date [facility] Agency Contacted: 02/19/19 Time HH Agency Contacted: 1351    Prior Living Arrangements/Services Living arrangements for the past 2 months: Single Family Home Lives with:: Self Patient language and need for interpreter reviewed:: Yes Do you feel safe going back to the place where you live?: Yes      Need for Family Participation in Patient Care: Yes (Comment) Care giver support system in place?: Yes (comment) Current home services: DME, Other  (comment)(private pay caregivers) Criminal Activity/Legal Involvement Pertinent to Current Situation/Hospitalization: No - Comment as needed  Activities of Daily Living      Permission Sought/Granted Permission sought to share information with : Family Supports    Share Information with NAME: Vicente Males     Permission granted to share info w Relationship: sister and HCPOA     Emotional Assessment Appearance:: Appears stated age Attitude/Demeanor/Rapport: Engaged Affect (typically observed): Accepting Orientation: : Oriented to Situation, Oriented to  Time, Oriented to Place, Oriented to Self Alcohol / Substance Use: Not Applicable Psych Involvement: No (comment)  Admission diagnosis:  Acute respiratory failure with hypoxia (HCC) [J96.01] Acute on chronic congestive heart failure, unspecified heart failure type [facility]) [I50.9] Patient Active Problem List   Diagnosis Date Noted  . CHF exacerbation ([place]) 02/18/2019  . Acute respiratory failure with hypoxia ([place]) 02/18/2019  . Metabolic acidosis 76/28/3151  . Acute kidney injury superimposed on CKD ([place]) 02/18/2019  . Syncope 09/30/2017  . Pressure injury of skin 08/28/2017  . AV block, Mobitz 2   . Acute diastolic CHF (congestive heart failure) ([place]) 08/26/2017  . Staring episodes 08/26/2017  . Diastolic CHF ([place]) 76/16/0737  . Elevated troponin 08/26/2017  . HCAP (healthcare-associated pneumonia)   . Severe sepsis ([place])   . Sepsis due to pneumonia ([place]) 10/30/2016  . History of DVT (deep vein thrombosis) 10/30/2016  . UTI (urinary tract infection) 10/03/2016  . Memory loss 06/22/2016  . AKI (acute kidney injury) ([place]) 11/12/2014  .  TIA (transient ischemic attack) 11/09/2014  . Seizure ([place]) 02/08/2012  . Lesion of lung 02/08/2012  . DM2 (diabetes mellitus, type 2) ([place]) 02/08/2012  . Hypertensive encephalopathy 01/06/2012  . Hyperglycemia 01/06/2012  . MGUS (monoclonal gammopathy of unknown significance) 10/28/2011  .  Bilateral leg edema 10/28/2011  . Fall at home 10/28/2011  . Failure to thrive in adult 10/28/2011  . Type 2 diabetes mellitus with hypoglycemia ([place]) 10/27/2011  . Dysphagia 10/27/2011  . Seizure disorder ([place]) 10/27/2011  . Hypernatremia 10/24/2011  . Altered mental status 10/20/2011  . Generalized weakness 10/18/2011  . Acute metabolic encephalopathy   . Dependent edema 10/02/2011  . Hyponatremia 10/02/2011  . Pneumonia 04/24/2011  . CKD (chronic kidney disease) stage 3, GFR 30-59 ml/min ([place]) 04/19/2011  . Bronchitis, acute 04/16/2011  . Edema 03/30/2011  . OSTEOARTHRITIS 03/04/2010  . LIVER DISORDER 12/09/2009  . BACK PAIN, LUMBAR 09/09/2009  . ABDOMINAL PAIN, UNSPECIFIED SITE 01/31/2009  . SYNCOPE 01/08/2009  . ELBOW PAIN, LEFT 12/30/2008  . ABNORMAL ELECTROCARDIOGRAM 12/30/2008  . DEPRESSION 12/25/2008  . WEIGHT LOSS 12/25/2008  . NOSEBLEED 12/25/2008  . VITAMIN B12 DEFICIENCY 10/24/2008  . DM (diabetes mellitus), type 1, uncontrolled w/ophthalmic complication ([place]) 73/42/8768  . PANCYTOPENIA 10/08/2008  . PSA, INCREASED 10/08/2008  . DYSURIA 10/03/2008  . LEG PAIN, BILATERAL 12/26/2007  . KNEE PAIN, LEFT 08/18/2007  . COUGH 04/18/2007  . Hyperlipidemia 02/17/2007  . Macrocytic anemia 02/17/2007  . Essential hypertension 02/17/2007  . ALLERGIC RHINITIS 02/17/2007  . GERD 02/17/2007  . GASTROPARESIS 02/17/2007  . Benign prostatic hyperplasia 02/17/2007  . HYPERURICEMIA 02/17/2007   PCP:  [name], MD Pharmacy:   [id number] [place], [place] [address] Phone: [phone] Fax: [phone]  [place], [place] [facility] [address] [place] [address] [place] [address] Phone: [phone] Fax: [phone]     Social Determinants of Health ([place]) Interventions    Readmission Risk Interventions No flowsheet data found.

## 2019-02-19 NOTE — Progress Notes (Signed)
PROGRESS NOTE    Eric [name]  DQQ:[id number]  DOB: [birth year]  DOA: 02/18/2019 PCP: Eric Hatchet, MD   Brief Admission Hx: 85 y.o. male with medical history significant of diastolic CHF, CKD stage IV, HTN, chronic anemia, seizure disorder, and diabetes mellitus type 2 who presented with complaints of shortness of breath and acute CHF exacerbation.   MDM/Assessment & Plan:   1. Acute on chronic respiratory failure with hypoxia - exacerbated by acute diastolic CHF exacerbation.  Pt is being diuresed with IV lasix (gently). He continues to have pulmonary edema and wheezing.  Continue current treatments.  2. Acute diastolic CHF exacerbation - continue IV lasix.  Monitor I/O and weights.  I called and requested a consult from his cardiologist Dr. Einar Gip.   3. Elevated troponin - demand ischemia from CHF exacerbation.  4. Chronic metabolic acidosis- continue sodium bicarbonate prescribed by his nephrologist.  5. Type 2 DM - monitor CBG closely and provide SSI coverage and titrate treatment as needed.  6. GERD - protonix ordered for GI protection.  7. Stage IV CKD - monitor creatinine closely in setting of IV diuresis and asked cardiologist to help [place] with diuresis.    DVT prophylaxis: subcut heparin  Code Status: Full  Family Communication: sister  Disposition Plan: inpatient   Consultants:  Dr. Einar Gip   Procedures:    Antimicrobials:     Subjective: Pt says he is starting to feel better today.  Foley bag has clear urine.    Objective: Vitals:   02/18/19 2029 02/19/19 0335 02/19/19 0602 02/19/19 0658  BP: 132/75 122/74    Pulse: 79 87 88   Resp: 13 20 18    Temp: 98.6 F (37 C) 98.4 F (36.9 C)    TempSrc: Oral Oral    SpO2: 100% 99% 100% 100%  Weight:  80.7 kg      Intake/Output Summary (Last 24 hours) at 02/19/2019 1008 Last data filed at 02/19/2019 1941 Gross per 24 hour  Intake 253 ml  Output 1000 ml  Net -747 ml   Filed Weights   02/19/19 0335   Weight: 80.7 kg     REVIEW OF SYSTEMS  As per history otherwise all reviewed and reported negative  Exam:  General exam: elderly male awake alert oriented, no distress, cooperative.  Respiratory system: Clear. No increased work of breathing. Cardiovascular system: S1 & S2 heard. No JVD, murmurs, gallops, clicks or pedal edema. Gastrointestinal system: Abdomen is nondistended, soft and nontender. Normal bowel sounds heard. Central nervous system: Alert and oriented. No focal neurological deficits. Extremities: trace edema BLEs.  Data Reviewed: Basic Metabolic Panel: Recent Labs  Lab 02/18/19 0924 02/18/19 0931 02/19/19 0428  NA 139 141 138  K 4.2 4.2 4.1  CL 108 109 106  CO2 18*  --  22  GLUCOSE 115* 112* 123*  BUN 44* 41* 57*  CREATININE 3.49* 3.40* 3.77*  CALCIUM 9.3  --  9.2   Liver Function Tests: Recent Labs  Lab 02/18/19 0924  AST 22  ALT 15  ALKPHOS 69  BILITOT 0.6  PROT 7.6  ALBUMIN 3.1*   No results for input(s): LIPASE, AMYLASE in the last 168 hours. No results for input(s): AMMONIA in the last 168 hours. CBC: Recent Labs  Lab 02/18/19 0924 02/18/19 0931  WBC 8.6  --   NEUTROABS 7.1  --   HGB 11.6* 11.9*  HCT 35.2* 35.0*  MCV 100.3*  --   PLT 217  --    Cardiac Enzymes:  No results for input(s): CKTOTAL, CKMB, CKMBINDEX, TROPONINI in the last 168 hours. CBG (last 3)  Recent Labs    02/18/19 1605 02/18/19 2033 02/19/19 0612  GLUCAP 167* 161* 118*   Recent Results (from the past 240 hour(s))  SARS CORONAVIRUS 2 (TAT 6-24 HRS) Nasopharyngeal Nasopharyngeal Swab     Status: None   Collection Time: 02/18/19 10:10 AM   Specimen: Nasopharyngeal Swab  Result Value Ref Range Status   SARS Coronavirus 2 NEGATIVE NEGATIVE Final    Comment: (NOTE) SARS-CoV-2 target nucleic acids are NOT DETECTED. The SARS-CoV-2 RNA is generally detectable in upper and lower respiratory specimens during the acute phase of infection. Negative results do not  preclude SARS-CoV-2 infection, do not rule out co-infections with other pathogens, and should not be used as the sole basis for treatment or other patient management decisions. Negative results must be combined with clinical observations, patient history, and epidemiological information. The expected result is Negative. Fact Sheet for Patients: SugarRoll.be Fact Sheet for Healthcare Providers: https://www.woods-mathews.com/ This test is not yet approved or cleared by the [place] FDA and  has been authorized for detection and/or diagnosis of SARS-CoV-2 by FDA under an Emergency Use Authorization (EUA). This EUA will remain  in effect (meaning this test can be used) for the duration of the COVID-19 declaration under Section 56 4(b)(1) of the Act, 21 U.S.C. section 360bbb-3(b)(1), unless the authorization is terminated or revoked sooner. Performed at [facility], [place] [address], [place], [place] [address]      Studies: Dg Chest Port 1 View  Result Date: 02/19/2019 CLINICAL DATA:  Pulmonary edema. EXAM: PORTABLE CHEST 1 VIEW COMPARISON:  02/18/2019 FINDINGS: Lungs are adequately inflated and demonstrate mild elevation of the right hemidiaphragm. There is worsening bilateral hazy perihilar opacification likely due to interstitial edema and less likely infection. Stable opacification in the left retrocardiac region which may be due to effusion/atelectasis. Mild stable cardiomegaly. Remainder of the exam is unchanged. IMPRESSION: Mild stable cardiomegaly with findings suggesting worsening interstitial edema and less likely infection. Possible small left effusion/atelectasis. Electronically Signed   By: Marin Olp M.D.   On: 02/19/2019 07:12   Dg Chest Port 1 View  Result Date: 02/18/2019 CLINICAL DATA:  Shortness of breath.  Pulmonary edema. EXAM: PORTABLE CHEST 1 VIEW COMPARISON:  October 04, 2017 FINDINGS: Mild cardiomegaly. The hila and  mediastinum are normal. No pneumothorax identified. Mild increased interstitial markings, particularly in the bases with Kerley B lines on the left. No nodules or masses. No focal infiltrates. IMPRESSION: Cardiomegaly and probable mild edema. Electronically Signed   By: Dorise Bullion III M.D   On: 02/18/2019 10:24     Scheduled Meds: . amLODipine  10 mg Oral QPM  . furosemide  30 mg Intravenous BID  . heparin  5,000 Units Subcutaneous Q8H  . hydrALAZINE  75 mg Oral TID  . insulin aspart  0-9 Units Subcutaneous TID WC  . ipratropium-albuterol  3 mL Nebulization TID  . latanoprost  1 drop Both Eyes QHS  . levETIRAcetam  250 mg Oral BID  . loratadine  10 mg Oral Q lunch  . pantoprazole  40 mg Oral Daily  . pindolol  5 mg Oral BID  . potassium chloride SA  10 mEq Oral Daily  . senna  1 tablet Oral Q Sun   And  . [START ON 02/20/2019] senna  1 tablet Oral Q Tue   And  . [START ON 02/22/2019] senna  1 tablet Oral  Q Thu  . sodium bicarbonate  650 mg Oral QHS  . sodium chloride flush  3 mL Intravenous Q12H   Continuous Infusions: . sodium chloride      Principal Problem:   Acute respiratory failure with hypoxia (HCC) Active Problems:   Macrocytic anemia   Type 2 diabetes mellitus with hypoglycemia (HCC)   Seizure disorder (HCC)   Elevated troponin   CHF exacerbation (HCC)   Metabolic acidosis   Acute kidney injury superimposed on CKD ([place])  Time spent:   Irwin Brakeman, MD [facility] 02/19/2019, 10:08 AM    LOS: 1 day  How to contact the [facility] Attending or Consulting provider [place] or covering provider during after hours [place], for this patient?  1. Check the care team in [facility] and look for a) attending/consulting TRH provider listed and b) the [facility] team listed 2. Log into [email or web address] and use [place]'s universal password to access. If you do not have the password, please contact the hospital operator. 3. Locate the [facility] provider you are looking for under [facility] and page to a number that you can be directly reached. 4. If you still have difficulty reaching the provider, please page the [facility] (Director on Call) for the Hospitalists listed on amion for assistance.

## 2019-02-19 NOTE — Progress Notes (Signed)
Pt appeared alert and oriented x 3, disoriented at time. Able to follow commands. Vital sings stable, no acute distress noted. Continue to monitor.      02/18/19 2029  Vitals  Temp 98.6 F (37 C)  Temp Source Oral  BP 132/75  MAP (mmHg) 92  BP Location Left Arm  BP Method Automatic  Patient Position (if appropriate) Lying  Pulse Rate 79  Pulse Rate Source Monitor  ECG Heart Rate 79  Cardiac Rhythm NSR;Heart block  Heart Block Type 1st degree AVB  Resp 13  Level of Consciousness  Level of Consciousness Alert  Oxygen Therapy  SpO2 100 %  O2 Device Nasal Cannula  O2 Flow Rate (L/min) 3 L/min  Pain Assessment  Pain Scale 0-10  Pain Score 0  PCA/Epidural/Spinal Assessment  Respiratory Pattern Regular;Unlabored;Symmetrical  Glasgow Coma Scale  Eye Opening 4  Best Verbal Response (NON-intubated) 4  Best Motor Response 6  Glasgow Coma Scale Score 14  MEWS Score  MEWS RR 1  MEWS Pulse 0  MEWS Systolic 0  MEWS LOC 0  MEWS Temp 0  MEWS Score 1  MEWS Score Color Green   Kennyth Lose, RN

## 2019-02-19 NOTE — Progress Notes (Signed)
  Echocardiogram 2D Echocardiogram has been performed.  Eric [name] 02/19/2019, 4:39 PM

## 2019-02-19 NOTE — Progress Notes (Signed)
02/19/2019 1315 Received pt to room 4E-17 from ED.  Received bedside report from ED nurse.  Pt is Alert and without C/O.  On 3L per [place] and breathing without difficulty.  Tele monitor applied and CCMD notified.  CHG bath given.  Oriented to room, call light and bed.  Call bell in reach, sister at bedside. [place]

## 2019-02-20 LAB — BASIC METABOLIC PANEL
Anion gap: 15 (ref 5–15)
BUN: 63 mg/dL — ABNORMAL HIGH (ref 8–23)
CO2: 21 mmol/L — ABNORMAL LOW (ref 22–32)
Calcium: 9.4 mg/dL (ref 8.9–10.3)
Chloride: 99 mmol/L (ref 98–111)
Creatinine, Ser: 3.9 mg/dL — ABNORMAL HIGH (ref 0.61–1.24)
GFR calc Af Amer: 14 mL/min — ABNORMAL LOW (ref >60)
GFR calc non Af Amer: 12 mL/min — ABNORMAL LOW (ref >60)
Glucose, Bld: 162 mg/dL — ABNORMAL HIGH (ref 70–99)
Potassium: 4 mmol/L (ref 3.5–5.1)
Sodium: 135 mmol/L (ref 135–145)

## 2019-02-20 LAB — GLUCOSE, CAPILLARY
Glucose-Capillary: 162 mg/dL — ABNORMAL HIGH (ref 70–99)
Glucose-Capillary: 171 mg/dL — ABNORMAL HIGH (ref 70–99)
Glucose-Capillary: 165 mg/dL — ABNORMAL HIGH (ref 70–99)
Glucose-Capillary: 198 mg/dL — ABNORMAL HIGH (ref 70–99)

## 2019-02-20 MED ORDER — FUROSEMIDE 10 MG/ML IJ SOLN: 30.0000 mg | Freq: Every day | INTRAMUSCULAR | Status: DC

## 2019-02-20 MED ORDER — FUROSEMIDE 10 MG/ML IJ SOLN
20.0000 mg | Freq: Every day | INTRAMUSCULAR | Status: DC
  Administered 2019-02-20 – 2019-02-22 (×3): 20 mg via INTRAVENOUS
  Filled 2019-02-20 (×3): qty 2

## 2019-02-20 NOTE — Progress Notes (Signed)
Patient suffers from CHF and memory issues which impairs their ability to perform ADLs and ambulation in the home.  A walker alone will not resolve the issues with performing activities of daily living. A wheelchair will allow patient to safely perform daily activities.  The patient can self propel in the home or has a caregiver who can provide assistance.      Zettie Cooley, DPT Acute Rehabilitation Services Pager [phone] Office [phone]

## 2019-02-20 NOTE — Progress Notes (Signed)
Physical Therapy Treatment Patient Details Name: Eric [name] MRN: [id number] DOB: 16-Jan-1924 Today's Date: 02/20/2019    History of Present Illness  85 y.o. male with a history of CAD, CHF, COPD, diabetes, CKD, hypertension, hyperlipidemia, stroke, BPH, (Patient has some mild memory issues per family) who presents to the emergency department from home via EMS for evaluation of shortness of breath.  Patient reports shortness of breath has been worsening over the past 4 days and became significantly worse this morning.  When EMS arrived patient was found to be satting at 86% on room air, patient was placed on 4 L nasal cannula with improvement of O2 sats to 99%. +LE edema and weight gain    PT Comments    Patient requires assist with bed mobility today as well as Mod-Max A for standing transfers from elevated bed height. Pt fatigues very easily and after second stand, unable to get fully upright. Demonstrates marked weakness in BLEs with flexed trunk. Tolerated SPT to chair with Mod A of 2 with pt attempting to take steps but ended up prematurely sitting in chair. Sp02 remained >90% on 1.5L/min 02. Due to requiring more assist with all mobility, recommend hands on 24/7 care/assist if pt returns home. If not able to provide this, recommend SNF. Pt is a high fall risk due to weakness and impaired cognition. Will follow.   Follow Up Recommendations  Home health PT;Supervision/Assistance - 24 hour(if no 24/7, recommend SNF)     Equipment Recommendations  None recommended by PT    Recommendations for Other Services       Precautions / Restrictions Precautions Precautions: Fall Restrictions Weight Bearing Restrictions: No    Mobility  Bed Mobility Overal bed mobility: Needs Assistance Bed Mobility: Supine to Sit     Supine to sit: Mod assist;HOB elevated     General bed mobility comments: Able to bring LEs to EOB but needs assist with trunk and scooting bottom to  EOB.  Transfers Overall transfer level: Needs assistance Equipment used: Rolling walker (2 wheeled) Transfers: Sit to/from Stand Sit to Stand: Mod assist;From elevated surface;Max assist Stand pivot transfers: Mod assist;+2 safety/equipment       General transfer comment: Initially mod A of 2 to stand from elevated bed height digressing to Max A of 2 once fatigued with difficulty getting fully upright. Slow to rise. Stood from [employer], from chair x3. Performed SPT to chair with Mod A with uncontrolled descent into chair, difficulty moving feet.  Ambulation/Gait             General Gait Details: Unable.   Stairs             Wheelchair Mobility    Modified Rankin (Stroke Patients Only)       Balance Overall balance assessment: Needs assistance Sitting-balance support: Feet supported;No upper extremity supported Sitting balance-Leahy Scale: Fair Sitting balance - Comments: posterior bias Postural control: Posterior lean Standing balance support: During functional activity Standing balance-Leahy Scale: Poor Standing balance comment: requires BUe support and external support for standing.                            Cognition Arousal/Alertness: Awake/alert Behavior During Therapy: WFL for tasks assessed/performed Overall Cognitive Status: No family/caregiver present to determine baseline cognitive functioning  General Comments: h/o cognitive impairments; oriented to self only, not location or date.      Exercises      General Comments General comments (skin integrity, edema, etc.): Sp02 remained >90% on 1.5L/min 02 during session.      Pertinent Vitals/Pain Pain Assessment: No/denies pain    Home Living                      Prior Function            PT Goals (current goals can now be found in the care plan section) Progress towards PT goals: Not progressing toward goals - comment(due  to fatigue/weakness)    Frequency    Min 3X/week      PT Plan Current plan remains appropriate    Co-evaluation              AM-PAC PT "6 Clicks" Mobility   Outcome Measure  Help needed turning from your back to your side while in a flat bed without using bedrails?: A Little Help needed moving from lying on your back to sitting on the side of a flat bed without using bedrails?: A Lot Help needed moving to and from a bed to a chair (including a wheelchair)?: A Lot Help needed standing up from a chair using your arms (e.g., wheelchair or bedside chair)?: A Lot Help needed to walk in hospital room?: Total Help needed climbing 3-5 steps with a railing? : Total 6 Click Score: 11    End of Session Equipment Utilized During Treatment: Gait belt;Oxygen Activity Tolerance: Patient limited by fatigue Patient left: in chair;with call bell/phone within reach;with chair alarm set Nurse Communication: Mobility status PT Visit Diagnosis: Unsteadiness on feet (R26.81);Muscle weakness (generalized) (M62.81)     Time: 1100-1116 PT Time Calculation (min) (ACUTE ONLY): 16 min  Charges:  $Therapeutic Activity: 8-22 mins                     Marisa Severin, PT, DPT Acute Rehabilitation Services Pager [phone] Office [phone]       Marguarite Arbour A Sabra Heck 02/20/2019, 1:11 PM

## 2019-02-20 NOTE — Progress Notes (Signed)
Patient suffers from gait instability which impairs their ability to perform daily activities like bathing, dressing, feeding and grooming in the home. A cane, crutch or walker will not resolve  issue with performing activities of daily living. A wheelchair will allow patient to safely perform daily activities. Patient is not able to propel themselves in the home using a standard weight wheelchair due to arm weakness, endurance and general weakness. Patient can self propel in the lightweight wheelchair. Length of need Lifetime.  Accessories: elevating leg rests (ELRs), wheel locks, extensions and anti-tippers.        Manya Silvas, RN MSN CCM Transitions of Care [phone]

## 2019-02-20 NOTE — TOC Progression Note (Signed)
Transition of Care [facility]) - Progression Note    Patient Details  Name: Eric [name] MRN: [id number] Date of Birth: 13-Aug-1923  Transition of Care [facility] CM/SW [name], [place] Phone Number: 02/20/2019, 2:07 PM  Clinical Narrative:     CSW consulted for possible SNF placement. CSW called and spoke with patient's sister and Chauncey [place], Vicente Males. She informed CSW the plan is and has always been  for the patient to discharge home with [facility]. She confirms the patient has adequate care assist him at home.   RNCM- updated   Thurmond Butts, MSW, [facility] Clinical Social Worker [phone]   Expected Discharge Plan: [place] Barriers to Discharge: Continued Medical Work up  Expected Discharge Plan and Services Expected Discharge Plan: [place] In-house Referral: NA Discharge Planning Services: CM Consult Post Acute Care Choice: [place] arrangements for the past 2 months: Single Family Home                 DME Arranged: Wheelchair manual DME Agency: NA       HH Arranged: PT HH Agency: [place] Date [place]: 02/19/19 Time HH Agency Contacted: [phone]     Social Determinants of Health (SDOH) Interventions    Readmission Risk Interventions No flowsheet data found.

## 2019-02-20 NOTE — Progress Notes (Signed)
PROGRESS NOTE    Eric [name]  DEY:[id number]  DOB: [birth year]  DOA: 02/18/2019 PCP: [name], MD   Brief Admission Hx: 85 y.o. male with medical history significant of diastolic CHF, CKD stage IV, HTN, chronic anemia, seizure disorder, and diabetes mellitus type 2 who presented with complaints of shortness of breath and acute CHF exacerbation.   MDM/Assessment & Plan:   1. Acute on chronic respiratory failure with hypoxia - exacerbated by acute diastolic CHF exacerbation.  He is clinically improving and likely will be ready to discharge tomorrow.  Pt is being diuresed with IV lasix (gently). Continue current treatments.  2. Acute diastolic CHF exacerbation - He is diuresing nicely.  Reduce dose of IV lasix with slight bump in creatinine.  Continue IV lasix at reduced dose.  Monitor I/O and weights.  I called and requested a consult from his cardiologist Dr. Einar Gip.   3. Elevated troponin - demand ischemia from CHF exacerbation.  4. Chronic metabolic acidosis- secondary to CKD, continue sodium bicarbonate prescribed by his nephrologist.  5. Type 2 DM - monitor CBG closely and provide SSI coverage and titrate treatment as needed.  6. GERD - protonix ordered for GI protection.  7. Stage IV CKD - monitor creatinine closely in setting of IV diuresis.  Follow up with his outpatient nephrologist.      DVT prophylaxis: subcut heparin  Code Status: Full  Family Communication: sister  Disposition Plan: remain inpatient for IV diuresis, planning home with [facility] versus SNF (care management helping [place] to arrange disposition)   Consultants:  Dr. Einar Gip   Procedures:    Antimicrobials:     Subjective: Pt says he is starting to feel better today.  He reports being much less SOB.     Objective: Vitals:   02/20/19 0423 02/20/19 0600 02/20/19 0700 02/20/19 0809  BP: (!) 156/78   140/77  Pulse: 91 88 91 86  Resp: 17 (!) 58 16 19  Temp: 98.3 F (36.8 C)   (!) 97.4 F (36.3 C)   TempSrc: Oral   Oral  SpO2: 98% 96% 100% 99%  Weight: 76.6 kg       Intake/Output Summary (Last 24 hours) at 02/20/2019 0905 Last data filed at 02/20/2019 0436 Gross per 24 hour  Intake 360 ml  Output 1700 ml  Net -1340 ml   Filed Weights   02/19/19 0335 02/20/19 0423  Weight: 80.7 kg 76.6 kg     REVIEW OF SYSTEMS  As per history otherwise all reviewed and reported negative  Exam:  General exam: elderly male awake alert oriented, no distress, cooperative.  Respiratory system: improved bibasilar wheezing. No increased work of breathing. Cardiovascular system: S1 & S2 heard. No JVD, murmurs, gallops, clicks or pedal edema. Gastrointestinal system: Abdomen is nondistended, soft and nontender. Normal bowel sounds heard. Central nervous system: Alert and oriented. No focal neurological deficits. Extremities: trace edema BLEs.  Data Reviewed: Basic Metabolic Panel: Recent Labs  Lab 02/18/19 0924 02/18/19 0931 02/19/19 0428 02/20/19 0304  NA 139 141 138 135  K 4.2 4.2 4.1 4.0  CL 108 109 106 99  CO2 18*  --  22 21*  GLUCOSE 115* 112* 123* 162*  BUN 44* 41* 57* 63*  CREATININE 3.49* 3.40* 3.77* 3.90*  CALCIUM 9.3  --  9.2 9.4   Liver Function Tests: Recent Labs  Lab 02/18/19 0924  AST 22  ALT 15  ALKPHOS 69  BILITOT 0.6  PROT 7.6  ALBUMIN 3.1*   No  results for input(s): LIPASE, AMYLASE in the last 168 hours. No results for input(s): AMMONIA in the last 168 hours. CBC: Recent Labs  Lab 02/18/19 0924 02/18/19 0931  WBC 8.6  --   NEUTROABS 7.1  --   HGB 11.6* 11.9*  HCT 35.2* 35.0*  MCV 100.3*  --   PLT 217  --    Cardiac Enzymes: No results for input(s): CKTOTAL, CKMB, CKMBINDEX, TROPONINI in the last 168 hours. CBG (last 3)  Recent Labs    02/19/19 1708 02/19/19 2126 02/20/19 0609  GLUCAP 209* 138* 162*   Recent Results (from the past 240 hour(s))  SARS CORONAVIRUS 2 (TAT 6-24 HRS) Nasopharyngeal Nasopharyngeal Swab     Status: None    Collection Time: 02/18/19 10:10 AM   Specimen: Nasopharyngeal Swab  Result Value Ref Range Status   SARS Coronavirus 2 NEGATIVE NEGATIVE Final    Comment: (NOTE) SARS-CoV-2 target nucleic acids are NOT DETECTED. The SARS-CoV-2 RNA is generally detectable in upper and lower respiratory specimens during the acute phase of infection. Negative results do not preclude SARS-CoV-2 infection, do not rule out co-infections with other pathogens, and should not be used as the sole basis for treatment or other patient management decisions. Negative results must be combined with clinical observations, patient history, and epidemiological information. The expected result is Negative. Fact Sheet for Patients: SugarRoll.be Fact Sheet for Healthcare Providers: https://www.woods-mathews.com/ This test is not yet approved or cleared by the [place] FDA and  has been authorized for detection and/or diagnosis of SARS-CoV-2 by FDA under an Emergency Use Authorization (EUA). This EUA will remain  in effect (meaning this test can be used) for the duration of the COVID-19 declaration under Section 56 4(b)(1) of the Act, 21 U.S.C. section 360bbb-3(b)(1), unless the authorization is terminated or revoked sooner. Performed at [facility], [place] [address], [place], [place] [address]   MRSA PCR Screening     Status: Abnormal   Collection Time: 02/19/19  9:00 AM   Specimen: Nasal Mucosa; Nasopharyngeal  Result Value Ref Range Status   MRSA by PCR POSITIVE (A) NEGATIVE Final    Comment:        The GeneXpert MRSA Assay (FDA approved for NASAL specimens only), is one component of a comprehensive MRSA colonization surveillance program. It is not intended to diagnose MRSA infection nor to guide or monitor treatment for MRSA infections. RESULT CALLED TO, READ BACK BY AND VERIFIED WITH: Kathaleen Grinder RN 11:10 02/19/19 ([email or web address]) Performed at [place], [place] [address], [place], [place] [address]      Studies: Dg Chest Port 1 View  Result Date: 02/19/2019 CLINICAL DATA:  Pulmonary edema. EXAM: PORTABLE CHEST 1 VIEW COMPARISON:  02/18/2019 FINDINGS: Lungs are adequately inflated and demonstrate mild elevation of the right hemidiaphragm. There is worsening bilateral hazy perihilar opacification likely due to interstitial edema and less likely infection. Stable opacification in the left retrocardiac region which may be due to effusion/atelectasis. Mild stable cardiomegaly. Remainder of the exam is unchanged. IMPRESSION: Mild stable cardiomegaly with findings suggesting worsening interstitial edema and less likely infection. Possible small left effusion/atelectasis. Electronically Signed   By: Marin Olp M.D.   On: 02/19/2019 07:12   Dg Chest Port 1 View  Result Date: 02/18/2019 CLINICAL DATA:  Shortness of breath.  Pulmonary edema. EXAM: PORTABLE CHEST 1 VIEW COMPARISON:  October 04, 2017 FINDINGS: Mild cardiomegaly. The hila and mediastinum are normal. No pneumothorax identified. Mild increased interstitial markings, particularly in  the bases with Kerley B lines on the left. No nodules or masses. No focal infiltrates. IMPRESSION: Cardiomegaly and probable mild edema. Electronically Signed   By: Dorise Bullion III M.D   On: 02/18/2019 10:24     Scheduled Meds: . amLODipine  10 mg Oral QPM  . Chlorhexidine Gluconate Cloth  6 each Topical Q0600  . furosemide  20 mg Intravenous Daily  . heparin  5,000 Units Subcutaneous Q8H  . hydrALAZINE  75 mg Oral TID  . insulin aspart  0-9 Units Subcutaneous TID WC  . ipratropium-albuterol  3 mL Nebulization TID  . latanoprost  1 drop Both Eyes QHS  . levETIRAcetam  250 mg Oral BID  . loratadine  10 mg Oral Q lunch  . mupirocin ointment  1 application Nasal BID  . pantoprazole  40 mg Oral Daily  . pindolol  5 mg Oral BID  . potassium chloride SA  10 mEq Oral Daily  . senna  1 tablet Oral Q Sun   And  .  senna  1 tablet Oral Q Tue   And  . [START ON 02/22/2019] senna  1 tablet Oral Q Thu  . sodium bicarbonate  650 mg Oral QHS  . sodium chloride flush  3 mL Intravenous Q12H   Continuous Infusions: . sodium chloride      Principal Problem:   Acute respiratory failure with hypoxia (HCC) Active Problems:   Macrocytic anemia   Type 2 diabetes mellitus with hypoglycemia (HCC)   Seizure disorder (HCC)   Elevated troponin   CHF exacerbation (HCC)   Metabolic acidosis   Acute kidney injury superimposed on CKD ([place])  Time spent:   Irwin Brakeman, MD [facility] 02/20/2019, 9:05 AM    LOS: 2 days  How to contact the [facility] Attending or Consulting provider [facility] or covering provider during after hours [place], for this patient?  1. Check the care team in [facility] and look for a) attending/consulting TRH provider listed and b) the [facility] team listed 2. Log into [email or web address] and use [place]'s universal password to access. If you do not have the password, please contact the hospital operator. 3. Locate the [facility] provider you are looking for under [facility] and page to a number that you can be directly reached. 4. If you still have difficulty reaching the provider, please page the [facility] (Director on Call) for the Hospitalists listed on amion for assistance.

## 2019-02-20 NOTE — Plan of Care (Signed)
Continue to monitor

## 2019-02-21 LAB — CBC
HCT: 31.1 % — ABNORMAL LOW (ref 39.0–52.0)
Hemoglobin: 10.4 g/dL — ABNORMAL LOW (ref 13.0–17.0)
MCH: 33 pg (ref 26.0–34.0)
MCHC: 33.4 g/dL (ref 30.0–36.0)
MCV: 98.7 fL (ref 80.0–100.0)
Platelets: 200 10*3/uL (ref 150–400)
RBC: 3.15 MIL/uL — ABNORMAL LOW (ref 4.22–5.81)
RDW: 12.8 % (ref 11.5–15.5)
WBC: 5.4 10*3/uL (ref 4.0–10.5)
nRBC: 0 % (ref 0.0–0.2)

## 2019-02-21 LAB — BASIC METABOLIC PANEL
Anion gap: 13 (ref 5–15)
BUN: 69 mg/dL — ABNORMAL HIGH (ref 8–23)
CO2: 22 mmol/L (ref 22–32)
Calcium: 9.3 mg/dL (ref 8.9–10.3)
Chloride: 101 mmol/L (ref 98–111)
Creatinine, Ser: 3.86 mg/dL — ABNORMAL HIGH (ref 0.61–1.24)
GFR calc Af Amer: 15 mL/min — ABNORMAL LOW (ref >60)
GFR calc non Af Amer: 13 mL/min — ABNORMAL LOW (ref >60)
Glucose, Bld: 147 mg/dL — ABNORMAL HIGH (ref 70–99)
Potassium: 4.2 mmol/L (ref 3.5–5.1)
Sodium: 136 mmol/L (ref 135–145)

## 2019-02-21 LAB — GLUCOSE, CAPILLARY
Glucose-Capillary: 150 mg/dL — ABNORMAL HIGH (ref 70–99)
Glucose-Capillary: 160 mg/dL — ABNORMAL HIGH (ref 70–99)
Glucose-Capillary: 154 mg/dL — ABNORMAL HIGH (ref 70–99)
Glucose-Capillary: 141 mg/dL — ABNORMAL HIGH (ref 70–99)

## 2019-02-21 LAB — FOLATE RBC
Folate, Hemolysate: 331 ng/mL
Folate, RBC: UNDETERMINED ng/mL

## 2019-02-21 NOTE — Progress Notes (Signed)
PROGRESS NOTE    Eric [name]  WIO:[id number]  DOB: 1923/05/03  DOA: 02/18/2019 PCP: [name], MD   Brief Admission Hx: 85 y.o. male with medical history significant of diastolic CHF, CKD stage IV, HTN, chronic anemia, seizure disorder, and diabetes mellitus type 2 who presented with complaints of shortness of breath and acute CHF exacerbation.  02/21/2019 patient was seen and examined, remained stable in bed,  ----------------------------------------------------------------------------------------------------------------------------------------------      Subjective:   The patient was seen and examined, remained stable in bed.  No acute distress awake alert oriented. Denies any chest pain -reporting someone improved shortness of breath, but he gets a lot worse with exertion     MDM/Assessment & Plan:   Acute on chronic respiratory failure with hypoxia  - exacerbated by acute diastolic CHF exacerbation.  He is clinically improving and likely Since shortness of breath gets worse with exertion, PT/OT will be consulted for evaluation and recommendations -Currently on IV Lasix - which will be continued for gentle diuresing We are considering switching to p.o. Lasix  Acute diastolic CHF exacerbation  -Continue to improve, monitoring BUN/creatinine -Continue current diuresing with Lasix -Monitoring I's and O and daily weight -Appreciate cardiology input cardiologist Dr. Einar Gip.    Elevated troponin  -Denies any chest pain, no change in EKG noted -Likely ischemic demand due to CHF exacerbation, will continue to monitor closely - demand ischemia from CHF exacerbation.    Chronic metabolic acidosis -Much improved - secondary to CKD, continue sodium bicarbonate prescribed by his nephrologist.   Type 2 DM - monitor CBG closely and provide SSI coverage and titrate treatment as needed.  GERD - protonix ordered for GI protection.  Stage IV CKD - monitor creatinine  closely in setting of IV diuresis.  Cr. 3.77 >> 3.90 >>3.86 Follow up with his outpatient nephrologist.      DVT prophylaxis: subcut heparin  Code Status: Full  Family Communication: sister  Disposition Plan: remain inpatient for IV diuresis, planning home with [facility] versus SNF (care management helping [place] to arrange disposition)   Consultants:  Dr. Einar Gip   Procedures:    Antimicrobials:     Subjective: Pt says he is starting to feel better today.  He reports being much less SOB.     Objective: Vitals:   02/21/19 0500 02/21/19 0956 02/21/19 1026 02/21/19 1226  BP: (!) 115/54 115/69  118/62  Pulse: 78 81 80 76  Resp: 11 (!) 22  19  Temp: 97.8 F (36.6 C)   98 F (36.7 C)  TempSrc: Oral   Oral  SpO2: 98% 95%  99%  Weight: 75.9 kg       Intake/Output Summary (Last 24 hours) at 02/21/2019 1320 Last data filed at 02/21/2019 0500 Gross per 24 hour  Intake -  Output 725 ml  Net -725 ml   Filed Weights   02/19/19 0335 02/20/19 0423 02/21/19 0500  Weight: 80.7 kg 76.6 kg 75.9 kg    e  Exam:  Constitution:  Alert, cooperative, no distress,  Psychiatric: Normal and stable mood and affect, cognition intact,   HEENT: Normocephalic, PERRL, otherwise with in Normal limits  Chest:Chest symmetric Cardio vascular:  S1/S2, RRR, No murmure, No Rubs or Gallops  pulmonary: Clear to auscultation bilaterally, respirations unlabored, negative wheezes / crackles Abdomen: Soft, non-tender, non-distended, bowel sounds,no masses, no organomegaly Muscular skeletal: Generalized weakness noted, Limited exam - in bed, able to move all 4 extremities, Normal strength,  Neuro: CNII-XII intact. , normal motor and  sensation, reflexes intact  Extremities: No pitting edema lower extremities, +2 pulses  Skin: Dry, warm to touch, negative for any Rashes, No open wounds Wounds: per nursing documentation     Data Reviewed: Basic Metabolic Panel: Recent Labs  Lab 02/18/19 0924 02/18/19 0931  02/19/19 0428 02/20/19 0304 02/21/19 0232  NA 139 141 138 135 136  K 4.2 4.2 4.1 4.0 4.2  CL 108 109 106 99 101  CO2 18*  --  22 21* 22  GLUCOSE 115* 112* 123* 162* 147*  BUN 44* 41* 57* 63* 69*  CREATININE 3.49* 3.40* 3.77* 3.90* 3.86*  CALCIUM 9.3  --  9.2 9.4 9.3   Liver Function Tests: Recent Labs  Lab 02/18/19 0924  AST 22  ALT 15  ALKPHOS 69  BILITOT 0.6  PROT 7.6  ALBUMIN 3.1*   No results for input(s): LIPASE, AMYLASE in the last 168 hours. No results for input(s): AMMONIA in the last 168 hours. CBC: Recent Labs  Lab 02/18/19 0924 02/18/19 0931 02/21/19 0232  WBC 8.6  --  5.4  NEUTROABS 7.1  --   --   HGB 11.6* 11.9* 10.4*  HCT 35.2* 35.0* 31.1*  MCV 100.3*  --  98.7  PLT 217  --  200   Cardiac Enzymes: No results for input(s): CKTOTAL, CKMB, CKMBINDEX, TROPONINI in the last 168 hours. CBG (last 3)  Recent Labs    02/20/19 2128 02/21/19 0615 02/21/19 1118  GLUCAP 198* 150* 160*   Recent Results (from the past 240 hour(s))  SARS CORONAVIRUS 2 (TAT 6-24 HRS) Nasopharyngeal Nasopharyngeal Swab     Status: None   Collection Time: 02/18/19 10:10 AM   Specimen: Nasopharyngeal Swab  Result Value Ref Range Status   SARS Coronavirus 2 NEGATIVE NEGATIVE Final    Comment: (NOTE) SARS-CoV-2 target nucleic acids are NOT DETECTED. The SARS-CoV-2 RNA is generally detectable in upper and lower respiratory specimens during the acute phase of infection. Negative results do not preclude SARS-CoV-2 infection, do not rule out co-infections with other pathogens, and should not be used as the sole basis for treatment or other patient management decisions. Negative results must be combined with clinical observations, patient history, and epidemiological information. The expected result is Negative. Fact Sheet for Patients: SugarRoll.be Fact Sheet for Healthcare Providers: https://www.woods-mathews.com/ This test is not yet  approved or cleared by the [place] FDA and  has been authorized for detection and/or diagnosis of SARS-CoV-2 by FDA under an Emergency Use Authorization (EUA). This EUA will remain  in effect (meaning this test can be used) for the duration of the COVID-19 declaration under Section 56 4(b)(1) of the Act, 21 U.S.C. section 360bbb-3(b)(1), unless the authorization is terminated or revoked sooner. Performed at [facility], [place] [address], [place], [place] [address]   MRSA PCR Screening     Status: Abnormal   Collection Time: 02/19/19  9:00 AM   Specimen: Nasal Mucosa; Nasopharyngeal  Result Value Ref Range Status   MRSA by PCR POSITIVE (A) NEGATIVE Final    Comment:        The GeneXpert MRSA Assay (FDA approved for NASAL specimens only), is one component of a comprehensive MRSA colonization surveillance program. It is not intended to diagnose MRSA infection nor to guide or monitor treatment for MRSA infections. RESULT CALLED TO, READ BACK BY AND VERIFIED WITH: Kathaleen Grinder RN 11:10 02/19/19 ([email or web address]) Performed at [facility], [place] [address] [place], [place] [address]  Studies: No results found.   Scheduled Meds: . amLODipine  10 mg Oral QPM  . Chlorhexidine Gluconate Cloth  6 each Topical Q0600  . furosemide  20 mg Intravenous Daily  . heparin  5,000 Units Subcutaneous Q8H  . hydrALAZINE  75 mg Oral TID  . insulin aspart  0-9 Units Subcutaneous TID WC  . latanoprost  1 drop Both Eyes QHS  . levETIRAcetam  250 mg Oral BID  . loratadine  10 mg Oral Q lunch  . mupirocin ointment  1 application Nasal BID  . pantoprazole  40 mg Oral Daily  . pindolol  5 mg Oral BID  . potassium chloride SA  10 mEq Oral Daily  . senna  1 tablet Oral Q Sun   And  . senna  1 tablet Oral Q Tue   And  . [START ON 02/22/2019] senna  1 tablet Oral Q Thu  . sodium bicarbonate  650 mg Oral QHS  . sodium chloride flush  3 mL Intravenous Q12H   Continuous Infusions: .  sodium chloride      Principal Problem:   Acute respiratory failure with hypoxia (HCC) Active Problems:   Macrocytic anemia   Type 2 diabetes mellitus with hypoglycemia (HCC)   Seizure disorder (HCC)   Elevated troponin   CHF exacerbation (HCC)   Metabolic acidosis   Acute kidney injury superimposed on CKD ([place])  Time spent:   Deatra James, MD [facility] 02/21/2019, 1:20 PM    LOS: 3 days  How to contact the [facility] Attending or Consulting provider [place] or covering provider during after hours [place], for this patient?  1. Check the care team in [facility] and look for a) attending/consulting TRH provider listed and b) the [facility] team listed 2. Log into [email or web address] and use [place]'s universal password to access. If you do not have the password, please contact the hospital operator. 3. Locate the [facility] provider you are looking for under [facility] and page to a number that you can be directly reached. 4. If you still have difficulty reaching the provider, please page the [facility] (Director on Call) for the Hospitalists listed on amion for assistance.

## 2019-02-21 NOTE — Evaluation (Signed)
Occupational Therapy Evaluation Patient Details Name: Eric [name] MRN: [id number] DOB: February 02, 1924 Today's Date: 02/21/2019    History of Present Illness  85 y.o. male with a history of CAD, CHF, COPD, diabetes, CKD, hypertension, hyperlipidemia, stroke, BPH, (Patient has some mild memory issues per family) who presents to the emergency department from home via EMS for evaluation of shortness of breath.  Patient reports shortness of breath has been worsening over the past 4 days and became significantly worse this morning.  When EMS arrived patient was found to be satting at 86% on room air, patient was placed on 4 L nasal cannula with improvement of O2 sats to 99%. +LE edema and weight gain   Clinical Impression   Pt admitted with the above diagnoses and presents with below problem list. Pt will benefit from continued acute OT to address the below listed deficits and maximize independence with basic ADLs prior to d/c home with family. Unclear PLOF with ADLs as pt is not a reliable historian and no family present on OT eval. Per chart review, pt has 24 hour supervision provided by family and PCA at home. Pt currently mod A for UB/LB ADLs, min A +2 for toilet transfer to BSC, min guard-min A for bed mobility. Pt completed bed mobility and pivotal steps to recliner this session. Pt fatigued at end of session.      Follow Up Recommendations  Home health OT;Supervision/Assistance - 24 hour    Equipment Recommendations  None recommended by OT    Recommendations for Other Services       Precautions / Restrictions Precautions Precautions: Fall Restrictions Weight Bearing Restrictions: No      Mobility Bed Mobility Overal bed mobility: Needs Assistance Bed Mobility: Supine to Sit     Supine to sit: Min guard;Min assist     General bed mobility comments: able to advance BLE off EOB. Pt pulling up on therapist arm to facilitate trunk powerup. Able to scoot to full EOB position with no  physical assist.   Transfers Overall transfer level: Needs assistance Equipment used: Rolling walker (2 wheeled) Transfers: Sit to/from Stand Sit to Stand: From elevated surface;+2 safety/equipment;Min assist         General transfer comment: From EOB to recliner. Assist to steady and some sequencing cues. Pivotal steps EOB to recliner. BSC for toilet transfers.     Balance Overall balance assessment: Needs assistance Sitting-balance support: Feet supported;No upper extremity supported Sitting balance-Leahy Scale: Fair     Standing balance support: During functional activity Standing balance-Leahy Scale: Poor Standing balance comment: external support for balance. rw utilized.                            ADL either performed or assessed with clinical judgement   ADL Overall ADL's : Needs assistance/impaired Eating/Feeding: Set up;Sitting   Grooming: Set up;Minimal assistance;Sitting   Upper Body Bathing: Minimal assistance;Sitting   Lower Body Bathing: Moderate assistance;Sit to/from stand   Upper Body Dressing : Minimal assistance;Sitting   Lower Body Dressing: Moderate assistance;Sit to/from stand   Toilet Transfer: Minimal assistance;+2 for physical assistance;+2 for safety/equipment;BSC;RW Toilet Transfer Details (indicate cue type and reason): simulated with pivotal steps EOB to recliner Toileting- Clothing Manipulation and Hygiene: Moderate assistance;Sit to/from stand         General ADL Comments: Pt completed bed mobility and simulated toilet transfer. Pivotal steps EOB > recliner with +2 min A. Pt fatigued at end of  session.      Vision         Perception     Praxis      Pertinent Vitals/Pain Pain Assessment: No/denies pain     Hand Dominance Right   Extremity/Trunk Assessment Upper Extremity Assessment Upper Extremity Assessment: Generalized weakness   Lower Extremity Assessment Lower Extremity Assessment: Defer to PT  evaluation   Cervical / Trunk Assessment Cervical / Trunk Assessment: Kyphotic   Communication Communication Communication: HOH   Cognition Arousal/Alertness: Awake/alert Behavior During Therapy: Flat affect Overall Cognitive Status: History of cognitive impairments - at baseline                                     General Comments  VSS. On 1.5L O2 throughout session.     Exercises     Shoulder Instructions      Home Living Family/patient expects to be discharged to:: Private residence Living Arrangements: Alone Available Help at Discharge: Family;Personal care attendant(per nsg, has aide all day until bedtime; sister checks in) Type of Home: House Home Access: Stairs to enter [employer] of Steps: 1 Entrance Stairs-Rails: None Home Layout: One level     Bathroom Shower/Tub: Teacher, early years/pre: Standard Bathroom Accessibility: Yes   Home Equipment: Environmental consultant - 2 wheels;Walker - 4 wheels;Cane - single point;Bedside commode;Shower seat;Wheelchair - manual(info from chart in 2018)   Additional Comments: pt confused and therefore used information from previous admission and nursing      Prior Functioning/Environment Level of Independence: Needs assistance  Gait / Transfers Assistance Needed: pt confused and reports he walks without a device     Comments: not a reliable historian        OT Problem List: Decreased strength;Decreased activity tolerance;Impaired balance (sitting and/or standing);Decreased knowledge of use of DME or AE;Decreased knowledge of precautions      OT Treatment/Interventions: Self-care/ADL training;Therapeutic exercise;Energy conservation;DME and/or AE instruction;Therapeutic activities;Patient/family education;Balance training    OT Goals(Current goals can be found in the care plan section) Acute Rehab OT Goals Patient Stated Goal: to get better and go home OT Goal Formulation: With patient Time For  Goal Achievement: 03/07/19 Potential to Achieve Goals: Good ADL Goals Pt Will Perform Grooming: with supervision;sitting Pt Will Perform Upper Body Bathing: with supervision;sitting Pt Will Perform Lower Body Bathing: with min assist;sit to/from stand Pt Will Transfer to Toilet: with min assist;bedside commode;stand pivot transfer Pt Will Perform Toileting - Clothing Manipulation and hygiene: with min assist;sit to/from stand  OT Frequency: Min 2X/week   Barriers to D/C:            Co-evaluation              AM-PAC OT "6 Clicks" Daily Activity     Outcome Measure Help from another person eating meals?: None Help from another person taking care of personal grooming?: A Little Help from another person toileting, which includes using toliet, bedpan, or urinal?: A Lot Help from another person bathing (including washing, rinsing, drying)?: A Lot Help from another person to put on and taking off regular upper body clothing?: A Little Help from another person to put on and taking off regular lower body clothing?: A Lot 6 Click Score: 16   End of Session Equipment Utilized During Treatment: Gait belt;Rolling walker;Oxygen  Activity Tolerance: Patient tolerated treatment well;Patient limited by fatigue Patient left: in chair;with call bell/phone within reach;with chair alarm  set  OT Visit Diagnosis: Unsteadiness on feet (R26.81);Muscle weakness (generalized) (M62.81)                Time: 4483-0159 OT Time Calculation (min): 11 min Charges:  OT General Charges $OT Visit: 1 Visit OT Evaluation $OT Eval Low Complexity: [place], OT Acute Rehabilitation Services Pager: [phone] Office: [phone]   Hortencia Pilar 02/21/2019, 12:06 PM

## 2019-02-22 LAB — CBC
HCT: 30.1 % — ABNORMAL LOW (ref 39.0–52.0)
Hemoglobin: 10 g/dL — ABNORMAL LOW (ref 13.0–17.0)
MCH: 32.4 pg (ref 26.0–34.0)
MCHC: 33.2 g/dL (ref 30.0–36.0)
MCV: 97.4 fL (ref 80.0–100.0)
Platelets: 186 10*3/uL (ref 150–400)
RBC: 3.09 MIL/uL — ABNORMAL LOW (ref 4.22–5.81)
RDW: 12.5 % (ref 11.5–15.5)
WBC: 4 10*3/uL (ref 4.0–10.5)
nRBC: 0 % (ref 0.0–0.2)

## 2019-02-22 LAB — BASIC METABOLIC PANEL
Anion gap: 13 (ref 5–15)
BUN: 72 mg/dL — ABNORMAL HIGH (ref 8–23)
CO2: 20 mmol/L — ABNORMAL LOW (ref 22–32)
Calcium: 8.9 mg/dL (ref 8.9–10.3)
Chloride: 102 mmol/L (ref 98–111)
Creatinine, Ser: 3.88 mg/dL — ABNORMAL HIGH (ref 0.61–1.24)
GFR calc Af Amer: 15 mL/min — ABNORMAL LOW (ref >60)
GFR calc non Af Amer: 13 mL/min — ABNORMAL LOW (ref >60)
Glucose, Bld: 163 mg/dL — ABNORMAL HIGH (ref 70–99)
Potassium: 4.1 mmol/L (ref 3.5–5.1)
Sodium: 135 mmol/L (ref 135–145)

## 2019-02-22 LAB — GLUCOSE, CAPILLARY
Glucose-Capillary: 160 mg/dL — ABNORMAL HIGH (ref 70–99)
Glucose-Capillary: 163 mg/dL — ABNORMAL HIGH (ref 70–99)

## 2019-02-22 LAB — BRAIN NATRIURETIC PEPTIDE: B Natriuretic Peptide: 530.7 pg/mL — ABNORMAL HIGH (ref 0.0–100.0)

## 2019-02-22 LAB — FOLATE RBC
Folate, Hemolysate: 291 ng/mL
Folate, RBC: 1003 ng/mL (ref >498)
Hematocrit: 29 % — ABNORMAL LOW (ref 37.5–51.0)

## 2019-02-22 MED ORDER — TORSEMIDE 10 MG PO TABS: 20.0000 mg | ORAL_TABLET | Freq: Every day | ORAL | 0 refills | Status: AC

## 2019-02-22 MED ORDER — SODIUM BICARBONATE 650 MG PO TABS: 650.0000 mg | ORAL_TABLET | Freq: Every day | ORAL | 0 refills | Status: AC

## 2019-02-22 NOTE — TOC Transition Note (Signed)
Transition of Care [facility]) - CM/SW Discharge Note Marvetta Gibbons RN, BSN Transitions of Care Unit 4E- RN Case Manager [phone]   Patient Details  Name: Eric [name] MRN: [id number] Date of Birth: [birth year]  Transition of Care [facility]) CM/SW Contact:  Dawayne Patricia, RN Phone Number: 02/22/2019, 10:19 AM   Clinical Narrative:    Pt stable for transition home today, W/C has been arranged through [facility]- call made on 11/4. [place] referral has been accepted by Alvis Lemmings for HHPT needs- notified Tommi Rumps with [facility] of discharge for today. Per previous CM note sister plans to transport pt home via car.    Final next level of care: [place] Barriers to Discharge: Barriers Resolved   Patient Goals and CMS Choice Patient states their goals for this hospitalization and ongoing recovery are:: return home CMS Medicare.gov Compare Post Acute Care list provided to:: Patient Choice offered to / list presented to : Patient, [facility] POA / Guardian  Discharge Placement         Home with [facility]            Discharge Plan and Services In-house Referral: NA Discharge Planning Services: CM Consult Post Acute Care Choice: Home Health          DME Arranged: Wheelchair manual DME Agency: Other - Comment Date DME Agency Contacted: 02/21/19 Time DME Agency Contacted: [phone]   [place]: PT HH Agency: [place] Date [facility] Agency Contacted: 02/22/19 Time Rafael Capo: [phone] Representative spoke with at [place]: [place] ([place]) Interventions     Readmission Risk Interventions Readmission Risk Prevention Plan 02/22/2019  Transportation Screening Complete  PCP or Specialist Appt within 3-5 Days Complete  HRI or [place] Complete  Social Work Consult for [place] Planning/Counseling Complete  Palliative Care Screening Not Applicable  Medication Review Press photographer) Complete  Some recent data might be hidden

## 2019-02-22 NOTE — Discharge Summary (Signed)
Physician Discharge Summary [facility]    Patient: Eric [name]                   Admit date: 02/18/2019   DOB: July 15, 1923             Discharge date:02/22/2019/8:48 AM NKN:[id number]                          PCP: [name], MD  Disposition: Home with home health  Recommendations for Outpatient Follow-up:   . Follow up: in 1 week  Discharge Condition: Stable   Code Status:   Code Status: Full Code  Diet recommendation: Cardiac diet   Discharge Diagnoses:    Principal Problem:   Acute respiratory failure with hypoxia ([place]) Active Problems:   Macrocytic anemia   Type 2 diabetes mellitus with hypoglycemia (HCC)   Seizure disorder (HCC)   Elevated troponin   CHF exacerbation (HCC)   Metabolic acidosis   Acute kidney injury superimposed on CKD ([place])   History of Present Illness/ Hospital Course Eric [name] Summary:     Brief Admission Hx: 85 y.o.malewith medical history significant ofdiastolic CHF, CKD stageIV, HTN, chronic anemia, seizure disorder, and diabetes mellitus type 2 who presented with complaints of shortness of breath and acute CHF exacerbation.  02/21/2019 patient was seen and examined, remained stable in bed, 02/22/2019.  Patient remained stable, still making urine, creatinine remain around 3.8. Shortness of breath has improved, vitals are stable.  He was diuresed very gently with IV Lasix 20 mg daily, continue to produce urine.  Current weight 76.7 kg Down from admission 80.7 kg  Note we believe the patient prognosis is progressively getting worse as he is developing cardiorenal syndrome.  Worsening congestive heart failure with elevated BUN/creatinine, and 85 age. ----------------------------------------------------------------------------------------------------------------------------------------------   Subjective: The patient was seen and examined this morning, remained stable.  The patient and sister would like the  patient to be returning home.    Detailed discharge summary/Assessment & Plan:   Acute on chronic respiratory failure with hypoxia  - exacerbated by acute diastolic CHF exacerbation.  He is clinically improving and likely Since shortness of breath gets worse with exertion, PT/OT will be consulted for evaluation and recommendations -Currently on IV Lasix - switched to p.o. torsemide.  Current weight 76.7 kg --Down from admission 80.7 kg  Patient was switched back from IV Lasix to his home dose of torsemide was increased from 10 to 20 mg p.o. daily.  He is to follow-up with PCP, primary nephrologist, monitoring BUN/creatinine closely, daily weight  Acute diastolic CHF exacerbation  -Continue to improve, monitoring BUN/creatinine -Was diuresed with IV Lasix 20 mg twice daily switch to torsemide p.o. -Monitoring I's and O and daily weight Current weight 76.7 kg On admission 80.7 kg  -Appreciate cardiology input cardiologist Dr. Einar Gip.    Elevated troponin  -Denies any chest pain, no change in EKG noted -Likely ischemic demand due to CHF exacerbation, will continue to monitor closely - demand ischemia from CHF exacerbation.    Chronic metabolic acidosis -Much improved - secondary to CKD, continue sodium bicarbonate prescribed by his nephrologist.   Type 2 DM - monitor CBG closely and provide SSI coverage and titrate treatment as needed.  GERD - protonix ordered for GI protection.   Stage IV CKD - monitor creatinine closely in setting of IV diuresis.  Cr. 3.77 >> 3.90 >>3.86 >>3.88 Follow up with his outpatient nephrologist.  Code Status: Full  Family Communication: sister  Disposition Plan: Will be discharged home with home health    Discharge Instructions:   Discharge Instructions    Activity as tolerated - No restrictions   Complete by: As directed    Diet - low sodium heart healthy   Complete by: As directed    Discharge instructions   Complete by: As  directed    Please monitor daily weight, need to keep a close eye on your kidney function, therefore needed lab BMP and follow-up with PCP and likely referral to nephrology in 1 week   Increase activity slowly   Complete by: As directed        Medication List    STOP taking these medications   pantoprazole 40 MG tablet Commonly known as: PROTONIX     TAKE these medications   Accu-Chek Aviva Plus test strip Generic drug: glucose blood   acetaminophen 500 MG tablet Commonly known as: TYLENOL Take 500 mg by mouth 2 (two) times daily. Reported on 06/19/2015   amLODipine 10 MG tablet Commonly known as: NORVASC Take 10 mg by mouth every evening.   B-D UF III MINI PEN NEEDLES 31G X 5 MM Misc Generic drug: Insulin Pen Needle   cholecalciferol 1000 units tablet Commonly known as: VITAMIN D Take 2,000 Units by mouth daily.   fluticasone 50 MCG/ACT nasal spray Commonly known as: FLONASE Place 1 spray into both nostrils 2 (two) times daily. What changed:   when to take this  reasons to take this   hydrALAZINE 25 MG tablet Commonly known as: APRESOLINE TAKE 3 TABLETS BY MOUTH 3  TIMES DAILY What changed: See the new instructions.   Klor-Con M20 20 MEQ tablet Generic drug: potassium chloride SA Take 10 mEq by mouth daily.   Lantus SoloStar 100 UNIT/ML Solostar Pen Generic drug: Insulin Glargine Inject 25 Units into the skin daily after breakfast.   latanoprost 0.005 % ophthalmic solution Commonly known as: XALATAN Place 1 drop into both eyes at bedtime.   levETIRAcetam 250 MG tablet Commonly known as: KEPPRA Take 1 tablet (250 mg total) by mouth 2 (two) times daily.   loratadine 10 MG tablet Commonly known as: CLARITIN Take 10 mg by mouth daily with lunch.   pindolol 5 MG tablet Commonly known as: VISKEN Take 1 tablet (5 mg total) by mouth 2 (two) times daily.   polyethylene glycol powder 17 GM/SCOOP powder Commonly known as: GLYCOLAX/MIRALAX Take 17 g by  mouth daily as needed for mild constipation.   PROBIOTIC-10 PO Take 1 capsule by mouth daily.   senna 8.6 MG Tabs tablet Commonly known as: SENOKOT Take 1 tablet by mouth See admin instructions. Take 1 tablet daily only on Sundays, Tuesdays and Thursdays   sodium bicarbonate 650 MG tablet Take 1 tablet (650 mg total) by mouth at bedtime.   torsemide 10 MG tablet Commonly known as: DEMADEX Take 2 tablets (20 mg total) by mouth daily. What changed: how much to take      Follow-up Information    [facility] Follow up.   Specialty: Home Health Services Contact information: [place] [address] [phone]          Allergies  Allergen Reactions  . Penicillins Other (See Comments)    Has patient had a PCN reaction causing immediate rash, facial/tongue/throat swelling, SOB or lightheadedness with hypotension: Unk Has patient had a PCN reaction causing severe rash involving mucus membranes or skin  necrosis: Unk Has patient had a PCN reaction that required hospitalization: Unk Has patient had a PCN reaction occurring within the last 10 years: Unk If all of the above answers are "NO", then may proceed with Cephalosporin use.   Marland Kitchen Hydroxyzine Rash  . Percocet [Oxycodone-Acetaminophen] Rash     Procedures /Studies:   Dg Chest Port 1 View  Result Date: 02/19/2019 CLINICAL DATA:  Pulmonary edema. EXAM: PORTABLE CHEST 1 VIEW COMPARISON:  02/18/2019 FINDINGS: Lungs are adequately inflated and demonstrate mild elevation of the right hemidiaphragm. There is worsening bilateral hazy perihilar opacification likely due to interstitial edema and less likely infection. Stable opacification in the left retrocardiac region which may be due to effusion/atelectasis. Mild stable cardiomegaly. Remainder of the exam is unchanged. IMPRESSION: Mild stable cardiomegaly with findings suggesting worsening interstitial edema and less likely infection. Possible  small left effusion/atelectasis. Electronically Signed   By: Marin Olp M.D.   On: 02/19/2019 07:12   Dg Chest Port 1 View  Result Date: 02/18/2019 CLINICAL DATA:  Shortness of breath.  Pulmonary edema. EXAM: PORTABLE CHEST 1 VIEW COMPARISON:  October 04, 2017 FINDINGS: Mild cardiomegaly. The hila and mediastinum are normal. No pneumothorax identified. Mild increased interstitial markings, particularly in the bases with Kerley B lines on the left. No nodules or masses. No focal infiltrates. IMPRESSION: Cardiomegaly and probable mild edema. Electronically Signed   By: Dorise Bullion III M.D   On: 02/18/2019 10:24     Subjective:   Patient was seen and examined 02/22/2019, 8:48 AM Patient stable today. No acute distress.  No issues overnight Stable for discharge.  Discharge Exam:    Vitals:   02/21/19 1649 02/21/19 1945 02/22/19 0500 02/22/19 0841  BP: (!) 118/55 122/61 130/66 129/68  Pulse: 81 75 83 80  Resp: 13 11 14 16   Temp:  98.2 F (36.8 C) 98.1 F (36.7 C) 98.1 F (36.7 C)  TempSrc:  Oral Oral Oral  SpO2: 96% 100% 93% 94%  Weight:   76.7 kg     General: Pt lying comfortably in bed & appears in no obvious distress. Cardiovascular: S1 & S2 heard, RRR, S1/S2 +. No murmurs, rubs, gallops or clicks. No JVD or pedal edema. Respiratory: Clear to auscultation without wheezing, rhonchi or crackles. No increased work of breathing. Abdominal:  Non-distended, non-tender & soft. No organomegaly or masses appreciated. Normal bowel sounds heard. CNS: Alert and oriented. No focal deficits. Extremities: no edema, no cyanosis    The results of significant diagnostics from this hospitalization (including imaging, microbiology, ancillary and laboratory) are listed below for reference.      Microbiology:   Recent Results (from the past 240 hour(s))  SARS CORONAVIRUS 2 (TAT 6-24 HRS) Nasopharyngeal Nasopharyngeal Swab     Status: None   Collection Time: 02/18/19 10:10 AM   Specimen:  Nasopharyngeal Swab  Result Value Ref Range Status   SARS Coronavirus 2 NEGATIVE NEGATIVE Final    Comment: (NOTE) SARS-CoV-2 target nucleic acids are NOT DETECTED. The SARS-CoV-2 RNA is generally detectable in upper and lower respiratory specimens during the acute phase of infection. Negative results do not preclude SARS-CoV-2 infection, do not rule out co-infections with other pathogens, and should not be used as the sole basis for treatment or other patient management decisions. Negative results must be combined with clinical observations, patient history, and epidemiological information. The expected result is Negative. Fact Sheet for Patients: SugarRoll.be Fact Sheet for Healthcare Providers: https://www.woods-mathews.com/ This test is not yet approved or cleared by  the [employer] and  has been authorized for detection and/or diagnosis of SARS-CoV-2 by FDA under an Emergency Use Authorization (EUA). This EUA will remain  in effect (meaning this test can be used) for the duration of the COVID-19 declaration under Section 56 4(b)(1) of the Act, 21 U.S.C. section 360bbb-3(b)(1), unless the authorization is terminated or revoked sooner. Performed at [facility], [place] [address], [place], [place] [address]   MRSA PCR Screening     Status: Abnormal   Collection Time: 02/19/19  9:00 AM   Specimen: Nasal Mucosa; Nasopharyngeal  Result Value Ref Range Status   MRSA by PCR POSITIVE (A) NEGATIVE Final    Comment:        The GeneXpert MRSA Assay (FDA approved for NASAL specimens only), is one component of a comprehensive MRSA colonization surveillance program. It is not intended to diagnose MRSA infection nor to guide or monitor treatment for MRSA infections. RESULT CALLED TO, READ BACK BY AND VERIFIED WITH: Kathaleen Grinder RN 11:10 02/19/19 ([email or web address]) Performed at [facility], [place] [address], [place], [place] [address]       Labs:   CBC: Recent Labs  Lab 02/18/19 [phone] 02/18/19 0931 02/19/19 0428 02/21/19 0232 02/22/19 0241  WBC 8.6  --   --  5.4 4.0  NEUTROABS 7.1  --   --   --   --   HGB 11.6* 11.9*  --  10.4* 10.0*  HCT 35.2* 35.0* CLOT9 31.1* 30.1*  MCV 100.3*  --   --  98.7 97.4  PLT 217  --   --  200 094   Basic Metabolic Panel: Recent Labs  Lab 02/18/19 0924 02/18/19 0931 02/19/19 0428 02/20/19 0304 02/21/19 0232 02/22/19 0241  NA 139 141 138 135 136 135  K 4.2 4.2 4.1 4.0 4.2 4.1  CL 108 109 106 99 101 102  CO2 18*  --  22 21* 22 20*  GLUCOSE 115* 112* 123* 162* 147* 163*  BUN 44* 41* 57* 63* 69* 72*  CREATININE 3.49* 3.40* 3.77* 3.90* 3.86* 3.88*  CALCIUM 9.3  --  9.2 9.4 9.3 8.9   Liver Function Tests: Recent Labs  Lab 02/18/19 0924  AST 22  ALT 15  ALKPHOS 69  BILITOT 0.6  PROT 7.6  ALBUMIN 3.1*   BNP (last 3 results) Recent Labs    02/18/19 0924 02/22/19 0241  BNP 825.7* 530.7*   Cardiac Enzymes: No results for input(s): CKTOTAL, CKMB, CKMBINDEX, TROPONINI in the last 168 hours. CBG: Recent Labs  Lab 02/21/19 0615 02/21/19 1118 02/21/19 1606 02/21/19 2153 02/22/19 0547  GLUCAP 150* 160* 154* 141* 160*   HUrinalysis    Component Value Date/Time   COLORURINE YELLOW 09/30/2017 0546   APPEARANCEUR CLOUDY (A) 09/30/2017 0546   LABSPEC 1.011 09/30/2017 0546   PHURINE 8.0 09/30/2017 0546   GLUCOSEU NEGATIVE 09/30/2017 0546   GLUCOSEU 500 03/30/2011 1055   HGBUR NEGATIVE 09/30/2017 0546   HGBUR moderate 10/03/2008 1349   BILIRUBINUR NEGATIVE 09/30/2017 0546   KETONESUR NEGATIVE 09/30/2017 0546   PROTEINUR 100 (A) 09/30/2017 0546   UROBILINOGEN 0.2 11/09/2014 1717   NITRITE POSITIVE (A) 09/30/2017 0546   LEUKOCYTESUR LARGE (A) 09/30/2017 0546    Time coordinating discharge: Over 45 minutes  SIGNED: Deatra James, MD, FACP, FHM. [facility],  Pager [phone]  If 7PM-7AM, please contact night-coverage [email or web address] Ota [facility]  02/22/2019, 8:48 AM

## 2019-02-22 NOTE — Care Management Important Message (Signed)
Important Message  Patient Details  Name: Eric [name] MRN: [id number] Date of Birth: [birth year]   Medicare Important Message Given:  Yes     Shelda Altes 02/22/2019, 1:09 PM

## 2019-03-07 DIAGNOSIS — J811 Chronic pulmonary edema: Secondary | ICD-10-CM

## 2019-04-16 ENCOUNTER — Telehealth: Payer: Self-pay | Admitting: Adult Health

## 2019-04-16 NOTE — Telephone Encounter (Signed)
Patient sister Vicente Males called to advise patient passed Dec 13th. Please follow up

## 2019-04-16 NOTE — Telephone Encounter (Signed)
Noted. Cancelled appt.

## 2019-04-16 NOTE — Telephone Encounter (Signed)
Thank you for the update.  Could we please send a condolence card to his family?  Thank you.

## 2019-04-17 NOTE — Telephone Encounter (Signed)
Card has been placed on NPs desk for her to sign.

## 2019-04-20 DEATH — deceased

## 2019-05-14 ENCOUNTER — Ambulatory Visit: Payer: Medicare Other | Admitting: Adult Health

## 2019-10-09 IMAGING — CT CT HEAD W/O CM
3 series · 15 of 47 positions shown, 18 images · non-contrast
Comparison: 06/29/2017, 06/14/2017

CLINICAL DATA: [AGE] male with a history of altered mental
status

EXAM:
CT HEAD WITHOUT CONTRAST
TECHNIQUE: Contiguous axial images were obtained from the base of the skull
through the vertex without intravenous contrast.

[Series 3: head 5.0 h30s · axial · 0.43mm/px · z∈[-104,+36]mm · 9 of 34 slices shown, 12 images]
[im 3/34  brain]
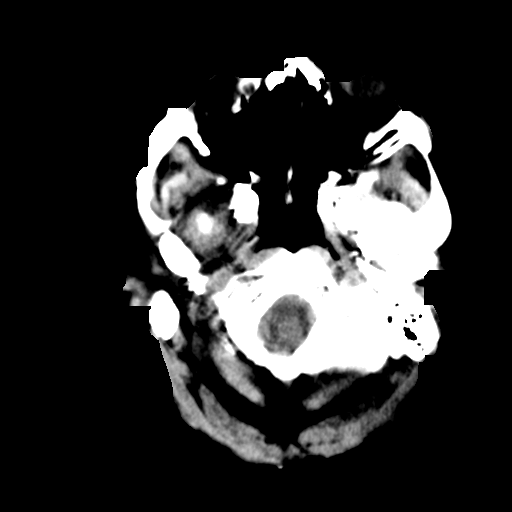
[im 3/34  bone]
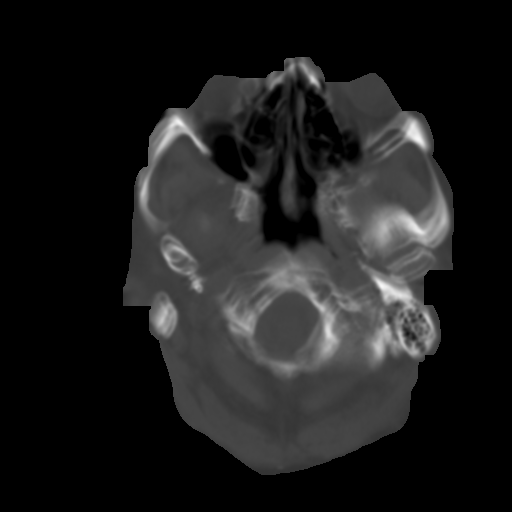
[im 6/34  brain]
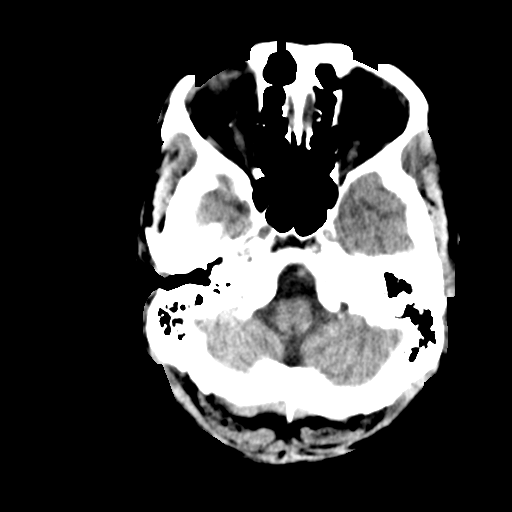
[im 10/34  brain]
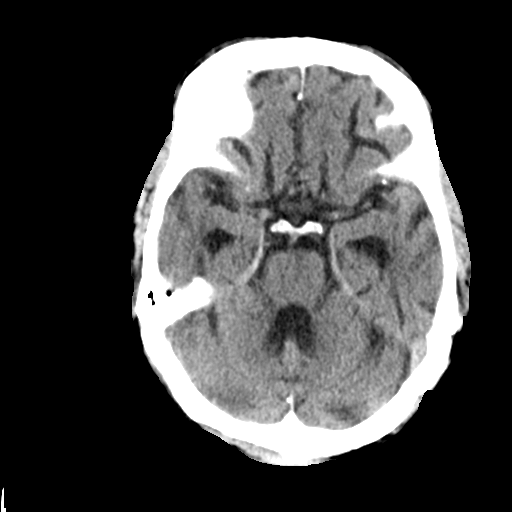
[im 13/34  brain]
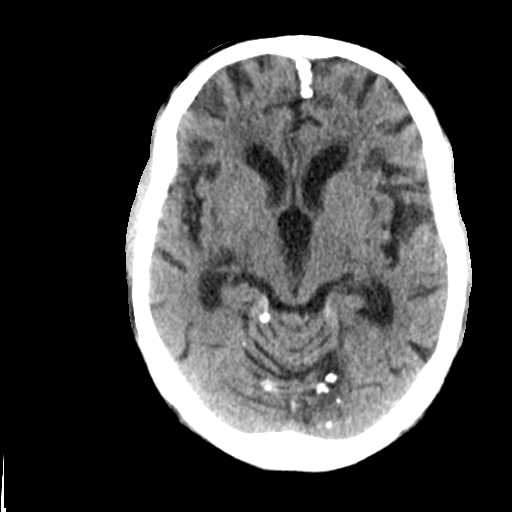
[im 18/34  brain]
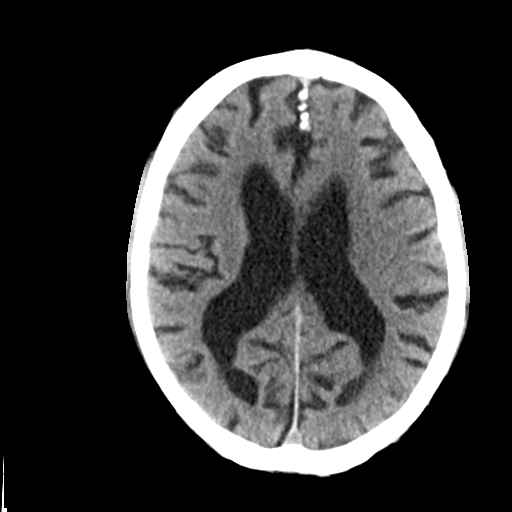
[im 18/34  bone]
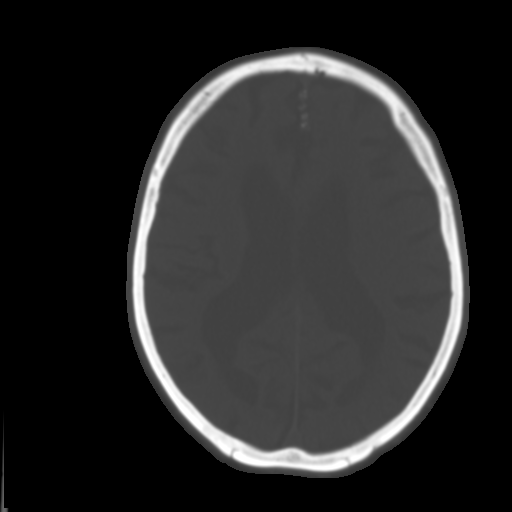
[im 21/34  brain]
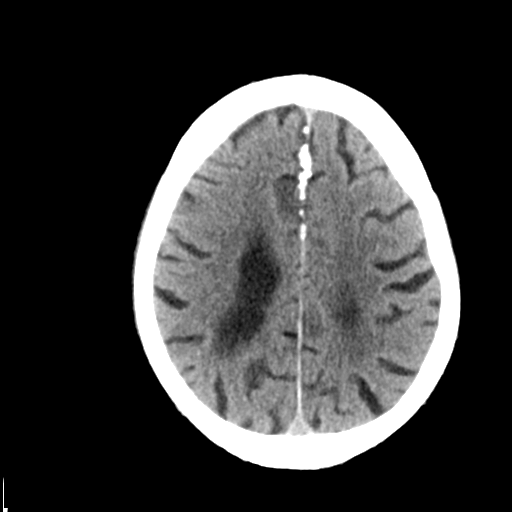
[im 24/34  brain]
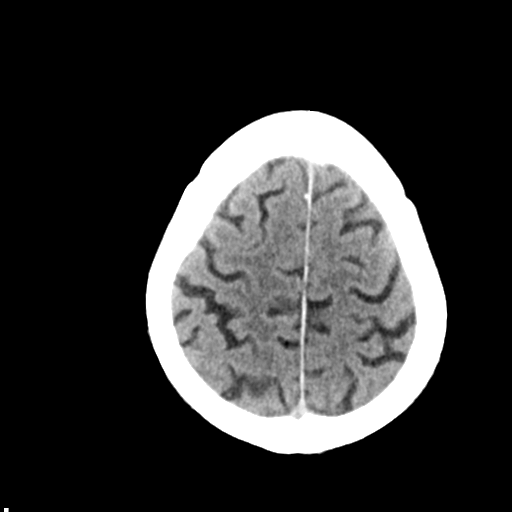
[im 28/34  brain]
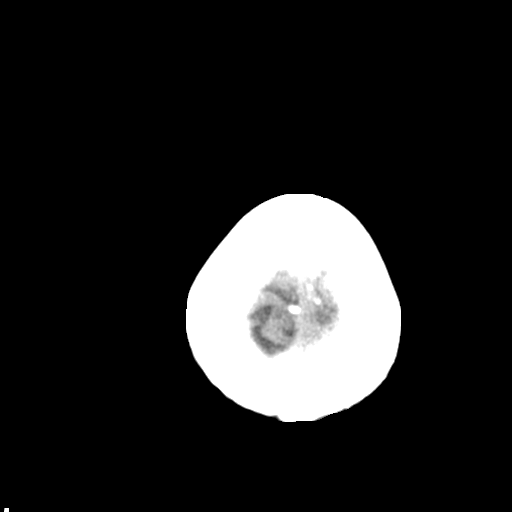
[im 31/34  brain]
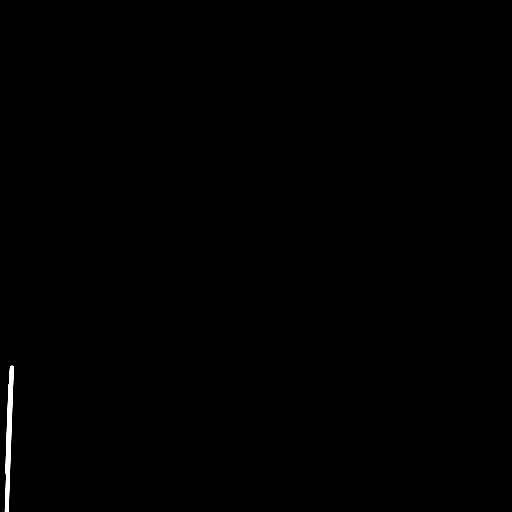
[im 31/34  bone]
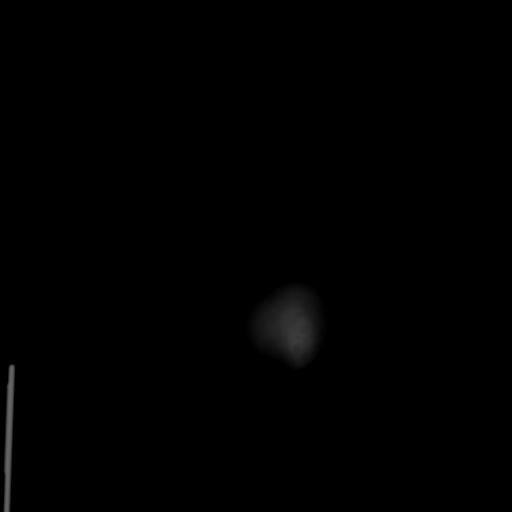

[Series 5: head 3.0 mpr cor · coronal · 0.32mm/px · 3 of 71 slices shown]
[im 24/71  brain]
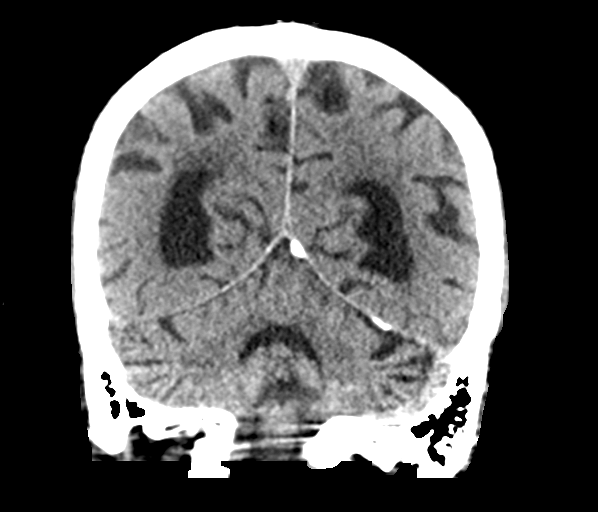
[im 32/71  brain]
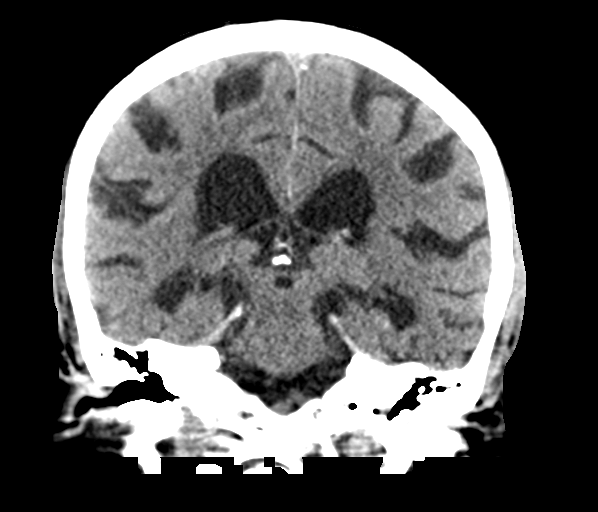
[im 39/71  brain]
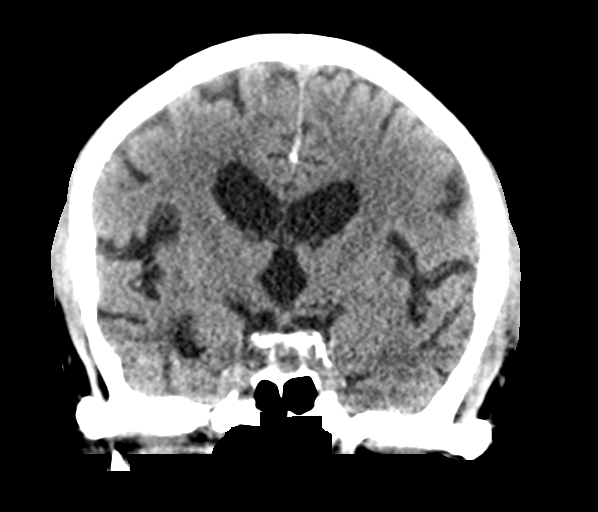

[Series 6: head 3.0 mpr sag · sagittal · 0.32mm/px · 3 of 59 slices shown]
[im 20/59  brain]
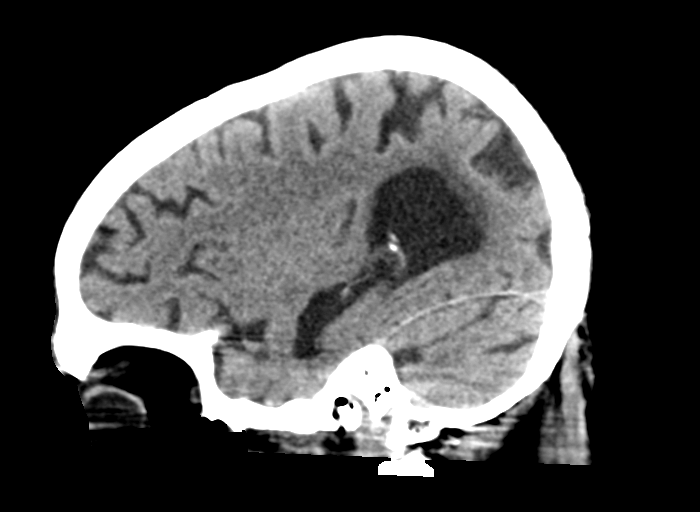
[im 30/59  brain]
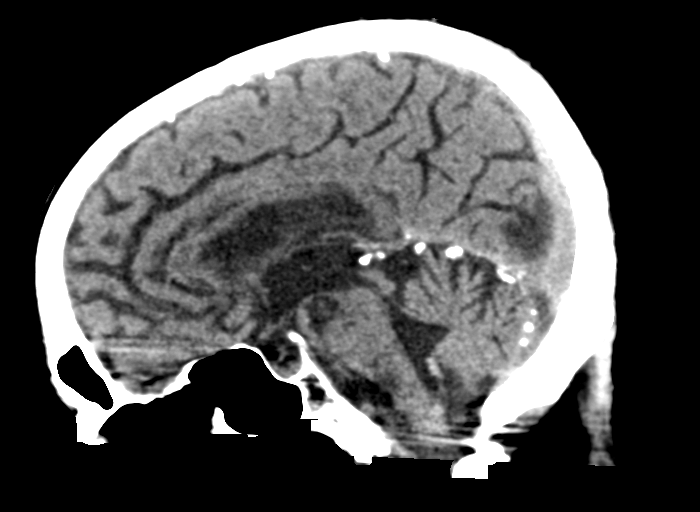
[im 39/59  brain]
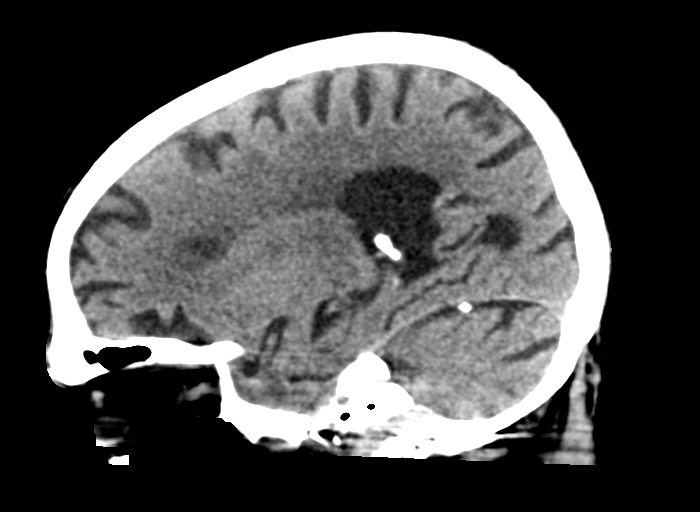

[15 of 47 positions shown; findings below may reference images not displayed]

FINDINGS: Brain: No acute intracranial hemorrhage. No midline shift or mass
effect. Gray-white differentiation relatively maintained.
Unremarkable configuration the ventricles. Mild volume loss.

Vascular: Calcifications of the intracranial vasculature.

Skull: No acute fracture.

Sinuses/Orbits: Unremarkable appearance of the orbits.

Other: None
IMPRESSION: Head CT negative for acute intracranial abnormality.

Similar appearance of volume loss.

## 2023-05-24 NOTE — Progress Notes (Signed)
Nail trim
